# Patient Record
Sex: Female | Born: 1965 | Hispanic: Yes | Marital: Married | Smoking: Never smoker
Health system: Southern US, Community
[De-identification: ages and names within clinical notes are randomized; demographics above are authoritative.]

## PROBLEM LIST (undated history)

## (undated) DIAGNOSIS — K589 Irritable bowel syndrome without diarrhea: Secondary | ICD-10-CM

## (undated) DIAGNOSIS — G8929 Other chronic pain: Secondary | ICD-10-CM

## (undated) DIAGNOSIS — R109 Unspecified abdominal pain: Secondary | ICD-10-CM

## (undated) DIAGNOSIS — M5416 Radiculopathy, lumbar region: Secondary | ICD-10-CM

## (undated) DIAGNOSIS — C3490 Malignant neoplasm of unspecified part of unspecified bronchus or lung: Secondary | ICD-10-CM

## (undated) DIAGNOSIS — IMO0002 Reserved for concepts with insufficient information to code with codable children: Secondary | ICD-10-CM

## (undated) DIAGNOSIS — R0600 Dyspnea, unspecified: Secondary | ICD-10-CM

## (undated) DIAGNOSIS — K279 Peptic ulcer, site unspecified, unspecified as acute or chronic, without hemorrhage or perforation: Secondary | ICD-10-CM

## (undated) DIAGNOSIS — E781 Pure hyperglyceridemia: Secondary | ICD-10-CM

## (undated) DIAGNOSIS — K219 Gastro-esophageal reflux disease without esophagitis: Secondary | ICD-10-CM

## (undated) DIAGNOSIS — F419 Anxiety disorder, unspecified: Secondary | ICD-10-CM

## (undated) DIAGNOSIS — E876 Hypokalemia: Secondary | ICD-10-CM

## (undated) DIAGNOSIS — I10 Essential (primary) hypertension: Secondary | ICD-10-CM

## (undated) DIAGNOSIS — R079 Chest pain, unspecified: Secondary | ICD-10-CM

## (undated) DIAGNOSIS — F32A Depression, unspecified: Secondary | ICD-10-CM

## (undated) DIAGNOSIS — G43909 Migraine, unspecified, not intractable, without status migrainosus: Secondary | ICD-10-CM

## (undated) DIAGNOSIS — M549 Dorsalgia, unspecified: Secondary | ICD-10-CM

## (undated) DIAGNOSIS — F329 Major depressive disorder, single episode, unspecified: Secondary | ICD-10-CM

## (undated) DIAGNOSIS — I639 Cerebral infarction, unspecified: Secondary | ICD-10-CM

## (undated) HISTORY — DX: Pure hyperglyceridemia: E78.1

## (undated) HISTORY — DX: Radiculopathy, lumbar region: M54.16

## (undated) HISTORY — DX: Reserved for concepts with insufficient information to code with codable children: IMO0002

## (undated) HISTORY — DX: Irritable bowel syndrome, unspecified: K58.9

## (undated) HISTORY — DX: Chest pain, unspecified: R07.9

## (undated) HISTORY — DX: Unspecified abdominal pain: R10.9

## (undated) HISTORY — DX: Other chronic pain: G89.29

## (undated) HISTORY — DX: Gastro-esophageal reflux disease without esophagitis: K21.9

## (undated) HISTORY — DX: Anxiety disorder, unspecified: F41.9

## (undated) HISTORY — DX: Essential (primary) hypertension: I10

## (undated) HISTORY — DX: Depression, unspecified: F32.A

## (undated) HISTORY — PX: ABDOMINAL HYSTERECTOMY: SHX81

## (undated) HISTORY — DX: Peptic ulcer, site unspecified, unspecified as acute or chronic, without hemorrhage or perforation: K27.9

## (undated) HISTORY — DX: Hypokalemia: E87.6

## (undated) HISTORY — DX: Major depressive disorder, single episode, unspecified: F32.9

---

## 1997-12-23 ENCOUNTER — Emergency Department (HOSPITAL_COMMUNITY): Admission: EM | Admit: 1997-12-23 | Discharge: 1997-12-23 | Payer: Self-pay | Admitting: Emergency Medicine

## 1998-12-07 ENCOUNTER — Emergency Department (HOSPITAL_COMMUNITY): Admission: EM | Admit: 1998-12-07 | Discharge: 1998-12-07 | Payer: Self-pay | Admitting: Emergency Medicine

## 1998-12-19 ENCOUNTER — Emergency Department (HOSPITAL_COMMUNITY): Admission: EM | Admit: 1998-12-19 | Discharge: 1998-12-19 | Payer: Self-pay | Admitting: Emergency Medicine

## 1999-01-12 ENCOUNTER — Emergency Department (HOSPITAL_COMMUNITY): Admission: EM | Admit: 1999-01-12 | Discharge: 1999-01-12 | Payer: Self-pay | Admitting: Emergency Medicine

## 1999-01-12 ENCOUNTER — Encounter: Payer: Self-pay | Admitting: Emergency Medicine

## 1999-02-16 ENCOUNTER — Emergency Department (HOSPITAL_COMMUNITY): Admission: EM | Admit: 1999-02-16 | Discharge: 1999-02-16 | Payer: Self-pay | Admitting: Emergency Medicine

## 1999-02-17 ENCOUNTER — Encounter: Admission: RE | Admit: 1999-02-17 | Discharge: 1999-02-17 | Payer: Self-pay | Admitting: *Deleted

## 1999-03-05 ENCOUNTER — Encounter: Admission: RE | Admit: 1999-03-05 | Discharge: 1999-03-05 | Payer: Self-pay | Admitting: Hematology and Oncology

## 1999-03-20 ENCOUNTER — Ambulatory Visit (HOSPITAL_COMMUNITY): Admission: RE | Admit: 1999-03-20 | Discharge: 1999-03-20 | Payer: Self-pay | Admitting: *Deleted

## 1999-03-21 ENCOUNTER — Emergency Department (HOSPITAL_COMMUNITY): Admission: EM | Admit: 1999-03-21 | Discharge: 1999-03-21 | Payer: Self-pay | Admitting: Emergency Medicine

## 1999-03-27 ENCOUNTER — Encounter: Admission: RE | Admit: 1999-03-27 | Discharge: 1999-03-27 | Payer: Self-pay | Admitting: Internal Medicine

## 1999-04-18 ENCOUNTER — Emergency Department (HOSPITAL_COMMUNITY): Admission: EM | Admit: 1999-04-18 | Discharge: 1999-04-18 | Payer: Self-pay | Admitting: Emergency Medicine

## 1999-06-02 ENCOUNTER — Encounter: Admission: RE | Admit: 1999-06-02 | Discharge: 1999-06-02 | Payer: Self-pay | Admitting: Internal Medicine

## 1999-07-09 ENCOUNTER — Encounter: Admission: RE | Admit: 1999-07-09 | Discharge: 1999-07-09 | Payer: Self-pay | Admitting: Internal Medicine

## 1999-08-06 ENCOUNTER — Emergency Department (HOSPITAL_COMMUNITY): Admission: EM | Admit: 1999-08-06 | Discharge: 1999-08-06 | Payer: Self-pay | Admitting: *Deleted

## 1999-08-06 ENCOUNTER — Encounter: Payer: Self-pay | Admitting: *Deleted

## 1999-08-07 ENCOUNTER — Encounter: Payer: Self-pay | Admitting: *Deleted

## 1999-08-30 ENCOUNTER — Encounter: Payer: Self-pay | Admitting: Emergency Medicine

## 1999-08-30 ENCOUNTER — Emergency Department (HOSPITAL_COMMUNITY): Admission: EM | Admit: 1999-08-30 | Discharge: 1999-08-30 | Payer: Self-pay | Admitting: Emergency Medicine

## 1999-09-07 ENCOUNTER — Encounter: Admission: RE | Admit: 1999-09-07 | Discharge: 1999-09-07 | Payer: Self-pay | Admitting: Internal Medicine

## 1999-09-22 ENCOUNTER — Encounter: Admission: RE | Admit: 1999-09-22 | Discharge: 1999-09-22 | Payer: Self-pay | Admitting: *Deleted

## 1999-10-05 ENCOUNTER — Emergency Department (HOSPITAL_COMMUNITY): Admission: EM | Admit: 1999-10-05 | Discharge: 1999-10-05 | Payer: Self-pay | Admitting: Emergency Medicine

## 1999-10-08 ENCOUNTER — Encounter: Admission: RE | Admit: 1999-10-08 | Discharge: 1999-10-08 | Payer: Self-pay | Admitting: Hematology and Oncology

## 1999-11-05 ENCOUNTER — Encounter: Admission: RE | Admit: 1999-11-05 | Discharge: 1999-11-05 | Payer: Self-pay | Admitting: Internal Medicine

## 1999-11-24 ENCOUNTER — Encounter: Admission: RE | Admit: 1999-11-24 | Discharge: 1999-11-24 | Payer: Self-pay | Admitting: Obstetrics

## 1999-12-21 ENCOUNTER — Encounter: Admission: RE | Admit: 1999-12-21 | Discharge: 1999-12-21 | Payer: Self-pay | Admitting: Internal Medicine

## 1999-12-31 ENCOUNTER — Encounter: Admission: RE | Admit: 1999-12-31 | Discharge: 1999-12-31 | Payer: Self-pay | Admitting: Internal Medicine

## 2000-01-08 ENCOUNTER — Ambulatory Visit (HOSPITAL_COMMUNITY): Admission: RE | Admit: 2000-01-08 | Discharge: 2000-01-08 | Payer: Self-pay | Admitting: Internal Medicine

## 2000-01-08 ENCOUNTER — Encounter: Payer: Self-pay | Admitting: Internal Medicine

## 2000-01-25 ENCOUNTER — Encounter: Admission: RE | Admit: 2000-01-25 | Discharge: 2000-01-25 | Payer: Self-pay | Admitting: Internal Medicine

## 2000-03-07 ENCOUNTER — Encounter: Admission: RE | Admit: 2000-03-07 | Discharge: 2000-03-07 | Payer: Self-pay | Admitting: Internal Medicine

## 2000-05-09 ENCOUNTER — Encounter: Admission: RE | Admit: 2000-05-09 | Discharge: 2000-05-09 | Payer: Self-pay | Admitting: Internal Medicine

## 2000-07-19 ENCOUNTER — Encounter: Admission: RE | Admit: 2000-07-19 | Discharge: 2000-07-19 | Payer: Self-pay | Admitting: Hematology and Oncology

## 2000-08-17 ENCOUNTER — Encounter: Admission: RE | Admit: 2000-08-17 | Discharge: 2000-08-17 | Payer: Self-pay | Admitting: Internal Medicine

## 2000-09-04 ENCOUNTER — Encounter: Payer: Self-pay | Admitting: Emergency Medicine

## 2000-09-04 ENCOUNTER — Emergency Department (HOSPITAL_COMMUNITY): Admission: EM | Admit: 2000-09-04 | Discharge: 2000-09-04 | Payer: Self-pay | Admitting: Emergency Medicine

## 2000-09-27 ENCOUNTER — Encounter: Admission: RE | Admit: 2000-09-27 | Discharge: 2000-09-27 | Payer: Self-pay | Admitting: Obstetrics & Gynecology

## 2000-10-12 ENCOUNTER — Encounter: Admission: RE | Admit: 2000-10-12 | Discharge: 2000-10-12 | Payer: Self-pay | Admitting: Internal Medicine

## 2000-11-11 ENCOUNTER — Encounter: Admission: RE | Admit: 2000-11-11 | Discharge: 2000-11-11 | Payer: Self-pay | Admitting: Internal Medicine

## 2000-11-16 ENCOUNTER — Encounter (INDEPENDENT_AMBULATORY_CARE_PROVIDER_SITE_OTHER): Payer: Self-pay | Admitting: Internal Medicine

## 2000-11-16 ENCOUNTER — Encounter: Admission: RE | Admit: 2000-11-16 | Discharge: 2000-11-16 | Payer: Self-pay | Admitting: *Deleted

## 2000-11-16 ENCOUNTER — Other Ambulatory Visit: Admission: RE | Admit: 2000-11-16 | Discharge: 2000-11-16 | Payer: Self-pay | Admitting: *Deleted

## 2000-11-16 ENCOUNTER — Encounter (INDEPENDENT_AMBULATORY_CARE_PROVIDER_SITE_OTHER): Payer: Self-pay | Admitting: Specialist

## 2000-12-15 ENCOUNTER — Encounter: Admission: RE | Admit: 2000-12-15 | Discharge: 2000-12-15 | Payer: Self-pay | Admitting: *Deleted

## 2001-01-09 ENCOUNTER — Encounter: Admission: RE | Admit: 2001-01-09 | Discharge: 2001-01-09 | Payer: Self-pay | Admitting: Internal Medicine

## 2001-02-28 ENCOUNTER — Encounter: Admission: RE | Admit: 2001-02-28 | Discharge: 2001-02-28 | Payer: Self-pay | Admitting: Internal Medicine

## 2001-03-29 ENCOUNTER — Emergency Department (HOSPITAL_COMMUNITY): Admission: EM | Admit: 2001-03-29 | Discharge: 2001-03-30 | Payer: Self-pay | Admitting: Emergency Medicine

## 2001-04-03 ENCOUNTER — Encounter: Admission: RE | Admit: 2001-04-03 | Discharge: 2001-04-03 | Payer: Self-pay

## 2001-04-26 ENCOUNTER — Encounter: Admission: RE | Admit: 2001-04-26 | Discharge: 2001-04-26 | Payer: Self-pay | Admitting: Internal Medicine

## 2001-08-25 ENCOUNTER — Encounter: Admission: RE | Admit: 2001-08-25 | Discharge: 2001-08-25 | Payer: Self-pay

## 2001-08-27 ENCOUNTER — Emergency Department (HOSPITAL_COMMUNITY): Admission: EM | Admit: 2001-08-27 | Discharge: 2001-08-28 | Payer: Self-pay | Admitting: Emergency Medicine

## 2001-09-29 ENCOUNTER — Encounter: Admission: RE | Admit: 2001-09-29 | Discharge: 2001-09-29 | Payer: Self-pay | Admitting: Internal Medicine

## 2002-07-02 ENCOUNTER — Encounter: Admission: RE | Admit: 2002-07-02 | Discharge: 2002-07-02 | Payer: Self-pay | Admitting: Internal Medicine

## 2002-08-08 ENCOUNTER — Emergency Department (HOSPITAL_COMMUNITY): Admission: EM | Admit: 2002-08-08 | Discharge: 2002-08-09 | Payer: Self-pay | Admitting: Emergency Medicine

## 2002-09-09 ENCOUNTER — Emergency Department (HOSPITAL_COMMUNITY): Admission: EM | Admit: 2002-09-09 | Discharge: 2002-09-09 | Payer: Self-pay | Admitting: Emergency Medicine

## 2002-10-03 ENCOUNTER — Encounter: Admission: RE | Admit: 2002-10-03 | Discharge: 2002-10-03 | Payer: Self-pay | Admitting: Internal Medicine

## 2002-11-16 ENCOUNTER — Encounter: Admission: RE | Admit: 2002-11-16 | Discharge: 2002-11-16 | Payer: Self-pay | Admitting: Internal Medicine

## 2003-01-03 ENCOUNTER — Encounter: Admission: RE | Admit: 2003-01-03 | Discharge: 2003-01-03 | Payer: Self-pay | Admitting: Obstetrics and Gynecology

## 2003-01-11 ENCOUNTER — Ambulatory Visit (HOSPITAL_COMMUNITY): Admission: RE | Admit: 2003-01-11 | Discharge: 2003-01-11 | Payer: Self-pay | Admitting: *Deleted

## 2003-01-18 ENCOUNTER — Emergency Department (HOSPITAL_COMMUNITY): Admission: EM | Admit: 2003-01-18 | Discharge: 2003-01-18 | Payer: Self-pay | Admitting: Emergency Medicine

## 2003-02-23 ENCOUNTER — Emergency Department (HOSPITAL_COMMUNITY): Admission: EM | Admit: 2003-02-23 | Discharge: 2003-02-23 | Payer: Self-pay | Admitting: Emergency Medicine

## 2003-02-23 ENCOUNTER — Encounter: Payer: Self-pay | Admitting: Emergency Medicine

## 2003-03-01 ENCOUNTER — Emergency Department (HOSPITAL_COMMUNITY): Admission: EM | Admit: 2003-03-01 | Discharge: 2003-03-02 | Payer: Self-pay | Admitting: Emergency Medicine

## 2003-03-05 ENCOUNTER — Encounter: Admission: RE | Admit: 2003-03-05 | Discharge: 2003-03-05 | Payer: Self-pay | Admitting: Obstetrics and Gynecology

## 2003-05-01 ENCOUNTER — Encounter: Admission: RE | Admit: 2003-05-01 | Discharge: 2003-05-01 | Payer: Self-pay | Admitting: Internal Medicine

## 2003-05-15 ENCOUNTER — Encounter: Admission: RE | Admit: 2003-05-15 | Discharge: 2003-05-15 | Payer: Self-pay | Admitting: Internal Medicine

## 2003-05-20 ENCOUNTER — Emergency Department (HOSPITAL_COMMUNITY): Admission: EM | Admit: 2003-05-20 | Discharge: 2003-05-20 | Payer: Self-pay | Admitting: Emergency Medicine

## 2003-06-22 ENCOUNTER — Emergency Department (HOSPITAL_COMMUNITY): Admission: EM | Admit: 2003-06-22 | Discharge: 2003-06-23 | Payer: Self-pay | Admitting: Emergency Medicine

## 2003-06-28 ENCOUNTER — Encounter: Admission: RE | Admit: 2003-06-28 | Discharge: 2003-06-28 | Payer: Self-pay | Admitting: Internal Medicine

## 2003-07-08 ENCOUNTER — Ambulatory Visit (HOSPITAL_COMMUNITY): Admission: RE | Admit: 2003-07-08 | Discharge: 2003-07-08 | Payer: Self-pay | Admitting: Internal Medicine

## 2003-08-18 ENCOUNTER — Emergency Department (HOSPITAL_COMMUNITY): Admission: EM | Admit: 2003-08-18 | Discharge: 2003-08-18 | Payer: Self-pay | Admitting: Emergency Medicine

## 2003-09-12 ENCOUNTER — Encounter: Admission: RE | Admit: 2003-09-12 | Discharge: 2003-09-12 | Payer: Self-pay | Admitting: Internal Medicine

## 2003-09-12 ENCOUNTER — Encounter (INDEPENDENT_AMBULATORY_CARE_PROVIDER_SITE_OTHER): Payer: Self-pay | Admitting: Specialist

## 2003-09-12 ENCOUNTER — Other Ambulatory Visit: Admission: RE | Admit: 2003-09-12 | Discharge: 2003-09-12 | Payer: Self-pay | Admitting: Internal Medicine

## 2003-09-22 ENCOUNTER — Emergency Department (HOSPITAL_COMMUNITY): Admission: EM | Admit: 2003-09-22 | Discharge: 2003-09-23 | Payer: Self-pay | Admitting: Emergency Medicine

## 2003-10-17 ENCOUNTER — Emergency Department (HOSPITAL_COMMUNITY): Admission: EM | Admit: 2003-10-17 | Discharge: 2003-10-17 | Payer: Self-pay | Admitting: Emergency Medicine

## 2003-10-24 ENCOUNTER — Inpatient Hospital Stay (HOSPITAL_COMMUNITY): Admission: RE | Admit: 2003-10-24 | Discharge: 2003-11-04 | Payer: Self-pay | Admitting: Psychiatry

## 2003-11-08 ENCOUNTER — Encounter: Admission: RE | Admit: 2003-11-08 | Discharge: 2003-11-08 | Payer: Self-pay | Admitting: Internal Medicine

## 2003-11-29 ENCOUNTER — Encounter: Admission: RE | Admit: 2003-11-29 | Discharge: 2003-11-29 | Payer: Self-pay | Admitting: Internal Medicine

## 2003-12-02 ENCOUNTER — Emergency Department (HOSPITAL_COMMUNITY): Admission: EM | Admit: 2003-12-02 | Discharge: 2003-12-03 | Payer: Self-pay | Admitting: Physical Therapy

## 2004-01-22 ENCOUNTER — Encounter: Admission: RE | Admit: 2004-01-22 | Discharge: 2004-01-22 | Payer: Self-pay | Admitting: Internal Medicine

## 2004-03-16 ENCOUNTER — Emergency Department (HOSPITAL_COMMUNITY): Admission: EM | Admit: 2004-03-16 | Discharge: 2004-03-17 | Payer: Self-pay | Admitting: *Deleted

## 2004-04-08 ENCOUNTER — Emergency Department (HOSPITAL_COMMUNITY): Admission: EM | Admit: 2004-04-08 | Discharge: 2004-04-09 | Payer: Self-pay | Admitting: Emergency Medicine

## 2004-04-27 ENCOUNTER — Ambulatory Visit: Payer: Self-pay | Admitting: Internal Medicine

## 2004-05-29 ENCOUNTER — Ambulatory Visit: Payer: Self-pay | Admitting: Internal Medicine

## 2004-06-30 ENCOUNTER — Emergency Department (HOSPITAL_COMMUNITY): Admission: EM | Admit: 2004-06-30 | Discharge: 2004-06-30 | Payer: Self-pay

## 2004-07-08 ENCOUNTER — Ambulatory Visit: Payer: Self-pay | Admitting: Internal Medicine

## 2004-07-11 ENCOUNTER — Ambulatory Visit: Payer: Self-pay | Admitting: Internal Medicine

## 2004-07-11 ENCOUNTER — Inpatient Hospital Stay (HOSPITAL_COMMUNITY): Admission: EM | Admit: 2004-07-11 | Discharge: 2004-07-13 | Payer: Self-pay | Admitting: Emergency Medicine

## 2004-07-28 ENCOUNTER — Ambulatory Visit: Payer: Self-pay | Admitting: Obstetrics and Gynecology

## 2004-07-29 ENCOUNTER — Ambulatory Visit: Payer: Self-pay | Admitting: Internal Medicine

## 2004-08-03 ENCOUNTER — Emergency Department (HOSPITAL_COMMUNITY): Admission: EM | Admit: 2004-08-03 | Discharge: 2004-08-03 | Payer: Self-pay | Admitting: Emergency Medicine

## 2004-08-10 ENCOUNTER — Ambulatory Visit (HOSPITAL_COMMUNITY): Admission: RE | Admit: 2004-08-10 | Discharge: 2004-08-10 | Payer: Self-pay | Admitting: General Surgery

## 2004-08-11 ENCOUNTER — Ambulatory Visit (HOSPITAL_COMMUNITY): Admission: RE | Admit: 2004-08-11 | Discharge: 2004-08-11 | Payer: Self-pay | Admitting: Internal Medicine

## 2004-08-12 ENCOUNTER — Encounter (INDEPENDENT_AMBULATORY_CARE_PROVIDER_SITE_OTHER): Payer: Self-pay | Admitting: Specialist

## 2004-08-12 ENCOUNTER — Ambulatory Visit: Payer: Self-pay | Admitting: Obstetrics and Gynecology

## 2004-08-12 ENCOUNTER — Ambulatory Visit (HOSPITAL_COMMUNITY): Admission: RE | Admit: 2004-08-12 | Discharge: 2004-08-12 | Payer: Self-pay | Admitting: Obstetrics and Gynecology

## 2004-08-17 ENCOUNTER — Emergency Department (HOSPITAL_COMMUNITY): Admission: EM | Admit: 2004-08-17 | Discharge: 2004-08-17 | Payer: Self-pay | Admitting: Emergency Medicine

## 2004-08-27 ENCOUNTER — Ambulatory Visit: Payer: Self-pay | Admitting: Obstetrics and Gynecology

## 2004-09-15 ENCOUNTER — Ambulatory Visit: Payer: Self-pay | Admitting: Internal Medicine

## 2004-09-16 ENCOUNTER — Emergency Department (HOSPITAL_COMMUNITY): Admission: EM | Admit: 2004-09-16 | Discharge: 2004-09-16 | Payer: Self-pay | Admitting: Emergency Medicine

## 2004-10-05 ENCOUNTER — Emergency Department (HOSPITAL_COMMUNITY): Admission: EM | Admit: 2004-10-05 | Discharge: 2004-10-05 | Payer: Self-pay | Admitting: Emergency Medicine

## 2004-10-13 ENCOUNTER — Emergency Department (HOSPITAL_COMMUNITY): Admission: EM | Admit: 2004-10-13 | Discharge: 2004-10-13 | Payer: Self-pay | Admitting: Emergency Medicine

## 2004-10-19 ENCOUNTER — Inpatient Hospital Stay (HOSPITAL_COMMUNITY): Admission: AD | Admit: 2004-10-19 | Discharge: 2004-10-19 | Payer: Self-pay | Admitting: Obstetrics & Gynecology

## 2004-10-20 ENCOUNTER — Ambulatory Visit: Payer: Self-pay | Admitting: Internal Medicine

## 2004-10-20 ENCOUNTER — Ambulatory Visit: Payer: Self-pay | Admitting: Obstetrics and Gynecology

## 2004-10-23 ENCOUNTER — Inpatient Hospital Stay (HOSPITAL_COMMUNITY): Admission: EM | Admit: 2004-10-23 | Discharge: 2004-10-25 | Payer: Self-pay | Admitting: Emergency Medicine

## 2004-10-25 ENCOUNTER — Inpatient Hospital Stay (HOSPITAL_COMMUNITY): Admission: EM | Admit: 2004-10-25 | Discharge: 2004-10-28 | Payer: Self-pay | Admitting: Psychiatry

## 2004-10-25 ENCOUNTER — Ambulatory Visit: Payer: Self-pay | Admitting: Psychiatry

## 2004-11-13 ENCOUNTER — Emergency Department (HOSPITAL_COMMUNITY): Admission: EM | Admit: 2004-11-13 | Discharge: 2004-11-13 | Payer: Self-pay | Admitting: Emergency Medicine

## 2004-11-17 ENCOUNTER — Emergency Department (HOSPITAL_COMMUNITY): Admission: EM | Admit: 2004-11-17 | Discharge: 2004-11-17 | Payer: Self-pay | Admitting: Emergency Medicine

## 2004-11-20 ENCOUNTER — Ambulatory Visit: Payer: Self-pay | Admitting: Internal Medicine

## 2004-11-24 ENCOUNTER — Encounter (INDEPENDENT_AMBULATORY_CARE_PROVIDER_SITE_OTHER): Payer: Self-pay | Admitting: Internal Medicine

## 2004-11-30 ENCOUNTER — Ambulatory Visit: Payer: Self-pay | Admitting: Internal Medicine

## 2004-12-04 ENCOUNTER — Ambulatory Visit: Payer: Self-pay | Admitting: Internal Medicine

## 2004-12-08 ENCOUNTER — Emergency Department (HOSPITAL_COMMUNITY): Admission: EM | Admit: 2004-12-08 | Discharge: 2004-12-09 | Payer: Self-pay | Admitting: *Deleted

## 2004-12-16 ENCOUNTER — Inpatient Hospital Stay (HOSPITAL_COMMUNITY): Admission: AD | Admit: 2004-12-16 | Discharge: 2004-12-16 | Payer: Self-pay | Admitting: Family Medicine

## 2004-12-28 ENCOUNTER — Emergency Department (HOSPITAL_COMMUNITY): Admission: EM | Admit: 2004-12-28 | Discharge: 2004-12-29 | Payer: Self-pay | Admitting: Emergency Medicine

## 2005-01-05 ENCOUNTER — Ambulatory Visit: Payer: Self-pay | Admitting: Obstetrics and Gynecology

## 2005-01-14 ENCOUNTER — Emergency Department (HOSPITAL_COMMUNITY): Admission: EM | Admit: 2005-01-14 | Discharge: 2005-01-15 | Payer: Self-pay | Admitting: Emergency Medicine

## 2005-01-15 ENCOUNTER — Ambulatory Visit: Payer: Self-pay | Admitting: Internal Medicine

## 2005-01-18 ENCOUNTER — Ambulatory Visit: Payer: Self-pay | Admitting: Internal Medicine

## 2005-01-20 ENCOUNTER — Emergency Department (HOSPITAL_COMMUNITY): Admission: EM | Admit: 2005-01-20 | Discharge: 2005-01-20 | Payer: Self-pay | Admitting: Emergency Medicine

## 2005-01-26 ENCOUNTER — Ambulatory Visit: Payer: Self-pay | Admitting: Internal Medicine

## 2005-02-03 ENCOUNTER — Emergency Department (HOSPITAL_COMMUNITY): Admission: EM | Admit: 2005-02-03 | Discharge: 2005-02-04 | Payer: Self-pay | Admitting: Emergency Medicine

## 2005-02-05 ENCOUNTER — Emergency Department (HOSPITAL_COMMUNITY): Admission: EM | Admit: 2005-02-05 | Discharge: 2005-02-06 | Payer: Self-pay | Admitting: Nurse Practitioner

## 2005-02-12 ENCOUNTER — Emergency Department (HOSPITAL_COMMUNITY): Admission: EM | Admit: 2005-02-12 | Discharge: 2005-02-12 | Payer: Self-pay | Admitting: Emergency Medicine

## 2005-02-17 ENCOUNTER — Ambulatory Visit (HOSPITAL_COMMUNITY): Admission: RE | Admit: 2005-02-17 | Discharge: 2005-02-17 | Payer: Self-pay | Admitting: Gastroenterology

## 2005-02-23 ENCOUNTER — Emergency Department (HOSPITAL_COMMUNITY): Admission: EM | Admit: 2005-02-23 | Discharge: 2005-02-23 | Payer: Self-pay | Admitting: *Deleted

## 2005-02-25 ENCOUNTER — Ambulatory Visit: Payer: Self-pay | Admitting: Internal Medicine

## 2005-02-26 ENCOUNTER — Ambulatory Visit (HOSPITAL_COMMUNITY): Admission: RE | Admit: 2005-02-26 | Discharge: 2005-02-26 | Payer: Self-pay | Admitting: Pulmonary Disease

## 2005-03-01 ENCOUNTER — Emergency Department (HOSPITAL_COMMUNITY): Admission: EM | Admit: 2005-03-01 | Discharge: 2005-03-02 | Payer: Self-pay | Admitting: Emergency Medicine

## 2005-03-02 ENCOUNTER — Ambulatory Visit: Payer: Self-pay | Admitting: Internal Medicine

## 2005-03-11 ENCOUNTER — Ambulatory Visit: Payer: Self-pay | Admitting: Internal Medicine

## 2005-03-13 ENCOUNTER — Emergency Department (HOSPITAL_COMMUNITY): Admission: EM | Admit: 2005-03-13 | Discharge: 2005-03-13 | Payer: Self-pay | Admitting: *Deleted

## 2005-03-15 ENCOUNTER — Ambulatory Visit: Payer: Self-pay | Admitting: Internal Medicine

## 2005-03-22 ENCOUNTER — Ambulatory Visit: Payer: Self-pay | Admitting: Obstetrics and Gynecology

## 2005-03-22 ENCOUNTER — Inpatient Hospital Stay (HOSPITAL_COMMUNITY): Admission: RE | Admit: 2005-03-22 | Discharge: 2005-03-25 | Payer: Self-pay | Admitting: Obstetrics and Gynecology

## 2005-03-22 ENCOUNTER — Encounter (INDEPENDENT_AMBULATORY_CARE_PROVIDER_SITE_OTHER): Payer: Self-pay | Admitting: Specialist

## 2005-04-08 ENCOUNTER — Ambulatory Visit: Payer: Self-pay | Admitting: *Deleted

## 2005-04-08 ENCOUNTER — Ambulatory Visit: Payer: Self-pay | Admitting: Internal Medicine

## 2005-04-21 ENCOUNTER — Inpatient Hospital Stay (HOSPITAL_COMMUNITY): Admission: AD | Admit: 2005-04-21 | Discharge: 2005-04-21 | Payer: Self-pay | Admitting: Family Medicine

## 2005-04-21 ENCOUNTER — Emergency Department (HOSPITAL_COMMUNITY): Admission: EM | Admit: 2005-04-21 | Discharge: 2005-04-21 | Payer: Self-pay | Admitting: Family Medicine

## 2005-04-22 ENCOUNTER — Emergency Department (HOSPITAL_COMMUNITY): Admission: EM | Admit: 2005-04-22 | Discharge: 2005-04-22 | Payer: Self-pay | Admitting: *Deleted

## 2005-04-27 ENCOUNTER — Ambulatory Visit: Payer: Self-pay | Admitting: Internal Medicine

## 2005-04-28 ENCOUNTER — Ambulatory Visit (HOSPITAL_COMMUNITY): Admission: RE | Admit: 2005-04-28 | Discharge: 2005-04-28 | Payer: Self-pay | Admitting: Internal Medicine

## 2005-05-05 ENCOUNTER — Ambulatory Visit (HOSPITAL_COMMUNITY): Admission: RE | Admit: 2005-05-05 | Discharge: 2005-05-05 | Payer: Self-pay | Admitting: Gastroenterology

## 2005-05-10 ENCOUNTER — Ambulatory Visit (HOSPITAL_COMMUNITY): Admission: RE | Admit: 2005-05-10 | Discharge: 2005-05-10 | Payer: Self-pay | Admitting: Gastroenterology

## 2005-05-11 ENCOUNTER — Ambulatory Visit: Payer: Self-pay | Admitting: Internal Medicine

## 2005-05-20 ENCOUNTER — Emergency Department (HOSPITAL_COMMUNITY): Admission: EM | Admit: 2005-05-20 | Discharge: 2005-05-20 | Payer: Self-pay | Admitting: Emergency Medicine

## 2005-05-20 ENCOUNTER — Ambulatory Visit: Payer: Self-pay | Admitting: Obstetrics and Gynecology

## 2005-06-11 ENCOUNTER — Emergency Department (HOSPITAL_COMMUNITY): Admission: EM | Admit: 2005-06-11 | Discharge: 2005-06-11 | Payer: Self-pay | Admitting: Emergency Medicine

## 2005-06-28 ENCOUNTER — Ambulatory Visit: Payer: Self-pay | Admitting: Hospitalist

## 2005-07-09 ENCOUNTER — Emergency Department (HOSPITAL_COMMUNITY): Admission: EM | Admit: 2005-07-09 | Discharge: 2005-07-10 | Payer: Self-pay | Admitting: Emergency Medicine

## 2005-07-29 ENCOUNTER — Ambulatory Visit: Payer: Self-pay | Admitting: Internal Medicine

## 2005-08-10 ENCOUNTER — Emergency Department (HOSPITAL_COMMUNITY): Admission: EM | Admit: 2005-08-10 | Discharge: 2005-08-11 | Payer: Self-pay | Admitting: Emergency Medicine

## 2005-08-23 ENCOUNTER — Encounter (INDEPENDENT_AMBULATORY_CARE_PROVIDER_SITE_OTHER): Payer: Self-pay | Admitting: Internal Medicine

## 2005-08-23 ENCOUNTER — Ambulatory Visit: Payer: Self-pay | Admitting: Internal Medicine

## 2005-09-01 ENCOUNTER — Emergency Department (HOSPITAL_COMMUNITY): Admission: EM | Admit: 2005-09-01 | Discharge: 2005-09-01 | Payer: Self-pay | Admitting: Emergency Medicine

## 2005-09-07 ENCOUNTER — Emergency Department (HOSPITAL_COMMUNITY): Admission: EM | Admit: 2005-09-07 | Discharge: 2005-09-08 | Payer: Self-pay | Admitting: Emergency Medicine

## 2005-09-20 ENCOUNTER — Emergency Department (HOSPITAL_COMMUNITY): Admission: EM | Admit: 2005-09-20 | Discharge: 2005-09-20 | Payer: Self-pay | Admitting: Emergency Medicine

## 2005-10-04 ENCOUNTER — Inpatient Hospital Stay (HOSPITAL_COMMUNITY): Admission: AD | Admit: 2005-10-04 | Discharge: 2005-10-05 | Payer: Self-pay | Admitting: Psychiatry

## 2005-10-05 ENCOUNTER — Ambulatory Visit: Payer: Self-pay | Admitting: Psychiatry

## 2005-10-13 ENCOUNTER — Ambulatory Visit: Payer: Self-pay | Admitting: Internal Medicine

## 2006-03-29 DIAGNOSIS — G4700 Insomnia, unspecified: Secondary | ICD-10-CM

## 2006-03-29 DIAGNOSIS — Z87898 Personal history of other specified conditions: Secondary | ICD-10-CM

## 2006-03-29 DIAGNOSIS — F339 Major depressive disorder, recurrent, unspecified: Secondary | ICD-10-CM

## 2006-03-29 DIAGNOSIS — F419 Anxiety disorder, unspecified: Secondary | ICD-10-CM | POA: Insufficient documentation

## 2006-04-06 ENCOUNTER — Emergency Department (HOSPITAL_COMMUNITY): Admission: EM | Admit: 2006-04-06 | Discharge: 2006-04-06 | Payer: Self-pay | Admitting: Emergency Medicine

## 2006-06-25 ENCOUNTER — Emergency Department (HOSPITAL_COMMUNITY): Admission: EM | Admit: 2006-06-25 | Discharge: 2006-06-25 | Payer: Self-pay | Admitting: Emergency Medicine

## 2006-06-29 ENCOUNTER — Ambulatory Visit: Payer: Self-pay | Admitting: Internal Medicine

## 2006-06-29 DIAGNOSIS — T7411XA Adult physical abuse, confirmed, initial encounter: Secondary | ICD-10-CM | POA: Insufficient documentation

## 2006-07-07 ENCOUNTER — Emergency Department (HOSPITAL_COMMUNITY): Admission: EM | Admit: 2006-07-07 | Discharge: 2006-07-07 | Payer: Self-pay | Admitting: Family Medicine

## 2006-08-15 ENCOUNTER — Telehealth (INDEPENDENT_AMBULATORY_CARE_PROVIDER_SITE_OTHER): Payer: Self-pay | Admitting: Hospitalist

## 2006-08-15 ENCOUNTER — Telehealth: Payer: Self-pay | Admitting: *Deleted

## 2006-08-16 ENCOUNTER — Emergency Department (HOSPITAL_COMMUNITY): Admission: EM | Admit: 2006-08-16 | Discharge: 2006-08-16 | Payer: Self-pay | Admitting: Emergency Medicine

## 2006-08-22 ENCOUNTER — Encounter: Payer: Self-pay | Admitting: Licensed Clinical Social Worker

## 2006-08-22 ENCOUNTER — Ambulatory Visit: Payer: Self-pay | Admitting: *Deleted

## 2006-08-22 ENCOUNTER — Encounter (INDEPENDENT_AMBULATORY_CARE_PROVIDER_SITE_OTHER): Payer: Self-pay | Admitting: Unknown Physician Specialty

## 2006-09-08 ENCOUNTER — Emergency Department (HOSPITAL_COMMUNITY): Admission: EM | Admit: 2006-09-08 | Discharge: 2006-09-08 | Payer: Self-pay | Admitting: *Deleted

## 2006-09-12 ENCOUNTER — Emergency Department (HOSPITAL_COMMUNITY): Admission: EM | Admit: 2006-09-12 | Discharge: 2006-09-12 | Payer: Self-pay | Admitting: Emergency Medicine

## 2006-09-27 ENCOUNTER — Emergency Department (HOSPITAL_COMMUNITY): Admission: EM | Admit: 2006-09-27 | Discharge: 2006-09-27 | Payer: Self-pay | Admitting: Emergency Medicine

## 2006-09-30 ENCOUNTER — Ambulatory Visit: Payer: Self-pay | Admitting: Internal Medicine

## 2006-10-24 ENCOUNTER — Emergency Department (HOSPITAL_COMMUNITY): Admission: EM | Admit: 2006-10-24 | Discharge: 2006-10-25 | Payer: Self-pay | Admitting: Emergency Medicine

## 2006-10-27 ENCOUNTER — Telehealth (INDEPENDENT_AMBULATORY_CARE_PROVIDER_SITE_OTHER): Payer: Self-pay | Admitting: *Deleted

## 2006-11-24 ENCOUNTER — Emergency Department (HOSPITAL_COMMUNITY): Admission: EM | Admit: 2006-11-24 | Discharge: 2006-11-24 | Payer: Self-pay | Admitting: *Deleted

## 2006-11-29 ENCOUNTER — Telehealth: Payer: Self-pay | Admitting: *Deleted

## 2006-12-12 ENCOUNTER — Emergency Department (HOSPITAL_COMMUNITY): Admission: EM | Admit: 2006-12-12 | Discharge: 2006-12-12 | Payer: Self-pay | Admitting: Emergency Medicine

## 2006-12-20 ENCOUNTER — Emergency Department (HOSPITAL_COMMUNITY): Admission: EM | Admit: 2006-12-20 | Discharge: 2006-12-21 | Payer: Self-pay | Admitting: Emergency Medicine

## 2007-01-04 ENCOUNTER — Telehealth (INDEPENDENT_AMBULATORY_CARE_PROVIDER_SITE_OTHER): Payer: Self-pay | Admitting: *Deleted

## 2007-03-29 ENCOUNTER — Telehealth (INDEPENDENT_AMBULATORY_CARE_PROVIDER_SITE_OTHER): Payer: Self-pay | Admitting: Internal Medicine

## 2007-03-29 ENCOUNTER — Ambulatory Visit: Payer: Self-pay | Admitting: Internal Medicine

## 2007-11-16 ENCOUNTER — Telehealth (INDEPENDENT_AMBULATORY_CARE_PROVIDER_SITE_OTHER): Payer: Self-pay | Admitting: Pharmacy Technician

## 2008-10-22 ENCOUNTER — Emergency Department (HOSPITAL_COMMUNITY): Admission: EM | Admit: 2008-10-22 | Discharge: 2008-10-22 | Payer: Self-pay | Admitting: Family Medicine

## 2008-11-12 ENCOUNTER — Encounter: Payer: Self-pay | Admitting: Internal Medicine

## 2008-11-12 ENCOUNTER — Encounter (INDEPENDENT_AMBULATORY_CARE_PROVIDER_SITE_OTHER): Payer: Self-pay | Admitting: Internal Medicine

## 2008-11-12 ENCOUNTER — Ambulatory Visit: Payer: Self-pay | Admitting: *Deleted

## 2008-11-12 DIAGNOSIS — K279 Peptic ulcer, site unspecified, unspecified as acute or chronic, without hemorrhage or perforation: Secondary | ICD-10-CM | POA: Insufficient documentation

## 2008-11-12 LAB — CONVERTED CEMR LAB
CO2: 23 meq/L (ref 19–32)
Calcium: 9.3 mg/dL (ref 8.4–10.5)
GFR calc Af Amer: >60 mL/min (ref >60)
GFR calc non Af Amer: >60 mL/min (ref >60)
Glucose, Bld: 89 mg/dL (ref 70–99)
Hemoglobin: 12.7 g/dL (ref 12.0–15.0)
Platelets: 411 10*3/uL — ABNORMAL HIGH (ref 150–400)
RDW: 13.7 % (ref 11.5–15.5)
Sodium: 136 meq/L (ref 135–145)
Total Bilirubin: 0.4 mg/dL (ref 0.3–1.2)
Total Protein: 7.2 g/dL (ref 6.0–8.3)

## 2008-11-14 ENCOUNTER — Ambulatory Visit: Payer: Self-pay | Admitting: Internal Medicine

## 2008-11-14 ENCOUNTER — Encounter (INDEPENDENT_AMBULATORY_CARE_PROVIDER_SITE_OTHER): Payer: Self-pay | Admitting: Internal Medicine

## 2008-11-27 ENCOUNTER — Encounter: Payer: Self-pay | Admitting: Internal Medicine

## 2008-11-27 ENCOUNTER — Emergency Department (HOSPITAL_COMMUNITY): Admission: EM | Admit: 2008-11-27 | Discharge: 2008-11-27 | Payer: Self-pay | Admitting: Emergency Medicine

## 2008-11-29 ENCOUNTER — Ambulatory Visit (HOSPITAL_COMMUNITY): Admission: RE | Admit: 2008-11-29 | Discharge: 2008-11-29 | Payer: Self-pay | Admitting: Internal Medicine

## 2008-11-29 ENCOUNTER — Ambulatory Visit: Payer: Self-pay | Admitting: Internal Medicine

## 2008-12-09 ENCOUNTER — Ambulatory Visit: Payer: Self-pay | Admitting: Internal Medicine

## 2008-12-09 ENCOUNTER — Encounter: Payer: Self-pay | Admitting: Internal Medicine

## 2008-12-09 DIAGNOSIS — S2239XA Fracture of one rib, unspecified side, initial encounter for closed fracture: Secondary | ICD-10-CM

## 2008-12-10 ENCOUNTER — Encounter: Payer: Self-pay | Admitting: Internal Medicine

## 2008-12-12 ENCOUNTER — Encounter: Admission: RE | Admit: 2008-12-12 | Discharge: 2008-12-12 | Payer: Self-pay | Admitting: Internal Medicine

## 2008-12-12 LAB — HM MAMMOGRAPHY

## 2008-12-25 ENCOUNTER — Encounter: Payer: Self-pay | Admitting: Internal Medicine

## 2008-12-30 ENCOUNTER — Ambulatory Visit: Payer: Self-pay | Admitting: Internal Medicine

## 2008-12-30 ENCOUNTER — Encounter: Payer: Self-pay | Admitting: Internal Medicine

## 2008-12-30 LAB — CONVERTED CEMR LAB
BUN: 13 mg/dL (ref 6–23)
Bilirubin Urine: NEGATIVE
CO2: 24 meq/L (ref 19–32)
Calcium: 9.2 mg/dL (ref 8.4–10.5)
Chloride: 105 meq/L (ref 96–112)
Creatinine, Ser: 0.74 mg/dL (ref 0.40–1.20)
Eosinophils Relative: 1 % (ref 0–5)
Glucose, Bld: 100 mg/dL — ABNORMAL HIGH (ref 70–99)
Glucose, Urine, Semiquant: NEGATIVE
HCT: 36.6 % (ref 36.0–46.0)
Hemoglobin: 12.6 g/dL (ref 12.0–15.0)
Ketones, urine, test strip: NEGATIVE
Lymphocytes Relative: 34 % (ref 12–46)
Lymphs Abs: 3.1 10*3/uL (ref 0.7–4.0)
Monocytes Absolute: 0.5 10*3/uL (ref 0.1–1.0)
Monocytes Relative: 5 % (ref 3–12)
Platelets: 372 10*3/uL (ref 150–400)
RBC: 3.88 M/uL (ref 3.87–5.11)
Sodium: 136 meq/L (ref 135–145)
Specific Gravity, Urine: >=1.03
Urobilinogen, UA: 0.2
WBC: 9.1 10*3/uL (ref 4.0–10.5)
pH: 5.5

## 2009-01-06 ENCOUNTER — Encounter: Payer: Self-pay | Admitting: Internal Medicine

## 2009-01-15 ENCOUNTER — Emergency Department (HOSPITAL_COMMUNITY): Admission: EM | Admit: 2009-01-15 | Discharge: 2009-01-15 | Payer: Self-pay | Admitting: Emergency Medicine

## 2009-02-20 ENCOUNTER — Ambulatory Visit: Payer: Self-pay | Admitting: Internal Medicine

## 2009-02-20 LAB — CONVERTED CEMR LAB
Eosinophils Absolute: 0.2 10*3/uL (ref 0.0–0.7)
Leukocytes, UA: NEGATIVE
Lymphs Abs: 2.8 10*3/uL (ref 0.7–4.0)
MCV: 93.4 fL (ref >=78.0)
Monocytes Relative: 6 % (ref 3–12)
Neutrophils Relative %: 59 % (ref 43–77)
Nitrite: NEGATIVE
RBC: 3.93 M/uL (ref 3.87–5.11)
Specific Gravity, Urine: 1.005 (ref 1.005–1.0)
WBC: 8.4 10*3/uL (ref 4.0–10.5)
pH: 6.5 (ref 5.0–8.0)

## 2009-02-21 ENCOUNTER — Ambulatory Visit (HOSPITAL_COMMUNITY): Admission: RE | Admit: 2009-02-21 | Discharge: 2009-02-21 | Payer: Self-pay | Admitting: Infectious Diseases

## 2009-02-21 ENCOUNTER — Ambulatory Visit: Payer: Self-pay | Admitting: Infectious Disease

## 2009-02-21 ENCOUNTER — Encounter (INDEPENDENT_AMBULATORY_CARE_PROVIDER_SITE_OTHER): Payer: Self-pay | Admitting: Internal Medicine

## 2009-03-07 ENCOUNTER — Encounter (INDEPENDENT_AMBULATORY_CARE_PROVIDER_SITE_OTHER): Payer: Self-pay | Admitting: Internal Medicine

## 2009-03-07 ENCOUNTER — Ambulatory Visit: Payer: Self-pay | Admitting: Internal Medicine

## 2009-03-07 DIAGNOSIS — B353 Tinea pedis: Secondary | ICD-10-CM | POA: Insufficient documentation

## 2009-03-07 LAB — CONVERTED CEMR LAB
ALT: 15 units/L (ref 0–35)
AST: 17 units/L (ref 0–37)
Albumin: 4.8 g/dL (ref 3.5–5.2)
Calcium: 9.7 mg/dL (ref 8.4–10.5)
Chloride: 100 meq/L (ref 96–112)
Potassium: 4.3 meq/L (ref 3.5–5.3)

## 2009-03-21 ENCOUNTER — Ambulatory Visit: Payer: Self-pay | Admitting: Internal Medicine

## 2009-03-26 ENCOUNTER — Other Ambulatory Visit: Admission: RE | Admit: 2009-03-26 | Discharge: 2009-03-26 | Payer: Self-pay | Admitting: Obstetrics and Gynecology

## 2009-03-26 ENCOUNTER — Encounter: Payer: Self-pay | Admitting: Obstetrics and Gynecology

## 2009-03-26 ENCOUNTER — Ambulatory Visit: Payer: Self-pay | Admitting: Obstetrics and Gynecology

## 2009-04-08 ENCOUNTER — Ambulatory Visit: Payer: Self-pay | Admitting: Internal Medicine

## 2009-04-08 DIAGNOSIS — M549 Dorsalgia, unspecified: Secondary | ICD-10-CM

## 2009-04-08 DIAGNOSIS — M542 Cervicalgia: Secondary | ICD-10-CM | POA: Insufficient documentation

## 2009-04-08 DIAGNOSIS — K589 Irritable bowel syndrome without diarrhea: Secondary | ICD-10-CM | POA: Insufficient documentation

## 2009-04-08 DIAGNOSIS — G8929 Other chronic pain: Secondary | ICD-10-CM

## 2009-04-09 ENCOUNTER — Encounter: Payer: Self-pay | Admitting: Internal Medicine

## 2009-08-07 ENCOUNTER — Emergency Department (HOSPITAL_COMMUNITY): Admission: EM | Admit: 2009-08-07 | Discharge: 2009-08-08 | Payer: Self-pay | Admitting: Emergency Medicine

## 2009-10-14 ENCOUNTER — Ambulatory Visit (HOSPITAL_COMMUNITY): Admission: RE | Admit: 2009-10-14 | Discharge: 2009-10-14 | Payer: Self-pay | Admitting: Internal Medicine

## 2009-10-14 ENCOUNTER — Ambulatory Visit: Payer: Self-pay | Admitting: Internal Medicine

## 2009-10-14 ENCOUNTER — Encounter: Payer: Self-pay | Admitting: Internal Medicine

## 2009-10-14 DIAGNOSIS — R0789 Other chest pain: Secondary | ICD-10-CM

## 2009-10-16 ENCOUNTER — Encounter: Payer: Self-pay | Admitting: Internal Medicine

## 2009-10-17 ENCOUNTER — Ambulatory Visit: Payer: Self-pay | Admitting: Internal Medicine

## 2009-10-18 ENCOUNTER — Encounter: Payer: Self-pay | Admitting: Internal Medicine

## 2009-10-18 LAB — CONVERTED CEMR LAB
Total CHOL/HDL Ratio: 5.7
VLDL: 54 mg/dL — ABNORMAL HIGH (ref 0–40)

## 2009-10-24 ENCOUNTER — Encounter: Payer: Self-pay | Admitting: Internal Medicine

## 2009-10-27 ENCOUNTER — Telehealth: Payer: Self-pay | Admitting: *Deleted

## 2009-10-29 ENCOUNTER — Ambulatory Visit: Payer: Self-pay | Admitting: Cardiovascular Disease

## 2009-10-31 ENCOUNTER — Ambulatory Visit: Payer: Self-pay | Admitting: Internal Medicine

## 2009-11-20 ENCOUNTER — Ambulatory Visit: Payer: Self-pay

## 2009-11-20 ENCOUNTER — Ambulatory Visit: Payer: Self-pay | Admitting: Cardiovascular Disease

## 2009-11-20 ENCOUNTER — Ambulatory Visit (HOSPITAL_COMMUNITY): Admission: RE | Admit: 2009-11-20 | Discharge: 2009-11-20 | Payer: Self-pay | Admitting: Cardiovascular Disease

## 2009-12-01 ENCOUNTER — Ambulatory Visit: Payer: Self-pay | Admitting: Internal Medicine

## 2009-12-29 ENCOUNTER — Ambulatory Visit: Payer: Self-pay | Admitting: Internal Medicine

## 2009-12-30 DIAGNOSIS — E785 Hyperlipidemia, unspecified: Secondary | ICD-10-CM | POA: Insufficient documentation

## 2009-12-30 LAB — CONVERTED CEMR LAB
Cholesterol: 226 mg/dL — ABNORMAL HIGH (ref 0–200)
LDL Cholesterol: 132 mg/dL — ABNORMAL HIGH (ref 0–99)
Triglycerides: 271 mg/dL — ABNORMAL HIGH (ref <150)

## 2010-01-20 ENCOUNTER — Ambulatory Visit: Payer: Self-pay | Admitting: Internal Medicine

## 2010-01-20 LAB — CONVERTED CEMR LAB
HCT: 39.1 % (ref 36.0–46.0)
HDL goal, serum: 40 mg/dL
Hemoglobin: 13 g/dL (ref 12.0–15.0)
Iron: 103 ug/dL (ref 42–145)
Platelets: 368 10*3/uL (ref 150–400)
TIBC: 395 ug/dL (ref 250–470)
UIBC: 292 ug/dL
WBC: 8.6 10*3/uL (ref 4.0–10.5)

## 2010-01-21 ENCOUNTER — Encounter: Payer: Self-pay | Admitting: Internal Medicine

## 2010-02-15 ENCOUNTER — Emergency Department (HOSPITAL_COMMUNITY): Admission: EM | Admit: 2010-02-15 | Discharge: 2010-02-15 | Payer: Self-pay | Admitting: Emergency Medicine

## 2010-02-20 ENCOUNTER — Ambulatory Visit: Payer: Self-pay | Admitting: Internal Medicine

## 2010-03-26 ENCOUNTER — Ambulatory Visit: Payer: Self-pay | Admitting: Internal Medicine

## 2010-03-26 DIAGNOSIS — M719 Bursopathy, unspecified: Secondary | ICD-10-CM

## 2010-03-26 DIAGNOSIS — M67919 Unspecified disorder of synovium and tendon, unspecified shoulder: Secondary | ICD-10-CM | POA: Insufficient documentation

## 2010-03-26 LAB — CONVERTED CEMR LAB
Alkaline Phosphatase: 78 units/L (ref 39–117)
Creatinine, Ser: 0.7 mg/dL (ref 0.40–1.20)
Glucose, Bld: 105 mg/dL — ABNORMAL HIGH (ref 70–99)
Sodium: 137 meq/L (ref 135–145)
Total Bilirubin: 0.4 mg/dL (ref 0.3–1.2)
Total Protein: 6.7 g/dL (ref 6.0–8.3)

## 2010-04-09 ENCOUNTER — Ambulatory Visit: Payer: Self-pay | Admitting: Internal Medicine

## 2010-04-13 ENCOUNTER — Telehealth: Payer: Self-pay | Admitting: Internal Medicine

## 2010-04-14 ENCOUNTER — Ambulatory Visit: Payer: Self-pay | Admitting: Sports Medicine

## 2010-04-14 ENCOUNTER — Encounter: Payer: Self-pay | Admitting: Family Medicine

## 2010-04-15 ENCOUNTER — Encounter: Payer: Self-pay | Admitting: Family Medicine

## 2010-04-28 ENCOUNTER — Encounter
Admission: RE | Admit: 2010-04-28 | Discharge: 2010-06-10 | Payer: Self-pay | Source: Home / Self Care | Attending: Family Medicine | Admitting: Family Medicine

## 2010-05-05 ENCOUNTER — Ambulatory Visit: Payer: Self-pay | Admitting: Sports Medicine

## 2010-05-21 ENCOUNTER — Ambulatory Visit: Payer: Self-pay | Admitting: Internal Medicine

## 2010-06-10 ENCOUNTER — Encounter
Admission: RE | Admit: 2010-06-10 | Discharge: 2010-06-18 | Payer: Self-pay | Source: Home / Self Care | Attending: Family Medicine | Admitting: Family Medicine

## 2010-06-22 ENCOUNTER — Encounter
Admission: RE | Admit: 2010-06-22 | Discharge: 2010-07-21 | Payer: Self-pay | Source: Home / Self Care | Attending: Family Medicine | Admitting: Family Medicine

## 2010-07-05 ENCOUNTER — Emergency Department (HOSPITAL_COMMUNITY)
Admission: EM | Admit: 2010-07-05 | Discharge: 2010-07-05 | Payer: Self-pay | Source: Home / Self Care | Admitting: Emergency Medicine

## 2010-07-12 ENCOUNTER — Encounter: Payer: Self-pay | Admitting: Obstetrics and Gynecology

## 2010-07-14 ENCOUNTER — Encounter: Admit: 2010-07-14 | Payer: Self-pay | Admitting: Family Medicine
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
## 2010-07-21 NOTE — Assessment & Plan Note (Signed)
Summary: FU APPT/MJD   Vital Signs:  Patient profile:   45 year old female Pulse rate:   77 / minute BP sitting:   134 / 81  (right arm)  Vitals Entered By: Rochele Pages RN (May 05, 2010 1:35 PM) CC: f/u lt shoulder, neck pain   Primary Provider:  Hartley Barefoot MD  CC:  f/u lt shoulder and neck pain.  History of Present Illness: 45yo spanish-speaking female to office for f/u neck pain & shoulder pain. Interpretor used for entire encounter. Underwent subacromial steroid injection of L shoulder last visit, had good relief of symptoms x 1.5 weeks, but now starting to having increasing pain in the shoulder & upper back. Pain in upper back between shoulder blades & muscles feel tight. Still pain with overhead activities.  Occasional night time pain. Occasional numbness/tingling of whole left hand, but very intermittant. She has gone to 2-3 sessions of physical therapy & this seems to be helping. She is doing home exercises at least twice daily. Continues to take tramadol & diclofenac with minimal improvement. 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  She is on zoloft & amitriptyline prescribed by her psychiatrist whom she sees about q 71-months, next appt scheduled for 07/2010.  Has f/u with PCP in a few weeks.  Preventive Screening-Counseling & Management  Alcohol-Tobacco     Smoking Status: never  Allergies: 1)  ! Percocet 2)  ! * Dilaudid PMH-FH-SH reviewed for relevance  Review of Systems      See HPI  Physical Exam  General:  Well-developed,well-nourished,Spanish-speaking female in no acute distress; alert,appropriate and cooperative throughout examination Msk:  C-SPINE: no gross deformity.  Normal ROM with some pain with rotation today.  No midline tenderness, mild TTP in b/l paraspinal muscles with mild associated spasm.  Neg Spurling's b/l  T-SPINE: no gross deformity, no midline tenderness.  TTP over trapezius bilaterally, no spasm today.  Less tender  compared to previous evaluation.  SHOULDERS: - R shoulder:   Inspection reveals no abnormalities, atrophy or asymmetry. No TTP over [facility] joint or bicipital groove. ROM is full in all planes without pain. Rotator cuff strength normal throughout. No signs of impingement with negative Neer and Hawkin's tests, empty can. Speeds and Yergason's tests normal. No labral pathology noted with negative Obrien's. Normal scapular function observed. No painful arc and no drop arm sign. No apprehension sign  - L shoulder: Inspection reveals no abnormalities, atrophy or asymmetry. Mild TTP over bicipital groove, no TTP over AC-joint. Full ROM without pain today. Rotator cuff strength normal throughout. (-)Hawkins, (-)Neer, mildly (+)Empty can. Speeds test normal. No labral pathology noted with negative Obrien's. Normal scapular function observed. No drop arm sign. No apprehension sign  Normal grip strength bilateral hands.  Pulses:  +2/4 radial b/l Neurologic:  sensation intact to light touch.   DTR +2/4 bicep, tricep, brachioradialis b/l Neg Spurlings b/l   Impression & Recommendations:  Problem # 1:  SHOULDER PAIN (ICD-719.41) - Patient with persistent symptoms today, although exam greatly improved compared to visit.  Most of her pain at this time seems to be related to increased tightness/spasm of surrounding musculature such as trapezius & rhomboids - She is on a good medication regimen at this time - continue Diclofenac & tramadol as needed. - Amitriptyline should help with muscle tightness - this is mainly for depression & managed by her psychiatrist, could consider increasing the dose - Feel physical therapy is the best treatment at this time.  Emphasized need to  do home exercises on daily basis. - f/u 6-weeks  >30 mins spent face-to-face with patient with over half of visit dedicated to counseling & reviewing further plan of care.  Her updated medication list for this problem  includes:    Diclofenac Sodium 75 Mg Tbec (Diclofenac sodium) .Marland Kitchen... Take 1 tablet by mouth three times a day as needed for pain  Problem # 2:  MUSCLE SPASM (ICD-728.85) - Slightly improved, seems to be contributing to most of her symptoms at this time - Physical therapy is best option at this time.  HEP as stated above. - Pt already on amitriptyline as stated above - could consider dose increase if ok with psychiatrist who is prescribing medication for depression - Explored other treatment options to help with spasm & pain, but do not feel gabapentin is a good option given her other current medications. - Feel patient's underlying depression & life situations largely contributing to her symptoms at this time, especially since objective exam improved from last visit with injection, but symptoms still persisting.  Complete Medication List: 1)  Proventil Hfa 108 (90 Base) Mcg/act Aers (Albuterol sulfate) .... 2 puff four times a day as needed. 2)  Zoloft 100 Mg Tabs (Sertraline hcl) .... Take 1 tablet by mouth two times a day 3)  Amitriptyline Hcl 75 Mg Tabs (Amitriptyline hcl) .... Take 1 tablet by mouth once a day 4)  Klonopin 1 Mg Tabs (Clonazepam) .... Take 1 tablet by mouth once a day 5)  Ambien 10 Mg Tabs (Zolpidem tartrate) .... Take 1 tablet by mouth bedtime. 6)  Promethazine Hcl 12.5 Mg Tabs (Promethazine hcl) .... Take 1 pill by mouth three times a day as needed for nausea/vomiting. 7)  Ranitidine Hcl 150 Mg Caps (Ranitidine hcl) .... Take 1 tablet by mouth two times a day 8)  Bentyl 20 Mg Tabs (Dicyclomine hcl) .... Take 1 tablet by mouth four times a day 9)  Diclofenac Sodium 75 Mg Tbec (Diclofenac sodium) .... Take 1 tablet by mouth three times a day as needed for pain   Orders Added: 1)  Est. Patient Level IV [60454]
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
## 2010-07-21 NOTE — Letter (Signed)
Summary: Karen [name]   Karen [name]   Imported By: Margie Billet 10/23/2009 12:45:13  _____________________________________________________________________  External Attachment:    Type:   Image     Comment:   External Document  Appended Document: Karen DCaralee Ates  Patient will need to sign information release form first.
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
## 2010-07-21 NOTE — Assessment & Plan Note (Signed)
Summary: CHECKUP/SB.   Vital Signs:  Patient profile:   45 year old female Height:      62 inches (157.48 cm) Weight:      137.7 pounds (62.59 kg) BMI:     25.28 Temp:     98.8 degrees F (37.11 degrees C) oral Pulse rate:   83 / minute BP sitting:   132 / 81  (right arm)  Vitals Entered By: Blenda Mounts (October 14, 2009 1:54 PM)/ Angelina Ok, RN October 14, 2009 1:54 PM CC: rib pain, eye problem, chest pain x2weeks, numbness in right arm Is Patient Diabetic? No Pain Assessment Patient in pain? yes     Location: chest Intensity: 5 Type: pressure Nutritional Status BMI of 25 - 29 = overweight  Have you ever been in a relationship where you felt threatened, hurt or afraid?No   Does patient need assistance? Functional Status Self care Ambulation Normal   Primary Care Provider:  Hartley Barefoot MD  CC:  rib pain, eye problem, chest pain x2weeks, and numbness in right arm.  History of Present Illness: 45  year old with Past Medical History: Migraine headaches  She presents complaining of pain right side chest , where she had rib fracture.   She is also complaining of chest pain for the  last 2 weeks, occours twice a week, at rest, sharp in quality, 5/10, last for 1 hour. The pain is accompanied by dyspnea, and worry feeling.  She denies recreational drug, skoming , no family history of heart disease. She took tylenol, motrin, ibuprofen but is not helping with pain.     Depression History:      The patient is having a depressed mood most of the day but denies diminished interest in her usual daily activities.        Comments:  medicine is helping.   Preventive Screening-Counseling & Management  Alcohol-Tobacco     Alcohol drinks/day: <1     Smoking Status: never  Caffeine-Diet-Exercise     Does Patient Exercise: no  Current Medications (verified): 1)  Topamax 50 Mg Tabs (Topiramate) .... Once Daily 2)  Proventil Hfa 108 (90 Base) Mcg/act Aers (Albuterol  Sulfate) .... 2 Puff Four Times A Day As Needed. 3)  Zoloft 100 Mg Tabs (Sertraline Hcl) .... Take 1 Tablet By Mouth Two Times A Day 4)  Amitriptyline Hcl 75 Mg Tabs (Amitriptyline Hcl) .... Take 1 Tablet By Mouth Once A Day 5)  Klonopin 1 Mg Tabs (Clonazepam) .... Take 1 Tablet By Mouth Once A Day 6)  Ambien 10 Mg Tabs (Zolpidem Tartrate) .... Take 1 Tablet By Mouth Bedtime. 7)  Tramadol Hcl 50 Mg Tabs (Tramadol Hcl) .... Take 1 Tablet By Mouth Every 8 Hour For Pain As Needed.  Allergies: 1)  ! Percocet 2)  ! * Dilaudid  Review of Systems  The patient denies fever, dyspnea on exertion, peripheral edema, prolonged cough, hemoptysis, abdominal pain, melena, hematochezia, and severe indigestion/heartburn.    Physical Exam  General:  alert, well-developed, well-nourished, and well-hydrated.   Head:  normocephalic, atraumatic, and no abnormalities observed.   Lungs:  normal respiratory effort, no intercostal retractions, no accessory muscle use, and normal breath sounds.   Heart:  normal rate and regular rhythm.   Abdomen:  soft, non-tender, normal bowel sounds, and no distention.   Extremities:  No edema.    Impression & Recommendations:  Problem # 1:  CHEST PAIN, ATYPICAL (ICD-786.59) Patient present complaining of chest pain 2 weeks of duration,  EKG nl, no st changes no Q wave. She doesnt have risk factors, no family history heart diseases, no recreational drug no smoking history. This could be muscle skeletal, vs GI. i will prescribe tramadol, and restart her protonix. Her troponin was 0.01 above normal, I think this is not significan value but I will  refer to cardiology for stress test or further management. I will check CBC and fasting lipid panel.  Orders: T-CK MB Fract ([id number]) T-Troponin I [phone]) Cardiology Referral (Cardiology) T-CBC No Diff ([id number])Future Orders: T-Lipid Profile ([id number]) ... 10/17/2009  Problem # 2:  CLOSED FRACTURE OF ONE RIB  (ICD-807.01) She has pain also on prior Rib fracture area. i will prescribe tramadol. Her last Xray showed healing rib fracture.   Problem # 3:  MIGRAINES, HX OF (ICD-V13.8)  Problem # 4:  Preventive Health Care (ICD-V70.0)  Complete Medication List: 1)  Topamax 50 Mg Tabs (Topiramate) .... Once daily 2)  Proventil Hfa 108 (90 Base) Mcg/act Aers (Albuterol sulfate) .... 2 puff four times a day as needed. 3)  Zoloft 100 Mg Tabs (Sertraline hcl) .... Take 1 tablet by mouth two times a day 4)  Amitriptyline Hcl 75 Mg Tabs (Amitriptyline hcl) .... Take 1 tablet by mouth once a day 5)  Klonopin 1 Mg Tabs (Clonazepam) .... Take 1 tablet by mouth once a day 6)  Ambien 10 Mg Tabs (Zolpidem tartrate) .... Take 1 tablet by mouth bedtime. 7)  Tramadol Hcl 50 Mg Tabs (Tramadol hcl) .... Take 1 tablet by mouth every 8 hour for pain as needed. 8)  Omeprazole 20 Mg Cpdr (Omeprazole) .... Take 1 tablet by mouth   daily.  Other Orders: Ophthalmology Referral (Ophthalmology)  Patient Instructions: 1)  Please schedule a follow-up appointment in 2 weeks. Prescriptions: OMEPRAZOLE 20 MG CPDR (OMEPRAZOLE) Take 1 tablet by mouth   daily.  #30 x 2   Entered and Authorized by:   Hartley Barefoot MD   Signed by:   Hartley Barefoot MD on 10/14/2009   Method used:   Print then Give to Patient   RxID:   [id number] TRAMADOL HCL 50 MG TABS (TRAMADOL HCL) take 1 tablet by mouth every 8 hour for pain as needed.  #60 x 0   Entered and Authorized by:   Hartley Barefoot MD   Signed by:   Hartley Barefoot MD on 10/14/2009   Method used:   Print then Give to Patient   RxID:   [id number] TOPAMAX 50 MG TABS (TOPIRAMATE) once daily  #31 x 3   Entered and Authorized by:   Hartley Barefoot MD   Signed by:   Hartley Barefoot MD on 10/14/2009   Method used:   Print then Give to Patient   RxID:   [id number] TRAMADOL HCL 50 MG TABS (TRAMADOL HCL) take 1 tablet by mouth every 8 hour for pain as needed.  #60 x 0    Entered and Authorized by:   Hartley Barefoot MD   Signed by:   Hartley Barefoot MD on 10/14/2009   Method used:   Electronically to        [facility] [address] [phone]* (retail)       [address]       [place], [place]  [address]       Ph: [phone] or [phone]       Fax: [phone]   RxID:   [id number] TOPAMAX 50 MG TABS (TOPIRAMATE) once daily  #  31 x 3   Entered and Authorized by:   Hartley Barefoot MD   Signed by:   Hartley Barefoot MD on 10/14/2009   Method used:   Electronically to        [facility]. [address] [phone]* (retail)       [address]       [place], [place]  [address]       Ph: [phone] or [phone]       Fax: [phone]   RxID:   [id number]   Prevention & Chronic Care Immunizations   Influenza vaccine: Fluvax Non-MCR  (03/21/2009)   Influenza vaccine due: 02/19/2009    Tetanus booster: 12/09/2008: Td   Tetanus booster due: 11/19/2013    Pneumococcal vaccine: Not documented  Other Screening   Pap smear: Not documented   Pap smear action/deferral: Not indicated S/P hysterectomy  (12/09/2008)    Mammogram: BI-RADS CATEGORY 3: Probably benign finding(s) - short interval^MM DIGITAL DIAGNOSTIC BILAT  (12/12/2008)   Smoking status: never  (10/14/2009)  Lipids   Total Cholesterol: Not documented   Lipid panel action/deferral: Not indicated   LDL: Not documented   LDL Direct: Not documented   HDL: Not documented   Triglycerides: Not documented  Process Orders Check Orders Results:     [facility]: ABN not required for this insurance Tests Sent for requisitioning (October 15, 2009 9:38 AM):     10/14/2009: [facility] -- T-CK MB Fract [[id number]] (signed)     10/14/2009: [facility] -- T-Troponin I [phone] (signed)     10/17/2009: [facility] -- T-Lipid Profile [phone] (signed)     10/14/2009: [facility]  -- T-CBC No Diff [[id number]] (signed)

## 2010-07-21 NOTE — Assessment & Plan Note (Signed)
Summary: acute-1 month check up pe dr regalado/cfb   Vital Signs:  Patient profile:   45 year old female Height:      62 inches (157.48 cm) Weight:      139.3 pounds (63.32 kg) BMI:     25.57 Temp:     97.6 degrees F (36.44 degrees C) oral Pulse rate:   82 / minute BP sitting:   123 / 76  (right arm)  Vitals Entered By: Stanton Kidney Ditzler RN (December 01, 2009 3:14 PM) Is Patient Diabetic? No Pain Assessment Patient in pain? yes     Location: chest and upper abd Intensity: 5-7 Type: ? Onset of pain  past 2 months Nutritional Status BMI of 25 - 29 = overweight Nutritional Status Detail appetite good  Have you ever been in a relationship where you felt threatened, hurt or afraid?denies   Does patient need assistance? Functional Status Self care Ambulation Normal Comments FU - no change.   Primary Care Provider:  Hartley Barefoot MD   History of Present Illness: Karen [name] comes for a f/u visit. She has negative stress test recently. There is some language issue.   1. CP: She still has some pain in the chest, especially if someone pushes on the chest. No worsening of the pain with movement of the arms. She  has no acid reflux, but she has epigastric pain which is burning in sensation. She also has some nausea.     Depression History:      The patient denies a depressed mood most of the day and a diminished interest in her usual daily activities.         Preventive Screening-Counseling & Management  Alcohol-Tobacco     Alcohol drinks/day: <1     Smoking Status: never  Caffeine-Diet-Exercise     Does Patient Exercise: no  Current Medications (verified): 1)  Proventil Hfa 108 (90 Base) Mcg/act Aers (Albuterol Sulfate) .... 2 Puff Four Times A Day As Needed. 2)  Zoloft 100 Mg Tabs (Sertraline Hcl) .... Take 1 Tablet By Mouth Two Times A Day 3)  Amitriptyline Hcl 75 Mg Tabs (Amitriptyline Hcl) .... Take 1 Tablet By Mouth Once A Day 4)  Klonopin 1 Mg Tabs (Clonazepam) ....  Take 1 Tablet By Mouth Once A Day 5)  Ambien 10 Mg Tabs (Zolpidem Tartrate) .... Take 1 Tablet By Mouth Bedtime. 6)  Tramadol Hcl 50 Mg Tabs (Tramadol Hcl) .... Take 1 Tablet By Mouth Every 8 Hour For Pain As Needed. 7)  Omeprazole 20 Mg Cpdr (Omeprazole) .... Take 2 Tablest By Mouth  Two Times A Day 8)  Promethazine Hcl 12.5 Mg Tabs (Promethazine Hcl) .... Take 1 Pill By Mouth Three Times A Day As Needed For Nausea/vomiting.  Allergies: 1)  ! Percocet 2)  ! * Dilaudid  Review of Systems      See HPI  Physical Exam  Mouth:  pharynx pink and moist.   Chest Wall:  There was tenderness on the lower front chest wall with palpation.  Lungs:  normal respiratory effort, no crackles, and no wheezes.   Heart:  normal rate, regular rhythm, no murmur, and no gallop.   Abdomen:  soft.  THere is mild tenderness on the epigastrium, without rebound. No guarding/rigidity.  Extremities:  trace left pedal edema and trace right pedal edema.     Impression & Recommendations:  Problem # 1:  CHEST PAIN, ATYPICAL (ICD-786.59) Stress echo negative. WIll d/c her statin and asa. Encouraged to eat less  fatty and fried food and to exercise. Will f/u in a month and check FLP then. I think her CP is a combination of both MSK and more GI. SHe also has epigastric pain and nausea. She was on low dose PPI. I will increase it to 40 mg two times a day and f/u in a month. If there is no significant response then, she needs GI referral for EGD.   Complete Medication List: 1)  Proventil Hfa 108 (90 Base) Mcg/act Aers (Albuterol sulfate) .... 2 puff four times a day as needed. 2)  Zoloft 100 Mg Tabs (Sertraline hcl) .... Take 1 tablet by mouth two times a day 3)  Amitriptyline Hcl 75 Mg Tabs (Amitriptyline hcl) .... Take 1 tablet by mouth once a day 4)  Klonopin 1 Mg Tabs (Clonazepam) .... Take 1 tablet by mouth once a day 5)  Ambien 10 Mg Tabs (Zolpidem tartrate) .... Take 1 tablet by mouth bedtime. 6)  Tramadol Hcl 50  Mg Tabs (Tramadol hcl) .... Take 1 tablet by mouth every 8 hour for pain as needed. 7)  Omeprazole 20 Mg Cpdr (Omeprazole) .... Take 2 tablest by mouth  two times a day 8)  Promethazine Hcl 12.5 Mg Tabs (Promethazine hcl) .... Take 1 pill by mouth three times a day as needed for nausea/vomiting.  Patient Instructions: 1)  Please schedule a follow-up appointment in 1 month. 2)  Limit your Sodium (Salt) to less than 2 grams a day(slightly less than 1/2 a teaspoon) to prevent fluid retention, swelling, or worsening of symptoms. 3)  It is important that you exercise regularly at least 20 minutes 5 times a week. If you develop chest pain, have severe difficulty breathing, or feel very tired , stop exercising immediately and seek medical attention. 4)  You need to lose weight. Consider a lower calorie diet and regular exercise.  5)  Check your Blood Pressure regularly. If it is above: you should make an appointment. Prescriptions: OMEPRAZOLE 20 MG CPDR (OMEPRAZOLE) Take 2 tablest by mouth  two times a day  #90 x 1   Entered and Authorized by:   Jason Coop MD   Signed by:   Jason Coop MD on 12/01/2009   Method used:   Print then Give to Patient   RxID:   [id number] PROMETHAZINE HCL 12.5 MG TABS (PROMETHAZINE HCL) take 1 pill by mouth three times a day as needed for nausea/vomiting.  #60 x 0   Entered and Authorized by:   Jason Coop MD   Signed by:   Jason Coop MD on 12/01/2009   Method used:   Print then Give to Patient   RxID:   [id number]    Prevention & Chronic Care Immunizations   Influenza vaccine: Fluvax Non-MCR  (03/21/2009)   Influenza vaccine due: 02/19/2009    Tetanus booster: 12/09/2008: Td   Tetanus booster due: 11/19/2013    Pneumococcal vaccine: Not documented  Other Screening   Pap smear: Not documented   Pap smear action/deferral: Not indicated S/P hysterectomy  (12/09/2008)    Mammogram: BI-RADS CATEGORY 3: Probably benign  finding(s) - short interval^MM DIGITAL DIAGNOSTIC BILAT  (12/12/2008)   Smoking status: never  (12/01/2009)  Lipids   Total Cholesterol: 250  (10/18/2009)   Lipid panel action/deferral: Not indicated   LDL: 152  (10/18/2009)   LDL Direct: Not documented   HDL: 44  (10/18/2009)   Triglycerides: 268  (10/18/2009)  Hypertension   Last Blood Pressure: 123 / 76  (12/01/2009)  Serum creatinine: 0.73  (03/07/2009)   Serum potassium 4.3  (03/07/2009)  Self-Management Support :   Personal Goals (by the next clinic visit) :      Personal blood pressure goal: 140/90  (10/31/2009)   Patient will work on the following items until the next clinic visit to reach self-care goals:     Medications and monitoring: take my medicines every day, bring all of my medications to every visit  (12/01/2009)     Eating: eat more vegetables, use fresh or frozen vegetables, eat fruit for snacks and desserts  (12/01/2009)     Activity: take a 30 minute walk every day, park at the far end of the parking lot  (12/01/2009)    Hypertension self-management support: Written self-care plan, Education handout, Resources for patients handout  (12/01/2009)   Hypertension self-care plan printed.   Hypertension education handout printed      Resource handout printed.

## 2010-07-21 NOTE — Letter (Signed)
Summary: [facility] PT referral form  [facility] PT referral form   Imported By: Marily Memos 04/15/2010 10:12:43  _____________________________________________________________________  External Attachment:    Type:   Image     Comment:   External Document

## 2010-07-21 NOTE — Miscellaneous (Signed)
  Trygliceride high I will treat with statin for now because patient was complaining of chest pain. If work up is negative will consider change to tricor or try diet.   Medications: Added new medication of SIMVASTATIN 20 MG TABS (SIMVASTATIN) Take 1 tablet by mouth daily.

## 2010-07-21 NOTE — Progress Notes (Signed)
Summary: Medication  Phone Note Outgoing Call   Call placed by: Angelina Ok RN,  Oct 27, 2009 9:41 AM Call placed to: Patient Summary of Call: Call to pt.  Message left on pt's answering machine to call the Clinics. Needs new medication. Angelina Ok RN  Oct 27, 2009 9:42 AM Spoke to Dr. Sunnie Nielsen who said that she has spoken with the patient. Angelina Ok RN  Oct 30, 2009 3:50 PM  Initial call taken by: Angelina Ok RN,  Oct 27, 2009 9:42 AM

## 2010-07-21 NOTE — Letter (Signed)
Summary: *Consult Note  [facility]  [address]   [place], [place] [address]   Phone: [phone]  Fax: [phone]    Re:    Karen [name] DOB:    06/22/1965   Dear Dr. Laren Everts:    Thank you for requesting that we see the above patient for consultation.  A copy of the detailed office note will be sent under separate cover, for your review.  Evaluation today is consistent with:  1)  SHOULDER PAIN (ICD-719.41) 2)  MUSCLE SPASM (ICD-728.85) 3)  CERVICALGIA (ICD-723.1)   Our recommendation is for:  1) Patient underwent subacromial steroid injection of left shoulder in office today. 2) Referred for physical therapy to help with shoulder pain and neck pain.  She will be instructed on home exercise program as part of the therapy. 3) Continue diclofenac and tramadol as prescribed. 4) Follow-up with [place] in 3-4 weeks for re-evaluation.   New Orders include:  1)  Consultation Level II [99242] 2)  Kenalog 10 mg inj [J3301] 3)  Joint Aspirate / Injection, Large [20610]   New Medications started today include: None   After today's visit, the patients current medications include: 1)  PROVENTIL HFA 108 (90 BASE) MCG/ACT AERS (ALBUTEROL SULFATE) 2 puff four times a day as needed. 2)  ZOLOFT 100 MG TABS (SERTRALINE HCL) Take 1 tablet by mouth two times a day 3)  AMITRIPTYLINE HCL 75 MG TABS (AMITRIPTYLINE HCL) Take 1 tablet by mouth once a day 4)  KLONOPIN 1 MG TABS (CLONAZEPAM) Take 1 tablet by mouth once a day 5)  AMBIEN 10 MG TABS (ZOLPIDEM TARTRATE) Take 1 tablet by mouth bedtime. 6)  PROMETHAZINE HCL 12.5 MG TABS (PROMETHAZINE HCL) take 1 pill by mouth three times a day as needed for nausea/vomiting. 7)  RANITIDINE HCL 150 MG CAPS (RANITIDINE HCL) Take 1 tablet by mouth two times a day 8)  BENTYL 20 MG TABS (DICYCLOMINE HCL) Take 1 tablet by mouth four times a day 9)  DICLOFENAC SODIUM 75 MG TBEC (DICLOFENAC SODIUM) Take 1 tablet by mouth  three times a day as needed for pain   Thank you for this consultation.  If you have any further questions regarding the care of this patient, please do not hesitate to contact me @ [phone].  Thank you for this opportunity to look after your patient.  Sincerely,   Darene Lamer DO [facility]

## 2010-07-21 NOTE — Progress Notes (Signed)
Summary: med clarification/gp  Phone Note Refill Request Message from:  Fax from Pharmacy on April 13, 2010 2:32 PM  Refills Requested: Medication #1:  DICLOFENAC POTASSIUM 50 MG TABS Take 1 tablet by mouth three times a day. [facility] pharmacy only has Dicofenac 75mg .   Method Requested: Telephone to Pharmacy Initial call taken by: Chinita Pester RN,  April 13, 2010 2:33 PM    New/Updated Medications: DICLOFENAC SODIUM 75 MG TBEC (DICLOFENAC SODIUM) Take 1 tablet by mouth three times a day as needed for pain Prescriptions: DICLOFENAC SODIUM 75 MG TBEC (DICLOFENAC SODIUM) Take 1 tablet by mouth three times a day as needed for pain  #45 x 1   Entered and Authorized by:   Laren Everts MD   Signed by:   Laren Everts MD on 04/13/2010   Method used:   Faxed to ...       [facility] (retail)       [address] [place], [place]  [address]       Ph: [phone]       Fax: [phone]   RxID:   [id number]
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
## 2010-07-21 NOTE — Miscellaneous (Signed)
Summary: (Walk-in)  lipids//kg  Clinical Lists Changes   Pt here for lab only appt.  No orders in EMR, but per pt's last visit with Dr Carollee Massed, she was to return in for fasting lipids.  Pt has now been reassigned to Dr Narda Bonds.  Will obtain labs today and have results forwarded to Dr Narda Bonds.  Pt also has an appt on August 2nd with Dr Narda Bonds.Ezra Sites Duncan Dull)  December 29, 2009 10:36 AM  Orders: Added new Test order of T-Lipid Profile [id number]) - Signed     Process Orders Check Orders Results:     [facility]: ABN not required for this insurance Tests Sent for requisitioning (December 30, 2009 10:09 AM):     12/29/2009: [facility] -- T-Lipid Profile [phone] (signed)
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
## 2010-07-30 ENCOUNTER — Encounter: Payer: Self-pay | Admitting: Internal Medicine

## 2010-07-30 ENCOUNTER — Ambulatory Visit (INDEPENDENT_AMBULATORY_CARE_PROVIDER_SITE_OTHER): Payer: Self-pay | Admitting: Internal Medicine

## 2010-07-30 VITALS — BP 122/75 | HR 74 | Temp 97.2°F | Ht 63.4 in | Wt 135.1 lb

## 2010-07-30 DIAGNOSIS — F329 Major depressive disorder, single episode, unspecified: Secondary | ICD-10-CM

## 2010-07-30 DIAGNOSIS — K279 Peptic ulcer, site unspecified, unspecified as acute or chronic, without hemorrhage or perforation: Secondary | ICD-10-CM

## 2010-07-30 DIAGNOSIS — I10 Essential (primary) hypertension: Secondary | ICD-10-CM

## 2010-07-30 DIAGNOSIS — F3289 Other specified depressive episodes: Secondary | ICD-10-CM

## 2010-07-30 DIAGNOSIS — M549 Dorsalgia, unspecified: Secondary | ICD-10-CM

## 2010-07-30 MED ORDER — HYDROCODONE-ACETAMINOPHEN 5-500 MG PO TABS: 1.0000 | ORAL_TABLET | Freq: Every day | ORAL | Status: DC

## 2010-07-30 MED ORDER — CYCLOBENZAPRINE HCL 10 MG PO TABS: 10.0000 mg | ORAL_TABLET | Freq: Three times a day (TID) | ORAL | Status: DC | PRN

## 2010-07-30 MED ORDER — DICYCLOMINE HCL 20 MG PO TABS: 20.0000 mg | ORAL_TABLET | Freq: Four times a day (QID) | ORAL | Status: DC

## 2010-07-30 MED ORDER — RANITIDINE HCL 150 MG PO TABS: 150.0000 mg | ORAL_TABLET | Freq: Two times a day (BID) | ORAL | Status: DC

## 2010-07-30 NOTE — Assessment & Plan Note (Signed)
Stable. Denies suicidal/homicidal ideation. Continue current regimen.

## 2010-07-30 NOTE — Assessment & Plan Note (Signed)
At goal.  

## 2010-07-30 NOTE — Assessment & Plan Note (Addendum)
Acute on chronic flare of back pain. No concerns for cauda equina syndrome. Discontinue tramadol as not effectively controlling pain. Will start vicodin 1 tablet po daily prn pain. Patient signed pain contract. Muscle spasms may also be contributing so also started on flexeril. Will refer to PM&R for further evaluation and management. Patient has been to physical therapy for back pain within the last couple of months so recommended patient to continue doing exercises that was taught by physical therapist. Recommended against prolonged bed rest due to pain.

## 2010-07-30 NOTE — Assessment & Plan Note (Signed)
Patient not taking PPI or H2 blockers. Due to financial limitation. Will start patient on Ranitidine 150mg  po bid and follow up.

## 2010-07-30 NOTE — Patient Instructions (Signed)
Please return to clinic in 1 month for follow up. Take all medication as directed.

## 2010-07-30 NOTE — Progress Notes (Signed)
  Subjective:    Patient ID: Karen [name], female    DOB: 12/22/1965, 45 y.o.   MRN: [id number]  HPI 45  Yr old woman with pmhx as described below comes to the clinic for follow up of ED visit secondary to back pain.  Patient has history of back pain for a couple of years now. Reports that acute flare was in brought on by some physical activity that she had done on the day prior. Patient had been cleaning her house the day prior to acute flare of back pain. Pain was localized to the lower aspect of the back. Denies saddle anesthesia , bowel or bladder incontinence,  new weakness or in tingling of her legs.  Patient denies any fevers or chills. Denies any new trauma. Patient reports that in ED  no imaging was required.  Patient was sent home with pain medication and told to followup with primary care physician. Patient had been taking tramadol which was not alleviating pain. She will like to switch medications to Vicodin as it helped manage pain.    Patient reports some abdominal cramping  and some epigastric tenderness.  These problems have been chronic. She has been noncompliant with medication specifically bentyl.  Patient does report to take diclofenac daily for back pain.  Denies suicidal/homicidal ideation.  Review of Systems  All other systems reviewed and are negative.     Objective:   Physical Exam  Constitutional: She is oriented to person, place, and time. She appears well-developed.  Eyes: EOM are normal.  Neck: Normal range of motion.  Cardiovascular: Normal rate and regular rhythm.   Pulmonary/Chest: Effort normal and breath sounds normal.  Abdominal: Soft. There is tenderness.  Musculoskeletal: Normal range of motion. She exhibits no edema.       Lumbar paraspinal tenderness, no erythema or instability, muscle spasms noted  Neurological: She is alert and oriented to person, place, and time. No cranial nerve deficit. Coordination normal.  Psychiatric: She has a normal  mood and affect.        Assessment & Plan:

## 2010-08-02 ENCOUNTER — Emergency Department (HOSPITAL_COMMUNITY)
Admission: EM | Admit: 2010-08-02 | Discharge: 2010-08-02 | Disposition: A | Discharge status: Home / Self Care | Payer: Self-pay | Attending: Emergency Medicine | Admitting: Emergency Medicine

## 2010-08-02 ENCOUNTER — Emergency Department (HOSPITAL_COMMUNITY): Payer: Self-pay

## 2010-08-02 DIAGNOSIS — J45909 Unspecified asthma, uncomplicated: Secondary | ICD-10-CM | POA: Insufficient documentation

## 2010-08-02 DIAGNOSIS — Y929 Unspecified place or not applicable: Secondary | ICD-10-CM | POA: Insufficient documentation

## 2010-08-02 DIAGNOSIS — S8000XA Contusion of unspecified knee, initial encounter: Secondary | ICD-10-CM | POA: Insufficient documentation

## 2010-08-02 DIAGNOSIS — M25569 Pain in unspecified knee: Secondary | ICD-10-CM | POA: Insufficient documentation

## 2010-08-02 DIAGNOSIS — F3289 Other specified depressive episodes: Secondary | ICD-10-CM | POA: Insufficient documentation

## 2010-08-02 DIAGNOSIS — F329 Major depressive disorder, single episode, unspecified: Secondary | ICD-10-CM | POA: Insufficient documentation

## 2010-08-02 DIAGNOSIS — Z79899 Other long term (current) drug therapy: Secondary | ICD-10-CM | POA: Insufficient documentation

## 2010-08-02 DIAGNOSIS — S81009A Unspecified open wound, unspecified knee, initial encounter: Secondary | ICD-10-CM | POA: Insufficient documentation

## 2010-08-02 DIAGNOSIS — W010XXA Fall on same level from slipping, tripping and stumbling without subsequent striking against object, initial encounter: Secondary | ICD-10-CM | POA: Insufficient documentation

## 2010-08-19 ENCOUNTER — Ambulatory Visit: Payer: Self-pay | Admitting: Rehabilitation

## 2010-09-04 LAB — URINALYSIS, ROUTINE W REFLEX MICROSCOPIC
Glucose, UA: NEGATIVE mg/dL
Ketones, ur: NEGATIVE mg/dL
Protein, ur: NEGATIVE mg/dL
pH: 6 (ref 5.0–8.0)

## 2010-09-04 LAB — COMPREHENSIVE METABOLIC PANEL
ALT: 22 U/L (ref 0–35)
AST: 27 U/L (ref 0–37)
Albumin: 4.1 g/dL (ref 3.5–5.2)
Alkaline Phosphatase: 90 U/L (ref 39–117)
CO2: 26 mEq/L (ref 19–32)
Chloride: 105 mEq/L (ref 96–112)
Creatinine, Ser: 0.78 mg/dL (ref 0.4–1.2)
GFR calc Af Amer: >60 mL/min (ref >60)
GFR calc non Af Amer: >60 mL/min (ref >60)
Potassium: 3.7 mEq/L (ref 3.5–5.1)
Total Bilirubin: 0.1 mg/dL — ABNORMAL LOW (ref 0.3–1.2)

## 2010-09-04 LAB — DIFFERENTIAL
Basophils Absolute: 0 10*3/uL (ref 0.0–0.1)
Basophils Relative: 0 % (ref 0–1)
Eosinophils Absolute: 0.2 10*3/uL (ref 0.0–0.7)
Eosinophils Relative: 2 % (ref 0–5)
Lymphocytes Relative: 47 % — ABNORMAL HIGH (ref 12–46)
Monocytes Absolute: 0.5 10*3/uL (ref 0.1–1.0)

## 2010-09-04 LAB — LIPASE, BLOOD: Lipase: 41 U/L (ref 11–59)

## 2010-09-04 LAB — CBC
Hemoglobin: 12.9 g/dL (ref 12.0–15.0)
MCH: 31.7 pg (ref 26.0–34.0)
RBC: 4.07 MIL/uL (ref 3.87–5.11)
WBC: 9.5 10*3/uL (ref 4.0–10.5)

## 2010-09-04 LAB — URINE MICROSCOPIC-ADD ON

## 2010-09-11 ENCOUNTER — Ambulatory Visit (INDEPENDENT_AMBULATORY_CARE_PROVIDER_SITE_OTHER): Payer: Self-pay | Admitting: Internal Medicine

## 2010-09-11 ENCOUNTER — Encounter: Payer: Self-pay | Admitting: Internal Medicine

## 2010-09-11 VITALS — BP 126/77 | HR 81 | Temp 97.7°F | Wt 138.9 lb

## 2010-09-11 DIAGNOSIS — F329 Major depressive disorder, single episode, unspecified: Secondary | ICD-10-CM

## 2010-09-11 DIAGNOSIS — M549 Dorsalgia, unspecified: Secondary | ICD-10-CM

## 2010-09-11 DIAGNOSIS — K589 Irritable bowel syndrome without diarrhea: Secondary | ICD-10-CM

## 2010-09-11 DIAGNOSIS — E785 Hyperlipidemia, unspecified: Secondary | ICD-10-CM

## 2010-09-11 LAB — URINALYSIS, ROUTINE W REFLEX MICROSCOPIC
Bilirubin Urine: NEGATIVE
Glucose, UA: NEGATIVE mg/dL
Hgb urine dipstick: NEGATIVE
Ketones, ur: NEGATIVE mg/dL
Protein, ur: NEGATIVE mg/dL
Urobilinogen, UA: 1 mg/dL (ref 0.0–1.0)

## 2010-09-11 LAB — DIFFERENTIAL
Eosinophils Absolute: 0.2 10*3/uL (ref 0.0–0.7)
Eosinophils Relative: 2 % (ref 0–5)
Lymphocytes Relative: 33 % (ref 12–46)
Lymphs Abs: 3.5 10*3/uL (ref 0.7–4.0)
Monocytes Absolute: 0.6 10*3/uL (ref 0.1–1.0)

## 2010-09-11 LAB — CBC
HCT: 38.1 % (ref 36.0–46.0)
Hemoglobin: 12.6 g/dL (ref 12.0–15.0)
MCH: 31.4 pg (ref 26.0–34.0)
MCHC: 33.1 g/dL (ref 30.0–36.0)
MCHC: 34.6 g/dL (ref 30.0–36.0)
MCV: 94.3 fL (ref 78.0–100.0)
MCV: 95 fL (ref 78.0–100.0)
Platelets: 288 10*3/uL (ref 150–400)
RBC: 3.92 MIL/uL (ref 3.87–5.11)
RDW: 13.4 % (ref 11.5–15.5)
WBC: 10.8 10*3/uL — ABNORMAL HIGH (ref 4.0–10.5)

## 2010-09-11 LAB — COMPREHENSIVE METABOLIC PANEL
ALT: 14 U/L (ref 0–35)
AST: 17 U/L (ref 0–37)
Albumin: 4.2 g/dL (ref 3.5–5.2)
CO2: 26 mEq/L (ref 19–32)
Calcium: 9.8 mg/dL (ref 8.4–10.5)
Chloride: 101 mEq/L (ref 96–112)
Creatinine, Ser: 0.64 mg/dL (ref 0.4–1.2)
GFR calc Af Amer: >60 mL/min (ref >60)
Sodium: 145 mEq/L (ref 135–145)

## 2010-09-11 LAB — WET PREP, GENITAL
Trich, Wet Prep: NONE SEEN
Yeast Wet Prep HPF POC: NONE SEEN

## 2010-09-11 LAB — LIPID PANEL

## 2010-09-11 LAB — POCT PREGNANCY, URINE: Preg Test, Ur: NEGATIVE

## 2010-09-11 MED ORDER — ONDANSETRON HCL 4 MG PO TABS: 4.0000 mg | ORAL_TABLET | Freq: Every day | ORAL | Status: DC | PRN

## 2010-09-11 NOTE — Assessment & Plan Note (Signed)
Stable  Continue current regimen  

## 2010-09-11 NOTE — Assessment & Plan Note (Signed)
Will refer to Pain clinic for further management.

## 2010-09-11 NOTE — Patient Instructions (Signed)
Make a follow up appointment in 3 months. Take all medication as directed. You will be called with any abnormalities in the tests scheduled or performed today.  If you don't hear from [place] within a week from when the test was performed, you can assume that your test was normal.

## 2010-09-11 NOTE — Assessment & Plan Note (Signed)
Continue symptomatic treatment. Instructed to take zofran as directed for nausea.

## 2010-09-11 NOTE — Assessment & Plan Note (Signed)
Recheck FLP today. 
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
## 2010-09-23 ENCOUNTER — Emergency Department (HOSPITAL_COMMUNITY): Payer: Self-pay

## 2010-09-23 ENCOUNTER — Emergency Department (HOSPITAL_COMMUNITY)
Admission: EM | Admit: 2010-09-23 | Discharge: 2010-09-23 | Disposition: A | Discharge status: Home / Self Care | Payer: Self-pay | Attending: Emergency Medicine | Admitting: Emergency Medicine

## 2010-09-23 DIAGNOSIS — K589 Irritable bowel syndrome without diarrhea: Secondary | ICD-10-CM | POA: Insufficient documentation

## 2010-09-23 DIAGNOSIS — R1084 Generalized abdominal pain: Secondary | ICD-10-CM | POA: Insufficient documentation

## 2010-09-23 DIAGNOSIS — K219 Gastro-esophageal reflux disease without esophagitis: Secondary | ICD-10-CM | POA: Insufficient documentation

## 2010-09-23 DIAGNOSIS — R112 Nausea with vomiting, unspecified: Secondary | ICD-10-CM | POA: Insufficient documentation

## 2010-09-23 DIAGNOSIS — R197 Diarrhea, unspecified: Secondary | ICD-10-CM | POA: Insufficient documentation

## 2010-09-23 DIAGNOSIS — R1013 Epigastric pain: Secondary | ICD-10-CM | POA: Insufficient documentation

## 2010-09-23 DIAGNOSIS — J45909 Unspecified asthma, uncomplicated: Secondary | ICD-10-CM | POA: Insufficient documentation

## 2010-09-23 LAB — BASIC METABOLIC PANEL
Calcium: 9.3 mg/dL (ref 8.4–10.5)
Chloride: 103 mEq/L (ref 96–112)
Creatinine, Ser: 0.73 mg/dL (ref 0.4–1.2)
GFR calc Af Amer: >60 mL/min (ref >60)
Sodium: 136 mEq/L (ref 135–145)

## 2010-09-23 LAB — DIFFERENTIAL
Basophils Absolute: 0 10*3/uL (ref 0.0–0.1)
Eosinophils Relative: 1 % (ref 0–5)
Lymphocytes Relative: 32 % (ref 12–46)
Neutro Abs: 7.6 10*3/uL (ref 1.7–7.7)
Neutrophils Relative %: 61 % (ref 43–77)

## 2010-09-23 LAB — URINALYSIS, ROUTINE W REFLEX MICROSCOPIC
Glucose, UA: NEGATIVE mg/dL
Protein, ur: NEGATIVE mg/dL
Specific Gravity, Urine: 1.014 (ref 1.005–1.030)

## 2010-09-23 LAB — CBC
HCT: 34.8 % — ABNORMAL LOW (ref 36.0–46.0)
Hemoglobin: 11.9 g/dL — ABNORMAL LOW (ref 12.0–15.0)
RBC: 3.73 MIL/uL — ABNORMAL LOW (ref 3.87–5.11)
RDW: 13 % (ref 11.5–15.5)
WBC: 12.5 10*3/uL — ABNORMAL HIGH (ref 4.0–10.5)

## 2010-09-23 LAB — LIPASE, BLOOD: Lipase: 41 U/L (ref 11–59)

## 2010-09-23 LAB — HEPATIC FUNCTION PANEL
Albumin: 3.9 g/dL (ref 3.5–5.2)
Total Protein: 6.9 g/dL (ref 6.0–8.3)

## 2010-09-28 LAB — URINALYSIS, ROUTINE W REFLEX MICROSCOPIC
Glucose, UA: NEGATIVE mg/dL
Hgb urine dipstick: NEGATIVE
Ketones, ur: NEGATIVE mg/dL
Nitrite: NEGATIVE
pH: 6 (ref 5.0–8.0)

## 2010-09-28 LAB — COMPREHENSIVE METABOLIC PANEL
ALT: 16 U/L (ref 0–35)
Alkaline Phosphatase: 81 U/L (ref 39–117)
CO2: 21 mEq/L (ref 19–32)
Glucose, Bld: 97 mg/dL (ref 70–99)
Potassium: 3.5 mEq/L (ref 3.5–5.1)
Sodium: 134 mEq/L — ABNORMAL LOW (ref 135–145)
Total Protein: 6.8 g/dL (ref 6.0–8.3)

## 2010-09-28 LAB — DIFFERENTIAL
Basophils Relative: 0 % (ref 0–1)
Eosinophils Absolute: 0.2 10*3/uL (ref 0.0–0.7)
Eosinophils Relative: 2 % (ref 0–5)
Monocytes Relative: 6 % (ref 3–12)
Neutrophils Relative %: 60 % (ref 43–77)

## 2010-09-28 LAB — CBC
Hemoglobin: 12.6 g/dL (ref 12.0–15.0)
RBC: 3.82 MIL/uL — ABNORMAL LOW (ref 3.87–5.11)

## 2010-09-28 LAB — URINE CULTURE
Colony Count: NO GROWTH
Culture: NO GROWTH

## 2010-09-28 LAB — POCT PREGNANCY, URINE: Preg Test, Ur: NEGATIVE

## 2010-09-29 LAB — POCT URINALYSIS DIP (DEVICE)
Glucose, UA: NEGATIVE mg/dL
Nitrite: NEGATIVE
Protein, ur: NEGATIVE mg/dL
Urobilinogen, UA: 1 mg/dL (ref 0.0–1.0)

## 2010-09-29 LAB — POCT RAPID STREP A (OFFICE): Streptococcus, Group A Screen (Direct): NEGATIVE

## 2010-11-06 NOTE — Discharge Summary (Signed)
Karen [name], UPLINGER NO.:  [id number]   MEDICAL RECORD NO.:  [id number]                   PATIENT TYPE:  IPS   LOCATION:  0503                                 [facility]ILITY:  [facility]   PHYSICIAN:  Geoffery Lyons, M.D.                   DATE OF BIRTH:  11/22/1969   DATE OF ADMISSION:  10/24/2003  DATE OF DISCHARGE:  11/04/2003                                 DISCHARGE SUMMARY   CHIEF COMPLAINT AND PRESENT ILLNESS:  This was the first admission to [facility] for this 45 year old [facility] for this 45 year old Hispanic female  voluntarily admitted.  She had a history of intentional overdose on  Klonopin, taking 15 tablets.  Her stressors were with her son having  conflict.  She overdosed because she wanted to die.  She had been arguing  with her son.  Apparently the son wanted more freedom.  He had been dating a  45 year old girl and they were very concerned about it.  She endorsed that  her son was sexually active and she was concerned about this.  The husband  apparently was not supportive of setting the limits on her son.  Sleep had  been satisfactory.  Appetite had been decreased.  She lost about 15 pounds.   PAST PSYCHIATRIC HISTORY:  This was the first time at [facility].  She had a history of depression and anxiety, multiple depressions  in the past.   SUBSTANCE ABUSE HISTORY:  She denied the use or abuse of any substances.   PAST MEDICAL HISTORY:  1. Migraine.  2. Past history of high blood pressure.   MEDICATIONS:  1. Klonopin.  2. Amitriptyline.  3. Propranolol.  4. Ambien.  5. Imitrex for migraine.   PHYSICAL EXAMINATION:  Physical examination was performed, failed to show  any acute findings.   LABORATORY DATA:  Hemoglobin 11.4, hematocrit 33.5.  TSH 1.245.   MENTAL STATUS EXAM:  Mental status exam upon admission revealed an alert,  cooperative female, fair eye contact, casually dressed, cooperative with the  interview.  Speech  was clear, goal oriented.  The patient was basically  speaking Spanish, felt anxious.  Thought processes were coherent and  relevant, dealt with the events, the conflict with the son, __________ to  cope.  There was no evidence of delusional ideas.  There was no evidence of  hallucinations.  Cognitive: Cognition was well preserved.   ADMISSION DIAGNOSES:   AXIS I:  Major depression, single episode.   AXIS II:  No diagnosis.   AXIS III:  Migraines.   AXIS IV:  Moderate.   AXIS V:  Global assessment of functioning upon admission 35, highest global  assessment of functioning in the last year 65.   HOSPITAL COURSE:  She was admitted and started in intensive individual and  group psychotherapy.  She was maintained on Klonopin 0.5 mg every six hours  as needed for anxiety.  She was given Ambien 10 mg for sleep, amitriptyline  50 mg at night, and propranolol 40 mg at night.  She endorsed increased  signs and symptoms of depression.  She had been on multiple medications.  She felt that amitriptyline had been the most effective, 25 mg at night,  could not tolerate more than that.  She stated that her main concern was  conflict with her 45 year old son son.  He had been with her for the last two  years.  He was in [place] before.  She felt that he was oppositional, wanted  to keep the relationship with the 45 year old girl.  She stated that the  overdose was impulsive and that she would not hurt herself.  She was  concerned because the husband was not supportive of her approach to setting  limits on the son.  She continued to endorse feeling depress, the multiple  stressors, worrying, ruminating, tearful.  We went ahead and started Zoloft  25 mg per day.  She continued to evidence the depression, very upset with  her situation, a sense of hopelessness and helplessness.  She actually told  her family that she would leave to go to [place] and let them by  themselves.  She was upset and hurt  because they did not even say much in  terms of asking her to stay.  She was not able to sleep, ruminating,  worrying, so we went ahead and added some Seroquel 25 mg.  Seroquel was  increased to 50 mg at night and Zoloft was increased to 25 mg and then 100  mg.  She continued to endorse a severe depression, very remorseful, guilty  about the fact that she had to leave the son behind.  She had increased  insight in terms of her need to make up for the lost time now when her son  was older and was not effectively involved.  There was a family session on  May 14 with her husband.  It went well.  She felt pretty secure in the  hospital.  She had a hard time thinking that she had to leave and face the  outside.  She expressed a lot of ambivalence about leaving her husband or  not.  She was also concerned about the fact that she was having more  migraines than before.  On May 15, she was not endorsing any suicidal  ideation or any homicidal ideation.  There was, if anything, some wanting to  get better.  On May 16, she was in full contact with reality.  Her mood had  improved, affect was brighter.  She endorsed no suicidal ideas or homicidal  ideas, was willing and motivated to pursue further outpatient treatment.  She felt that she could manage herself at home.   DISCHARGE DIAGNOSES:   AXIS I:  1. Major depression.  2. Anxiety disorder, not otherwise specified.   AXIS II:  No diagnosis.   AXIS III:  Migraines.   AXIS IV:  Moderate.   AXIS V:  Global assessment of functioning upon discharge 55-60.   DISCHARGE MEDICATIONS:  1. Zoloft 100 mg per day.  2. Ambien 10 mg at bedtime for sleep.  3. Inderal 40 mg at night.  4. Elavil 25 mg by mouth at night.  5. Klonopin 0.5 mg two in the morning, one at 4 p.m., and one at 10 p.m.  6. Seroquel 25 mg one twice a day.  7. Hydrocodone 5/500  mg one every six hours as needed for pain.  FOLLOW UP:  She was to follow up at [facility].                                               Geoffery Lyons, M.D.    IL/MEDQ  D:  11/22/2003  T:  11/23/2003  Job:  [id number]

## 2010-11-06 NOTE — Op Note (Signed)
NAMESAIGE, Karen [name]               ACCOUNT NO.:  [id number]   MEDICAL RECORD NO.:  [id number]          PATIENT TYPE:  AMB   LOCATION:  SDC                           [facility]ACILITY:  [facility]   PHYSICIAN:  Phil D. Okey Dupre, M.D.     DATE OF BIRTH:  11/22/1969   DATE OF PROCEDURE:  08/12/2004  DATE OF DISCHARGE:                                 OPERATIVE REPORT   PROCEDURES:  1.  Examination under anesthesia.  2.  Hysteroscopic examination.  3.  Dilatation and curettage.  4.  Diagnostic laparoscopy with lysis of pelvic adhesions.   PREOPERATIVE DIAGNOSES:  1.  Chronic pelvic pain.  2.  Metrorrhagia.   POSTOPERATIVE DIAGNOSES:  1.  Endometrial polyps.  2.  Pelvic adhesions probably secondary to chronic pelvic inflammatory      disease.   SURGEON:  Javier Glazier. Rose, M.D.   ESTIMATED BLOOD LOSS:  10 mL.   POSTOPERATIVE CONDITION:  Satisfactory.   SPECIMENS SENT FOR PATHOLOGIC DIAGNOSIS:  Endometrial curettings, normal.  These were removed with the curettage.  There were significant pelvic  adhesions in the cul-de-sac to both adnexa which tended to trap both ovaries  in a mass of adhesions and __________ normal.  The fallopian tubes, however,  were somewhat twisted and held in place by the adhesions.   PROCEDURE IN DETAIL:  Under satisfactory general anesthesia, the patient in  the dorsal lithotomy position, the perineum, abdomen, and vagina were  prepped and draped in the usual sterile manner.  Bimanual pelvic examination  under anesthesia revealed an uterus anterior, normal size, shape, and  consistency.  Adnexa palpated is normal.  Genitalia was normal.  BUS was  within normal limits.  Vagina was clean and well irrigated.  The cervix was  clean and parous and up to 7 cm.  The cervical os dilated to a #7 Hager  dilator.  A hysteroscope was inserted into the 30 degree hysteroscope in to  the uterine cavity using normal saline as a dilating medium and the uterine  cavity was easily visualized in  its entirety and with the exception of two  small endometrial polyps which were removed with curettage.  The uterine  cavity was normal.  The scope was removed.  The uterine cavity was explored  with polyp forceps followed by curettage.  A small transverse incision was  made just below the umbilicus.  Approximately 3 L of carbon dioxide was  slowly insufflated in the peritoneal cavity with equal tympany occurring  over the entire abdominal wall.  The Veress needle was removed, the  laparoscope and trocar inserted into the peritoneal cavity.  The trocar was  removed from the sleeve.  The laparoscope inserted and the findings were as  aforementioned.  Using a long probe followed by scissors, many of the  adhesions were cut up and removed.  My expectation is recurrence will  probably take place.  The upper abdomen was examined and found to be within  normal limits.  The sleeve removed and incision closed with 3-0 Vicryl  suture on a traumatic needle.  This was used for  the fascia, subcutaneous.  Sterile dressing was applied.  The tenaculum and speculum were removed from  the vagina.  The patient was transferred to recovery room in satisfactory  condition having tolerated the procedure well.   My plan is to see the patient in 2 weeks, discuss the possibility of  hysterectomy if the pain continues as I expect it will.      PDR/MEDQ  D:  08/12/2004  T:  08/12/2004  Job:  [id number]

## 2010-11-06 NOTE — H&P (Signed)
Karen [name], Karen [name]               ACCOUNT NO.:  [id number]   MEDICAL RECORD NO.:  [id number]          PATIENT TYPE:  IPS   LOCATION:  0507                          [facility]ILITY[facility]   PHYSICIAN:  Geoffery Lyons, M.D.      DATE OF BIRTH:  11/22/1969   DATE OF ADMISSION:  10/25/2004  DATE OF DISCHARGE:                         PSYCHIATRIC ADMISSION ASSESSMENT   TIME OF EXAM:  1:30 p.m.   IDENTIFYING INFORMATION:  This is a 45 year old Hispanic female who is  married.  This is a voluntary admission.  This assessment is done with the  assistance of a Spanish-speaking interpreter.   HISTORY OF PRESENT ILLNESS:  This patient with a history of depression and  two prior suicide attempts presented in the emergency room after taking  approximately 8 pills of amitriptyline 25 mg, meperidine and Phenergan.  It  is unclear exactly how many she took of each one.  The patient, however,  denies this today and says that she overdosed only on the meperidine but not  on the tricyclics.  She took the overdose after arguing with her husband,  son and brother-in-law.  The patient says that they both had been drinking  for a couple of days prior to this.  She felt abandoned, frustrated with  their drinking, and she suspects that they had also been drugging.  She felt  like she had no support.  She was trying to discipline her son who is 2  years old who she had been trying to keep away from drugs and alcohol, and  received no assistance from her husband.  Also her brother-in-law who now  lives in the home drinks daily.  She feels that this is a bad influence on  the son.  She was hospitalized briefly, not being clear if she had taken  amitriptyline or not and was subsequently medically cleared with no EKG  changes.  She reports her primary stressor is a lack of respect from her  son, and he feels that he can drink around the house, and she lacks support  from her husband in reinforcing behavior guidelines.   PAST PSYCHIATRIC HISTORY:  This is the patient's second or third [facility] admission.  She does have a history of prior suicide attempts  in May 2005 and most recently in January 2006.  She has been previously  diagnosed with depression and anxiety, and in addition to current  medications had also been prescribed Seroquel 25 mg 2-3 times daily, which  she is not currently taking.   SOCIAL HISTORY:  Patient is from [place] and has been in this country for  several years.  She has a 60 year old daughter who continues to live in  [place], and a 15 year old son who came here a couple of years ago.  She  lives at home with her 74 year old son, her husband and her brother-in-law  who is now in the home.  There are no legal problems.  Loves her family.  She is frustrated with the substance abuse on the part of family members.   FAMILY HISTORY:  Unclear.  ALCOHOL AND DRUG HISTORY:  Patient herself denies any substance abuse.   MEDICAL HISTORY:  Patient has a primary gynecologist that follows her for  chronic problems with menometrorrhagia.  Her other current diagnoses include  history of migraine headache, onychomycosis, COPD, and she is status post  polysubstance overdose.   PAST MEDICAL HISTORY:  Remarkable for no history of seizures, blackouts or  memory loss.  She has a history of uterine polyps.   CURRENT MEDICATIONS:  1.  Imitrex 100 mg p.r.n. for headache.  2.  Verapamil 80 mg t.i.d. to prevent headaches.  3.  Zoloft 150 mg daily.  4.  Seroquel 25 mg p.o. b.i.d. which apparently she had in the past but is      not currently taking.  5.  Famotidine 1 daily.  6.  Elavil 25 mg once daily at bedtime.  7.  She had recently been given 50 mg meperidine and promethazine 25 mg      daily after having some lower abdominal pain for which her OB/GYN was      treating her.  8.  She was placed on a PPI over at [facility] where she was      medically cleared.  The  PPI she was started on Protonix 40 mg daily.  9.  Patient has also been taking Klonopin 1 mg p.o. b.i.d.   DRUG ALLERGIES:  PERCOCET and DILAUDID.  Reaction is itching and hives.   POSITIVE PHYSICAL FINDINGS:  Patient's full physical examination was done on  the medicine service and is noted in the record.  Today she appears to be  healthy, well groomed Hispanic female who is in no distress.  Pleasant and  cooperative.  Height 5 feet 4 inches tall, weight 135 pounds, temperature  98.4, pulse 90, respirations 20, blood pressure 149/90 at the time of  admission.  No acute distress, pleasant and cooperative.  WBC 9.5,  hemoglobin 12.9, hematocrit 37.7, platelets 429,000.  Electrolytes, sodium  138, potassium 3.6, chloride 108, CO2 21, BUN 12, creatinine 0.9, glucose  108.  Urine drug screen was positive for benzodiazepines and tricyclics.  Urine pregnancy test was negative.  Liver enzymes were all within normal  limits.  SGOT 16, SGPT 20.  Acetaminophen and salicylate levels were  negative.  Urinalysis was positive for some blood.   MENTAL STATUS EXAM:  This is a fully alert female who is pleasant and  cooperative with an appropriate affect, calm, focused, pleasant and  appropriate.  Speech is normal in pace, tone and amount.  Mood is euthymic  at this point does reveal some frustration and irritability especially when  discussing the situation with her son and her husband and their  relationships and drinking.  Thought process is logical and coherent.  It is  clear that she had some vague suicidal thoughts which at this point she  states that she regrets, no homicidal thoughts, no evidence of paranoia,  flight of ideas or other signs of psychosis.  Cognitively she is intact and  oriented x 3.  Insight is satisfactory.  Impulse control and judgment within  normal limits.  Intelligence is within normal limits.   ADMISSION DIAGNOSES:   AXIS I: 1.  Rule out major depression, recurrent,  severe.  2.  Rule out anxiety disorder, not otherwise specified.   AXIS II:  Deferred.   AXIS III:  1.  Migraine headache.  2.  Status post polypharmacy overdose.  3.  Menometrorrhagia not otherwise specified.  AXIS IV:  Severe family discord.   AXIS V:  Current 35, past year 50.   PLAN:  Voluntarily admit the patient with every 15 minutes checks in place.  At this point she has not had any further tachycardia.  We will get a  baseline EKG and request a family session with her husband and son.  She has  not had any arrhythmia to date.  We are going to increase her amitriptyline  to 50 mg p.o. q.h.s.  We are going to hold her verapamil for right now and  plan on restarting that tomorrow.  She has had no signs of headache.  She  agrees that a family session with her son is appropriate, and will go from  there.  At this point we are going to continue her Zoloft at 150 mg daily.  Estimated length of stay is 5-6 days.      MAS/MEDQ  D:  10/27/2004  T:  10/27/2004  Job:  [id number]

## 2010-11-06 NOTE — Group Therapy Note (Signed)
Karen [name], Karen [name]:  [id number]   MEDICAL RECORD NO.:  [id number]                   PATIENT TYPE:  OUT   LOCATION:  [facility]                           FACILITY:  [facility]   PHYSICIAN:  Argentina Donovan, M.D.                   DATE OF BIRTH:  11/22/1969   DATE OF SERVICE:  01/03/2003                                    CLINIC NOTE   The patient is a 46 year old gravida 2, para 2-0-0-2, whose youngest child  is 40 years old.  The patient has been complaining of pelvic pain for three  to four years, which is continual and in both lower quadrants but mild  except with intercourse, where it is markedly increased.  She also complains  of this pain, which is mild and persistent all the time, is markedly  increased during the two days of her period.  The patient has been seen in  the medical clinic for depression and migraines and is on Klonopin,  propranolol, amitriptyline and Imitrex.  She also complains of some breast  tenderness in the 10-14 days before her period, which is there all the time  but not as bad as it is during those days.  She has had a history of  aspiration of an ovarian cyst and a history of fibrocystic breast disease.   PHYSICAL EXAMINATION:  External genitalia is normal.  BUS within normal  limits.  The vagina is clean and well-rugated.  The uterus is a normal size,  shape, consistency, can be moved quite easily without pain, and the adnexa  are normal.  Pressure and quick movement of the uterus duplicate the pain  she has with intercourse but is not quite as bad.  The estimate is that she  has a first degree prolapse, and on coughing will see a first degree  cystocele although the patient is not symptomatic for this.  The patient  also complains of a lack of sexual desire and ability to reach orgasm.   My impression is that this patient is having symptoms due to her medication  as well as depressive symptoms along with the mild  prolapse.  She is  probably hitting the cervix and sending shock waves to the ovaries during  intercourse and especially pronounced in the days before and during her  period.  To be complete will get a sonogram, although I expect it will be  normal, and we are going to put the patient on Yasmin to see if this helps  the symptoms at all.  As a last resort we could do a laparoscopy, which I  think would probably be normal.   IMPRESSION:  1. Chronic pelvic pain, mild, increased with intercourse, around the time of     the period especially.  2. Dyspareunia.  3. Dysmenorrhea.  4. Mild premenstrual symptom with depression and migraines.   PLAN:  1. Ultrasound.  2. Yasmin.  3. Eventual laparoscopy.                                               Argentina Donovan, M.D.    PR/MEDQ  D:  01/03/2003  T:  01/04/2003  Job:  [id number]

## 2010-11-06 NOTE — Op Note (Signed)
Karen [name], Karen [name]               ACCOUNT NO.:  [id number]   MEDICAL RECORD NO.:  [id number]          PATIENT TYPE:  AMB   LOCATION:  ENDO                         FACILITY:  [facility]   PHYSICIAN:  Graylin Shiver, M.D.   DATE OF BIRTH:  11/22/1969   DATE OF PROCEDURE:  02/17/2005  DATE OF DISCHARGE:                                 OPERATIVE REPORT   PROCEDURE:  Esophagogastroduodenoscopy.   INDICATIONS FOR PROCEDURE:  Abdominal pain, rule out upper GI abnormalities.   Informed consent was obtained after explanation of the risks of bleeding,  infection and perforation.   PREMEDICATION:  Fentanyl 100 mcg IV, Versed 10 mg IV.   PROCEDURE:  With the patient in the left lateral decubitus position, the  Olympus gastroscope was inserted into the oropharynx and passed into the  esophagus. It was advanced down the esophagus, then into the stomach and  into the duodenum. The second portion and bulb of the duodenum looked  normal. The stomach had normal-appearing mucosa.  The fundus and cardia  looked normal.  The esophagus looked normal in its entirety. The  esophagogastric junction was at approximately 39 cm. She tolerated the  procedure well without complications.   IMPRESSION:  Normal esophagogastroduodenoscopy.   There is nothing on this exam to explain the patient's abdominal pain.   The patient will follow-up with Dr. Lowella Bandy in the [facility] as well as keep her follow-up gynecological appointment.           ______________________________  Graylin Shiver, M.D.     SFG/MEDQ  D:  02/17/2005  T:  02/17/2005  Job:  [id number]   cc:   C. Ulyess Mort, M.D.  [address]  [place]  [place] [address]  Fax: [phone]
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
## 2010-11-06 NOTE — Group Therapy Note (Signed)
Karen [name], CUNANAN NO.:  [id number]   MEDICAL RECORD NO.:  [id number]          PATIENT TYPE:  WOC   LOCATION:  [facility]                   [facility]ACILITY[facility]   PHYSICIAN:  Argentina Donovan, MD        DATE OF BIRTH:  11/22/1969   DATE OF SERVICE:  07/28/2004                                    CLINIC NOTE   REASON FOR VISIT:  This patient is a 45 year old gravida 2 para 2-0-0-2, her  youngest child is 110 years old.  She has had 3-4 years of chronic pelvic  pain throughout the entire month but much worse during her period and during  coitus.  In addition to this, in the last few months she has developed  intermenstrual spotting that occurs throughout most of the cycle, even  though she is not having a period.  Other than this she is in very good  health although she has a history of migraine and depression and is on  amitriptyline, __________, Imitrex, promethazine, and Ambien.  The patient  denotes the pain as being chronic, crampy in nature, and situated  suprapubic, also radiating to both left and right lower quadrant.   EXAMINATION:  Her abdomen is soft, flat, nontender, no masses nor  organomegaly.  BUS within normal limits.  The vagina is clean and well  rugated although tender in the anterior vaginal wall and somewhat tender in  motion of the uterus, but mainly when we push upon the anterior wall.  The  cul-de-sac is free.  The uterus is of normal size, shape, consistency.  Adnexa is normal and the ultrasound done slightly over a year ago was  normal.   We discussed with the patient laparoscopy before.  She is now prepared to  get this done.  We are scheduling her a D&C hysteroscopy and diagnostic  laparoscopy.  In addition to this, the patient's youngest daughter as we  have stated is 24 years old.  She has never used any contraception since  that time, has had the same partner, and never gotten pregnant.  She weighs  134 pounds.   IMPRESSION:  1.  Chronic  pelvic pain.  2.  Metrorrhagia.   The patient is being scheduled for surgery.      PR/MEDQ  D:  07/28/2004  T:  07/28/2004  Job:  [id number]

## 2010-11-06 NOTE — Discharge Summary (Signed)
NAMEMERTICE, UFFELMAN               ACCOUNT NO.:  [id number]   MEDICAL RECORD NO.:  [id number]          PATIENT TYPE:  INP   LOCATION:  9318                          [facility]   PHYSICIAN:  Phil D. Okey Dupre, M.D.     DATE OF BIRTH:  11/22/1969   DATE OF ADMISSION:  03/22/2005  DATE OF DISCHARGE:                                 DISCHARGE SUMMARY   The patient is a 45 year old Hispanic female who underwent total abdominal  hysterectomy and left salpingo-oophorectomy on the day of admission for  chronic pelvic pain and pelvic adhesions.  She is also troubled by severe  migraine headaches, irritable bowel syndrome and depression.  She is on  numerous medications for this, and I am discharging  her on Ultram, Motrin,  activated charcoal and Zantac.  She is to return to the clinic in two weeks.  I had the interpreter here, discussed in detail the course and postoperative  plan, her activity,diet and follow-up.  We have also had a consultation with  his son and her husband, who she lives with, and of the large part they play  in triggering her bouts of migraine and irritable bowel, and probably are  not 100% the cause of the occurrences but they play a large part in the  patient's stress situation, which they can do something about.   DISCHARGE EXAMINATION:  The lungs are clear.  The abdomen is soft, flat,  nontender.  The wound is clean.  Staples have been removed.  The extremities  looked normal.  There is no sign of any active bleeding.   DISCHARGE DIAGNOSIS:  Chronic pelvic pain with pelvic adhesions.   SIGNIFICANT LABS:  Hematocrit and hemoglobin were at discharge __________  and 29.7 that were at 12.5 and 36.3 at admission.  White count was been  normal throughout her stay           ______________________________  Javier Glazier. Okey Dupre, M.D.     PDR/MEDQ  D:  03/25/2005  T:  03/25/2005  Job:  [id number]

## 2010-11-06 NOTE — H&P (Signed)
NAMERETHER, RISON NO.:  [id number]   MEDICAL RECORD NO.:  [id number]                   PATIENT TYPE:  IPS   LOCATION:  0503                                 [facility]ILITY[facility]   PHYSICIAN:  Geoffery Lyons, M.D.                   DATE OF BIRTH:  11/22/1969   DATE OF ADMISSION:  10/24/2003  DATE OF DISCHARGE:                         PSYCHIATRIC ADMISSION ASSESSMENT   IDENTIFYING INFORMATION:  A 45 year old Hispanic female, voluntarily  admitted on Oct 24, 2003.   HISTORY OF PRESENT ILLNESS:  The patient presents with a history of an  intentional overdose on Klonopin, taking approximately 15 tablets.  The  patient reports her stressors are with her son, having conflict.  The  patient states with the overdose she wanted to die.  The patient's  responses are gained with interpreter's services as the patient speaks  mostly [place].  The patient states she has been arguing with her son.  Her  son wants more freedom.  He has been dating a 45 year old girl which she is  very concerned about.  She states her son is sexually active and concerned.  She states her husband is not supportive of setting the limits with her son.  Her sleep has been satisfactory with sleeping aid.  Her appetite has been  decreased, lost about 15 pounds.  Denies any psychotic symptoms.  Currently  denying any suicidal ideation.   PAST PSYCHIATRIC HISTORY:  First admission to [facility].  Has  a history of depression and anxiety.  Has been on multiple antidepressants  in the past.  The patient states she had a suicide attempt 7 or 8 years ago.  She states she is unsure but she was hospitalized.   SOCIAL HISTORY:  She is a 45 year old married Hispanic female, married for 2  years, first marriage.  Has 2 children ages 97 and 59.  Lives with her son  and her husband.  Her daughter is in [place].  The patient is originally  from [place].  She is not working.   FAMILY HISTORY:   Unclear.   ALCOHOL DRUG HISTORY:  Nonsmoker, no drugs or alcohol use.   PAST MEDICAL HISTORY:  Primary care physician is Dr. Everlene Other.  Medical  problems are migraines, reported some problems with elevated blood pressure.   MEDICATIONS:  Has been on Klonopin, amitriptyline, propranolol for  migraines, Ambien and Imitrex for migraine headache control.   DRUG ALLERGIES:  No known allergies.   PHYSICAL EXAMINATION:  The patient was assessed at [facility] Emergency  Department.  The patient appears well-nourished, in no acute distress.  97.5, 63 heart rate, 20 respirations, blood pressure is 117/79, 125 pounds.  Urine drug screen was negative.  Urine pregnancy test is negative.  Hemoglobin 11.4, hematocrit 33.5, TSH is 1.245.  Acetaminophen level less  than 10, salicylate level less than 4.   MENTAL STATUS EXAM:  Alert, cooperative female, fair eye contact, casually  dressed, cooperative for interview.  Speech is clear. The patient is  speaking mostly Spanish.  The patient feels anxious, the patient does not  appear as such.  Thought processes are coherent.  There appears to be no  evidence of psychosis.  Cognitive function intact.  Memory is good, judgment  and insight is fair.   ADMISSION DIAGNOSES:   AXIS I:  Major depression, single episode.   AXIS II:  Deferred.   AXIS III:  Migraines.   AXIS IV:  Problems with primary support group.   AXIS V:  Current 35, past year 21.   PLAN:  Admission for intentional overdose.  Contract for safety, stabilize  mood and thinking.  We will resume her medications.  Will continue to have  interpreter services for groups and to obtain information.  We will need a  family session prior to discharge.   TENTATIVE LENGTH OF CARE:  4-5 days.     Landry Corporal, N.P.                       Geoffery Lyons, M.D.    JO/MEDQ  D:  10/26/2003  T:  10/26/2003  Job:  [id number]
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
## 2010-11-06 NOTE — Group Therapy Note (Signed)
NAMEGRACE, VALLEY NO.:  [id number]   MEDICAL RECORD NO.:  [id number]          PATIENT TYPE:  WOC   LOCATION:  [facility]                   [facility]ACILITY[facility]  [facility]   PHYSICIAN:  Argentina Donovan, MD        DATE OF BIRTH:  11/22/1969   DATE OF SERVICE:  01/05/2005                                    CLINIC NOTE   REASON FOR VISIT:  The patient is a 45 year old gravida 2 para 2-0-0-2 who  has had chronic pelvic pain and dysfunctional uterine bleeding for some time  with chronic pelvic adhesions. She underwent laparoscopy and D&C. The  symptoms persisted. The patient said she cannot live with the pain. She has  had several suicide attempts in the past and is on a multitude of drugs. I  told the patient we are going to do a total abdominal hysterectomy, possible  bilateral S&O, and we discussed this with the translator, Veryl Speak, present.  Both her deliveries have been vaginal. We have given her attempts to control  the pain with Depo-Provera but to no avail.   DIAGNOSIS:  Chronic pelvic pain and pelvic adhesions.       PR/MEDQ  D:  01/05/2005  T:  01/06/2005  Job:  [id number]
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
## 2010-11-06 NOTE — Discharge Summary (Signed)
Karen [name]:  [id number]   MEDICAL RECORD NO.:  [id number]          PATIENT TYPE:  IPS   LOCATION:  0507                          FACILITY:  [facility]   PHYSICIAN:  Geoffery Lyons, M.D.      DATE OF BIRTH:  11/22/1969   DATE OF ADMISSION:  10/25/2004  DATE OF DISCHARGE:  10/28/2004                                 DISCHARGE SUMMARY   CHIEF COMPLAINT AND PRESENTING ILLNESS:  This was the second or third  admission to [facility]  for this 45 year old female,  married, voluntarily admitted.  History of depression, two prior suicide  attempts.  Presented in the emergency room after taking 8 pills of  Amitriptyline, Methedrine and Phenergan.  She took the overdose after  arguing with her husband and brother-in-law.  They both had been drinking  for a couple of days prior to this.  She felt abandoned and frustrated with  their drinking and she suspected that they had also been drugging.  She  felt like she had no support.  She was trying to discipline her son who is  1 years old and whom she has been trying to keep away from drugs and  alcohol and receives no help from her husband.  Also her brother-in-law who  was living in house drinks daily.  Endorsed that she felt he was a bad  influence on the son.  Endorsed that the family stressor was lack of respect  from her son who feels that he can drink around the house, lacking the  support from her husband.   PAST PSYCHIATRIC HISTORY:  Second or third Behavioral Health admission.  History of prior suicide attempt May 2005.  Most recently in January 2006.  Diagnosed with depression and anxiety.   ALCOHOL AND DRUG HISTORY:  Denies any active substance use.   PAST MEDICAL HISTORY:  Migraine headaches, COPD.   MEDICATIONS:  Imitrex 100 as needed for headaches, Verapamil 80 mg 3 times a  day, Zoloft 150 mg per day, Seroquel 25 twice a day, not currently taking.  Famotidine 1 daily, Elavil 25 once  daily at night.  Recently given 50 mg of  meperidine and promethazine 25, Klonopin 1 mg twice a day.   PHYSICAL EXAMINATION:  Performed, failed to show any acute findings.   LABORATORY WORKUP:  CBC within normal limits.  Blood chemistries:  Glucose  108, liver enzymes within normal limits.  TSH 4.033.  Urine pregnancy test  negative.   MENTAL STATUS EXAM:  Revealed a fully alert female, pleasant, cooperative,  appropriate affect, calm, focused, pleasant, appropriate.  Speech was normal  in pace, tone and amount.  Mood was euthymic, but she did present with some  frustration and irritability when discussing her son and her husband.  Thought processes were logical, coherent and relevant.  Vague suicide  ideation, no active plan at the time of the evaluation.  Some remorse for  what she did.  No delusions.  No active suicidal or homicidal ideation.  No  hallucinations.  Cognition well preserved.   ADMISSION DIAGNOSES:  AXIS I:  1.  Major depression, recurrent.  2.  Anxiety disorder not otherwise specified.   AXIS II:  No diagnosis.   AXIS III:  Migraine headaches, status post overdose, menorrhagia, chronic  obstructive pulmonary disease.   AXIS IV:  Moderate.   AXIS V:  Global assessment of function upon admission 35, highest global  assessment of function in past year 70-75.   COURSE IN HOSPITAL:  She was admitted and started on individual and group  psychotherapy.  She was placed on her medications, Zoloft 100 mg in the  morning, 50 at bedtime, Elavil 50 at bedtime, Klonopin 1 mg in the morning  and 1 mg at night, Topamax 25 in the morning and 25 at bedtime, and she was  given Ambien 10 at bedtime for sleep.  She was also given albuterol 2 puffs  every 8 hours as needed, and she was restarted on Verapamil 80 mg twice a  day and at night.  She had endorsed increased stress coming from her son's  use of alcohol and cocaine and the husband relapsing on alcohol and not  helping  her deal with the son's behaviors.  Felt she had no support, took  the overdose, felt she could not handle it.  Stated that she was going to be  willing to give this a try again and if not she had a plan to go to [place].  There was a family session with her husband and son.  She was open about her  concerns about her son's behavior.  She was able to share with her husband  and they were open about the issues.  They were willing to work on their  situation.  On May 10 she was in full contact with reality.  She endorsed no  suicidal ideas, no homicidal ideas, no hallucinations, no delusions.  Encouraged by her husband and son's response, wanting to work things out.  She was willing and motivated to pursue further treatment and work on the  relationship.  If not, she did endorse that she would leave to [place] where  she has family.  Felt that having this back-up plan will help her to keep  from doing anything to hurt herself.   DISCHARGE DIAGNOSES:   AXIS I:  1.  Major depression, recurrent.  2.  Anxiety disorder not otherwise specified.   AXIS II:  No diagnosis.   AXIS III:  Migraine headaches, menorrhagia, chronic obstructive pulmonary  disease.   AXIS IV:  Moderate.   AXIS V:  Global assessment of function upon discharge 55-60.   DISCHARGE MEDICATIONS:  1.  Zoloft 150 mg per day.  2.  Topamax 25 twice a day.  3.  Klonopin 1 mg twice a day.  4.  Elavil 50 mg at night.  5.  Verapamil 80 mg twice a day and at bedtime.   DISPOSITION:  Follow up [facility].       IL/MEDQ  D:  11/18/2004  T:  11/18/2004  Job:  [id number]

## 2010-11-06 NOTE — Group Therapy Note (Signed)
NAMEMATILYNN, DACEY NO.:  [id number]   MEDICAL RECORD NO.:  [id number]          PATIENT TYPE:  WOC   LOCATION:  [facility]                   FACILITY:  [facility]   PHYSICIAN:  Argentina Donovan, MD        DATE OF BIRTH:  11/22/1969   DATE OF SERVICE:                                    CLINIC NOTE   HISTORY OF PRESENT ILLNESS:  The patient is a 45 year old Hispanic female  who underwent total abdominal hysterectomy for chronic pelvic pain in early  October.  She had significant pelvic adhesions which were probably reformed  postsurgery.  She also suffers from irritable bowel syndrome, migraine  headaches and probable somatization causing significant pelvic pain.  She  had a recent CAT scan which was completely normal with the exception of the  normal thickening between the bladder and pelvis that you see after  hysterectomy as healing occurs.  She stated she was on Zelnorm through a  previous physician here, but states to me that she was not taking it so I am  going to give her a prescription for it.  She has been worked up by GI, and  they found no organic disease.  From our point of view, she will never need  any more Pap smears.  She is discharged postoperatively, and at her request,  I am writing a note to her internist telling him that she does not need  further Pap smears.           ______________________________  Argentina Donovan, MD     PR/MEDQ  D:  05/20/2005  T:  05/20/2005  Job:  [id number]

## 2010-11-06 NOTE — Group Therapy Note (Signed)
Karen [name], Karen [name]:  [id number]   MEDICAL RECORD NO.:  [id number]          PATIENT TYPE:  WOC   LOCATION:  [facility]                   FACILITY:  [facility]   PHYSICIAN:  Argentina Donovan, MD        DATE OF BIRTH:  11/22/1969   DATE OF SERVICE:  08/27/2004                                    CLINIC NOTE   REASON FOR VISIT:  The patient is a 45 year old Hispanic gravida 2 para 2-0-  0-2 who underwent exam under anesthesia, hysteroscopic examination,  dilatation and curettage, diagnostic laparoscopy with lysis of pelvic  adhesions for menorrhagia and chronic pelvic pain on August 12, 2004. The  abdominal incision is healing very well. Her only complaint is that she is  continuing to spot or bleed very lightly. The uterus was examined and cervix  was clean and the bleeding, although very slight, was coming from the  endometrial cavity. The diagnosis was endometrial polyps, pelvic adhesions  secondary to chronic pelvic inflammatory disease. We have told the patient  that if the symptoms continue that hysterectomy would be about the only  cure, although the patient seems rather assured that she does not have  anything serious and I think can easily live with the discomfort  occasionally if it is persisting. I expect that the spotting should stop by  the next cycle. If not, we will try her on some hormone therapy to control  that. The patient is on a multitude of medications including Imitrex,  Ambien, Zoloft, amitriptyline, clonazepam, verapamil, and promethazine.   DIAGNOSES:  1.  Satisfactory examination postoperatively.  2.  Endometrial polyps.  3.  Pelvic adhesions.      PR/MEDQ  D:  08/27/2004  T:  08/27/2004  Job:  [id number]

## 2010-11-06 NOTE — Discharge Summary (Signed)
Karen [name], KINNAIRD NO.:  [id number]   MEDICAL RECORD NO.:  [id number]          PATIENT TYPE:  IPS   LOCATION:  0507                          FACILITY:  [facility]   PHYSICIAN:  Geoffery Lyons, M.D.      DATE OF BIRTH:  12/22/1965   DATE OF ADMISSION:  10/04/2005  DATE OF DISCHARGE:  10/05/2005                                 DISCHARGE SUMMARY   CHIEF COMPLAINT AND PRESENT ILLNESS:  This was the third or fourth admission  to [facility] for this 45 year old Hispanic female  voluntarily admitted.  History of apparent overdose.  Family conflict over  frustration with husband and some alcohol use.  Denies any substance abuse  herself.  Compliant with medications.  Sleeping well.   PAST PSYCHIATRIC HISTORY:  Third or fourth time.  Last admission May of  2006.  History of overdose.   ALCOHOL/DRUG HISTORY:  Denies active alcohol or drug use.   MEDICAL HISTORY:  Migraines and irritable bowel syndrome.   MEDICATIONS:  Zoloft 100 mg, 1/2 in the morning, Elavil 50 mg per day,  Topamax 50 mg at night, albuterol 2 puffs as needed, Imitrex 50 mg for  headache, Ambien 10 mg at night for sleep, Darvocet 1 every six hours as  needed, Colace 200 mg in the morning, Klonopin 1 mg twice a day.   PHYSICAL EXAMINATION:  Performed and failed to show any acute findings.   LABORATORY DATA:  CBC with white blood cells 9.4, hemoglobin 12.2.  Blood  chemistry with sodium 140, potassium 3.6, glucose 119.  Liver enzymes with  SGOT 19, SGPT 14, total bilirubin 0.4, TSH 1.238.  Urine pregnancy test  negative.   MENTAL STATUS EXAM:  Alert, cooperative female with good eye contact.  Able  to communicate.  Speech clear, normal rate, tempo and production.  Mood sad.  Affect constricted.  Thought processes logical, coherent and relevant.  Began dealing with the events that led to the hospitalization, the conflict  at home but denied any active suicidal or homicidal ideation.   Cognition was  well-preserved.   ADMISSION DIAGNOSES:  AXIS I:  Major depression, recurrent.  AXIS II:  No diagnosis.  AXIS III:  Asthma, migraines.  AXIS IV:  Moderate.  AXIS V:  GAF upon admission 40; highest GAF in the last year 65-70.   HOSPITAL COURSE:  She was admitted.  She was started in individual and group  psychotherapy.  She was placed on her medications.  She says she was not  suicidal, still dealing with the ongoing stress from conflict with the  husband.  Claimed that he is using drugs and alcohol.  Also coping with the  fact that the son is also drinking.  She was able to get a friend to move in  with them and, when the friend leaves, she is going to go with her.  She  says that she had decided that she is going to separate as this is not  really working and attempts in the past to make things work have failed.  Endorsed that she is  ready to go.  She endorsed she will not try to hurt  herself.  Has enough support through her family of origin as well as her  friends.   DISCHARGE DIAGNOSES:  AXIS I:  Major depression, recurrent.  AXIS II:  No diagnosis.  AXIS III:  Migraine headaches, asthma.  AXIS IV:  Moderate.  AXIS V:  GAF upon discharge 55-60.   DISCHARGE MEDICATIONS:  1.  Colace 200 mg at 9 p.m.  2.  Zelnorm 6 mg daily in the morning.  3.  Topamax 25 mg, 2 at night.  4.  Klonopin 1 mg twice a day.  5.  Ambien 10 mg at night for sleep.  6.  Elavil 50 mg, 1 at night.  7.  Verapamil 80 mg in the morning and at bedtime.  8.  Zoloft 100 mg daily in the morning and 50 mg at night.  9.  Albuterol 2 puffs every four hours as needed.  10. Darvocet-N 100/650 mg, 1 every six hours as needed.  11. Imitrex 25 mg as needed for headache.   FOLLOW UP:  [facility].      Geoffery Lyons, M.D.  Electronically Signed     IL/MEDQ  D:  10/20/2005  T:  10/21/2005  Job:  [id number]

## 2010-11-06 NOTE — Group Therapy Note (Signed)
   Karen, [name]:  [id number]   MEDICAL RECORD NO.:  [id number]                   PATIENT TYPE:  OUT   LOCATION:  [facility]                           FACILITY:  [facility]   PHYSICIAN:  Karen Donovan, MD                     DATE OF BIRTH:  11/22/1969   DATE OF SERVICE:  03/05/2003                                    CLINIC NOTE   HISTORY OF PRESENT ILLNESS:  The patient is a 45 year old Spanish speaking  Hispanic gravida 2, para 2-0-0-2 with chronic pelvic pain.  Please see  previous clinic note.  Is on many medications for depression and migraines  and had an essentially normal gynecological examination with perhaps a first  degree cystocele.  Her main complaint has been chronic pelvic pain,  especially with dyspareunia in days before her period.  Ultrasound failed to  reveal any cause of the pain and we discussed with the patient the  possibility of laparoscopic examination, although I feel that this is strong  somatization in this patient.  She is going to think it over, discuss with  her family, and contact me either directly or through the interpreter at  which time I would sign her up for a diagnostic laparoscopy.   IMPRESSION:  Chronic pelvic pain of unknown etiology.                                               Karen Donovan, MD    PR/MEDQ  D:  03/05/2003  T:  03/06/2003  Job:  [id number]

## 2010-11-06 NOTE — Op Note (Signed)
Karen [name], Karen [name]               ACCOUNT NO.:  [id number]   MEDICAL RECORD NO.:  [id number]          PATIENT TYPE:  INP   LOCATION:  9399                          [facility]   PHYSICIAN:  Phil D. Okey Dupre, M.D.     DATE OF BIRTH:  11/22/1969   DATE OF PROCEDURE:  03/22/2005  DATE OF DISCHARGE:                                 OPERATIVE REPORT   PROCEDURE:  Total abdominal hysterectomy and left salpingo-oophorectomy.   PREOPERATIVE DIAGNOSIS:  Chronic pelvic pain and pelvic adhesions   POSTOPERATIVE DIAGNOSIS:  Chronic pelvic pain and pelvic adhesions.   SURGEON:  Dr. Okey Dupre.   FIRST ASSISTANT:  Dr. Lauralee Evener.   ANESTHESIA:  General.   SPECIMENS:  To lab, uterus with left tube and ovary.   ESTIMATED BLOOD LOSS:  300 cc.   POSTOPERATIVE CONDITION:  Satisfactory.   OPERATIVE FINDINGS:  The uterus of normal size, shape, consistency with  thickening and scarring in the bladder flap area of the lower uterine  segment. Also cul-de-sac adhesions, omental to uterine and omentum to cul-de-  sac adhesions and adhesions involving the left ovary and tube. The right  ovary was clean and seemed to be free of adhesions at this time, so was  decided to leave that ovary. The pelvic adhesions were dissected by sharp  and blunt dissection. Several omental areas were clamped, divided and  ligated with one chromic free ties. The straight Heaney clamps were placed  lateral to the uterus and across the fallopian tubes, mesosalpinx and down  to include the round ligament; this was for traction. The round ligaments  were then ligated with 0 chromic catgut suture ligature, divided the  anterior leaf of the broad ligament, opened anterior parallel to the uterus  and extending around to the anterior superior portion of the cervix. Bladder  flap was made by sharp dissection and the bladder pushed away the lower  uterine segment. Opening made in the avascular portion of the broad ligament  through which  Heaney clamp was placed, tissue laterally divided and the  lateral pedicle ligated with one chromic catgut suture ligatures doubly. The  uterine vessels were skeletonized, clamped, divided, doubly ligated with one  chromic catgut suture ligatures. The cardinal ligaments, once the bladder  flap was pushed away from the anterior surface of cervix, were clamped,  divided and ligated one chromic catgut suture ligature, and clamps were  placed across the lower portion of the cervix to include the apex of the  vagina and the uterus dissected away from the apex of the vagina. Angle  sutures of one chromic catgut were used in each of the lateral vaginal cuff  angles and figure-of-eights to close the vaginal cuff with the same suture  one chromic catgut. Some vessels under the bladder flap were coagulated with  hot cautery, and the area appeared dry. The left ovary was lifted with a  Babcock clamp, and with straight Haney placed across the infundibular pelvic  ligament on that side, the ovary and tube removed, and the lateral pedicle  ligated doubly with one chromic catgut suture ligatures.  The area observed  for bleeding. At this time, none was noted. The pelvis was irrigated. Upper  abdominal viscera was examined, found to be within normal limits. Tape,  instrument, sponge, needle reported correct at this time, and fascia was  closed with continuous running 0 Vicryl and an atraumatic  needle. Subcutaneous bleeders were controlled with hot cautery. The skin  edge approximation with skin staples. Dry sterile dressing was applied. The  patient transferred recovery room in satisfactory condition having tolerated  the procedure well.           ______________________________  Javier Glazier. Okey Dupre, M.D.     PDR/MEDQ  D:  03/22/2005  T:  03/22/2005  Job:  [id number]

## 2010-11-06 NOTE — Group Therapy Note (Signed)
Karen [name], Karen [name]:  [id number]   MEDICAL RECORD NO.:  [id number]          PATIENT TYPE:  WOC   LOCATION:  [facility]                   [facility]ACILITY:  [facility]   PHYSICIAN:  Argentina Donovan, MD        DATE OF BIRTH:  11/22/1969   DATE OF SERVICE:  10/20/2004                                    CLINIC NOTE   The patient is a 45 year old gravida 2, para 2-0-0-2 with a history of 2  vaginal deliveries who has had chronic pain and dysfunctional uterine  bleeding for some time.  We examined her __________ dilatation and curettage  and diagnostic laparoscopy with lysis of pelvic adhesions in February of  this year, and during the procedure, we found that both ovaries were wrapped  in adhesions, both tubes were twisted and somewhat hydrosalpinx, and the  symptoms have continued.  We talked to the patient about the possibility of  hysterectomy with or without oophorectomy, and as a last resort, try and  stop her periods with Depo-Provera which she has chosen as a method of  choice.  We are going to give her Depo-Provera 150 today, and I told her if  that if that does not work or if she is not happy with that to come back and  see [place], and we would go ahead and schedule her for a total abdominal  hysterectomy and possible bilateral salpingo oophorectomy.   IMPRESSION:  Chronic dysfunctional uterine bleeding, possible adenomyosis  with chronic pelvic inflammatory disease.      PR/MEDQ  D:  10/20/2004  T:  10/20/2004  Job:  [id number]

## 2010-11-06 NOTE — H&P (Signed)
[name], [name]               ACCOUNT NO.:  [id number]   MEDICAL RECORD NO.:  [id number]          PATIENT TYPE:  INP   LOCATION:  NA                            [facility]   PHYSICIAN:  Phil D. Okey Dupre, M.D.     DATE OF BIRTH:  11/22/1969   DATE OF ADMISSION:  03/22/2005  DATE OF DISCHARGE:                                HISTORY & PHYSICAL   CHIEF COMPLAINT:  Pain in the lower abdomen all the time.   HISTORY OF PRESENT ILLNESS:  This patient is a 45 year old Hispanic, gravida  2, para 2-0-0-2, who has had chronic pelvic pain and dysfunction uterine  bleeding for some time.  She had a laparoscopic examination with a D&C which  revealed marked chronic pelvic adhesions which enveloped the ovaries as well  as being in the cul-de-sac behind the uterus.  She states she can not live  with the pain and is being scheduled for total abdominal hysterectomy and  bilateral salpingo-oophorectomy.  We have discussed whether to take or leave  the ovaries with this patient and the findings on laparoscopic examination  which probably she will be a lot more comfortable and not need another  surgery if we take out the ovaries, and she said that she does not want to  have the ovaries left.  We told her we will supplement her hormones so she  will not go through a surgical menopause.   PAST MEDICAL HISTORY:  1.  She had two vaginal deliveries.  2.  She had a laparoscopy and a D&C for pelvic pain.  3.  She has had upper GI series and been on multiple drugs for gastritis.   The recent drugs, she is not aware of the names.  She is going to bring them  when she comes to surgery, but she says they have helped considerably.  She  is on several psychotropic drugs because of depression and at least two  suicide attempts for which she was hospitalized and the main reason for  these seems to be familial situation.   ALLERGIES:  1.  PERCOCET.  2.  DILAUDID.  Both of these gave her itching and a rash.   MEDICATIONS:  She is on many medications among them Imitrex, Zoloft,  clonazepam, __________, promethazine, ___________, albuterol inhaler,  amitriptyline, __________, and iron.  She is also taking several pain  medications that she does not know the name of but will bring her  medications in when she comes.   FAMILY HISTORY:  Her mother died of liver disease.  She does not know what  kind.  Her father, she said, died of old age in his early 69s.  She has one  older brother who is living and well.   SOCIAL HISTORY:  She does not smoke, drink, or take illicit drugs.   REVIEW OF SYSTEMS:  Significant for her gastrointestinal symptoms mentioned  above, her psychiatric symptoms mentioned above.  As far as lungs go, rare  bouts of bronchial asthma.   PHYSICAL EXAMINATION:  VITAL SIGNS:  She has a blood pressure of 113/75,  weight of 130, she is 5 foot 2 inches, she has a pulse of 84, temperature  98.6, respirations 18.  GENERAL:  She is a well-developed, well-nourished, Hispanic female in no  acute distress, although tearful when talking to her.  HEENT:  Within normal limits.  LUNGS:  Clear to auscultation percussion.  NECK:  Supple with symmetrical normal thyroid without masses or nodules.  HEART:  No murmur.  Normal sinus rhythm.  PMI the fifth ICS and MCL.  BACK:  Erect.  ABDOMEN:  Soft, flat, nontender with no guarding or rebound.  No masses.  No  organomegaly.  EXTREMITIES:  No edema.  No varices.  Deep tendon reflexes within normal  limits.  GENITALIA:  External genitalia is normal.  BUS within normal limits.  Vagina  is clean and well rugated.  The uterus is of normal size, shape, and  consistency.  The adnexa palpates normally.  RECTAL:  There are no masses.  No sign of external or internal hemorrhoids.   IMPRESSION:  Chronic pelvic pain secondary to pelvic adhesions.   PLAN:  Total abdominal hysterectomy and bilateral salpingo-oophorectomy.  We  have discussed with the  patient the possible complications especially those  related to, but not limited to, anesthetic complications, those directly  related to surgery, i.e., urinary tract, bowel injury, hemorrhage,  infection, blood clots, and pulmonary emboli.  We have also discussed  postoperative followup and activities when she went home related to diet,  activity, etcetera.           ______________________________  Javier Glazier. Okey Dupre, M.D.     PDR/MEDQ  D:  03/17/2005  T:  03/17/2005  Job:  [id number]

## 2010-12-03 ENCOUNTER — Encounter: Payer: Self-pay | Admitting: Internal Medicine

## 2010-12-03 ENCOUNTER — Ambulatory Visit (INDEPENDENT_AMBULATORY_CARE_PROVIDER_SITE_OTHER): Payer: Self-pay | Admitting: Internal Medicine

## 2010-12-03 VITALS — BP 131/82 | HR 75 | Temp 97.3°F | Resp 18 | Wt 140.4 lb

## 2010-12-03 DIAGNOSIS — M549 Dorsalgia, unspecified: Secondary | ICD-10-CM

## 2010-12-03 DIAGNOSIS — K589 Irritable bowel syndrome without diarrhea: Secondary | ICD-10-CM

## 2010-12-03 DIAGNOSIS — E781 Pure hyperglyceridemia: Secondary | ICD-10-CM | POA: Insufficient documentation

## 2010-12-03 DIAGNOSIS — F329 Major depressive disorder, single episode, unspecified: Secondary | ICD-10-CM

## 2010-12-03 DIAGNOSIS — M67919 Unspecified disorder of synovium and tendon, unspecified shoulder: Secondary | ICD-10-CM

## 2010-12-03 DIAGNOSIS — E785 Hyperlipidemia, unspecified: Secondary | ICD-10-CM | POA: Insufficient documentation

## 2010-12-03 MED ORDER — FENOFIBRATE 145 MG PO TABS: 145.0000 mg | ORAL_TABLET | Freq: Every day | ORAL | Status: DC

## 2010-12-03 MED ORDER — PROMETHAZINE HCL 12.5 MG PO TABS: 12.5000 mg | ORAL_TABLET | Freq: Four times a day (QID) | ORAL | Status: DC | PRN

## 2010-12-03 MED ORDER — DICYCLOMINE HCL 20 MG PO TABS: 20.0000 mg | ORAL_TABLET | Freq: Four times a day (QID) | ORAL | Status: DC

## 2010-12-03 MED ORDER — HYDROCODONE-ACETAMINOPHEN 5-500 MG PO TABS: 1.0000 | ORAL_TABLET | Freq: Every day | ORAL | Status: DC

## 2010-12-03 NOTE — Assessment & Plan Note (Signed)
>>  ASSESSMENT AND PLAN FOR HYPERTRIGLYCERIDEMIA WRITTEN ON 12/03/2010  3:12 PM BY VEGA-CASASNOVAS, RAMSES L, MD  Started patient on Tricor. Recheck FLP 3-6 months.

## 2010-12-03 NOTE — Assessment & Plan Note (Signed)
Stable. Per Behavioral health.

## 2010-12-03 NOTE — Assessment & Plan Note (Signed)
Stable. Will refill pain meds and follow up.

## 2010-12-03 NOTE — Assessment & Plan Note (Signed)
Started patient on Tricor. Recheck FLP 3-6 months.

## 2010-12-03 NOTE — Assessment & Plan Note (Signed)
Noncompliant with medication. Patient reinstructed to take medication as directed.

## 2010-12-03 NOTE — Assessment & Plan Note (Signed)
Referred to sports medicine for subacromial steroid injection.

## 2010-12-03 NOTE — Patient Instructions (Signed)
Please make a follow up appointment in 1 month. Take all medication as directed.
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
## 2010-12-08 ENCOUNTER — Ambulatory Visit: Payer: Self-pay | Admitting: Family Medicine

## 2010-12-08 NOTE — Progress Notes (Signed)
Pt. Here at the clinic today about her medications. Vicodin rx called to [facility]; Phenergan and Tricor called to [facility] per pt's request.

## 2010-12-09 ENCOUNTER — Telehealth: Payer: Self-pay | Admitting: *Deleted

## 2010-12-09 DIAGNOSIS — E781 Pure hyperglyceridemia: Secondary | ICD-10-CM

## 2010-12-09 MED ORDER — FENOFIBRATE 145 MG PO TABS: 145.0000 mg | ORAL_TABLET | Freq: Every day | ORAL | Status: DC

## 2010-12-09 NOTE — Telephone Encounter (Signed)
[facility] does not stock Tricor- pt does not want to get thru MAP. Can you change to Simvastatin or Pravastatin.

## 2010-12-15 NOTE — Telephone Encounter (Signed)
Tricor is not the same category as the statins.  This patient has a triglyceride >700.  Does the pharmacy carry gemfibrozil?

## 2010-12-15 NOTE — Telephone Encounter (Signed)
Pt is not a MAP pt now so she did not get Tricor; needs to be change back to Simvastatin 20mg  or Pravastatin.  Thanks

## 2010-12-16 NOTE — Telephone Encounter (Signed)
[facility] does not have Lovasa nor Niaspan; they said pt needs to re-enroll in the MAP program.  At [facility], Lovasa 1gm BID cost $117.62; Niaspa100mg  BID cost $312.68.  I called [name] and I talked to her husband, who speaks [place], about the medication/cost/re-enrolling. He translated this back to her; she [facility]-enroll in order to get Tricor.

## 2010-12-16 NOTE — Telephone Encounter (Signed)
[facility] does not have Gemfibrozil; stated maybe by next month. I called [facility] on [address] - 600mg  daily cost $14.84 an600mg  BID cost $ $28.87.

## 2010-12-16 NOTE — Telephone Encounter (Signed)
Great, thank you for all your hard work!

## 2010-12-18 ENCOUNTER — Encounter: Payer: Self-pay | Admitting: Family Medicine

## 2010-12-18 ENCOUNTER — Ambulatory Visit (INDEPENDENT_AMBULATORY_CARE_PROVIDER_SITE_OTHER): Payer: Self-pay | Admitting: Family Medicine

## 2010-12-18 VITALS — BP 120/79 | HR 71 | Ht 63.0 in | Wt 130.0 lb

## 2010-12-18 DIAGNOSIS — M719 Bursopathy, unspecified: Secondary | ICD-10-CM

## 2010-12-18 DIAGNOSIS — M67919 Unspecified disorder of synovium and tendon, unspecified shoulder: Secondary | ICD-10-CM

## 2010-12-18 NOTE — Progress Notes (Signed)
  Subjective:    Patient ID: Karen [name], female    DOB: September 30, 1965, 45 y.o.   MRN: [id number]  HPI  Left shoulder pain for her than one year. No specific injury. 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 Has had no prior injury to her left shoulder and no surgeries. Has no weakness in the arm or hand, the tingling she is right-hand dominant. Mopping is the thing that makes it hurt most.  Review of Systems Denies fever.    Objective:   Physical Exam     GENERAL: Well-developed female no acute distress. Normal body habitus. SHOULDER:Shoulder without obvious defect. Full strength in:              Internal rotation--some pain with lift off test but full strength              External rotation               Supraspinatus testing but pain with AROM Distally neurovasculalry intact Impingesment signs are posiitve for [place]  [place]: Lots of calcifications in the body of both the supraspinatus and subscapularis. The infraspinatu and teres minor are intact and without edema at their insertion. There is some edema around the attachment of the supraspinatus to the humerus. There is no impingement seen on dynamic ultrasound.  INJECTION: Patient was given informed consent, signed copy in the chart. Appropriate time out was taken. Area prepped and draped in usual sterile fashion. 1 cc of kenalog plus  4 cc of lidocaine was injected into the left shoulder  using a(n) posterior approach. The patient tolerated the procedure well. There were no complications. Post procedure instructions were given.  Assessment & Plan:  Left subacromial bursitis with chronic rotator cuff issues. She has been doing some type of shoulder exercises for 3 months. We will start her on our handout of rotator cuff strengthening exercises. Corticosteroid injection today. Her back in 4 weeks

## 2010-12-29 ENCOUNTER — Emergency Department (HOSPITAL_COMMUNITY)
Admission: EM | Admit: 2010-12-29 | Discharge: 2010-12-30 | Disposition: A | Discharge status: Home / Self Care | Payer: Self-pay | Attending: Emergency Medicine | Admitting: Emergency Medicine

## 2010-12-29 DIAGNOSIS — T24219A Burn of second degree of unspecified thigh, initial encounter: Secondary | ICD-10-CM | POA: Insufficient documentation

## 2010-12-29 DIAGNOSIS — X12XXXA Contact with other hot fluids, initial encounter: Secondary | ICD-10-CM | POA: Insufficient documentation

## 2010-12-29 DIAGNOSIS — Z79899 Other long term (current) drug therapy: Secondary | ICD-10-CM | POA: Insufficient documentation

## 2010-12-29 DIAGNOSIS — F329 Major depressive disorder, single episode, unspecified: Secondary | ICD-10-CM | POA: Insufficient documentation

## 2010-12-29 DIAGNOSIS — S0003XA Contusion of scalp, initial encounter: Secondary | ICD-10-CM | POA: Insufficient documentation

## 2010-12-29 DIAGNOSIS — F3289 Other specified depressive episodes: Secondary | ICD-10-CM | POA: Insufficient documentation

## 2010-12-29 DIAGNOSIS — T2122XA Burn of second degree of abdominal wall, initial encounter: Secondary | ICD-10-CM | POA: Insufficient documentation

## 2010-12-29 DIAGNOSIS — W010XXA Fall on same level from slipping, tripping and stumbling without subsequent striking against object, initial encounter: Secondary | ICD-10-CM | POA: Insufficient documentation

## 2010-12-29 DIAGNOSIS — K219 Gastro-esophageal reflux disease without esophagitis: Secondary | ICD-10-CM | POA: Insufficient documentation

## 2010-12-29 DIAGNOSIS — Y92009 Unspecified place in unspecified non-institutional (private) residence as the place of occurrence of the external cause: Secondary | ICD-10-CM | POA: Insufficient documentation

## 2010-12-29 DIAGNOSIS — S0990XA Unspecified injury of head, initial encounter: Secondary | ICD-10-CM | POA: Insufficient documentation

## 2010-12-29 DIAGNOSIS — Y998 Other external cause status: Secondary | ICD-10-CM | POA: Insufficient documentation

## 2010-12-29 DIAGNOSIS — K589 Irritable bowel syndrome without diarrhea: Secondary | ICD-10-CM | POA: Insufficient documentation

## 2010-12-29 DIAGNOSIS — J45909 Unspecified asthma, uncomplicated: Secondary | ICD-10-CM | POA: Insufficient documentation

## 2010-12-29 DIAGNOSIS — G43909 Migraine, unspecified, not intractable, without status migrainosus: Secondary | ICD-10-CM | POA: Insufficient documentation

## 2010-12-30 ENCOUNTER — Emergency Department (HOSPITAL_COMMUNITY): Payer: Self-pay

## 2011-01-05 ENCOUNTER — Encounter: Payer: Self-pay | Admitting: Internal Medicine

## 2011-01-05 ENCOUNTER — Ambulatory Visit (INDEPENDENT_AMBULATORY_CARE_PROVIDER_SITE_OTHER): Payer: Self-pay | Admitting: Internal Medicine

## 2011-01-05 VITALS — BP 124/82 | HR 79 | Temp 98.4°F | Wt 144.3 lb

## 2011-01-05 DIAGNOSIS — R110 Nausea: Secondary | ICD-10-CM

## 2011-01-05 DIAGNOSIS — E781 Pure hyperglyceridemia: Secondary | ICD-10-CM

## 2011-01-05 DIAGNOSIS — F329 Major depressive disorder, single episode, unspecified: Secondary | ICD-10-CM

## 2011-01-05 DIAGNOSIS — K279 Peptic ulcer, site unspecified, unspecified as acute or chronic, without hemorrhage or perforation: Secondary | ICD-10-CM

## 2011-01-05 DIAGNOSIS — K589 Irritable bowel syndrome without diarrhea: Secondary | ICD-10-CM

## 2011-01-05 MED ORDER — DICYCLOMINE HCL 20 MG PO TABS: 20.0000 mg | ORAL_TABLET | Freq: Four times a day (QID) | ORAL | Status: DC

## 2011-01-05 MED ORDER — RANITIDINE HCL 150 MG PO TABS: 150.0000 mg | ORAL_TABLET | Freq: Two times a day (BID) | ORAL | Status: DC

## 2011-01-05 MED ORDER — FENOFIBRATE 145 MG PO TABS: 145.0000 mg | ORAL_TABLET | Freq: Every day | ORAL | Status: DC

## 2011-01-05 MED ORDER — QUETIAPINE FUMARATE 50 MG PO TABS: 50.0000 mg | ORAL_TABLET | Freq: Every day | ORAL | Status: DC

## 2011-01-06 ENCOUNTER — Encounter: Payer: Self-pay | Admitting: Internal Medicine

## 2011-01-06 MED ORDER — PROMETHAZINE HCL 12.5 MG PO TABS: 12.5000 mg | ORAL_TABLET | Freq: Four times a day (QID) | ORAL | Status: DC | PRN

## 2011-01-06 NOTE — Assessment & Plan Note (Signed)
>>  ASSESSMENT AND PLAN FOR HYPERTRIGLYCERIDEMIA WRITTEN ON 01/06/2011  2:10 PM BY KARIMOVA, Lynda Rainwater, MD  Called [facility] department. Patient needs to apply for a MAP program. The process of application and a phone number with a Spanish-speaking line was provided to the patient. Diet, low in saturated fats and high in fiber was reviewed with the patient. Risks of hyperTG explained ->patient verbalized understanding.

## 2011-01-06 NOTE — Assessment & Plan Note (Signed)
Patient has a long-standing history of depression. Denies SI/HI or mania at present. Compliant with her medication regimen. However, I am concerned with polypharmacy matter. After a discussion with Dr. Phillips Odor (Attending), we discontinued Elavil, cyclobenzapril, ambien. We will start Seroquel 50 mg PO qhs for 1 week and then increase to 100 mg PO qhs. Side-effects reviewed with the patient. Patient was instructed to call ASAP or go to ED if feeling worse. Patient verbalized understanding.

## 2011-01-06 NOTE — Progress Notes (Signed)
  Subjective:    Patient ID: Karen [name], female    DOB: [birth year], 45 y.o.   MRN: [id number]  HPI 1. F/u on hypertriglyceridemia. Patient was unable to fill in her Rx ofr a Tricor "because orange card does not cover it." Patient's diet high in cheeses, sourcream and other dairy products; no exercise. 2. Insomnia --requests a RF on Ambien, Elavil, zoloft. Patient does see a psychiatrist for a depression with last visit being 4 weeks ago. Denies any SI/HI or mania.   Review of Systems  Constitutional: Negative.   Eyes: Negative for visual disturbance.  Cardiovascular: Negative.   Gastrointestinal: Negative.   Musculoskeletal: Negative.   Neurological: Negative.   Psychiatric/Behavioral: Positive for dysphoric mood and decreased concentration. Negative for hallucinations, behavioral problems, confusion and agitation.       Objective:   Physical Exam     General: Vital signs reviewed and noted. Well-developed, well-nourished, in no acute distress; alert, appropriate and cooperative throughout examination.  Head: Normocephalic, atraumatic.  Neck: No deformities, masses, or tenderness noted.  Lungs:  Normal respiratory effort. Clear to auscultation BL without crackles or wheezes.  Heart: RRR. S1 and S2 normal without gallop, murmur, or rubs.  Abdomen:  BS normoactive. Soft, Nondistended, non-tender.  No masses or organomegaly.  Extremities: No pretibial edema.       Assessment & Plan:

## 2011-01-06 NOTE — Assessment & Plan Note (Signed)
Called [facility] department. Patient needs to apply for a MAP program. The process of application and a phone number with a Spanish-speaking line was provided to the patient. Diet, low in saturated fats and high in fiber was reviewed with the patient. Risks of hyperTG explained ->patient verbalized understanding.

## 2011-01-15 ENCOUNTER — Ambulatory Visit: Payer: Self-pay | Admitting: Family Medicine

## 2011-01-22 ENCOUNTER — Ambulatory Visit: Payer: Self-pay | Admitting: Family Medicine

## 2011-02-08 ENCOUNTER — Encounter: Payer: Self-pay | Admitting: Family Medicine

## 2011-02-08 ENCOUNTER — Ambulatory Visit (INDEPENDENT_AMBULATORY_CARE_PROVIDER_SITE_OTHER): Payer: Self-pay | Admitting: Family Medicine

## 2011-02-08 VITALS — BP 124/82 | HR 84 | Temp 98.4°F | Ht 62.0 in | Wt 130.0 lb

## 2011-02-08 DIAGNOSIS — M67919 Unspecified disorder of synovium and tendon, unspecified shoulder: Secondary | ICD-10-CM

## 2011-02-08 DIAGNOSIS — M719 Bursopathy, unspecified: Secondary | ICD-10-CM

## 2011-02-08 NOTE — Assessment & Plan Note (Signed)
Modest improvement with previous injection and home exercises. Will continue daily rotator cuff strengthening exercises as previously described. Today the patient received a second subacromial steroid injection 1:4cc kenalog/lidocaine mixture. Consent was obtained and time-out performed before procedure, semi-sterile technique used. Patient will follow up in 4-6 weeks.

## 2011-02-08 NOTE — Progress Notes (Signed)
  Subjective:    Patient ID: Karen [name], female    DOB: 10/29/1965, 45 y.o.   MRN: [id number]  HPI  Entire visit with a interpreter. Followup left shoulder pain. She is 50% better. She's been doing the exercises regularly. No new symptoms. Pain is still worse with external rotation and never had reaching. Pain centered in the shoulder, does not radiate down arm.  Review of Systems Denies new injury. Denies fever. Denies numbness or tingling in her hands.    Objective:   Physical Exam  Shoulder without obvious defect.  Negative sulcus sign. ROM: Full range of motion. Active range of motion has intact strength. She has some mild pain at the extremes of external rotation and supraspinatus testing. Her strength is much improved. She also has some pain with lift off test. Distally she is neurovascularly intact. INJECTION: Patient was given informed consent, signed copy in the chart. Appropriate time out was taken. Area prepped and draped in usual sterile fashion. One cc of kenalog plus  full cc of lidocaine was injected into the left subacromial bursa using a(n) posterior approach. The patient tolerated the procedure well. There were no complications. Post procedure instructions were given.           Assessment & Plan:  Rotator cuff syndrome much improved. We discussed options including one additional injection which she opted for today recommend she really work hard with the exercises daily for another 4-6 weeks and if she's continuing to improve she can taper off to 3 times a week. She can followup when necessary.

## 2011-02-08 NOTE — Progress Notes (Signed)
  Subjective:    Patient ID: Karen [name], female    DOB: Feb 25, 1966, 45 y.o.   MRN: [id number]  HPI  1. Left shoulder rotator cuff tendonopathy/bursitis. Pain has improved since initial visit 6 weeks ago when she received subacromial injection and home exercises. Thinks pain is roughly 50% improved. Has been doing daily exercises but still notices the pain when grabbing or reaching out. Not currently working. Denies dropping objects, numbness, tingling, new trauma.   Review of Systems See HPI otherwise negative.     Objective:   Physical Exam  Vitals reviewed. Constitutional: She is oriented to person, place, and time. She appears well-developed and well-nourished. No distress.  Neck: Normal range of motion.  Pulmonary/Chest: Effort normal.  Musculoskeletal: She exhibits tenderness. She exhibits no edema.       Painful arc of left shoulder abduction ~100-150 degrees. Pain with empty-can test, Neer's maneuver, Lift-off. 4+/5 strength with left external rotation and internal rotation.   Mildly TTP over bicipital groove.   Neurological: She is alert and oriented to person, place, and time. No cranial nerve deficit. She exhibits normal muscle tone. Coordination normal.          Assessment & Plan:

## 2011-02-09 ENCOUNTER — Ambulatory Visit (HOSPITAL_COMMUNITY)
Admission: RE | Admit: 2011-02-09 | Discharge: 2011-02-09 | Disposition: A | Discharge status: Home / Self Care | Payer: Self-pay | Source: Ambulatory Visit | Attending: Internal Medicine | Admitting: Internal Medicine

## 2011-02-09 ENCOUNTER — Encounter: Payer: Self-pay | Admitting: Internal Medicine

## 2011-02-09 ENCOUNTER — Ambulatory Visit: Payer: Self-pay | Admitting: Internal Medicine

## 2011-02-09 ENCOUNTER — Ambulatory Visit (INDEPENDENT_AMBULATORY_CARE_PROVIDER_SITE_OTHER): Payer: Self-pay | Admitting: Internal Medicine

## 2011-02-09 ENCOUNTER — Other Ambulatory Visit: Payer: Self-pay

## 2011-02-09 VITALS — BP 131/84 | HR 62 | Temp 96.6°F | Ht 62.0 in | Wt 143.4 lb

## 2011-02-09 DIAGNOSIS — K5900 Constipation, unspecified: Secondary | ICD-10-CM

## 2011-02-09 DIAGNOSIS — F3289 Other specified depressive episodes: Secondary | ICD-10-CM

## 2011-02-09 DIAGNOSIS — R112 Nausea with vomiting, unspecified: Secondary | ICD-10-CM

## 2011-02-09 DIAGNOSIS — R0789 Other chest pain: Secondary | ICD-10-CM

## 2011-02-09 DIAGNOSIS — R110 Nausea: Secondary | ICD-10-CM

## 2011-02-09 DIAGNOSIS — E781 Pure hyperglyceridemia: Secondary | ICD-10-CM

## 2011-02-09 DIAGNOSIS — F329 Major depressive disorder, single episode, unspecified: Secondary | ICD-10-CM

## 2011-02-09 DIAGNOSIS — R079 Chest pain, unspecified: Secondary | ICD-10-CM | POA: Insufficient documentation

## 2011-02-09 DIAGNOSIS — K589 Irritable bowel syndrome without diarrhea: Secondary | ICD-10-CM

## 2011-02-09 MED ORDER — HYOSCYAMINE SULFATE 0.125 MG PO TABS: 0.1250 mg | ORAL_TABLET | Freq: Four times a day (QID) | ORAL | Status: AC | PRN

## 2011-02-09 MED ORDER — TRAMADOL HCL 50 MG PO TABS: 50.0000 mg | ORAL_TABLET | Freq: Three times a day (TID) | ORAL | Status: DC | PRN

## 2011-02-09 MED ORDER — DOCUSATE SODIUM 100 MG PO CAPS: 100.0000 mg | ORAL_CAPSULE | Freq: Two times a day (BID) | ORAL | Status: DC

## 2011-02-09 MED ORDER — PROMETHAZINE HCL 12.5 MG PO TABS: 12.5000 mg | ORAL_TABLET | Freq: Four times a day (QID) | ORAL | Status: DC | PRN

## 2011-02-09 NOTE — Assessment & Plan Note (Addendum)
She can get Tricor through MAP but it takes 6 weeks for medications to be approved. I called the pharmacy and they told me that the patient can probably get few samples to get stared. Her prescription was called  on 01/05/2011- so it's not 6 weeks yet. She was requesting for her FLP but has not taken her medication yet and was not even fasting . Will check her FLP with the next visit.

## 2011-02-09 NOTE — Assessment & Plan Note (Signed)
She was complaining of left-sided chest pain.  Differentials include musculoskeletal versus cardiac versus GERD. Her EKG was reviewed -which shows normal sinus rhythm with nonspecific T wave changes. I think she has fibromyalgia and her pain could be related to that. Will give her tramadol for symptomatic treatment.

## 2011-02-09 NOTE — Assessment & Plan Note (Signed)
She complains of left lower quadrant abdominal pain today which is likely related to her irritable bowel syndrome. I asked her if she was taking dicyclomine but didn't get a clear answer from her.  -Will give her a trial of hyoscyamine for crampy abdominal pain. -Will also give her a prescription for Colace for her constipation.

## 2011-02-09 NOTE — Assessment & Plan Note (Signed)
>>  ASSESSMENT AND PLAN FOR HYPERTRIGLYCERIDEMIA WRITTEN ON 02/09/2011  5:28 PM BY SAWHNEY, MEGHA, MD  She can get Tricor through MAP but it takes 6 weeks for medications to be approved. I called the pharmacy and they told me that the patient can probably get few samples to get stared. Her prescription was called  on 01/05/2011- so it's not 6 weeks yet. She was requesting for her FLP but has not taken her medication yet and was not even fasting . Will check her FLP with the next visit.

## 2011-02-09 NOTE — Progress Notes (Signed)
  Subjective:    Patient ID: Karen [name], female    DOB: 11/17/1965, 45 y.o.   MRN: [id number]  HPI:45 y/o woman with PMH significant for a irritable bowel syndrome, depression comes to the clinic for followup visit.   Patient is spanish speaking and we had a Nurse, learning disability for the conversation.  She states having abdominal pain for almost a year now. Her abdominal pain is located in the left lower quadrant today but the pain has been diffuse at other times. She describes the pain as dull achy kind of constant discomfort. She has been complaining of increased constipation for last month -has been using  Dulcolax suppository after which she ends up getting diarrhea. Even before she was taking Dulcolax she used to have constipation and diarrhea alternating. She also reports nausea and has vomited yesterday. Could not get the description for her vomitus as she immediately flushed it. She also complains of pain on the left side of the chest pain, rates  8/10 , described her pain as sharp constant pain which has been present for last 2 weeks. This has been associated with some mild shortness of breath. Nothing makes it better or  worse.  She also complains of pain in the right eye, feels like a stone sticking into her eye and is requesting for examination. Denies any vision problems.   .    Review of Systems  Constitutional: Negative for fever, chills, appetite change and fatigue.  HENT: Negative for congestion, rhinorrhea, sneezing and postnasal drip.   Eyes: Negative for photophobia and visual disturbance.  Respiratory: Negative for apnea, cough, choking, chest tightness, shortness of breath, wheezing and stridor.   Cardiovascular: Positive for chest pain. Negative for palpitations and leg swelling.  Gastrointestinal: Positive for nausea, abdominal pain and constipation.  Genitourinary: Negative for frequency and hematuria.  Musculoskeletal: Negative for arthralgias.  Neurological:  Negative for dizziness, light-headedness and headaches.  Hematological: Negative for adenopathy.       Objective:   Physical Exam  Constitutional: She is oriented to person, place, and time. She appears well-developed and well-nourished. No distress.  HENT:  Head: Normocephalic and atraumatic.  Eyes: Conjunctivae are normal. Pupils are equal, round, and reactive to light. Right eye exhibits no discharge. Left eye exhibits no discharge. No scleral icterus.  Neck: Normal range of motion. Neck supple. No JVD present. No tracheal deviation present. No thyromegaly present.  Cardiovascular: Normal rate, regular rhythm, normal heart sounds and intact distal pulses.  Exam reveals no gallop and no friction rub.   No murmur heard. Pulmonary/Chest: Effort normal and breath sounds normal. No stridor. No respiratory distress. She has no wheezes. She has no rales. She exhibits no tenderness.  Abdominal: Soft. Bowel sounds are normal. She exhibits no distension and no mass. There is no rebound and no guarding.       TTP in LLQ.  Musculoskeletal: Normal range of motion. She exhibits no edema and no tenderness.  Lymphadenopathy:    She has no cervical adenopathy.  Neurological: She is alert and oriented to person, place, and time. She has normal reflexes. She displays normal reflexes. No cranial nerve deficit. She exhibits normal muscle tone. Coordination normal.  Skin: Skin is warm. She is not diaphoretic.          Assessment & Plan:

## 2011-02-09 NOTE — Assessment & Plan Note (Signed)
She complained of some nausea for about a month. This could be related to her constipation. Will treat her symptomatically with Phenergan.

## 2011-02-09 NOTE — Patient Instructions (Signed)
Please take your medicines as prescribed. Please schedule a follow up appointment with your PCP in 1-2 months.

## 2011-02-10 ENCOUNTER — Other Ambulatory Visit: Payer: Self-pay | Admitting: *Deleted

## 2011-02-10 DIAGNOSIS — F329 Major depressive disorder, single episode, unspecified: Secondary | ICD-10-CM

## 2011-02-10 DIAGNOSIS — E781 Pure hyperglyceridemia: Secondary | ICD-10-CM

## 2011-02-10 DIAGNOSIS — J45909 Unspecified asthma, uncomplicated: Secondary | ICD-10-CM

## 2011-02-10 MED ORDER — QUETIAPINE FUMARATE 25 MG PO TABS: 25.0000 mg | ORAL_TABLET | Freq: Every day | ORAL | Status: DC

## 2011-02-10 MED ORDER — FENOFIBRATE 145 MG PO TABS
145.0000 mg | ORAL_TABLET | Freq: Every day | ORAL | Status: DC
Start: 2011-02-10 — End: 2011-09-09

## 2011-02-10 MED ORDER — QUETIAPINE FUMARATE 50 MG PO TABS: 50.0000 mg | ORAL_TABLET | Freq: Every day | ORAL | Status: DC

## 2011-02-10 MED ORDER — ALBUTEROL SULFATE HFA 108 (90 BASE) MCG/ACT IN AERS: 2.0000 | INHALATION_SPRAY | Freq: Four times a day (QID) | RESPIRATORY_TRACT | Status: DC | PRN

## 2011-02-10 NOTE — Telephone Encounter (Signed)
Previous prescription was destroyed by pharmacy since pt was not enrolled per Aggie Cosier.  Pt has since completed information and needs new written scripts for her medication refills.

## 2011-02-10 NOTE — Assessment & Plan Note (Addendum)
Denies any depressed mood, suicidal ideations or homicidal ideations. She reports some sleeping difficulty.She has not started taking Seroquel yet( double checked with [facility] pharmacy). Will continue her on Zoloft and reduce the dose of seroquel to 25 mg daily. We may consider increasing the dose of her seroquel with future visits.

## 2011-02-10 NOTE — Progress Notes (Signed)
Addended by: Elyse Jarvis on: 02/10/2011 02:39 PM   Modules accepted: Orders

## 2011-02-11 NOTE — Telephone Encounter (Signed)
Faxed to the [facility]. 

## 2011-03-25 ENCOUNTER — Encounter: Payer: Self-pay | Admitting: Internal Medicine

## 2011-03-25 ENCOUNTER — Ambulatory Visit (INDEPENDENT_AMBULATORY_CARE_PROVIDER_SITE_OTHER): Payer: Self-pay | Admitting: Internal Medicine

## 2011-03-25 DIAGNOSIS — R110 Nausea: Secondary | ICD-10-CM

## 2011-03-25 DIAGNOSIS — E785 Hyperlipidemia, unspecified: Secondary | ICD-10-CM

## 2011-03-25 DIAGNOSIS — M79609 Pain in unspecified limb: Secondary | ICD-10-CM

## 2011-03-25 DIAGNOSIS — Z0000 Encounter for general adult medical examination without abnormal findings: Secondary | ICD-10-CM

## 2011-03-25 DIAGNOSIS — M79602 Pain in left arm: Secondary | ICD-10-CM

## 2011-03-25 LAB — LIPID PANEL: Triglycerides: 583 mg/dL — ABNORMAL HIGH (ref <150)

## 2011-03-25 LAB — HEPATIC FUNCTION PANEL
Alkaline Phosphatase: 59 U/L (ref 39–117)
Bilirubin, Direct: 0.1 mg/dL (ref 0.0–0.3)
Indirect Bilirubin: 0.2 mg/dL (ref 0.0–0.9)
Total Bilirubin: 0.3 mg/dL (ref 0.3–1.2)

## 2011-03-25 MED ORDER — PROMETHAZINE HCL 12.5 MG PO TABS: 12.5000 mg | ORAL_TABLET | Freq: Four times a day (QID) | ORAL | Status: DC | PRN

## 2011-03-25 MED ORDER — TRAMADOL HCL 50 MG PO TABS: 50.0000 mg | ORAL_TABLET | Freq: Four times a day (QID) | ORAL | Status: DC | PRN

## 2011-03-25 NOTE — Progress Notes (Signed)
  Subjective:    Patient ID: Karen [name], female    DOB: 1966-06-02, 45 y.o.   MRN: [id number]  HPI 1. Left shoulder and arm pain with tingling and numbness, pain is 9/10 in intensity, waxes and wanes; denies any trauma.   Review of Systems Panpositive    Objective:   Physical Exam  Constitutional: She is oriented to person, place, and time. She appears well-developed and well-nourished. No distress.  HENT:  Head: Atraumatic.  Right Ear: External ear normal.  Left Ear: External ear normal.  Eyes: Conjunctivae and EOM are normal. Pupils are equal, round, and reactive to light. Right eye exhibits no discharge. Left eye exhibits no discharge. No scleral icterus.  Neck: Normal range of motion. Neck supple. No JVD present. No thyromegaly present.  Cardiovascular: Normal rate, regular rhythm, normal heart sounds and intact distal pulses.   Pulmonary/Chest: Effort normal and breath sounds normal. She exhibits tenderness.  Abdominal: Soft. Bowel sounds are normal. She exhibits no distension and no mass. There is tenderness. There is no rebound and no guarding.  Musculoskeletal: Normal range of motion. She exhibits no edema and no tenderness.  Lymphadenopathy:    She has no cervical adenopathy.  Neurological: She is alert and oriented to person, place, and time. She has normal reflexes. She displays normal reflexes. No cranial nerve deficit. She exhibits normal muscle tone. Coordination normal.  Skin: Skin is warm and dry. No rash noted. She is not diaphoretic. No erythema.  Psychiatric: Judgment normal.          Assessment & Plan:  1. Hypertriglyceredemia. Patient takes Tricor --no side-effects reported. -will check LFT's -continue with Tricor  2. Left shoulder pain, chronic; no hsitory of trauma. Takes Tramdol that helps "only sometimes." If it does not, patient takes Vicodin. Patient is very ambiguous on who prescribes her Vicodin and tramadol. -Requested Dr. Lamar Blinks  assistance to check controlled substance refill history database. -will Rx Tramadol today -Phenergan for chronic nausea  3. Multiple psychosomatic symptoms. Patient has a history of depression and currently off Seroqeul due to inability to pay for it. Denies any SI/HI or mania.  -continue with Zoloft -avoid Rx controlled substances as the patient exhitis drug-seeking behavior.

## 2011-03-25 NOTE — Patient Instructions (Signed)
Please, do not drive if you feel sleepy while taking tramadol. Please, follow up with a Nerve conduction study for your arm/shoulder pain. You received a flu shot today. Please, follow up in 8-12 weeks and call with any questions.

## 2011-04-06 LAB — I-STAT 8, (EC8 V) (CONVERTED LAB)
BUN: 11
Chloride: 104
Glucose, Bld: 87
Hemoglobin: 14.3
Potassium: 4.3
pH, Ven: 7.418 — ABNORMAL HIGH

## 2011-04-06 LAB — POCT CARDIAC MARKERS
Myoglobin, poc: 33.7
Operator id: 282201

## 2011-04-06 LAB — POCT I-STAT CREATININE: Operator id: 282201

## 2011-04-23 ENCOUNTER — Encounter: Payer: Self-pay | Admitting: Internal Medicine

## 2011-04-23 ENCOUNTER — Ambulatory Visit (INDEPENDENT_AMBULATORY_CARE_PROVIDER_SITE_OTHER): Payer: Self-pay | Admitting: Internal Medicine

## 2011-04-23 ENCOUNTER — Encounter: Payer: Self-pay | Admitting: *Deleted

## 2011-04-23 DIAGNOSIS — R109 Unspecified abdominal pain: Secondary | ICD-10-CM | POA: Insufficient documentation

## 2011-04-23 DIAGNOSIS — M79609 Pain in unspecified limb: Secondary | ICD-10-CM

## 2011-04-23 DIAGNOSIS — R110 Nausea: Secondary | ICD-10-CM

## 2011-04-23 DIAGNOSIS — M79602 Pain in left arm: Secondary | ICD-10-CM

## 2011-04-23 DIAGNOSIS — F329 Major depressive disorder, single episode, unspecified: Secondary | ICD-10-CM

## 2011-04-23 LAB — POCT URINALYSIS DIPSTICK
Bilirubin, UA: NEGATIVE
Glucose, UA: NEGATIVE
Ketones, UA: NEGATIVE
Leukocytes, UA: NEGATIVE
Nitrite, UA: NEGATIVE
pH, UA: 5.5

## 2011-04-23 LAB — URINALYSIS, ROUTINE W REFLEX MICROSCOPIC
Bilirubin Urine: NEGATIVE
Glucose, UA: NEGATIVE mg/dL
Hgb urine dipstick: NEGATIVE
Ketones, ur: NEGATIVE mg/dL
Leukocytes, UA: NEGATIVE
Protein, ur: NEGATIVE mg/dL
pH: 5.5 (ref 5.0–8.0)

## 2011-04-23 MED ORDER — ZOLPIDEM TARTRATE 10 MG PO TABS: 10.0000 mg | ORAL_TABLET | ORAL | Status: DC

## 2011-04-23 MED ORDER — PROMETHAZINE HCL 12.5 MG PO TABS: 12.5000 mg | ORAL_TABLET | Freq: Four times a day (QID) | ORAL | Status: DC | PRN

## 2011-04-23 MED ORDER — TRAMADOL HCL 50 MG PO TABS: 50.0000 mg | ORAL_TABLET | Freq: Four times a day (QID) | ORAL | Status: DC | PRN

## 2011-04-23 MED ORDER — QUETIAPINE FUMARATE 50 MG PO TABS: 50.0000 mg | ORAL_TABLET | Freq: Every day | ORAL | Status: DC

## 2011-04-23 MED ORDER — SERTRALINE HCL 100 MG PO TABS: 100.0000 mg | ORAL_TABLET | Freq: Two times a day (BID) | ORAL | Status: DC

## 2011-04-23 NOTE — Progress Notes (Signed)
Agree with appt. 

## 2011-04-23 NOTE — Assessment & Plan Note (Addendum)
Exact etiology for abdominal pain is unknown at this time. Patient has had this complaint of abdominal pain associated with nausea in the past and has had it worked up with a CT scan that was essentially negative. A urinalysis was checked with his dipstick and was negative for nitrates or leukocytes however there was some trace blood noted. Looking back to 2010 she also has a urinalysis which shows some trace blood. Patient has had a hysterectomy and no longer menstruating. Because of this one day history of vaginal bleeding there is some concern for this problem being related to her ovarian cysts. Pelvic exam done and was within normal limits. Will refer to OB/GYN for further evaluation of this problem. We will also order a transvaginal ultrasound for further workup. We will send the urine off for microscopy. If the patient has red blood cells in her urine there is some concern for bladder cancer however the patient is not having respecter such as smoking to suggest bladder cancer. She may need further urology workup such as cystoscopy if she does have purple spots in her hearing. There is concern for somatization as the patient has significant depression history. However this would be a diagnosis of exclusion.

## 2011-04-23 NOTE — Assessment & Plan Note (Signed)
Will refill Zoloft and Seroquel as patient recently went to behavioral health and was told that she can no longer be followed by them. Patient was tearful today however denies suicidal ideations.

## 2011-04-23 NOTE — Progress Notes (Signed)
  Subjective:    Patient ID: Karen [name], female    DOB: Feb 05, 1966, 45 y.o.   MRN: [id number]  HPI This patient is a 45 year old woman with pmh significant for Depression, HTN, HLD, Migraines, who presents for the following:  1.) Abdominal pain - radiates to back, which started 4-5 days ago. Pain is described as cramping, not related to food intake. Associated with decreased appetite, and significant nausea but no vomiting. Pain does not travel to any other location. Also associated with constipation, last BM was yesterday morning, it was hard. Denies dysuria, urgency, frequency, or vaginal discharge. Patient has hx of peptic ulcer, however she states her current pain is not like that.  LMP was 8-9 years ago  Patient also noted vaginal bleeding the resembled her period, but it was only for one day. Patient wore pads for that one day.    Review of Systems  Constitutional: Negative for fever and chills.  Gastrointestinal: Positive for nausea, abdominal pain, constipation and abdominal distention. Negative for vomiting, diarrhea, blood in stool and anal bleeding.  Genitourinary: Positive for vaginal bleeding. Negative for vaginal discharge, vaginal pain and pelvic pain.       Objective:   Physical Exam  Constitutional: She appears well-developed.  HENT:  Head: Normocephalic.  Neck: Normal range of motion. Neck supple.  Cardiovascular: Normal rate and regular rhythm.   Pulmonary/Chest: Effort normal and breath sounds normal.  Abdominal: Soft. Bowel sounds are normal. She exhibits no distension and no mass. There is no rebound and no guarding.       Tender to palpation bilateral lower quadrants Negative Rovsing's sign Negative psoas sign Negative Murphy's sign   Genitourinary: Vagina normal and uterus normal. No vaginal discharge found.       Suprapubic tenderness  Musculoskeletal: Normal range of motion.  Psychiatric:       Tearful during history          Assessment  & Plan:

## 2011-04-23 NOTE — Patient Instructions (Signed)
Please follow up with OB-GYN as scheduled.  Please follow up with transvaginal ultra-sound as scheduled. Please take all medications as prescribed.  Please follow up in 1 month or sooner if needed.

## 2011-04-23 NOTE — Progress Notes (Unsigned)
Pt presents at front asking to be seen, states for 2 days she has lower L side and lower mid back pain, pain scale 8/10, + for nausea, no vomiting but states she has not tried to eat, has had 1 glass milk today and that stayed down. appt given w/ dr sidhu at 1545 per sharonb.

## 2011-04-23 NOTE — Progress Notes (Signed)
interprete Karen [name]

## 2011-04-26 ENCOUNTER — Telehealth: Payer: Self-pay | Admitting: *Deleted

## 2011-04-26 NOTE — Telephone Encounter (Deleted)
Prior authorization is required for Oxycontin 10mg  per pt's insurance co.   Form completed by Dr Baltazar Apo and faxed to [phone]  For a response.

## 2011-04-27 ENCOUNTER — Encounter: Payer: Self-pay | Admitting: *Deleted

## 2011-04-27 NOTE — Progress Notes (Signed)
Addended by: Youlanda Roys A on: 04/27/2011 03:29 PM   Modules accepted: Orders

## 2011-04-27 NOTE — Progress Notes (Signed)
  Subjective:    Patient ID: Karen [name], female    DOB: 1965-07-30, 45 y.o.   MRN: [id number]  HPI    Review of Systems     Objective:   Physical Exam        Assessment & Plan:

## 2011-04-27 NOTE — Progress Notes (Signed)
This is a response to patient presenting at front desk demanding a tranquillizer.  This patient, who I do not know, is apparantly on at least 2 mental illness meds and states that she has "left mental health care".  This clinic is not qualified to add to already complex psychiatric drug regimens.  I asked Myriam Jacobson to learn who (if anyone) had been prescribing clonazepam and to call that provider to learn the story.  We may have to send this patient to the ER to see the ACT team.

## 2011-04-27 NOTE — Progress Notes (Signed)
Pt could not tell me the name of the md at [facility] or show me the bottle from the pharmacy, she stated she threw it away but still had 5 pills, later in the conversation she stated she has 9 pills. She could not tell me where it was filled. i called [facility] and she was indeed seen there 10/11, not by a md but a clinician she was not given medicine per the person in med records i spoke with, i have sent a release of information for all records for pt from [facility]. i also sent a release to [facility] and was told it would take possibly until Monday to get the records due to being in storage. Pt through the translator was ask if she felt like she would harm herself she stated no but she was irritated at the nurse because she would not give her the clonazepam, i explained again that the attending physician denied the request and she was given an appt so records may be obtained and determination of medicine doses, and other information concerning medicine be documented. She was again informed that if she felt the desire to harm self, others or her panic attacks was not being controlled she was welcome to be seen in the ED. appt was scheduled for next week

## 2011-04-27 NOTE — Progress Notes (Signed)
Pt presents at front desk, she is given the translator, graciela via phone, she tells her that she has been discharged from mental health because her md went into private practice and she does not have a Health visitor, she states she will need a script for clonazepam and wants to wait on it, a gentleman is with her and speaks a little english stating she has to have script now because if she does not take it she will have panic attacks and may commit suicide. i had informed her the request would be sent to the md and it would be called in within 48 hrs, but they want the script now

## 2011-04-28 NOTE — Telephone Encounter (Signed)
ERROR wrong pt

## 2011-04-29 ENCOUNTER — Other Ambulatory Visit (HOSPITAL_COMMUNITY): Payer: Self-pay

## 2011-04-29 ENCOUNTER — Ambulatory Visit (HOSPITAL_COMMUNITY)
Admission: RE | Admit: 2011-04-29 | Discharge: 2011-04-29 | Disposition: A | Discharge status: Home / Self Care | Payer: Self-pay | Source: Ambulatory Visit | Attending: Internal Medicine | Admitting: Internal Medicine

## 2011-04-29 DIAGNOSIS — N898 Other specified noninflammatory disorders of vagina: Secondary | ICD-10-CM | POA: Insufficient documentation

## 2011-04-29 DIAGNOSIS — N9489 Other specified conditions associated with female genital organs and menstrual cycle: Secondary | ICD-10-CM | POA: Insufficient documentation

## 2011-04-29 DIAGNOSIS — R109 Unspecified abdominal pain: Secondary | ICD-10-CM | POA: Insufficient documentation

## 2011-05-04 ENCOUNTER — Ambulatory Visit (INDEPENDENT_AMBULATORY_CARE_PROVIDER_SITE_OTHER): Payer: Self-pay | Admitting: Internal Medicine

## 2011-05-04 ENCOUNTER — Encounter: Payer: Self-pay | Admitting: Internal Medicine

## 2011-05-04 DIAGNOSIS — F411 Generalized anxiety disorder: Secondary | ICD-10-CM

## 2011-05-04 DIAGNOSIS — N7011 Chronic salpingitis: Secondary | ICD-10-CM

## 2011-05-04 DIAGNOSIS — N7013 Chronic salpingitis and oophoritis: Secondary | ICD-10-CM

## 2011-05-04 DIAGNOSIS — E781 Pure hyperglyceridemia: Secondary | ICD-10-CM

## 2011-05-04 MED ORDER — CLONAZEPAM 1 MG PO TABS: 1.0000 mg | ORAL_TABLET | Freq: Two times a day (BID) | ORAL | Status: DC | PRN

## 2011-05-04 NOTE — Progress Notes (Signed)
  Subjective:    Patient ID: Karen [name], female    DOB: 1965/09/19, 45 y.o.   MRN: [id number]  HPI  [name] is a 45 year old woman with past medical history significant for depression, anxiety, elevated triglycerides, IBS, chronic pelvic pain associated with nausea who presents today for followup.  1.) U/S F/U:  Patient was evaluated for chronic pelvic pain and nausea at last visit. Due to some trace blood seen in her urine dipstick she was referred for further workup that included a pelvic and vaginal ultrasound. The ultrasound was concerning for a right adnexal mass that may be suggestive of hydrosalpinx. Patient states that her symptoms have not changed much in terms of mild pelvic pain and feeling nauseous.  2.) Anxiety:  Patient was previously followed by outpatient psychiatry who do to unknown reasons have discharged the patient from their practice. This may be secondary to financial concerns. Patient needs a refill on her clonazepam.  3.) Elevated lipids - patient would like the results of her lipid panel today. She states that she is compliant with her TriCor.  No other complaints or concerns today. Review of Systems  All other systems reviewed and are negative.       Objective:   Physical Exam  Constitutional: She appears well-developed.  Cardiovascular: Normal rate and regular rhythm.   Abdominal: Soft. Bowel sounds are normal.          Assessment & Plan:

## 2011-05-04 NOTE — Assessment & Plan Note (Signed)
Patient has a chronic history of elevated triglycerides. In the past she has been started on TriCor however due to financial reasons has not been able to get this medication. As a result patient is not on therapy consistently. However as of 3 months ago patient states that she has been taking this medication regularly daily. Her lipid panel does show that her triglycerides have decreased from 700-500 however it they are still elevated. Will defer to patient's PCP regarding further management. I am not sure if this lipid panel was collected while patient was fasting as this might affect her triglycerides. Furthermore if she is truly taking this medication and this was a fasting lipid panel perhaps patient needs to be also started on a statin for additive measures.

## 2011-05-04 NOTE — Assessment & Plan Note (Signed)
Patient has a history of hysterectomy. She presented at last visit with chronic pelvic pain as well as nausea. She has had an extensive workup in the past including a CT which essentially did not show any abnormal findings. However pelvic pain as well as some blood seen on her urine dipstick warranted further evaluation which started with a transvaginal ultrasound. 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 Prior hysterectomy may have caused adhesions which may have damage the fallopian tube causing it to be blocked and enlarged. 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 Patient will be following up with [facility] later this month for further evaluation of this problem. We will defer management to [facility] for now and follow their recommendations.

## 2011-05-04 NOTE — Patient Instructions (Signed)
Please follow up as needed 

## 2011-05-04 NOTE — Progress Notes (Signed)
Due to language barrier, an interpreter was present during the history-taking and subsequent discussion (and for part of the physical exam) with this patient Interpreter Wyvonnia Dusky for Karen [name] 15.00 hrs.  05/04/2011

## 2011-05-04 NOTE — Assessment & Plan Note (Signed)
As mentioned in history of present illness patient has been discharged from outpatient psychiatry where she was followed in the past. We will refill her medications of Zoloft, Seroquel and clonazepam. Currently stable without any acute symptoms of anxiety or depression. Patient's records from outpatient psychiatry have been faxed over and reviewed. The writing was a little illegible and thus difficult to discern what exact management she was receiving.

## 2011-05-04 NOTE — Assessment & Plan Note (Signed)
>>  ASSESSMENT AND PLAN FOR HYPERTRIGLYCERIDEMIA WRITTEN ON 05/04/2011  3:46 PM BY Melida Quitter, MD  Patient has a chronic history of elevated triglycerides. In the past she has been started on TriCor however due to financial reasons has not been able to get this medication. As a result patient is not on therapy consistently. However as of 3 months ago patient states that she has been taking this medication regularly daily. Her lipid panel does show that her triglycerides have decreased from 700-500 however it they are still elevated. Will defer to patient's PCP regarding further management. I am not sure if this lipid panel was collected while patient was fasting as this might affect her triglycerides. Furthermore if she is truly taking this medication and this was a fasting lipid panel perhaps patient needs to be also started on a statin for additive measures.

## 2011-05-21 ENCOUNTER — Encounter: Payer: Self-pay | Admitting: Internal Medicine

## 2011-05-21 ENCOUNTER — Ambulatory Visit (INDEPENDENT_AMBULATORY_CARE_PROVIDER_SITE_OTHER): Payer: Self-pay | Admitting: Internal Medicine

## 2011-05-21 VITALS — BP 122/79 | HR 82 | Temp 97.4°F | Ht 62.0 in | Wt 145.2 lb

## 2011-05-21 DIAGNOSIS — F329 Major depressive disorder, single episode, unspecified: Secondary | ICD-10-CM

## 2011-05-21 DIAGNOSIS — L259 Unspecified contact dermatitis, unspecified cause: Secondary | ICD-10-CM

## 2011-05-21 DIAGNOSIS — G4700 Insomnia, unspecified: Secondary | ICD-10-CM

## 2011-05-21 DIAGNOSIS — L309 Dermatitis, unspecified: Secondary | ICD-10-CM

## 2011-05-21 MED ORDER — HYDROCORTISONE 0.5 % EX CREA: TOPICAL_CREAM | Freq: Two times a day (BID) | CUTANEOUS | Status: DC

## 2011-05-21 MED ORDER — LORATADINE 10 MG PO TABS: 10.0000 mg | ORAL_TABLET | Freq: Every day | ORAL | Status: DC

## 2011-05-21 MED ORDER — ZOLPIDEM TARTRATE 10 MG PO TABS: 10.0000 mg | ORAL_TABLET | ORAL | Status: DC

## 2011-05-21 NOTE — Progress Notes (Signed)
  Subjective:    Patient ID: Karen [name], female    DOB: 12/29/1965, 45 y.o.   MRN: [id number]  HPI    Review of Systems     Objective:   Physical Exam        Assessment & Plan:   

## 2011-05-21 NOTE — Patient Instructions (Signed)
Please, follow up with a new psychiatrist. Please, pick up your prescription for zolpidem. Please, follow up with a gynecologist in December as was instructed. Call with any questions.
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
## 2011-05-24 ENCOUNTER — Telehealth: Payer: Self-pay | Admitting: Licensed Clinical Social Worker

## 2011-05-24 NOTE — Telephone Encounter (Signed)
Extensive conversation w/ patient via interpreter.  Patient is uninsured and informs me that [facility] will not serve her due to lack of social security number.  She was also connected w/ [facility] at one time but did not have success in working w/ them and they charged her $20 for an assessment.  The patient tells me she is alone and gets some support for housing but otherwise needs a job, income and food assistance.  Lack of social security number and status are significant barriers to care and employment.    I have directed her to [facility] here in town which serves Spanish-speaking immigrants and they should be able to provide additional supports and direction.  Food resources and other resources will be sent to patient via mail.   The patient is already connected to [facility] and was not certain about [facility].   I will ask Dr. Denton Meek to continue to prescribe anti-depressant medication since patient is not going to be able to access psychiatry due to lack of insurance and lack of social security number.   I have a call into [facility] to see if they now have psychiatry and program to work w/ Spanish-speaking population.

## 2011-05-26 NOTE — Telephone Encounter (Signed)
[facility] has both counseling and part time psychiatric PA who will work with patient.  They have Spanish speaking intake from 8 to 12 and 1 to 3  Monday thru Friday on walk-in basis and will work with patient on sliding scale.

## 2011-05-31 ENCOUNTER — Telehealth: Payer: Self-pay | Admitting: Licensed Clinical Social Worker

## 2011-05-31 NOTE — Telephone Encounter (Signed)
[facility] called and said they are no longer operating in [place]  (in [place]) and services are limited to notary, translation, food pantry and baby shop.   Said extremely limited job opportunities for undocumented.  Possibly [employer].   Packet of information will be given to nurse at patient's next appmt.

## 2011-06-07 ENCOUNTER — Ambulatory Visit (INDEPENDENT_AMBULATORY_CARE_PROVIDER_SITE_OTHER): Payer: Self-pay | Admitting: Obstetrics & Gynecology

## 2011-06-07 ENCOUNTER — Encounter: Payer: Self-pay | Admitting: Obstetrics and Gynecology

## 2011-06-07 VITALS — BP 124/82 | HR 87 | Temp 98.6°F | Ht 60.0 in | Wt 143.9 lb

## 2011-06-07 DIAGNOSIS — R102 Pelvic and perineal pain: Secondary | ICD-10-CM

## 2011-06-07 DIAGNOSIS — N949 Unspecified condition associated with female genital organs and menstrual cycle: Secondary | ICD-10-CM

## 2011-06-07 NOTE — Patient Instructions (Signed)
Dolor plvico (Pelvic Pain) Es el dolor que se ubica debajo del ombligo, entre las caderas. El dolor agudo puede durar algunas horas o das. El dolor plvico crnico puede durar algunas semanas o meses. Las causas de los diferentes tipos de dolor pueden ser varias. El dolor puede ser sordo o agudo, leve o intenso y puede interferir con las actividades diarias. Antelo y mustrelo a su mdico.   Exactamente donde se ubica.   Si viene y se va, o permanece todo el tiempo.   Cuando aparece (al mantener relaciones sexuales, al orinar, al mover el intestino, etc.)   Si se relaciona con el perodo menstrual o situaciones de estrs.  El mdico le har una historia clnica completa, un examen fsico y un [place]. CAUSAS  Perodos menstruales dolorosos (dismenorrea).   Ovulacin normal, que ocurre en la mitad del perodo menstrual todos los meses.   Los rganos plvicos que se congestionan de sangre justo antes del perodo menstrual (sndrome congestivo plvico).   Tejido cicatrizal consecuencia de una infeccin o cirugas anteriores (adherencias plvicas).   Cncer en los rganos plvicos femeninos. Cuando el dolor es por cncer, ha aparecido hace mucho tiempo.   El tejido que tapiza internamente al tero (endometrio) crece de manera anormal en lugares como la pelvis y en los rganos plvicos (endometriosis),   Una forma de endometriosis con el tejido que recubre el interior del tero situado dentro de su tejido muscular (adenomiosis).   Tumores fibroides (no cancerosos) del tero.   Problemas en la vejiga, como infecciones, espasmos del tejido muscular de la vejiga.   Problemas intestinales (colon irritable, colitis, lcera o infeccin gastrointestinal).   Plipos en el cuello del tero.   Embarazo en las trompas (embarazo ectpico).   La apertura del cuello del tero es demasiado pequea para que fluya la sangre menstrual (estenosis cervical).   Maltratos fsicos o sexuales  (pasados o presentes).   Problemas msculoesquelticos debidos a una mala postura, problemas con las vrtebras de la zona baja de la espalda o cada de los msculos de la pelvis (prolapso).   Problemas psicolgicos como depresin o estrs.   DIU (dispositivo intrauterino).  DIAGNSTICO Las pruebas para realizar el diagnstico dependen del tipo, ubicacin, gravedad y causa del dolor. Las pruebas necesarias son:  Anlisis de sangre.   Anlisis de orina.   Ecografa.   Radiografas.   Tomografa computada.   MRI (imagen por resonancia magntica).   Laparoscopa.   Ciruga mayor.  TRATAMIENTO El tratamiento depender de la causa del dolor, el que incluye:  Analgsicos prescriptos o de venta libre.   Antibiticos:   Anticonceptivos.   Tratamiento hormonal.   Inyecciones para bloquear la conduccin nerviosa.   Fisioterapia.   Antidepresivos.   Psicoterapia con un psiquiatra o un psiclogo.   Ciruga mayor o menor.  INSTRUCCIONES PARA EL CUIDADO DOMICILIARIO  Slo tome medicamentos de venta libre o prescriptos para calmar el dolor, las molestias o bajar la fiebre segn las indicaciones de su mdico.   Siga los consejos de su mdico para tratar el dolor.   Reposo.   Evite las relaciones sexuales si le causan dolor.   Aplique compresas calientes o fras (que siempre son mejores) en la zona dolorida.   Haga ejercicios de relajacin, como yoga o meditacin.   Trate con acupuntura.   Evite las situaciones estresantes.   Trate con psicoterapia grupal.   Si el dolor se debe a molestias intestinales o estomacales, beba lquidos claros, siga una dieta blanda hasta   que los sntomas desaparezcan.  SOLICITE ANTENCIN MDICA SI:  Necesita analgsicos ms fuertes.   Tiene dolor durante las relaciones sexuales.   Tiene dolor al orinar.   La temperatura se eleva por encima de 102 F (38.9 C) cuando siente dolor.   An siente dolor luego de 4 horas e haber tomado  los analgsicos prescriptos.   Necesita antidepresivos.   El DIU le causa dolor y quiere retirarlo.  SOLICITE ATENCIN MDICA DE INMEDIATO SI:  Presenta dolor muy intenso.   Presenta se desmaya, tiene escalofros, debilidad extrema o deshidratacin.   Observa una hemorragia vaginal abundante o elimina tejidos.   Presenta la temperatura se eleva por encima de 102 F (38.9 C) cuando siente dolor.   Observa sangre en la orina.   Ha sufrido maltratos fsicos o sexuales.   Sufre vmitos y diarrea y no puede controlarlos.   Siente depresin y teme daarse a usted mismo o a otras personas.  Document Released: 06/07/2005 Document Revised: 02/17/2011 [employer] Patient Information 2012 [employer]. 
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
## 2011-06-08 ENCOUNTER — Encounter: Payer: Self-pay | Admitting: Internal Medicine

## 2011-06-08 ENCOUNTER — Telehealth: Payer: Self-pay | Admitting: *Deleted

## 2011-06-08 ENCOUNTER — Other Ambulatory Visit: Payer: Self-pay | Admitting: Obstetrics & Gynecology

## 2011-06-08 DIAGNOSIS — R102 Pelvic and perineal pain: Secondary | ICD-10-CM

## 2011-06-08 NOTE — Telephone Encounter (Signed)
Calling patient using [employer] [phone]. Informed pt of her ultrasound appt on 07/29/10 at 0900. Pt agrees with plan.

## 2011-06-17 ENCOUNTER — Ambulatory Visit: Payer: Self-pay | Admitting: Physical Medicine & Rehabilitation

## 2011-06-22 DIAGNOSIS — Z227 Latent tuberculosis: Secondary | ICD-10-CM

## 2011-06-22 HISTORY — DX: Latent tuberculosis: Z22.7

## 2011-06-25 ENCOUNTER — Ambulatory Visit (INDEPENDENT_AMBULATORY_CARE_PROVIDER_SITE_OTHER): Payer: Self-pay | Admitting: Internal Medicine

## 2011-06-25 DIAGNOSIS — S83206A Unspecified tear of unspecified meniscus, current injury, right knee, initial encounter: Secondary | ICD-10-CM | POA: Insufficient documentation

## 2011-06-25 DIAGNOSIS — N7013 Chronic salpingitis and oophoritis: Secondary | ICD-10-CM

## 2011-06-25 DIAGNOSIS — M25569 Pain in unspecified knee: Secondary | ICD-10-CM

## 2011-06-25 DIAGNOSIS — J45909 Unspecified asthma, uncomplicated: Secondary | ICD-10-CM

## 2011-06-25 DIAGNOSIS — R6889 Other general symptoms and signs: Secondary | ICD-10-CM | POA: Insufficient documentation

## 2011-06-25 DIAGNOSIS — M25561 Pain in right knee: Secondary | ICD-10-CM

## 2011-06-25 DIAGNOSIS — H579 Unspecified disorder of eye and adnexa: Secondary | ICD-10-CM

## 2011-06-25 DIAGNOSIS — N7011 Chronic salpingitis: Secondary | ICD-10-CM

## 2011-06-25 MED ORDER — ALBUTEROL SULFATE HFA 108 (90 BASE) MCG/ACT IN AERS: 2.0000 | INHALATION_SPRAY | Freq: Four times a day (QID) | RESPIRATORY_TRACT | Status: DC | PRN

## 2011-06-25 MED ORDER — NAPROXEN 250 MG PO TABS: 250.0000 mg | ORAL_TABLET | Freq: Two times a day (BID) | ORAL | Status: DC
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
## 2011-06-25 NOTE — Patient Instructions (Signed)
1. Stop taking Hydrocortisone and the antibiotic cream  2. Massage your eyes at least twice a day 3. Wash out your eyes at least twice a day 4. Use Warm compresses twice a day.

## 2011-06-25 NOTE — Assessment & Plan Note (Signed)
Wondering at this point about ligament involvement. I will prescribe naproxen 250 mg twice a day for 14 days. I will refer patient to sports medicine for further evaluation and management. I will furthermore we'll obtain an x-ray of the knee to evaluate for any bony abnormalities.

## 2011-06-25 NOTE — Assessment & Plan Note (Signed)
Itchy eyes and dryness around the eyes. Failed therapy with hydrocortisone and antibiotic therapy for one month. I would discontinue hydrocortisone and recommended to stop the antibiotic therapy at this point. Recommended conservative therapy including massage, warm compresses. Patient noticed that she has significant worsening of symptoms she may have to be referred to ophthalmology for further evaluation and management. She was a noted occasional some blurred vision. This would consider artificial tears for dry eyes. We'll reevaluate her during the next office visit.

## 2011-06-29 ENCOUNTER — Ambulatory Visit (HOSPITAL_COMMUNITY)
Admission: RE | Admit: 2011-06-29 | Discharge: 2011-06-29 | Disposition: A | Discharge status: Home / Self Care | Payer: Self-pay | Source: Ambulatory Visit | Attending: Internal Medicine | Admitting: Internal Medicine

## 2011-06-29 DIAGNOSIS — Z9181 History of falling: Secondary | ICD-10-CM | POA: Insufficient documentation

## 2011-06-29 DIAGNOSIS — M25561 Pain in right knee: Secondary | ICD-10-CM

## 2011-06-29 DIAGNOSIS — M25569 Pain in unspecified knee: Secondary | ICD-10-CM | POA: Insufficient documentation

## 2011-06-30 ENCOUNTER — Telehealth: Payer: Self-pay | Admitting: *Deleted

## 2011-06-30 MED ORDER — SERTRALINE HCL 100 MG PO TABS: 50.0000 mg | ORAL_TABLET | Freq: Every day | ORAL | Status: DC

## 2011-06-30 NOTE — Telephone Encounter (Signed)
Please clarify this RX: QUEtiapine (SEROQUEL) 50 MG tablet 60 tablet 6 04/23/2011 05/23/2011 Take 1 tablet (50 mg total) by mouth at bedtime. - Oral Comment: Take one tablet before bedtime for 1 week; then two tablets by mouth before bedtime.   Pt has been taking seroquel 25 mg daily.  Looks like you are increasing to 100 mg by taking 2 50 mg tabs at bedtime.  Pharmacy will hold until they here from [place].  [phone] This is not your pt but you wrote Rx on 11/2

## 2011-07-01 ENCOUNTER — Ambulatory Visit: Payer: Self-pay | Admitting: Physical Medicine & Rehabilitation

## 2011-07-01 ENCOUNTER — Encounter: Payer: Self-pay | Attending: Physical Medicine & Rehabilitation

## 2011-07-01 DIAGNOSIS — M79609 Pain in unspecified limb: Secondary | ICD-10-CM | POA: Insufficient documentation

## 2011-07-01 DIAGNOSIS — R209 Unspecified disturbances of skin sensation: Secondary | ICD-10-CM

## 2011-07-01 NOTE — Telephone Encounter (Signed)
Rx has been called into [facility]

## 2011-07-02 ENCOUNTER — Ambulatory Visit (INDEPENDENT_AMBULATORY_CARE_PROVIDER_SITE_OTHER): Payer: Self-pay | Admitting: Family Medicine

## 2011-07-02 VITALS — BP 110/60

## 2011-07-02 DIAGNOSIS — IMO0002 Reserved for concepts with insufficient information to code with codable children: Secondary | ICD-10-CM

## 2011-07-02 DIAGNOSIS — S83206A Unspecified tear of unspecified meniscus, current injury, right knee, initial encounter: Secondary | ICD-10-CM

## 2011-07-02 NOTE — Telephone Encounter (Signed)
I talked with Dr Meredith Pel for clarification of Zoloft and Seroquel.  He would like pt to be seen and bring all meds in with her for "Med Rec"  Pt scheduled for 1/16 and we will have interpreter present.

## 2011-07-02 NOTE — Progress Notes (Signed)
  Subjective:    Patient ID: Karen [name], female    DOB: 04-29-1966, 46 y.o.   MRN: [id number]  HPI  Right knee pain that's worse over the last 4-6 weeks. Has difficulty kneeling. 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 Her leg also feels like it will lock at times and she has to move it in different directions to see if she can get it working again. She is noted knee swelling but no redness. She's had no numbness in her distal leg or foot.  Review of Systems    see history of present illness. Objective:   Physical Exam  Vital signs reviewed. GENERAL: Well developed, well nourished, no acute distress RIGHT KNEE: Small effusion. Tender to palpation at the medial and lateral joint line. Full flexion and extension area positive McMurray. Normal Lachman. ULTRASOUND:  Fluid in the suprapatellar pouch. The quadriceps tendon is intact. Patellar tendon is intact but there is a small area at the proximal portion looks like old scar. The lateral meniscus is not well seen at all. The medial meniscus appears to have a mid substance tear. Aspiration/ INJECTION: Patient was given informed consent, signed copy in the chart.iInterpretor used for consent. Dr Ashley Jacobs and I performed the following procedure. Using ultrasound . Appropriate time out was taken. Area prepped and draped in usual sterile fashion; 5 cc of lidocaine 1% was used for local anaesthesia We aspirated 10 cc of straw colored normal appearing joint fluid... 2 cc of methylprednisolone 40 mg/ml plus  3 cc of 1% lidocaine without epinephrine was injected into the right knee  using a(n) lateral approach approach. The patient tolerated the procedure well. There were no complications. Post procedure instructions were given.     Assessment & Plan:  Knee pain likely secondary to at least one if not more meniscal tears. She does not have insurance and arthroscope he is not possible. Today we did  knee aspiration and injection with corticosteroid. I will see her back in 3-4 weeks to see how she's doing.

## 2011-07-07 ENCOUNTER — Telehealth: Payer: Self-pay | Admitting: *Deleted

## 2011-07-07 ENCOUNTER — Ambulatory Visit: Payer: Self-pay | Admitting: Internal Medicine

## 2011-07-07 NOTE — Telephone Encounter (Signed)
Pt here missed appointment time to discuss medications.  Pt had a question about the 2 different doses of her Seroquel.    Pt had been prescribed 25 mg and 50 mg.  Dr. Loistine Chance was consulted pt to take the 25 mg of Seroquel only.  Call to [facility] to hold prescription for the 50 mg Seroquel.  Pt to bring all bottles of medication to her rescheduled appointment on 07/26/2011.

## 2011-07-26 ENCOUNTER — Ambulatory Visit (INDEPENDENT_AMBULATORY_CARE_PROVIDER_SITE_OTHER): Payer: Self-pay | Admitting: Internal Medicine

## 2011-07-26 ENCOUNTER — Encounter: Payer: Self-pay | Admitting: Internal Medicine

## 2011-07-26 DIAGNOSIS — G4700 Insomnia, unspecified: Secondary | ICD-10-CM

## 2011-07-26 DIAGNOSIS — F411 Generalized anxiety disorder: Secondary | ICD-10-CM

## 2011-07-26 DIAGNOSIS — F3289 Other specified depressive episodes: Secondary | ICD-10-CM

## 2011-07-26 DIAGNOSIS — F419 Anxiety disorder, unspecified: Secondary | ICD-10-CM

## 2011-07-26 DIAGNOSIS — G8929 Other chronic pain: Secondary | ICD-10-CM

## 2011-07-26 DIAGNOSIS — F329 Major depressive disorder, single episode, unspecified: Secondary | ICD-10-CM

## 2011-07-26 DIAGNOSIS — R109 Unspecified abdominal pain: Secondary | ICD-10-CM

## 2011-07-26 MED ORDER — ZOLPIDEM TARTRATE 10 MG PO TABS: 10.0000 mg | ORAL_TABLET | ORAL | Status: DC

## 2011-07-26 MED ORDER — QUETIAPINE FUMARATE 50 MG PO TABS
50.0000 mg | ORAL_TABLET | Freq: Every day | ORAL | Status: DC
Start: 2011-07-26 — End: 2011-08-20

## 2011-07-26 MED ORDER — CLONAZEPAM 1 MG PO TABS: 1.0000 mg | ORAL_TABLET | Freq: Two times a day (BID) | ORAL | Status: DC | PRN

## 2011-07-26 NOTE — Progress Notes (Signed)
Due to language barrier, an interpreter was present during the history-taking and subsequent discussion (and for part of the physical exam) with this patient. Interpreter Wyvonnia Dusky at 11.00am Dr Clyde Lundborg 07/26/2011
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
## 2011-07-26 NOTE — Patient Instructions (Addendum)
1. Please follow-up in the clinic in one month,  2. Please stop taking naproxen. 3. Please take all medications as prescribed.  4. If you have worsening of your symptoms or new symptoms arise, please call the clinic ([phone]), or go to the ER immediately if symptoms are severe.

## 2011-07-27 ENCOUNTER — Encounter: Payer: Self-pay | Admitting: Internal Medicine

## 2011-07-27 DIAGNOSIS — G894 Chronic pain syndrome: Secondary | ICD-10-CM | POA: Insufficient documentation
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
## 2011-07-29 ENCOUNTER — Telehealth: Payer: Self-pay | Admitting: *Deleted

## 2011-07-29 NOTE — Telephone Encounter (Signed)
[facility] calls and needs clarification TODAY 07/29/2011, please review the seroquel dose and times daily, the zoloft dose and times daily.  The health dept has : seroquel 50mg , 1 at bedtime, comments states 1 at bedtime for 1 week then 2 at bedtime, script last filled 06/28/11 seroquel 25mg  1 at bedtime #30- this has been the script for 4 months, so are you increasing to 100mg  daily?  zoloft 100mg  1 tablet twice daily 04/23/11 filled 12/28  Please help  Thanks, Jiya Kissinger

## 2011-07-30 ENCOUNTER — Ambulatory Visit (HOSPITAL_COMMUNITY): Admission: RE | Admit: 2011-07-30 | Payer: Self-pay | Source: Ambulatory Visit

## 2011-08-02 ENCOUNTER — Ambulatory Visit (INDEPENDENT_AMBULATORY_CARE_PROVIDER_SITE_OTHER): Payer: Self-pay | Admitting: Family Medicine

## 2011-08-02 DIAGNOSIS — M7650 Patellar tendinitis, unspecified knee: Secondary | ICD-10-CM | POA: Insufficient documentation

## 2011-08-02 DIAGNOSIS — IMO0002 Reserved for concepts with insufficient information to code with codable children: Secondary | ICD-10-CM

## 2011-08-02 DIAGNOSIS — S83209A Unspecified tear of unspecified meniscus, current injury, unspecified knee, initial encounter: Secondary | ICD-10-CM | POA: Insufficient documentation

## 2011-08-02 NOTE — Progress Notes (Signed)
  Subjective:    Patient ID: Karen [name], female    DOB: 1966/03/30, 46 y.o.   MRN: [id number]  HPI  Interview conducted with use of an interpreter  Knee pain 40-60% better. She is having a little pain now over the patellar tendon that she had not noticed at last office visit. He is mostly painful to touch. The swelling in her knee has significantly improved. She is having much less pain with weightbearing and is able to walk better. She has not noticed any erythema or swelling of the knee.  Review of Systems Denies fever, sweats, chills.    Objective:   Physical Exam  Vital signs reviewed. GENERAL: Well developed, well nourished, no acute distress KNEE: Right knee full extension and flexion. No effusion. No erythema. Ligamentously intact. I did not perform at [facility] today. Mild tenderness to palpation of the patellar tendon.      Assessment & Plan:  #1 meniscal tear that seems significantly improved after corticosteroid injection last office visit #2. Mild patellar tendinitis. We'll put her on some home exercise program and topical Aspercreme. If she's not improved at next office visit would add the Chopat band, right now think she is too tender for that. Follow up one month.

## 2011-08-04 NOTE — Telephone Encounter (Signed)
Clarification per Dr Denton Meek Seroquel 50 mg take one at bedtime # 30 Zoloft 100 mg one daily # 30

## 2011-08-10 ENCOUNTER — Telehealth: Payer: Self-pay | Admitting: *Deleted

## 2011-08-10 NOTE — Telephone Encounter (Signed)
Pt called and wants to increase her Zoloft to 100 mg in the am and 100 mg in the PM.  She does not feel good with only taking once a day.  Pt's # [phone]

## 2011-08-16 ENCOUNTER — Other Ambulatory Visit: Payer: Self-pay | Admitting: Internal Medicine

## 2011-08-16 ENCOUNTER — Encounter: Payer: Self-pay | Admitting: Internal Medicine

## 2011-08-16 DIAGNOSIS — F329 Major depressive disorder, single episode, unspecified: Secondary | ICD-10-CM

## 2011-08-16 MED ORDER — SERTRALINE HCL 100 MG PO TABS: 100.0000 mg | ORAL_TABLET | Freq: Two times a day (BID) | ORAL | Status: DC

## 2011-08-16 MED ORDER — SERTRALINE HCL 100 MG PO TABS: 50.0000 mg | ORAL_TABLET | Freq: Every day | ORAL | Status: DC

## 2011-08-16 NOTE — Telephone Encounter (Signed)
This is a tough situation. I am not comfortable increasing her Zoloft over the phone. Unfortunately, she was just here but the focus of the visit was her abbd pain rather than med rec as Dr Meredith Pel requested last tele note. Pt has chronic pain syndrome and needs to be seen monthly with one provider - we have not been providing this to her so her care has been chaotic. Dr Denton Meek has no openings until April. I will refill for current dose - 50 BID and leave it up to PCP as to whether she can be escalated over the phone.   Elavil was D/C 7/12 bc ineffective.

## 2011-08-16 NOTE — Telephone Encounter (Signed)
Pt called again requesting Zoloft 100mg  BID ; also stated Amitriptyline was stopped.  And stated she really needs her medications. Thanks

## 2011-08-16 NOTE — Telephone Encounter (Signed)
Received verbal order from Dr Denton Meek for Zoloft 100mg  BID  Qty#60 w/ 6 refills; rx called to [facility]. Pt was called and made awared.

## 2011-08-20 ENCOUNTER — Ambulatory Visit (INDEPENDENT_AMBULATORY_CARE_PROVIDER_SITE_OTHER): Payer: Self-pay | Admitting: Internal Medicine

## 2011-08-20 ENCOUNTER — Encounter: Payer: Self-pay | Admitting: *Deleted

## 2011-08-20 VITALS — BP 121/79 | HR 60 | Temp 97.5°F

## 2011-08-20 DIAGNOSIS — F329 Major depressive disorder, single episode, unspecified: Secondary | ICD-10-CM

## 2011-08-20 MED ORDER — AMITRIPTYLINE HCL 50 MG PO TABS: 50.0000 mg | ORAL_TABLET | Freq: Every day | ORAL | Status: DC

## 2011-08-20 NOTE — Progress Notes (Unsigned)
Pt presnts w/ female person, she says hello but he speaks otherwise for pt. He states she is out of medicine and does not feel like doing anything. He shows a bottle from last fill at [facility] 2/13 for zoloft and it is empty, he states she has taken more than what was prescribed on the bottle due to her being unable to function he also states she needs the amitryptilline(sp) which has been prescribed by the mental health for the county but will not be seen there anymore due to her lack of a social security card. She is tearful and only speaks spanish. i spoke w/ dr Midwife, pt will be seen today w/  Graciella to translate for dr Anselm Jungling and pt. appt for 1445
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
## 2011-08-20 NOTE — Progress Notes (Unsigned)
Interpreter Wyvonnia Dusky for Dr Alfredo Bach at 14.45
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
## 2011-08-20 NOTE — Patient Instructions (Signed)
Start taking Elavil 50mg  at bedtime Start taking Zoloft 100mg  2 times daily Follow up with Dr. Anselm Jungling in 1 week

## 2011-08-27 ENCOUNTER — Encounter: Payer: Self-pay | Admitting: Internal Medicine

## 2011-08-27 ENCOUNTER — Ambulatory Visit (INDEPENDENT_AMBULATORY_CARE_PROVIDER_SITE_OTHER): Payer: Self-pay | Admitting: Internal Medicine

## 2011-08-27 VITALS — BP 120/75 | HR 73 | Temp 97.7°F | Wt 138.0 lb

## 2011-08-27 DIAGNOSIS — F3289 Other specified depressive episodes: Secondary | ICD-10-CM

## 2011-08-27 DIAGNOSIS — F329 Major depressive disorder, single episode, unspecified: Secondary | ICD-10-CM

## 2011-08-27 DIAGNOSIS — F32A Depression, unspecified: Secondary | ICD-10-CM

## 2011-08-27 MED ORDER — AMITRIPTYLINE HCL 50 MG PO TABS: 50.0000 mg | ORAL_TABLET | Freq: Every day | ORAL | Status: DC

## 2011-08-27 MED ORDER — SERTRALINE HCL 100 MG PO TABS: 100.0000 mg | ORAL_TABLET | Freq: Two times a day (BID) | ORAL | Status: DC

## 2011-08-27 NOTE — Patient Instructions (Addendum)
Continue current medications Followup with your PCP in 1 month

## 2011-08-27 NOTE — Assessment & Plan Note (Addendum)
Significant improvement over the last one week. Patient denies any suicidal ideation today. Her affect is very pleasant and not tearful unlike last office visit. Patient was only given 7 days worth of Elavil and Zoloft by the pharmacy due to some dosage confusion. - I spoke to pharmacy and will give a new prescription for Elavil 50 mg by mouth each bedtime and Zoloft 100 mg by mouth twice a day -Patient will followup with her PCP in April,2013
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
## 2011-08-27 NOTE — Progress Notes (Signed)
Interpreter Wyvonnia Dusky for Dr Anselm Jungling at 14.45

## 2011-09-02 ENCOUNTER — Ambulatory Visit (INDEPENDENT_AMBULATORY_CARE_PROVIDER_SITE_OTHER): Payer: Self-pay | Admitting: Family Medicine

## 2011-09-02 VITALS — BP 121/82

## 2011-09-02 DIAGNOSIS — M7651 Patellar tendinitis, right knee: Secondary | ICD-10-CM

## 2011-09-02 DIAGNOSIS — M7650 Patellar tendinitis, unspecified knee: Secondary | ICD-10-CM

## 2011-09-02 DIAGNOSIS — S93402A Sprain of unspecified ligament of left ankle, initial encounter: Secondary | ICD-10-CM

## 2011-09-02 DIAGNOSIS — S93409A Sprain of unspecified ligament of unspecified ankle, initial encounter: Secondary | ICD-10-CM

## 2011-09-02 MED ORDER — MELOXICAM 15 MG PO TABS: ORAL_TABLET | ORAL | Status: DC
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
## 2011-09-02 NOTE — Patient Instructions (Addendum)
Tomar el mobic( antinflamatorio) 1 tableta al dia despeus del desayuno Utilizar la tobillera con las [place]. Utilizar el strap de la rodilla con [place]. Poner hielo en la rodilla por 20 minutos dos veces al dia tambien ene el tobillo Hacer los ejercicios para el tobillo  Hacer los ejercicios para la rodilla.  mobic 1 tab qd  Med spec ankle brace Chopad strap Ice bid in ankle and knee HEP for ankle and knee F/U 4 weeks.

## 2011-09-09 ENCOUNTER — Other Ambulatory Visit: Payer: Self-pay

## 2011-09-09 ENCOUNTER — Emergency Department (HOSPITAL_COMMUNITY): Payer: Self-pay

## 2011-09-09 ENCOUNTER — Encounter (HOSPITAL_COMMUNITY): Payer: Self-pay | Admitting: *Deleted

## 2011-09-09 ENCOUNTER — Emergency Department (HOSPITAL_COMMUNITY)
Admission: EM | Admit: 2011-09-09 | Discharge: 2011-09-09 | Disposition: A | Discharge status: Home / Self Care | Payer: Self-pay | Attending: Emergency Medicine | Admitting: Emergency Medicine

## 2011-09-09 DIAGNOSIS — K589 Irritable bowel syndrome without diarrhea: Secondary | ICD-10-CM | POA: Insufficient documentation

## 2011-09-09 DIAGNOSIS — R079 Chest pain, unspecified: Secondary | ICD-10-CM | POA: Insufficient documentation

## 2011-09-09 DIAGNOSIS — H53149 Visual discomfort, unspecified: Secondary | ICD-10-CM | POA: Insufficient documentation

## 2011-09-09 DIAGNOSIS — G43909 Migraine, unspecified, not intractable, without status migrainosus: Secondary | ICD-10-CM | POA: Insufficient documentation

## 2011-09-09 DIAGNOSIS — R110 Nausea: Secondary | ICD-10-CM | POA: Insufficient documentation

## 2011-09-09 DIAGNOSIS — I10 Essential (primary) hypertension: Secondary | ICD-10-CM | POA: Insufficient documentation

## 2011-09-09 MED ORDER — DIPHENHYDRAMINE HCL 50 MG/ML IJ SOLN
25.0000 mg | Freq: Once | INTRAMUSCULAR | Status: AC
  Administered 2011-09-09: 25 mg via INTRAVENOUS
  Filled 2011-09-09: qty 1

## 2011-09-09 MED ORDER — METOCLOPRAMIDE HCL 5 MG/ML IJ SOLN
10.0000 mg | Freq: Once | INTRAMUSCULAR | Status: AC
  Administered 2011-09-09: 10 mg via INTRAVENOUS
  Filled 2011-09-09: qty 2

## 2011-09-09 MED ORDER — KETOROLAC TROMETHAMINE 30 MG/ML IJ SOLN
30.0000 mg | Freq: Once | INTRAMUSCULAR | Status: AC
  Administered 2011-09-09: 30 mg via INTRAVENOUS
  Filled 2011-09-09: qty 1

## 2011-09-09 MED ORDER — DEXAMETHASONE SODIUM PHOSPHATE 4 MG/ML IJ SOLN
10.0000 mg | Freq: Once | INTRAMUSCULAR | Status: AC
  Administered 2011-09-09: 10 mg via INTRAVENOUS
  Filled 2011-09-09: qty 3

## 2011-09-09 NOTE — ED Notes (Signed)
Pt has been having a severe frontal HA for 2 days.  Pt has had sensitivity to light and sound with this as well as nausea.  Pt states that the pain is 10/10

## 2011-09-09 NOTE — Discharge Instructions (Signed)
Cefalea migraosa (Migraine Headache) Una cefalea migraosa es un dolor de cabeza intenso en uno o ambos lados de la cabeza. No siempre se conoce la causa exacta. Una cefalea migraosa puede desencadenarse por varias causas.  Beber alcohol.   El hbito de fumar.   El estrs.   Pueden estar relacionadas con el perodo menstrual.   Quesos estacionados.   Alimentos o bebidas que contienen nitratos, glutamato, aspartamo o tiramina.   La falta de sueo.   Chocolate   Cafena.   Hambre   Medicamentos como nitroglicerina (se utiliza para tratar dolores en el pecho), pldoras anticonceptivas, estrgenos y algunos medicamentos para la presin sangunea.  CUIDADOS EN EL HOGAR  Muchos medicamentos ayudan a Secondary school teacher o impiden que reaparezca. El profesional que lo asiste lo ayudar a Teaching laboratory technician los mtodos de [place].   Si tiene Research officer, trade union, ser til [employer] habitacin oscura y [place].   Lleve un registro de [employer]. Esto le ayudar a Dentist. Por ejemplo, escriba:   Lo que come y bebe.   Cunto duerme.   Cualquier cambio en medicamentos o alimentos.  SOLICITE AYUDA DE INMEDIATO SI:  El medicamento no hace efecto.   El dolor reaparece.   Presenta rigidez en el cuello.   Presenta dificultad para ver.   Los msculos se debilitan o pierde el control de los mismos.   Desarrolla nuevos sntomas.   Pierde el equilibrio.   Presenta dificultad para caminar.   Sufre mareos o se desmaya.  ASEGRESE QUE:   Comprende estas instrucciones.   Controlar su enfermedad.   Solicitar ayuda de inmediato si no mejora o empeora.  Document Released: 09/03/2008 Document Revised: 05/27/2011 [facility] Patient Information 2012 [place], [place].  Return for any new or worsening symptoms or any other concerns.
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
## 2011-09-09 NOTE — ED Provider Notes (Addendum)
46 year old female comes in with a two-day history of a headache which is bifrontal and bitemporal with radiation to the occiput. There is associated nausea. She does not have any fever or chills. She's also been having some vague chest pain. On exam, she has tenderness palpation over her sinuses, over the temporalis muscles, and over the paracervical muscles. Lungs are clear. She will be given a headache cocktail and reassessed.  Dione Booze, MD 09/09/11 1905   Date: 09/09/2011  Rate: 74  Rhythm: normal sinus rhythm  QRS Axis: right  Intervals: normal  ST/T Wave abnormalities: normal  Conduction Disutrbances:none  Narrative Interpretation: Right axis deviation, otherwise normal ECG. No old ECG available for comparison.  Old EKG Reviewed: none available    Dione Booze, MD 09/09/11 2150

## 2011-09-09 NOTE — ED Notes (Signed)
IV with NS bolus started

## 2011-09-15 NOTE — ED Provider Notes (Signed)
I saw and evaluated the patient, reviewed the resident's note and I agree with the findings and plan.   Zeeva Courser, MD 09/15/11 1425 

## 2011-09-21 ENCOUNTER — Encounter: Payer: Self-pay | Admitting: Internal Medicine

## 2011-09-21 ENCOUNTER — Ambulatory Visit (INDEPENDENT_AMBULATORY_CARE_PROVIDER_SITE_OTHER): Payer: Self-pay | Admitting: Internal Medicine

## 2011-09-21 VITALS — BP 118/75 | HR 80 | Temp 97.2°F | Ht 62.0 in | Wt 137.9 lb

## 2011-09-21 DIAGNOSIS — F329 Major depressive disorder, single episode, unspecified: Secondary | ICD-10-CM

## 2011-09-21 DIAGNOSIS — G4700 Insomnia, unspecified: Secondary | ICD-10-CM

## 2011-09-21 DIAGNOSIS — E785 Hyperlipidemia, unspecified: Secondary | ICD-10-CM

## 2011-09-21 DIAGNOSIS — J45909 Unspecified asthma, uncomplicated: Secondary | ICD-10-CM

## 2011-09-21 LAB — LIPID PANEL
Cholesterol: 192 mg/dL (ref 0–200)
VLDL: 31 mg/dL (ref 0–40)

## 2011-09-21 MED ORDER — ALBUTEROL SULFATE HFA 108 (90 BASE) MCG/ACT IN AERS: 2.0000 | INHALATION_SPRAY | Freq: Four times a day (QID) | RESPIRATORY_TRACT | Status: DC | PRN

## 2011-09-21 MED ORDER — ZOLPIDEM TARTRATE 10 MG PO TABS: 10.0000 mg | ORAL_TABLET | Freq: Every evening | ORAL | Status: DC | PRN

## 2011-09-21 MED ORDER — SERTRALINE HCL 100 MG PO TABS: 100.0000 mg | ORAL_TABLET | Freq: Two times a day (BID) | ORAL | Status: DC

## 2011-09-21 MED ORDER — CLONAZEPAM 1 MG PO TABS: 1.0000 mg | ORAL_TABLET | Freq: Two times a day (BID) | ORAL | Status: DC | PRN

## 2011-09-21 MED ORDER — AMITRIPTYLINE HCL 50 MG PO TABS: 50.0000 mg | ORAL_TABLET | Freq: Every day | ORAL | Status: DC
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
## 2011-09-21 NOTE — Patient Instructions (Signed)
Please, f/u in 6 months and call with any questions. Social worker will call you regarding referral to a Psychiatrist. Thank you.

## 2011-09-22 ENCOUNTER — Encounter: Payer: Self-pay | Admitting: Internal Medicine

## 2011-09-22 DIAGNOSIS — J45909 Unspecified asthma, uncomplicated: Secondary | ICD-10-CM | POA: Insufficient documentation

## 2011-09-28 ENCOUNTER — Telehealth: Payer: Self-pay | Admitting: Licensed Clinical Social Worker

## 2011-09-28 NOTE — Telephone Encounter (Signed)
Karen [name] is a 46 y.o. female, Spanish speaking and has the [facility] card.  Karen [name] was referred to CSW for referral to psychiatry services. CSW called pt utilizing interpreter.  Pt is undocumented, no social security number.  Per [facility] [place], agencies that receives state funds are unable to assist with undocumented persons.  Pt states she was linked with [facility] but was discharged for this reason.  CSW referred pt to [facility] [phone], sliding fee scale and is able to assist with Spanish-speaking, undocumented persons.  Psychiatrist is available monthly after pt is established with counselor.  [facility] is also able to assist pt, however there is a $22.50 fee for the 2hr intake assessment, sliding scale thereafter.  CSW provided numbers and address to pt and will place information in the mail.  Pt is aware CSW is available to assist as needed.

## 2011-09-30 ENCOUNTER — Encounter: Payer: Self-pay | Admitting: Family Medicine

## 2011-09-30 ENCOUNTER — Ambulatory Visit (INDEPENDENT_AMBULATORY_CARE_PROVIDER_SITE_OTHER): Payer: Self-pay | Admitting: Family Medicine

## 2011-09-30 VITALS — BP 120/79 | HR 77

## 2011-09-30 DIAGNOSIS — S93402A Sprain of unspecified ligament of left ankle, initial encounter: Secondary | ICD-10-CM

## 2011-09-30 DIAGNOSIS — S93409A Sprain of unspecified ligament of unspecified ankle, initial encounter: Secondary | ICD-10-CM

## 2011-09-30 DIAGNOSIS — M7650 Patellar tendinitis, unspecified knee: Secondary | ICD-10-CM
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
## 2011-10-19 ENCOUNTER — Other Ambulatory Visit: Payer: Self-pay | Admitting: Internal Medicine

## 2011-10-19 DIAGNOSIS — F329 Major depressive disorder, single episode, unspecified: Secondary | ICD-10-CM

## 2011-10-19 MED ORDER — QUETIAPINE FUMARATE 50 MG PO TABS: 50.0000 mg | ORAL_TABLET | Freq: Every day | ORAL | Status: DC

## 2011-11-02 ENCOUNTER — Ambulatory Visit (INDEPENDENT_AMBULATORY_CARE_PROVIDER_SITE_OTHER): Payer: Self-pay | Admitting: Internal Medicine

## 2011-11-02 ENCOUNTER — Encounter: Payer: Self-pay | Admitting: Internal Medicine

## 2011-11-02 VITALS — BP 125/81 | HR 78 | Temp 97.6°F | Ht 62.0 in | Wt 137.8 lb

## 2011-11-02 DIAGNOSIS — M545 Low back pain, unspecified: Secondary | ICD-10-CM

## 2011-11-02 DIAGNOSIS — Z0000 Encounter for general adult medical examination without abnormal findings: Secondary | ICD-10-CM

## 2011-11-02 DIAGNOSIS — Z79899 Other long term (current) drug therapy: Secondary | ICD-10-CM

## 2011-11-02 DIAGNOSIS — Z111 Encounter for screening for respiratory tuberculosis: Secondary | ICD-10-CM

## 2011-11-02 DIAGNOSIS — M549 Dorsalgia, unspecified: Secondary | ICD-10-CM

## 2011-11-02 MED ORDER — TRAMADOL HCL ER 100 MG PO TB24: 100.0000 mg | ORAL_TABLET | Freq: Every day | ORAL | Status: DC | PRN
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
## 2011-11-02 NOTE — Progress Notes (Signed)
Addended by: Bufford Spikes on: 11/02/2011 04:50 PM   Modules accepted: Orders

## 2011-11-02 NOTE — Patient Instructions (Signed)
Por favor, devuelva a la clnica el jueves para un resultado PPD. Por favor, solicite la enfermera Fax resultados a la clnica de otro por requerimiento.

## 2011-11-03 LAB — RUBEOLA ANTIBODY IGG: Rubeola IgG: 1.27 {ISR} — ABNORMAL HIGH

## 2011-11-03 LAB — RPR

## 2011-11-03 LAB — MUMPS ANTIBODY, IGG: Mumps IgG: 2.03 {ISR} — ABNORMAL HIGH

## 2011-11-04 ENCOUNTER — Encounter: Payer: Self-pay | Admitting: Ophthalmology

## 2011-11-04 ENCOUNTER — Ambulatory Visit (INDEPENDENT_AMBULATORY_CARE_PROVIDER_SITE_OTHER): Payer: Self-pay | Admitting: Ophthalmology

## 2011-11-04 VITALS — BP 125/78 | HR 82 | Temp 97.8°F | Wt 136.8 lb

## 2011-11-04 DIAGNOSIS — Z227 Latent tuberculosis: Secondary | ICD-10-CM | POA: Insufficient documentation

## 2011-11-04 DIAGNOSIS — R7611 Nonspecific reaction to tuberculin skin test without active tuberculosis: Secondary | ICD-10-CM

## 2011-11-04 LAB — TB SKIN TEST: Induration: 15

## 2011-11-04 MED ORDER — ISONIAZID 300 MG PO TABS: 300.0000 mg | ORAL_TABLET | Freq: Every day | ORAL | Status: AC
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
## 2011-11-04 NOTE — Patient Instructions (Signed)
-  Toma una pastilla cada dia  -Si tiene algun sintoma de coloracion de la piel o los ojos o nausea o vomito, llame la clinica  [phone] -No tome alcohol

## 2011-11-04 NOTE — Assessment & Plan Note (Addendum)
Risks and benefits of treatment with [facility]H explained to patient. She agreed to proceed with 6 months of treatment. Got baseline LFTs and HIV testing. Advised her on symptoms of hepatitis to look for and call us if she experiences any jaundice, nausea or vomiting. She does not drink alcohol and was told not to drink alcohol while receiving treatment.  Patient wanted copies of vaccinations for immigration lawyer. Gave her copies of these and faxed them to the clinic with the law office as well.

## 2011-11-05 NOTE — Progress Notes (Signed)
Addended by: Bufford Spikes on: 11/05/2011 10:11 AM   Modules accepted: Orders

## 2011-11-08 ENCOUNTER — Other Ambulatory Visit: Payer: Self-pay | Admitting: Internal Medicine

## 2011-11-08 DIAGNOSIS — R7611 Nonspecific reaction to tuberculin skin test without active tuberculosis: Secondary | ICD-10-CM

## 2011-11-09 ENCOUNTER — Encounter: Payer: Self-pay | Admitting: Internal Medicine

## 2011-11-09 NOTE — Progress Notes (Signed)
Letter per Dr Denton Meek, in Spanish, mailed to pt about CXR.

## 2011-11-10 LAB — COMPREHENSIVE METABOLIC PANEL

## 2011-11-22 ENCOUNTER — Ambulatory Visit (HOSPITAL_COMMUNITY)
Admission: RE | Admit: 2011-11-22 | Discharge: 2011-11-22 | Disposition: A | Discharge status: Home / Self Care | Payer: Self-pay | Source: Ambulatory Visit | Attending: Internal Medicine | Admitting: Internal Medicine

## 2011-11-22 DIAGNOSIS — R7611 Nonspecific reaction to tuberculin skin test without active tuberculosis: Secondary | ICD-10-CM

## 2011-12-16 ENCOUNTER — Other Ambulatory Visit: Payer: Self-pay | Admitting: *Deleted

## 2011-12-16 MED ORDER — FENOFIBRATE 145 MG PO TABS: 145.0000 mg | ORAL_TABLET | Freq: Every day | ORAL | Status: DC

## 2011-12-16 NOTE — Telephone Encounter (Signed)
Rx called in to pharmacy. 

## 2012-01-11 ENCOUNTER — Emergency Department (HOSPITAL_COMMUNITY)
Admission: EM | Admit: 2012-01-11 | Discharge: 2012-01-12 | Disposition: A | Discharge status: Home / Self Care | Payer: Self-pay | Attending: Emergency Medicine | Admitting: Emergency Medicine

## 2012-01-11 ENCOUNTER — Encounter (HOSPITAL_COMMUNITY): Payer: Self-pay | Admitting: Emergency Medicine

## 2012-01-11 DIAGNOSIS — R110 Nausea: Secondary | ICD-10-CM | POA: Insufficient documentation

## 2012-01-11 DIAGNOSIS — G8929 Other chronic pain: Secondary | ICD-10-CM | POA: Insufficient documentation

## 2012-01-11 DIAGNOSIS — I10 Essential (primary) hypertension: Secondary | ICD-10-CM | POA: Insufficient documentation

## 2012-01-11 DIAGNOSIS — R109 Unspecified abdominal pain: Secondary | ICD-10-CM

## 2012-01-11 DIAGNOSIS — M549 Dorsalgia, unspecified: Secondary | ICD-10-CM | POA: Insufficient documentation

## 2012-01-11 DIAGNOSIS — Z79899 Other long term (current) drug therapy: Secondary | ICD-10-CM | POA: Insufficient documentation

## 2012-01-11 DIAGNOSIS — R0602 Shortness of breath: Secondary | ICD-10-CM | POA: Insufficient documentation

## 2012-01-11 DIAGNOSIS — R1031 Right lower quadrant pain: Secondary | ICD-10-CM | POA: Insufficient documentation

## 2012-01-11 DIAGNOSIS — F411 Generalized anxiety disorder: Secondary | ICD-10-CM | POA: Insufficient documentation

## 2012-01-11 DIAGNOSIS — F329 Major depressive disorder, single episode, unspecified: Secondary | ICD-10-CM | POA: Insufficient documentation

## 2012-01-11 DIAGNOSIS — F3289 Other specified depressive episodes: Secondary | ICD-10-CM | POA: Insufficient documentation

## 2012-01-11 DIAGNOSIS — R079 Chest pain, unspecified: Secondary | ICD-10-CM | POA: Insufficient documentation

## 2012-01-11 LAB — COMPREHENSIVE METABOLIC PANEL
ALT: 18 U/L (ref 0–35)
Alkaline Phosphatase: 44 U/L (ref 39–117)
CO2: 24 mEq/L (ref 19–32)
Chloride: 102 mEq/L (ref 96–112)
GFR calc Af Amer: 88 mL/min — ABNORMAL LOW (ref >90)
GFR calc non Af Amer: 76 mL/min — ABNORMAL LOW (ref >90)
Glucose, Bld: 93 mg/dL (ref 70–99)
Potassium: 4 mEq/L (ref 3.5–5.1)
Sodium: 138 mEq/L (ref 135–145)

## 2012-01-11 LAB — CBC WITH DIFFERENTIAL/PLATELET
Lymphocytes Relative: 44 % (ref 12–46)
Lymphs Abs: 3.4 10*3/uL (ref 0.7–4.0)
MCV: 92.5 fL (ref 78.0–100.0)
Neutrophils Relative %: 46 % (ref 43–77)
Platelets: 435 10*3/uL — ABNORMAL HIGH (ref 150–400)
RBC: 3.87 MIL/uL (ref 3.87–5.11)
WBC: 7.8 10*3/uL (ref 4.0–10.5)

## 2012-01-11 LAB — URINALYSIS, ROUTINE W REFLEX MICROSCOPIC
Bilirubin Urine: NEGATIVE
Ketones, ur: NEGATIVE mg/dL
Nitrite: NEGATIVE
Urobilinogen, UA: 0.2 mg/dL (ref 0.0–1.0)

## 2012-01-11 LAB — POCT I-STAT TROPONIN I: Troponin i, poc: 0 ng/mL (ref 0.00–0.08)

## 2012-01-11 MED ORDER — PROMETHAZINE HCL 25 MG/ML IJ SOLN
25.0000 mg | Freq: Four times a day (QID) | INTRAMUSCULAR | Status: DC | PRN
  Administered 2012-01-12: 25 mg via INTRAMUSCULAR
  Filled 2012-01-11: qty 1

## 2012-01-11 MED ORDER — SODIUM CHLORIDE 0.9 % IV SOLN
INTRAVENOUS | Status: DC
  Administered 2012-01-12: 01:00:00 via INTRAVENOUS

## 2012-01-11 MED ORDER — FENTANYL CITRATE 0.05 MG/ML IJ SOLN
25.0000 ug | INTRAMUSCULAR | Status: DC | PRN
  Administered 2012-01-12 (×2): 25 ug via INTRAVENOUS
  Filled 2012-01-11 (×2): qty 2

## 2012-01-11 NOTE — ED Notes (Signed)
Patient complaining of right lower quadrant abdominal pain, low back pain, leg pain, and nausea for the past two days.  Patient denies vomiting and diarrhea.

## 2012-01-12 ENCOUNTER — Emergency Department (HOSPITAL_COMMUNITY): Payer: Self-pay

## 2012-01-12 ENCOUNTER — Encounter (HOSPITAL_COMMUNITY): Payer: Self-pay | Admitting: Radiology

## 2012-01-12 ENCOUNTER — Other Ambulatory Visit: Payer: Self-pay

## 2012-01-12 LAB — PREGNANCY, URINE: Preg Test, Ur: NEGATIVE

## 2012-01-12 MED ORDER — HYDROCODONE-ACETAMINOPHEN 5-500 MG PO TABS: 2.0000 | ORAL_TABLET | Freq: Four times a day (QID) | ORAL | Status: AC | PRN

## 2012-01-12 MED ORDER — IOHEXOL 300 MG/ML  SOLN
20.0000 mL | INTRAMUSCULAR | Status: AC
  Administered 2012-01-12: 20 mL via ORAL

## 2012-01-12 MED ORDER — PROMETHAZINE HCL 25 MG PO TABS: 25.0000 mg | ORAL_TABLET | Freq: Four times a day (QID) | ORAL | Status: DC | PRN

## 2012-01-12 MED ORDER — IOHEXOL 350 MG/ML SOLN
100.0000 mL | Freq: Once | INTRAVENOUS | Status: AC | PRN
  Administered 2012-01-12: 100 mL via INTRAVENOUS
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
## 2012-01-13 ENCOUNTER — Encounter: Payer: Self-pay | Admitting: Internal Medicine

## 2012-01-13 ENCOUNTER — Ambulatory Visit (INDEPENDENT_AMBULATORY_CARE_PROVIDER_SITE_OTHER): Payer: Self-pay | Admitting: Internal Medicine

## 2012-01-13 VITALS — BP 113/76 | HR 81 | Temp 98.0°F | Wt 138.5 lb

## 2012-01-13 DIAGNOSIS — M549 Dorsalgia, unspecified: Secondary | ICD-10-CM

## 2012-01-13 DIAGNOSIS — F411 Generalized anxiety disorder: Secondary | ICD-10-CM

## 2012-01-13 DIAGNOSIS — E781 Pure hyperglyceridemia: Secondary | ICD-10-CM

## 2012-01-13 DIAGNOSIS — G4700 Insomnia, unspecified: Secondary | ICD-10-CM

## 2012-01-13 DIAGNOSIS — G894 Chronic pain syndrome: Secondary | ICD-10-CM

## 2012-01-13 DIAGNOSIS — F329 Major depressive disorder, single episode, unspecified: Secondary | ICD-10-CM

## 2012-01-13 MED ORDER — FENOFIBRATE 145 MG PO TABS: 145.0000 mg | ORAL_TABLET | Freq: Every day | ORAL | Status: DC

## 2012-01-13 MED ORDER — ZOLPIDEM TARTRATE 10 MG PO TABS: 10.0000 mg | ORAL_TABLET | Freq: Every evening | ORAL | Status: DC | PRN

## 2012-01-13 MED ORDER — SERTRALINE HCL 100 MG PO TABS: 100.0000 mg | ORAL_TABLET | Freq: Two times a day (BID) | ORAL | Status: DC

## 2012-01-13 MED ORDER — CLONAZEPAM 1 MG PO TABS: 1.0000 mg | ORAL_TABLET | Freq: Two times a day (BID) | ORAL | Status: DC | PRN

## 2012-01-13 MED ORDER — AMITRIPTYLINE HCL 50 MG PO TABS: 50.0000 mg | ORAL_TABLET | Freq: Every day | ORAL | Status: DC
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
## 2012-01-13 NOTE — Patient Instructions (Addendum)
Ejercicios para la espalda (Back Exercises) Estos ejercicios ayudan a tratar y prevenir lesiones en la espalda. El objetivo es aumentar la fuerza de los msculos abdominales y dorsales y la flexibilidad de la espalda. Debe comenzar con estos ejercicios cuando ya no tenga dolor. Los ejercicios para la espalda incluyen:  Inclinacin de la pelvis - Recustese sobre la espalda con las rodillas flexionadas. Incline la pelvis hasta que la parte inferior de la espalda se apoye en el piso. Mantenga esta posicin durante 5 a 10 segundos y repita entre 5 y 10 veces.   Rodilla al pecho - Empuje primero una rodilla contra el pecho y [place] 20 a 30 segundos; repita con la otra rodilla y luego con ambas a la vez. Esto puede realizarlo con la otra pierna extendida o flexionada, del modo en que se sienta ms cmodo.   Abdominales o despegar el cccix del suelo empleando la musculatura abdominal - Flexione las rodillas 90 grados. Comience inclinando la pelvis y realice un ejercicio abdominal lento y parcial, elevando el tronco slo entre 30 y 45 grados del suelo. Emplee al [employer] 2 y 3 segundos para cada abdominal. No realice los abdominales con las rodillas extendidas. Si le resulta difcil realizar abdominales parciales, simplemente haga lo que se explic anteriormente, pero slo contraiga los msculos abdominales y [employer] tal como se le ha indicado.   Inclinacin de la cadera - Recustese sobre la espalda con las rodillas flexionadas a 90 grados. Empjese con los pies y los hombros mientras eleva la cadera un par de centmetros del suelo, [place] durante 10 segundos y repita entre 5 y 10 veces.   Arcos dorsales - Acustese sobre el [place] e impulse el tronco hacia atrs sobre los codos flexionados. Presione lentamente con las manos, formando un arco con la zona inferior de la espalda. Repita entre 3 y 5 veces. Al realizar las repeticiones, luego de un tiempo disminuirn la rigidez y las  [place].   Elevacin de los hombros - Acustese hacia abajo con los brazos a los lados del cuerpo. Presione las caderas y Dance movement psychotherapist torso contra el suelo mientras eleva lentamente la cabeza y los hombros del suelo.  No exagere con los ejercicios, especialmente en el comienzo. Los ejercicios pueden causar alguna molestia leve en la espalda durante algunos minutos; sin embargo, si el dolor es muy intenso, o dura ms de 15 minutos, no siga con la actividad fsica hasta que consulte al profesional que lo asiste. Los problemas en la espalda mejoran de [place] lenta con esta terapia.  Consulte al profesional para que lo ayude a planificar un programa de ejercicios adecuado para su espalda. Document Released: 06/07/2005 Document Revised: 05/27/2011 [facility] Patient Information 2012 [place], [place].

## 2012-01-14 NOTE — Assessment & Plan Note (Addendum)
Pt is very pleasant today, appearing in good spirits. She requested refills of her Zoloft, Klonopin, and Amitriptyline which were provided.

## 2012-01-14 NOTE — Assessment & Plan Note (Signed)
Pt presents after being seen in the ED on 7/23 for mid and lower back pain and abdominal pain. The abdominal pain was likely due to constipation which has improved with laxative use. Her back pain is slightly improved since her visit to the ED. She was given Vicodin in the ED but has not taken it yet. She was told that she could take the Vicodin for her pain and continue her Tramadol as well. Due to the acute exacerbation of pain without any recent trauma or injury, suspect that her pain will continue to improve over then next month. She is also to start Physical Therapy here at the hospital. She is to follow-up in one month, and if her pain does not improve, we will consider obtaining an MRI. Karen [name] stated that she understood via an interpreter who was in the exam room.

## 2012-01-14 NOTE — Assessment & Plan Note (Signed)
Pt is very pleasant today, appearing in good spirits. She requested refills of her Zoloft, Klonopin, and Amitriptyline which were provided. She denies any other issues during this hosptialization, other than back pain.

## 2012-01-14 NOTE — Assessment & Plan Note (Signed)
>>  ASSESSMENT AND PLAN FOR HYPERTRIGLYCERIDEMIA WRITTEN ON 01/14/2012  3:32 PM BY Genelle Gather, MD  She endorses taking her mediations daily, as prescribed. She denies any issues with her Tricor. A lipid panel was not checked during this hospitalization. I did renew her Tricor during this office visit. Will defer to the pt's PCP for further management.

## 2012-01-14 NOTE — Assessment & Plan Note (Addendum)
She endorses taking her mediations daily, as prescribed. She denies any issues with her Tricor. A lipid panel was not checked during this hospitalization. I did renew her Tricor during this office visit. Will defer to the pt's PCP for further management.

## 2012-01-14 NOTE — Assessment & Plan Note (Signed)
She requested Ambien refills today which help with the insomnia. She was given a prescription during this visit to be filled in the hospital pharmacy.

## 2012-01-17 NOTE — Progress Notes (Signed)
agree

## 2012-01-27 ENCOUNTER — Ambulatory Visit: Payer: Self-pay | Attending: Internal Medicine | Admitting: Rehabilitation

## 2012-01-27 DIAGNOSIS — M2550 Pain in unspecified joint: Secondary | ICD-10-CM | POA: Insufficient documentation

## 2012-01-27 DIAGNOSIS — R293 Abnormal posture: Secondary | ICD-10-CM | POA: Insufficient documentation

## 2012-01-27 DIAGNOSIS — M2560 Stiffness of unspecified joint, not elsewhere classified: Secondary | ICD-10-CM | POA: Insufficient documentation

## 2012-01-27 DIAGNOSIS — IMO0001 Reserved for inherently not codable concepts without codable children: Secondary | ICD-10-CM | POA: Insufficient documentation

## 2012-02-08 ENCOUNTER — Ambulatory Visit: Payer: Self-pay | Admitting: Physical Therapy

## 2012-02-10 ENCOUNTER — Ambulatory Visit: Payer: Self-pay | Admitting: Rehabilitation

## 2012-02-15 ENCOUNTER — Encounter: Payer: Self-pay | Admitting: Rehabilitation

## 2012-02-17 ENCOUNTER — Ambulatory Visit: Payer: Self-pay | Admitting: Rehabilitation

## 2012-02-23 ENCOUNTER — Other Ambulatory Visit: Payer: Self-pay | Admitting: Internal Medicine

## 2012-02-23 NOTE — Telephone Encounter (Signed)
This Rx was just written at an appointment 01/13/12 with 5 refills.

## 2012-02-29 ENCOUNTER — Ambulatory Visit: Payer: No Typology Code available for payment source | Attending: Internal Medicine | Admitting: Physical Therapy

## 2012-02-29 DIAGNOSIS — M2550 Pain in unspecified joint: Secondary | ICD-10-CM | POA: Insufficient documentation

## 2012-02-29 DIAGNOSIS — R293 Abnormal posture: Secondary | ICD-10-CM | POA: Insufficient documentation

## 2012-02-29 DIAGNOSIS — IMO0001 Reserved for inherently not codable concepts without codable children: Secondary | ICD-10-CM | POA: Insufficient documentation

## 2012-02-29 DIAGNOSIS — M2560 Stiffness of unspecified joint, not elsewhere classified: Secondary | ICD-10-CM | POA: Insufficient documentation

## 2012-03-02 ENCOUNTER — Encounter: Payer: Self-pay | Admitting: Physical Therapy

## 2012-03-14 ENCOUNTER — Telehealth: Payer: Self-pay | Admitting: *Deleted

## 2012-03-14 DIAGNOSIS — F3289 Other specified depressive episodes: Secondary | ICD-10-CM

## 2012-03-14 DIAGNOSIS — F329 Major depressive disorder, single episode, unspecified: Secondary | ICD-10-CM

## 2012-03-14 DIAGNOSIS — F411 Generalized anxiety disorder: Secondary | ICD-10-CM

## 2012-03-14 MED ORDER — QUETIAPINE FUMARATE 50 MG PO TABS: 50.0000 mg | ORAL_TABLET | Freq: Every day | ORAL | Status: DC

## 2012-03-14 NOTE — Telephone Encounter (Signed)
Must be seen in clinic in next 2-3 weeks to address all psychiatric meds. Once month supply of seroquel given, no refills.

## 2012-03-14 NOTE — Telephone Encounter (Signed)
Pt request refill on seroquel 50 mg.  I don't see in her med list. I called [facility] and they said last Rx written  on 10/19/11 # 30 with 6 refills by Dr Denton Meek. Last filled on  8/26    Pt is out today and wants refill.

## 2012-03-16 ENCOUNTER — Ambulatory Visit: Payer: Self-pay | Admitting: Internal Medicine

## 2012-03-16 ENCOUNTER — Ambulatory Visit (INDEPENDENT_AMBULATORY_CARE_PROVIDER_SITE_OTHER): Payer: Self-pay | Admitting: Internal Medicine

## 2012-03-16 ENCOUNTER — Encounter: Payer: Self-pay | Admitting: Internal Medicine

## 2012-03-16 ENCOUNTER — Telehealth: Payer: Self-pay | Admitting: *Deleted

## 2012-03-16 VITALS — BP 125/80 | HR 76 | Temp 97.1°F | Ht 62.0 in | Wt 130.9 lb

## 2012-03-16 DIAGNOSIS — Z299 Encounter for prophylactic measures, unspecified: Secondary | ICD-10-CM

## 2012-03-16 DIAGNOSIS — E781 Pure hyperglyceridemia: Secondary | ICD-10-CM

## 2012-03-16 DIAGNOSIS — Z23 Encounter for immunization: Secondary | ICD-10-CM

## 2012-03-16 DIAGNOSIS — M7918 Myalgia, other site: Secondary | ICD-10-CM | POA: Insufficient documentation

## 2012-03-16 DIAGNOSIS — F411 Generalized anxiety disorder: Secondary | ICD-10-CM

## 2012-03-16 DIAGNOSIS — M545 Low back pain: Secondary | ICD-10-CM

## 2012-03-16 DIAGNOSIS — F329 Major depressive disorder, single episode, unspecified: Secondary | ICD-10-CM

## 2012-03-16 DIAGNOSIS — G4700 Insomnia, unspecified: Secondary | ICD-10-CM

## 2012-03-16 MED ORDER — CLONAZEPAM 1 MG PO TABS: 1.0000 mg | ORAL_TABLET | Freq: Two times a day (BID) | ORAL | Status: DC | PRN

## 2012-03-16 MED ORDER — FENOFIBRATE 145 MG PO TABS: 145.0000 mg | ORAL_TABLET | Freq: Every day | ORAL | Status: DC

## 2012-03-16 MED ORDER — TRAMADOL HCL 50 MG PO TABS: 50.0000 mg | ORAL_TABLET | Freq: Four times a day (QID) | ORAL | Status: DC | PRN

## 2012-03-16 MED ORDER — QUETIAPINE FUMARATE 50 MG PO TABS: 50.0000 mg | ORAL_TABLET | Freq: Every day | ORAL | Status: DC

## 2012-03-16 MED ORDER — CYCLOBENZAPRINE HCL 10 MG PO TABS: 10.0000 mg | ORAL_TABLET | Freq: Three times a day (TID) | ORAL | Status: DC | PRN

## 2012-03-16 NOTE — Telephone Encounter (Signed)
Spoke w Dr. Tonny Branch, he is seeing pt for acute visit this afternoon in clinic and will discuss appropriate Seroquel use with her. If she does not attend appt today, I will call her at home.  Thank you Bronson Curb
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
## 2012-03-16 NOTE — Patient Instructions (Signed)
1.  Use the Cyclobenzaprine 10 mg tablets up to 3 times daily for your back pain.  2.  Continue the physical therapy exercises at home as well for the flexibility and strength in your back.  3.  You can use a heating pad or ice on your back and neck as well.  20 minutes at a time to help relieve the pain as well.  4.  Continue working on getting back over to [place] for your psychiatrist.  5.  Follow up with your primary doctor in about 1 month.

## 2012-03-16 NOTE — Telephone Encounter (Signed)
[facility] states pt should have had seroquel to last thru 9/26, those refills were done 10/20/2011 11/16/2011 7/1 7/29 8/26 and picked up 8/27 They are very concerned over all the meds pt is taking and the possibility that due to language barrier that she may not be taking them correctly

## 2012-03-16 NOTE — Telephone Encounter (Signed)
Rx called into [facility] 

## 2012-03-20 ENCOUNTER — Emergency Department (HOSPITAL_COMMUNITY)
Admission: EM | Admit: 2012-03-20 | Discharge: 2012-03-21 | Disposition: A | Discharge status: Home / Self Care | Payer: Self-pay | Attending: Emergency Medicine | Admitting: Emergency Medicine

## 2012-03-20 DIAGNOSIS — F411 Generalized anxiety disorder: Secondary | ICD-10-CM | POA: Insufficient documentation

## 2012-03-20 DIAGNOSIS — F329 Major depressive disorder, single episode, unspecified: Secondary | ICD-10-CM | POA: Insufficient documentation

## 2012-03-20 DIAGNOSIS — I10 Essential (primary) hypertension: Secondary | ICD-10-CM | POA: Insufficient documentation

## 2012-03-20 DIAGNOSIS — F3289 Other specified depressive episodes: Secondary | ICD-10-CM | POA: Insufficient documentation

## 2012-03-20 DIAGNOSIS — G43909 Migraine, unspecified, not intractable, without status migrainosus: Secondary | ICD-10-CM | POA: Insufficient documentation

## 2012-03-20 DIAGNOSIS — G8929 Other chronic pain: Secondary | ICD-10-CM | POA: Insufficient documentation

## 2012-03-20 DIAGNOSIS — Z888 Allergy status to other drugs, medicaments and biological substances status: Secondary | ICD-10-CM | POA: Insufficient documentation

## 2012-03-20 LAB — CBC WITH DIFFERENTIAL/PLATELET
Basophils Relative: 0 % (ref 0–1)
Eosinophils Absolute: 0 10*3/uL (ref 0.0–0.7)
Lymphs Abs: 1.2 10*3/uL (ref 0.7–4.0)
MCH: 30.6 pg (ref 26.0–34.0)
MCHC: 33.1 g/dL (ref 30.0–36.0)
Neutro Abs: 8.8 10*3/uL — ABNORMAL HIGH (ref 1.7–7.7)
Neutrophils Relative %: 86 % — ABNORMAL HIGH (ref 43–77)
Platelets: 431 10*3/uL — ABNORMAL HIGH (ref 150–400)
RBC: 3.73 MIL/uL — ABNORMAL LOW (ref 3.87–5.11)

## 2012-03-20 MED ORDER — IBUPROFEN 800 MG PO TABS
800.0000 mg | ORAL_TABLET | Freq: Once | ORAL | Status: AC
Start: 2012-03-20 — End: 2012-03-20
  Administered 2012-03-20: 800 mg via ORAL
  Filled 2012-03-20: qty 1

## 2012-03-20 MED ORDER — ONDANSETRON HCL 4 MG/2ML IJ SOLN
4.0000 mg | Freq: Once | INTRAMUSCULAR | Status: AC
  Administered 2012-03-20: 4 mg via INTRAVENOUS
  Filled 2012-03-20: qty 2

## 2012-03-20 MED ORDER — SODIUM CHLORIDE 0.9 % IV BOLUS (SEPSIS)
1000.0000 mL | Freq: Once | INTRAVENOUS | Status: AC
  Administered 2012-03-20: 1000 mL via INTRAVENOUS

## 2012-03-20 MED ORDER — METOCLOPRAMIDE HCL 5 MG/ML IJ SOLN
10.0000 mg | Freq: Once | INTRAMUSCULAR | Status: AC
  Administered 2012-03-20: 10 mg via INTRAVENOUS
  Filled 2012-03-20: qty 2

## 2012-03-20 MED ORDER — PROMETHAZINE HCL 25 MG/ML IJ SOLN
25.0000 mg | Freq: Once | INTRAMUSCULAR | Status: AC
  Administered 2012-03-21: 25 mg via INTRAVENOUS
  Filled 2012-03-20: qty 1

## 2012-03-20 MED ORDER — DIPHENHYDRAMINE HCL 50 MG/ML IJ SOLN
25.0000 mg | Freq: Once | INTRAMUSCULAR | Status: AC
  Administered 2012-03-20: 25 mg via INTRAVENOUS
  Filled 2012-03-20: qty 1

## 2012-03-20 MED ORDER — KETOROLAC TROMETHAMINE 30 MG/ML IJ SOLN
30.0000 mg | Freq: Once | INTRAMUSCULAR | Status: AC
  Administered 2012-03-20: 30 mg via INTRAVENOUS
  Filled 2012-03-20: qty 1

## 2012-03-20 NOTE — ED Notes (Signed)
Pt reports frontal migraine headache radiating to the back of head associated with nausea, vomiting, sensitivity to light and sound.
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
## 2012-03-21 LAB — BASIC METABOLIC PANEL
GFR calc Af Amer: >90 mL/min (ref >90)
GFR calc non Af Amer: 87 mL/min — ABNORMAL LOW (ref >90)
Potassium: 4 mEq/L (ref 3.5–5.1)
Sodium: 140 mEq/L (ref 135–145)

## 2012-03-21 MED ORDER — IBUPROFEN 800 MG PO TABS: 800.0000 mg | ORAL_TABLET | Freq: Three times a day (TID) | ORAL | Status: DC

## 2012-03-21 MED ORDER — PROMETHAZINE HCL 25 MG PO TABS: 25.0000 mg | ORAL_TABLET | Freq: Four times a day (QID) | ORAL | Status: DC | PRN

## 2012-03-21 NOTE — ED Provider Notes (Signed)
Medical screening examination/treatment/procedure(s) were performed by non-physician practitioner and as supervising physician I was immediately available for consultation/collaboration.   Carleene Cooper III, MD 03/21/12 1226

## 2012-03-22 ENCOUNTER — Other Ambulatory Visit: Payer: Self-pay | Admitting: *Deleted

## 2012-03-22 DIAGNOSIS — F329 Major depressive disorder, single episode, unspecified: Secondary | ICD-10-CM

## 2012-03-22 NOTE — Telephone Encounter (Signed)
Pt was prescribed 1 year worth of sertraline by Dr. Sherrine Maples in July. She should have refills to last her through July 2014 at [facility]. She should be taking medication twice daily. Please notify patient of refills available at [facility]. She can return to the clinic if she has questions about medication or dosing frequency. Thank you.  Bronson Curb

## 2012-03-23 ENCOUNTER — Ambulatory Visit: Payer: Self-pay | Attending: Internal Medicine

## 2012-03-23 DIAGNOSIS — M2550 Pain in unspecified joint: Secondary | ICD-10-CM | POA: Insufficient documentation

## 2012-03-23 DIAGNOSIS — R293 Abnormal posture: Secondary | ICD-10-CM | POA: Insufficient documentation

## 2012-03-23 DIAGNOSIS — IMO0001 Reserved for inherently not codable concepts without codable children: Secondary | ICD-10-CM | POA: Insufficient documentation

## 2012-03-23 DIAGNOSIS — M2560 Stiffness of unspecified joint, not elsewhere classified: Secondary | ICD-10-CM | POA: Insufficient documentation

## 2012-04-04 ENCOUNTER — Ambulatory Visit: Payer: Self-pay | Admitting: Physical Therapy

## 2012-04-06 ENCOUNTER — Ambulatory Visit: Payer: Self-pay | Admitting: Physical Therapy

## 2012-04-11 ENCOUNTER — Ambulatory Visit: Payer: Self-pay | Admitting: Physical Therapy

## 2012-04-13 ENCOUNTER — Ambulatory Visit (INDEPENDENT_AMBULATORY_CARE_PROVIDER_SITE_OTHER): Payer: Self-pay | Admitting: Internal Medicine

## 2012-04-13 ENCOUNTER — Encounter: Payer: Self-pay | Admitting: Internal Medicine

## 2012-04-13 ENCOUNTER — Encounter: Payer: Self-pay | Admitting: Physical Therapy

## 2012-04-13 VITALS — BP 115/68 | HR 73 | Temp 97.5°F | Ht 63.0 in | Wt 129.8 lb

## 2012-04-13 DIAGNOSIS — G43909 Migraine, unspecified, not intractable, without status migrainosus: Secondary | ICD-10-CM

## 2012-04-13 DIAGNOSIS — M545 Low back pain, unspecified: Secondary | ICD-10-CM

## 2012-04-13 DIAGNOSIS — M7918 Myalgia, other site: Secondary | ICD-10-CM

## 2012-04-13 DIAGNOSIS — F411 Generalized anxiety disorder: Secondary | ICD-10-CM

## 2012-04-13 DIAGNOSIS — F3289 Other specified depressive episodes: Secondary | ICD-10-CM

## 2012-04-13 DIAGNOSIS — F329 Major depressive disorder, single episode, unspecified: Secondary | ICD-10-CM

## 2012-04-13 MED ORDER — PROMETHAZINE HCL 12.5 MG PO TABS: 12.5000 mg | ORAL_TABLET | Freq: Four times a day (QID) | ORAL | Status: DC | PRN

## 2012-04-13 NOTE — Patient Instructions (Addendum)
Please follow-up with [facility] tomorrow.  They will be able to manage many of your mood medications including the Klonopin and Seroquel. Keep your appointments with Physical Therapy for your back pain. I have refilled the Phenergan. Follow-up with Dr. Heloise Beecham in 4-6 months or sooner if needed.

## 2012-04-13 NOTE — Progress Notes (Signed)
  Subjective:    Patient ID: Karen [name], female    DOB: 10-Apr-1966, 46 y.o.   MRN: [id number]  HPI  Pt presents for post ED visit for migraine.  Pt states that headaches have been under control since last evaluation whereby she was given migraine cocktail and zofran and subsequently discharged with ibuprofen and phenergan.  States that she has been able to control occasional headaches with ice packs. No need for refills with exception of phenergan as needed when she gets a migraine. She expressed concern via Spanish interpreter that she couldn't be seen at [facility] do to issues with her social security number.  Review of Systems  Constitutional: Positive for unexpected weight change.       States that she is losing weight but only lost 1 pound over past month, previous weight lost was earlier this year (~10-15 lbs)  Cardiovascular: Negative for chest pain.  Musculoskeletal: Positive for back pain.  Neurological: Positive for speech difficulty and headaches. Negative for weakness and light-headedness.  Psychiatric/Behavioral: Negative for confusion, dysphoric mood and agitation.       Objective:   Physical Exam  Constitutional: She is oriented to person, place, and time. She appears well-developed and well-nourished. No distress.       Spanish interpreter present  HENT:  Head: Normocephalic and atraumatic.  Eyes: Conjunctivae normal and EOM are normal. Pupils are equal, round, and reactive to light.  Pulmonary/Chest: Effort normal and breath sounds normal.  Neurological: She is alert and oriented to person, place, and time.  Psychiatric: She has a normal mood and affect. Her behavior is normal. Judgment and thought content normal.          Assessment & Plan:  1. Migraines: controlled with ibuprofen, states that cyclobenzaprine initiated most recent -refill of Phenergan 12.5 mg given today for associated nausea  2. Depression/Anxiety: stable, advised to f/u with [facility]  tomorrow for medication management  3. Lumbar back pain: stable, cont with PT

## 2012-04-13 NOTE — Progress Notes (Signed)
Interpreter Yvonne Stopher Namihira for Dr Akosua 

## 2012-04-20 ENCOUNTER — Ambulatory Visit: Payer: Self-pay

## 2012-05-04 ENCOUNTER — Encounter: Payer: Self-pay | Admitting: Internal Medicine

## 2012-05-04 ENCOUNTER — Ambulatory Visit (INDEPENDENT_AMBULATORY_CARE_PROVIDER_SITE_OTHER): Payer: Self-pay | Admitting: Internal Medicine

## 2012-05-04 VITALS — BP 104/66 | HR 77 | Temp 98.4°F | Ht 63.0 in | Wt 127.0 lb

## 2012-05-04 DIAGNOSIS — N898 Other specified noninflammatory disorders of vagina: Secondary | ICD-10-CM

## 2012-05-04 DIAGNOSIS — R109 Unspecified abdominal pain: Secondary | ICD-10-CM | POA: Insufficient documentation

## 2012-05-04 NOTE — Assessment & Plan Note (Addendum)
She reports having chronic right lower quadrant pain, new onset LLQ pain for last 1 month and spotting that started yesterday. She was noticed to have multicystic right adnexal mass( hydrosalpinx)  on her transvaginal ultrasound back in November 2012 when she followed up with OB/GYN ( Dr. Debroah Loop) in December 2012 and was advised to followup again in 3 months but  seem like patient did not follow up them. On exam she was not noticed to have any vaginal masses or polyps I think her presentation could be related to ovarian cyst versus pelvic inflammatory disease. Other possibilities for left low quadrant pain includes constipation versus IBS. I do not think its diverticulitis in the absence of any fevers, blood in stools. -Obtain wet prep, GC/chlamydia. We'll also do Pap smear. -Schedule her transvaginal ultrasound and refer her to OB/GYN.
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
## 2012-05-04 NOTE — Patient Instructions (Addendum)
Please schedule a follow up appointment as needed . Please bring your medication bottles with your next appointment. Please take your medicines as prescribed. We will reschedule her to get a repeat pelvic ultrasound. Please schedule a follow up appointment with [facility] hospital.( last seen by Dr. Olivia Mackie in 12/12)

## 2012-05-07 ENCOUNTER — Encounter (HOSPITAL_COMMUNITY): Payer: Self-pay | Admitting: *Deleted

## 2012-05-07 ENCOUNTER — Other Ambulatory Visit: Payer: Self-pay

## 2012-05-07 ENCOUNTER — Emergency Department (HOSPITAL_COMMUNITY)
Admission: EM | Admit: 2012-05-07 | Discharge: 2012-05-08 | Disposition: A | Discharge status: Other Acute Inpatient Hospital | Payer: Self-pay | Attending: Emergency Medicine | Admitting: Emergency Medicine

## 2012-05-07 DIAGNOSIS — R109 Unspecified abdominal pain: Secondary | ICD-10-CM | POA: Insufficient documentation

## 2012-05-07 DIAGNOSIS — T438X2A Poisoning by other psychotropic drugs, intentional self-harm, initial encounter: Secondary | ICD-10-CM | POA: Insufficient documentation

## 2012-05-07 DIAGNOSIS — IMO0001 Reserved for inherently not codable concepts without codable children: Secondary | ICD-10-CM | POA: Insufficient documentation

## 2012-05-07 DIAGNOSIS — I10 Essential (primary) hypertension: Secondary | ICD-10-CM | POA: Insufficient documentation

## 2012-05-07 DIAGNOSIS — F411 Generalized anxiety disorder: Secondary | ICD-10-CM | POA: Insufficient documentation

## 2012-05-07 DIAGNOSIS — T43502A Poisoning by unspecified antipsychotics and neuroleptics, intentional self-harm, initial encounter: Secondary | ICD-10-CM | POA: Insufficient documentation

## 2012-05-07 DIAGNOSIS — Z8719 Personal history of other diseases of the digestive system: Secondary | ICD-10-CM | POA: Insufficient documentation

## 2012-05-07 DIAGNOSIS — F3289 Other specified depressive episodes: Secondary | ICD-10-CM | POA: Insufficient documentation

## 2012-05-07 DIAGNOSIS — Z8679 Personal history of other diseases of the circulatory system: Secondary | ICD-10-CM | POA: Insufficient documentation

## 2012-05-07 DIAGNOSIS — G4700 Insomnia, unspecified: Secondary | ICD-10-CM | POA: Insufficient documentation

## 2012-05-07 DIAGNOSIS — F329 Major depressive disorder, single episode, unspecified: Secondary | ICD-10-CM

## 2012-05-07 DIAGNOSIS — T1491XA Suicide attempt, initial encounter: Secondary | ICD-10-CM

## 2012-05-07 DIAGNOSIS — M7918 Myalgia, other site: Secondary | ICD-10-CM

## 2012-05-07 DIAGNOSIS — R079 Chest pain, unspecified: Secondary | ICD-10-CM | POA: Insufficient documentation

## 2012-05-07 DIAGNOSIS — Z79899 Other long term (current) drug therapy: Secondary | ICD-10-CM | POA: Insufficient documentation

## 2012-05-07 DIAGNOSIS — G8929 Other chronic pain: Secondary | ICD-10-CM | POA: Insufficient documentation

## 2012-05-07 LAB — URINALYSIS, ROUTINE W REFLEX MICROSCOPIC
Bilirubin Urine: NEGATIVE
Leukocytes, UA: NEGATIVE
Nitrite: NEGATIVE
Specific Gravity, Urine: 1.008 (ref 1.005–1.030)
Urobilinogen, UA: 1 mg/dL (ref 0.0–1.0)
pH: 5.5 (ref 5.0–8.0)

## 2012-05-07 LAB — RAPID URINE DRUG SCREEN, HOSP PERFORMED
Benzodiazepines: NOT DETECTED
Cocaine: NOT DETECTED
Opiates: NOT DETECTED

## 2012-05-07 LAB — CBC WITH DIFFERENTIAL/PLATELET
Basophils Absolute: 0 10*3/uL (ref 0.0–0.1)
Basophils Relative: 0 % (ref 0–1)
Eosinophils Absolute: 0.1 10*3/uL (ref 0.0–0.7)
MCH: 31.2 pg (ref 26.0–34.0)
MCHC: 34.2 g/dL (ref 30.0–36.0)
Neutro Abs: 7 10*3/uL (ref 1.7–7.7)
Neutrophils Relative %: 74 % (ref 43–77)
RDW: 13.4 % (ref 11.5–15.5)

## 2012-05-07 LAB — COMPREHENSIVE METABOLIC PANEL
AST: 21 U/L (ref 0–37)
Albumin: 4.7 g/dL (ref 3.5–5.2)
BUN: 13 mg/dL (ref 6–23)
Chloride: 101 mEq/L (ref 96–112)
Creatinine, Ser: 0.98 mg/dL (ref 0.50–1.10)
Potassium: 3.7 mEq/L (ref 3.5–5.1)
Total Bilirubin: 0.3 mg/dL (ref 0.3–1.2)
Total Protein: 8.1 g/dL (ref 6.0–8.3)

## 2012-05-07 LAB — ACETAMINOPHEN LEVEL: Acetaminophen (Tylenol), Serum: <15 ug/mL (ref 10–30)

## 2012-05-07 LAB — SALICYLATE LEVEL: Salicylate Lvl: <2 mg/dL — ABNORMAL LOW (ref 2.8–20.0)

## 2012-05-07 LAB — ETHANOL: Alcohol, Ethyl (B): <11 mg/dL (ref 0–11)

## 2012-05-07 MED ORDER — CYCLOBENZAPRINE HCL 10 MG PO TABS
10.0000 mg | ORAL_TABLET | Freq: Three times a day (TID) | ORAL | Status: DC | PRN
  Administered 2012-05-08 (×2): 10 mg via ORAL
  Filled 2012-05-07 (×2): qty 1

## 2012-05-07 MED ORDER — AMITRIPTYLINE HCL 25 MG PO TABS
50.0000 mg | ORAL_TABLET | Freq: Every day | ORAL | Status: DC
  Administered 2012-05-07: 50 mg via ORAL
  Filled 2012-05-07: qty 2

## 2012-05-07 MED ORDER — ONDANSETRON HCL 4 MG PO TABS
4.0000 mg | ORAL_TABLET | Freq: Three times a day (TID) | ORAL | Status: DC | PRN
  Administered 2012-05-07: 4 mg via ORAL
  Filled 2012-05-07: qty 1

## 2012-05-07 MED ORDER — TRAMADOL HCL 50 MG PO TABS
50.0000 mg | ORAL_TABLET | Freq: Four times a day (QID) | ORAL | Status: DC | PRN
  Administered 2012-05-07 – 2012-05-08 (×2): 50 mg via ORAL
  Filled 2012-05-07 (×2): qty 1

## 2012-05-07 MED ORDER — IBUPROFEN 200 MG PO TABS
600.0000 mg | ORAL_TABLET | Freq: Once | ORAL | Status: AC
  Administered 2012-05-07: 600 mg via ORAL
  Filled 2012-05-07: qty 3

## 2012-05-07 MED ORDER — SODIUM CHLORIDE 0.9 % IV SOLN
20.0000 mL | INTRAVENOUS | Status: DC
  Administered 2012-05-07: 20 mL via INTRAVENOUS

## 2012-05-07 MED ORDER — QUETIAPINE FUMARATE 50 MG PO TABS
50.0000 mg | ORAL_TABLET | Freq: Every day | ORAL | Status: DC
  Administered 2012-05-07: 50 mg via ORAL
  Filled 2012-05-07: qty 1

## 2012-05-07 MED ORDER — IBUPROFEN 600 MG PO TABS: 600.0000 mg | ORAL_TABLET | Freq: Three times a day (TID) | ORAL | Status: DC | PRN

## 2012-05-07 MED ORDER — IBUPROFEN 800 MG PO TABS
800.0000 mg | ORAL_TABLET | Freq: Three times a day (TID) | ORAL | Status: DC
  Administered 2012-05-07 – 2012-05-08 (×3): 800 mg via ORAL
  Filled 2012-05-07 (×3): qty 1

## 2012-05-07 MED ORDER — PROMETHAZINE HCL 25 MG/ML IJ SOLN
25.0000 mg | Freq: Once | INTRAMUSCULAR | Status: AC
  Administered 2012-05-07: 25 mg via INTRAVENOUS
  Filled 2012-05-07: qty 1

## 2012-05-07 MED ORDER — ZOLPIDEM TARTRATE 10 MG PO TABS
10.0000 mg | ORAL_TABLET | Freq: Every evening | ORAL | Status: DC | PRN
  Administered 2012-05-07: 10 mg via ORAL
  Filled 2012-05-07: qty 2

## 2012-05-07 MED ORDER — PROMETHAZINE HCL 25 MG PO TABS
25.0000 mg | ORAL_TABLET | Freq: Four times a day (QID) | ORAL | Status: DC | PRN
  Administered 2012-05-08 (×2): 25 mg via ORAL
  Filled 2012-05-07 (×3): qty 1
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
## 2012-05-07 NOTE — ED Notes (Signed)
Pt repeatedly requesting that we call her boyfriend.  Stated tearfully that "my boyfriend does not love me"

## 2012-05-07 NOTE — ED Notes (Addendum)
Pt requesting to call her boyfriend, pt gave boyfriend's number [phone]  [name])  to Dr Effie Shy. Call placed, boyfriend only notified that pt is in our department, no details given. Boyfriend stated he will come later today.

## 2012-05-07 NOTE — ED Notes (Signed)
Called received from poison control that they are closing out on patient at this time since pt is stable and labs wnl.MD notified.

## 2012-05-07 NOTE — ED Notes (Addendum)
Pt c/o chest pain, pt is sweating, and vomiting, EKG obtained, showed change of  ST depression since earlier tracing today.  Showed to MD, reported to charge nurse, troponin ordered and obtained.  Also sent tele psych consult, awaiting tele psych assessment. Karen [name]

## 2012-05-07 NOTE — ED Notes (Signed)
Poison Control contacted. Recommendations are to monitor for 4-6 hours or until asymptomatic, draw tox labs, get a Tylenol level at 1600, and fluid rehydration.

## 2012-05-07 NOTE — ED Notes (Signed)
Pt reports taking 60-100mg  zoloft at approximately 1250pm today. Pt admits to suicide attempt. Reports hx of depression.

## 2012-05-07 NOTE — ED Provider Notes (Signed)
Psychiatry consult complete. Admission recommended. BP 112/69  Pulse 92  Temp 99.1 F (37.3 C) (Oral)  Resp 16  SpO2 99%  LMP 02/08/2006   Glynn Octave, MD 05/07/12 2344

## 2012-05-08 ENCOUNTER — Emergency Department (HOSPITAL_COMMUNITY): Payer: Self-pay

## 2012-05-08 ENCOUNTER — Inpatient Hospital Stay (HOSPITAL_COMMUNITY)
Admission: AD | Admit: 2012-05-08 | Discharge: 2012-05-15 | DRG: 885 | Disposition: A | Discharge status: Home / Self Care | Payer: Federal, State, Local not specified - Other | Source: Ambulatory Visit | Attending: Psychiatry | Admitting: Psychiatry

## 2012-05-08 DIAGNOSIS — F411 Generalized anxiety disorder: Secondary | ICD-10-CM

## 2012-05-08 DIAGNOSIS — F339 Major depressive disorder, recurrent, unspecified: Secondary | ICD-10-CM | POA: Diagnosis present

## 2012-05-08 DIAGNOSIS — Z79899 Other long term (current) drug therapy: Secondary | ICD-10-CM

## 2012-05-08 DIAGNOSIS — F332 Major depressive disorder, recurrent severe without psychotic features: Principal | ICD-10-CM | POA: Diagnosis present

## 2012-05-08 DIAGNOSIS — IMO0002 Reserved for concepts with insufficient information to code with codable children: Secondary | ICD-10-CM

## 2012-05-08 DIAGNOSIS — I10 Essential (primary) hypertension: Secondary | ICD-10-CM | POA: Diagnosis present

## 2012-05-08 DIAGNOSIS — G4700 Insomnia, unspecified: Secondary | ICD-10-CM

## 2012-05-08 DIAGNOSIS — R45851 Suicidal ideations: Secondary | ICD-10-CM

## 2012-05-08 LAB — URINALYSIS, ROUTINE W REFLEX MICROSCOPIC
Glucose, UA: NEGATIVE mg/dL
Leukocytes, UA: NEGATIVE
Nitrite: NEGATIVE
Protein, ur: NEGATIVE mg/dL
Urobilinogen, UA: 1 mg/dL (ref 0.0–1.0)

## 2012-05-08 MED ORDER — TRAZODONE HCL 50 MG PO TABS
50.0000 mg | ORAL_TABLET | Freq: Every evening | ORAL | Status: DC | PRN
  Administered 2012-05-09 – 2012-05-14 (×9): 50 mg via ORAL
  Filled 2012-05-08 (×7): qty 1
  Filled 2012-05-08: qty 28
  Filled 2012-05-08 (×7): qty 1
  Filled 2012-05-08: qty 28

## 2012-05-08 MED ORDER — LORAZEPAM 2 MG/ML IJ SOLN
2.0000 mg | Freq: Once | INTRAMUSCULAR | Status: AC
  Administered 2012-05-08: 2 mg via INTRAVENOUS
  Filled 2012-05-08: qty 1

## 2012-05-08 MED ORDER — MAGNESIUM HYDROXIDE 400 MG/5ML PO SUSP: 30.0000 mL | Freq: Every day | ORAL | Status: DC | PRN

## 2012-05-08 MED ORDER — ALUM & MAG HYDROXIDE-SIMETH 200-200-20 MG/5ML PO SUSP
30.0000 mL | ORAL | Status: DC | PRN
  Administered 2012-05-09 – 2012-05-10 (×3): 30 mL via ORAL

## 2012-05-08 MED ORDER — TRAZODONE HCL 50 MG PO TABS
50.0000 mg | ORAL_TABLET | Freq: Once | ORAL | Status: AC
  Administered 2012-05-09: 50 mg via ORAL
  Filled 2012-05-08 (×2): qty 1

## 2012-05-08 MED ORDER — HYDROCODONE-ACETAMINOPHEN 5-325 MG PO TABS
1.0000 | ORAL_TABLET | Freq: Once | ORAL | Status: AC
  Administered 2012-05-08: 1 via ORAL
  Filled 2012-05-08: qty 1

## 2012-05-08 MED ORDER — LORAZEPAM 1 MG PO TABS
2.0000 mg | ORAL_TABLET | Freq: Once | ORAL | Status: AC
  Administered 2012-05-08: 2 mg via ORAL
  Filled 2012-05-08: qty 2

## 2012-05-08 MED ORDER — NICOTINE 21 MG/24HR TD PT24
21.0000 mg | MEDICATED_PATCH | Freq: Every day | TRANSDERMAL | Status: DC
  Filled 2012-05-08 (×2): qty 1

## 2012-05-08 NOTE — ED Notes (Signed)
Patient has boyfriend visiting. Security behind nurses station is aware of situation. Boyfriend and patient are holding hands. Patient in NAD. Rn [place] notified.

## 2012-05-08 NOTE — ED Provider Notes (Addendum)
Patient boost today complaining of left lower quadrant abdominal pain that radiates into her back. She is here waiting for psychiatric admission to 2 suicidal attempts by overdosing on Zoloft. Telepsych also recommended admission.  Stomach and through prior notes patient's abdominal pain is new. She denies any urinary symptoms with it. She was complaining of feeling tired and inability to sleep. She is also very anxious and uncomfortable on exam. Repeat urine in CT to rule out kidney stone ordered. Will ensure that patient gets her home medications. Patient's urine is unremarkable today. CT is negative for any acute pathology for her pain. She was improved after pain medication and something for anxiety and sleep.  Gwyneth Sprout, MD 05/08/12 1610  Gwyneth Sprout, MD 05/08/12 1012  Gwyneth Sprout, MD 05/08/12 1112

## 2012-05-08 NOTE — ED Notes (Signed)
Placed "hat" in bathroom and advised patient needed urine sample.

## 2012-05-08 NOTE — ED Notes (Signed)
Patient transported to CT 
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
## 2012-05-08 NOTE — ED Notes (Signed)
Patient to be discharge with [facility] via ambulatory with steady gait. Respirations equal and unlabored. Skin warm and dry. No acute distress noted.

## 2012-05-08 NOTE — ED Notes (Signed)
Contacted [facility] unable to give report at this time; stated that they will call back for report.

## 2012-05-08 NOTE — ED Notes (Addendum)
Pt stated that unable to sleep after  Receiving Atvian.2mg  po this am,Ativan 2mg  iv order per Dr Anitra Lauth
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
## 2012-05-09 ENCOUNTER — Encounter (HOSPITAL_COMMUNITY): Payer: Self-pay | Admitting: *Deleted

## 2012-05-09 DIAGNOSIS — F339 Major depressive disorder, recurrent, unspecified: Secondary | ICD-10-CM

## 2012-05-09 DIAGNOSIS — F411 Generalized anxiety disorder: Secondary | ICD-10-CM

## 2012-05-09 LAB — TSH: TSH: 3.603 u[IU]/mL (ref 0.350–4.500)

## 2012-05-09 MED ORDER — SUMATRIPTAN SUCCINATE 25 MG PO TABS
25.0000 mg | ORAL_TABLET | ORAL | Status: DC | PRN
  Administered 2012-05-09 – 2012-05-12 (×2): 25 mg via ORAL
  Filled 2012-05-09 (×2): qty 1

## 2012-05-09 MED ORDER — QUETIAPINE FUMARATE 50 MG PO TABS
50.0000 mg | ORAL_TABLET | Freq: Every day | ORAL | Status: DC
  Administered 2012-05-09 – 2012-05-14 (×6): 50 mg via ORAL
  Filled 2012-05-09 (×3): qty 1
  Filled 2012-05-09: qty 14
  Filled 2012-05-09 (×4): qty 1

## 2012-05-09 MED ORDER — CLONAZEPAM 0.5 MG PO TABS
0.5000 mg | ORAL_TABLET | Freq: Once | ORAL | Status: AC
  Administered 2012-05-09: 0.5 mg via ORAL
  Filled 2012-05-09: qty 1

## 2012-05-09 MED ORDER — CLONAZEPAM 0.5 MG PO TABS: 0.5000 mg | ORAL_TABLET | Freq: Two times a day (BID) | ORAL | Status: DC

## 2012-05-09 MED ORDER — IBUPROFEN 600 MG PO TABS
600.0000 mg | ORAL_TABLET | Freq: Once | ORAL | Status: AC
  Administered 2012-05-09: 600 mg via ORAL

## 2012-05-09 MED ORDER — CLONAZEPAM 0.5 MG PO TABS
0.5000 mg | ORAL_TABLET | Freq: Two times a day (BID) | ORAL | Status: DC
  Administered 2012-05-09 – 2012-05-15 (×12): 0.5 mg via ORAL
  Filled 2012-05-09 (×12): qty 1

## 2012-05-09 MED ORDER — RANITIDINE HCL 150 MG PO TABS
150.0000 mg | ORAL_TABLET | ORAL | Status: DC
  Administered 2012-05-10 – 2012-05-15 (×6): 150 mg via ORAL
  Filled 2012-05-09: qty 1
  Filled 2012-05-09: qty 14
  Filled 2012-05-09 (×6): qty 1

## 2012-05-09 MED ORDER — IBUPROFEN 600 MG PO TABS
ORAL_TABLET | ORAL | Status: AC
  Filled 2012-05-09: qty 1

## 2012-05-09 MED ORDER — RANITIDINE HCL 150 MG/10ML PO SYRP: 150.0000 mg | ORAL_SOLUTION | Freq: Every morning | ORAL | Status: DC

## 2012-05-09 MED ORDER — FENOFIBRATE 160 MG PO TABS
160.0000 mg | ORAL_TABLET | Freq: Every day | ORAL | Status: DC
  Administered 2012-05-09 – 2012-05-14 (×6): 160 mg via ORAL
  Filled 2012-05-09 (×6): qty 1
  Filled 2012-05-09: qty 14
  Filled 2012-05-09: qty 1

## 2012-05-09 NOTE — Clinical Social Work Note (Signed)
[facility] Group Notes:  (Counselor/Nursing/MHT/Case Management/Adjunct)         Feelings About Diagnosis         05/09/2012   1:15   Type of Therapy: Group  Participation Level:  Minimal  Participation Quality:  Attentive  Affect:  Appropriate  Cognitive:  Alert and Appropriate  Insight: Minimal  Engagement in Group:  Good  Engagement in Therapy:  Minimal  Modes of Intervention:  Education, Dentist, Support and Exploration  Summary of Progress/Problems:  Museum/gallery curator listened attentively through Nurse, learning disability.  She shared she had found it difficult to think about living if her son, who had a heart attack died.  She advised that is what led to her ODing.  Patient left group early stating she had a headachel   Wynn Banker 05/09/2012 4:16 PM

## 2012-05-09 NOTE — Progress Notes (Signed)
Psychoeducational Group Note  Date:  05/09/2012 Time: 1100  Group Topic/Focus:  Recovery Goals:   The focus of this group is to identify appropriate goals for recovery and establish a plan to achieve them.  Participation Level: Did Not Attend  Participation Quality:  Not Applicable  Affect:  Not Applicable  Cognitive:  Not Applicable  Insight:  Not Applicable  Engagement in Group: Not Applicable  Additional Comments: Patient did not attend group.  Karleen Hampshire Brittini 05/09/2012, 2:49 PM
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
## 2012-05-09 NOTE — Progress Notes (Signed)
D: Pt remained in room sleeping or resting with eyes closed throughout night following admission and administration of PRN sleep medication.  Contracted for safety on unit.  A: Pt given PRN Trazodone 50mg  PO x1 @0005 .  Medication administered according to med order and initial POC.  Q15 minute safe checks maintained as per unit policy.  R: Safety maintained. Dion Saucier RN

## 2012-05-09 NOTE — Progress Notes (Signed)
Nutrition Brief Note  Patient identified on the Malnutrition Screening Tool (MST) Report  Body mass index is 23.05 kg/(m^2). Pt meets criteria for wnl based on current BMI.   Current diet order is Regular, patient is consuming approximately 50% of meals at this time per her report. Labs and medications reviewed.   Pt denies nutrition-related concerns.  Intake likely adequate based on discussion with pt. RD notes 23 lbs wt loss over the past year (15% usual wt in one yr).  Pt with frequent wt change due to depression. No nutrition interventions warranted at this time. If nutrition issues arise, please consult RD.   Loyce Dys, MS RD LDN Clinical Inpatient Dietitian Pager: [phone] Weekend/After hours pager: [phone]

## 2012-05-09 NOTE — Progress Notes (Signed)
[facility] Group Notes:  (Counselor/Nursing/MHT/Case Management/Adjunct)  05/09/2012 8:15PM  Type of Therapy:  Group Therapy  Participation Level:  Active  Participation Quality:  Appropriate  Affect:  Appropriate  Cognitive:  Alert and Oriented  Insight:  Limited  Engagement in Group:  Limited  Engagement in Therapy:  Limited  Modes of Intervention:  Clarification, Problem-solving and Support  Summary of Progress/Problems: Pt reported that she had a bad day. Pt did verbalize that one positive was that she was able to finally get rid of her headache. Pt stated that she wants to learn how to take care of herself more. Pt stated that she enjoys watching television and working but that working makes her happier.  Shellye Zandi, Randal Buba 05/09/2012, 10:01 PM
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
## 2012-05-09 NOTE — Social Work (Signed)
Aftercare Planning Group: 05/09/2012 9:45 A Patient did not attend discharge planning group      Patton Salles, LCSW
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
## 2012-05-09 NOTE — Progress Notes (Addendum)
D:  Patient's self inventory sheet, patient needs sleep medication, has poor appetite, low energy level, poor attention span.  Rated depression and anxiety #10.  Denied hopelessness.  Denied withdrawals.   Denied SI.  Stated she has anxiety which results in headache, chest pain, leg/body aches.  Zero pain goal.  Worst pain #8.  Does not know how to better care for herself after discharge.  Medication needs.  Does have discharge plans.  Needs money to buy medications after discharge.  A:  Support and encouragement given throughout day.  Support and safety checks completed as ordered. R:  Following treatment plan.  Denied SI and HI.  Denied visual hallucinations.  Stated she does hear voices/mumblings yesterday and today.  Contracts for safety.  Patient remains safe and receptive on unit.  Per MD request, nurse called [facility] and received the following information from pharmacy.  The following medications were filled by [facility] pharmacy on 03/29/2012:    Zoloft 200 mg qd,  Seroquel 50 mg qd;  Klonipin 1 mg bid;  Ambien 10 mg q hs;  Amitriptyline 75 mg q hs.   Spanish interpreter has been requested.     1430  Patient medicated for headache and anxiety, interpreter present.  Encouraged patient to rest, relax, try to let go of some of the stress she is feeling.  Patient is continually asking about her medications.  Patient stated she would rest and relax in her room at this time. 1440  Patient resting in bed, sipping on ginger ale. 1615  Patient given maalox.  Patient indicated to nurse that the anxiety was better, that her headache was allitle better, resting comfortably in bed.   A few minutes later MHT said patient in bed and said she was having chest pain.  VS BP 111/74 pulse 76, R18, T98.2 sitting.  Standing BP 101/62 pulse 83.  Per N.P. Instructions, patient given maalox and patient stated she had indigestion.  Presently patient resting comfortably in bed with eyes opened.  Encouraged patient to relax, let  go of her stress, rest, deep breathe exercises, etc.

## 2012-05-09 NOTE — Progress Notes (Signed)
D: Patient in her room with interpreter on approach.  Patient states she had been anxious today.  Patient is fixated on taking medications. Patient rates anxiety high. Patient denies depression.Patient states she wants to learn why she is here.  Patient denies SI/HI but states she hears mumbling at times.   A: Staff to monitor Q 15 mins for safety.  Encouragement and support offered.  Scheduled medications administered per orders. R: Patient remains safe on the unit.  Patient attended group with an interpreter tonight  Patient cooperative but anxious. Patient taking administered medications.

## 2012-05-10 MED ORDER — VENLAFAXINE HCL 37.5 MG PO TABS
37.5000 mg | ORAL_TABLET | Freq: Every day | ORAL | Status: DC
  Administered 2012-05-10 – 2012-05-15 (×6): 37.5 mg via ORAL
  Filled 2012-05-10 (×5): qty 1
  Filled 2012-05-10: qty 14
  Filled 2012-05-10 (×4): qty 1
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
## 2012-05-10 NOTE — Progress Notes (Signed)
D: Patient in hallway on approach.  Patient states she has been anxious tonight.  Patient states her medications are working.  On approach patient asks for her pills.  Patient appears to be fixated or preoccupied with taking her medication.  Patient states she was happy because her boyfriend came to visit her.  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  Patient denies SI/HI and denies AVH.  Patient Rates anxiety 8/10. A: Staff to monitor Q 15 mins for safety.  Encouragement and support offered.  Scheduled medications administered per orders. R: Patient remains safe on the unit.  Patient attended group tonight with her interpreter.  Patient calm and taking administered medications.  Patient did not want to wait for her sleep medications to start working before she was asking for more sleep medication.  Patient waited approximately 20 minutes and then stated she could not sleep writer instructed her o go to her room try relaxation, shower and turning off the lights and patient went to sleep.

## 2012-05-10 NOTE — Social Work (Signed)
Interdisciplinary Treatment Plan Update (Adult)  Date:  05/10/2012  Time Reviewed:  11:27 AM    Progress in Treatment: Attending groups:   Yes   Participating in groups:  Yes Taking medication as prescribed:  Yes Tolerating medication:  Yes Family/Significant othe contact made: Contact made with family Patient understands diagnosis:  Yes Discussing patient identified problems/goals with staff: Yes Medical problems stabilized or resolved: Yes Denies suicidal/homicidal ideation: No -  Patient able to contract for safety Issues/concerns per patient self-inventory:  Other:  New problem(s) identified: Patient requires a Spanish-speaking interpreter  Reason for Continuation of Hospitalization: Anxiety Depression Medication stabilization Suicidal ideation  Interventions implemented related to continuation of hospitalization:  Medication mgement; safety checks q 15 mins; coping skills development  Additional comments: Patient requires a Spanish-speaking interpreter that will be scheduled  Estimated length of stay: 4 days  Discharge Plan:  Outpatient follow up to be scheduled  New goal(s):  Review of initial/current patient goals per problem list:    1.  Goal(s): Eliminate SI/other thoughts of self harm   Met:  No  Target date: d/c  As evidenced by: Patient will no longer endorse SI/HI or other thoughts of self harm.    2.  Goal (s):Reduce depression/anxiety  Met: No  Target date: d/c  As evidenced by: Patient will rate symptoms at four or below    3.  Goal(s):.stabilize on meds   Met:  No  Target date: d/c  As evidenced by: Patient will report being stabilized on medications - less symptomatic    4.  Goal(s): Refer for outpatient follow up   Met:  No  Target date: d/c  As evidenced by: Follow up appointment will be scheduled    Attendees: Patient:   [name]  11:27 AM  Physican:  Patrick North, MD [name]  11:27 AM  Nursing:   05/10/2012 11:27 AM    Nursing:    [name]  11:27 AM  Clinical Social Worker:  Patton Salles, LCSW [name]  11:27 AM  Other: Norval Gable, FNP 05/10/2012 11:27 AM   Other:         05/10/2012 11:27 AM Other:
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
## 2012-05-10 NOTE — Progress Notes (Signed)
  D) Patient spanish speaking, interpreter present for assessment. Patient quiet but  cooperative upon my assessment. Patient states slept "well," and  appetite is "poor." Patient rates depression as  3 /10, patient rates hopeless feelings as  4/10. Patient denies HI, denies audio hallucinations. Patient endorses passive SI, contracts verbally with RN for safety. Patient reports visual hallucinations, "I see shadows sometimes, not today."   A) Patient offered support and encouragement, patient encouraged to discuss feelings/concerns with staff. Patient verbalized understanding. Patient monitored Q15 minutes for safety. Patient met with MD to discuss today's goals and plan of care.  R) Patient isolates to room at times, attending some groups in day room and all meals in dining room.  Patient has a plan to "go to therapy and be more patient," once she is discharged from [facility]. Patient taking medications as ordered. Will continue to monitor.

## 2012-05-10 NOTE — Progress Notes (Signed)
Patient seen and assessed with help of interpreter. Agree with assessment, recommendations discussed prior to being initiated.

## 2012-05-10 NOTE — Progress Notes (Signed)
[facility] Group Notes:  (Counselor/Nursing/MHT/Case Management/Adjunct)  05/10/2012 5:00 PM  Type of Therapy:  Psychoeducational Skills  Participation Level:  Minimal  Participation Quality:  Attentive  Affect:  Appropriate  Cognitive:  Alert, Appropriate and Oriented  Insight:  Good  Engagement in Group:  Good  Engagement in Therapy:  n/a  Modes of Intervention:  Activity, Education, Problem-solving, Socialization and Support  Summary of Progress/Problems: Mozetta came late but did attend a psycho-education group that focused on using quality time with support systems/individuals to engage in health coping skills. Mihaela was attentive and spoke when prompted while group discussed who their supports are, how they can spend positive quality time with them as a coping skill and a way to strengthen their relationship. Kinya was given a homework assignment to find two ways to improve her support system and twenty activities she can do to spend quality time with her supports.     Wandra Scot 05/10/2012, 5:00 PM

## 2012-05-10 NOTE — Progress Notes (Signed)
Psychoeducational Group Note  Date:  05/10/2012 Time: 2000  Group Topic/Focus:  Wrap-Up Group:   The focus of this group is to help patients review their daily goal of treatment and discuss progress on daily workbooks.  Participation Level:  Active  Participation Quality:  Sharing  Affect:  Appropriate  Cognitive:  Alert  Insight:  Good  Engagement in Group:  Good  Additional Comments:  With the use of an interpreter patient shared that she was trying to work on her patience today.  Bora Bost, Newton Pigg 05/10/2012, 9:53 PM

## 2012-05-10 NOTE — Progress Notes (Signed)
Psychoeducational Group Note  Date:  05/10/2012 Time:  1100  Group Topic/Focus:  Personal Choices and Values:   The focus of this group is to help patients assess and explore the importance of values in their lives, how their values affect their decisions, how they express their values and what opposes their expression.  Participation Level:  Active  Participation Quality:  Appropriate, Attentive and Sharing  Affect:  Appropriate  Cognitive:  Appropriate  Insight:  Good  Engagement in Group:  Good  Additional Comments:  Patient attended personal choices and values group. Patient explained what were positive and negative choices and values that have been made throughout lifespan. Patient then completed worksheets on identifying values and choosing a value oriented life.   Karleen Hampshire Brittini 05/10/2012, 4:44 PM

## 2012-05-11 NOTE — Clinical Social Work Note (Signed)
[facility] Group Notes:  (Counselor/Nursing/MHT/Case Management/Adjunct)         Living a Balanced Life      05/11/2012   1:15   Type of Therapy: Group  Participation Level:  Minimal  Participation Quality:  Attentive  Affect:  Appropriate  Cognitive:  Alert and Appropriate  Insight:  Minimal  Engagement in Group:  Minimal  Engagement in Therapy:  Minimal  Modes of Intervention:  Education, Problem-solving, Support and Exploration  Summary of Progress/Problems: Patient listened attentively to speaker from [employer].  She shared she is often playful and pretends to be happy to cover up her true feelings.   Wynn Banker 05/11/2012 3:37 PM

## 2012-05-11 NOTE — Progress Notes (Signed)
Psychoeducational Group Note  Date:  05/11/2012 Time:  2000   Group Topic/Focus:  Karaoke  Participation Level:  Minimal  Participation Quality:  Appropriate and Attentive  Affect:  Appropriate  Cognitive:  Alert and Appropriate  Insight:  Limited  Engagement in Group:  Limited  Additional Comments:    Humberto Seals Monique 05/11/2012, 10:43 PM

## 2012-05-11 NOTE — Progress Notes (Signed)
Psychoeducational Group Note  Date:  05/11/2012 Time:  1000  Group Topic/Focus:  therapeutic activity  Participation Level:  Active  Participation Quality:  Appropriate and Attentive  Affect:  Appropriate  Cognitive:  Alert and Appropriate  Insight:  Good  Engagement in Group:  Good  Additional Comments:  Pt participated in group.  Marquis Lunch, Ashlley Booher 05/11/2012, 11:25 AM

## 2012-05-11 NOTE — Progress Notes (Signed)
[facility] Adult Inpatient Family/Significant Other Suicide Prevention Education  Suicide Prevention Education:  Patient Refusal for Family/Significant Other Suicide Prevention Education: The patient Karen [name] has refused to provide written consent for family/significant other to be provided Family/Significant Other Suicide Prevention Education during admission and/or prior to discharge.  Physician notified.  Patton Salles 05/11/2012, 10:25 AM

## 2012-05-11 NOTE — Progress Notes (Signed)
  D) Patient speaking through interpreter, patient does speak/understand some [place] conversation. Patient quiet but cooperative upon my assessment. Patient states slept "poor," and  appetite is " improving." Patient rates depression as  4 /10, patient rates hopeless feelings as  1/10. Patient denies SI/HI, denies A/V hallucinations.   A) Patient offered support and encouragement, patient encouraged to discuss feelings/concerns with staff. Patient verbalized understanding. Patient monitored Q15 minutes for safety. Patient met with MD to discuss today's goals and plan of care.  R) Patient active on unit, attending groups in day room and meals in dining room.  Patient has a plan to "take medicine as prescribed, try not to let the problems from other people interfere with my health." Patient taking medications as ordered. Will continue to monitor.

## 2012-05-11 NOTE — Social Work (Signed)
Pt attended discharge planning group and actively participated in group.  CSW provided pt with today's workbook. Patient's interpreter was present with patient reporting no anxiety and having no suicidal or homicidal ideations. Patient does report a 2 on a depression scale. Patient says her main goal is to learn to take better care of herself and patient agrees to attend outpatient counseling and sign consents to do so in presence of interpreter. Patient will followup with [facility].

## 2012-05-12 DIAGNOSIS — F332 Major depressive disorder, recurrent severe without psychotic features: Principal | ICD-10-CM

## 2012-05-12 DIAGNOSIS — F411 Generalized anxiety disorder: Secondary | ICD-10-CM

## 2012-05-12 NOTE — Progress Notes (Signed)
  D) Patient communicates with help from interpreter, interpreter also accompanies patient to groups. Patient pleasant and cooperative upon my assessment. Patient states slept "well after receiving medication," and  appetite is "good ." Patient rates depression as  1 /10, patient rates hopeless feelings as  1/10. Patient denies SI/HI, denies A/V hallucinations.   A) Patient offered support and encouragement, patient encouraged to discuss feelings/concerns with staff. Patient verbalized understanding. Patient monitored Q15 minutes for safety. Patient met with MD to discuss today's goals and plan of care.  R) Patient active on unit, attending groups in day room and meals in dining room.  Patient has a plan to "move from present living place, and take meds as prescribed," once she is discharged from [facility]. Patient taking medications as ordered. Will continue to monitor.

## 2012-05-12 NOTE — Progress Notes (Signed)
Patient ID: Karen [name], female   DOB: [birth year], 46 y.o.   MRN: [id number]  D: Pt uses interpretor, Pt understands and speaks  some english.  Patient appears  Depressed, but says she is ok. Calm and cooperative with assessment. No acute distressed noted. Denies SI/HI/AVH .Pt offered no additional questions or concerns.Pt complained about insomnia  A: safety has been maintained with Q15 minutes observation. Support and encouragement provided  Scheduled medication given.  R: Patient remains safe.  she is complaint with medication and group programming. Safety has been maintained Q15 and continue current POC.

## 2012-05-12 NOTE — Clinical Social Work Note (Signed)
[facility] Group Notes:  (Counselor/Nursing/MHT/Case Management/Adjunct)    05/12/2012   1:15   Type of Therapy: Group  Participation Level:  Active  Participation Quality:  Attentive  Affect:  Appropriate  Cognitive:  Alert and Appropriate  Insight:  Minimal  Engagement in Group:   Minimal  Engagement in Therapy:  Limited  Modes of Intervention:  Education, Problem-solving, Support and Exploration  Summary of Progress/Problems:  Karen [name] was unable to state how she could relapse but shared she wants to recover her son who is in [place].  She shares she really misses him.   Wynn Banker 05/12/2012 3:26 PM
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
## 2012-05-12 NOTE — Social Work (Signed)
Interdisciplinary Treatment Plan Update (Adult)  Date:  05/12/2012  Time Reviewed:  11:24 AM    Progress in Treatment: Attending groups:   Yes   Participating in groups:  Yes Taking medication as prescribed:  Yes Tolerating medication:  Yes Family/Significant othe contact made: Refused Patient understands diagnosis:  Yes Discussing patient identified problems/goals with staff: Yes Medical problems stabilized or resolved: Yes Denies suicidal/homicidal ideation: Yes -  Issues/concerns per patient self-inventory:  Other:  New problem(s) identified: None Reason for Continuation of Hospitalization: Anxiety Depression Medication stabilization   Interventions implemented related to continuation of hospitalization:  Medication mgement; safety checks q 15 mins; coping skills development  Additional comments: Patient denies suicidal ideations but affect appears somewhat flat. Patient says she will be returning to ex-boyfriend's home and discharge plans to move out in about 10 days to live with her brother-in-law.  Estimated length of stay: Discharging Monday, 05/15/2012  Discharge Plan:  Outpatient follow up care scheduled  New goal(s):  Review of initial/current patient goals per problem list:    1.  Goal(s): Eliminate SI/other thoughts of self harm   Met:  Yes  Target date: d/c  As evidenced by: Patient will no longer endorse SI/HI or other thoughts of self harm.    2.  Goal (s):Reduce depression/anxiety  Met: No  Target date: d/c  As evidenced by: Patient will rate symptoms at four or below    3.  Goal(s):.stabilize on meds   Met:  No  Target date: d/c  As evidenced by: Patient will report being stabilized on medications - less symptomatic    4.  Goal(s): Refer for outpatient follow up   Met:  Yes  Target date: d/c  As evidenced by: Follow up appointment will be scheduled    Attendees: Patient:   [name]  11:24 AM  Physican:  Patrick North, MD  [name]  11:24 AM  Nursing:  Lynett Grimes 05/12/2012 11:24 AM  Nursing:    [name]  11:24 AM  Clinical Social Worker:  Patton Salles, LCSW [name]  11:24 AM  Other:Agnes Nwoko, PMH-NP 05/12/2012 11:24 AM   Other:         05/12/2012 11:24 AM Other:Romina Divirgilio Yetta Barre ,LCSW

## 2012-05-12 NOTE — Social Work (Signed)
Aftercare Planning Group: 05/12/2012 9:45 AM  Pt attended discharge planning group and actively participated in group.  CSW provided pt with today's workbook.  Pt presents with  Patient was present was interpreter and reports her depression and anxiety to be at level I and says she's having no suicidal thoughts. Patient's followup has been scheduled with patient agreeing to comply with outpatient treatment. Patient reports she is having no problems with her ex-boyfriend but does plan to move out in 10 days to live with her brother-in-law Patton Salles, LCSW 05/12/2012 9:45 AM

## 2012-05-12 NOTE — Progress Notes (Signed)
.  [facility] Group Notes:  (Counselor/Nursing/MHT/Case Management/Adjunct)  05/12/2012 1:31 PM  Type of Therapy:  Therapeutic Activity- Apples to Aples  Participation Level:  Active  Participation Quality:  Appropriate, Attentive and Sharing  Affect:  Appropriate  Cognitive:  Appropriate  Insight:  Good  Engagement in Group:  Good  Engagement in Therapy:  Good  Modes of Intervention:  Activity  Summary of Progress/Problems: Patient participated in therapeutic activity on apples to apples and had an interpreter. Patients were able to express how certain words are related within the game and patient was able to learn coping skills through the game by being able to learn from others.    Karleen Hampshire Brittini 05/12/2012, 1:31 PM

## 2012-05-13 DIAGNOSIS — F3289 Other specified depressive episodes: Secondary | ICD-10-CM

## 2012-05-13 DIAGNOSIS — F329 Major depressive disorder, single episode, unspecified: Secondary | ICD-10-CM

## 2012-05-13 NOTE — Clinical Social Work Note (Signed)
[facility] Group Notes:  (Clinical Social Work)  05/13/2012  3:00-4:00PM  Summary of Progress/Problems:   The main focus of today's process group was for the patient to identify ways in which they have in the past sabotaged their own recovery and reasons they may have done this/what they received from doing it. We then worked to identify a specific plan of identifying self-sabotage statements/practicing following them with a replacement statement, in order to avoid doing this when discharged from the hospital for this admission. The patient expressed that she gives herself the self-sabotaging message "You're not worth anything" and that she needs to work on not focusing on messages others people, for instance her son, give her.  Type of Therapy:  Group Therapy - Process  Participation Level:  Active  Participation Quality:  Attentive  Affect:  Blunted  Cognitive:  Alert and Oriented  Insight:  Limited  Engagement in Group:  Good  Engagement in Therapy:  Good  Modes of Intervention:  Clarification, Education, Limit-setting, Problem-solving, Socialization, Support and Processing   Ambrose Mantle, LCSW 05/13/2012, 5:22 PM
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
## 2012-05-13 NOTE — Progress Notes (Signed)
Goals Group Note  Date:  05/13/2012 Time: 0900  Group Topic/Focus:  Identifying Goals : The focus of this group is to help the patient identify  Goals they want to work on, to introduce their Saturday patient workbook and to help motivate them to begin to make healthier changes.  Participation Level:  active Participation Quality: good Affect: flat Cognitive:    Insight:  good  Engagement in Group: engaged  Additional Comments:  PDuke RN BC  1330

## 2012-05-13 NOTE — Progress Notes (Signed)
Patient ID: Karen [name], female   DOB: 21-Mar-1966, 46 y.o.   MRN: [id number]  D: Pt uses interpretor, Pt understands and speaks some english. Patient appears brighter than she did yesterday upon approach.  Calm and cooperative with assessment. No acute distressed noted. Denies SI/HI/AVH .Pt offered no additional questions or concerns. Pt say she slept ok last night.  A: safety has been maintained with Q15 minutes observation. Support and encouragement provided Scheduled medication given.   R: Patient remains safe. she is complaint with medication and group programming. Safety has been maintained Q15 and continue current POC.

## 2012-05-13 NOTE — Progress Notes (Signed)
Patient ID: Karen [name], female   DOB: 17-Jun-1966, 46 y.o.   MRN: [id number] D: Pt. In dayroom interacting with interpreter. Pt. Response to Clinical research associate through interpreter, reports no problems, asks about night meds. Pt. Pleasant, makes eye contact. Pt. Reports groups have been helpful "heard a lot of different therapies". Pt. Notes the one about self sabotage was informative.  A: Writer introduced self to client, informed her that meds will be administered between 2130-2200. Pt.verbalized agreement with administration times. Pt. Will be monitored q9min for safety. Staff encouraged group. R: pt. Is safe on the unit, Pt. Attended group.

## 2012-05-13 NOTE — Progress Notes (Signed)
D patient states she slept well last nite w/meds, her appetite is good and her energy level is normal, her ability to pay attention is good and she is depressed 1/10 and hopeless 1/10 today as well, she denies Si or Hi and no Av/H, she has no complaints and her goal is to take her meds without fail. She is attending group and has her interpreter w/her most of the time, takes her meds as ordered by MD and eats her meals in the DR A q62min safety checks continue and support offered thru interpreter, encouraged to talk to peers and staff R patient remains safe on the unit
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
## 2012-05-14 NOTE — Progress Notes (Signed)
Psychoeducational Group Note  Date:  05/14/2012 Time: 1015 Group Topic/Focus:  Making Healthy Choices:   The focus of this group is to help patients identify negative/unhealthy choices they were using prior to admission and identify positive/healthier coping strategies to replace them upon discharge.  Participation Level:  Active  Participation Quality:  Attentive  Affect:  Depressed  Cognitive:  Alert  Insight:  Limited  Engagement in Group:  Limited  Additional Comments:    Rich Brave 5:59 PM. 05/14/2012

## 2012-05-14 NOTE — Progress Notes (Signed)
Goals Group Note  Date:  05/14/2012 Time: 0900  Group Topic/Focus:  Identifying  Goals : The focus  Of this group is to help the patients identify goals they want to achieve, to introduce them to their Sunday Patient Workbooks and to help motivate them to begin to take the steps they need to..influenza order to become healthier individuals.  Participation Level:  Passive  Participation Quality: good  Affect: flat  Cognitive: good   Insight:  good  Engagement in Group: engaged  Additional Comments:   PDuke RN [facility] 05/14/2012 1545

## 2012-05-14 NOTE — H&P (Signed)
Agree with assessment and plan Karen [name] Karen [name], M.D. 

## 2012-05-14 NOTE — Progress Notes (Addendum)
D slept well last nite, appetite is good, energy level is normal, ability to pay attention is good, depressed 1/10 and hopeless 1/10, denies Si or Hi, taking meds as ordered by MD, going to Dr for meals, quiet in group but goes to them, no complaints this morning A q77min safety checks continue and support offered, quiet and reserved, encouraged to verbalize more when she can about herself and issues R patient remains safe on the unit

## 2012-05-14 NOTE — Clinical Social Work Note (Signed)
[facility] Group Notes:  (Clinical Social Work)  05/14/2012   3:00-4:00PM  Summary of Progress/Problems:   The main focus of today's process group was for the patient to define "support" and describe what healthy supports are, then to identify the patient's current support system and decide on other supports that can be put in place to prevent future hospitalizations.  An emphasis was placed on using therapist, doctor and problem-specific support groups to expand supports. The patient expressed through her interpreter that her boyfriend has surprised her by visiting and showing support.  She does not think this will last longer than 4-5 days once she gets home, and she will once again be her own support.  Type of Therapy:  Group Therapy  Participation Level:  Minimal  Participation Quality:  Attentive  Affect:  Blunted  Cognitive:  Alert and Oriented  Insight:  Limited  Engagement in Group:  Limited  Engagement in Therapy:  Limited  Modes of Intervention:  Clarification, Education, Limit-setting, Problem-solving, Socialization, Support and Processing   Ambrose Mantle, LCSW 05/14/2012,

## 2012-05-15 MED ORDER — VENLAFAXINE HCL 37.5 MG PO TABS: 37.5000 mg | ORAL_TABLET | Freq: Every day | ORAL | Status: DC

## 2012-05-15 MED ORDER — QUETIAPINE FUMARATE 50 MG PO TABS: 50.0000 mg | ORAL_TABLET | Freq: Every day | ORAL | Status: DC

## 2012-05-15 MED ORDER — RANITIDINE HCL 150 MG PO TABS: 150.0000 mg | ORAL_TABLET | ORAL | Status: DC

## 2012-05-15 MED ORDER — TRAZODONE HCL 50 MG PO TABS: 50.0000 mg | ORAL_TABLET | Freq: Every evening | ORAL | Status: DC | PRN

## 2012-05-15 MED ORDER — FENOFIBRATE 145 MG PO TABS: 145.0000 mg | ORAL_TABLET | Freq: Every day | ORAL | Status: DC
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
## 2012-05-15 NOTE — Progress Notes (Signed)
[facility] Case Management Discharge Plan:  Will you be returning to the same living situation after discharge: Yes,    At discharge, do you have transportation home?:No.hospital to call A cab For patient Do you have the ability to pay for your medications:Yes,     Interagency Information:     Release of information consent forms completed and in the chart;  Patient's signature needed at discharge.  Patient to Follow up at:  Follow-up Information    Follow up with [facility]. On 05/16/2012. (Patient is scheduled with Brand Males on Tuesday, November 26 at 1 PM for med check. Patient will be set up for a therapy appointment at that time)    Contact information:   [address] [place] [address] [phone]         Patient denies SI/HI:   No.    Safety Planning and Suicide Prevention discussed:  Yes,     Barrier to discharge identified:Yes,     Summary and Recommendations:patient to return home to boyfriends and her follow up with outpatient providers.   Patton Salles 05/15/2012, 9:52 AM

## 2012-05-15 NOTE — Social Work (Signed)
Interdisciplinary Treatment Plan Update (Adult)  Date:  05/15/2012  Time Reviewed:  9:56 AM    Progress in Treatment: Attending groups:   Yes   Participating in groups:  Yes Taking medication as prescribed:  Yes Tolerating medication:  Yes Family/Significant othe contact made: patient refused Patient understands diagnosis:  Yes Discussing patient identified problems/goals with staff: Yes Medical problems stabilized or resolved: Yes Denies suicidal/homicidal ideation yes -   Issues/concerns per patient self-inventory:  Other:  New problem(s) identified:none  Reason for Continuation of Hospitalization:   Interventions implemented related to continuation of hospitalization:  Medication mgement; safety checks q 15 mins; coping skills development  Additional comments:patient will followup with [facility] and will return home to boyfriend's home for 10 days until she can move in with her brother-in-law.  Estimated length of stay:discharging today  Discharge Plan:  Outpatient follow upscheduled  New goal(s):  Review of initial/current patient goals per problem list:    1.  Goal(s): Eliminate SI/other thoughts of self harm   Met:yes  Target date: d/c  As evidenced by: Patient will no longer endorse SI/HI or other thoughts of self harm.    2.  Goal (s):Reduce depression/anxiety  Met: Yes  Target date: d/c  As evidenced by: Patient will rate symptoms at four or below    3.  Goal(s):.stabilize on meds   Met:  yes  Target date: d/c  As evidenced by: Patient will report being stabilized on medications - less symptomatic    4.  Goal(s): Refer for outpatient follow up   Met:  BS  Target date: d/c  As evidenced by: Follow up appointment will be scheduled    Attendees: Patient:  Karen [name] [name]  9:56 AM  Physican:  Patrick North, MD [name]  9:56 AM  Nursing:  Neill Loft, RN 05/15/2012 9:56 AM  Nursing:    [name]  9:56 AM  Clinical Social Worker:   Patton Salles, LCSW [name]  9:56 AM  Other: Carolann Littler -NP 05/15/2012 9:56 AM   Other: Patton Salles, LCSW        05/15/2012 9:56 AM Other:

## 2012-05-15 NOTE — Progress Notes (Signed)
D/C instructions/meds/follow-up appointments reviewed, pt verbalized understanding, pt's belongings returned to pt, samples given. 
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
## 2012-05-15 NOTE — Progress Notes (Signed)
D.  Pt bright and pleasant this morning, slept well last night.  No complaints voiced.  Took over Pt's care at 2330.  A.  Support and encouragement offered R.  Will continue to monitor.

## 2012-05-16 NOTE — Progress Notes (Signed)
Patient Discharge Instructions:  After Visit Summary (AVS):   Faxed to:  05/16/12 Psychiatric Admission Assessment Note:   Faxed to:  05/16/12 Suicide Risk Assessment - Discharge Assessment:   Faxed to:  05/16/12 Faxed/Sent to the Next Level Care provider:  05/16/12 Faxed to [facility] @ [phone]  Jerelene Redden, 05/16/2012, 3:40 PM

## 2012-05-23 NOTE — Discharge Summary (Signed)
Reviewed

## 2012-06-05 ENCOUNTER — Other Ambulatory Visit: Payer: Self-pay | Admitting: *Deleted

## 2012-06-05 NOTE — Telephone Encounter (Signed)
Got 5 refills in Sep with each prescription giving her #90. Therefore, has refills at pharmacy. Will not need new Rx until April 2014.

## 2012-06-05 NOTE — Telephone Encounter (Signed)
Did more research. [facility] 2013 for OD and at D/C tramadol had been D/C'd and a lot of new pysch meds had been started, all of which likely interact with tramadol. Therefore, do not encourage her to fill it. She needs appt here, with PCP if at all possible, to ensure that she is getting proper pysch F/U and support.

## 2012-06-05 NOTE — Telephone Encounter (Signed)
An appt has been sched w/Dr Heloise Beecham 07/14/12.

## 2012-06-05 NOTE — Telephone Encounter (Signed)
Call from pt - requesting refill on Tramadol 50mg , q8hr PRN. States having abd/back pains also h/a's. Thanks

## 2012-06-07 ENCOUNTER — Telehealth: Payer: Self-pay | Admitting: *Deleted

## 2012-06-07 NOTE — Telephone Encounter (Signed)
Plain Seroquel is no longer available for free, but Seroquel XR is available. Needs new Rx if change is ok. From [facility] pharmacy.

## 2012-06-08 ENCOUNTER — Emergency Department (HOSPITAL_COMMUNITY)
Admission: EM | Admit: 2012-06-08 | Discharge: 2012-06-08 | Disposition: A | Discharge status: Home / Self Care | Payer: No Typology Code available for payment source | Attending: Emergency Medicine | Admitting: Emergency Medicine

## 2012-06-08 ENCOUNTER — Encounter (HOSPITAL_COMMUNITY): Payer: Self-pay | Admitting: Emergency Medicine

## 2012-06-08 ENCOUNTER — Other Ambulatory Visit: Payer: Self-pay | Admitting: Internal Medicine

## 2012-06-08 DIAGNOSIS — F3289 Other specified depressive episodes: Secondary | ICD-10-CM | POA: Insufficient documentation

## 2012-06-08 DIAGNOSIS — Z8679 Personal history of other diseases of the circulatory system: Secondary | ICD-10-CM | POA: Insufficient documentation

## 2012-06-08 DIAGNOSIS — K589 Irritable bowel syndrome without diarrhea: Secondary | ICD-10-CM | POA: Insufficient documentation

## 2012-06-08 DIAGNOSIS — I10 Essential (primary) hypertension: Secondary | ICD-10-CM | POA: Insufficient documentation

## 2012-06-08 DIAGNOSIS — R300 Dysuria: Secondary | ICD-10-CM | POA: Insufficient documentation

## 2012-06-08 DIAGNOSIS — G43909 Migraine, unspecified, not intractable, without status migrainosus: Secondary | ICD-10-CM | POA: Insufficient documentation

## 2012-06-08 DIAGNOSIS — F329 Major depressive disorder, single episode, unspecified: Secondary | ICD-10-CM | POA: Insufficient documentation

## 2012-06-08 DIAGNOSIS — Z3202 Encounter for pregnancy test, result negative: Secondary | ICD-10-CM | POA: Insufficient documentation

## 2012-06-08 DIAGNOSIS — Z9189 Other specified personal risk factors, not elsewhere classified: Secondary | ICD-10-CM | POA: Insufficient documentation

## 2012-06-08 DIAGNOSIS — G8929 Other chronic pain: Secondary | ICD-10-CM | POA: Insufficient documentation

## 2012-06-08 DIAGNOSIS — Z79899 Other long term (current) drug therapy: Secondary | ICD-10-CM | POA: Insufficient documentation

## 2012-06-08 DIAGNOSIS — M549 Dorsalgia, unspecified: Secondary | ICD-10-CM | POA: Insufficient documentation

## 2012-06-08 DIAGNOSIS — R51 Headache: Secondary | ICD-10-CM

## 2012-06-08 DIAGNOSIS — F411 Generalized anxiety disorder: Secondary | ICD-10-CM | POA: Insufficient documentation

## 2012-06-08 LAB — URINALYSIS, ROUTINE W REFLEX MICROSCOPIC
Glucose, UA: NEGATIVE mg/dL
Hgb urine dipstick: NEGATIVE
Leukocytes, UA: NEGATIVE
Specific Gravity, Urine: 1.027 (ref 1.005–1.030)
pH: 5 (ref 5.0–8.0)

## 2012-06-08 LAB — POCT PREGNANCY, URINE: Preg Test, Ur: NEGATIVE

## 2012-06-08 MED ORDER — HYDROCODONE-ACETAMINOPHEN 5-325 MG PO TABS
2.0000 | ORAL_TABLET | Freq: Once | ORAL | Status: AC
  Administered 2012-06-08: 2 via ORAL
  Filled 2012-06-08: qty 2

## 2012-06-08 MED ORDER — HYDROCODONE-ACETAMINOPHEN 5-325 MG PO TABS
1.0000 | ORAL_TABLET | Freq: Once | ORAL | Status: AC
  Administered 2012-06-08: 1 via ORAL
  Filled 2012-06-08: qty 1

## 2012-06-08 MED ORDER — KETOROLAC TROMETHAMINE 30 MG/ML IJ SOLN
15.0000 mg | Freq: Once | INTRAMUSCULAR | Status: AC
  Administered 2012-06-08: 15 mg via INTRAMUSCULAR
  Filled 2012-06-08: qty 1

## 2012-06-08 MED ORDER — QUETIAPINE FUMARATE ER 50 MG PO TB24: 50.0000 mg | ORAL_TABLET | Freq: Every day | ORAL | Status: DC

## 2012-06-08 MED ORDER — HYDROCODONE-ACETAMINOPHEN 5-325 MG PO TABS
1.0000 | ORAL_TABLET | ORAL | Status: DC | PRN
Start: 2012-06-08 — End: 2012-07-12

## 2012-06-08 MED ORDER — ONDANSETRON 4 MG PO TBDP
8.0000 mg | ORAL_TABLET | Freq: Once | ORAL | Status: AC
  Administered 2012-06-08: 8 mg via ORAL
  Filled 2012-06-08: qty 2

## 2012-06-08 NOTE — ED Notes (Signed)
Patient threw up norco.  Visible pills in vomitus.  MD aware.

## 2012-06-08 NOTE — ED Notes (Signed)
Patient claims started having back pain and migraine yesterday.   Patient claims the pain lessened a little today, but still 10/10 pain today.
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
## 2012-06-08 NOTE — Progress Notes (Signed)
Rx called in to [facility]. Stanton Kidney Cartier Washko RN 06/08/12 1:30PM

## 2012-06-08 NOTE — Telephone Encounter (Signed)
Hi Karen [name],  I've talked to pharmacist at [facility]. Patient picked up one month worth of Seroquel this morning. I have ordered the new formulation of Seroquel to start next month. Will you fax prescription to [facility]?  Thank you,  Bronson Curb

## 2012-06-08 NOTE — Progress Notes (Signed)
Rx called in to pharmacy. 

## 2012-07-05 ENCOUNTER — Other Ambulatory Visit: Payer: Self-pay | Admitting: *Deleted

## 2012-07-05 NOTE — Telephone Encounter (Signed)
I already called [facility] in December and spoke with them about changing her to XR. They should have active Rx for XR to start in January. Let me know if not the case and we can call in again. Thanks.

## 2012-07-05 NOTE — Telephone Encounter (Signed)
Also [facility] can only obtain seroquel xr, can we change to seroquel xr 50mg  1 every hs, this will be at no charge if it can be

## 2012-07-06 MED ORDER — ALBUTEROL SULFATE HFA 108 (90 BASE) MCG/ACT IN AERS: 2.0000 | INHALATION_SPRAY | Freq: Four times a day (QID) | RESPIRATORY_TRACT | Status: DC | PRN

## 2012-07-07 NOTE — Telephone Encounter (Signed)
Called pharm, it is straight

## 2012-07-11 ENCOUNTER — Encounter: Payer: Self-pay | Admitting: Internal Medicine

## 2012-07-12 ENCOUNTER — Emergency Department (HOSPITAL_COMMUNITY)
Admission: EM | Admit: 2012-07-12 | Discharge: 2012-07-12 | Disposition: A | Discharge status: Home / Self Care | Payer: No Typology Code available for payment source | Attending: Emergency Medicine | Admitting: Emergency Medicine

## 2012-07-12 ENCOUNTER — Encounter (HOSPITAL_COMMUNITY): Payer: Self-pay | Admitting: *Deleted

## 2012-07-12 ENCOUNTER — Emergency Department (HOSPITAL_COMMUNITY): Payer: No Typology Code available for payment source

## 2012-07-12 DIAGNOSIS — R1110 Vomiting, unspecified: Secondary | ICD-10-CM | POA: Insufficient documentation

## 2012-07-12 DIAGNOSIS — I10 Essential (primary) hypertension: Secondary | ICD-10-CM | POA: Insufficient documentation

## 2012-07-12 DIAGNOSIS — F329 Major depressive disorder, single episode, unspecified: Secondary | ICD-10-CM | POA: Insufficient documentation

## 2012-07-12 DIAGNOSIS — Z79899 Other long term (current) drug therapy: Secondary | ICD-10-CM | POA: Insufficient documentation

## 2012-07-12 DIAGNOSIS — G8929 Other chronic pain: Secondary | ICD-10-CM | POA: Insufficient documentation

## 2012-07-12 DIAGNOSIS — R109 Unspecified abdominal pain: Secondary | ICD-10-CM | POA: Insufficient documentation

## 2012-07-12 DIAGNOSIS — Z8709 Personal history of other diseases of the respiratory system: Secondary | ICD-10-CM | POA: Insufficient documentation

## 2012-07-12 DIAGNOSIS — K589 Irritable bowel syndrome without diarrhea: Secondary | ICD-10-CM | POA: Insufficient documentation

## 2012-07-12 DIAGNOSIS — F3289 Other specified depressive episodes: Secondary | ICD-10-CM | POA: Insufficient documentation

## 2012-07-12 DIAGNOSIS — G43909 Migraine, unspecified, not intractable, without status migrainosus: Secondary | ICD-10-CM | POA: Insufficient documentation

## 2012-07-12 DIAGNOSIS — Z8739 Personal history of other diseases of the musculoskeletal system and connective tissue: Secondary | ICD-10-CM | POA: Insufficient documentation

## 2012-07-12 DIAGNOSIS — F411 Generalized anxiety disorder: Secondary | ICD-10-CM | POA: Insufficient documentation

## 2012-07-12 HISTORY — DX: Other chronic pain: G89.29

## 2012-07-12 HISTORY — DX: Dorsalgia, unspecified: M54.9

## 2012-07-12 LAB — COMPREHENSIVE METABOLIC PANEL
AST: 18 U/L (ref 0–37)
Albumin: 4.3 g/dL (ref 3.5–5.2)
Alkaline Phosphatase: 40 U/L (ref 39–117)
BUN: 14 mg/dL (ref 6–23)
CO2: 24 mEq/L (ref 19–32)
Chloride: 103 mEq/L (ref 96–112)
Creatinine, Ser: 0.83 mg/dL (ref 0.50–1.10)
GFR calc non Af Amer: 83 mL/min — ABNORMAL LOW (ref >90)
Potassium: 3.5 mEq/L (ref 3.5–5.1)
Total Bilirubin: 0.3 mg/dL (ref 0.3–1.2)

## 2012-07-12 LAB — CBC WITH DIFFERENTIAL/PLATELET
Basophils Absolute: 0 10*3/uL (ref 0.0–0.1)
Basophils Relative: 0 % (ref 0–1)
HCT: 37.2 % (ref 36.0–46.0)
Hemoglobin: 12.6 g/dL (ref 12.0–15.0)
Lymphocytes Relative: 23 % (ref 12–46)
MCHC: 33.9 g/dL (ref 30.0–36.0)
Monocytes Absolute: 0.5 10*3/uL (ref 0.1–1.0)
Monocytes Relative: 7 % (ref 3–12)
Neutro Abs: 4.9 10*3/uL (ref 1.7–7.7)
Neutrophils Relative %: 68 % (ref 43–77)
WBC: 7.2 10*3/uL (ref 4.0–10.5)

## 2012-07-12 LAB — URINALYSIS, MICROSCOPIC ONLY
Bilirubin Urine: NEGATIVE
Glucose, UA: NEGATIVE mg/dL
Ketones, ur: NEGATIVE mg/dL
Protein, ur: NEGATIVE mg/dL
pH: 6 (ref 5.0–8.0)

## 2012-07-12 MED ORDER — KETOROLAC TROMETHAMINE 30 MG/ML IJ SOLN
30.0000 mg | Freq: Once | INTRAMUSCULAR | Status: AC
  Administered 2012-07-12: 30 mg via INTRAVENOUS
  Filled 2012-07-12: qty 1

## 2012-07-12 MED ORDER — ONDANSETRON HCL 4 MG PO TABS: 4.0000 mg | ORAL_TABLET | Freq: Three times a day (TID) | ORAL | Status: DC | PRN

## 2012-07-12 MED ORDER — SODIUM CHLORIDE 0.9 % IV BOLUS (SEPSIS)
1000.0000 mL | Freq: Once | INTRAVENOUS | Status: AC
  Administered 2012-07-12: 1000 mL via INTRAVENOUS

## 2012-07-12 MED ORDER — DIPHENHYDRAMINE HCL 50 MG/ML IJ SOLN
50.0000 mg | Freq: Once | INTRAMUSCULAR | Status: AC
  Administered 2012-07-12: 50 mg via INTRAVENOUS
  Filled 2012-07-12: qty 1

## 2012-07-12 MED ORDER — METOCLOPRAMIDE HCL 5 MG/ML IJ SOLN
10.0000 mg | Freq: Once | INTRAMUSCULAR | Status: AC
  Administered 2012-07-12: 10 mg via INTRAVENOUS
  Filled 2012-07-12: qty 2
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
## 2012-07-12 NOTE — ED Notes (Signed)
Pt reports having migraine, hx of same. Having headache, abd pain and vomiting since Monday, denies vomiting. Reports hitting her head on wall yesterday, denies loc, abrasion noted to back of head.

## 2012-07-12 NOTE — ED Notes (Signed)
Updated on wait time. Verbalizes undersstanding

## 2012-07-12 NOTE — ED Notes (Signed)
Notified pt that we need urine sample, gave pt urine cup.

## 2012-07-14 ENCOUNTER — Telehealth: Payer: Self-pay | Admitting: *Deleted

## 2012-07-14 ENCOUNTER — Encounter: Payer: Self-pay | Admitting: *Deleted

## 2012-07-14 LAB — URINE CULTURE

## 2012-07-14 NOTE — Telephone Encounter (Signed)
She has red flags for narcotic mis-use in her chart documented; she will need to be seen by a provider before a new narcotic Rx can be issued.

## 2012-07-14 NOTE — Progress Notes (Unsigned)
Pt presents

## 2012-07-14 NOTE — Telephone Encounter (Signed)
Myriam Jacobson came and spoke to me re: this patient.  She was requested to go to the ED, as there were no appointments to be seen by an MD in the clinic today.  She declined to be taken to the ED when she spoke with [facility].  She has an appointment for next week in the clinic, she was encouraged to keep this appointment.

## 2012-07-14 NOTE — Telephone Encounter (Signed)
Pt presents c/o h/a, missed appt earlier in week, wen to ED, request to be seen and have script for pain med to day, no appts available, appt scheduled for mon 1/27 at 1530 dr sadek. While talking w/ pt and explaining that the probability of rec'ing pain med today is low she drops from chair onto her knees in floor, i opened the wh/ch and informed her that she would need to be seen in ED, she refuses, spoke w/ dr Burton Apley and pt is instructed to keep appt Monday and return to ED if her h/a does not improve, she states she must go to work this pm because her boyfriend expects her too, she has me call him to pick her up.

## 2012-07-17 ENCOUNTER — Ambulatory Visit (INDEPENDENT_AMBULATORY_CARE_PROVIDER_SITE_OTHER): Payer: No Typology Code available for payment source | Admitting: Internal Medicine

## 2012-07-17 VITALS — BP 110/68 | HR 70 | Temp 97.0°F | Wt 126.4 lb

## 2012-07-17 DIAGNOSIS — G43909 Migraine, unspecified, not intractable, without status migrainosus: Secondary | ICD-10-CM

## 2012-07-17 DIAGNOSIS — F450 Somatization disorder: Secondary | ICD-10-CM

## 2012-07-17 DIAGNOSIS — F329 Major depressive disorder, single episode, unspecified: Secondary | ICD-10-CM

## 2012-07-17 DIAGNOSIS — F459 Somatoform disorder, unspecified: Secondary | ICD-10-CM

## 2012-07-17 MED ORDER — KETOROLAC TROMETHAMINE 30 MG/ML IM SOLN: 30.0000 mg | Freq: Once | INTRAMUSCULAR | Status: DC

## 2012-07-17 MED ORDER — TRAMADOL HCL 50 MG PO TABS: 50.0000 mg | ORAL_TABLET | Freq: Four times a day (QID) | ORAL | Status: DC | PRN

## 2012-07-17 MED ORDER — QUETIAPINE FUMARATE ER 50 MG PO TB24: 50.0000 mg | ORAL_TABLET | Freq: Every day | ORAL | Status: DC

## 2012-07-17 MED ORDER — CLONAZEPAM 1 MG PO TABS: 1.0000 mg | ORAL_TABLET | Freq: Every day | ORAL | Status: DC

## 2012-07-17 MED ORDER — ZOLPIDEM TARTRATE 5 MG PO TABS: 5.0000 mg | ORAL_TABLET | Freq: Every evening | ORAL | Status: DC | PRN

## 2012-07-17 MED ORDER — KETOROLAC TROMETHAMINE 30 MG/ML IJ SOLN
30.0000 mg | Freq: Once | INTRAMUSCULAR | Status: AC
  Administered 2012-07-17: 30 mg via INTRAMUSCULAR
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
## 2012-07-17 NOTE — Patient Instructions (Signed)
It was nice to meet you today.   We gave you a shot for your migraine headache.  We have written prescriptions for your medicines from [facility] for one month and then will follow up in 1 month to discuss a contract to give you these medicine after that.   Just ask the front desk if you would like to become my patient: Dr. Burtis Junes.   If you have any other questions or problems just call the clinic: [phone]

## 2012-07-21 ENCOUNTER — Telehealth: Payer: Self-pay | Admitting: Licensed Clinical Social Worker

## 2012-07-21 NOTE — Telephone Encounter (Signed)
Pt requesting to meet with CSW.  CSW placed call to Ms. Bradly Bienenstock utilizing interpreter.  Pt scheduled with CSW for Tues, 2/4 at noon.

## 2012-07-26 ENCOUNTER — Encounter: Payer: Self-pay | Admitting: Licensed Clinical Social Worker
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
## 2012-08-18 ENCOUNTER — Ambulatory Visit (INDEPENDENT_AMBULATORY_CARE_PROVIDER_SITE_OTHER): Payer: No Typology Code available for payment source | Admitting: Internal Medicine

## 2012-08-18 ENCOUNTER — Encounter: Payer: Self-pay | Admitting: Internal Medicine

## 2012-08-18 VITALS — BP 113/73 | HR 64 | Temp 96.8°F | Ht 63.4 in | Wt 123.7 lb

## 2012-08-18 DIAGNOSIS — M5432 Sciatica, left side: Secondary | ICD-10-CM

## 2012-08-18 DIAGNOSIS — T7491XD Unspecified adult maltreatment, confirmed, subsequent encounter: Secondary | ICD-10-CM

## 2012-08-18 DIAGNOSIS — M5430 Sciatica, unspecified side: Secondary | ICD-10-CM

## 2012-08-18 DIAGNOSIS — F450 Somatization disorder: Secondary | ICD-10-CM

## 2012-08-18 DIAGNOSIS — Z5189 Encounter for other specified aftercare: Secondary | ICD-10-CM

## 2012-08-18 DIAGNOSIS — G894 Chronic pain syndrome: Secondary | ICD-10-CM

## 2012-08-18 DIAGNOSIS — F329 Major depressive disorder, single episode, unspecified: Secondary | ICD-10-CM

## 2012-08-18 MED ORDER — KETOROLAC TROMETHAMINE 30 MG/ML IJ SOLN
30.0000 mg | Freq: Once | INTRAMUSCULAR | Status: AC
  Administered 2012-08-18: 30 mg via INTRAMUSCULAR

## 2012-08-18 MED ORDER — ZOLPIDEM TARTRATE 5 MG PO TABS: 5.0000 mg | ORAL_TABLET | Freq: Every evening | ORAL | Status: DC | PRN

## 2012-08-18 MED ORDER — TRAMADOL HCL 50 MG PO TABS: 50.0000 mg | ORAL_TABLET | Freq: Four times a day (QID) | ORAL | Status: DC | PRN

## 2012-08-18 MED ORDER — QUETIAPINE FUMARATE ER 50 MG PO TB24: 50.0000 mg | ORAL_TABLET | Freq: Every day | ORAL | Status: DC

## 2012-08-18 MED ORDER — KETOROLAC TROMETHAMINE 30 MG/ML IM SOLN: 30.0000 mg | Freq: Once | INTRAMUSCULAR | Status: DC

## 2012-08-18 MED ORDER — CLONAZEPAM 1 MG PO TABS: 1.0000 mg | ORAL_TABLET | Freq: Every day | ORAL | Status: DC

## 2012-08-18 NOTE — Progress Notes (Signed)
Interpreter Wyvonnia Dusky for DR Burtis Junes
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
## 2012-08-22 ENCOUNTER — Encounter: Payer: Self-pay | Admitting: Licensed Clinical Social Worker

## 2012-08-22 NOTE — Progress Notes (Signed)
Patient ID: Karen [name], female   DOB: August 27, 1965, 47 y.o.   MRN: [id number] Pt completed GCCN/TAMS application.  Pt now has access to medical transportation to/from [facility] appt's.

## 2012-09-05 ENCOUNTER — Encounter: Payer: Self-pay | Admitting: *Deleted

## 2012-09-06 ENCOUNTER — Encounter: Payer: Self-pay | Admitting: Licensed Clinical Social Worker
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
## 2012-09-15 ENCOUNTER — Encounter: Payer: Self-pay | Admitting: Internal Medicine

## 2012-09-19 ENCOUNTER — Ambulatory Visit: Payer: No Typology Code available for payment source | Attending: Internal Medicine | Admitting: Physical Therapy

## 2012-09-19 DIAGNOSIS — R293 Abnormal posture: Secondary | ICD-10-CM | POA: Insufficient documentation

## 2012-09-19 DIAGNOSIS — IMO0001 Reserved for inherently not codable concepts without codable children: Secondary | ICD-10-CM | POA: Insufficient documentation

## 2012-09-19 DIAGNOSIS — M546 Pain in thoracic spine: Secondary | ICD-10-CM | POA: Insufficient documentation

## 2012-09-19 DIAGNOSIS — M5430 Sciatica, unspecified side: Secondary | ICD-10-CM | POA: Insufficient documentation

## 2012-09-25 ENCOUNTER — Ambulatory Visit: Payer: No Typology Code available for payment source | Admitting: Physical Therapy

## 2012-09-28 ENCOUNTER — Ambulatory Visit: Payer: No Typology Code available for payment source | Admitting: Physical Therapy

## 2012-10-02 ENCOUNTER — Ambulatory Visit: Payer: No Typology Code available for payment source | Admitting: Physical Therapy

## 2012-10-05 ENCOUNTER — Ambulatory Visit: Payer: No Typology Code available for payment source | Admitting: Physical Therapy

## 2012-10-16 ENCOUNTER — Ambulatory Visit: Payer: No Typology Code available for payment source | Admitting: Physical Therapy

## 2012-10-16 ENCOUNTER — Encounter (HOSPITAL_COMMUNITY): Payer: Self-pay | Admitting: *Deleted

## 2012-10-16 DIAGNOSIS — F411 Generalized anxiety disorder: Secondary | ICD-10-CM | POA: Insufficient documentation

## 2012-10-16 DIAGNOSIS — F3289 Other specified depressive episodes: Secondary | ICD-10-CM | POA: Insufficient documentation

## 2012-10-16 DIAGNOSIS — R112 Nausea with vomiting, unspecified: Secondary | ICD-10-CM | POA: Insufficient documentation

## 2012-10-16 DIAGNOSIS — R42 Dizziness and giddiness: Secondary | ICD-10-CM | POA: Insufficient documentation

## 2012-10-16 DIAGNOSIS — G8929 Other chronic pain: Secondary | ICD-10-CM | POA: Insufficient documentation

## 2012-10-16 DIAGNOSIS — G43909 Migraine, unspecified, not intractable, without status migrainosus: Secondary | ICD-10-CM | POA: Insufficient documentation

## 2012-10-16 DIAGNOSIS — R109 Unspecified abdominal pain: Secondary | ICD-10-CM | POA: Insufficient documentation

## 2012-10-16 DIAGNOSIS — R079 Chest pain, unspecified: Secondary | ICD-10-CM | POA: Insufficient documentation

## 2012-10-16 DIAGNOSIS — M549 Dorsalgia, unspecified: Secondary | ICD-10-CM | POA: Insufficient documentation

## 2012-10-16 DIAGNOSIS — F329 Major depressive disorder, single episode, unspecified: Secondary | ICD-10-CM | POA: Insufficient documentation

## 2012-10-16 DIAGNOSIS — Z79899 Other long term (current) drug therapy: Secondary | ICD-10-CM | POA: Insufficient documentation

## 2012-10-16 DIAGNOSIS — Z8719 Personal history of other diseases of the digestive system: Secondary | ICD-10-CM | POA: Insufficient documentation

## 2012-10-16 NOTE — ED Notes (Signed)
No facial droop, no arm drift, grip strength equal.  C/o some difficulty speaking and dizziness since Saturday.

## 2012-10-16 NOTE — ED Notes (Signed)
Pt c/o HA, nausea, dizziness since Saturday.  Also c/o abdominal pain and n/v.

## 2012-10-16 NOTE — ED Notes (Signed)
Patient transported to CT 

## 2012-10-17 ENCOUNTER — Emergency Department (HOSPITAL_COMMUNITY)
Admission: EM | Admit: 2012-10-17 | Discharge: 2012-10-17 | Disposition: A | Discharge status: Home / Self Care | Payer: No Typology Code available for payment source | Attending: Emergency Medicine | Admitting: Emergency Medicine

## 2012-10-17 ENCOUNTER — Emergency Department (HOSPITAL_COMMUNITY): Payer: No Typology Code available for payment source

## 2012-10-17 DIAGNOSIS — G43909 Migraine, unspecified, not intractable, without status migrainosus: Secondary | ICD-10-CM

## 2012-10-17 LAB — URINALYSIS, ROUTINE W REFLEX MICROSCOPIC
Bilirubin Urine: NEGATIVE
Hgb urine dipstick: NEGATIVE
Protein, ur: NEGATIVE mg/dL
Specific Gravity, Urine: 1.023 (ref 1.005–1.030)
Urobilinogen, UA: 1 mg/dL (ref 0.0–1.0)

## 2012-10-17 LAB — COMPREHENSIVE METABOLIC PANEL
ALT: 15 U/L (ref 0–35)
Alkaline Phosphatase: 50 U/L (ref 39–117)
CO2: 30 mEq/L (ref 19–32)
Chloride: 100 mEq/L (ref 96–112)
GFR calc Af Amer: 89 mL/min — ABNORMAL LOW (ref >90)
GFR calc non Af Amer: 77 mL/min — ABNORMAL LOW (ref >90)
Glucose, Bld: 101 mg/dL — ABNORMAL HIGH (ref 70–99)
Potassium: 4.7 mEq/L (ref 3.5–5.1)
Sodium: 138 mEq/L (ref 135–145)

## 2012-10-17 LAB — DIFFERENTIAL
Lymphocytes Relative: 25 % (ref 12–46)
Lymphs Abs: 2.5 10*3/uL (ref 0.7–4.0)
Neutrophils Relative %: 68 % (ref 43–77)

## 2012-10-17 LAB — PROTIME-INR
INR: 0.93 (ref 0.00–1.49)
Prothrombin Time: 12.4 seconds (ref 11.6–15.2)

## 2012-10-17 LAB — POCT I-STAT TROPONIN I

## 2012-10-17 LAB — CBC
Platelets: 395 10*3/uL (ref 150–400)
RBC: 4.11 MIL/uL (ref 3.87–5.11)
WBC: 9.8 10*3/uL (ref 4.0–10.5)

## 2012-10-17 MED ORDER — SODIUM CHLORIDE 0.9 % IV BOLUS (SEPSIS)
1000.0000 mL | Freq: Once | INTRAVENOUS | Status: AC
  Administered 2012-10-17: 1000 mL via INTRAVENOUS

## 2012-10-17 MED ORDER — KETOROLAC TROMETHAMINE 30 MG/ML IJ SOLN
30.0000 mg | Freq: Once | INTRAMUSCULAR | Status: AC
  Administered 2012-10-17: 30 mg via INTRAVENOUS
  Filled 2012-10-17: qty 1

## 2012-10-17 MED ORDER — METOCLOPRAMIDE HCL 5 MG/ML IJ SOLN
10.0000 mg | Freq: Once | INTRAMUSCULAR | Status: AC
  Administered 2012-10-17: 10 mg via INTRAVENOUS
  Filled 2012-10-17: qty 2

## 2012-10-17 NOTE — ED Notes (Signed)
Pt also c/o CP
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
## 2012-10-17 NOTE — ED Notes (Signed)
NURSE FIRST ROUNDS : PT. SITTING AT TRIAGE1 ROOM IN STABLE CONDITION , RESPIRATIONS UNLABORED , NURSE EXPLAINED DELAY AND PROCESS.

## 2012-10-18 ENCOUNTER — Ambulatory Visit: Payer: No Typology Code available for payment source | Admitting: Physical Therapy

## 2012-10-19 NOTE — Assessment & Plan Note (Signed)
Refills today for her medications as a bridge until she can be seen at [facility] again.

## 2012-10-19 NOTE — Assessment & Plan Note (Signed)
She has been struggling with insomnia.  She has several medications that she uses and I encouraged her to take them as directed and to discuss her sleep with her psychiatrist at [facility].  She is in need of refills today and I will give her them as a bridge until she can be seen at [facility].

## 2012-10-19 NOTE — Assessment & Plan Note (Addendum)
Lab Results  Component Value Date   CHOL 192 09/21/2011   CHOL 295* 03/25/2011   CHOL 260* 09/11/2010   Lab Results  Component Value Date   HDL 49 09/21/2011   HDL 45 03/25/2011   HDL 38* 09/11/2010   Lab Results  Component Value Date   LDLCALC 112* 09/21/2011   LDLCALC Comment:   Not calculated due to Triglyceride >400.  03/25/2011   LDLCALC Comment:   Not calculated due to Triglyceride >400.  09/11/2010   Lab Results  Component Value Date   TRIG 157* 09/21/2011   TRIG 583* 03/25/2011   TRIG 727* 09/11/2010   Lab Results  Component Value Date   CHOLHDL 3.9 09/21/2011   CHOLHDL 6.6 03/25/2011   CHOLHDL 6.8 09/11/2010   No results found for this basename: LDLDIRECT   Her last lipids were within her goal.  We will continue her Tricor for now.

## 2012-10-19 NOTE — Assessment & Plan Note (Signed)
The patient's back pain is consistent with lumbar muscular spasm.  We will treat with Flexeril, ice, heat, and get her referred back to PT for massage and electrostimulation.  She was encouraged to continue the stretching and strengthening exercises that she was given before.

## 2012-10-19 NOTE — Assessment & Plan Note (Signed)
She is on a long term benzodiazapine therapy and we will refill that today until she can get back to [facility].

## 2012-10-19 NOTE — Assessment & Plan Note (Signed)
>>  ASSESSMENT AND PLAN FOR HYPERTRIGLYCERIDEMIA WRITTEN ON 10/19/2012  2:06 PM BY Leodis Sias, MD  Lab Results  Component Value Date   CHOL 192 09/21/2011   CHOL 295* 03/25/2011   CHOL 260* 09/11/2010   Lab Results  Component Value Date   HDL 49 09/21/2011   HDL 45 03/25/2011   HDL 38* 09/11/2010   Lab Results  Component Value Date   LDLCALC 112* 09/21/2011   LDLCALC Comment:   Not calculated due to Triglyceride >400.  03/25/2011   LDLCALC Comment:   Not calculated due to Triglyceride >400.  09/11/2010   Lab Results  Component Value Date   TRIG 157* 09/21/2011   TRIG 583* 03/25/2011   TRIG 727* 09/11/2010   Lab Results  Component Value Date   CHOLHDL 3.9 09/21/2011   CHOLHDL 6.6 03/25/2011   CHOLHDL 6.8 09/11/2010   No results found for this basename: LDLDIRECT   Her last lipids were within her goal.  We will continue her Tricor for now.

## 2012-11-03 ENCOUNTER — Encounter: Payer: Self-pay | Admitting: Internal Medicine

## 2012-11-17 ENCOUNTER — Encounter: Payer: Self-pay | Admitting: Internal Medicine

## 2012-11-17 ENCOUNTER — Ambulatory Visit (INDEPENDENT_AMBULATORY_CARE_PROVIDER_SITE_OTHER): Payer: Self-pay | Admitting: Internal Medicine

## 2012-11-17 ENCOUNTER — Ambulatory Visit: Payer: Self-pay

## 2012-11-17 VITALS — BP 128/77 | HR 78 | Temp 97.9°F | Ht 62.0 in | Wt 117.1 lb

## 2012-11-17 DIAGNOSIS — G43909 Migraine, unspecified, not intractable, without status migrainosus: Secondary | ICD-10-CM

## 2012-11-17 DIAGNOSIS — F332 Major depressive disorder, recurrent severe without psychotic features: Secondary | ICD-10-CM

## 2012-11-17 MED ORDER — TRAMADOL HCL 50 MG PO TABS: 50.0000 mg | ORAL_TABLET | Freq: Four times a day (QID) | ORAL | Status: DC | PRN

## 2012-11-17 MED ORDER — ONDANSETRON HCL 4 MG PO TABS: 4.0000 mg | ORAL_TABLET | Freq: Every day | ORAL | Status: DC | PRN

## 2012-11-17 MED ORDER — KETOROLAC TROMETHAMINE 15 MG/ML IJ SOLN
15.0000 mg | Freq: Once | INTRAMUSCULAR | Status: AC
  Administered 2012-11-17: 15 mg via INTRAMUSCULAR
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
## 2012-11-21 ENCOUNTER — Ambulatory Visit: Payer: Self-pay

## 2012-11-21 NOTE — Progress Notes (Signed)
Case discussed with Dr. Nora Sadek at the time of the visit, immediately after the resident saw the patient.  I reviewed the resident's history and exam and pertinent patient test results.  I agree with the assessment, diagnosis and plan of care documented in the resident's note.     

## 2013-01-03 ENCOUNTER — Other Ambulatory Visit: Payer: Self-pay | Admitting: *Deleted

## 2013-01-03 MED ORDER — CLONAZEPAM 1 MG PO TABS: 1.0000 mg | ORAL_TABLET | Freq: Every day | ORAL | Status: DC

## 2013-01-03 MED ORDER — ZOLPIDEM TARTRATE 5 MG PO TABS: 5.0000 mg | ORAL_TABLET | Freq: Every evening | ORAL | Status: DC | PRN

## 2013-01-04 NOTE — Telephone Encounter (Signed)
Rx called in 

## 2013-01-10 IMAGING — CR DG CHEST 2V
2 series · 2 of 2 positions shown · non-contrast
Comparison: 09/09/2011

CLINICAL DATA: Positive PPD.

CHEST - 2 VIEW

[w chest pa]
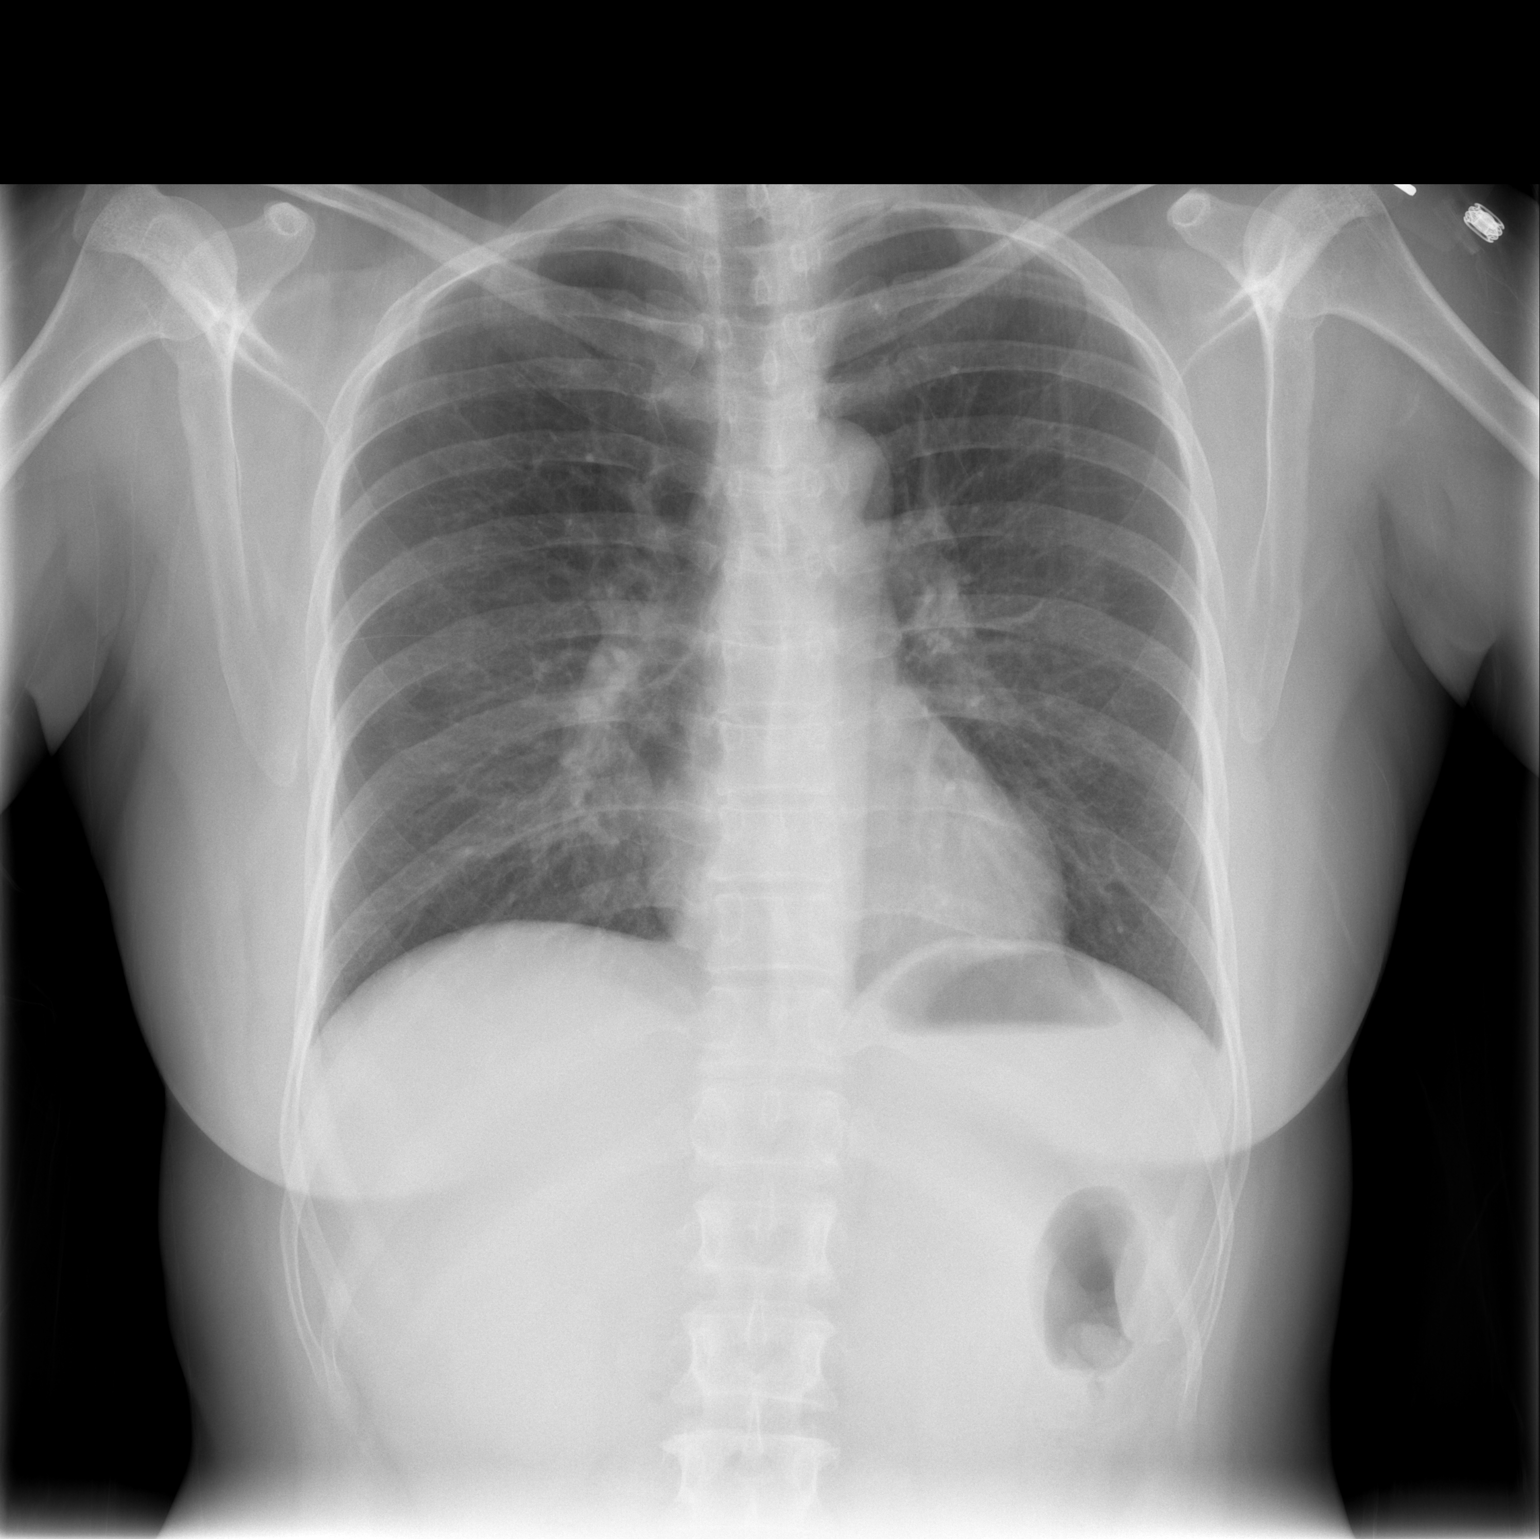

[w chest lat]
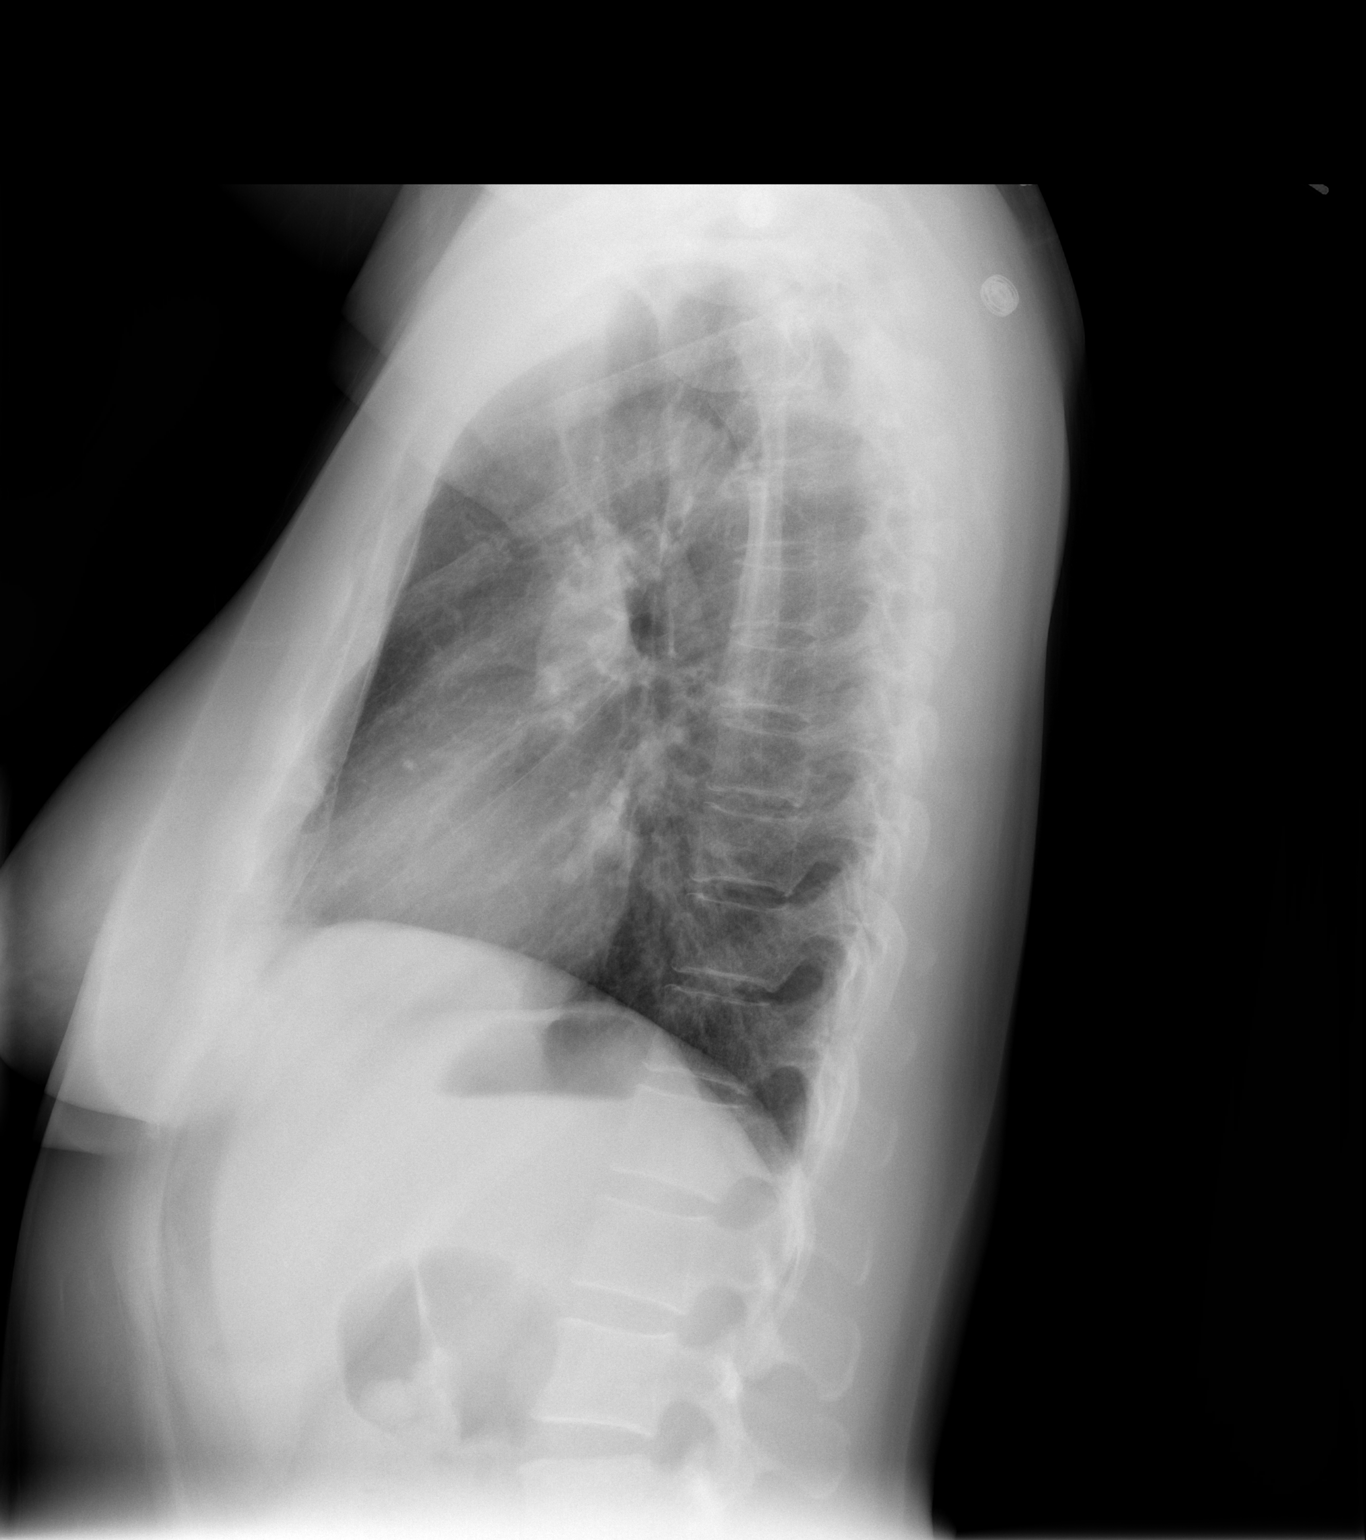

[2 of 2 positions shown; findings below may reference images not displayed]

FINDINGS: Heart and mediastinal contours are within normal limits.
No focal opacities or effusions.  No acute bony abnormality.
IMPRESSION: No active cardiopulmonary disease.

## 2013-01-24 ENCOUNTER — Emergency Department (HOSPITAL_COMMUNITY): Payer: No Typology Code available for payment source

## 2013-01-24 ENCOUNTER — Encounter (HOSPITAL_COMMUNITY): Payer: Self-pay | Admitting: Emergency Medicine

## 2013-01-24 DIAGNOSIS — Z8719 Personal history of other diseases of the digestive system: Secondary | ICD-10-CM | POA: Insufficient documentation

## 2013-01-24 DIAGNOSIS — I10 Essential (primary) hypertension: Secondary | ICD-10-CM | POA: Insufficient documentation

## 2013-01-24 DIAGNOSIS — R110 Nausea: Secondary | ICD-10-CM | POA: Insufficient documentation

## 2013-01-24 DIAGNOSIS — Z79899 Other long term (current) drug therapy: Secondary | ICD-10-CM | POA: Insufficient documentation

## 2013-01-24 DIAGNOSIS — Z8739 Personal history of other diseases of the musculoskeletal system and connective tissue: Secondary | ICD-10-CM | POA: Insufficient documentation

## 2013-01-24 DIAGNOSIS — F329 Major depressive disorder, single episode, unspecified: Secondary | ICD-10-CM | POA: Insufficient documentation

## 2013-01-24 DIAGNOSIS — F411 Generalized anxiety disorder: Secondary | ICD-10-CM | POA: Insufficient documentation

## 2013-01-24 DIAGNOSIS — R51 Headache: Secondary | ICD-10-CM | POA: Insufficient documentation

## 2013-01-24 DIAGNOSIS — H538 Other visual disturbances: Secondary | ICD-10-CM | POA: Insufficient documentation

## 2013-01-24 DIAGNOSIS — IMO0002 Reserved for concepts with insufficient information to code with codable children: Secondary | ICD-10-CM | POA: Insufficient documentation

## 2013-01-24 DIAGNOSIS — F3289 Other specified depressive episodes: Secondary | ICD-10-CM | POA: Insufficient documentation

## 2013-01-24 DIAGNOSIS — R1013 Epigastric pain: Secondary | ICD-10-CM | POA: Insufficient documentation

## 2013-01-24 DIAGNOSIS — R42 Dizziness and giddiness: Secondary | ICD-10-CM | POA: Insufficient documentation

## 2013-01-24 LAB — BASIC METABOLIC PANEL
BUN: 19 mg/dL (ref 6–23)
GFR calc Af Amer: 88 mL/min — ABNORMAL LOW (ref >90)
GFR calc non Af Amer: 76 mL/min — ABNORMAL LOW (ref >90)
Potassium: 3.6 mEq/L (ref 3.5–5.1)
Sodium: 139 mEq/L (ref 135–145)

## 2013-01-24 LAB — POCT I-STAT TROPONIN I

## 2013-01-24 LAB — CBC
HCT: 38.8 % (ref 36.0–46.0)
MCHC: 34.5 g/dL (ref 30.0–36.0)
Platelets: 461 10*3/uL — ABNORMAL HIGH (ref 150–400)
RDW: 13.3 % (ref 11.5–15.5)
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
## 2013-01-25 ENCOUNTER — Emergency Department (HOSPITAL_COMMUNITY)
Admission: EM | Admit: 2013-01-25 | Discharge: 2013-01-25 | Disposition: A | Discharge status: Home / Self Care | Payer: No Typology Code available for payment source | Attending: Emergency Medicine | Admitting: Emergency Medicine

## 2013-01-25 DIAGNOSIS — R1013 Epigastric pain: Secondary | ICD-10-CM

## 2013-01-25 DIAGNOSIS — R079 Chest pain, unspecified: Secondary | ICD-10-CM

## 2013-01-25 DIAGNOSIS — R519 Headache, unspecified: Secondary | ICD-10-CM

## 2013-01-25 MED ORDER — GI COCKTAIL ~~LOC~~
30.0000 mL | Freq: Once | ORAL | Status: AC
  Administered 2013-01-25: 30 mL via ORAL
  Filled 2013-01-25: qty 30

## 2013-01-25 MED ORDER — METOCLOPRAMIDE HCL 5 MG/ML IJ SOLN
10.0000 mg | Freq: Once | INTRAMUSCULAR | Status: AC
  Administered 2013-01-25: 10 mg via INTRAVENOUS
  Filled 2013-01-25: qty 2

## 2013-01-25 MED ORDER — FAMOTIDINE IN NACL 20-0.9 MG/50ML-% IV SOLN
20.0000 mg | Freq: Once | INTRAVENOUS | Status: AC
  Administered 2013-01-25: 20 mg via INTRAVENOUS
  Filled 2013-01-25: qty 50

## 2013-01-25 MED ORDER — SODIUM CHLORIDE 0.9 % IV BOLUS (SEPSIS)
1000.0000 mL | Freq: Once | INTRAVENOUS | Status: AC
  Administered 2013-01-25: 1000 mL via INTRAVENOUS

## 2013-01-25 MED ORDER — SUCRALFATE 1 G PO TABS: 1.0000 g | ORAL_TABLET | Freq: Four times a day (QID) | ORAL | Status: DC

## 2013-01-25 MED ORDER — PANTOPRAZOLE SODIUM 40 MG IV SOLR
40.0000 mg | Freq: Once | INTRAVENOUS | Status: AC
  Administered 2013-01-25: 40 mg via INTRAVENOUS
  Filled 2013-01-25: qty 40

## 2013-01-25 MED ORDER — OMEPRAZOLE 20 MG PO CPDR: 20.0000 mg | DELAYED_RELEASE_CAPSULE | Freq: Every day | ORAL | Status: DC

## 2013-01-25 MED ORDER — ASPIRIN 81 MG PO CHEW
324.0000 mg | CHEWABLE_TABLET | Freq: Once | ORAL | Status: AC
  Administered 2013-01-25: 324 mg via ORAL
  Filled 2013-01-25: qty 4

## 2013-01-25 NOTE — ED Provider Notes (Signed)
Medical screening examination/treatment/procedure(s) were performed by non-physician practitioner and as supervising physician I was immediately available for consultation/collaboration.  Shizuo Biskup M Harvy Riera, MD 01/25/13 2049 
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
## 2013-01-25 NOTE — ED Notes (Signed)
Patient complains of headache 10/10, abdominal pain 10/10 and chest pain 10/10 at this time.

## 2013-01-26 ENCOUNTER — Encounter: Payer: Self-pay | Admitting: Internal Medicine

## 2013-01-29 ENCOUNTER — Other Ambulatory Visit: Payer: Self-pay | Admitting: *Deleted

## 2013-02-01 NOTE — Telephone Encounter (Signed)
It is unclear to me whether patient is intended to still be on sertraline; it appears that it was stopped last fall during a [facility] admission.  Patient has appointment tomorrow with her PCP, and this should be sorted out then.

## 2013-02-02 ENCOUNTER — Ambulatory Visit (INDEPENDENT_AMBULATORY_CARE_PROVIDER_SITE_OTHER): Payer: No Typology Code available for payment source | Admitting: Internal Medicine

## 2013-02-02 ENCOUNTER — Encounter: Payer: Self-pay | Admitting: Internal Medicine

## 2013-02-02 VITALS — BP 96/62 | HR 60 | Temp 97.4°F | Ht 62.0 in | Wt 120.4 lb

## 2013-02-02 DIAGNOSIS — F339 Major depressive disorder, recurrent, unspecified: Secondary | ICD-10-CM

## 2013-02-02 DIAGNOSIS — G894 Chronic pain syndrome: Secondary | ICD-10-CM

## 2013-02-02 MED ORDER — KETOROLAC TROMETHAMINE 30 MG/ML IM SOLN: 30.0000 mg | Freq: Once | INTRAMUSCULAR | Status: DC

## 2013-02-02 MED ORDER — OMEPRAZOLE 20 MG PO CPDR: 20.0000 mg | DELAYED_RELEASE_CAPSULE | Freq: Every day | ORAL | Status: DC
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
## 2013-02-05 ENCOUNTER — Telehealth: Payer: Self-pay | Admitting: *Deleted

## 2013-02-05 MED ORDER — KETOROLAC TROMETHAMINE 30 MG/ML IJ SOLN
30.0000 mg | Freq: Once | INTRAMUSCULAR | Status: AC
  Administered 2013-02-02: 30 mg via INTRAMUSCULAR

## 2013-02-05 NOTE — Telephone Encounter (Signed)
Pharm states they can obtain nexium free of chg to pt, would you consider changing?

## 2013-02-05 NOTE — Addendum Note (Signed)
Addended by: Alinda Sierras on: 02/05/2013 08:58 AM   Modules accepted: Orders

## 2013-02-06 MED ORDER — ESOMEPRAZOLE MAGNESIUM 20 MG PO CPDR: 20.0000 mg | DELAYED_RELEASE_CAPSULE | Freq: Every day | ORAL | Status: DC

## 2013-02-10 ENCOUNTER — Encounter (HOSPITAL_COMMUNITY): Payer: Self-pay | Admitting: Emergency Medicine

## 2013-02-10 ENCOUNTER — Emergency Department (HOSPITAL_COMMUNITY)
Admission: EM | Admit: 2013-02-10 | Discharge: 2013-02-11 | Disposition: A | Discharge status: Other Acute Inpatient Hospital | Payer: No Typology Code available for payment source | Attending: Emergency Medicine | Admitting: Emergency Medicine

## 2013-02-10 DIAGNOSIS — F411 Generalized anxiety disorder: Secondary | ICD-10-CM | POA: Insufficient documentation

## 2013-02-10 DIAGNOSIS — G43909 Migraine, unspecified, not intractable, without status migrainosus: Secondary | ICD-10-CM | POA: Insufficient documentation

## 2013-02-10 DIAGNOSIS — G4700 Insomnia, unspecified: Secondary | ICD-10-CM

## 2013-02-10 DIAGNOSIS — F339 Major depressive disorder, recurrent, unspecified: Secondary | ICD-10-CM

## 2013-02-10 DIAGNOSIS — I10 Essential (primary) hypertension: Secondary | ICD-10-CM | POA: Insufficient documentation

## 2013-02-10 DIAGNOSIS — F3289 Other specified depressive episodes: Secondary | ICD-10-CM | POA: Insufficient documentation

## 2013-02-10 DIAGNOSIS — IMO0002 Reserved for concepts with insufficient information to code with codable children: Secondary | ICD-10-CM | POA: Insufficient documentation

## 2013-02-10 DIAGNOSIS — T43502A Poisoning by unspecified antipsychotics and neuroleptics, intentional self-harm, initial encounter: Secondary | ICD-10-CM | POA: Insufficient documentation

## 2013-02-10 DIAGNOSIS — M549 Dorsalgia, unspecified: Secondary | ICD-10-CM | POA: Insufficient documentation

## 2013-02-10 DIAGNOSIS — T43224A Poisoning by selective serotonin reuptake inhibitors, undetermined, initial encounter: Secondary | ICD-10-CM | POA: Insufficient documentation

## 2013-02-10 DIAGNOSIS — T1491XA Suicide attempt, initial encounter: Secondary | ICD-10-CM

## 2013-02-10 DIAGNOSIS — Z79899 Other long term (current) drug therapy: Secondary | ICD-10-CM | POA: Insufficient documentation

## 2013-02-10 DIAGNOSIS — G8929 Other chronic pain: Secondary | ICD-10-CM | POA: Insufficient documentation

## 2013-02-10 DIAGNOSIS — Z8679 Personal history of other diseases of the circulatory system: Secondary | ICD-10-CM | POA: Insufficient documentation

## 2013-02-10 DIAGNOSIS — F329 Major depressive disorder, single episode, unspecified: Secondary | ICD-10-CM | POA: Insufficient documentation

## 2013-02-10 DIAGNOSIS — Z8719 Personal history of other diseases of the digestive system: Secondary | ICD-10-CM | POA: Insufficient documentation

## 2013-02-10 LAB — RAPID URINE DRUG SCREEN, HOSP PERFORMED
Amphetamines: NOT DETECTED
Barbiturates: NOT DETECTED
Benzodiazepines: POSITIVE — AB
Cocaine: NOT DETECTED
Opiates: NOT DETECTED
Tetrahydrocannabinol: NOT DETECTED

## 2013-02-10 LAB — CBC WITH DIFFERENTIAL/PLATELET
Basophils Absolute: 0 10*3/uL (ref 0.0–0.1)
Basophils Relative: 0 % (ref 0–1)
Eosinophils Absolute: 0.1 10*3/uL (ref 0.0–0.7)
Eosinophils Relative: 1 % (ref 0–5)
HCT: 38.4 % (ref 36.0–46.0)
Hemoglobin: 12.8 g/dL (ref 12.0–15.0)
Lymphocytes Relative: 19 % (ref 12–46)
Lymphs Abs: 2 10*3/uL (ref 0.7–4.0)
MCH: 31.1 pg (ref 26.0–34.0)
MCHC: 33.3 g/dL (ref 30.0–36.0)
MCV: 93.2 fL (ref 78.0–100.0)
Monocytes Absolute: 0.8 10*3/uL (ref 0.1–1.0)
Monocytes Relative: 8 % (ref 3–12)
Neutro Abs: 7.5 10*3/uL (ref 1.7–7.7)
Neutrophils Relative %: 73 % (ref 43–77)
Platelets: 387 10*3/uL (ref 150–400)
RBC: 4.12 MIL/uL (ref 3.87–5.11)
RDW: 12.7 % (ref 11.5–15.5)
WBC: 10.3 10*3/uL (ref 4.0–10.5)

## 2013-02-10 NOTE — ED Notes (Signed)
EMS reports that pt was found at home approx 25 minutes ago. Pt stated that she ingested an entire bottle of 60 count 100mg  Zoloft within 10 mins with water. Denies any ETOH. Pt reports an attempt to take her own life. Pt has a hx of depression and hyperlipidemia. Pt has history of suicidal attempt a year ago in the same fashion. Pt is conscious, A&O. Pt is ambulatory. Pt is rating abdominal pain 10/10. Abdomen is soft, no fluid waves, no palpable masses.

## 2013-02-10 NOTE — ED Notes (Signed)
Patient medications counted and sent to pharmacy with Madalyn Rob

## 2013-02-10 NOTE — ED Notes (Signed)
Patient requested we call her boyfriend, Merleen Nicely, [phone]. No answer. Message left with my name and return contact phone number, no patient identifiers were provided. Patient updated.

## 2013-02-10 NOTE — ED Notes (Signed)
Patient with clear-white colored emesis. Patient jewelry removed and placed in a denture cup to be secured with additional patient belongings after being checked by security.

## 2013-02-11 ENCOUNTER — Inpatient Hospital Stay (HOSPITAL_COMMUNITY)
Admission: AD | Admit: 2013-02-11 | Discharge: 2013-02-14 | DRG: 885 | Disposition: A | Discharge status: Home / Self Care | Payer: No Typology Code available for payment source | Source: Intra-hospital | Attending: Psychiatry | Admitting: Psychiatry

## 2013-02-11 ENCOUNTER — Encounter (HOSPITAL_COMMUNITY): Payer: Self-pay | Admitting: *Deleted

## 2013-02-11 ENCOUNTER — Encounter (HOSPITAL_COMMUNITY): Payer: Self-pay | Admitting: Registered Nurse

## 2013-02-11 DIAGNOSIS — K279 Peptic ulcer, site unspecified, unspecified as acute or chronic, without hemorrhage or perforation: Secondary | ICD-10-CM

## 2013-02-11 DIAGNOSIS — N7011 Chronic salpingitis: Secondary | ICD-10-CM

## 2013-02-11 DIAGNOSIS — Z79899 Other long term (current) drug therapy: Secondary | ICD-10-CM

## 2013-02-11 DIAGNOSIS — F419 Anxiety disorder, unspecified: Secondary | ICD-10-CM | POA: Diagnosis present

## 2013-02-11 DIAGNOSIS — T7411XA Adult physical abuse, confirmed, initial encounter: Secondary | ICD-10-CM

## 2013-02-11 DIAGNOSIS — F339 Major depressive disorder, recurrent, unspecified: Secondary | ICD-10-CM | POA: Diagnosis present

## 2013-02-11 DIAGNOSIS — R109 Unspecified abdominal pain: Secondary | ICD-10-CM | POA: Diagnosis present

## 2013-02-11 DIAGNOSIS — M549 Dorsalgia, unspecified: Secondary | ICD-10-CM | POA: Diagnosis present

## 2013-02-11 DIAGNOSIS — F332 Major depressive disorder, recurrent severe without psychotic features: Secondary | ICD-10-CM

## 2013-02-11 DIAGNOSIS — G8929 Other chronic pain: Secondary | ICD-10-CM | POA: Diagnosis present

## 2013-02-11 DIAGNOSIS — J45909 Unspecified asthma, uncomplicated: Secondary | ICD-10-CM

## 2013-02-11 DIAGNOSIS — G43909 Migraine, unspecified, not intractable, without status migrainosus: Secondary | ICD-10-CM

## 2013-02-11 DIAGNOSIS — Z227 Latent tuberculosis: Secondary | ICD-10-CM

## 2013-02-11 DIAGNOSIS — G894 Chronic pain syndrome: Secondary | ICD-10-CM

## 2013-02-11 DIAGNOSIS — I10 Essential (primary) hypertension: Secondary | ICD-10-CM | POA: Diagnosis present

## 2013-02-11 DIAGNOSIS — F411 Generalized anxiety disorder: Secondary | ICD-10-CM | POA: Diagnosis present

## 2013-02-11 DIAGNOSIS — M7918 Myalgia, other site: Secondary | ICD-10-CM

## 2013-02-11 DIAGNOSIS — E781 Pure hyperglyceridemia: Secondary | ICD-10-CM

## 2013-02-11 DIAGNOSIS — G4700 Insomnia, unspecified: Secondary | ICD-10-CM | POA: Diagnosis present

## 2013-02-11 LAB — COMPREHENSIVE METABOLIC PANEL
ALT: 11 U/L (ref 0–35)
AST: 18 U/L (ref 0–37)
Albumin: 4.3 g/dL (ref 3.5–5.2)
Alkaline Phosphatase: 37 U/L — ABNORMAL LOW (ref 39–117)
BUN: 16 mg/dL (ref 6–23)
CO2: 26 mEq/L (ref 19–32)
Calcium: 9.8 mg/dL (ref 8.4–10.5)
Chloride: 100 mEq/L (ref 96–112)
Creatinine, Ser: 0.84 mg/dL (ref 0.50–1.10)
GFR calc Af Amer: >90 mL/min (ref >90)
GFR calc non Af Amer: 81 mL/min — ABNORMAL LOW (ref >90)
Glucose, Bld: 104 mg/dL — ABNORMAL HIGH (ref 70–99)
Potassium: 3.9 mEq/L (ref 3.5–5.1)
Sodium: 137 mEq/L (ref 135–145)
Total Bilirubin: 0.4 mg/dL (ref 0.3–1.2)
Total Protein: 7.4 g/dL (ref 6.0–8.3)

## 2013-02-11 LAB — ACETAMINOPHEN LEVEL: Acetaminophen (Tylenol), Serum: <15 ug/mL (ref 10–30)

## 2013-02-11 LAB — SALICYLATE LEVEL: Salicylate Lvl: <2 mg/dL — ABNORMAL LOW (ref 2.8–20.0)

## 2013-02-11 LAB — ETHANOL: Alcohol, Ethyl (B): <11 mg/dL (ref 0–11)

## 2013-02-11 LAB — MAGNESIUM: Magnesium: 2.1 mg/dL (ref 1.5–2.5)

## 2013-02-11 MED ORDER — SUCRALFATE 1 G PO TABS
1.0000 g | ORAL_TABLET | Freq: Three times a day (TID) | ORAL | Status: DC
  Filled 2013-02-11 (×3): qty 1

## 2013-02-11 MED ORDER — FENOFIBRATE 145 MG PO TABS
145.0000 mg | ORAL_TABLET | Freq: Every day | ORAL | Status: DC
  Filled 2013-02-11: qty 1

## 2013-02-11 MED ORDER — LORAZEPAM 1 MG PO TABS
1.0000 mg | ORAL_TABLET | Freq: Three times a day (TID) | ORAL | Status: DC | PRN
  Administered 2013-02-11: 1 mg via ORAL
  Filled 2013-02-11: qty 1

## 2013-02-11 MED ORDER — SERTRALINE HCL 50 MG PO TABS: 100.0000 mg | ORAL_TABLET | Freq: Two times a day (BID) | ORAL | Status: DC

## 2013-02-11 MED ORDER — TRAZODONE HCL 50 MG PO TABS
50.0000 mg | ORAL_TABLET | Freq: Every evening | ORAL | Status: DC | PRN
  Administered 2013-02-11: 50 mg via ORAL
  Filled 2013-02-11 (×2): qty 1

## 2013-02-11 MED ORDER — PANTOPRAZOLE SODIUM 40 MG PO TBEC
40.0000 mg | DELAYED_RELEASE_TABLET | Freq: Every day | ORAL | Status: DC
  Administered 2013-02-11: 40 mg via ORAL
  Filled 2013-02-11: qty 1

## 2013-02-11 MED ORDER — QUETIAPINE FUMARATE ER 50 MG PO TB24
50.0000 mg | ORAL_TABLET | Freq: Every day | ORAL | Status: DC
  Filled 2013-02-11: qty 1

## 2013-02-11 MED ORDER — LORAZEPAM 1 MG PO TABS
1.0000 mg | ORAL_TABLET | Freq: Once | ORAL | Status: AC
  Administered 2013-02-11: 1 mg via ORAL
  Filled 2013-02-11: qty 1

## 2013-02-11 MED ORDER — SUCRALFATE 1 G PO TABS
1.0000 g | ORAL_TABLET | Freq: Three times a day (TID) | ORAL | Status: DC
  Administered 2013-02-11 – 2013-02-14 (×11): 1 g via ORAL
  Filled 2013-02-11 (×19): qty 1

## 2013-02-11 MED ORDER — MAGNESIUM HYDROXIDE 400 MG/5ML PO SUSP: 30.0000 mL | Freq: Every day | ORAL | Status: DC | PRN

## 2013-02-11 MED ORDER — SERTRALINE HCL 100 MG PO TABS
100.0000 mg | ORAL_TABLET | Freq: Two times a day (BID) | ORAL | Status: DC
  Administered 2013-02-11 – 2013-02-12 (×2): 100 mg via ORAL
  Filled 2013-02-11 (×5): qty 1

## 2013-02-11 MED ORDER — ONDANSETRON HCL 4 MG/2ML IJ SOLN
4.0000 mg | Freq: Once | INTRAMUSCULAR | Status: AC
  Administered 2013-02-11: 4 mg via INTRAVENOUS
  Filled 2013-02-11: qty 2

## 2013-02-11 MED ORDER — ALUM & MAG HYDROXIDE-SIMETH 200-200-20 MG/5ML PO SUSP: 30.0000 mL | ORAL | Status: DC | PRN

## 2013-02-11 MED ORDER — CLONAZEPAM 1 MG PO TABS
1.0000 mg | ORAL_TABLET | Freq: Every day | ORAL | Status: DC
  Administered 2013-02-12: 1 mg via ORAL
  Filled 2013-02-11 (×3): qty 1

## 2013-02-11 MED ORDER — TRAZODONE HCL 50 MG PO TABS: 50.0000 mg | ORAL_TABLET | Freq: Every evening | ORAL | Status: DC | PRN

## 2013-02-11 MED ORDER — PANTOPRAZOLE SODIUM 40 MG PO TBEC
40.0000 mg | DELAYED_RELEASE_TABLET | Freq: Every day | ORAL | Status: DC
  Administered 2013-02-12 – 2013-02-14 (×3): 40 mg via ORAL
  Filled 2013-02-11 (×5): qty 1

## 2013-02-11 MED ORDER — ALBUTEROL SULFATE HFA 108 (90 BASE) MCG/ACT IN AERS: 2.0000 | INHALATION_SPRAY | Freq: Four times a day (QID) | RESPIRATORY_TRACT | Status: DC | PRN

## 2013-02-11 MED ORDER — CLONAZEPAM 0.5 MG PO TABS
1.0000 mg | ORAL_TABLET | Freq: Every day | ORAL | Status: DC
  Administered 2013-02-11: 1 mg via ORAL
  Filled 2013-02-11: qty 2

## 2013-02-11 MED ORDER — QUETIAPINE FUMARATE ER 50 MG PO TB24
50.0000 mg | ORAL_TABLET | Freq: Every day | ORAL | Status: DC
  Administered 2013-02-11: 50 mg via ORAL
  Filled 2013-02-11 (×3): qty 1

## 2013-02-11 MED ORDER — FENOFIBRATE 160 MG PO TABS
160.0000 mg | ORAL_TABLET | Freq: Every day | ORAL | Status: DC
  Filled 2013-02-11: qty 1

## 2013-02-11 NOTE — ED Notes (Signed)
Patient given sprite to sip on.  

## 2013-02-11 NOTE — ED Notes (Signed)
Patient gave me permission to go through her belongings and get a phone number for "Karen [name]". Number found and given to patient. Patients belongings placed back in [place] [address]

## 2013-02-11 NOTE — ED Notes (Signed)
Bed: WA17 Expected date:  Expected time:  Means of arrival:  Comments: RES A 

## 2013-02-11 NOTE — Tx Team (Signed)
Initial Interdisciplinary Treatment Plan  PATIENT STRENGTHS: (choose at least two) Ability for insight Average or above average intelligence Capable of independent living Supportive family/friends Work skills  PATIENT STRESSORS: Marital or family conflict   PROBLEM LIST: Problem List/Patient Goals Date to be addressed Date deferred Reason deferred Estimated date of resolution  Depression 02/11/13                                                      DISCHARGE CRITERIA:  Ability to meet basic life and health needs Improved stabilization in mood, thinking, and/or behavior Verbal commitment to aftercare and medication compliance  PRELIMINARY DISCHARGE PLAN: Attend aftercare/continuing care group Return to previous living arrangement  PATIENT/FAMIILY INVOLVEMENT: This treatment plan has been presented to and reviewed with the patient, Karen [name], and/or family member, .  The patient and family have been given the opportunity to ask questions and make suggestions.  [name], [place] 02/11/2013, 6:38 PM

## 2013-02-11 NOTE — ED Notes (Signed)
Patient assisted to restroom. Patient requested we call Alecia Lemming at [phone], patient requesting a phone number for "[place]". Spoke to Alecia Lemming, he states he does not have the requested phone number.

## 2013-02-11 NOTE — ED Notes (Signed)
Patient is resting comfortably. 
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
## 2013-02-11 NOTE — ED Notes (Signed)
MD Jeraldine Loots made aware of patient c/o chest pain.

## 2013-02-11 NOTE — ED Notes (Addendum)
Patient belongings: 1 pair of socks, 1 pair of shoes, 1 bra, 1 pair of capri pants, 1 belt, 1 zip up jacket/shirt, 1 pair of underwear, a watch, 3 pairs or earrings, 3 watches, a necklace, a black purse, with a wallet and other miscellaneous contents placed in locker #32. Valuables placed with security include bank card, passport, and cash in deposit box #4.
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
## 2013-02-11 NOTE — ED Notes (Signed)
Patient with emesis at this time.

## 2013-02-11 NOTE — ED Notes (Signed)
Act team at bedside. Interpreter phone used

## 2013-02-11 NOTE — ED Notes (Signed)
Patient called asking for assistance to restroom. Patient had BM while in bed. Patient assisted to restroom to clean herself up.

## 2013-02-11 NOTE — Progress Notes (Signed)
47 year old female pt admitted on voluntary basis after taking an overdose of her medications. On admission, it is noted that pt is spanish speaking, however was able to answer questions and does understand english and is able to communicate in english. Pt denies any SI on admission and able to contract for safety on the unit. Pt does endorse that she took an overdose but did not choose to explain as to why she did it or was unable to effectively communicate as to why she did. 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 Pt does report that she has her own residence and will discharge there when she leaves. Pt does report that she has a doctor that she sees regularly who prescribes her medications. Pt was oriented to the unit and safety maintained.

## 2013-02-11 NOTE — ED Notes (Signed)
Spoke to poison control, updated them on patient condition. Poison control advises they are signing off of the patient at this time.

## 2013-02-11 NOTE — ED Notes (Signed)
Pt ambulated to bathroom with 1 staff assist. Given ice

## 2013-02-11 NOTE — ED Notes (Signed)
Pt very tearful while on the phone

## 2013-02-11 NOTE — ED Notes (Signed)
Pt given phone - reminded pt she can only make 3 calls and this will be her last today.
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
## 2013-02-12 DIAGNOSIS — F411 Generalized anxiety disorder: Secondary | ICD-10-CM

## 2013-02-12 DIAGNOSIS — F332 Major depressive disorder, recurrent severe without psychotic features: Principal | ICD-10-CM

## 2013-02-12 MED ORDER — ACETAMINOPHEN 325 MG PO TABS
650.0000 mg | ORAL_TABLET | Freq: Four times a day (QID) | ORAL | Status: DC | PRN
  Administered 2013-02-12: 650 mg via ORAL

## 2013-02-12 MED ORDER — HYDROXYZINE HCL 50 MG PO TABS
50.0000 mg | ORAL_TABLET | Freq: Four times a day (QID) | ORAL | Status: DC | PRN
  Administered 2013-02-12 – 2013-02-14 (×4): 50 mg via ORAL
  Filled 2013-02-12: qty 30
  Filled 2013-02-12: qty 1

## 2013-02-12 MED ORDER — TRAZODONE HCL 50 MG PO TABS
50.0000 mg | ORAL_TABLET | Freq: Every day | ORAL | Status: DC
  Administered 2013-02-12 – 2013-02-13 (×2): 50 mg via ORAL
  Filled 2013-02-12 (×3): qty 1
  Filled 2013-02-12: qty 14

## 2013-02-12 MED ORDER — SERTRALINE HCL 100 MG PO TABS
100.0000 mg | ORAL_TABLET | Freq: Every day | ORAL | Status: DC
  Administered 2013-02-13: 100 mg via ORAL
  Filled 2013-02-12: qty 1
  Filled 2013-02-12: qty 14
  Filled 2013-02-12: qty 1

## 2013-02-12 NOTE — Clinical Social Work Note (Signed)
Writer sent a request for a Spanish interpreter.

## 2013-02-12 NOTE — Progress Notes (Signed)
Patient ID: Karen [name], female   DOB: 01/15/1966, 47 y.o.   MRN: [id number] Writer spoke to client again with interpreter assist, client gave writer the same information as with interpreter. Pt. Denies SHI. Interpreter said client will agree with information even if she doesn't understand. Pt. Then reports haven't understood everything in groups. Pt. Plans to return home post discharge.

## 2013-02-12 NOTE — Progress Notes (Signed)
Patient ID: Karen [name], female   DOB: 1965/07/15, 47 y.o.   MRN: [id number] D)  Was admitted earlier this evening, but did come down to the dayroom to attend group.  Pleasant, able to communicate fairly well, came over to the med window afterward for hs meds and was ready to go to bed.  No c/o's voiced, no thoughts of self harm, contracts for safety. A)  Will continue to monitor for safety, continue POC R)  Safety maintained.

## 2013-02-12 NOTE — Progress Notes (Signed)
Adult Psychoeducational Group Note  Date:  02/12/2013 Time:  10:37 PM  Group Topic/Focus:  Wrap-Up Group:   The focus of this group is to help patients review their daily goal of treatment and discuss progress on daily workbooks.  Participation Level:  Active  Participation Quality:  Attentive  Affect:  Appropriate  Cognitive:  Appropriate  Insight: Good  Engagement in Group:  Engaged  Modes of Intervention:  Discussion and Support  Additional Comments:  Pt participated with the help of an interpreter.  Reynolds Bowl 02/12/2013, 10:37 PM
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
## 2013-02-12 NOTE — [facility] Group Notes (Signed)
[facility] LCSW Group Therapy  02/12/2013 3:08 PM  Type of Therapy:  Group Therapy  Participation Level:  Minimal -Patient advised she does not understand English and needs an interpreter.  Wynn Banker 02/12/2013, 3:08 PM

## 2013-02-12 NOTE — Progress Notes (Signed)
D:  Patient's self inventory sheet, patient sleeps well, good appetite, normal energy level, good attention span.  Rated depression #2.  Denied hopelessness and anxiety.  Denied withdrawals.  Denied SI.  Has experienced stomach ache in pat 24 hours.  Worst pain #4.  Plans to be discharged home.  Needs financial assistance to purchase meds after discharge. A:  Medications administered per MD orders.  Emotional support and encouragement given patient. R:  Denied SI and HI.  Denied A/V hallucinations.   Denied pain.  Will continue to monitor patient for safety with 15 minute checks.  Safety maintained.
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
## 2013-02-12 NOTE — Progress Notes (Signed)
NUTRITION ASSESSMENT  Pt identified as at risk on the Malnutrition Screen Tool  INTERVENTION: 1. Educated patient on the importance of nutrition and encouraged intake of food and beverages. 2. Discussed weight goals.   NUTRITION DIAGNOSIS: Unintentional weight loss related to sub-optimal intake as evidenced by pt report.   Goal: Pt to meet >/= 90% of their estimated nutrition needs.  Monitor:  PO intake  Assessment:  Patient admitted s/p OD.  Last seen by inpatient RD 11/18.  At that time, patient had a 23 lb weigh loss (15%) in the year prior to this visit.  Patient has continued to loss weight likely secondary to depression.  Met with patient with the interpretor today.  Patient denied weight loss and asked how to gain weight.  Stated that she ate well prior to admit but is a light eater.  Stated that the questions about weight was misinterpreted secondary to not having an interpretor at the time that she was asked by RN about weight loss.  However, patient did not discuss weight loss during the past year with me.    47 y.o. female  Height: Ht Readings from Last 1 Encounters:  02/11/13 5\' 2"  (1.575 m)    Weight: Wt Readings from Last 1 Encounters:  02/11/13 114 lb (51.71 kg)    Weight Hx: Wt Readings from Last 10 Encounters:  02/11/13 114 lb (51.71 kg)  02/02/13 120 lb 6.4 oz (54.613 kg)  11/17/12 117 lb 1.6 oz (53.116 kg)  08/18/12 123 lb 11.2 oz (56.11 kg)  07/17/12 126 lb 6.4 oz (57.335 kg)  06/08/12 120 lb (54.432 kg)  05/08/12 124 lb (56.246 kg)  05/04/12 127 lb (57.607 kg)  04/13/12 129 lb 12.8 oz (58.877 kg)  03/16/12 130 lb 14.4 oz (59.376 kg)    BMI:  Body mass index is 20.85 kg/(m^2). Pt meets criteria for wnl based on current BMI.  Estimated Nutritional Needs: Kcal: 25-30 kcal/kg Protein: > 1 gram protein/kg Fluid: 1 ml/kcal  Diet Order: General Pt is also offered choice of unit snacks mid-morning and mid-afternoon.  Pt is eating as desired.    Lab results and medications reviewed.   Oran Rein, RD, LDN Clinical Inpatient Dietitian Pager:  [phone] Weekend and after hours pager:  [phone]

## 2013-02-12 NOTE — Progress Notes (Signed)
Patient ID: Karen [name], female   DOB: 05-23-1966, 47 y.o.   MRN: [id number] D: pt. Reports "fine" Pt. Reports depression at "2" of 10. A: Writer introduced self to client, encouraged group. Staff will monitor q40min for safety. R: Pt. Is safe on the unit and attends group. Pt. Visits with husband tonight, reports pleasant visit.

## 2013-02-12 NOTE — Progress Notes (Signed)
Recreation Therapy Notes  Date: 08.25.2014 Time: 3:00pm Location: [address] Dayroom  Group Topic: Stress Management  Goal Area(s) Addresses:  Patient will verbalize importance of using healthy stress management.  Patient will identify stress management technique of choice.  Behavioral Response: Did not attend.  Marykay Lex Docia Klar, LRT/CTRS  Jearl Klinefelter 02/12/2013 4:23 PM

## 2013-02-12 NOTE — Progress Notes (Signed)
The focus of this group is to help patients review their daily goal of treatment and discuss progress on daily workbooks.  During wrap up group, Karen [name] presented with some language barriers. She did attempt to participate, stating that she just got here today and that she loves her daughter and son very much. Patient was appropriate and attentive.

## 2013-02-12 NOTE — [facility] Group Notes (Signed)
[facility] LCSW Aftercare Discharge Planning Group Note   02/12/2013 10:27 AM  Participation Quality:  Did not attend group.   Karen [name], Karen [name]
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
## 2013-02-13 DIAGNOSIS — F339 Major depressive disorder, recurrent, unspecified: Secondary | ICD-10-CM

## 2013-02-13 NOTE — Progress Notes (Signed)
Adult Psychoeducational Group Note  Date:  02/13/2013 Time:  11:24 PM  Group Topic/Focus:  Goals Group:   The focus of this group is to help patients establish daily goals to achieve during treatment and discuss how the patient can incorporate goal setting into their daily lives to aide in recovery.  Participation Level:  Active  Participation Quality:  Appropriate  Affect:  Appropriate  Cognitive:  Appropriate  Insight: Appropriate  Engagement in Group:  Engaged  Modes of Intervention:  Discussion  Additional Comments:  Pt stated that she day was good and she feels better.  Terie Purser R 02/13/2013, 11:24 PM

## 2013-02-13 NOTE — Progress Notes (Signed)
D: Patient in her room talking to the interpreter on approach.  Patient states she feels ok.  Patient states she is anxious but does not appear anxious.  Patient is laughing and having conversation with interpreter.  Patient states she loves herself and she needs to take better care of herself.  Patient states she plans on returning to home and back to work when she is discharged.  Patient denies SI/HI and denies AVH.  A: Staff to monitor Q 15 mins for safety.  Encouragement and support offered.  Scheduled medications administered per orders. R: Patient remains safe on the unit.  Patient attended group tonight with the interpreter.  Patient taking administered medications.  Patient visible on the unit but not interacting with peers but talking to her interpreter.

## 2013-02-13 NOTE — [facility] Suicide Risk Assessment (Signed)
[facility] INPATIENT:  Family/Significant Other Suicide Prevention Education  Suicide Prevention Education:  Education Completed; Lazarus Gowda, [phone] through [facility] [phone]; has been identified by the patient as the family member/significant other with whom the patient will be residing, and identified as the person(s) who will aid the patient in the event of a mental health crisis (suicidal ideations/suicide attempt).  With written consent from the patient, the family member/significant other has been provided the following suicide prevention education, prior to the and/or following the discharge of the patient.  The suicide prevention education provided includes the following:  Suicide risk factors  Suicide prevention and interventions  National Suicide Hotline telephone number  [facility] assessment telephone number  [facility] Emergency Assistance 911  [facility] and/or Residential Mobile Crisis Unit telephone number  Request made of family/significant other to:  Remove weapons (e.g., guns, rifles, knives), all items previously/currently identified as safety concern.  Fiance advised patient does not have access to guns.  Remove drugs/medications (over-the-counter, prescriptions, illicit drugs), all items previously/currently identified as a safety concern.  The family member/significant other verbalizes understanding of the suicide prevention education information provided.  The family member/significant other agrees to remove the items of safety concern listed above.  Wynn Banker 02/13/2013, 11:55 AM

## 2013-02-13 NOTE — Progress Notes (Signed)
Recreation Therapy Notes  Date: 08.26.2014 Time: 2:45pm Location: [address] Dayroom  Group Topic: Animal Assisted Activities  Goal Area(s) Addresses:  Patient will interact appropriately with dog team.    Behavioral Response: Did not attend.  Jaysun Wessels L Delaney Perona, LRT/CTRS  Damoni Causby L 02/13/2013 4:52 PM 

## 2013-02-13 NOTE — Progress Notes (Signed)
The focus of this group is to educate the patient on the purpose and policies of crisis stabilization and provide a format to answer questions about their admission.  The group details unit policies and expectations of patients while admitted.  Patient attended 0900 nurse education orientation group this morning.  Patient listened, appropriate affect, alert, appropriate insight and engagement.  Patient will work on goals for discharge today.  Patient's interpreter was present.
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
## 2013-02-13 NOTE — Progress Notes (Signed)
Adult Psychoeducational Group Note  Date:  02/13/2013 Time:  1:25 PM  Group Topic/Focus:  Recovery Goals:   The focus of this group is to identify appropriate goals for recovery and establish a plan to achieve them.  Participation Level:  Minimal  Participation Quality:  Appropriate and Attentive  Affect:  Appropriate  Cognitive:  Alert and Appropriate  Insight: Good  Engagement in Group:  Engaged  Modes of Intervention:  Discussion, Socialization and Support  Additional Comments:  Pt used a Nurse, learning disability during group. Pt shared that friends and attitudes were standing between her and recovery. Pt plans on changing this by not always helping her friends and putting herself first and having a better attitude about herself and taking better care of herself.  [name] 02/13/2013, 1:25 PM

## 2013-02-13 NOTE — [facility] Group Notes (Signed)
[facility] LCSW Group Therapy      Feelings About Diagnosis 1:15 - 2:30 PM         02/13/2013  12:58 PM    Type of Therapy:  Group Therapy  Participation Level:  Active  Participation Quality:  Appropriate  Affect:  Appropriate  Cognitive:  Alert and Appropriate  Insight:  Developing/Improving and Engaged  Engagement in Therapy:  Developing/Improving and Engaged  Modes of Intervention:  Discussion, Education, Exploration, Problem-Solving, Rapport Building, Support  Summary of Progress/Problems:  Patient actively participated in group. Patient shared if the MD gave her the diagnosis, it must be true.  She stated she plans to take her medication and do whatever the MD says.   Wynn Banker 02/13/2013  12:58 PM

## 2013-02-13 NOTE — Progress Notes (Addendum)
D:  Patient's self inventory sheet, patient sleeps well, good appetite, normal energy level, good attention span.  Rated depression and hopelessness #1.  Denied withdrawals.  Denied SI.  Has experienced headache in past 24 hours.  Pain goal #7, worst pain #5.  After discharge, wants to talk to her son more often.  No questions for staff.  No problems taking meds after discharge. A:  Medications administered per MD orders.  Emotional support and encouragement given patient. R:  Denied SI and HI.   Denied A/V hallucinations.  Denied pain.  Will continue to monitor patient for safety with 15 minute checks.  Safety maintained.  Patient has interpreter with her today, has been attending groups.

## 2013-02-14 MED ORDER — FENOFIBRATE 145 MG PO TABS: 145.0000 mg | ORAL_TABLET | Freq: Every day | ORAL | Status: DC

## 2013-02-14 MED ORDER — SERTRALINE HCL 100 MG PO TABS: ORAL_TABLET | ORAL | Status: DC

## 2013-02-14 MED ORDER — SUCRALFATE 1 G PO TABS: 1.0000 g | ORAL_TABLET | Freq: Four times a day (QID) | ORAL | Status: DC

## 2013-02-14 MED ORDER — TRAZODONE HCL 50 MG PO TABS: 50.0000 mg | ORAL_TABLET | Freq: Every day | ORAL | Status: DC

## 2013-02-14 MED ORDER — HYDROXYZINE HCL 50 MG PO TABS: 50.0000 mg | ORAL_TABLET | Freq: Four times a day (QID) | ORAL | Status: DC | PRN

## 2013-02-14 MED ORDER — ESOMEPRAZOLE MAGNESIUM 20 MG PO CPDR: 20.0000 mg | DELAYED_RELEASE_CAPSULE | Freq: Every day | ORAL | Status: DC

## 2013-02-14 MED ORDER — ALBUTEROL SULFATE HFA 108 (90 BASE) MCG/ACT IN AERS: 2.0000 | INHALATION_SPRAY | Freq: Four times a day (QID) | RESPIRATORY_TRACT | Status: DC | PRN

## 2013-02-14 MED ORDER — QUETIAPINE FUMARATE ER 50 MG PO TB24: 50.0000 mg | ORAL_TABLET | Freq: Every day | ORAL | Status: DC

## 2013-02-14 NOTE — Progress Notes (Signed)
Adult Psychoeducational Group Note  Date:  02/14/2013 Time:  11:08 AM  Group Topic/Focus:  Personal Choices and Values:   The focus of this group is to help patients assess and explore the importance of values in their lives, how their values affect their decisions, how they express their values and what opposes their expression.  Participation Level:  Active  Participation Quality:  Appropriate  Affect:  Appropriate  Cognitive:  Alert and Appropriate  Insight: Appropriate  Engagement in Group:  Engaged  Modes of Intervention:  Discussion  Additional Comments:  Today's group was a therapeutic activity. Pt attended group and was very engaged.    [place] Shi K 02/14/2013, 11:08 AM

## 2013-02-14 NOTE — Progress Notes (Signed)
Pt d/c from hospital. All items returned. D/C instructions given with interpretor and teach back. Prescriptions given and samples given. Pt denies si and hi.
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
## 2013-02-14 NOTE — Tx Team (Addendum)
Interdisciplinary Treatment Plan Update   Date Reviewed:  02/14/2013  Time Reviewed:  10:33 AM  Progress in Treatment:   Attending groups: Yes Participating in groups: Yes Taking medication as prescribed: Yes  Tolerating medication: Yes Family/Significant other contact made: Yes, contact made with fiancee. Patient understands diagnosis: Yes  Discussing patient identified problems/goals with staff: Yes Medical problems stabilized or resolved: Yes Denies suicidal/homicidal ideation: Yes Patient has not harmed self or others: Yes  For review of initial/current patient goals, please see plan of care.  Estimated Length of Stay:  Discharge today  Reasons for Continued Hospitalization:   New Problems/Goals identified:   Discharge Plan or Barriers:   Home with outpatient follow up at [facility]  Additional Comments:  N/A  Attendees:  Patient:  Karen [name] 02/14/2013 10:33 AM   Signature: Mervyn Gay, MD 02/14/2013 10:33 AM  Signature:  Harold Barban, RN 02/14/2013 10:33 AM  Signature: Marlane Hatcher, Interpreter 02/14/2013 10:33 AM  Signature: 02/14/2013 10:33 AM  Signature:   02/14/2013 10:33 AM  Signature:  Juline Patch, LCSW 02/14/2013 10:33 AM  Signature:  Reyes Ivan, LCSW 02/14/2013 10:33 AM  Signature:  Maseta Dorley,Care Coordinator 02/14/2013 10:33 AM  Signature: 02/14/2013 10:33 AM  Signature:    Signature:    Signature:      Scribe for Treatment Team:   Juline Patch,  02/14/2013 10:33 AM

## 2013-02-14 NOTE — [facility] Group Notes (Signed)
[facility] LCSW Aftercare Discharge Planning Group Note   02/14/2013 9:56 AM    Participation Quality:  Appropraite  Mood/Affect:  Appropriate  Depression Rating:  1  Anxiety Rating: 1   Thoughts of Suicide:  No  Will you contract for safety?   NA  Current AVH:  No  Plan for Discharge/Comments:  Patient attending discharge planning group and actively participated in group.  Patient reports feeling much better and hopes to discharge home today.  She will follow up with [facility] CSW provided all participants with daily workbook.   Transportation Means: Patient has transportation.   Supports:  Patient has a support system.   Beonka Amesquita, Joes[place] July

## 2013-02-14 NOTE — Progress Notes (Signed)
Adult Psychoeducational Group Note  Date:  02/14/2013 Time:  11:00AM Group Topic/Focus:  personal development  Participation Level:  Active  Participation Quality:  Appropriate and Attentive  Affect:  Appropriate  Cognitive:  Alert and Appropriate  Insight: Appropriate  Engagement in Group:  Engaged  Modes of Intervention:  Discussion Pt. Was attentive and appropriate during today's group discussion. Pt. Was able to write down values. Pt was able to decide if the way you are living now in live with those values. Pt. Was able to break down values in groups values, what do i want to happen and what am i going to do about it.  Additional Comments:    April Manson 02/14/2013, 1:07 PM

## 2013-02-14 NOTE — Progress Notes (Signed)
[facility] Adult Case Management Discharge Plan :  Will you be returning to the same living situation after discharge: Yes,  patient will return to her home. At discharge, do you have transportation home?:Yes, patient will arrange transportation home. Do you have the ability to pay for your medications:No.   Patient needs assistance with indigent medications   Release of information consent forms completed and in the chart;  Patient's signature needed at discharge.  Patient to Follow up at: Follow-up Information   Follow up with [facility] On 02/15/2013. (Please go to [facility]'s walk in clinic on Thursday, February 15, 2013 or any weekday between 8AM - 3PM)    Contact information:   [address] [place], [place]  [address]  [phone]      Patient denies SI/HI:  Patient no longer endorsing SI/HI or other thoughts of self harm.   Safety Planning and Suicide Prevention discussed:  .Reviewed with all patients during discharge planning group   Alexas Basulto, Joesph July 02/14/2013, 10:36 AM
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
## 2013-02-16 NOTE — Progress Notes (Signed)
Patient Discharge Instructions:  After Visit Summary (AVS):   Faxed to:  02/16/13 Discharge Summary Note:   Faxed to:  02/16/13 Psychiatric Admission Assessment Note:   Faxed to:  02/16/13 Suicide Risk Assessment - Discharge Assessment:   Faxed to:  02/16/13 Faxed/Sent to the Next Level Care provider:  02/16/13 Faxed to [facility] @ [phone]  Jerelene Redden, 02/16/2013, 4:32 PM

## 2013-05-10 ENCOUNTER — Encounter (HOSPITAL_COMMUNITY): Payer: Self-pay | Admitting: Emergency Medicine

## 2013-05-10 ENCOUNTER — Emergency Department (HOSPITAL_COMMUNITY)
Admission: EM | Admit: 2013-05-10 | Discharge: 2013-05-10 | Disposition: A | Discharge status: Home / Self Care | Payer: No Typology Code available for payment source | Attending: Emergency Medicine | Admitting: Emergency Medicine

## 2013-05-10 DIAGNOSIS — Z79899 Other long term (current) drug therapy: Secondary | ICD-10-CM | POA: Insufficient documentation

## 2013-05-10 DIAGNOSIS — F411 Generalized anxiety disorder: Secondary | ICD-10-CM | POA: Insufficient documentation

## 2013-05-10 DIAGNOSIS — G43909 Migraine, unspecified, not intractable, without status migrainosus: Secondary | ICD-10-CM | POA: Insufficient documentation

## 2013-05-10 DIAGNOSIS — R42 Dizziness and giddiness: Secondary | ICD-10-CM | POA: Insufficient documentation

## 2013-05-10 DIAGNOSIS — G8929 Other chronic pain: Secondary | ICD-10-CM | POA: Insufficient documentation

## 2013-05-10 DIAGNOSIS — F3289 Other specified depressive episodes: Secondary | ICD-10-CM | POA: Insufficient documentation

## 2013-05-10 DIAGNOSIS — H8110 Benign paroxysmal vertigo, unspecified ear: Secondary | ICD-10-CM | POA: Insufficient documentation

## 2013-05-10 DIAGNOSIS — M542 Cervicalgia: Secondary | ICD-10-CM | POA: Insufficient documentation

## 2013-05-10 DIAGNOSIS — F329 Major depressive disorder, single episode, unspecified: Secondary | ICD-10-CM | POA: Insufficient documentation

## 2013-05-10 DIAGNOSIS — I10 Essential (primary) hypertension: Secondary | ICD-10-CM | POA: Insufficient documentation

## 2013-05-10 DIAGNOSIS — Z8719 Personal history of other diseases of the digestive system: Secondary | ICD-10-CM | POA: Insufficient documentation

## 2013-05-10 LAB — POCT I-STAT, CHEM 8
BUN: 41 mg/dL — ABNORMAL HIGH (ref 6–23)
Calcium, Ion: 1.21 mmol/L (ref 1.12–1.23)
Chloride: 103 mEq/L (ref 96–112)
Glucose, Bld: 94 mg/dL (ref 70–99)
Hemoglobin: 12.9 g/dL (ref 12.0–15.0)

## 2013-05-10 LAB — URINALYSIS, ROUTINE W REFLEX MICROSCOPIC
Glucose, UA: NEGATIVE mg/dL
Hgb urine dipstick: NEGATIVE
Ketones, ur: NEGATIVE mg/dL
Leukocytes, UA: NEGATIVE
Protein, ur: NEGATIVE mg/dL
Specific Gravity, Urine: 1.007 (ref 1.005–1.030)
Urobilinogen, UA: 1 mg/dL (ref 0.0–1.0)

## 2013-05-10 MED ORDER — SODIUM CHLORIDE 0.9 % IV BOLUS (SEPSIS)
1000.0000 mL | Freq: Once | INTRAVENOUS | Status: AC
  Administered 2013-05-10: 1000 mL via INTRAVENOUS

## 2013-05-10 MED ORDER — METHYLPREDNISOLONE SODIUM SUCC 125 MG IJ SOLR
125.0000 mg | Freq: Once | INTRAMUSCULAR | Status: AC
  Administered 2013-05-10: 125 mg via INTRAVENOUS
  Filled 2013-05-10: qty 2

## 2013-05-10 MED ORDER — MECLIZINE HCL 25 MG PO TABS: 25.0000 mg | ORAL_TABLET | Freq: Three times a day (TID) | ORAL | Status: DC | PRN

## 2013-05-10 MED ORDER — MECLIZINE HCL 25 MG PO TABS
25.0000 mg | ORAL_TABLET | Freq: Once | ORAL | Status: AC
  Administered 2013-05-10: 25 mg via ORAL
  Filled 2013-05-10: qty 1

## 2013-05-10 MED ORDER — ONDANSETRON 4 MG PO TBDP
4.0000 mg | ORAL_TABLET | Freq: Once | ORAL | Status: AC
  Administered 2013-05-10: 4 mg via ORAL
  Filled 2013-05-10: qty 1

## 2013-05-10 MED ORDER — KETOROLAC TROMETHAMINE 60 MG/2ML IM SOLN
60.0000 mg | Freq: Once | INTRAMUSCULAR | Status: AC
  Administered 2013-05-10: 60 mg via INTRAMUSCULAR
  Filled 2013-05-10: qty 2

## 2013-05-10 MED ORDER — DIPHENHYDRAMINE HCL 25 MG PO CAPS
25.0000 mg | ORAL_CAPSULE | Freq: Once | ORAL | Status: AC
  Administered 2013-05-10: 25 mg via ORAL
  Filled 2013-05-10: qty 1

## 2013-05-10 MED ORDER — IBUPROFEN 600 MG PO TABS: 600.0000 mg | ORAL_TABLET | Freq: Four times a day (QID) | ORAL | Status: DC | PRN

## 2013-05-10 MED ORDER — ACETAMINOPHEN 325 MG PO TABS
650.0000 mg | ORAL_TABLET | Freq: Once | ORAL | Status: AC
  Administered 2013-05-10: 650 mg via ORAL
  Filled 2013-05-10: qty 2

## 2013-05-10 MED ORDER — METOCLOPRAMIDE HCL 10 MG PO TABS: 10.0000 mg | ORAL_TABLET | Freq: Four times a day (QID) | ORAL | Status: DC | PRN

## 2013-05-10 NOTE — ED Notes (Signed)
Pt ambulated to bathroom independently
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
## 2013-05-10 NOTE — ED Notes (Addendum)
Pt states CP that started 2 days ago. Pt states dizziness as well as a migraine that started yesterday. Pt states that when stands the room spins. Pt states that her eyes hurt and she feels like she is nausea. Pt states that she has been under a lot of stress lately. Pt states that the CP is center chest and does not radiate. Moving does not make it worse. Pt states pain is constant.

## 2013-05-10 NOTE — ED Notes (Signed)
Pt states that she still has some dizziness when she turns her head. Pt states that she still is having CP and a HA, no change in pain after medication given EDP aware.

## 2013-05-10 NOTE — ED Notes (Signed)
Pt in c/o migraine x1 week, states pain is worse tonight, history of same, states she has vomited today

## 2013-05-11 ENCOUNTER — Other Ambulatory Visit: Payer: Self-pay | Admitting: Internal Medicine

## 2013-05-11 ENCOUNTER — Encounter: Payer: Self-pay | Admitting: Internal Medicine

## 2013-05-11 ENCOUNTER — Ambulatory Visit (INDEPENDENT_AMBULATORY_CARE_PROVIDER_SITE_OTHER): Payer: No Typology Code available for payment source | Admitting: *Deleted

## 2013-05-11 ENCOUNTER — Ambulatory Visit (INDEPENDENT_AMBULATORY_CARE_PROVIDER_SITE_OTHER): Payer: No Typology Code available for payment source | Admitting: Internal Medicine

## 2013-05-11 VITALS — BP 102/57 | HR 65 | Temp 97.0°F | Ht 62.0 in | Wt 118.2 lb

## 2013-05-11 DIAGNOSIS — G4700 Insomnia, unspecified: Secondary | ICD-10-CM

## 2013-05-11 DIAGNOSIS — Z23 Encounter for immunization: Secondary | ICD-10-CM

## 2013-05-11 DIAGNOSIS — G43909 Migraine, unspecified, not intractable, without status migrainosus: Secondary | ICD-10-CM

## 2013-05-11 DIAGNOSIS — F411 Generalized anxiety disorder: Secondary | ICD-10-CM

## 2013-05-11 MED ORDER — IBUPROFEN 800 MG PO TABS: 800.0000 mg | ORAL_TABLET | Freq: Three times a day (TID) | ORAL | Status: DC | PRN

## 2013-05-11 NOTE — Progress Notes (Signed)
  Subjective:    Patient ID: Karen [name], female    DOB: 1966-02-11, 47 y.o.   MRN: [id number]  HPI  History significant for major depression with prior suicide attempt and hospitalization August 2014 followed by [facility], insomnia, anxiety and migraines. She was recently evaluated in the emergency room yesterday for migraine and discharged with ibuprofen for headache management. Presents for refill of Klonopin and Ambien. Via Spanish interpreter patient states that she has a followup appointment with [facility] in 3 months. Denies suicidal ideation, homicidal ideation, depressed mood but does continue to endorse anxiety and insomnia. States that she does not feel the current dosage of Ambien 5 mg is as effective as the 10 mg dose.   Review of Systems  Constitutional: Negative.   Eyes: Negative.   Respiratory: Positive for shortness of breath.        Reports shortness of breath approximately one month ago currently without dyspnea  Cardiovascular: Positive for chest pain.       Reports occasional chest pain, last episode over one month ago  Gastrointestinal: Negative.   Endocrine: Negative.   Genitourinary: Negative.   Musculoskeletal: Negative.   Skin: Negative.   Allergic/Immunologic: Negative.   Neurological: Positive for headaches.  Hematological: Negative.   Psychiatric/Behavioral: Positive for sleep disturbance. Negative for suicidal ideas, hallucinations, self-injury and dysphoric mood. The patient is nervous/anxious.        Objective:   Physical Exam  Constitutional: She is oriented to person, place, and time. She appears well-developed and well-nourished. No distress.  Brings her own interpreter but speaks and understands English fairly well  HENT:  Head: Normocephalic and atraumatic.  Eyes: Conjunctivae and EOM are normal. Pupils are equal, round, and reactive to light.  Neck: Neck supple.  Cardiovascular: Normal rate.   Pulmonary/Chest: Effort normal.  Abdominal:  She exhibits no distension.  Musculoskeletal: She exhibits no edema.  Neurological: She is alert and oriented to person, place, and time.  Skin: Skin is warm and dry.  Psychiatric: She has a normal mood and affect. Her speech is normal and behavior is normal. Judgment and thought content normal. Thought content is not paranoid. Cognition and memory are normal. She expresses no homicidal and no suicidal ideation.          Assessment & Plan:  See separate problem list charting:  #1 anxiety: Spoke with the patient's PCP today who is refill her Klonopin  #2 insomnia: After approval from patient's PCP Ambien 5 mg was refilled  #3 depression: Currently stable on Zoloft  #4 migraines/headaches: Currently without headache but states that 500 mg of ibuprofen is not very affect -Will increase ibuprofen to 800 mg 3 times a day for better management of her pain  #5 disposition: Patient is without suicidal or homicidal ideation states that her mood is improved -Will plan followup in 2-3 weeks with her PCP Dr. Burtis Junes

## 2013-05-11 NOTE — Patient Instructions (Addendum)
You medication for anxiety and sleep have been refilled. Take as instructed. If your mood worsens and you have thoughts of hurting yourself or someone else please got to the Emergency Room for help. Fill the prescription for a stronger ibuprofen.  It has been sent to your pharmacy. Follow up with your Primary Doctor in ~3 weeks.

## 2013-05-11 NOTE — Telephone Encounter (Signed)
Rxs given to pt at the OV today per Dr Burtis Junes.

## 2013-05-22 NOTE — Progress Notes (Signed)
Case discussed with Dr. Schooler at the time of the visit.  We reviewed the resident's history and exam and pertinent patient test results.  I agree with the assessment, diagnosis, and plan of care documented in the resident's note.     

## 2013-06-01 ENCOUNTER — Ambulatory Visit (INDEPENDENT_AMBULATORY_CARE_PROVIDER_SITE_OTHER): Payer: No Typology Code available for payment source | Admitting: Internal Medicine

## 2013-06-01 VITALS — BP 96/57 | HR 57 | Temp 97.8°F | Ht 62.0 in | Wt 119.6 lb

## 2013-06-01 DIAGNOSIS — M545 Low back pain, unspecified: Secondary | ICD-10-CM

## 2013-06-01 DIAGNOSIS — S39012A Strain of muscle, fascia and tendon of lower back, initial encounter: Secondary | ICD-10-CM

## 2013-06-01 DIAGNOSIS — R51 Headache: Secondary | ICD-10-CM

## 2013-06-01 DIAGNOSIS — M7918 Myalgia, other site: Secondary | ICD-10-CM

## 2013-06-01 DIAGNOSIS — Z5987 Material hardship due to limited financial resources, not elsewhere classified: Secondary | ICD-10-CM

## 2013-06-01 DIAGNOSIS — G43909 Migraine, unspecified, not intractable, without status migrainosus: Secondary | ICD-10-CM

## 2013-06-01 DIAGNOSIS — Z598 Other problems related to housing and economic circumstances: Secondary | ICD-10-CM | POA: Insufficient documentation

## 2013-06-01 DIAGNOSIS — S335XXA Sprain of ligaments of lumbar spine, initial encounter: Secondary | ICD-10-CM

## 2013-06-01 DIAGNOSIS — Z599 Problem related to housing and economic circumstances, unspecified: Secondary | ICD-10-CM

## 2013-06-01 MED ORDER — KETOROLAC TROMETHAMINE 30 MG/ML IJ SOLN
30.0000 mg | Freq: Once | INTRAMUSCULAR | Status: AC
  Administered 2013-06-01: 15 mg via INTRAMUSCULAR

## 2013-06-01 NOTE — Assessment & Plan Note (Signed)
patient was provided with some food and a bus pass for her to get home -referral to social work

## 2013-06-01 NOTE — Progress Notes (Signed)
Interpreter Wyvonnia Dusky for Christen Bame RN

## 2013-06-01 NOTE — Assessment & Plan Note (Signed)
increased work load at work and no localizing neurological findings, but some paraspinal tenderness -PT referral -IM toradol in clinic

## 2013-06-01 NOTE — Assessment & Plan Note (Signed)
no focal neurological deficits, patient has not been able to take any ibuprofen or other abortive migraine medication thus exacerbating the symptoms at this time. -IM toradol 15mg 
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
## 2013-06-05 ENCOUNTER — Telehealth: Payer: Self-pay | Admitting: Licensed Clinical Social Worker

## 2013-06-05 NOTE — Telephone Encounter (Signed)
[name] was referred to CSW for community resources.  CSW utilized [employer] to attempt to contact patient.  Main number is voicemail of another person, no message left.  Called back and person states they do no know anyone with last name Karen [name], new phone number for this person.  Mr. [name] no longer has a relationship with pt and is not in contact with her.  CSW will mail information to patient on [employer].  CSW will sign off, letter mailed.

## 2013-06-05 NOTE — Progress Notes (Signed)
Case discussed with Dr. Sadek soon after the resident saw the patient.  We reviewed the resident's history and exam and pertinent patient test results.  I agree with the assessment, diagnosis, and plan of care documented in the resident's note. 

## 2013-06-12 ENCOUNTER — Ambulatory Visit: Payer: No Typology Code available for payment source

## 2013-06-19 ENCOUNTER — Encounter: Payer: Self-pay | Admitting: *Deleted

## 2013-06-22 ENCOUNTER — Ambulatory Visit: Payer: No Typology Code available for payment source

## 2013-06-26 ENCOUNTER — Encounter: Payer: Self-pay | Admitting: Licensed Clinical Social Worker

## 2013-06-27 ENCOUNTER — Other Ambulatory Visit: Payer: Self-pay | Admitting: *Deleted

## 2013-06-27 MED ORDER — ALBUTEROL SULFATE HFA 108 (90 BASE) MCG/ACT IN AERS: 2.0000 | INHALATION_SPRAY | Freq: Four times a day (QID) | RESPIRATORY_TRACT | Status: DC | PRN

## 2013-06-27 MED ORDER — SERTRALINE HCL 100 MG PO TABS: 100.0000 mg | ORAL_TABLET | Freq: Every day | ORAL | Status: DC

## 2013-06-27 NOTE — Progress Notes (Signed)
Ms. Edsel Petrin presents this afternoon with significant other to see CSW.  Ms. Edsel Petrin is Spanish speaking only; however, s/o speaks English/Spanish. Pt and s/o in need of emergency assistance for rent.  Pt no longer receives food stamps because of the type of Visa.  No longer has telephone service due to inability to afford.  S/O no longer working as he is a Theme park manager and work is mostly seasonal.  S/O states they are a month behind on rent and no working cell numbers.  Ms. Edsel Petrin is still employed but unable to maintain all they bills.  Pt has utilized [employer] in the Fall 2014 to help with utilities.  [employer] currently not able to provide rental assistance until Feb. 2015.  [employer] unable to provide assistance until the middle of Jan 2015.  CSW provided pt and s/o with information on [employer] and instructions to call agency on 07/05/13.  Telephoned [facility], pt was able to speak with Spanish speaking staff.  Pt provided contact names for follow with [facility].  CSW provided information for [employer], called and Spanish speaking staff member available during today's business hours.  In addition, information to [employer] provided.  Food and hygiene items provided from [facility] pantry, as well as two all day bus passes.

## 2013-06-27 NOTE — Telephone Encounter (Signed)
Also needs refill Tricor 145mg  #90 - last refill 03/05/13 - date written 03/16/12.

## 2013-06-28 NOTE — Telephone Encounter (Signed)
Both Rx called in to pharmacy. 

## 2013-07-16 ENCOUNTER — Ambulatory Visit: Payer: No Typology Code available for payment source | Attending: Internal Medicine | Admitting: Physical Therapy

## 2013-07-16 DIAGNOSIS — R209 Unspecified disturbances of skin sensation: Secondary | ICD-10-CM | POA: Insufficient documentation

## 2013-07-16 DIAGNOSIS — M542 Cervicalgia: Secondary | ICD-10-CM | POA: Insufficient documentation

## 2013-07-16 DIAGNOSIS — R29898 Other symptoms and signs involving the musculoskeletal system: Secondary | ICD-10-CM | POA: Insufficient documentation

## 2013-07-16 DIAGNOSIS — IMO0001 Reserved for inherently not codable concepts without codable children: Secondary | ICD-10-CM | POA: Insufficient documentation

## 2013-07-24 ENCOUNTER — Ambulatory Visit: Payer: No Typology Code available for payment source | Attending: Internal Medicine | Admitting: Physical Therapy

## 2013-07-24 DIAGNOSIS — R29898 Other symptoms and signs involving the musculoskeletal system: Secondary | ICD-10-CM | POA: Insufficient documentation

## 2013-07-24 DIAGNOSIS — M542 Cervicalgia: Secondary | ICD-10-CM | POA: Insufficient documentation

## 2013-07-24 DIAGNOSIS — IMO0001 Reserved for inherently not codable concepts without codable children: Secondary | ICD-10-CM | POA: Insufficient documentation

## 2013-07-24 DIAGNOSIS — R209 Unspecified disturbances of skin sensation: Secondary | ICD-10-CM | POA: Insufficient documentation

## 2013-07-26 ENCOUNTER — Encounter: Payer: Self-pay | Admitting: Physical Therapy

## 2013-07-31 ENCOUNTER — Encounter: Payer: Self-pay | Admitting: Physical Therapy

## 2013-08-02 ENCOUNTER — Ambulatory Visit: Payer: No Typology Code available for payment source | Admitting: Physical Therapy

## 2013-08-14 ENCOUNTER — Ambulatory Visit: Payer: No Typology Code available for payment source | Admitting: Physical Therapy

## 2013-08-23 ENCOUNTER — Ambulatory Visit: Payer: No Typology Code available for payment source | Admitting: Physical Therapy

## 2013-09-04 ENCOUNTER — Telehealth: Payer: Self-pay | Admitting: *Deleted

## 2013-09-04 NOTE — Telephone Encounter (Signed)
Fax from [facility] - wants to know if pt should be on Seroquel XR 50mg  or has it been discontinued?   If she is on med, need new rx .  Thanks

## 2013-09-05 NOTE — Telephone Encounter (Signed)
[facility] informed.

## 2013-09-05 NOTE — Telephone Encounter (Signed)
This provider has not written for this medication. Pt does have a counselor/pyschiatrist that maybe has/is prescribing it.   Thank you.

## 2013-09-19 ENCOUNTER — Ambulatory Visit (HOSPITAL_COMMUNITY)
Admission: RE | Admit: 2013-09-19 | Discharge: 2013-09-19 | Disposition: A | Discharge status: Home / Self Care | Payer: No Typology Code available for payment source | Source: Ambulatory Visit | Attending: Internal Medicine | Admitting: Internal Medicine

## 2013-09-19 ENCOUNTER — Ambulatory Visit (INDEPENDENT_AMBULATORY_CARE_PROVIDER_SITE_OTHER): Payer: No Typology Code available for payment source | Admitting: Internal Medicine

## 2013-09-19 ENCOUNTER — Encounter: Payer: Self-pay | Admitting: Internal Medicine

## 2013-09-19 VITALS — BP 112/69 | HR 57 | Temp 97.5°F | Ht 62.0 in | Wt 117.3 lb

## 2013-09-19 DIAGNOSIS — R0789 Other chest pain: Secondary | ICD-10-CM | POA: Insufficient documentation

## 2013-09-19 DIAGNOSIS — N179 Acute kidney failure, unspecified: Secondary | ICD-10-CM

## 2013-09-19 DIAGNOSIS — R638 Other symptoms and signs concerning food and fluid intake: Secondary | ICD-10-CM

## 2013-09-19 DIAGNOSIS — R079 Chest pain, unspecified: Secondary | ICD-10-CM

## 2013-09-19 DIAGNOSIS — E785 Hyperlipidemia, unspecified: Secondary | ICD-10-CM

## 2013-09-19 DIAGNOSIS — Z0000 Encounter for general adult medical examination without abnormal findings: Secondary | ICD-10-CM | POA: Insufficient documentation

## 2013-09-19 DIAGNOSIS — N7011 Chronic salpingitis: Secondary | ICD-10-CM

## 2013-09-19 DIAGNOSIS — E781 Pure hyperglyceridemia: Secondary | ICD-10-CM

## 2013-09-19 DIAGNOSIS — N898 Other specified noninflammatory disorders of vagina: Secondary | ICD-10-CM | POA: Insufficient documentation

## 2013-09-19 DIAGNOSIS — Z1239 Encounter for other screening for malignant neoplasm of breast: Secondary | ICD-10-CM

## 2013-09-19 DIAGNOSIS — N9489 Other specified conditions associated with female genital organs and menstrual cycle: Secondary | ICD-10-CM

## 2013-09-19 DIAGNOSIS — R21 Rash and other nonspecific skin eruption: Secondary | ICD-10-CM

## 2013-09-19 DIAGNOSIS — R109 Unspecified abdominal pain: Secondary | ICD-10-CM

## 2013-09-19 DIAGNOSIS — F339 Major depressive disorder, recurrent, unspecified: Secondary | ICD-10-CM

## 2013-09-19 DIAGNOSIS — R6882 Decreased libido: Secondary | ICD-10-CM

## 2013-09-19 LAB — BASIC METABOLIC PANEL WITH GFR
BUN: 21 mg/dL (ref 6–23)
CO2: 25 mEq/L (ref 19–32)
Calcium: 9.2 mg/dL (ref 8.4–10.5)
Chloride: 101 mEq/L (ref 96–112)
Creat: 0.92 mg/dL (ref 0.50–1.10)
GFR, Est African American: 86 mL/min
GFR, Est Non African American: 74 mL/min
GLUCOSE: 74 mg/dL (ref 70–99)
Potassium: 4.7 mEq/L (ref 3.5–5.3)
Sodium: 134 mEq/L — ABNORMAL LOW (ref 135–145)

## 2013-09-19 LAB — LIPID PANEL
CHOLESTEROL: 106 mg/dL (ref 0–200)
HDL: 42 mg/dL (ref >39)
LDL CALC: 51 mg/dL (ref 0–99)
TRIGLYCERIDES: 65 mg/dL (ref <150)
Total CHOL/HDL Ratio: 2.5 Ratio
VLDL: 13 mg/dL (ref 0–40)

## 2013-09-19 LAB — CBC WITH DIFFERENTIAL/PLATELET
BASOS ABS: 0 10*3/uL (ref 0.0–0.1)
BASOS PCT: 0 % (ref 0–1)
EOS ABS: 0.1 10*3/uL (ref 0.0–0.7)
Eosinophils Relative: 2 % (ref 0–5)
HCT: 36.7 % (ref 36.0–46.0)
HEMOGLOBIN: 11.8 g/dL — AB (ref 12.0–15.0)
Lymphocytes Relative: 28 % (ref 12–46)
Lymphs Abs: 1.8 10*3/uL (ref 0.7–4.0)
MCH: 28.8 pg (ref 26.0–34.0)
MCHC: 32.2 g/dL (ref 30.0–36.0)
MCV: 89.5 fL (ref 78.0–100.0)
MONO ABS: 0.5 10*3/uL (ref 0.1–1.0)
MONOS PCT: 7 % (ref 3–12)
NEUTROS PCT: 63 % (ref 43–77)
Neutro Abs: 4.2 10*3/uL (ref 1.7–7.7)
Platelets: 405 10*3/uL — ABNORMAL HIGH (ref 150–400)
RBC: 4.1 MIL/uL (ref 3.87–5.11)
RDW: 14.5 % (ref 11.5–15.5)
WBC: 6.6 10*3/uL (ref 4.0–10.5)

## 2013-09-19 NOTE — Assessment & Plan Note (Signed)
She is followed at [facility] with appointment tomorrow. Pt advised to discuss her current relationship stress 2/2 decreased libido and ask about possible couples therapy for this. The patient's partner is willing to go to couples therapy--she states that she feels safe in the relationship and they both are interested in staying together.

## 2013-09-19 NOTE — Assessment & Plan Note (Addendum)
Atypical chest pain that has been present for year. EKG performed today with mild sinus tachycardia, HR of 54, but no ST changes compared to previous tracings. HEART score of 2 (for age and 2 equivalents of CAD) with 0.9-1.2% risk of major acute coronary event in 6 weeks. Her symptoms are unlikely to be cardiac in nature. She has chest wall tenderness and costochondritis is the most likely explanation for her symptoms. Nonetheless, as she advances in age, she may need a repeat stress test if her chest pain worsens.

## 2013-09-19 NOTE — Assessment & Plan Note (Signed)
>>  ASSESSMENT AND PLAN FOR HYPERTRIGLYCERIDEMIA WRITTEN ON 09/19/2013  1:44 PM BY Sara Chu D, MD  Patient assures compliance with her statin, has not had lipid panel in >67yr and requests that this be checked today.  -Ordered lipid panel

## 2013-09-19 NOTE — Assessment & Plan Note (Signed)
Likely due to pt's current stress and anxiety. Pt advised to discuss current stressors with her mental health provider and ask about couple's therapy.

## 2013-09-19 NOTE — Assessment & Plan Note (Addendum)
She has had recurrent visits at the [facility] and at the ED for lower abdominal pain which could certainly be caused by an adnexal mass. She was seen by Dr. 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  -Referred to GYN -Ordered transvaginal/pelvic US

## 2013-09-19 NOTE — Assessment & Plan Note (Addendum)
Referral for mammogram - pt had BI-RADS CATEGORY 3 with mammogram of 2010 with recommendation for repeat imaging in 6 months but she never followed up.

## 2013-09-19 NOTE — Assessment & Plan Note (Signed)
Patient assures compliance with her statin, has not had lipid panel in >45yr and requests that this be checked today.  -Ordered lipid panel

## 2013-09-19 NOTE — Assessment & Plan Note (Signed)
Patient with AKI in November 2014 with Creatinine of 1.2 per ISTAT, her baseline creatinine if 0.8. She never have repeat labs for follow up. Checked BMET today.

## 2013-09-19 NOTE — Patient Instructions (Addendum)
-  Your results will be ready on Friday.  -For the facial rash, avoid using make up or creams. Use a mild gentle soap to wash your face. You may use an over-the-counter hydrocortisone cream if it does not improve.  -You have been referred to a pelvic/transvaginal ultrasound and to a gynecologist. It is very important that you keep these appointment.  -You have been referred to your mammogram.  -Follow up with your primary care doctor, Dr. Algis Liming in one month.   S-us Resultados estarn listos el viernes. -Para La erupcin facial, evitar el uso de maquillaje o cremas. Use un jabn suave para lavar su cara. Usted puede usar una crema de hidrocortisona de venta libre si no mejora. -Usted Ha sido referido a un ultrasonido plvico / transvaginal y con Copy. Es muy importante que usted mantenga stos cita. -Usted Ha sido referido para su mamografa. -Seguir Con su mdico de cabecera, el doctor [employer] [facility] un mes.

## 2013-09-19 NOTE — Progress Notes (Signed)
Subjective:    Patient ID: Karen [name], female    DOB: 1965/09/19, 48 y.o.   MRN: [id number]  Rash Associated symptoms include shortness of breath. Pertinent negatives include no cough, diarrhea, fatigue, fever or vomiting.  Vaginal Discharge The patient's primary symptoms include a vaginal discharge. The patient's pertinent negatives include no pelvic pain. Associated symptoms include abdominal pain, nausea and rash. Pertinent negatives include no chills, constipation, diarrhea, dysuria, fever, headaches, urgency or vomiting.  Chest Pain  Associated symptoms include abdominal pain, nausea and shortness of breath. Pertinent negatives include no cough, diaphoresis, dizziness, fever, headaches, palpitations, vomiting or weakness.   Karen [name] is a Spanish-speaking woman with PMH significant for MDD, anxiety, and recurrent abdominal pain who presents today, accompanied by her boyfriend and by the Spanish interpreter for evaluation of vaginal discharge x 1 week. She has had a white/clear vaginal discharge with occasional itch for the past week. The discharge is sometimes malodorous. She has tried an OTC vaginal cream for the past two days with some improvement of her symptoms. She also has abdominal pain that has been present for months.   She has been sexually active with one female partner for the past year. She notes that she has decreased libido recently and is very concerned about at this and it has affected her relationship to the point that her partner suspects that she has another sexual partner. She has not history of STI and her partner has no symptoms of STI.   She also complains of chest pain that has been present for months. The pain is described as an ache, 7/10 at times that is present every day but comes and goes, present in her left chest, sometimes radiating to her left arm but not associated with N/V, SOB, or diaphoresis.   She also has a facial rash in her  bilateral nasolabial folds present for almost two weeks. Denies recent changes in her lotions/creams/or make up.   Review of Systems  Constitutional: Negative for fever, chills, diaphoresis, activity change, appetite change and fatigue.  Respiratory: Positive for shortness of breath. Negative for cough and wheezing.        Has occasional SOB due to reactive airway disease that responds to albuterol inhaler.   Cardiovascular: Positive for chest pain. Negative for palpitations and leg swelling.  Gastrointestinal: Positive for nausea and abdominal pain. Negative for vomiting, diarrhea and constipation.  Genitourinary: Positive for vaginal discharge. Negative for dysuria, urgency, vaginal bleeding, genital sores, vaginal pain and pelvic pain.  Skin: Positive for rash.       Facial redness  Neurological: Negative for dizziness, weakness, light-headedness and headaches.  Psychiatric/Behavioral: Negative for behavioral problems and agitation.       Objective:   Physical Exam  Nursing note and vitals reviewed. Constitutional: She is oriented to person, place, and time. She appears well-nourished. No distress.  Well groomed  HENT:  Bilateral redness of the nasolabial fold with no edema, papules/vesicles, or excoriation, non tender to palpation.   Cardiovascular: Regular rhythm.   No murmur heard. Mild bradycardia  Pulmonary/Chest: Effort normal and breath sounds normal. No respiratory distress. She has no wheezes. She has no rales. She exhibits tenderness.  TTP over the left 4th rib, pain is not worsened by arm flexion/rotation/extension.   Abdominal: Soft. Bowel sounds are normal. She exhibits no distension and no mass. There is no tenderness. There is no rebound and no guarding.  Initial diffuse TTP of abdomen that is not present with distracting maneuvers.  Genitourinary: Vagina normal. No vaginal discharge found.  Pelvic exam with normal vulva, no vaginal tear or lesion, scant white/clear  non odorous discharge at the vaginal cuff, no cervix visualized. No vaginal tenderness, no bleeding. Mild TTP of left and right adnexa but no palpable mass.   Musculoskeletal: Normal range of motion. She exhibits tenderness. She exhibits no edema.  TTP of chest wall   Neurological: She is alert and oriented to person, place, and time. No cranial nerve deficit. Coordination normal.  Skin: Skin is warm and dry. No rash noted. She is not diaphoretic. There is erythema. No pallor.  Psychiatric: She has a normal mood and affect. Her behavior is normal.          Assessment & Plan:

## 2013-09-19 NOTE — Assessment & Plan Note (Addendum)
No obvious discharge on physical exam. It is possible that she had an yeast infection that has been successfully treated with this OTC vaginal cream (pt does not recall name of this cream) Pelvic exam performed.  -Ordered wet prep, GC/Chlamydia, UA, HIV antibody -Pt will continue using her OTC vaginal cream at this time.  -Counseled pt and her partner that if she is positive for STI, both of them should be treated--her partner agreed.  Screening for HIV --nonreactive in 10/2011.

## 2013-09-20 ENCOUNTER — Telehealth: Payer: Self-pay | Admitting: Internal Medicine

## 2013-09-20 DIAGNOSIS — N760 Acute vaginitis: Principal | ICD-10-CM

## 2013-09-20 DIAGNOSIS — B9689 Other specified bacterial agents as the cause of diseases classified elsewhere: Secondary | ICD-10-CM

## 2013-09-20 LAB — CERVICOVAGINAL ANCILLARY ONLY
CHLAMYDIA, DNA PROBE: NEGATIVE
NEISSERIA GONORRHEA: NEGATIVE
WET PREP (BD AFFIRM): NEGATIVE
WET PREP (BD AFFIRM): POSITIVE — AB
Wet Prep (BD Affirm): NEGATIVE

## 2013-09-20 LAB — URINALYSIS, ROUTINE W REFLEX MICROSCOPIC
BILIRUBIN URINE: NEGATIVE
Glucose, UA: NEGATIVE mg/dL
Hgb urine dipstick: NEGATIVE
KETONES UR: NEGATIVE mg/dL
Leukocytes, UA: NEGATIVE
NITRITE: NEGATIVE
PROTEIN: NEGATIVE mg/dL
Specific Gravity, Urine: 1.02 (ref 1.005–1.030)
Urobilinogen, UA: 0.2 mg/dL (ref 0.0–1.0)
pH: 7 (ref 5.0–8.0)

## 2013-09-20 LAB — HIV ANTIBODY (ROUTINE TESTING W REFLEX): HIV: NONREACTIVE

## 2013-09-20 MED ORDER — METRONIDAZOLE 500 MG PO TABS: 500.0000 mg | ORAL_TABLET | Freq: Two times a day (BID) | ORAL | Status: DC

## 2013-09-20 NOTE — Telephone Encounter (Signed)
left message with pt's significant other explaining that she needs to take Flagyl 500mg  twice per day for 7 days and for her to avoid drinking alcohol while taking this medicine. He asked me if he needed to be treated as well and I explained to him that this was not an STI and he did no need treatment. He requested that the Rx be sent to the [facility] by [facility] and that he will pick it up and take it to the pt after he leaves work.   He was asked to call the clinic back with any questions.

## 2013-09-24 NOTE — Progress Notes (Signed)
INTERNAL MEDICINE TEACHING ATTENDING ADDENDUM - Leshon Armistead, DO: I reviewed and discussed at the time of visit with the resident Dr. Kennerly, the patient's medical history, physical examination, diagnosis and results of tests and treatment and I agree with the patient's care as documented. 

## 2013-09-27 ENCOUNTER — Ambulatory Visit (HOSPITAL_COMMUNITY)
Admission: RE | Admit: 2013-09-27 | Discharge: 2013-09-27 | Disposition: A | Discharge status: Home / Self Care | Payer: No Typology Code available for payment source | Source: Ambulatory Visit | Attending: Internal Medicine | Admitting: Internal Medicine

## 2013-09-27 ENCOUNTER — Other Ambulatory Visit: Payer: Self-pay | Admitting: Internal Medicine

## 2013-09-27 DIAGNOSIS — Z1231 Encounter for screening mammogram for malignant neoplasm of breast: Secondary | ICD-10-CM

## 2013-09-27 DIAGNOSIS — Z90710 Acquired absence of both cervix and uterus: Secondary | ICD-10-CM | POA: Insufficient documentation

## 2013-09-27 DIAGNOSIS — Z1239 Encounter for other screening for malignant neoplasm of breast: Secondary | ICD-10-CM

## 2013-09-27 DIAGNOSIS — N9489 Other specified conditions associated with female genital organs and menstrual cycle: Secondary | ICD-10-CM

## 2013-10-29 ENCOUNTER — Encounter (HOSPITAL_COMMUNITY): Payer: Self-pay | Admitting: Emergency Medicine

## 2013-10-29 DIAGNOSIS — R112 Nausea with vomiting, unspecified: Secondary | ICD-10-CM | POA: Insufficient documentation

## 2013-10-29 DIAGNOSIS — Z90710 Acquired absence of both cervix and uterus: Secondary | ICD-10-CM | POA: Insufficient documentation

## 2013-10-29 DIAGNOSIS — Z8719 Personal history of other diseases of the digestive system: Secondary | ICD-10-CM | POA: Insufficient documentation

## 2013-10-29 DIAGNOSIS — F329 Major depressive disorder, single episode, unspecified: Secondary | ICD-10-CM | POA: Insufficient documentation

## 2013-10-29 DIAGNOSIS — Z3202 Encounter for pregnancy test, result negative: Secondary | ICD-10-CM | POA: Insufficient documentation

## 2013-10-29 DIAGNOSIS — F411 Generalized anxiety disorder: Secondary | ICD-10-CM | POA: Insufficient documentation

## 2013-10-29 DIAGNOSIS — T7411XA Adult physical abuse, confirmed, initial encounter: Secondary | ICD-10-CM | POA: Insufficient documentation

## 2013-10-29 DIAGNOSIS — Z79899 Other long term (current) drug therapy: Secondary | ICD-10-CM | POA: Insufficient documentation

## 2013-10-29 DIAGNOSIS — I10 Essential (primary) hypertension: Secondary | ICD-10-CM | POA: Insufficient documentation

## 2013-10-29 DIAGNOSIS — G8929 Other chronic pain: Secondary | ICD-10-CM | POA: Insufficient documentation

## 2013-10-29 DIAGNOSIS — R51 Headache: Secondary | ICD-10-CM | POA: Insufficient documentation

## 2013-10-29 DIAGNOSIS — R0602 Shortness of breath: Secondary | ICD-10-CM | POA: Insufficient documentation

## 2013-10-29 DIAGNOSIS — M549 Dorsalgia, unspecified: Secondary | ICD-10-CM | POA: Insufficient documentation

## 2013-10-29 DIAGNOSIS — R1013 Epigastric pain: Secondary | ICD-10-CM | POA: Insufficient documentation

## 2013-10-29 DIAGNOSIS — G43909 Migraine, unspecified, not intractable, without status migrainosus: Secondary | ICD-10-CM | POA: Insufficient documentation

## 2013-10-29 DIAGNOSIS — F3289 Other specified depressive episodes: Secondary | ICD-10-CM | POA: Insufficient documentation

## 2013-10-29 DIAGNOSIS — R0789 Other chest pain: Secondary | ICD-10-CM | POA: Insufficient documentation

## 2013-10-29 NOTE — ED Notes (Signed)
Presents with headache for 4 days associated with abdominla pain, nausea and light senistivity. No relief from hydrocodone, ibuprofen or acetaminophen. Reports dx of migraines in past but has not had one for a long time.  Alert, oriented and MAE x4. Neurologically intact.

## 2013-10-30 ENCOUNTER — Emergency Department (HOSPITAL_COMMUNITY)
Admission: EM | Admit: 2013-10-30 | Discharge: 2013-10-30 | Disposition: A | Discharge status: Home / Self Care | Payer: No Typology Code available for payment source | Attending: Emergency Medicine | Admitting: Emergency Medicine

## 2013-10-30 ENCOUNTER — Emergency Department (HOSPITAL_COMMUNITY): Payer: No Typology Code available for payment source

## 2013-10-30 DIAGNOSIS — IMO0002 Reserved for concepts with insufficient information to code with codable children: Secondary | ICD-10-CM

## 2013-10-30 DIAGNOSIS — R1013 Epigastric pain: Secondary | ICD-10-CM

## 2013-10-30 DIAGNOSIS — R519 Headache, unspecified: Secondary | ICD-10-CM

## 2013-10-30 DIAGNOSIS — R112 Nausea with vomiting, unspecified: Secondary | ICD-10-CM

## 2013-10-30 DIAGNOSIS — R51 Headache: Secondary | ICD-10-CM

## 2013-10-30 HISTORY — DX: Migraine, unspecified, not intractable, without status migrainosus: G43.909

## 2013-10-30 LAB — URINALYSIS, ROUTINE W REFLEX MICROSCOPIC
BILIRUBIN URINE: NEGATIVE
Glucose, UA: NEGATIVE mg/dL
HGB URINE DIPSTICK: NEGATIVE
Ketones, ur: NEGATIVE mg/dL
Leukocytes, UA: NEGATIVE
Nitrite: NEGATIVE
Protein, ur: NEGATIVE mg/dL
Specific Gravity, Urine: 1.024 (ref 1.005–1.030)
UROBILINOGEN UA: 2 mg/dL — AB (ref 0.0–1.0)
pH: 6 (ref 5.0–8.0)

## 2013-10-30 LAB — HEPATIC FUNCTION PANEL
ALK PHOS: 41 U/L (ref 39–117)
ALT: 11 U/L (ref 0–35)
AST: 19 U/L (ref 0–37)
Albumin: 3.8 g/dL (ref 3.5–5.2)
Bilirubin, Direct: <0.2 mg/dL (ref 0.0–0.3)
TOTAL PROTEIN: 6.4 g/dL (ref 6.0–8.3)
Total Bilirubin: <0.2 mg/dL — ABNORMAL LOW (ref 0.3–1.2)

## 2013-10-30 LAB — I-STAT CHEM 8, ED
BUN: 23 mg/dL (ref 6–23)
CALCIUM ION: 1.13 mmol/L (ref 1.12–1.23)
CHLORIDE: 110 meq/L (ref 96–112)
Creatinine, Ser: 1 mg/dL (ref 0.50–1.10)
GLUCOSE: 86 mg/dL (ref 70–99)
HCT: 32 % — ABNORMAL LOW (ref 36.0–46.0)
Hemoglobin: 10.9 g/dL — ABNORMAL LOW (ref 12.0–15.0)
Potassium: 3.9 mEq/L (ref 3.7–5.3)
Sodium: 137 mEq/L (ref 137–147)
TCO2: 26 mmol/L (ref 0–100)

## 2013-10-30 LAB — CBC WITH DIFFERENTIAL/PLATELET
Basophils Absolute: 0 10*3/uL (ref 0.0–0.1)
Basophils Relative: 0 % (ref 0–1)
Eosinophils Absolute: 0.2 10*3/uL (ref 0.0–0.7)
Eosinophils Relative: 2 % (ref 0–5)
HCT: 31.6 % — ABNORMAL LOW (ref 36.0–46.0)
Hemoglobin: 10.3 g/dL — ABNORMAL LOW (ref 12.0–15.0)
LYMPHS PCT: 44 % (ref 12–46)
Lymphs Abs: 3.6 10*3/uL (ref 0.7–4.0)
MCH: 29.3 pg (ref 26.0–34.0)
MCHC: 32.6 g/dL (ref 30.0–36.0)
MCV: 89.8 fL (ref 78.0–100.0)
MONOS PCT: 7 % (ref 3–12)
Monocytes Absolute: 0.6 10*3/uL (ref 0.1–1.0)
NEUTROS ABS: 3.9 10*3/uL (ref 1.7–7.7)
Neutrophils Relative %: 47 % (ref 43–77)
Platelets: 304 10*3/uL (ref 150–400)
RBC: 3.52 MIL/uL — ABNORMAL LOW (ref 3.87–5.11)
RDW: 13.8 % (ref 11.5–15.5)
WBC: 8.2 10*3/uL (ref 4.0–10.5)

## 2013-10-30 LAB — LIPASE, BLOOD: Lipase: 76 U/L — ABNORMAL HIGH (ref 11–59)

## 2013-10-30 LAB — PREGNANCY, URINE: Preg Test, Ur: NEGATIVE

## 2013-10-30 LAB — I-STAT TROPONIN, ED: TROPONIN I, POC: 0 ng/mL (ref 0.00–0.08)

## 2013-10-30 MED ORDER — ONDANSETRON 4 MG PO TBDP
8.0000 mg | ORAL_TABLET | Freq: Once | ORAL | Status: AC
  Administered 2013-10-30: 8 mg via ORAL
  Filled 2013-10-30: qty 2

## 2013-10-30 MED ORDER — DIPHENHYDRAMINE HCL 50 MG/ML IJ SOLN
25.0000 mg | Freq: Once | INTRAMUSCULAR | Status: AC
  Administered 2013-10-30: 25 mg via INTRAVENOUS
  Filled 2013-10-30: qty 1

## 2013-10-30 MED ORDER — PROCHLORPERAZINE EDISYLATE 5 MG/ML IJ SOLN
10.0000 mg | Freq: Once | INTRAMUSCULAR | Status: AC
  Administered 2013-10-30: 10 mg via INTRAVENOUS
  Filled 2013-10-30: qty 2

## 2013-10-30 MED ORDER — IBUPROFEN 200 MG PO TABS
400.0000 mg | ORAL_TABLET | Freq: Once | ORAL | Status: AC
  Administered 2013-10-30: 400 mg via ORAL
  Filled 2013-10-30: qty 2

## 2013-10-30 MED ORDER — LORAZEPAM 2 MG/ML IJ SOLN
1.0000 mg | Freq: Once | INTRAMUSCULAR | Status: AC
Start: 2013-10-30 — End: 2013-10-30
  Administered 2013-10-30: 1 mg via INTRAVENOUS
  Filled 2013-10-30: qty 1

## 2013-10-30 MED ORDER — SODIUM CHLORIDE 0.9 % IV BOLUS (SEPSIS)
1000.0000 mL | Freq: Once | INTRAVENOUS | Status: AC
  Administered 2013-10-30: 1000 mL via INTRAVENOUS

## 2013-10-30 NOTE — ED Notes (Signed)
Patient states that her chest pain started in the waiting room, now states that she does have some shortness of breath with it.

## 2013-10-30 NOTE — ED Notes (Signed)
Back from xray.  Ambulated to the bathroom without difficulty

## 2013-10-30 NOTE — ED Notes (Signed)
Patient given food tray by food services.
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
## 2013-10-30 NOTE — ED Notes (Signed)
Social work at bedside with patient.

## 2013-10-30 NOTE — ED Notes (Signed)
Pt remains drowsy from prior medication this morning. Assessment counselor from shelter, having difficult time getting clear assessment of patient desires. Plan for patient to remains here until she awakes more then can be sent in taxi to family services for assessment. Social work, PA aware of plan as well as patient. Ordered pt meal tray and will move to POD C

## 2013-10-30 NOTE — ED Notes (Addendum)
Social Systems analyst at bedside. Then will cath patient.

## 2013-10-30 NOTE — ED Notes (Signed)
Patient walked around Pod C.

## 2013-10-30 NOTE — Discharge Planning (Signed)
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 Pt expressed no concerns with PCP, and states she was seen at the clinic about a month ago. My contact information was provided for any future questions or concerns.

## 2013-10-30 NOTE — ED Notes (Signed)
Ambulated patient to bathroom, reports she cannot go. Will try again in 15 mins.

## 2013-10-30 NOTE — ED Notes (Signed)
Pt reports "does not feel any better, feel the same", reports pain in abd, back and HA. Also nausea.

## 2013-10-30 NOTE — ED Notes (Signed)
Pt made aware of need for urine sample.  

## 2013-10-30 NOTE — Discharge Instructions (Signed)
Lea la informacin a continuacin. Usted puede volver a la sala de Environmental education officer en cualquier momento por el empeoramiento de la condicin o nuevos sntomas que le preocupan. Si usted desarrolla fiebre alta, empeoramiento del dolor abdominal, vmitos incontrolados, o es incapaz de Tree surgeon lquidos por va oral, vuelva a la sala de emergencia para un chequeo de nuevo. Usted est teniendo un dolor de [place]. No causa especfica fue encontrado hoy para su dolor de [place]. Puede haber sido una migraa u otra causa de dolor de [place]. El estrs, la ansiedad, la fatiga y la depresin son factores desencadenantes comunes de dolores de [place]. El dolor de cabeza hoy no parece estar en peligro la vida o requerir hospitalizacin, pero a menudo la causa exacta de los dolores de cabeza no se determina en el servicio de [place]. Por lo tanto, el seguimiento con su mdico es muy importante para saber lo que puede haber causado el dolor de [place], y si usted necesita cualquier prueba de diagnstico o Tax inspector. A veces, los dolores de cabeza pueden aparecer benignos (no perjudiciales), pero entonces los sntomas ms graves pueden desarrollar lo que debera dar lugar a una nueva evaluacin inmediata por parte de su mdico o al servicio de urgencias. BUSQUE ATENCIN MDICA SI: Usted desarrolla posibles problemas con los medicamentos prescritos. Los medicamentos no Scientist, forensic de cabeza, si se repite, o si tiene varios episodios de vmitos o no pueden tomar lquidos. Tiene un cambio desde el dolor de cabeza de costumbre. Volver inmediatamente si desarrolla un dolor de cabeza o confusin severa repentina, convertido poco sensible o desmayo, desarrollar una fiebre de ms de 100.4  F o problema de respiracin, tener un [employer] Corporation, la visin, la deglucin o la comprensin, o desarrollar una nueva debilidad, entumecimiento, hormigueo , falta de coordinacin, o tiene una convulsin.   Read the information  below.  You may return to the Emergency Department at any time for worsening condition or any new symptoms that concern you.  If you develop high fevers, worsening abdominal pain, uncontrolled vomiting, or are unable to tolerate fluids by mouth, return to the ER for a recheck.   You are having a headache. No specific cause was found today for your headache. It may have been a migraine or other cause of headache. Stress, anxiety, fatigue, and depression are common triggers for headaches. Your headache today does not appear to be life-threatening or require hospitalization, but often the exact cause of headaches is not determined in the emergency department. Therefore, follow-up with your doctor is very important to find out what may have caused your headache, and whether or not you need any further diagnostic testing or treatment. Sometimes headaches can appear benign (not harmful), but then more serious symptoms can develop which should prompt an immediate re-evaluation by your doctor or the emergency department. SEEK MEDICAL ATTENTION IF: You develop possible problems with medications prescribed.  The medications don't resolve your headache, if it recurs , or if you have multiple episodes of vomiting or can't take fluids. You have a change from the usual headache. RETURN IMMEDIATELY IF you develop a sudden, severe headache or confusion, become poorly responsive or faint, develop a fever above 100.52F or problem breathing, have a change in speech, vision, swallowing, or understanding, or develop new weakness, numbness, tingling, incoordination, or have a seizure.    Maltrato y de abandono (Abuse and Neglect) A continuacin se indican algunos signos y sntomas que pueden observarse en alguien que sufre [place].  Si piensa que algn ser querido es [place], busque ayuda de un profesional cercano. Usted puede ser el nico individuo que esa persona tiene que puede ayudarlo a escapar del maltrato del que l  mismo no puede protegerse. ABUSO FSICO es cuando se [place], se da una bofetada, se sacude, [place] o se daa fsicamente a Arts administrator. [employer] Corporation formas de abuso, el abuso fsico por lo general es contnuo por un Environmental education officer.  ABUSO SEXUAL es cuando una persona se ve en una situacin sexual sin su consentimiento. A veces esto significa contacto sexual directo, como Clinical biochemist, otro contacto genital o manoseo. Pero tambin puede significar que se obliga a la persona a Ambulance person, mirar los genitales de otra persona, ver pornografa o ser parte de la produccin de pornografa. Los nios y los ancianos muchas veces no se ven forzados a una situacin sexual, pero se los persuade, soborna, [place] o coacciona.  ABUSO EMOCIONAL/MENTAL es cuando una persona se ve amenazada con regularidad, se le grita, [place], [place], culpa o se [place] emocionalmente de [place]. Por ejemplo, burlarse de una persona, ponerle motes y Editor, commissioning siempre la culpa son formas de abuso emocional/mental. ABANDONO es cuando no se cumplen las necesidades bsicas de un nio. Esas necesidades incluyen alimentacin, proteccin, abrigo, ropa, limpieza, apoyo emocional, amor y [place], Medical illustrator, seguridad y cuidados mdicos y dentales. A continuacin se indican algunos signos y sntomas que pueden observarse en alguien que sufre maltrato o abandono. Si alguien que usted conoce tiene algunos de estos sntomas de abuso o abandono, pida ayuda a un profesional. Usted puede ser el nico individuo que esa persona tiene para ayudarla a escapar del maltrato del cual ella misma no puede protegerse. SIGNOS Y SNTOMAS DEL MALTRATO Y DE ABANDONO Nios  Cualquier lesin (hematomas, inflamacin, quemaduras, huesos rotos) que no tengan explicacin.  Quejas de dolor abdominal.  Agitacin, ansiedad, miedo, dudas al hablar.  Temor al [employer].  Ir de cuerpo u orinar con frecuencia o sin control.  Conductas  extremas (extraas, abandono, miedo, [employer]).  Miedo de volver a su casa; miedo a los [place].  Refiere lesiones por parte de los [place].  Ropa interior rota, manchada o con PPL Corporation.  Dolor o comezn en la zona genital.  Enfermedad venrea, especialmente en preadolescentes.  Embarazo.  Comportamiento o conocimiento sexual inusual.  El profesional que la asiste ha informado abuso sexual.  Falta de amigos, no quiere ir a Cytogeneticist.  Hambre permanente, higiene deficiente, vestimenta inapropiada.  Falta de supervisin. No est a cargo de Geologist, engineering.  Fatiga o apata constante.  Falta de atencin a las necesidades fsicas o mdicas.  Abandono.  Caries o dolor en los dientes.  Implorar y robar comida.  Prdida del cabello.  Consumo excesivo de alcohol o drogas.  Intento de suicidio. [place]  Cualquier lesin (hematomas, inflamacin, quemaduras, huesos rotos) que no tengan explicacin.  Informe de abuso sexual.  Actitudes denigrantes en pblico y en privado.  Actitud de [employer] Service[employer] Company opuesto.  Informe de lesiones por golpes.  Miedo de volver a su casa.  Cambios drsticos en la conducta en presencia del golpeador. Ancianos  Cualquier lesin (hematomas, inflamacin, quemaduras, huesos rotos) que no tengan explicacin.  Dificultad para caminar o sentarse.  Frecuentes ingresos al hospital o a los servicios de Multimedia programmer.  Historias poco crebles, ambivalencia, etc.  Retraimiento, fantasas o conducta infantil.  Sentimientos de [place], ansiedad.  Lesiones que no se han atendido Product manager.  Deshidratacin o desnutricin sin causa relacionada con una enfermedad.  Prdida de peso.  Ir de cuerpo u orinar con frecuencia o sin control.  Higiene corporal deficiente, ropa sucia.  Pruebas de administracin de medicamentos en forma inadecuada.  Agitacin, ansiedad, miedo, dudas al hablar. LOS PASOS QUE DEBE SEGUIR  Saque al nio del  hogar si siente que va a suceder un episodio violento. Conozca los signos que Administrator, arts. Varan con las situaciones, pero pueden incluir: consumo de alcohol, amenazas con armas, amenazas al Eli Lilly and Company, a usted mismo y a otros miembros de la familia o Copy, contacto sexual forzado, disculpas menos frecuentes.  Si usted o su hijo son atacados o golpeados, informe esto a la polica para que el maltrato se documente. Busque alguien en quien confiar y cuntele lo que le ocurre a usted o a su hijo. Es Network engineer al nio de una situacin de [place] lo antes posible. l no puede protegerse a s mismo y est en peligro.  Es importante tener un plan de seguridad en caso de que usted o su hijo sean amenazados:  Guarde en la casa de un amigo o un vecino alguna ropa extra para usted y su hijo, medicamentos, dinero, nmeros de telfono y [employer] International y un juego extra de llaves de su casa y del Academic librarian.  Dgale a algn amigo o familiar que lo apoya que usted podr llegar en cualquier momento del da o de la noche en caso de Freight forwarder.  Si no tiene un amigo o un familiar cercano, haga una lista de otros lugares seguros adonde pueda ir (refugios, centros de Clorox Company de crisis, etc.). Tenga a mano los Hilton Hotels de organizaciones de [place] en caso de [place]; puede ser de gran ayuda. Puede ser de [place] Band.  Muchas vctimas no se alejan de las situaciones de peligro debido a que no tienen dinero o trabajo. Planificar por adelantado puede ayudarlo para el futuro. Trate de Programme researcher, broadcasting/film/video. Mantenga su empleo o trate de conseguir uno. Si no puede conseguir empleo, trate de recibir el entrenamiento necesario para obtener uno. Los servicios sociales estn equipados para ayudarlo a usted y a su hijo. No permanezca ni deje a su hijo en una situacin de maltrato. Puede resultar fatal. NMEROS DE TELFONO PARA TENER A MANO:   Servicios sociales.  Refugios  locales.  Servicio de emergencias (911 en [place]). SOLICITE ATENCIN MDICA SI:  Usted o su hijo tienen nuevos trastornos debido a las lesiones.  Siente que Edison International peligro hacia usted o su hijo con respecto al Prien.  [place] DE INMEDIATO SI OBSERVA:  Siente que aumenta el peligro hacia usted o su hijo con respecto al Many. Llame al 911 o al 0 (operador). Cefalea migraosa (Migraine Headache) Una cefalea migraosa es un dolor intenso y punzante en uno o ambos lados de la cabeza. La migraa puede durar desde 30 minutos hasta varias horas. CAUSAS  No siempre se conoce la causa exacta de la cefalea migraosa. Sin embargo, Production assistant, radio NCR Corporation nervios del cerebro se irritan y liberan ciertas sustancias qumicas que causan inflamacin. Esto ocasiona dolor. Existen tambin ciertos factores que pueden desencadenar las migraas, como los siguientes: Alcohol. Fumar. Estrs. La menstruacin Quesos aejados. Los alimentos o las bebidas que contienen nitratos, glutamato, aspartamo o tiramina. Falta de sueo. Chocolate. Cafena. Hambre. Actividad fsica extenuante. Fatiga. Medicamentos que se usan para tratar Research officer, political party (nitroglicerina), pldoras anticonceptivas, estrgeno y algunos  medicamentos para la hipertensin arterial. SIGNOS Y SNTOMAS Dolor en uno o ambos lados de la cabeza. Dolor pulsante o punzante. Dolor intenso que impide Yahoo actividades diarias. Dolor que se agrava por cualquier actividad fsica. Nuseas, vmitos o ambos. Mareos. Dolor con la exposicin a las luces brillantes, a los ruidos fuertes o la [place]. Sensibilidad general a las luces brillantes, a los ruidos fuertes o a los Merck & Co. Antes de sufrir una migraa, puede recibir seales de advertencia (aura). Un aura puede incluir: Ver las luces intermitentes. Ver puntos brillantes, halos o lneas en zigzag. Tener una visin en tnel o visin  borrosa. Sensacin de entumecimiento u hormigueo. Dificultad para hablar Debilidad muscular. DIAGNSTICO  La cefalea migraosa se diagnostica en funcin de lo siguiente: Sntomas. Examen fsico. Ardelia Mems TC (tomografa computada) o resonancia magntica de la cabeza. Estas pruebas de diagnstico por imagen no pueden diagnosticar las migraas, pero pueden ayudar a Paramedic otras causas de las cefaleas. TRATAMIENTO Le prescribirn medicamentos para Best boy y las nuseas. Tambin podrn administrarse medicamentos para ayudar a Investment banker, corporate.  INSTRUCCIONES PARA EL CUIDADO EN EL HOGAR Slo tome medicamentos de venta libre o recetados para Glass blower/designer o Health and safety inspector, segn las indicaciones de su mdico. No se recomienda usar los opiceos a Barrister's clerk. Cuando tenga la migraa, acustese en un cuarto oscuro y tranquilo Lleve un registro diario para Neurosurgeon lo que puede provocar las Psychologist, occupational. Por ejemplo, escriba: Lo que usted come y bebe. Cunto tiempo duerme. Algn cambio en su dieta o en los medicamentos. Limite el consumo de bebidas alcohlicas. Si fuma, deje de hacerlo. Duerma entre 7 y 58horas, o segn las recomendaciones del mdico. Limite el estrs. [place] luces tenues si le [employer] Financial luces brillantes y la [place]. SOLICITE ATENCIN MDICA DE INMEDIATO SI:  La migraa se hace cada vez ms intensa. Tiene fiebre. Presenta rigidez en el cuello. Tiene prdida de visin. Presenta debilidad muscular o prdida del control muscular. Comienza a perder el equilibrio o tiene problemas para caminar. Sufre mareos o se desmaya. Tiene sntomas graves que son diferentes a los primeros sntomas. ASEGRESE DE QUE:  Comprende estas instrucciones. Controlar su afeccin. Recibir ayuda de inmediato si no mejora o si empeora. Document Released: 06/07/2005 Document Revised: 03/28/2013 [facility] Patient Information 2014 [place], [place].  Dolor  abdominal en adultos (Abdominal Pain, Adult) El dolor puede tener muchas causas. Normalmente la causa del dolor abdominal no es una enfermedad y Teacher, English as a foreign language sin Clinical research associate. Frecuentemente puede controlarse y tratarse en casa. Su mdico le Chartered certified accountant examen fsico y posiblemente solicite anlisis de sangre y radiografas para ayudar a Teacher, adult education la gravedad de su dolor. Sin embargo, en [employer]iant Energy, debe transcurrir ms tiempo antes de que se pueda Pension scheme manager una causa evidente del dolor. Antes de llegar a ese punto, es posible que su mdico no sepa si necesita ms pruebas o un tratamiento ms profundo. INSTRUCCIONES PARA EL CUIDADO EN EL HOGAR  Est atento al dolor para ver si hay cambios. Las siguientes indicaciones ayudarn a Chief Strategy Officer que pueda sentir: [place] solo medicamentos de venta libre o recetados, segn las indicaciones del mdico. No tome laxantes a menos que se lo haya indicado su mdico. Pruebe con Ardelia Mems dieta lquida absoluta (caldo, t o agua) segn se lo indique su mdico. Introduzca gradualmente una dieta normal, segn su tolerancia. SOLICITE ATENCIN MDICA SI: Tiene dolor abdominal sin explicacin. Tiene dolor abdominal relacionado con nuseas o diarrea. Tiene dolor cuando orina o defeca.  Experimenta dolor abdominal que lo despierta de noche. Tiene dolor abdominal que empeora o mejora cuando come alimentos. Tiene dolor abdominal que empeora cuando come alimentos grasosos. SOLICITE ATENCIN MDICA DE INMEDIATO SI:  El dolor no desaparece en un plazo mximo de 2horas. Tiene fiebre. No deja de (vomitar). El Social research officer, government se siente solo en partes del abdomen, como el lado derecho o la parte inferior izquierda del abdomen. Evaca materia fecal sanguinolenta o negra, de aspecto alquitranado. ASEGRESE DE QUE: Comprende estas instrucciones. Controlar su afeccin. Recibir ayuda de inmediato si no mejora o si empeora. Document Released: 06/07/2005 Document Revised:  03/28/2013 [facility] Patient Information 2014  [place], [place].   Recibe nuevas lesiones. Document Released: 09/22/2006 Document Revised: 08/30/2011 [facility] Patient Information 2014 [place], [place].

## 2013-11-01 ENCOUNTER — Telehealth: Payer: Self-pay | Admitting: *Deleted

## 2013-11-01 ENCOUNTER — Encounter: Payer: Self-pay | Admitting: Obstetrics & Gynecology

## 2013-11-01 ENCOUNTER — Encounter: Payer: Self-pay | Admitting: *Deleted

## 2013-11-01 NOTE — ED Provider Notes (Signed)
Medical screening examination/treatment/procedure(s) were performed by non-physician practitioner and as supervising physician I was immediately available for consultation/collaboration.   EKG Interpretation None        Mervin Kung, MD 11/01/13 [phone]

## 2013-11-01 NOTE — Telephone Encounter (Signed)
Message copied by Mitchell Heir on Thu Nov 01, 2013  2:11 PM ------      Message from: Woodroe Mode      Created: Thu Nov 01, 2013 11:05 AM       Had normal [place] no mass 4/9. Notify patient and can opt to not keep appt as no pelvic mass was seen ------
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
## 2013-11-05 NOTE — Telephone Encounter (Signed)
Telephone message opened in error. Yvonna Alanis, RN, noted 11/05/13 at 9PM

## 2013-11-16 ENCOUNTER — Encounter: Payer: Self-pay | Admitting: General Practice

## 2013-11-29 ENCOUNTER — Other Ambulatory Visit: Payer: Self-pay | Admitting: Internal Medicine

## 2013-12-03 NOTE — Telephone Encounter (Signed)
Klonopin and Ambien rxs faxed to [facility].

## 2013-12-17 ENCOUNTER — Encounter: Payer: Self-pay | Admitting: General Practice

## 2013-12-31 ENCOUNTER — Ambulatory Visit: Payer: Self-pay

## 2014-01-08 ENCOUNTER — Other Ambulatory Visit: Payer: Self-pay | Admitting: Internal Medicine

## 2014-01-11 ENCOUNTER — Telehealth: Payer: Self-pay | Admitting: *Deleted

## 2014-01-11 NOTE — Telephone Encounter (Signed)
Dr Algis Liming in clinic today - please refer to her for disposition

## 2014-01-11 NOTE — Telephone Encounter (Signed)
Per discussion w/ dr Algis Liming, will call in 1 month each of zolpidem and clonazepam

## 2014-01-11 NOTE — Telephone Encounter (Signed)
Pharm states zolpidem and clonazepam have been filled by dr Algis Liming since early 2014. Please advise

## 2014-01-12 MED ORDER — ZOLPIDEM TARTRATE 5 MG PO TABS: 5.0000 mg | ORAL_TABLET | Freq: Every day | ORAL | Status: DC

## 2014-01-12 MED ORDER — CLONAZEPAM 1 MG PO TABS: 1.0000 mg | ORAL_TABLET | Freq: Every day | ORAL | Status: DC
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
## 2014-01-24 ENCOUNTER — Telehealth: Payer: Self-pay | Admitting: *Deleted

## 2014-01-24 NOTE — Telephone Encounter (Signed)
Pt request Proventil inhaler.  This was taken off the med list. Do you want to restart?

## 2014-01-25 NOTE — Telephone Encounter (Signed)
Pt has not needed this medication since 2012 and has had no complaints of SOB or asthma like symptoms to this provider. Therefore don't need to restart. If pt is having new symptoms should be evaluated.   Thanks.

## 2014-01-28 NOTE — Telephone Encounter (Signed)
Pharmacy informed.

## 2014-02-08 ENCOUNTER — Other Ambulatory Visit: Payer: Self-pay | Admitting: Internal Medicine

## 2014-02-14 NOTE — Telephone Encounter (Signed)
Klonopin and Zolpidem rxs called to [facility].

## 2014-03-04 ENCOUNTER — Emergency Department (HOSPITAL_COMMUNITY)
Admission: EM | Admit: 2014-03-04 | Discharge: 2014-03-04 | Disposition: A | Discharge status: Home / Self Care | Payer: Self-pay | Attending: Emergency Medicine | Admitting: Emergency Medicine

## 2014-03-04 ENCOUNTER — Encounter (HOSPITAL_COMMUNITY): Payer: Self-pay | Admitting: Emergency Medicine

## 2014-03-04 DIAGNOSIS — Z8719 Personal history of other diseases of the digestive system: Secondary | ICD-10-CM | POA: Insufficient documentation

## 2014-03-04 DIAGNOSIS — R1013 Epigastric pain: Secondary | ICD-10-CM | POA: Insufficient documentation

## 2014-03-04 DIAGNOSIS — IMO0002 Reserved for concepts with insufficient information to code with codable children: Secondary | ICD-10-CM | POA: Insufficient documentation

## 2014-03-04 DIAGNOSIS — F3289 Other specified depressive episodes: Secondary | ICD-10-CM | POA: Insufficient documentation

## 2014-03-04 DIAGNOSIS — R112 Nausea with vomiting, unspecified: Secondary | ICD-10-CM | POA: Insufficient documentation

## 2014-03-04 DIAGNOSIS — M5416 Radiculopathy, lumbar region: Secondary | ICD-10-CM

## 2014-03-04 DIAGNOSIS — I10 Essential (primary) hypertension: Secondary | ICD-10-CM | POA: Insufficient documentation

## 2014-03-04 DIAGNOSIS — F329 Major depressive disorder, single episode, unspecified: Secondary | ICD-10-CM | POA: Insufficient documentation

## 2014-03-04 DIAGNOSIS — Z90710 Acquired absence of both cervix and uterus: Secondary | ICD-10-CM | POA: Insufficient documentation

## 2014-03-04 DIAGNOSIS — M549 Dorsalgia, unspecified: Secondary | ICD-10-CM | POA: Insufficient documentation

## 2014-03-04 DIAGNOSIS — G8929 Other chronic pain: Secondary | ICD-10-CM | POA: Insufficient documentation

## 2014-03-04 DIAGNOSIS — F411 Generalized anxiety disorder: Secondary | ICD-10-CM | POA: Insufficient documentation

## 2014-03-04 DIAGNOSIS — G43909 Migraine, unspecified, not intractable, without status migrainosus: Secondary | ICD-10-CM | POA: Insufficient documentation

## 2014-03-04 DIAGNOSIS — R197 Diarrhea, unspecified: Secondary | ICD-10-CM

## 2014-03-04 LAB — COMPREHENSIVE METABOLIC PANEL
ALK PHOS: 61 U/L (ref 39–117)
ALT: 23 U/L (ref 0–35)
ANION GAP: 13 (ref 5–15)
AST: 22 U/L (ref 0–37)
Albumin: 3.9 g/dL (ref 3.5–5.2)
BILIRUBIN TOTAL: 0.3 mg/dL (ref 0.3–1.2)
BUN: 17 mg/dL (ref 6–23)
CO2: 22 mEq/L (ref 19–32)
Calcium: 8.9 mg/dL (ref 8.4–10.5)
Chloride: 103 mEq/L (ref 96–112)
Creatinine, Ser: 0.82 mg/dL (ref 0.50–1.10)
GFR, EST NON AFRICAN AMERICAN: 83 mL/min — AB (ref >90)
Glucose, Bld: 83 mg/dL (ref 70–99)
POTASSIUM: 4.2 meq/L (ref 3.7–5.3)
Sodium: 138 mEq/L (ref 137–147)
Total Protein: 6.9 g/dL (ref 6.0–8.3)

## 2014-03-04 LAB — CBC WITH DIFFERENTIAL/PLATELET
Basophils Absolute: 0 10*3/uL (ref 0.0–0.1)
Basophils Relative: 0 % (ref 0–1)
Eosinophils Absolute: 0.1 10*3/uL (ref 0.0–0.7)
Eosinophils Relative: 2 % (ref 0–5)
HEMATOCRIT: 36 % (ref 36.0–46.0)
Hemoglobin: 11.8 g/dL — ABNORMAL LOW (ref 12.0–15.0)
LYMPHS PCT: 26 % (ref 12–46)
Lymphs Abs: 2 10*3/uL (ref 0.7–4.0)
MCH: 29 pg (ref 26.0–34.0)
MCHC: 32.8 g/dL (ref 30.0–36.0)
MCV: 88.5 fL (ref 78.0–100.0)
MONO ABS: 0.4 10*3/uL (ref 0.1–1.0)
MONOS PCT: 6 % (ref 3–12)
NEUTROS ABS: 5.2 10*3/uL (ref 1.7–7.7)
NEUTROS PCT: 66 % (ref 43–77)
Platelets: 270 10*3/uL (ref 150–400)
RBC: 4.07 MIL/uL (ref 3.87–5.11)
RDW: 12.9 % (ref 11.5–15.5)
WBC: 7.7 10*3/uL (ref 4.0–10.5)

## 2014-03-04 LAB — LIPASE, BLOOD: Lipase: 69 U/L — ABNORMAL HIGH (ref 11–59)

## 2014-03-04 LAB — URINALYSIS, ROUTINE W REFLEX MICROSCOPIC
Bilirubin Urine: NEGATIVE
GLUCOSE, UA: NEGATIVE mg/dL
Hgb urine dipstick: NEGATIVE
KETONES UR: NEGATIVE mg/dL
LEUKOCYTES UA: NEGATIVE
Nitrite: NEGATIVE
Protein, ur: NEGATIVE mg/dL
Specific Gravity, Urine: 1.016 (ref 1.005–1.030)
Urobilinogen, UA: 0.2 mg/dL (ref 0.0–1.0)
pH: 5 (ref 5.0–8.0)

## 2014-03-04 MED ORDER — MORPHINE SULFATE 4 MG/ML IJ SOLN
4.0000 mg | Freq: Once | INTRAMUSCULAR | Status: AC
  Administered 2014-03-04: 4 mg via INTRAMUSCULAR
  Filled 2014-03-04: qty 1

## 2014-03-04 MED ORDER — HYDROCODONE-ACETAMINOPHEN 5-325 MG PO TABS
2.0000 | ORAL_TABLET | Freq: Once | ORAL | Status: AC
  Administered 2014-03-04: 2 via ORAL
  Filled 2014-03-04: qty 2

## 2014-03-04 MED ORDER — PREDNISONE 20 MG PO TABS: 60.0000 mg | ORAL_TABLET | Freq: Every day | ORAL | Status: DC

## 2014-03-04 MED ORDER — HYDROCODONE-ACETAMINOPHEN 5-325 MG PO TABS: ORAL_TABLET | ORAL | Status: DC

## 2014-03-04 MED ORDER — METHOCARBAMOL 500 MG PO TABS: 1000.0000 mg | ORAL_TABLET | Freq: Four times a day (QID) | ORAL | Status: DC | PRN

## 2014-03-04 MED ORDER — PREDNISONE 20 MG PO TABS
50.0000 mg | ORAL_TABLET | Freq: Every day | ORAL | Status: DC
  Administered 2014-03-04: 50 mg via ORAL
  Filled 2014-03-04: qty 3

## 2014-03-04 MED ORDER — ONDANSETRON 4 MG PO TBDP
4.0000 mg | ORAL_TABLET | Freq: Once | ORAL | Status: AC
  Administered 2014-03-04: 4 mg via ORAL
  Filled 2014-03-04: qty 1

## 2014-03-04 MED ORDER — PROMETHAZINE HCL 25 MG PO TABS: 25.0000 mg | ORAL_TABLET | Freq: Four times a day (QID) | ORAL | Status: DC | PRN

## 2014-03-04 MED ORDER — METHOCARBAMOL 500 MG PO TABS
1000.0000 mg | ORAL_TABLET | Freq: Once | ORAL | Status: AC
  Administered 2014-03-04: 1000 mg via ORAL
  Filled 2014-03-04: qty 2

## 2014-03-04 NOTE — ED Notes (Signed)
Ambulated pt in the hallway. Pt did very well. Pt complained of left leg pain. RN notified

## 2014-03-04 NOTE — ED Provider Notes (Signed)
Medical screening examination/treatment/procedure(s) were performed by non-physician practitioner and as supervising physician I was immediately available for consultation/collaboration.  Richarda Blade, MD 03/04/14 [phone]

## 2014-03-04 NOTE — Discharge Instructions (Signed)
Tambin puede tomar Tylenol (acetaminofeno) 975mg  (esto es 3 sobre las pldoras de venta libre) cuatro veces al Training and development officer. No tome bebidas alcohlicas o combinado con otros medicamentos que contienen acetaminofeno como ingrediente (Lea las etiquetas!).  Para el dolor irruptivo usted puede tomar Robaxin. No beba alcohol, conducir o manejar maquinaria pesada al tomar Robaxin.  Tome vicodin para el dolor irruptivo, no beber alcohol, conducir, el cuidado de los nios o hacer otras tareas crticas mientras tomo vicodin.  Empuje fluidos: tomar pequeos sorbos frecuentes de agua o Gatorade, no beber cualquier soda, jugo o bebidas con cafena.  Reanudar lentamente dieta slida si lo deseas. Evite los alimentos que son picantes, contienen productos lcteos y / o tienen alto contenido de Lobbyist.  Aviod AINE (aspirina, Motrin, ibuprofeno, naproxeno, Aleve etctera) para el control del dolor, ya que Teacher, adult education.  Por favor, siga con su mdico de atencin primaria en los prximos 2 das para un chequeo. Deben obtener los registros para Nutritional therapist.  No dudara en volver a la sala de urgencias para cualquier nuevo empeoramiento ni respecto de los sntomas.  You can also take  tylenol (acetaminophen) 975mg  (this is 3 over the counter pills) four times a day. Do not drink alcohol or combine with other medications that have acetaminophen as an ingredient (Read the labels!).    For breakthrough pain you may take Robaxin. Do not drink alcohol, drive or operate heavy machinery when taking Robaxin.  Take vicodin for breakthrough pain, do not drink alcohol, drive, care for children or do other critical tasks while taking vicodin.  Push fluids: take small frequent sips of water or Gatorade, do not drink any soda, juice or caffeinated beverages.    Slowly resume solid diet as desired. Avoid food that are spicy, contain dairy and/or have high fat content.  Aviod NSAIDs (aspirin, motrin, ibuprofen, naproxen,  Aleve et Ronney Asters) for pain control because they will irritate your stomach.  Please follow with your primary care doctor in the next 2 days for a check-up. They must obtain records for further management.   Do not hesitate to return to the Emergency Department for any new, worsening or concerning symptoms.
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
## 2014-03-04 NOTE — ED Notes (Signed)
For 1 month strong pain in lower back; has continued for 4 days pain has gotten worse, radiates to right leg and also cannot lay down or sit down. Has nausea and abdominal pain, reports vomiting (2 x today)  and diarrhea (yesterday).

## 2014-03-26 ENCOUNTER — Emergency Department (HOSPITAL_COMMUNITY): Payer: Self-pay

## 2014-03-26 ENCOUNTER — Encounter (HOSPITAL_COMMUNITY): Payer: Self-pay | Admitting: Emergency Medicine

## 2014-03-26 ENCOUNTER — Emergency Department (HOSPITAL_COMMUNITY)
Admission: EM | Admit: 2014-03-26 | Discharge: 2014-03-26 | Disposition: A | Discharge status: Home / Self Care | Payer: Self-pay | Attending: Emergency Medicine | Admitting: Emergency Medicine

## 2014-03-26 DIAGNOSIS — F419 Anxiety disorder, unspecified: Secondary | ICD-10-CM | POA: Insufficient documentation

## 2014-03-26 DIAGNOSIS — I10 Essential (primary) hypertension: Secondary | ICD-10-CM | POA: Insufficient documentation

## 2014-03-26 DIAGNOSIS — R0789 Other chest pain: Secondary | ICD-10-CM | POA: Insufficient documentation

## 2014-03-26 DIAGNOSIS — G43909 Migraine, unspecified, not intractable, without status migrainosus: Secondary | ICD-10-CM | POA: Insufficient documentation

## 2014-03-26 DIAGNOSIS — Z79899 Other long term (current) drug therapy: Secondary | ICD-10-CM | POA: Insufficient documentation

## 2014-03-26 DIAGNOSIS — G8929 Other chronic pain: Secondary | ICD-10-CM | POA: Insufficient documentation

## 2014-03-26 DIAGNOSIS — M549 Dorsalgia, unspecified: Secondary | ICD-10-CM | POA: Insufficient documentation

## 2014-03-26 DIAGNOSIS — F329 Major depressive disorder, single episode, unspecified: Secondary | ICD-10-CM | POA: Insufficient documentation

## 2014-03-26 LAB — CBC WITH DIFFERENTIAL/PLATELET
BASOS PCT: 0 % (ref 0–1)
Basophils Absolute: 0 10*3/uL (ref 0.0–0.1)
EOS ABS: 0.1 10*3/uL (ref 0.0–0.7)
Eosinophils Relative: 1 % (ref 0–5)
HCT: 35.1 % — ABNORMAL LOW (ref 36.0–46.0)
Hemoglobin: 11.7 g/dL — ABNORMAL LOW (ref 12.0–15.0)
Lymphocytes Relative: 36 % (ref 12–46)
Lymphs Abs: 4.1 10*3/uL — ABNORMAL HIGH (ref 0.7–4.0)
MCH: 30.2 pg (ref 26.0–34.0)
MCHC: 33.3 g/dL (ref 30.0–36.0)
MCV: 90.7 fL (ref 78.0–100.0)
Monocytes Absolute: 0.7 10*3/uL (ref 0.1–1.0)
Monocytes Relative: 6 % (ref 3–12)
NEUTROS PCT: 57 % (ref 43–77)
Neutro Abs: 6.3 10*3/uL (ref 1.7–7.7)
PLATELETS: 246 10*3/uL (ref 150–400)
RBC: 3.87 MIL/uL (ref 3.87–5.11)
RDW: 13.7 % (ref 11.5–15.5)
WBC: 11.2 10*3/uL — ABNORMAL HIGH (ref 4.0–10.5)

## 2014-03-26 LAB — COMPREHENSIVE METABOLIC PANEL
ALBUMIN: 3.6 g/dL (ref 3.5–5.2)
ALT: 20 U/L (ref 0–35)
ANION GAP: 13 (ref 5–15)
AST: 17 U/L (ref 0–37)
Alkaline Phosphatase: 66 U/L (ref 39–117)
BUN: 16 mg/dL (ref 6–23)
CO2: 23 mEq/L (ref 19–32)
Calcium: 8.5 mg/dL (ref 8.4–10.5)
Chloride: 102 mEq/L (ref 96–112)
Creatinine, Ser: 0.73 mg/dL (ref 0.50–1.10)
GFR calc Af Amer: >90 mL/min (ref >90)
GFR calc non Af Amer: >90 mL/min (ref >90)
Glucose, Bld: 84 mg/dL (ref 70–99)
POTASSIUM: 3.8 meq/L (ref 3.7–5.3)
Sodium: 138 mEq/L (ref 137–147)
TOTAL PROTEIN: 6.4 g/dL (ref 6.0–8.3)
Total Bilirubin: 0.3 mg/dL (ref 0.3–1.2)

## 2014-03-26 LAB — TROPONIN I: Troponin I: <0.3 ng/mL (ref <0.30)

## 2014-03-26 MED ORDER — DIPHENHYDRAMINE HCL 50 MG/ML IJ SOLN
12.5000 mg | Freq: Once | INTRAMUSCULAR | Status: AC
  Administered 2014-03-26: 12.5 mg via INTRAVENOUS
  Filled 2014-03-26: qty 1

## 2014-03-26 MED ORDER — TRAMADOL HCL 50 MG PO TABS: 50.0000 mg | ORAL_TABLET | Freq: Four times a day (QID) | ORAL | Status: DC | PRN

## 2014-03-26 MED ORDER — HYDROXYZINE HCL 25 MG PO TABS: 25.0000 mg | ORAL_TABLET | Freq: Three times a day (TID) | ORAL | Status: DC | PRN

## 2014-03-26 MED ORDER — ALPRAZOLAM 0.25 MG PO TABS
0.2500 mg | ORAL_TABLET | Freq: Once | ORAL | Status: AC
  Administered 2014-03-26: 0.25 mg via ORAL
  Filled 2014-03-26: qty 1

## 2014-03-26 MED ORDER — HYDROMORPHONE HCL 1 MG/ML IJ SOLN
0.5000 mg | Freq: Once | INTRAMUSCULAR | Status: AC
  Administered 2014-03-26: 0.5 mg via INTRAVENOUS
  Filled 2014-03-26: qty 1

## 2014-03-26 NOTE — ED Notes (Signed)
C/o CP, anxiety and sob. Here from home by [facility], husband present. H/o anxiety, panic attack and chronic leg & back pain. Developed pain while walking down the street. Reports dog passed away 20d ago and remembering this made sx worse. Pt became upset ass she talked about this with EMS. Describes L sided CP as pressure w/o radiation, 10/10, no change after 1 sl NTG and ASA 324mg . NSL in place. sBP 132/ down to 118/ after 1 ntg. NSR on monitor.

## 2014-03-26 NOTE — ED Notes (Signed)
No changes, alert, NAD, calm, VSS. Pt asking for something for anxiety also, "tha is what I really want".

## 2014-03-26 NOTE — Discharge Instructions (Signed)
Dolor de pecho (no especfico) (Chest Pain (Nonspecific)) Suele ser difcil diagnosticar la causa del dolor de [place]. Siempre hay una posibilidad de que el dolor podra estar relacionado con algo grave, como un ataque al corazn o un cogulo sanguneo en los pulmones. Debe concurrir a las visitas de control con el mdico. CUIDADOS EN EL HOGAR  Si le dieron antibiticos, tmelos como se lo haya indicado el mdico. Finalice el medicamento, aunque comience a Sports administrator.  [address] no haga actividades que provoquen dolor de [place]. Contine con las actividades fsicas como se lo haya indicado el mdico.  No use productos que contengan tabaco, que incluyen cigarrillos, tabaco para Higher education careers adviser y Psychologist, sport and exercise.  Evite el consumo de alcohol.  Tome los medicamentos solamente como se lo haya indicado el mdico.  Siga las sugerencias del mdico en lo que respecta a ms pruebas, si el dolor de pecho no desaparece.  Concurra a todas las visitas que concert con el mdico. SOLICITE AYUDA SI:  El dolor de pecho no desaparece, incluso despus del tratamiento.  Tiene una erupcin cutnea con ampollas en el pecho.  Tiene fiebre. SOLICITE AYUDA DE INMEDIATO SI:   Aumenta el dolor de pecho o el dolor se irradia hacia el brazo, el cuello, la [place], la espalda o el vientre (abdomen).  Le falta el aire.  Tose ms de lo normal o tose con sangre.  Siente un dolor muy intenso en la espalda o el vientre.  Tiene malestar estomacal (nuseas) o vomita.  Se siente muy dbil.  Pierde el conocimiento (se desmaya).  Tiene escalofros. Esto es Engineer, maintenance (IT). No espere a ver que los problemas desaparezcan. Llame a los servicios de emergencia locales (911 en [place]). No conduzca por sus propios medios [employer] [facility]. ASEGRESE DE QUE:   Comprende estas instrucciones.  Controlar su afeccin.  Recibir ayuda de inmediato si no mejora o si empeora. Document  Released: 09/03/2008 Document Revised: 06/12/2013 [facility] Patient Information 2015 [place]. This information is not intended to replace advice given to you by your health care provider. Make sure you discuss any questions you have with your health care provider.

## 2014-03-26 NOTE — ED Notes (Signed)
"  feels better, calmer", "pain decreased, anxiety improved".

## 2014-03-26 NOTE — ED Notes (Signed)
Here by EMS, pt up to b/r upon arrival, family with pt, EKG delayed.

## 2014-03-26 NOTE — ED Notes (Signed)
Pt to xray, no changes, no dyspnea, alert, NAD, calm, VSS.

## 2014-03-26 NOTE — ED Notes (Signed)
C/o "feeling tired", anxious/ panicky, HA, sob, CP, back and leg pain, sob and nausea. Alert, NAD, calm, interactive, resps e/u, speaking in clear complete sentences, VSS.

## 2014-03-28 ENCOUNTER — Other Ambulatory Visit: Payer: Self-pay | Admitting: *Deleted

## 2014-03-28 MED ORDER — CLONAZEPAM 1 MG PO TABS: 1.0000 mg | ORAL_TABLET | Freq: Every day | ORAL | Status: DC

## 2014-03-28 NOTE — Telephone Encounter (Signed)
Rx called in to pharmacy. 
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
## 2014-04-04 ENCOUNTER — Emergency Department (HOSPITAL_COMMUNITY)
Admission: EM | Admit: 2014-04-04 | Discharge: 2014-04-04 | Disposition: A | Discharge status: Home / Self Care | Payer: Self-pay | Attending: Emergency Medicine | Admitting: Emergency Medicine

## 2014-04-04 ENCOUNTER — Encounter (HOSPITAL_COMMUNITY): Payer: Self-pay | Admitting: Emergency Medicine

## 2014-04-04 DIAGNOSIS — F329 Major depressive disorder, single episode, unspecified: Secondary | ICD-10-CM | POA: Insufficient documentation

## 2014-04-04 DIAGNOSIS — G43909 Migraine, unspecified, not intractable, without status migrainosus: Secondary | ICD-10-CM | POA: Insufficient documentation

## 2014-04-04 DIAGNOSIS — I10 Essential (primary) hypertension: Secondary | ICD-10-CM | POA: Insufficient documentation

## 2014-04-04 DIAGNOSIS — M5431 Sciatica, right side: Secondary | ICD-10-CM | POA: Insufficient documentation

## 2014-04-04 DIAGNOSIS — Z79899 Other long term (current) drug therapy: Secondary | ICD-10-CM | POA: Insufficient documentation

## 2014-04-04 DIAGNOSIS — F419 Anxiety disorder, unspecified: Secondary | ICD-10-CM | POA: Insufficient documentation

## 2014-04-04 DIAGNOSIS — G8929 Other chronic pain: Secondary | ICD-10-CM | POA: Insufficient documentation

## 2014-04-04 MED ORDER — TRAMADOL HCL 50 MG PO TABS: 50.0000 mg | ORAL_TABLET | Freq: Four times a day (QID) | ORAL | Status: DC | PRN

## 2014-04-04 MED ORDER — ONDANSETRON 4 MG PO TBDP
4.0000 mg | ORAL_TABLET | Freq: Once | ORAL | Status: AC
  Administered 2014-04-04: 4 mg via ORAL
  Filled 2014-04-04: qty 1

## 2014-04-04 MED ORDER — NAPROXEN 500 MG PO TABS: 500.0000 mg | ORAL_TABLET | Freq: Two times a day (BID) | ORAL | Status: DC

## 2014-04-04 MED ORDER — HYDROMORPHONE HCL 1 MG/ML IJ SOLN
1.0000 mg | Freq: Once | INTRAMUSCULAR | Status: AC
  Administered 2014-04-04: 1 mg via INTRAMUSCULAR
  Filled 2014-04-04: qty 1

## 2014-04-04 MED ORDER — DIPHENHYDRAMINE HCL 25 MG PO CAPS
25.0000 mg | ORAL_CAPSULE | Freq: Once | ORAL | Status: AC
  Administered 2014-04-04: 25 mg via ORAL
  Filled 2014-04-04: qty 1

## 2014-04-04 MED ORDER — IBUPROFEN 800 MG PO TABS
800.0000 mg | ORAL_TABLET | Freq: Once | ORAL | Status: AC
  Administered 2014-04-04: 800 mg via ORAL
  Filled 2014-04-04: qty 1

## 2014-04-04 NOTE — ED Notes (Signed)
Pt requesting re-evaluation of pain level. PA advised. Discharge postponed. Snack given to pt.

## 2014-04-04 NOTE — ED Notes (Signed)
PER EMS- Pt c/o one month hx back pain, increased over last 24 hours. Language challenge- Spanish is primary language. pt is alert, oriented and approprriate
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
## 2014-04-04 NOTE — Discharge Instructions (Signed)
Citica con rehabilitacin (Sciatica with Rehab) El nervio citico va desde la regin inferior de la espalda hacia la pierna y es el responsable de la sensibilidad y el control de los msculos de la parte de atrs (posterior) del muslo, pierna y pie. La citica es una enfermedad caracterizada por una inflamacin en este nervio.  SNTOMAS  Signos de dao al nervio, incluso adormecimiento o debilidad en el lado posterior de las extremidades bajas.  Dolor en la parte posterior del muslo que podra bajar hacia la pierna.  Dolor que [place] al estar sentado durante largos perodos de [place].  Algunas veces, sensibilidad en las nalgas. CAUSAS La causa de la citica es la inflamacin de los nervios citicos. La inflamacin se debe a que algo irrita el nervio. Entre las causas de la irritacin se encuentran:  Estar sentado durante largos perodos.  Traumatismos directos al nervio.  Artritis de Tax adviser.  Hernia o ruptura de disco.  Deslizamiento de [place] (espondilolistesis).  Presin de los tejidos blandos, como msculos o el tejidos tipo ligamento (fascia). LOS RIESGOS AUMENTAN CON:  Deportes en los que se presiona la columna (ftbol americano o levantamiento de pesas).  Poca fuerza y flexibilidad.  No hacer un precalentamiento adecuado.  Historia familiar de dolor de cintura o trastornos en discos.  Lesiones o cirugas previas en la espalda.  Mecnica incorrecta del cuerpo, en especial al levantar, o mala postura. PREVENCIN  Precalentamiento adecuado y elongacin antes de la [place].  Mantener la forma fsica:  Kerry Hough, flexibilidad y resistencia muscular.  Capacidad cardiovascular.  Conozca y use tcnicas adecuadas, especialmente en posturas y levantamiento. Cuando sea posible, tener un entrenador que corrija la Programmer, systems.  Evite las actividades que tensionen constantemente la columna. PRONSTICO Si se trata adecuadamente, generalmente es curable  dentro de las 6 semanas. En ocasiones requiere someterse a [place].  [place] que incluye dolor, adormecimiento, hormigueo o debilidad.  Dolor crnico en la espalda.  Riesgos de la ciruga: infecciones, hemorragias, dao en los nervios, o daos a los tejidos circundantes. TRATAMIENTO El tratamiento inicial incluye interrumpir las actividades que agravan los sntomas. Se incluye el uso de medicamentos y la aplicacin de hielo para reducir Conservation officer, historic buildings y la inflamacin. Los ejercicios de elongacin y fortalecimiento pueden ayudar a reducir Conservation officer, historic buildings con la [place]. Los ejercicios pueden Press photographer o con un terapeuta. Un terapeuta podr recomendarle otros tratamientos, como estimulacin nerviosa electrnica transcutnea (TENS) o ultrasonido. En algunos casos se indica una inyeccin de corticoides para reducir la inflamacin del nervio citico. Si los sntomas persisten por ms de 6 meses de tratamiento no quirrgico (conservador), se Biochemist, clinical. MEDICAMENTOS   Si necesita analgsicos, se recomiendan los antiinflamatorios no esteroides, como aspirina e ibuprofeno y otros calmantes menores, como acetaminofeno.  No tome medicamentos para el dolor dentro de los 7 das previos a la [place].  Los analgsicos prescriptos se indicarn si el mdico lo considera necesario. Utilcelos como se le indique y slo cuando lo necesite.  Los ungentos pueden ser beneficiosos.  En algunos casos se indica una inyeccin de corticosteroides. Estas inyecciones deben reservarse para los casos graves, porque slo se pueden administrar una determinada cantidad de veces. CALOR Y FRO   El tratamiento con fro [employer] y reduce la inflamacin. El fro debe aplicarse durante 10 a 15 minutos cada 2  3 horas para reducir la inflamacin y Conservation officer, historic buildings e inmediatamente despus de cualquier actividad que agrava los sntomas.  Utilice bolsas de hielo o masajee la zona  con un trozo de hielo (masaje de hielo).  El calor puede usarse antes de Neurosurgeon y [place] fortalecimiento indicadas por el profesional, le fisioterapeuta o Industrial/product designer. Utilice una bolsa trmica o sumerja la lesin en agua caliente. SOLICITE ATENCIN MDICA SI:  El tratamiento parece no ofrecer beneficios, o el trastorno [place].  Los medicamentos producen efectos secundarios. [place] personas con dolor de citica encuentran que sus sntomas empeoran con el delantero excesiva flexin (flexin) o arco en la espalda baja (extensin). Los ejercicios que le ayudarn a Investment banker, operational sus sntomas se Furniture conservator/restorer. El mdico, fisioterapeuta o Paediatric nurse a Teacher, adult education qu ejercicios sern de ayuda para resolver su dolor de espalda. No realice ningn ejercicio sin consultarlo antes con el profesional. Discontinue los ejercicios que empeoran sus sntomas, hasta que hable con el mdico. Si siente dolor, entumecimiento u hormigueo que [employer] glteos, piernas o pies, el objetivo de esta terapia es que estos sntomas se acerquen a la espalda y Occupational hygienist. En ocasiones, los sntomas de la pierna mejorarn, Psychologist, sport and exercise en la espalda puede empeorar; esto es un indicio tpico del progreso en la rehabilitacin. Asegrese de que estar atento a cualquier cambio en sus sntomas y las actividades que ha [employer] 24 horas antes del cambio. Compartir esta informacin con su mdico le permitir un mejor tratamiento para tratar su enfermedad. Estos ejercicios le ayudarn en la recuperacin de la lesin. Los sntomas podrn aliviarse con o sin asistencia adicional de su mdico, fisioterapeuta o Administrator, sports. Al completar estos ejercicios, recuerde:   Restaurar la flexibilidad del tejido ayuda a que las articulaciones recuperen el movimiento normal. Esto permite que el  movimiento y la actividad sea ms saludables y menos dolorosos.  Para que sea efectiva, cada elongacin debe realizarse durante al menos 30 segundos.  La elongacin nunca debe ser dolorosa. Deber sentir slo un alargamiento o distensin suave del tejido que estira. EJERCICIOS DE AMPLITUD DE MOVIMIENTOS Y ELONGACIN: ELONGACION Flexin - una rodilla al pecho  Recustese en una cama dura o sobre el piso, con ambas piernas extendidas al frente.  Manteniendo una pierna en contacto con el piso, lleve la rodilla opuesta al pecho. Mantenga la pierna en esa posicin, sostenindola por la zona posterior del muslo o por la rodilla.  Presione hasta sentir un suave estiramiento en la cintura. Mantenga esta posicin durante __________ segundos.  Libere la pierna lentamente y repita el ejercicio con el lado opuesto. Reptalo __________ veces. Realice este estiramiento __________ Vicenta Aly por da.  ELONGACIN Flexin, dos rodillas al pecho   Recustese en una cama dura o sobre el piso, con ambas piernas extendidas al frente.  Manteniendo una pierna en contacto con el piso, lleve la rodilla opuesta al pecho.  Tense los msculos del estmago para apoyar la espalda y levante la otra rodilla [place]. Mantenga las piernas en su lugar y tmese por detrs [place].  Con ambas rodillas en el pecho, tire hasta que sienta un estiramiento en la parte trasera de la espalda. Mantenga esta posicin durante __________ segundos.  Tense los msculos del estmago y baje las piernas de a una por vez. Reptalo __________ veces. Realice este estiramiento __________ Vicenta Aly por da.  ELONGACIN Rotacin de la zona baja del tronco.  Recustese sobre una cama firme o sobre el suelo.  [place], doble las rodillas de modo que ambas apunten hacia el techo y los pies queden bien apoyados en el piso.  Extienda los brazos a Teaching laboratory technician. Esto estabilizar la zona superior del cuerpo,  manteniendo los hombros en contacto con el piso.  Con cuidado y lentamente deje caer ambas rodillas juntas hacia un lado, hasta que sienta un suave estiramiento en la espalda baja. Mantenga esta posicin durante __________ segundos.  Tense los msculos del estmago para apoyar la espalda y lleve las rodillas a la posicin inicial. Repita el ejercicio hacia el otro lado. Reptalo __________ veces. Realice este ejercicio __________ veces por da. EJERCICIOS DE AMPLITUD DE MOVIMIENTOS Y FLEXIBILIDAD: ELONGACIN Extensin posicin prona sobre los codos  Acustese sobre el estmago sobre el piso, una cama ser muy blanda. Coloque las palmas a una distancia igual al ancho de los hombres y a la altura de la cabeza.  Coloque los codos bajo los hombros. Si siente dolor, colquese almohadas debajo del pecho.  Deje que su cuerpo se relaje, de modo que las caderas queden ms abajo y tengan ms contacto con el piso.  Mantenga esta posicin durante __________ segundos.  Vuelva lentamente a la posicin plana sobre el piso. Reptalo __________ veces. Realice este estiramiento __________ Vicenta Aly por da.  [place] de brazos en posicin prona  Acustese sobre el [employer] piso, una cama ser [place]. Coloque las palmas a una distancia igual al ancho de los hombres y a la altura de la cabeza.  Mantenga la espalda tan relajada como pueda, enderece lentamente los codos mientras mantiene las caderas contra el suelo. Puede modificar la posicin de las manos para estar ms cmodo. A medida que gana movimiento, sus manos quedarn ms por debajo de los hombros.  Mantenga cada posicin durante __________ segundos.  Vuelva lentamente a la posicin plana sobre el piso. Reptalo __________ veces. Realice este estiramiento __________ Vicenta Aly por da.  EJERCICIOS DE FORTALECIMIENTO - Citica Estos ejercicios le ayudarn en la recuperacin de la lesin. Estos ejercicios deben  hacerse cerca de su "punto dulce". Este es el arco neutro, de la parte baja de la espalda, en algn lugar entre la posicin completamente redondeada y arqueada plenamente, que es la posicin menos dolorosa. Cuando se realiza en [employer] de seguridad del movimiento, estos ejercicios se pueden Risk manager para las personas que tienen una lesin basada en flexin o extensin. Con estos ejercicios, los sntomas podrn desaparecer con o sin mayor intervencin del profesional, el fisioterapeuta o Industrial/product designer. Al completar estos ejercicios, recuerde:   Los msculos pueden ganar tanto la resistencia como la fuerza que necesita para sus actividades diarias a travs de ejercicios controlados.  Realice los ejercicios como se lo indic el mdico, el fisioterapeuta o Industrial/product designer. [place] slo con los ejercicios de resistencia y haga las repeticiones que su mdico le indique.  Podr experimentar dolor o cansancio muscular, pero el dolor o molestia que trata de eliminar a travs de los ejercicios nunca debe empeorar. Si el dolor empeora, detngase y asegrese de que est siguiendo las directivas correctamente. Si an siente dolor luego de Optometrist lo ajustes necesarios, deber discontinuar el ejercicio hasta que pueda conversar con el profesional sobre el problema. FORTALECIMIENTO - Abdominales profundos - Inclinacin plvica  Recustese sobre una cama firme o sobre el suelo. [place], doble las rodillas de modo que ambas apunten hacia el techo y los pies queden bien apoyados en  el piso.  Tensione la zona baja de los msculos abdominales para presionar la Materials engineer. Este movimiento har rotar su pelvis de modo que el cccix quede hacia arriba y no apuntando a los pies o hacia el piso.  Con una tensin suave y respiracin pareja, mantenga esta posicin durante __________ segundos. Reptalo __________ veces. Realice este estiramiento __________ Vicenta Aly por da.  FORTALECIMIENTO -  Abdominales encogimiento abdominal  Recustese sobre una cama firme o sobre el suelo. [place], doble las rodillas de modo que ambas apunten hacia el techo y los pies queden bien apoyados en el piso. [place].  Apunte suavemente con la barbilla hacia abajo, sin doblar el cuello.  Tensione los abdominales y eleve lentamente el tronco la altura suficiente para despegar los omplatos. Si se eleva ms, pondr tensin excesiva en la cintura y esto no fortalecer ms los abdominales.  Controle la vuelta a la posicin inicial. Reptalo __________ veces. Realice este estiramiento __________ Vicenta Aly por da.  FORTALECIMIENTO - En cuadrpedo, elevacin de miembro superior e inferior opuestos  [employer] y las rodillas en una superficie firme. Las manos deben quedar a la altura de los hombros y las rodillas [place]. Puede colocar algo debajo las rodillas para estar ms cmodo.  Encuentre la posicin neutral de la columna vertebral y Heritage manager los msculos abdominales de modo que pueda mantener esta posicin. Los hombros y las caderas deben formar un rectngulo paralelo con el suelo y recto.  Manteniendo el tronco firme, eleve la mano derecha a la altura del hombro y luego eleve la pierna izquierda a la altura de la cadera. Asegrese de no contener la respiracin. Mantenga cada posicin durante __________ segundos.  Con los msculos abdominales en tensin y la espalda firme, vuelva lentamente a la posicin inicial. Repita con el otro brazo y la otra pierna. Reptalo __________ veces. Realice este estiramiento __________ Vicenta Aly por da.  FUERZA Abdominales y cudriceps - Lexicographer las piernas rectas  Recustese en una cama dura o sobre el piso, con ambas piernas extendidas al frente.  Deje una pierna en contacto con el suelo y doble la otra rodilla de manera que el pie quede contra el suelo.  Encuentre la posicin neutral de la  columna vertebral y Heritage manager los msculos abdominales de modo que pueda mantener esta posicin.  Levante lentamente la pierna del suelo una 6 pulgadas y cuente [place], asegrese de no contener la respiracin.  Mantega la columna en posicin neutral, y baje lentamente la pierna hasta el suelo. Repita el ejercicio con cada pierna __________ veces. Realice este estiramiento __________ Vicenta Aly por da. CONSIDERACIONES ACERCA DE LA POSTURA Y LA MECNICA DEL CUERPO Citica Si mantiene una postura correcta cuando se encuentre de pie, sentado o realizando sus actividades, reducir el [employer] tejidos del cuerpo, y Advertising account executive a los tejidos lesionados la posibilidad de curarse y Engineering geologist las experiencias dolorosas. A continuacin se indican pautas generales para mejorar la postura- Su mdico o fisioterapeuta le dar instrucciones especficas segn sus necesidades. Al leer estas pautas recuerde:  Los ejercicios indicados por su mdico lo ayudarn a Scientist, product/process development flexibilidad y la fuerza para Theatre manager las posturas correctas.  Una postura correcta le proporciona a sus articulaciones el medio ptimo para funcionar bien. Las articulaciones se desgastan menos cuando estn sostenidas adecuadamente por una columna vertebral en buena postura. Esto significa que su cuerpo estar ms sano y Network engineer.  La correcta postura debe practicarse en todas las actividades, especialmente al estar sentado o de pie durante [place]. Tambin es importante al realizar actividades repetitivas de bajo estrs (tipeo) o una actividad nica y pesada. POSICIONES DE Cathe Mons Tenga en cuenta cules son las posturas que ms dolor le causan al elegir una posicin de descanso. Si siente dolor con las actividades en que deba realizar una flexin (sentarse, inclinarse, detenerse, ponerse en cuclillas), elija una posicin que le permita descansar en una postura menos flexionada. Evite curvarse en posicin fetal cuando se  encuentre de lado. Si el dolor empeora con las actividades basadas en la extensin (estar de pie durante un tiempo prolongado, trabajar con las manos por arriba de la cabeza) evite descansar en Ardelia Mems posicin extendida durante mucho tiempo, como dormir sobre el [place]. La [employer] de las Artist cmodo el descanso sobre la columna vertebral en una posicin neutral, ni muy redondeada ni [place]. Recustese sobre su lado en una cama que no est hundida con una almohada entre las rodillas o sobre la espalda con una almohada bajo las rodillas, y sentir [place]. Tenga en cuenta que cualquier posicin en [employer], no importa si es una postura [place], puede provocarle rigidez. POSTURAS CORRECTAS PARA SENTARSE Con el fin de minimizar el estrs y Health and safety inspector en su columna, deber sentarse con la postura correcta. Esto le ayudar a que el cuerpo est ms sano. Recuperar una buena postura es un proceso gradual. Muchas personas pueden trabajar ms cmodas mediante el uso de diferentes soportes hasta que tengan la flexibilidad y la fuerza para mantener esta postura por su cuenta. Al sentarse con la [employer], los odos deben estar sobre los hombros y los hombros [place]. Debe utilizar el respaldo de la silla para apoyar la espalda. La espalda estar en una posicin neutral, ligeramente arqueada. Puede colocar una pequea almohada o toalla doblada en la base de la espalda baja para apoyarse.  Si trabaja en un escritorio, cree un ambiente que le proporciones un buen soporte y [place]. Sin soporte extra, los msculos se fatigan y causan tensin excesiva en las articulaciones y otros tejidos. Tenga en cuenta estas recomendaciones: SILLA:   La silla debe poder deslizarse por debajo del escritorio cuando su espalda tome contacto con el respaldo. Esto le permitir trabajar ms cerca.  La altura de la silla debe permitirle que los ojos tengan el nivel de la parte superior  del monitor y las manos estn ms abajo que los codos. POSICIN DEL CUERPO  Los pies deben tener contacto con el piso. Si no es posible, use un posapies.  Mantenga las [employer] hombros. Esto reducir el estrs en el cuello y en la cintura. POSTURAS INCORRECTAS PARA SENTARSE  Si se siente cansado e incapaz de asumir una postura sentada sana, no se encorve ni se hunda. Esto pone una tensin excesiva en los tejidos de su espalda, y causa ms dao y Social research officer, government.Inglis opciones ms saludables se incluyen:  El uso de ms apoyo, como una almohada lumbar.  Cambio de tareas, a algo que demande una posicin vertical o caminar.  Tomar una breve caminata.  Recostarse y Physicist, medical posicin neutral. DE PIE DURANTE UN TIEMPO PROLONGADO E INCLINADO LIGERAMENTE HACIA ADELANTE Cuando deba realizar una tarea que requiera inclinacin hacia adelante estando de pie en el mismo sitio durante mucho tiempo, coloque un pie en un objeto de 2 a 4 pulgadas de alto, para [employer].  Cuando ambos pies estn en el piso, la zona inferior de la espalda tiene a perder su ligera curvatura hacia adentro. Si esta curva se aplana (o se pronuncia demasiado) la espalda y las articulaciones experimentarn demasiado estrs, se fatigarn ms rpidamente y Therapist, sports.  POSTURAS CORRECTAS PARA ESTAR DE PIE Una postura adecuada de pie realizarse en todas las actividades diarias, incluso si slo toman un momento, como al [employer]. Como en la postura de sentado, los odos deben estar sobre los hombros y los hombros [place]. Deber mantener una ligera tensin en sus msculos abdominales para asegurar la columna vertebral. El cccix debe apuntar hacia el suelo, no detrs de su cuerpo, por que resulta en una curvatura de la espalda sobre-extendida.  [place] posturas incorrectas para estar de pie incluyen tener la cabeza hacia delante, las rodillas  bloqueadas o una excesiva curvatura de la espalda. CAMINAR [place] en [place] erguida. [place], hombros y caderas deben estar alineados. ACTIVIDAD PROLONGADA EN UNA POSICIN FLEXIONADA Al completar una tarea que requiere que se doble la cintura hacia adelante o inclinarse sobre una superficie baja, trate de encontrar una manera de estabilizar 3 de cada 4 de sus miembros. Puede colocar una mano o el codo en el [place], o descansar una rodilla en la superficie en la que est apoyado. Esto le proporcionar ms estabilidad para que sus msculos no se cansen tan rpidamente. El [employer] rodillas [place], o ligeramente dobladas, tambin reducir el estrs en la espalda baja. TCNICAS CORRECTAS PARA LEVANTAR OBJETOS SI:   Asumir una postura amplia. Esto le proporcionar ms estabilidad y la oportunidad de acercarse lo ms posible al objeto que se est levantando.  Tense los abdominales para asegurar la columna vertebral; luego flexione rodillas y caderas. Manteniendo la espalda en una posicin neutral, haga el esfuerzo con los msculos de la pierna. Levntese con las piernas, manteniendo la espalda derecha.  Pruebe el peso de los objetos desconocidos antes de tratar de Special educational needs teacher.  Trate de [employer] codos hacia abajo y a los lados, con el fin de obtener la fuerza de los hombros al llevar un objeto.  Siempre pida ayuda a otra persona cuando deba levantar objetos pesados o incmodos TCNICAS INCORRECTAS PARA LEVANTAR OBJETOS NO:   Bloquee rodillas al levantar, aunque sea un objeto pequeo.  Se doble ni gire. Gire sobre los pies ni los mueva cuando necesite cambiar de direccin.  Tome conciencia que no puede levantar con seguridad ni un clip de papel, sin Chiropodist. Document Released: 05/26/2009 Document Revised: 08/30/2011 [facility] Patient Information 2015 [place]. This information is not intended to replace advice given to you by your health care provider. Make  sure you discuss any questions you have with your health care provider.

## 2014-04-04 NOTE — ED Notes (Signed)
Pt reports pain in low back and r/leg x 1 month. Pain increased over last 24 hours. Denies trauma

## 2014-04-04 NOTE — ED Notes (Signed)
Bed: UOH7 Expected date:  Expected time:  Means of arrival:  Comments: EMS- back pain

## 2014-04-05 NOTE — ED Provider Notes (Signed)
Medical screening examination/treatment/procedure(s) were performed by non-physician practitioner and as supervising physician I was immediately available for consultation/collaboration.   EKG Interpretation None        Francine Graven, DO 04/05/14 [phone]

## 2014-04-15 ENCOUNTER — Other Ambulatory Visit: Payer: Self-pay | Admitting: Internal Medicine

## 2014-04-19 ENCOUNTER — Other Ambulatory Visit: Payer: Self-pay | Admitting: Internal Medicine

## 2014-04-19 NOTE — Telephone Encounter (Signed)
Rx called in 

## 2014-04-22 ENCOUNTER — Encounter (HOSPITAL_COMMUNITY): Payer: Self-pay | Admitting: Emergency Medicine

## 2014-05-06 ENCOUNTER — Emergency Department (HOSPITAL_COMMUNITY)
Admission: EM | Admit: 2014-05-06 | Discharge: 2014-05-06 | Disposition: A | Discharge status: Home / Self Care | Payer: Self-pay | Attending: Emergency Medicine | Admitting: Emergency Medicine

## 2014-05-06 ENCOUNTER — Encounter (HOSPITAL_COMMUNITY): Payer: Self-pay | Admitting: *Deleted

## 2014-05-06 ENCOUNTER — Emergency Department (HOSPITAL_COMMUNITY): Payer: Self-pay

## 2014-05-06 DIAGNOSIS — G43909 Migraine, unspecified, not intractable, without status migrainosus: Secondary | ICD-10-CM | POA: Insufficient documentation

## 2014-05-06 DIAGNOSIS — Z791 Long term (current) use of non-steroidal anti-inflammatories (NSAID): Secondary | ICD-10-CM | POA: Insufficient documentation

## 2014-05-06 DIAGNOSIS — Z79899 Other long term (current) drug therapy: Secondary | ICD-10-CM | POA: Insufficient documentation

## 2014-05-06 DIAGNOSIS — F419 Anxiety disorder, unspecified: Secondary | ICD-10-CM | POA: Insufficient documentation

## 2014-05-06 DIAGNOSIS — G8929 Other chronic pain: Secondary | ICD-10-CM | POA: Insufficient documentation

## 2014-05-06 DIAGNOSIS — F329 Major depressive disorder, single episode, unspecified: Secondary | ICD-10-CM | POA: Insufficient documentation

## 2014-05-06 DIAGNOSIS — M5430 Sciatica, unspecified side: Secondary | ICD-10-CM | POA: Insufficient documentation

## 2014-05-06 DIAGNOSIS — Z8719 Personal history of other diseases of the digestive system: Secondary | ICD-10-CM | POA: Insufficient documentation

## 2014-05-06 DIAGNOSIS — I10 Essential (primary) hypertension: Secondary | ICD-10-CM | POA: Insufficient documentation

## 2014-05-06 LAB — URINALYSIS, ROUTINE W REFLEX MICROSCOPIC
Bilirubin Urine: NEGATIVE
Glucose, UA: NEGATIVE mg/dL
Hgb urine dipstick: NEGATIVE
Ketones, ur: NEGATIVE mg/dL
Leukocytes, UA: NEGATIVE
Nitrite: NEGATIVE
Protein, ur: NEGATIVE mg/dL
Specific Gravity, Urine: 1.008 (ref 1.005–1.030)
UROBILINOGEN UA: 1 mg/dL (ref 0.0–1.0)
pH: 7 (ref 5.0–8.0)

## 2014-05-06 MED ORDER — NAPROXEN 500 MG PO TABS: 500.0000 mg | ORAL_TABLET | Freq: Two times a day (BID) | ORAL | Status: DC

## 2014-05-06 MED ORDER — HYDROCODONE-ACETAMINOPHEN 5-325 MG PO TABS: 1.0000 | ORAL_TABLET | ORAL | Status: DC | PRN

## 2014-05-06 MED ORDER — METHOCARBAMOL 500 MG PO TABS: 500.0000 mg | ORAL_TABLET | Freq: Two times a day (BID) | ORAL | Status: DC | PRN

## 2014-05-06 MED ORDER — KETOROLAC TROMETHAMINE 60 MG/2ML IM SOLN
60.0000 mg | Freq: Once | INTRAMUSCULAR | Status: AC
  Administered 2014-05-06: 60 mg via INTRAMUSCULAR
  Filled 2014-05-06: qty 2

## 2014-05-06 MED ORDER — HYDROCODONE-ACETAMINOPHEN 5-325 MG PO TABS
1.0000 | ORAL_TABLET | Freq: Once | ORAL | Status: AC
  Administered 2014-05-06: 1 via ORAL
  Filled 2014-05-06: qty 1
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
## 2014-05-06 NOTE — ED Notes (Signed)
Pt speaking english clearly

## 2014-05-06 NOTE — ED Provider Notes (Signed)
9:29 PM Pt seen with Dansie PA-C. Patient with 4 months of right-sided low back pain with radiation into R leg. No weakness. She is ambulatory. Patient denies warning symptoms of back pain including: fecal incontinence, urinary retention or overflow incontinence, night sweats, waking from sleep with back pain, unexplained fevers or weight loss, h/o cancer, IVDU, recent trauma. The only concerning feature was back pain duration of 4 months. X-ray shows some degeneration. Pt informed. She is appropriate for follow-up with PCP. She is renewing her orange card in 2 days and can follow-up after that time.   Patient was counseled on back pain precautions and told to do activity as tolerated but do not lift, push, or pull heavy objects more than 10 pounds for the next week.  Patient counseled to use ice or heat on back for no longer than 15 minutes every hour.   Patient urged to follow-up with PCP if pain does not improve with treatment and rest or if pain becomes recurrent. Urged to return with worsening severe pain, loss of bowel or bladder control, trouble walking. The patient verbalizes understanding and agrees with the plan.    Carlisle Cater, PA-C 05/06/14 2132  Pamella Pert, MD 05/06/14 [phone]

## 2014-05-06 NOTE — ED Notes (Signed)
Will, PA-C, is at the bedside.

## 2014-05-06 NOTE — ED Notes (Signed)
Discussed with Will, PA-C, that patient needed standby assistance to ambulate to the restroom, unsteady gait frrom pain, 10/10 in back and leg.  Discussed with the patient that pain medication administration must be monitored in regards to frequency as well. She acknowledges. No new orders from Will, PA-C

## 2014-05-06 NOTE — ED Notes (Signed)
Pt states she was seen here 2 months ago and seen here for R lower back pain that radiates to R leg/foot.  States some urinary incontinence and nausea.  States pain is constant.  States she came today b/c is waiting for orange card before she can see pcp and can't wait b/c pain is too great.

## 2014-05-06 NOTE — ED Notes (Signed)
Patient states she is not urinary incontinent.

## 2014-05-06 NOTE — ED Notes (Signed)
PA at bedside, translator on phone per pts request

## 2014-05-06 NOTE — Discharge Instructions (Signed)
Ejercicios para la espalda (Back Exercises) Estos ejercicios ayudan a tratar y prevenir lesiones en la espalda. El objetivo es aumentar la fuerza de los msculos abdominales y dorsales y la flexibilidad de la espalda. Debe comenzar con estos ejercicios cuando ya no tenga dolor. Los ejercicios para la espalda incluyen:  Inclinacin de la pelvis - Recustese sobre la espalda con las rodillas flexionadas. Incline la pelvis hasta que la parte inferior de la espalda se apoye en el piso. Mantenga esta posicin durante 5 a 10 segundos y repita entre 5 y 15 veces.  Rodilla al pecho - Empuje primero una rodilla contra el pecho y [place] 20 a 30 segundos; repita con la otra rodilla y luego con ambas a la vez. Esto puede realizarlo con la otra pierna extendida o flexionada, del modo en que se sienta ms cmodo.  Abdominales o despegar el cccix del suelo empleando la musculatura abdominal - [place] 90 grados. Comience inclinando la pelvis y realice un ejercicio abdominal lento y parcial, elevando el tronco slo entre 71 y 66 grados del suelo. Emplee al Reynolds American 2 y 3 segundos para cada abdominal. No realice los abdominales con las rodillas extendidas. Si le resulta difcil realizar abdominales parciales, simplemente haga lo que se explic anteriormente, pero slo contraiga los msculos abdominales y Civil engineer, contracting tal como se le ha indicado.  Inclinacin de la cadera - Recustese sobre la espalda con las rodillas flexionadas a 90 grados. Empjese con los pies y los hombros mientras eleva la cadera un par de centmetros del suelo, [place] durante 10 segundos y repita entre 5 y 10 veces.  Arcos dorsales - Acustese sobre el Lorenzo e impulse el tronco hacia atrs sobre los codos flexionados. Presione lentamente con las manos, formando un arco con la zona inferior de la espalda. Repita entre 3 y 5 veces. Al realizar las repeticiones, luego de un tiempo disminuirn la rigidez y las  [place].  Elevacin de los hombros - Acustese hacia abajo con los brazos a los lados del cuerpo. [place] y Photographer torso contra el suelo mientras eleva lentamente la cabeza y los hombros del suelo. No exagere con los ejercicios, especialmente en el comienzo. Los ejercicios pueden causar alguna molestia leve en la espalda durante algunos minutos; sin embargo, si el dolor es muy intenso, o dura ms de 15 minutos, no siga con la actividad fsica hasta que consulte al profesional que lo asiste. Los problemas en la espalda mejoran de Crown lenta con esta terapia.  Consulte al profesional para que lo ayude a planificar un programa de ejercicios adecuado para su espalda. Document Released: 06/07/2005 Document Revised: 08/30/2011 [facility] Patient Information 2015 [place]. This information is not intended to replace advice given to you by your health care provider. Make sure you discuss any questions you have with your health care provider. Citica  (Sciatica)  La citica es Conservation officer, historic buildings, debilidad, entumecimiento u hormigueo a lo largo del nervio citico. El nervio comienza en la zona inferior de la espalda y desciende por la parte posterior de cada pierna. El nervio controla los msculos de la parte inferior de la pierna y de la zona posterior de la rodilla, y transmite la sensibilidad a la parte posterior del muslo, la pierna y la planta del pie. La citica es un sntoma de otras afecciones mdicas. Por ejemplo, un dao a los nervios o algunas enfermedades como un disco herniado o un espoln seo en la columna vertebral, podran daarle o presionar en  el nervio citico. Esto causa dolor, debilidad y otras sensaciones normalmente asociadas con la citica. Generalmente la citica afecta slo un lado del cuerpo. CAUSAS   Disco herniado o desplazado.  Enfermedad degenerativa del disco.  Un sndrome doloroso que compromete un msculo angosto de los glteos (sndrome piriforme).  Lesin o  fractura plvica.  Embarazo.  Tumor (casos raros). SNTOMAS  Los sntomas pueden variar de leves a muy graves. Por lo general, los sntomas descienden desde la zona lumbar a las nalgas y la parte posterior de la pierna. Ellos son:   Hormigueo leve o dolor sordo en la parte inferior de la espalda, la pierna o la cadera.  Adormecimiento en la parte posterior de la pantorrilla o la planta del pie.  Sensacin de Southwest [employer] zona lumbar, la pierna o la cadera.  Dolor agudo en la zona inferior de la espalda, la pierna o la cadera.  Debilidad en las piernas.  Dolor de espalda intenso que H. [employer]. [employer]inz movimientos. Los sntomas pueden empeorar al toser, Brewing technologist, rer o estar sentado o parado durante [employer]. Adems, el sobrepeso puede empeorar los sntomas.  DIAGNSTICO  Su mdico le har un examen fsico para buscar los sntomas comunes de la citica. Le pedir que haga algunos movimientos o actividades que activaran el dolor del nervio citico. Para encontrar las causas de la citica podr indicarle otros estudios. Estos pueden ser:   Anlisis de [place].  Radiografas.  Pruebas de diagnstico por imgenes, como resonancia magntica o tomografa computada. TRATAMIENTO  El tratamiento se dirige a las causas de la citica. A veces, el tratamiento no es necesario, y Conservation officer, historic buildings y Health and safety inspector desaparecen por s mismos. Si necesita tratamiento, su mdico puede sugerir:   Medicamentos de venta libre para Best boy.  Medicamentos recetados, como antiinflamatorios, relajantes musculares o narcticos.  Aplicacin de calor o hielo en la zona del dolor.  Inyecciones de corticoides para disminuir el dolor, la irritacin y la inflamacin alrededor del nervio.  Reduccin de la Golden West Financial perodos de [place].  Ejercicios y estiramiento del abdomen para fortalecer y Teacher, English as a foreign language la flexibilidad de la columna vertebral. Su mdico puede sugerirle perder peso si el peso extra empeora el  dolor de espalda.  Fisioterapia.  La ciruga para eliminar lo que presiona o pincha el nervio, como un espoln seo o parte de una hernia de disco. INSTRUCCIONES PARA EL CUIDADO EN EL HOGAR   Slo tome medicamentos de venta libre o recetados para Glass blower/designer o Health and safety inspector, segn las indicaciones de su mdico.  Aplique hielo sobre el rea dolorida durante 20 minutos 3-4 veces por da durante los primeras 48-72 horas. Luego intente aplicar calor de la misma manera.  Haga ejercicios, elongue o realice sus actividades habituales, si no le causan ms dolor.  Cumpla con todas las sesiones de fisioterapia, segn le indique su mdico.  Cumpla con todas las visitas de control, segn le indique su mdico.  No use tacones altos o zapatos que no tengan buen apoyo.  Verifique que el colchn no sea muy blando. Un colchn firme Best boy y las [place]. SOLICITE ATENCIN MDICA DE INMEDIATO SI:   Pierde el control de la vejiga o del intestino (incontinencia).  Aumenta la debilidad en la zona inferior de la espalda, la pelvis, las nalgas o las piernas.  Siente irritacin o inflamacin en la espalda.  Tiene sensacin de ardor al Continental Airlines.  El dolor empeora cuando se acuesta o lo despierta por la noche.  El dolor es peor del que experiment en el pasado.  Dura ms de 4 semanas.  Pierde peso sin motivo de [place]urones sbita. ASEGRESE DE QUE:   Comprende estas instrucciones.  Controlar su enfermedad.  Solicitar ayuda de inmediato si no mejora o si empeora. Document Released: 06/07/2005 Document Revised: 12/07/2011 [facility] Patient Information 2015 [place], [place]. This information is not intended to replace advice given to you by your health care provider. Make sure you discuss any questions you have with your health care provider.  Emergency Department Resource Guide 1) Find a Doctor and Pay Out of Pocket Although you won't have to find out who is covered by your insurance  plan, it is a good idea to ask around and get recommendations. You will then need to call the office and see if the doctor you have chosen will accept you as a new patient and what types of options they offer for patients who are self-pay. Some doctors offer discounts or will set up payment plans for their patients who do not have insurance, but you will need to ask so you aren't surprised when you get to your appointment.  2) Contact Your Local Health Department Not all health departments have doctors that can see patients for sick visits, but many do, so it is worth a call to see if yours does. If you don't know where your local health department is, you can check in your phone book. The CDC also has a tool to help you locate your state's health department, and many state websites also have listings of all of their local health departments.  3) Find a [facility] If your illness is not likely to be very severe or complicated, you may want to try a walk in clinic. These are popping up all over the country in pharmacies, drugstores, and shopping centers. They're usually staffed by nurse practitioners or physician assistants that have been trained to treat common illnesses and complaints. They're usually fairly quick and inexpensive. However, if you have serious medical issues or chronic medical problems, these are probably not your best option.  No Primary Care Doctor: - Call Health Connect at  [phone] - they can help you locate a primary care doctor that  accepts your insurance, provides certain services, etc. - Physician Referral Service- [phone]  Chronic Pain Problems: Organization         Address  Phone   Notes  [facility]  [phone] Patients need to be referred by their primary care doctor.   Medication Assistance: Organization         Address  Phone   Notes  [facility] Medication [facility] [address] [place], [place] [address]  [phone] --Must be a resident of [facility] -- Must have NO insurance coverage whatsoever (no Medicaid/ Medicare, etc.) -- The pt. MUST have a primary care doctor that directs their care regularly and follows them in the community   MedAssist  [phone]   [employer]  [phone]    Agencies that provide inexpensive medical care: Organization         Address  Phone   Notes  [place]  [phone]   [facility]    [phone]   [facility] [place], [place] [address] [phone]   [place] [address], [place] [phone]   Planned Parenthood    (  [phone]   [facility]    [phone]   Community Health and [facility]  [address], [place] Phone:  [phone], Fax:  [phone] Hours of Operation:  9 am - 6 pm, M-F.  Also accepts Medicaid/Medicare and self-pay.  [facility] for [place], [address], [place] Phone: [phone], Fax: [phone]. Hours of Operation:  8:30 am - 5:30 pm, M-F.  Also accepts Medicaid and self-pay.  [facility] [address] [place] Phone: [phone]   [place], [place], [place] [phone], Ext. [phone] Mondays & Thursdays: 7-9 AM.  First 15 patients are seen on a first come, first serve basis.    [place] Providers:  Organization         Address  Phone   Notes  [facility] [address], [place] [phone] Also accepts self-pay patients.  [facility] [address], [place]  [phone]   [place], [address], [place] [phone]   [facility] [address] [place] [phone]   Lucianne Lei [address], [place]   [phone] Only accepts [place] Access [place] patients after they have their name applied to their card.   Self-Pay (no insurance) in [facility]:  Organization         Address  Phone   Notes  Sickle Cell Patients, [facility] [place] [phone]   [facility] [place] [phone]   [facility] [place]  [place], [place], [place] [phone]   [facility]/Dr. Osei-Bonsu  [address], [place] or [address], [place] [phone] Phone number for both [place] and [place] locations is the same.  Urgent Medical and [facility] [address], [place] [phone]   [facility] [address], [place] or [address] [phone]   [facility] [address], [place] [phone], phone; [phone], fax Sees patients 1st and 3rd Saturday of every month.  Must not qualify for public or private insurance (i.e. Medicaid, Medicare, [place] Health Choice, Veterans' Benefits)  Household income should be no more than 200% of the poverty level The clinic cannot treat you if you are pregnant or think you are pregnant  Sexually transmitted diseases are not treated at the clinic.    Dental Care: Organization         Address  Phone  Notes  [facility] Department of [facility] [place] [phone] Accepts children up to age 32 who are enrolled in [place] or [place]; pregnant women with a Medicaid card; and children who have applied for Medicaid or [place] Health Choice, but were declined, whose parents can pay a reduced fee at time of service.  [facility] Department of [facility]  [address], [place] [phone] Accepts children up to age 32 who are enrolled in [place] or [place]; pregnant women with a Medicaid card; and children who have applied for Medicaid or [place], but were declined, whose parents can pay a  reduced fee at time of service.  [facility] Adult [facility] Access PROGRAM  [place] [phone] Patients are seen by appointment only. Walk-ins are not accepted. [place] will see patients 37 years of age and older. Monday - Tuesday (8am-5pm) Most Wednesdays (8:30-5pm) $30 per visit, cash only  [facility] Adult Dental Access PROGRAM  [address], [facility] [phone] Patients are seen by appointment only. Walk-ins are not accepted. [place] will see patients 41 years of age and older. One Wednesday Evening (Monthly: Volunteer Based).  $30 per visit, cash only  [place]  [phone] for adults; Children under age 68, call Graduate Pediatric Dentistry at [phone]. Children aged 44-14, please call [phone] to request a pediatric application.  Dental services are provided in all areas of dental care including fillings, crowns and bridges, complete and partial dentures, implants, gum treatment, root canals, and extractions. Preventive care is also provided. Treatment is provided to both adults and children. Patients are selected via a lottery and there is often a waiting list.   [facility] [address] [place]  [phone] [email or web address]   [facility] [address] [place], [place] [phone], Ext. [phone] Second and Fourth Thursday of each month, opens at 6:30 AM; Clinic ends at 9 AM.  Patients are seen on a first-come first-served basis, and a limited number are seen during each clinic.   [facility]  [address] [place] Danker [place], [place] [phone]   Eligibility Requirements You must have lived in [place], [place], or [place] counties for at least the last three months.   You cannot be eligible for state or  federal sponsored Apache Corporation, including [employer], [place], or [employer].   You generally cannot be eligible for healthcare insurance through your employer.    How to apply: Eligibility screenings are held every Tuesday and Wednesday afternoon from 1:00 pm until 4:00 pm. You do not need an appointment for the interview!  [facility] [address] [place], [place]   [place]  [place] Department  [place]  [phone]    Behavioral Health Resources in the Community: Intensive Outpatient Programs Organization         Address  Phone  Notes  Callery Runnels. [address] [place], [place] [phone]   [facility] Outpatient [address], [place], [place]   ADS: Alcohol & Drug Svcs [address], [place], [place]   [place] [address],  [place], [place] or [phone]   Substance Abuse Resources Organization         Address  Phone  Notes  Alcohol and Drug Services  [phone]   [place]  [phone]   The [place]   Chinita Pester  [phone]   Residential & Outpatient Substance Abuse Program  [phone]   Psychological Services Organization         Address  Phone  Notes  [facility]  [place]  [phone]   [place] [address] [place] or [phone]    Mobile Crisis Teams Organization         Address  Phone  Notes  [facility]rapeutic Alternatives, Mobile Crisis Care Unit  [phone]  [facility]otherapeutic [facility]  [address] [place], [place]   [facility] [address] [place] [phone]    Self-Help/Support Groups Organization         Address  Phone              Notes  Mental Health Assoc. of [place] - variety of support groups  [place] Call for more information  Narcotics Anonymous (NA), [facility] [address], [employer] [place]  2 meetings at this location   Special educational needs teacher         Address  Phone  Notes  ASAP Residential Treatment [place],    [place]  [phone]   [facility]  [address], [place] [id number], [place], [place]   [place], [place] [phone] Admissions: 8am-3pm M-F  Incentives Substance [place] [address]    [place], [place] [phone]   [facility] [address] [place], [place], [place]   [facility] [address],  [place], [place]   [facility]ight Programs - Intensive Outpatient [address]een [address], [place], [place]   [facility] ([place].) [place].,  [place], [place] [phone] or [phone]   [facility]idential Treatment Services (RTS) [address] [place], [place] Accepts Medicaid  [facility] [place] [address]  [place] [phone] Substance Abuse/Addiction Treatment   [facility] Organization         Address  Phone  Notes  [facility]  [phone]   Domenic Schwab, PhD [address] [place]lis [place], [place]   [phone] or [phone]   [place], [place] [phone]   [facility] [address] [place], [place] [phone] Insurance/Medicaid/sponsorship through [facility] and Families [address], [address] [place]                                    [place], [place] [phone] [place] [address], [place] [phone]    Dr. Adele Schilder  [phone]   [facility] [place]  Dept. 1) [address], [place] 2) [place] 3)  [place] [address], [place] [phone]  [phone]   [place] [phone] or [phone] (After Hours)

## 2014-05-08 ENCOUNTER — Ambulatory Visit: Payer: Self-pay

## 2014-05-10 ENCOUNTER — Ambulatory Visit: Payer: Self-pay

## 2014-05-14 ENCOUNTER — Ambulatory Visit: Payer: Self-pay

## 2014-05-23 ENCOUNTER — Other Ambulatory Visit: Payer: Self-pay | Admitting: Internal Medicine

## 2014-05-24 ENCOUNTER — Other Ambulatory Visit: Payer: Self-pay | Admitting: Internal Medicine

## 2014-05-24 NOTE — Telephone Encounter (Signed)
Rx called in 

## 2014-05-29 NOTE — Telephone Encounter (Signed)
Called to pharm 

## 2014-06-03 ENCOUNTER — Encounter (HOSPITAL_COMMUNITY): Payer: Self-pay | Admitting: *Deleted

## 2014-06-03 ENCOUNTER — Emergency Department (INDEPENDENT_AMBULATORY_CARE_PROVIDER_SITE_OTHER)
Admission: EM | Admit: 2014-06-03 | Discharge: 2014-06-03 | Disposition: A | Discharge status: Home / Self Care | Payer: Self-pay | Source: Home / Self Care | Attending: Family Medicine | Admitting: Family Medicine

## 2014-06-03 DIAGNOSIS — M5441 Lumbago with sciatica, right side: Secondary | ICD-10-CM

## 2014-06-03 MED ORDER — HYDROCODONE-ACETAMINOPHEN 5-325 MG PO TABS: 1.0000 | ORAL_TABLET | Freq: Four times a day (QID) | ORAL | Status: DC | PRN

## 2014-06-03 MED ORDER — METHYLPREDNISOLONE ACETATE 80 MG/ML IJ SUSP
INTRAMUSCULAR | Status: AC
  Filled 2014-06-03: qty 1

## 2014-06-03 MED ORDER — KETOROLAC TROMETHAMINE 60 MG/2ML IM SOLN
60.0000 mg | Freq: Once | INTRAMUSCULAR | Status: AC
  Administered 2014-06-03: 60 mg via INTRAMUSCULAR

## 2014-06-03 MED ORDER — KETOROLAC TROMETHAMINE 60 MG/2ML IM SOLN
INTRAMUSCULAR | Status: AC
  Filled 2014-06-03: qty 2

## 2014-06-03 MED ORDER — IBUPROFEN 800 MG PO TABS: 800.0000 mg | ORAL_TABLET | Freq: Three times a day (TID) | ORAL | Status: DC

## 2014-06-03 MED ORDER — METHYLPREDNISOLONE ACETATE 40 MG/ML IJ SUSP
80.0000 mg | Freq: Once | INTRAMUSCULAR | Status: AC
  Administered 2014-06-03: 80 mg via INTRAMUSCULAR

## 2014-06-03 NOTE — ED Notes (Addendum)
Back pain onset 4 mos. ago.  No known injury.  C/o pain down R leg.  She got Naproxen, Robaxin and Hydrocodone on 11/16 but none of it helped.
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
## 2014-06-03 NOTE — Discharge Instructions (Signed)
Citica con rehabilitacin (Sciatica with Rehab) El nervio citico va desde la regin inferior de la espalda hacia la pierna y es el responsable de la sensibilidad y el control de los msculos de la parte de atrs (posterior) del muslo, pierna y pie. La citica es una enfermedad caracterizada por una inflamacin en este nervio.  SNTOMAS  Signos de dao al nervio, incluso adormecimiento o debilidad en el lado posterior de las extremidades bajas.  Dolor en la parte posterior del muslo que podra bajar hacia la pierna.  Dolor que [place] al estar sentado durante largos perodos de [place].  Algunas veces, sensibilidad en las nalgas. CAUSAS La causa de la citica es la inflamacin de los nervios citicos. La inflamacin se debe a que algo irrita el nervio. Entre las causas de la irritacin se encuentran:  Estar sentado durante largos perodos.  Traumatismos directos al nervio.  Artritis de Tax adviser.  Hernia o ruptura de disco.  Deslizamiento de [place] (espondilolistesis).  Presin de los tejidos blandos, como msculos o el tejidos tipo ligamento (fascia). LOS RIESGOS AUMENTAN CON:  Deportes en los que se presiona la columna (ftbol americano o levantamiento de pesas).  Poca fuerza y flexibilidad.  No hacer un precalentamiento adecuado.  Historia familiar de dolor de cintura o trastornos en discos.  Lesiones o cirugas previas en la espalda.  Mecnica incorrecta del cuerpo, en especial al levantar, o mala postura. PREVENCIN  Precalentamiento adecuado y elongacin antes de la [place].  Mantener la forma fsica:  Kerry Hough, flexibilidad y resistencia muscular.  Capacidad cardiovascular.  Conozca y use tcnicas adecuadas, especialmente en posturas y levantamiento. Cuando sea posible, tener un entrenador que corrija la Programmer, systems.  Evite las actividades que tensionen constantemente la columna. PRONSTICO Si se trata adecuadamente, generalmente es curable  dentro de las 6 semanas. En ocasiones requiere someterse a [place].  [place] que incluye dolor, adormecimiento, hormigueo o debilidad.  Dolor crnico en la espalda.  Riesgos de la ciruga: infecciones, hemorragias, dao en los nervios, o daos a los tejidos circundantes. TRATAMIENTO El tratamiento inicial incluye interrumpir las actividades que agravan los sntomas. Se incluye el uso de medicamentos y la aplicacin de hielo para reducir Conservation officer, historic buildings y la inflamacin. Los ejercicios de elongacin y fortalecimiento pueden ayudar a reducir Conservation officer, historic buildings con la [place]. Los ejercicios pueden Press photographer o con un terapeuta. Un terapeuta podr recomendarle otros tratamientos, como estimulacin nerviosa electrnica transcutnea (TENS) o ultrasonido. En algunos casos se indica una inyeccin de corticoides para reducir la inflamacin del nervio citico. Si los sntomas persisten por ms de 6 meses de tratamiento no quirrgico (conservador), se Biochemist, clinical. MEDICAMENTOS   Si necesita analgsicos, se recomiendan los antiinflamatorios no esteroides, como aspirina e ibuprofeno y otros calmantes menores, como acetaminofeno.  No tome medicamentos para el dolor dentro de los 7 das previos a la [place].  Los analgsicos prescriptos se indicarn si el mdico lo considera necesario. Utilcelos como se le indique y slo cuando lo necesite.  Los ungentos pueden ser beneficiosos.  En algunos casos se indica una inyeccin de corticosteroides. Estas inyecciones deben reservarse para los casos graves, porque slo se pueden administrar una determinada cantidad de veces. CALOR Y FRO   El tratamiento con fro [employer] y reduce la inflamacin. El fro debe aplicarse durante 10 a 15 minutos cada 2  3 horas para reducir la inflamacin y Conservation officer, historic buildings e inmediatamente despus de cualquier actividad que agrava los sntomas.  Utilice bolsas de hielo o masajee la zona  con un trozo de hielo (masaje de hielo).  El calor puede usarse antes de Neurosurgeon y [place] fortalecimiento indicadas por el profesional, le fisioterapeuta o Industrial/product designer. Utilice una bolsa trmica o sumerja la lesin en agua caliente. SOLICITE ATENCIN MDICA SI:  El tratamiento parece no ofrecer beneficios, o el trastorno [employer].  Los medicamentos producen efectos secundarios. [place] personas con dolor de citica encuentran que sus sntomas empeoran con el delantero excesiva flexin (flexin) o arco en la espalda baja (extensin). Los ejercicios que le ayudarn a Investment banker, operational sus sntomas se Furniture conservator/restorer. El mdico, fisioterapeuta o Paediatric nurse a Teacher, adult education qu ejercicios sern de ayuda para resolver su dolor de espalda. No realice ningn ejercicio sin consultarlo antes con el profesional. Discontinue los ejercicios que empeoran sus sntomas, hasta que hable con el mdico. Si siente dolor, entumecimiento u hormigueo que [employer] glteos, piernas o pies, el objetivo de esta terapia es que estos sntomas se acerquen a la espalda y Occupational hygienist. En ocasiones, los sntomas de la pierna mejorarn, Psychologist, sport and exercise en la espalda puede empeorar; esto es un indicio tpico del progreso en la rehabilitacin. Asegrese de que estar atento a cualquier cambio en sus sntomas y las actividades que ha [employer] 24 horas antes del cambio. Compartir esta informacin con su mdico le permitir un mejor tratamiento para tratar su enfermedad. Estos ejercicios le ayudarn en la recuperacin de la lesin. Los sntomas podrn aliviarse con o sin asistencia adicional de su mdico, fisioterapeuta o Administrator, sports. Al completar estos ejercicios, recuerde:   Restaurar la flexibilidad del tejido ayuda a que las articulaciones recuperen el movimiento normal. Esto permite que el  movimiento y la actividad sea ms saludables y menos dolorosos.  Para que sea efectiva, cada elongacin debe realizarse durante al menos 30 segundos.  La elongacin nunca debe ser dolorosa. Deber sentir slo un alargamiento o distensin suave del tejido que estira. EJERCICIOS DE AMPLITUD DE MOVIMIENTOS Y ELONGACIN: ELONGACION Flexin - una rodilla al pecho  Recustese en una cama dura o sobre el piso, con ambas piernas extendidas al frente.  Manteniendo una pierna en contacto con el piso, lleve la rodilla opuesta al pecho. Mantenga la pierna en esa posicin, sostenindola por la zona posterior del muslo o por la rodilla.  Presione hasta sentir un suave estiramiento en la cintura. Mantenga esta posicin durante __________ segundos.  Libere la pierna lentamente y repita el ejercicio con el lado opuesto. Reptalo __________ veces. Realice este estiramiento __________ Vicenta Aly por da.  ELONGACIN Flexin, dos rodillas al pecho   Recustese en una cama dura o sobre el piso, con ambas piernas extendidas al frente.  Manteniendo una pierna en contacto con el piso, lleve la rodilla opuesta al pecho.  Tense los msculos del estmago para apoyar la espalda y levante la otra rodilla [place]. Mantenga las piernas en su lugar y tmese por detrs [place].  Con ambas rodillas en el pecho, tire hasta que sienta un estiramiento en la parte trasera de la espalda. Mantenga esta posicin durante __________ segundos.  Tense los msculos del estmago y baje las piernas de a una por vez. Reptalo __________ veces. Realice este estiramiento __________ Vicenta Aly por da.  ELONGACIN Rotacin de la zona baja del tronco.  Recustese sobre una cama firme o sobre el suelo.  [place], doble las rodillas de modo que ambas apunten hacia el techo y los pies queden bien apoyados en el piso.  Extienda los brazos a Teaching laboratory technician. Esto estabilizar la zona superior del cuerpo,  manteniendo los hombros en contacto con el piso.  Con cuidado y lentamente deje caer ambas rodillas juntas hacia un lado, hasta que sienta un suave estiramiento en la espalda baja. Mantenga esta posicin durante __________ segundos.  Tense los msculos del estmago para apoyar la espalda y lleve las rodillas a la posicin inicial. Repita el ejercicio hacia el otro lado. Reptalo __________ veces. Realice este ejercicio __________ veces por da. EJERCICIOS DE AMPLITUD DE MOVIMIENTOS Y FLEXIBILIDAD: ELONGACIN Extensin posicin prona sobre los codos  Acustese sobre el estmago sobre el piso, una cama ser muy blanda. Coloque las palmas a una distancia igual al ancho de los hombres y a la altura de la cabeza.  Coloque los codos bajo los hombros. Si siente dolor, colquese almohadas debajo del pecho.  Deje que su cuerpo se relaje, de modo que las caderas queden ms abajo y tengan ms contacto con el piso.  Mantenga esta posicin durante __________ segundos.  Vuelva lentamente a la posicin plana sobre el piso. Reptalo __________ veces. Realice este estiramiento __________ Vicenta Aly por da.  [place] de brazos en posicin prona  Acustese sobre el [employer] piso, una cama ser [place]. Coloque las palmas a una distancia igual al ancho de los hombres y a la altura de la cabeza.  Mantenga la espalda tan relajada como pueda, enderece lentamente los codos mientras mantiene las caderas contra el suelo. Puede modificar la posicin de las manos para estar ms cmodo. A medida que gana movimiento, sus manos quedarn ms por debajo de los hombros.  Mantenga cada posicin durante __________ segundos.  Vuelva lentamente a la posicin plana sobre el piso. Reptalo __________ veces. Realice este estiramiento __________ Vicenta Aly por da.  EJERCICIOS DE FORTALECIMIENTO - Citica Estos ejercicios le ayudarn en la recuperacin de la lesin. Estos ejercicios deben  hacerse cerca de su "punto dulce". Este es el arco neutro, de la parte baja de la espalda, en algn lugar entre la posicin completamente redondeada y arqueada plenamente, que es la posicin menos dolorosa. Cuando se realiza en [employer] de seguridad del movimiento, estos ejercicios se pueden Risk manager para las personas que tienen una lesin basada en flexin o extensin. Con estos ejercicios, los sntomas podrn desaparecer con o sin mayor intervencin del profesional, el fisioterapeuta o Industrial/product designer. Al completar estos ejercicios, recuerde:   Los msculos pueden ganar tanto la resistencia como la fuerza que necesita para sus actividades diarias a travs de ejercicios controlados.  Realice los ejercicios como se lo indic el mdico, el fisioterapeuta o Industrial/product designer. [place] slo con los ejercicios de resistencia y haga las repeticiones que su mdico le indique.  Podr experimentar dolor o cansancio muscular, pero el dolor o molestia que trata de eliminar a travs de los ejercicios nunca debe empeorar. Si el dolor empeora, detngase y asegrese de que est siguiendo las directivas correctamente. Si an siente dolor luego de Optometrist lo ajustes necesarios, deber discontinuar el ejercicio hasta que pueda conversar con el profesional sobre el problema. FORTALECIMIENTO - Abdominales profundos - Inclinacin plvica  Recustese sobre una cama firme o sobre el suelo. [place], doble las rodillas de modo que ambas apunten hacia el techo y los pies queden bien apoyados en  el piso.  Tensione la zona baja de los msculos abdominales para presionar la Materials engineer. Este movimiento har rotar su pelvis de modo que el cccix quede hacia arriba y no apuntando a los pies o hacia el piso.  Con una tensin suave y respiracin pareja, mantenga esta posicin durante __________ segundos. Reptalo __________ veces. Realice este estiramiento __________ Vicenta Aly por da.  FORTALECIMIENTO -  Abdominales encogimiento abdominal  Recustese sobre una cama firme o sobre el suelo. [address], doble las rodillas de modo que ambas apunten hacia el techo y los pies queden bien apoyados en el piso. [place].  Apunte suavemente con la barbilla hacia abajo, sin doblar el cuello.  Tensione los abdominales y eleve lentamente el tronco la altura suficiente para despegar los omplatos. Si se eleva ms, pondr tensin excesiva en la cintura y esto no fortalecer ms los abdominales.  Controle la vuelta a la posicin inicial. Reptalo __________ veces. Realice este estiramiento __________ Vicenta Aly por da.  FORTALECIMIENTO - En cuadrpedo, elevacin de miembro superior e inferior opuestos  [employer] y las rodillas en una superficie firme. Las manos deben quedar a la altura de los hombros y las rodillas [place]. Puede colocar algo debajo las rodillas para estar ms cmodo.  Encuentre la posicin neutral de la columna vertebral y Heritage manager los msculos abdominales de modo que pueda mantener esta posicin. Los hombros y las caderas deben formar un rectngulo paralelo con el suelo y recto.  Manteniendo el tronco firme, eleve la mano derecha a la altura del hombro y luego eleve la pierna izquierda a la altura de la cadera. Asegrese de no contener la respiracin. Mantenga cada posicin durante __________ segundos.  Con los msculos abdominales en tensin y la espalda firme, vuelva lentamente a la posicin inicial. Repita con el otro brazo y la otra pierna. Reptalo __________ veces. Realice este estiramiento __________ Vicenta Aly por da.  FUERZA Abdominales y cudriceps - Lexicographer las piernas rectas  Recustese en una cama dura o sobre el piso, con ambas piernas extendidas al frente.  Deje una pierna en contacto con el suelo y doble la otra rodilla de manera que el pie quede contra el suelo.  Encuentre la posicin neutral de la  columna vertebral y Heritage manager los msculos abdominales de modo que pueda mantener esta posicin.  Levante lentamente la pierna del suelo una 6 pulgadas y cuente [address], asegrese de no contener la respiracin.  Mantega la columna en posicin neutral, y baje lentamente la pierna hasta el suelo. Repita el ejercicio con cada pierna __________ veces. Realice este estiramiento __________ Vicenta Aly por da. CONSIDERACIONES ACERCA DE LA POSTURA Y LA MECNICA DEL CUERPO Citica Si mantiene una postura correcta cuando se encuentre de pie, sentado o realizando sus actividades, reducir el [employer] tejidos del cuerpo, y Advertising account executive a los tejidos lesionados la posibilidad de curarse y Engineering geologist las experiencias dolorosas. A continuacin se indican pautas generales para mejorar la postura- Su mdico o fisioterapeuta le dar instrucciones especficas segn sus necesidades. Al leer estas pautas recuerde:  Los ejercicios indicados por su mdico lo ayudarn a Scientist, product/process development flexibilidad y la fuerza para Theatre manager las posturas correctas.  Una postura correcta le proporciona a sus articulaciones el medio ptimo para funcionar bien. Las articulaciones se desgastan menos cuando estn sostenidas adecuadamente por una columna vertebral en buena postura. Esto significa que su cuerpo estar ms sano y Network engineer.  La correcta postura debe practicarse en todas las actividades, especialmente al estar sentado o de pie durante [place]. Tambin es importante al realizar actividades repetitivas de bajo estrs (tipeo) o una actividad nica y pesada. POSICIONES DE Cathe Mons Tenga en cuenta cules son las posturas que ms dolor le causan al elegir una posicin de descanso. Si siente dolor con las actividades en que deba realizar una flexin (sentarse, inclinarse, detenerse, ponerse en cuclillas), elija una posicin que le permita descansar en una postura menos flexionada. Evite curvarse en posicin fetal cuando se  encuentre de lado. Si el dolor empeora con las actividades basadas en la extensin (estar de pie durante un tiempo prolongado, trabajar con las manos por arriba de la cabeza) evite descansar en Ardelia Mems posicin extendida durante mucho tiempo, como dormir sobre el [place]. La [employer] de las Artist cmodo el descanso sobre la columna vertebral en una posicin neutral, ni muy redondeada ni [place]. Recustese sobre su lado en una cama que no est hundida con una almohada entre las rodillas o sobre la espalda con una almohada bajo las rodillas, y sentir [place]. Tenga en cuenta que cualquier posicin en [employer], no importa si es una postura [place], puede provocarle rigidez. POSTURAS CORRECTAS PARA SENTARSE Con el fin de minimizar el estrs y Health and safety inspector en su columna, deber sentarse con la postura correcta. Esto le ayudar a que el cuerpo est ms sano. Recuperar una buena postura es un proceso gradual. Muchas personas pueden trabajar ms cmodas mediante el uso de diferentes soportes hasta que tengan la flexibilidad y la fuerza para mantener esta postura por su cuenta. Al sentarse con la [employer], los odos deben estar sobre los hombros y los hombros [place]. Debe utilizar el respaldo de la silla para apoyar la espalda. La espalda estar en una posicin neutral, ligeramente arqueada. Puede colocar una pequea almohada o toalla doblada en la base de la espalda baja para apoyarse.  Si trabaja en un escritorio, cree un ambiente que le proporciones un buen soporte y [place]. Sin soporte extra, los msculos se fatigan y causan tensin excesiva en las articulaciones y otros tejidos. Tenga en cuenta estas recomendaciones: SILLA:   La silla debe poder deslizarse por debajo del escritorio cuando su espalda tome contacto con el respaldo. Esto le permitir trabajar ms cerca.  La altura de la silla debe permitirle que los ojos tengan el nivel de la parte superior  del monitor y las manos estn ms abajo que los codos. POSICIN DEL CUERPO  Los pies deben tener contacto con el piso. Si no es posible, use un posapies.  Mantenga las [employer] hombros. Esto reducir el estrs en el cuello y en la cintura. POSTURAS INCORRECTAS PARA SENTARSE  Si se siente cansado e incapaz de asumir una postura sentada sana, no se encorve ni se hunda. Esto pone una tensin excesiva en los tejidos de su espalda, y causa ms dao y Social research officer, government.[place] opciones ms saludables se incluyen:  El uso de ms apoyo, como una almohada lumbar.  Cambio de tareas, a algo que demande una posicin vertical o caminar.  Tomar una breve caminata.  Recostarse y Physicist, medical posicin neutral. DE PIE DURANTE UN TIEMPO PROLONGADO E INCLINADO LIGERAMENTE HACIA ADELANTE Cuando deba realizar una tarea que requiera inclinacin hacia adelante estando de pie en el mismo sitio durante mucho tiempo, coloque un pie en un objeto de 2 a 4 pulgadas de alto, para [employer].  Cuando ambos pies estn en el piso, la zona inferior de la espalda tiene a perder su ligera curvatura hacia adentro. Si esta curva se aplana (o se pronuncia demasiado) la espalda y las articulaciones experimentarn demasiado estrs, se fatigarn ms rpidamente y Therapist, sports.  POSTURAS CORRECTAS PARA ESTAR DE PIE Una postura adecuada de pie realizarse en todas las actividades diarias, incluso si slo toman un momento, como al [employer]. Como en la postura de sentado, los odos deben estar sobre los hombros y los hombros [place]. Deber mantener una ligera tensin en sus msculos abdominales para asegurar la columna vertebral. El cccix debe apuntar hacia el suelo, no detrs de su cuerpo, por que resulta en una curvatura de la espalda sobre-extendida.  [place] posturas incorrectas para estar de pie incluyen tener la cabeza hacia delante, las rodillas  bloqueadas o una excesiva curvatura de la espalda. CAMINAR [place] en [place] erguida. Las [place], hombros y caderas deben estar alineados. ACTIVIDAD PROLONGADA EN UNA POSICIN FLEXIONADA Al completar una tarea que requiere que se doble la cintura hacia adelante o inclinarse sobre una superficie baja, trate de encontrar una manera de estabilizar 3 de cada 4 de sus miembros. Puede colocar una mano o el codo en el [place], o descansar una rodilla en la superficie en la que est apoyado. Esto le proporcionar ms estabilidad para que sus msculos no se cansen tan rpidamente. El [employer] rodillas [place], o ligeramente dobladas, tambin reducir el estrs en la espalda baja. TCNICAS CORRECTAS PARA LEVANTAR OBJETOS SI:   Asumir una postura amplia. Esto le proporcionar ms estabilidad y la oportunidad de acercarse lo ms posible al objeto que se est levantando.  Tense los abdominales para asegurar la columna vertebral; luego flexione rodillas y caderas. Manteniendo la espalda en una posicin neutral, haga el esfuerzo con los msculos de la pierna. Levntese con las piernas, manteniendo la espalda derecha.  Pruebe el peso de los objetos desconocidos antes de tratar de Special educational needs teacher.  Trate de [employer] codos hacia abajo y a los lados, con el fin de obtener la fuerza de los hombros al llevar un objeto.  Siempre pida ayuda a otra persona cuando deba levantar objetos pesados o incmodos TCNICAS INCORRECTAS PARA LEVANTAR OBJETOS NO:   Bloquee rodillas al levantar, aunque sea un objeto pequeo.  Se doble ni gire. Gire sobre los pies ni los mueva cuando necesite cambiar de direccin.  Tome conciencia que no puede levantar con seguridad ni un clip de papel, sin Chiropodist. Document Released: 05/26/2009 Document Revised: 08/30/2011 [facility] Patient Information 2015 [place]. This information is not intended to replace advice given to you by your health care provider. Make  sure you discuss any questions you have with your health care provider.

## 2014-06-08 ENCOUNTER — Encounter (HOSPITAL_COMMUNITY): Payer: Self-pay | Admitting: Emergency Medicine

## 2014-06-08 ENCOUNTER — Emergency Department (HOSPITAL_COMMUNITY): Payer: Self-pay

## 2014-06-08 ENCOUNTER — Emergency Department (HOSPITAL_COMMUNITY)
Admission: EM | Admit: 2014-06-08 | Discharge: 2014-06-08 | Disposition: A | Discharge status: Home / Self Care | Payer: Self-pay | Attending: Emergency Medicine | Admitting: Emergency Medicine

## 2014-06-08 DIAGNOSIS — I10 Essential (primary) hypertension: Secondary | ICD-10-CM | POA: Insufficient documentation

## 2014-06-08 DIAGNOSIS — F329 Major depressive disorder, single episode, unspecified: Secondary | ICD-10-CM | POA: Insufficient documentation

## 2014-06-08 DIAGNOSIS — Z8719 Personal history of other diseases of the digestive system: Secondary | ICD-10-CM | POA: Insufficient documentation

## 2014-06-08 DIAGNOSIS — G43909 Migraine, unspecified, not intractable, without status migrainosus: Secondary | ICD-10-CM | POA: Insufficient documentation

## 2014-06-08 DIAGNOSIS — F419 Anxiety disorder, unspecified: Secondary | ICD-10-CM | POA: Insufficient documentation

## 2014-06-08 DIAGNOSIS — Z79899 Other long term (current) drug therapy: Secondary | ICD-10-CM | POA: Insufficient documentation

## 2014-06-08 DIAGNOSIS — R10A1 Flank pain, right side: Secondary | ICD-10-CM

## 2014-06-08 DIAGNOSIS — G8929 Other chronic pain: Secondary | ICD-10-CM | POA: Insufficient documentation

## 2014-06-08 DIAGNOSIS — Z791 Long term (current) use of non-steroidal anti-inflammatories (NSAID): Secondary | ICD-10-CM | POA: Insufficient documentation

## 2014-06-08 DIAGNOSIS — R109 Unspecified abdominal pain: Secondary | ICD-10-CM | POA: Insufficient documentation

## 2014-06-08 DIAGNOSIS — M5431 Sciatica, right side: Secondary | ICD-10-CM

## 2014-06-08 LAB — URINALYSIS, ROUTINE W REFLEX MICROSCOPIC
Bilirubin Urine: NEGATIVE
Glucose, UA: NEGATIVE mg/dL
Hgb urine dipstick: NEGATIVE
Ketones, ur: NEGATIVE mg/dL
LEUKOCYTES UA: NEGATIVE
Nitrite: NEGATIVE
PH: 5.5 (ref 5.0–8.0)
Protein, ur: NEGATIVE mg/dL
SPECIFIC GRAVITY, URINE: 1.014 (ref 1.005–1.030)
Urobilinogen, UA: 0.2 mg/dL (ref 0.0–1.0)

## 2014-06-08 MED ORDER — HYDROCODONE-ACETAMINOPHEN 5-325 MG PO TABS
2.0000 | ORAL_TABLET | Freq: Once | ORAL | Status: AC
  Administered 2014-06-08: 2 via ORAL
  Filled 2014-06-08: qty 2

## 2014-06-08 MED ORDER — DIAZEPAM 5 MG/ML IJ SOLN: 5.0000 mg | Freq: Once | INTRAMUSCULAR | Status: DC

## 2014-06-08 MED ORDER — KETOROLAC TROMETHAMINE 60 MG/2ML IM SOLN
60.0000 mg | Freq: Once | INTRAMUSCULAR | Status: AC
  Administered 2014-06-08: 60 mg via INTRAMUSCULAR
  Filled 2014-06-08: qty 2

## 2014-06-08 MED ORDER — DIAZEPAM 5 MG/ML IJ SOLN
5.0000 mg | Freq: Once | INTRAMUSCULAR | Status: AC
  Administered 2014-06-08: 5 mg via INTRAMUSCULAR
  Filled 2014-06-08: qty 2

## 2014-06-08 MED ORDER — PREDNISONE 20 MG PO TABS: 40.0000 mg | ORAL_TABLET | Freq: Every day | ORAL | Status: DC

## 2014-06-08 MED ORDER — HYDROMORPHONE HCL 1 MG/ML IJ SOLN
1.0000 mg | Freq: Once | INTRAMUSCULAR | Status: AC
  Administered 2014-06-08: 1 mg via INTRAMUSCULAR
  Filled 2014-06-08: qty 1

## 2014-06-08 MED ORDER — DIPHENHYDRAMINE HCL 25 MG PO CAPS
50.0000 mg | ORAL_CAPSULE | Freq: Once | ORAL | Status: AC
Start: 2014-06-08 — End: 2014-06-08
  Administered 2014-06-08: 50 mg via ORAL
  Filled 2014-06-08: qty 2

## 2014-06-08 NOTE — ED Notes (Signed)
Pt ambulated to BR and back to room w/o assistance

## 2014-06-08 NOTE — ED Notes (Signed)
Pt ambulated to BR and back to room w/ assistance of tech

## 2014-06-08 NOTE — ED Notes (Signed)
Pt c/o lower back pain that radiates to abdomen x3 weeks. Pain 10/10.  Pt needs interpreter, spanish speaking. Reports nausea denies vomiting.

## 2014-06-08 NOTE — Discharge Instructions (Signed)
Dolor Radicular (Radicular Pain) El dolor radicular est causado en la irritacin de las races [place]. Generalmente la causa es una degeneracin el un disco de la columna vertebral. Esto produce dolor que se siente en el brazo o la pierna. Entre otras causas del dolor radicular se incluyen:  Designer, jewellery  Neuropatas (un estado anormal y a menudo degenerativo del sistema nervioso o nervios). Para el diagnstico le indicarn una tomografa computarizada o imgenes por resonancia magntica para determinar la causa primaria. Los nervios que comienzan en el cuello (races nerviosas) pueden Psychologist, clinical radicular en la parte exterior del hombro y en el brazo. ste puede extenderse hacia el pulgar y los dedos. Los sntomas varan segn la raz nerviosa afectada. En la mayor parte de los casos mejora en gran medida con un tratamiento conservador. Para los problemas en el cuello le indicarn fisioterapia, un collar o una traccin cervical. El tratamiento puede durar varias semanas y se considerar la posibilidad de una ciruga si los sntomas no mejoran.  El tratamiento conservador tambin se recomienda para los casos de citica que causan dolor que se irradia desde la zona baja de la espalda o nalgas hacia la pierna o el pie. Generalmente hay una historia previa de problemas en la espalda. La mayora de los pacientes mejora completamente luego de 2 a 4 semanas de reposo en cama y otros tratamientos de apoyo. El reposo en cama reduce la presin en el disco considerablemente. El estar sentado no es una buena posicin, ya que aumenta la presin sobre el disco. Evite encorvarse, levantar mucho peso, permanecer sentado por [employer], y las actividades que puedan empeorar el problema. El los casos ms graves se Film/video editor traccin. La ciruga est reservada para los pacientes que no mejoran dentro de los primeros meses de [place]. Utilice los medicamentos de venta libre  o de prescripcin para Conservation officer, historic buildings, Health and safety inspector o la [name], segn se lo indique el profesional que lo asiste. Los narcticos y los relajantes musculares ayudan a Academic librarian dolor ms intenso y los espasmos, proporcionando una sedacin [place]. Venida Jarvis de fro o masajes tambin le brindarn [place]. No se recomienda la manipulacin de la columna vertebral. Esto puede aumentar el grado de protrusin del disco. Las inyecciones epidurales con esteroides son a menudo un tratamiento efectivo para el dolor radicular. Esas inyecciones colocan medicamentos al nervio espinal en el espacio entre la cubierta protectora de la mdula espinal y los huesos de la espalda (vertebrae). El profesional que lo asiste podr darle ms informacin acerca de las inyecciones de esteroides. Estas inyecciones son ms efectivas cuando se administran dentro de las [employer] de la aparicin del Social research officer, government.  Comunquese con su mdico para realizar un control segn las indicaciones. Como parte importante del tratamiento deber seguir un programa de rehabilitacin, con ejercicios de elongacin y fortalecimiento.  SOLICITE ATENCIN MDICA DE INMEDIATO SI:  Presenta siente debilidad o adormecimiento inusual en sus brazos o piernas.  Presenta prdida del control del intestino o de la vejiga.  Presenta dolor abdominal. Document Released: 06/07/2005 Document Revised: 08/30/2011 ExitCare Patient Information 2015 [place], [place]. This information is not intended to replace advice given to you by your health care provider. Make sure you discuss any questions you have with your health care provider.

## 2014-06-08 NOTE — ED Notes (Signed)
Pt in from home. C/o sciatic back pain, seen recently for same. Has meds at home, took this am, do not help. Pain radiates down R leg.
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
## 2014-06-08 NOTE — ED Notes (Signed)
Awake. Verbally responsive. A/O x4. Resp even and unlabored. No audible adventitious breath sounds noted. ABC's intact. SR on monitor at 63bpm. Pt reported having nausea but no vomiting/diarrhea.

## 2014-06-08 NOTE — ED Notes (Addendum)
Pt from home-per interpreter, pt sts that she has sciatica and has been referred to specialist which she has not seen. Pt adds that she is having RLQ 6/10, sciatica pain 10/10. Pt here for pain control. Pt is A&O and in NAD

## 2014-06-08 NOTE — ED Notes (Signed)
Per CT patient is 3rd on the list to be scanned

## 2014-06-08 NOTE — ED Notes (Signed)
Pt had no reaction to IM Dilaudid.

## 2014-06-08 NOTE — ED Notes (Signed)
Awake. Verbally responsive. A/O x4. Resp even and unlabored. No audible adventitious breath sounds noted. ABC's intact. NAD noted. 

## 2014-06-10 NOTE — Progress Notes (Signed)
Sent referral to [facility] staff after speaking with [facility] ED SW about pt wanting an orange card

## 2014-08-05 ENCOUNTER — Other Ambulatory Visit: Payer: Self-pay | Admitting: Internal Medicine

## 2014-08-06 NOTE — Telephone Encounter (Signed)
Called to pharm 

## 2014-08-12 ENCOUNTER — Telehealth: Payer: Self-pay | Admitting: Internal Medicine

## 2014-08-12 NOTE — Telephone Encounter (Signed)
Call to patient to confirm appointment for 08/13/14 at 9:45. [email or web address]

## 2014-08-13 ENCOUNTER — Ambulatory Visit (INDEPENDENT_AMBULATORY_CARE_PROVIDER_SITE_OTHER): Payer: Self-pay | Admitting: Internal Medicine

## 2014-08-13 DIAGNOSIS — Z418 Encounter for other procedures for purposes other than remedying health state: Secondary | ICD-10-CM

## 2014-08-13 DIAGNOSIS — N898 Other specified noninflammatory disorders of vagina: Secondary | ICD-10-CM

## 2014-08-13 DIAGNOSIS — Z299 Encounter for prophylactic measures, unspecified: Secondary | ICD-10-CM | POA: Insufficient documentation

## 2014-08-13 DIAGNOSIS — M5431 Sciatica, right side: Secondary | ICD-10-CM

## 2014-08-13 DIAGNOSIS — G8929 Other chronic pain: Secondary | ICD-10-CM | POA: Insufficient documentation

## 2014-08-13 DIAGNOSIS — R1013 Epigastric pain: Secondary | ICD-10-CM

## 2014-08-13 DIAGNOSIS — M5430 Sciatica, unspecified side: Secondary | ICD-10-CM | POA: Insufficient documentation

## 2014-08-13 DIAGNOSIS — M5416 Radiculopathy, lumbar region: Secondary | ICD-10-CM | POA: Insufficient documentation

## 2014-08-13 LAB — COMPREHENSIVE METABOLIC PANEL
ALT: 16 U/L (ref 0–35)
AST: 15 U/L (ref 0–37)
Albumin: 4.3 g/dL (ref 3.5–5.2)
Alkaline Phosphatase: 66 U/L (ref 39–117)
BUN: 14 mg/dL (ref 6–23)
CALCIUM: 9.3 mg/dL (ref 8.4–10.5)
CO2: 24 mEq/L (ref 19–32)
Chloride: 106 mEq/L (ref 96–112)
Creat: 0.6 mg/dL (ref 0.50–1.10)
GLUCOSE: 97 mg/dL (ref 70–99)
Potassium: 4.5 mEq/L (ref 3.5–5.3)
Sodium: 136 mEq/L (ref 135–145)
TOTAL PROTEIN: 6.8 g/dL (ref 6.0–8.3)
Total Bilirubin: 0.3 mg/dL (ref 0.2–1.2)

## 2014-08-13 LAB — CBC WITH DIFFERENTIAL/PLATELET
BASOS ABS: 0 10*3/uL (ref 0.0–0.1)
Basophils Relative: 0 % (ref 0–1)
EOS ABS: 0.1 10*3/uL (ref 0.0–0.7)
EOS PCT: 1 % (ref 0–5)
HEMATOCRIT: 35.7 % — AB (ref 36.0–46.0)
HEMOGLOBIN: 11.8 g/dL — AB (ref 12.0–15.0)
Lymphocytes Relative: 25 % (ref 12–46)
Lymphs Abs: 2.1 10*3/uL (ref 0.7–4.0)
MCH: 30.8 pg (ref 26.0–34.0)
MCHC: 33.1 g/dL (ref 30.0–36.0)
MCV: 93.2 fL (ref 78.0–100.0)
MPV: 10.5 fL (ref 8.6–12.4)
Monocytes Absolute: 0.5 10*3/uL (ref 0.1–1.0)
Monocytes Relative: 6 % (ref 3–12)
Neutro Abs: 5.6 10*3/uL (ref 1.7–7.7)
Neutrophils Relative %: 68 % (ref 43–77)
PLATELETS: 276 10*3/uL (ref 150–400)
RBC: 3.83 MIL/uL — AB (ref 3.87–5.11)
RDW: 14 % (ref 11.5–15.5)
WBC: 8.3 10*3/uL (ref 4.0–10.5)

## 2014-08-13 LAB — LIPID PANEL
CHOLESTEROL: 157 mg/dL (ref 0–200)
HDL: 52 mg/dL (ref >=46)
LDL CALC: 75 mg/dL (ref 0–99)
TRIGLYCERIDES: 150 mg/dL — AB (ref <150)
Total CHOL/HDL Ratio: 3 Ratio
VLDL: 30 mg/dL (ref 0–40)

## 2014-08-13 MED ORDER — CLONAZEPAM 1 MG PO TABS: 1.0000 mg | ORAL_TABLET | Freq: Every day | ORAL | Status: DC

## 2014-08-13 MED ORDER — METHOCARBAMOL 500 MG PO TABS: 500.0000 mg | ORAL_TABLET | Freq: Two times a day (BID) | ORAL | Status: DC | PRN

## 2014-08-13 MED ORDER — NAPROXEN 500 MG PO TABS: 500.0000 mg | ORAL_TABLET | Freq: Two times a day (BID) | ORAL | Status: DC

## 2014-08-13 MED ORDER — FLUCONAZOLE 150 MG PO TABS: 150.0000 mg | ORAL_TABLET | ORAL | Status: DC

## 2014-08-13 MED ORDER — PANTOPRAZOLE SODIUM 40 MG PO TBEC: 40.0000 mg | DELAYED_RELEASE_TABLET | Freq: Every day | ORAL | Status: DC

## 2014-08-13 MED ORDER — KETOROLAC TROMETHAMINE 30 MG/ML IJ SOLN
30.0000 mg | Freq: Once | INTRAMUSCULAR | Status: AC
  Administered 2014-08-13: 30 mg via INTRAMUSCULAR
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
## 2014-08-13 NOTE — Assessment & Plan Note (Signed)
Patient declined exam and STI investigation. Patient has had a history of yeast infections in the past. -Empirically treat with fluconazole -Follow-up if fails to improve

## 2014-08-13 NOTE — Patient Instructions (Signed)
General Instructions:   Please bring your medicines with you each time you come to clinic.  Medicines may include prescription medications, over-the-counter medications, herbal remedies, eye drops, vitamins, or other pills.   Progress Toward Treatment Goals:  No flowsheet data found.  Self Care Goals & Plans:  No flowsheet data found.  No flowsheet data found.   Care Management & Community Referrals:  Referral 06/26/2013  Referrals made to community resources transportation; other (see comments)

## 2014-08-13 NOTE — Assessment & Plan Note (Addendum)
This has been an ongoing problem for the patient. She has tried physical therapy, supportive measure and NSAIDs and now steroids with little to no success. -Referral to sports medicine -Continue with naproxen for pain relief and robaxin muscle relaxant until evaluation with sports medicine  -Toradol IM 30mg  given in clinic

## 2014-08-13 NOTE — Assessment & Plan Note (Signed)
This has likely been exacerbated in the setting of steroids and NSAID use for her chronic sciatica. The patient has also been out of her medication. She does not have any red flag symptoms today.  Wt Readings from Last 3 Encounters:  05/06/14 122 lb (55.339 kg)  04/04/14 115 lb (52.164 kg)  03/04/14 125 lb (56.7 kg)   -Start Protonix 40 mg twice a day and can titrate down to daily -CMet and CBC -Patient was provided with food and pain tree resources by CSW during this visit

## 2014-08-14 NOTE — Progress Notes (Signed)
Internal Medicine Clinic Attending  Case discussed with Dr. Sadek soon after the resident saw the patient.  We reviewed the resident's history and exam and pertinent patient test results.  I agree with the assessment, diagnosis, and plan of care documented in the resident's note. 

## 2014-08-19 ENCOUNTER — Ambulatory Visit (INDEPENDENT_AMBULATORY_CARE_PROVIDER_SITE_OTHER): Payer: No Typology Code available for payment source | Admitting: Family Medicine

## 2014-08-19 ENCOUNTER — Encounter: Payer: Self-pay | Admitting: Family Medicine

## 2014-08-19 ENCOUNTER — Ambulatory Visit: Payer: Self-pay

## 2014-08-19 VITALS — BP 119/73 | Ht 62.0 in | Wt 116.0 lb

## 2014-08-19 DIAGNOSIS — M545 Low back pain: Secondary | ICD-10-CM

## 2014-08-19 DIAGNOSIS — M5431 Sciatica, right side: Secondary | ICD-10-CM

## 2014-08-19 MED ORDER — GABAPENTIN 100 MG PO CAPS: ORAL_CAPSULE | ORAL | Status: DC

## 2014-08-19 NOTE — Patient Instructions (Signed)
Este probablamente es sciatica. Empieza con gabapentin 100mg  cada noche y en 2-3 dias toma 200 mg cada noche. En 2-3 dias empieza con 300mg  diario y continua con este dosis. Este medicina es para dolor de nervios. Estamos haciendo cita para MRI del region lumbar en su espalda.  Ir a sala de emergencia si tiene orinacion o haciendo bano sin control, tiene entumecimiento o hormigueos de su genitales, o si tiene mucho debilidad de sus piernas.  [place].

## 2014-08-20 NOTE — Progress Notes (Signed)
[facility] Attending Note: I have seen and examined this patient. I have discussed this patient with the resident and reviewed the assessment and plan as documented above. I agree with the resident's findings and plan. We offered the patient a course of oral steroids. She told that she had early done that back in November. She and her husband more somewhat frustrated that in their estimation, people keep giving her medications that do not seem to help. With the help of the interpreter, I tried to reassure them that we were taking her very seriously and explained to them the need for the MRI before we proceed any further. Ultimately she did agree to try gabapentin although obtaining it from a financial standpoint may be a problem. Certainly would be cheaper than Lyrica. If gabapentin is not option, I would consider amitriptyline. I think proceeding with MRIs the best course at this time.

## 2014-08-20 NOTE — Assessment & Plan Note (Addendum)
Radiculopathy with some questionable (inconsistent exam) weakness of right lower extremity on exam, no perineal numbness or bowel/bladder symptoms. Concern for possible  Disk pathology at L4-5 or L5-S1. - MRI without contrast lumbar spine. Scheduled with patient. - If positive, could consider referring for epidural steroid injection - Discussed that if positive, surgery would be last resort - Gabapentin 100mg  QHS; increase over 10-14 days to 300mg  QHS.  Pt to try at [facility] and call if too expensive to try MAP. Mentioned to her that she could ask her PCP about qualifying for this through her [facility] discount. - F/u 1 week after MRI to review imaging findings.
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
## 2014-08-22 ENCOUNTER — Ambulatory Visit: Payer: Self-pay

## 2014-08-26 ENCOUNTER — Ambulatory Visit: Payer: Self-pay

## 2014-08-27 ENCOUNTER — Ambulatory Visit
Admission: RE | Admit: 2014-08-27 | Discharge: 2014-08-27 | Disposition: A | Discharge status: Home / Self Care | Payer: No Typology Code available for payment source | Source: Ambulatory Visit | Attending: Family Medicine | Admitting: Family Medicine

## 2014-08-27 DIAGNOSIS — M545 Low back pain: Secondary | ICD-10-CM

## 2014-08-28 ENCOUNTER — Telehealth: Payer: Self-pay | Admitting: *Deleted

## 2014-08-29 ENCOUNTER — Encounter: Payer: Self-pay | Admitting: Family Medicine

## 2014-08-29 ENCOUNTER — Other Ambulatory Visit: Payer: Self-pay | Admitting: *Deleted

## 2014-08-29 DIAGNOSIS — M545 Low back pain, unspecified: Secondary | ICD-10-CM

## 2014-08-29 NOTE — Telephone Encounter (Signed)
[name] Here is her MRI results. She has some discs bulging--this MAY be causing her pain, maybe not. Only thing I can offer is either: 1. Referral to ortho or neurosurg (not sure of her insurance) OR 2. Epidural steroid injections.  I do not need to see her back for follow up [facility]! 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 Query L4 or L5 radiculitis.

## 2014-08-30 ENCOUNTER — Other Ambulatory Visit: Payer: Self-pay | Admitting: Family Medicine

## 2014-08-30 DIAGNOSIS — M545 Low back pain, unspecified: Secondary | ICD-10-CM

## 2014-08-30 DIAGNOSIS — G8929 Other chronic pain: Secondary | ICD-10-CM

## 2014-09-02 ENCOUNTER — Ambulatory Visit
Admission: RE | Admit: 2014-09-02 | Discharge: 2014-09-02 | Disposition: A | Discharge status: Home / Self Care | Payer: No Typology Code available for payment source | Source: Ambulatory Visit | Attending: Family Medicine | Admitting: Family Medicine

## 2014-09-02 ENCOUNTER — Ambulatory Visit: Payer: No Typology Code available for payment source | Admitting: Family Medicine

## 2014-09-02 DIAGNOSIS — G8929 Other chronic pain: Secondary | ICD-10-CM

## 2014-09-02 DIAGNOSIS — M545 Low back pain: Principal | ICD-10-CM

## 2014-09-02 MED ORDER — METHYLPREDNISOLONE ACETATE 40 MG/ML INJ SUSP (RADIOLOG
120.0000 mg | Freq: Once | INTRAMUSCULAR | Status: AC
  Administered 2014-09-02: 120 mg via EPIDURAL

## 2014-09-02 MED ORDER — IOHEXOL 180 MG/ML  SOLN
1.0000 mL | Freq: Once | INTRAMUSCULAR | Status: AC | PRN
  Administered 2014-09-02: 1 mL via EPIDURAL

## 2014-09-02 NOTE — Discharge Instructions (Signed)

## 2014-09-03 ENCOUNTER — Ambulatory Visit: Payer: Self-pay

## 2014-09-04 ENCOUNTER — Telehealth: Payer: Self-pay | Admitting: *Deleted

## 2014-09-04 NOTE — Telephone Encounter (Signed)
Patient called and said she was still having pain down her leg after the injection on Monday.  She will see if it gets better by the end of the week if not she will call to make an appt with [place] for next week.

## 2014-09-06 ENCOUNTER — Encounter: Payer: Self-pay | Admitting: Internal Medicine

## 2014-09-10 ENCOUNTER — Ambulatory Visit: Payer: No Typology Code available for payment source

## 2014-09-24 ENCOUNTER — Ambulatory Visit (INDEPENDENT_AMBULATORY_CARE_PROVIDER_SITE_OTHER): Payer: Self-pay | Admitting: Internal Medicine

## 2014-09-24 ENCOUNTER — Encounter: Payer: Self-pay | Admitting: Internal Medicine

## 2014-09-24 VITALS — BP 109/58 | HR 52 | Temp 97.6°F | Wt 112.2 lb

## 2014-09-24 DIAGNOSIS — M5431 Sciatica, right side: Secondary | ICD-10-CM

## 2014-09-24 NOTE — Patient Instructions (Signed)
1. Our office will call you with your specialist appointment to discussion further management of your back pain.  Please come back to see Dr. Algis Liming in 1 month.    2. Please take all medications as prescribed.    3. If you have worsening of your symptoms or new symptoms arise, please call the clinic ([phone]), or go to the ER immediately if symptoms are severe.
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
## 2014-09-26 NOTE — Assessment & Plan Note (Addendum)
Patient here with several chronic complaints but I explained that we could focus on what was bothering her the most and that she should come back soon to see her PCP for follow-up of other chronic issues.  She agreed and chose to focus on continued back pain.  She describes right LE radicular pain that is unchanged.  She denies saddle anesthesia, loss of bowel or bladder or worsening pain.  + right straight leg raise exam, otherwise, no neuro deficits on exam.  She has been evaluated by her PCP and Sports Medicine.  Recent MRI revealed multi-level thoracic and lumbar disce herniation.  This is likely the cause of her pain and exam findings.  Sports Med recommended steroid injections or referral to Ortho or Neurosurg.  The patient says she has gotten the injections and they worked for about two weeks.  Overall, she says her pain has improved but is still present.  She is not taking the gabapentin, NSAID or Robaxin that were previously prescribed.  - referral to Ortho (will need to check if she has or is applying for [employer] - I will check with Marlana Latus) - return for follow-up with PCP in 1 month (including follow-up of other chronic complaints)

## 2014-09-26 NOTE — Progress Notes (Signed)
Internal Medicine Clinic Attending  Case discussed with Dr. Wilson soon after the resident saw the patient.  We reviewed the resident's history and exam and pertinent patient test results.  I agree with the assessment, diagnosis, and plan of care documented in the resident's note.  

## 2014-09-27 ENCOUNTER — Telehealth: Payer: Self-pay | Admitting: *Deleted

## 2014-09-27 NOTE — Telephone Encounter (Signed)
SPOKE WITH Karen [name] CONCERNING HER ORTHO REFERRAL, THEN SHE HAD ME TO SPEAK WITH HER HUSBAND ([name])BECAUSE SHE DIDN'T UNDERSTAND WHAT I WAS SAYING.  [facility] DO NOT HAVE ANY ORTHO REFERRAL OPENING FOR THIS MONTH. SHE COULD EITHER WAIT FOR NEXT MONTH REFERRAL OR EITHER BE REFERRED TO [facility].[name] IS WILLING TO WAIT UNTIL NEXT MONTH.

## 2014-10-30 ENCOUNTER — Encounter: Payer: Self-pay | Admitting: *Deleted

## 2014-11-01 ENCOUNTER — Encounter: Payer: Self-pay | Admitting: Internal Medicine

## 2014-11-04 ENCOUNTER — Telehealth: Payer: Self-pay | Admitting: Internal Medicine

## 2014-11-04 NOTE — Telephone Encounter (Signed)
Call to patient to confirm appointment for 11/05/14 at 9:45 lmtcb

## 2014-11-05 ENCOUNTER — Encounter: Payer: Self-pay | Admitting: Internal Medicine

## 2014-11-07 ENCOUNTER — Ambulatory Visit: Payer: Self-pay | Admitting: Internal Medicine

## 2014-11-13 ENCOUNTER — Telehealth: Payer: Self-pay | Admitting: Internal Medicine

## 2014-11-13 NOTE — Telephone Encounter (Signed)
Call to patient to confirm appointment for 11/14/14 at 2:45 lmtcb

## 2014-11-14 ENCOUNTER — Ambulatory Visit (INDEPENDENT_AMBULATORY_CARE_PROVIDER_SITE_OTHER): Payer: Self-pay | Admitting: Internal Medicine

## 2014-11-14 ENCOUNTER — Encounter: Payer: Self-pay | Admitting: Internal Medicine

## 2014-11-14 VITALS — BP 117/74 | HR 48 | Temp 97.6°F | Ht 62.0 in | Wt 108.2 lb

## 2014-11-14 DIAGNOSIS — Z599 Problem related to housing and economic circumstances, unspecified: Secondary | ICD-10-CM

## 2014-11-14 DIAGNOSIS — M5431 Sciatica, right side: Secondary | ICD-10-CM

## 2014-11-14 DIAGNOSIS — Z598 Other problems related to housing and economic circumstances: Secondary | ICD-10-CM

## 2014-11-14 MED ORDER — KETOROLAC TROMETHAMINE 30 MG/ML IJ SOLN: 30.0000 mg | Freq: Once | INTRAMUSCULAR | Status: DC

## 2014-11-14 MED ORDER — MELOXICAM 15 MG PO TABS: 15.0000 mg | ORAL_TABLET | Freq: Every day | ORAL | Status: DC

## 2014-11-14 MED ORDER — KETOROLAC TROMETHAMINE 30 MG/ML IJ SOLN
30.0000 mg | Freq: Once | INTRAMUSCULAR | Status: AC
  Administered 2014-11-14: 30 mg via INTRAMUSCULAR

## 2014-11-14 NOTE — Addendum Note (Signed)
Addended by: Sander Nephew F on: 11/14/2014 04:27 PM   Modules accepted: Orders

## 2014-11-14 NOTE — Assessment & Plan Note (Signed)
Continues to have financial difficulties and has been without food for significant amount of time. Supplemental information regarding food shelves and free meals around [place] was provided. -Referral to social work  For [employer]

## 2014-11-14 NOTE — Progress Notes (Signed)
Case discussed with Dr. Algis Liming at time of visit. We reviewed the resident's history and exam and pertinent patient test results. I agree with the assessment, diagnosis, and plan of care documented in the resident's note.  Please note sterile = steroid and Mobitz = mobic in Dr. Gwenlyn Perking note.

## 2014-11-14 NOTE — Assessment & Plan Note (Signed)
Patient continues to have right-sided weakness on exam and she has had refractory symptoms to greater than 6 weeks of intervention. 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 Query L4 or L5 radiculitis. The patient had previously been on a sterile taper by the sports medicine physician but had minimal to no relief. Given her financial difficulties the patient was given IM Toradol 30 mg shot in the clinic and extensive discussion about plan of action for pain control was had with the patient. The risks outweigh the benefits for other systemic sterile and that is why this option was not pursued. -IM Toradol 30 mg in clinic -Neurosurgery referral as this is mostly a surgical issue -Mobitz 15 mg daily prescription and $4 provided to cover this cost -f/u in 4 wks
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
## 2014-11-14 NOTE — Patient Instructions (Signed)
General Instructions:   Please try to bring all your medicines next time. This will help [place] keep you safe from mistakes.   For your back we will call a neurosurgeon to get an appt.   Take the medicine everyday    Progress Toward Treatment Goals:  No flowsheet data found.  Self Care Goals & Plans:  No flowsheet data found.  No flowsheet data found.   Care Management & Community Referrals:  Referral 06/26/2013  Referrals made to community resources transportation; other (see comments)

## 2014-11-15 ENCOUNTER — Telehealth: Payer: Self-pay | Admitting: Licensed Clinical Social Worker

## 2014-11-15 NOTE — Telephone Encounter (Signed)
Female friend called on behalf of Karen [name].  Friend states, pt in need of bus passes to be mailed to her for upcoming medical appointment.  CSW informed friend, can not place bus passes in the mail, but can provide pt with bus pass home.  CSW then inquired if [name] was current with her [facility] card.  Friend states "yes".  CSW informed friend with active [facility] card, free  Medical/pharmacy transportation is provided.  Confirmed address and CSW completed transportation application and faxed to [place].  Friend aware CSW will send information regarding this transportation resource.  Letter mailed with [employer] brochure.

## 2014-11-19 ENCOUNTER — Telehealth: Payer: Self-pay | Admitting: Licensed Clinical Social Worker

## 2014-11-19 NOTE — Telephone Encounter (Signed)
Female friend calling on behalf of [name] inquiring when pt can come to pick up bus passes.  CSW once again informed friend, pt will need to use [facility] transportation.  Pt able to schedule and Spanish option is available.  Friend then remembered prior conversation with this worker.  Friend states he did write down the number to transportation and is aware it is only for medical appointments and pharmacy.

## 2014-12-09 ENCOUNTER — Telehealth: Payer: Self-pay | Admitting: *Deleted

## 2014-12-09 NOTE — Telephone Encounter (Signed)
Pharm sends request for zolpidem '5mg'$  #30, 1 tablet at hs as needed, i do not see it on her medlist, please advise

## 2014-12-10 NOTE — Telephone Encounter (Signed)
I would not prescribe this to her at this time. It was discontinued in February 2016. She will need to make an appointment and discuss with a physician if she wants this medication renewed

## 2014-12-12 ENCOUNTER — Other Ambulatory Visit: Payer: Self-pay | Admitting: Internal Medicine

## 2014-12-17 ENCOUNTER — Telehealth: Payer: Self-pay | Admitting: *Deleted

## 2014-12-17 ENCOUNTER — Encounter: Payer: Self-pay | Admitting: Internal Medicine

## 2014-12-17 ENCOUNTER — Ambulatory Visit (INDEPENDENT_AMBULATORY_CARE_PROVIDER_SITE_OTHER): Payer: Self-pay | Admitting: Internal Medicine

## 2014-12-17 DIAGNOSIS — G894 Chronic pain syndrome: Secondary | ICD-10-CM

## 2014-12-17 DIAGNOSIS — M5431 Sciatica, right side: Secondary | ICD-10-CM

## 2014-12-17 DIAGNOSIS — M5441 Lumbago with sciatica, right side: Secondary | ICD-10-CM

## 2014-12-17 MED ORDER — ZOLPIDEM TARTRATE 5 MG PO TABS: 5.0000 mg | ORAL_TABLET | Freq: Every evening | ORAL | Status: DC | PRN

## 2014-12-17 MED ORDER — CLONAZEPAM 1 MG PO TABS: 1.0000 mg | ORAL_TABLET | Freq: Every day | ORAL | Status: DC

## 2014-12-17 MED ORDER — DICLOFENAC SODIUM 75 MG PO TBEC: 75.0000 mg | DELAYED_RELEASE_TABLET | Freq: Two times a day (BID) | ORAL | Status: DC

## 2014-12-17 NOTE — Assessment & Plan Note (Signed)
Trial of Diclofenac
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
## 2014-12-17 NOTE — Patient Instructions (Addendum)
General Instructions:  I will try to see about getting you another round of back injections.  In the mean time start taking Diclofenac '75mg'$  twice a day.  Please bring your medicines with you each time you come to clinic.  Medicines may include prescription medications, over-the-counter medications, herbal remedies, eye drops, vitamins, or other pills.   Progress Toward Treatment Goals:  No flowsheet data found.  Self Care Goals & Plans:  No flowsheet data found.  No flowsheet data found.   Care Management & Community Referrals:  Referral 06/26/2013  Referrals made to community resources transportation; other (see comments)

## 2014-12-17 NOTE — Telephone Encounter (Signed)
Pt came back to the clinic - stated Diclofenac too expensive at [place]. I called [facility] [address] - stated med is the $4 list; Diclofenac 75 mg twice a day called to this pharmacy as a verbal order.

## 2014-12-18 NOTE — Assessment & Plan Note (Addendum)
-   Patient with chronic low back pain and right side sciatica. Per chart review and per patient she has tried and has not tolerated Mobic, Ibuprofen, Naproxen. - She has been referred to both NeuroSx and Orthopaedic Sx, she reports her work is too busy at this time to take off for surgery and that she may be able to take time off for surgery in the winter.  She would like another epidural steroid injection, I cannot see any records of where this happened but she apparently had some relief with the steroid injection.  It does appear that she was referred to IR here for that reason by Sports medicine but I do not see that it actually happened here at [facility].  Plan - Trial of Diclofenac '75mg'$  BID (reassured this is on 4$ list at [facility]) - Continue Klonopin - Will place referral for epidural steroid injection.

## 2014-12-20 NOTE — Progress Notes (Signed)
Internal Medicine Clinic Attending  Case discussed with Dr. Hoffman soon after the resident saw the patient.  We reviewed the resident's history and exam and pertinent patient test results.  I agree with the assessment, diagnosis, and plan of care documented in the resident's note. 

## 2014-12-27 NOTE — Addendum Note (Signed)
Addended by: Hulan Fray on: 12/27/2014 06:45 PM   Modules accepted: Orders

## 2014-12-27 NOTE — Addendum Note (Signed)
Addended by: Hulan Fray on: 12/27/2014 06:47 PM   Modules accepted: Orders

## 2015-01-03 ENCOUNTER — Other Ambulatory Visit: Payer: Self-pay | Admitting: Internal Medicine

## 2015-01-15 ENCOUNTER — Other Ambulatory Visit: Payer: Self-pay | Admitting: Internal Medicine

## 2015-01-15 ENCOUNTER — Encounter: Payer: Self-pay | Admitting: Internal Medicine

## 2015-01-15 ENCOUNTER — Ambulatory Visit (INDEPENDENT_AMBULATORY_CARE_PROVIDER_SITE_OTHER): Payer: Self-pay | Admitting: Internal Medicine

## 2015-01-15 VITALS — BP 115/65 | HR 50 | Temp 98.2°F | Wt 108.2 lb

## 2015-01-15 DIAGNOSIS — G894 Chronic pain syndrome: Secondary | ICD-10-CM

## 2015-01-15 DIAGNOSIS — M5431 Sciatica, right side: Secondary | ICD-10-CM

## 2015-01-15 DIAGNOSIS — R197 Diarrhea, unspecified: Secondary | ICD-10-CM

## 2015-01-15 DIAGNOSIS — M5125 Other intervertebral disc displacement, thoracolumbar region: Secondary | ICD-10-CM

## 2015-01-15 DIAGNOSIS — M5432 Sciatica, left side: Principal | ICD-10-CM

## 2015-01-15 DIAGNOSIS — N898 Other specified noninflammatory disorders of vagina: Secondary | ICD-10-CM

## 2015-01-15 DIAGNOSIS — M5441 Lumbago with sciatica, right side: Secondary | ICD-10-CM

## 2015-01-15 LAB — CBC
HCT: 35.1 % — ABNORMAL LOW (ref 36.0–46.0)
HEMOGLOBIN: 11.5 g/dL — AB (ref 12.0–15.0)
MCH: 30 pg (ref 26.0–34.0)
MCHC: 32.8 g/dL (ref 30.0–36.0)
MCV: 91.6 fL (ref 78.0–100.0)
MPV: 9.8 fL (ref 8.6–12.4)
Platelets: 285 10*3/uL (ref 150–400)
RBC: 3.83 MIL/uL — AB (ref 3.87–5.11)
RDW: 13.7 % (ref 11.5–15.5)
WBC: 7.9 10*3/uL (ref 4.0–10.5)

## 2015-01-15 MED ORDER — KETOROLAC TROMETHAMINE 30 MG/ML IM SOLN: 30.0000 mg | Freq: Once | INTRAMUSCULAR | Status: DC

## 2015-01-15 MED ORDER — KETOROLAC TROMETHAMINE 30 MG/ML IJ SOLN
30.0000 mg | Freq: Once | INTRAMUSCULAR | Status: AC
Start: 2015-01-15 — End: 2015-01-15
  Administered 2015-01-15: 30 mg via INTRAMUSCULAR

## 2015-01-15 MED ORDER — OXYCODONE-ACETAMINOPHEN 5-325 MG PO TABS: 1.0000 | ORAL_TABLET | Freq: Four times a day (QID) | ORAL | Status: DC | PRN

## 2015-01-15 NOTE — Assessment & Plan Note (Addendum)
Patient states that she is having vaginal discharge for up to 2 months now. She describes it as thick in consistency, brown, and malodorous. She is not sexually active. She has history of hysterectomy and left oophorectomy. She has tested positive for Garnerella in the past. She denies any vaginal bleeding, rash, or sores. Vaginal exam is positive only for thin white discharge seen in the vaginal canal. Cervix is not observed.   -Wet prep, GC  ADDENDUM: Patient is tested POSITIVE for Chlamydia, NEGATIVE for Gonorrhea. Will send prescription of Azithromycin total 1 gram to be taken by mouth one time. She is Spanish speaking, we will work to contact her with Optometrist and counsel her on results as well as safe sex practices and the need for her partner(s) to be treated.  Also positive for Gardnerella. Will start Flagyl 750 mg once a day for 7 days.

## 2015-01-15 NOTE — Assessment & Plan Note (Addendum)
Patient has history of chronic back pain w/ right sided sciatica. 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 Query L4 or L5 radiculitis. She states her pain is bad enough that she wanted to go straight to the ED, but was told to come to see [place] in clinic instead. She has tried Mobic, Ibuprofen, Naproxen, and most recently Diclofenac without relief. When asked if she has tried Gabapentin, she is unsure but states that she does not want to try it after she is informed that she had been prescribed it in the past. She states the only thing she can recall that has provided relief is an epidural steroid shot. She is already scheduled to have this in 5 days. She says that she is also having muscle pain in her arms and back as well as joint pain.  Patient was previously referred to orthopedics and neurosurgery but states that she due to her schedule she will not be able to surgery until the winter. She is asking to have another referral. Latest referral was last month.  -Will hold off on referral again as patient is non-committal on attending appointment. Will consider again in future once her schedule is more allowable. We did discuss with her that surgery would be of the most benefit for her pain, but she states that she will have to wait.  -Toradol 30 mg IM given once in clinic. After this patient states that this is relieving her pain. -Will give Oxycodone-Acetaminophen (Roxicet) 5-325 mg #10 1 tablet every 6 hours only as needed with no refill. Short course only to hold over until she has epidural steroid in 5 days.  -On further review of her chart, she has some red flags for narcotic use in the past. Patient is given only #10 of Roxicet without refill. She is told to only take as needed. She does have some  associated pruritis with use and is told to take OTC    Benadryl for this.

## 2015-01-15 NOTE — Assessment & Plan Note (Signed)
Patient states that she is having diarrhea for the last 8 days. She states that her appetite is decreased and only eats about one and a half meals a day. She states she is also having nausea and one episode of vomiting this morning. Denies hematemesis or hematochezia. She does not have sick contacts. She does mention that about a month ago she had a tick bite on her lower back which caused some rash and pruritis at the time which she thinks may be related. Her history is poor and does not seem like this is the cause but will evaluate liver enzymes and electrolytes.   -Check CMP, CBC, and C. Diff PCR

## 2015-01-15 NOTE — Progress Notes (Signed)
Internal Medicine Clinic Attending  I saw and evaluated the patient.  I personally confirmed the key portions of the history and exam documented by Dr. Zada Finders and I reviewed pertinent patient test results.  The assessment, diagnosis, and plan were formulated together and I agree with the documentation in the resident's note.  Vaginal exam and specimen collection for wet prep performed by Dr Posey Pronto, and supervised by me.

## 2015-01-15 NOTE — Patient Instructions (Signed)
Thank you for your visit.  For your pain, we gave you a one time shot of Toradol in clinic today.  I will prescribe a little bit of Percocet, take this once every 6 hours for your pain ONLY IF NEEDED.  We are doing some blood and cervical tests for your diarrhea and discharge.  We also will need to collect a stool sample to assess your diarrhea.  Please call us if your symptoms worsen or do not improve.
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
## 2015-01-16 LAB — CERVICOVAGINAL ANCILLARY ONLY
CHLAMYDIA, DNA PROBE: POSITIVE — AB
Neisseria Gonorrhea: NEGATIVE

## 2015-01-16 LAB — COMPLETE METABOLIC PANEL WITH GFR
ALK PHOS: 56 U/L (ref 33–115)
ALT: 17 U/L (ref 6–29)
AST: 22 U/L (ref 10–35)
Albumin: 4.4 g/dL (ref 3.6–5.1)
BILIRUBIN TOTAL: 0.3 mg/dL (ref 0.2–1.2)
BUN: 15 mg/dL (ref 7–25)
CALCIUM: 9.2 mg/dL (ref 8.6–10.2)
CHLORIDE: 103 meq/L (ref 98–110)
CO2: 27 mEq/L (ref 20–31)
CREATININE: 0.67 mg/dL (ref 0.50–1.10)
GFR, Est African American: >89 mL/min (ref >=60)
Glucose, Bld: 66 mg/dL (ref 65–99)
Potassium: 3.9 mEq/L (ref 3.5–5.3)
Sodium: 138 mEq/L (ref 135–146)
Total Protein: 6.7 g/dL (ref 6.1–8.1)

## 2015-01-16 LAB — CLOSTRIDIUM DIFFICILE BY PCR: Toxigenic C. Difficile by PCR: NOT DETECTED

## 2015-01-17 ENCOUNTER — Other Ambulatory Visit: Payer: Self-pay | Admitting: Internal Medicine

## 2015-01-17 ENCOUNTER — Telehealth: Payer: Self-pay | Admitting: *Deleted

## 2015-01-17 LAB — CERVICOVAGINAL ANCILLARY ONLY: WET PREP (BD AFFIRM): POSITIVE — AB

## 2015-01-17 MED ORDER — AZITHROMYCIN 500 MG PO TABS: 500.0000 mg | ORAL_TABLET | Freq: Once | ORAL | Status: AC

## 2015-01-17 MED ORDER — METRONIDAZOLE ER 750 MG PO TB24: 750.0000 mg | ORAL_TABLET | Freq: Every day | ORAL | Status: AC

## 2015-01-17 NOTE — Telephone Encounter (Signed)
Pt was called by Spero Geralds (interpreter) and made aware that she tested positive for chlamydia and MD sent  rx for azithromycin 1 gram x one.  [facility]. Pt advised to have her partner tested and to use protection to prevent repeat infections. Pt agreed.     Labs also show that pt has gardnerella

## 2015-01-17 NOTE — Addendum Note (Signed)
Addended by: Lenore Cordia on: 01/17/2015 01:29 PM   Modules accepted: Orders

## 2015-01-17 NOTE — Addendum Note (Signed)
Addended by: Lenore Cordia on: 01/17/2015 09:59 AM   Modules accepted: Orders

## 2015-01-20 ENCOUNTER — Ambulatory Visit
Admission: RE | Admit: 2015-01-20 | Discharge: 2015-01-20 | Disposition: A | Discharge status: Home / Self Care | Payer: No Typology Code available for payment source | Source: Ambulatory Visit | Attending: Internal Medicine | Admitting: Internal Medicine

## 2015-01-20 DIAGNOSIS — M5431 Sciatica, right side: Secondary | ICD-10-CM

## 2015-01-20 DIAGNOSIS — M5432 Sciatica, left side: Principal | ICD-10-CM

## 2015-01-20 MED ORDER — IOHEXOL 180 MG/ML  SOLN
1.0000 mL | Freq: Once | INTRAMUSCULAR | Status: AC | PRN
Start: 2015-01-20 — End: 2015-01-20
  Administered 2015-01-20: 1 mL via EPIDURAL

## 2015-01-20 MED ORDER — METHYLPREDNISOLONE ACETATE 40 MG/ML INJ SUSP (RADIOLOG
120.0000 mg | Freq: Once | INTRAMUSCULAR | Status: AC
Start: 2015-01-20 — End: 2015-01-20
  Administered 2015-01-20: 120 mg via EPIDURAL

## 2015-01-20 NOTE — Discharge Instructions (Signed)

## 2015-01-27 ENCOUNTER — Emergency Department (HOSPITAL_COMMUNITY): Payer: No Typology Code available for payment source

## 2015-01-27 ENCOUNTER — Emergency Department (HOSPITAL_COMMUNITY)
Admission: EM | Admit: 2015-01-27 | Discharge: 2015-01-27 | Disposition: A | Discharge status: Home / Self Care | Payer: No Typology Code available for payment source | Attending: Emergency Medicine | Admitting: Emergency Medicine

## 2015-01-27 ENCOUNTER — Encounter (HOSPITAL_COMMUNITY): Payer: Self-pay | Admitting: Emergency Medicine

## 2015-01-27 DIAGNOSIS — W19XXXA Unspecified fall, initial encounter: Secondary | ICD-10-CM

## 2015-01-27 DIAGNOSIS — G43909 Migraine, unspecified, not intractable, without status migrainosus: Secondary | ICD-10-CM | POA: Insufficient documentation

## 2015-01-27 DIAGNOSIS — Z79899 Other long term (current) drug therapy: Secondary | ICD-10-CM | POA: Insufficient documentation

## 2015-01-27 DIAGNOSIS — F329 Major depressive disorder, single episode, unspecified: Secondary | ICD-10-CM | POA: Insufficient documentation

## 2015-01-27 DIAGNOSIS — Z791 Long term (current) use of non-steroidal anti-inflammatories (NSAID): Secondary | ICD-10-CM | POA: Insufficient documentation

## 2015-01-27 DIAGNOSIS — Y990 Civilian activity done for income or pay: Secondary | ICD-10-CM | POA: Insufficient documentation

## 2015-01-27 DIAGNOSIS — Y9289 Other specified places as the place of occurrence of the external cause: Secondary | ICD-10-CM | POA: Insufficient documentation

## 2015-01-27 DIAGNOSIS — W07XXXA Fall from chair, initial encounter: Secondary | ICD-10-CM | POA: Insufficient documentation

## 2015-01-27 DIAGNOSIS — F419 Anxiety disorder, unspecified: Secondary | ICD-10-CM | POA: Insufficient documentation

## 2015-01-27 DIAGNOSIS — S5001XA Contusion of right elbow, initial encounter: Secondary | ICD-10-CM | POA: Insufficient documentation

## 2015-01-27 DIAGNOSIS — I10 Essential (primary) hypertension: Secondary | ICD-10-CM | POA: Insufficient documentation

## 2015-01-27 DIAGNOSIS — G8929 Other chronic pain: Secondary | ICD-10-CM | POA: Insufficient documentation

## 2015-01-27 DIAGNOSIS — Y9389 Activity, other specified: Secondary | ICD-10-CM | POA: Insufficient documentation

## 2015-01-27 MED ORDER — CYCLOBENZAPRINE HCL 5 MG PO TABS: 5.0000 mg | ORAL_TABLET | Freq: Two times a day (BID) | ORAL | Status: DC | PRN

## 2015-01-27 MED ORDER — IBUPROFEN 400 MG PO TABS
800.0000 mg | ORAL_TABLET | Freq: Once | ORAL | Status: AC
  Administered 2015-01-27: 800 mg via ORAL
  Filled 2015-01-27: qty 2

## 2015-01-27 MED ORDER — CYCLOBENZAPRINE HCL 10 MG PO TABS
5.0000 mg | ORAL_TABLET | Freq: Once | ORAL | Status: AC
  Administered 2015-01-27: 5 mg via ORAL
  Filled 2015-01-27: qty 1

## 2015-01-27 MED ORDER — NAPROXEN 500 MG PO TABS: 500.0000 mg | ORAL_TABLET | Freq: Two times a day (BID) | ORAL | Status: DC

## 2015-01-27 NOTE — ED Notes (Signed)
Pt denies rib/chest pain per radiology and rib xray cancelled

## 2015-01-27 NOTE — Discharge Instructions (Signed)
Contusin en el codo  (Elbow Contusion) Una contusin en el codo es un hematoma profundo en el mismo. Las contusiones son el resultado de una lesin que causa sangrado debajo de la piel. La zona de la contusin puede ponerse [place], [place] o [place]. Las lesiones menores no causan Social research officer, government, Armed forces training and education officer las ms graves pueden presentar dolor e inflamacin durante un par de semanas.  CAUSAS  Una contusin en el codo es consecuencia de una fuerza directa en la zona, por ejemplo una cada sobre el codo.  SNTOMAS   Hinchazn y enrojecimiento en el codo.  Hematoma en la zona.  Sensibilidad o dolor. DIAGNSTICO  Le harn un examen fsico y le preguntarn acerca de su historia. Puede ser necesario que le tomen una radiografa del codo para diagnosticar si hay algn hueso roto (fractura).  TRATAMIENTO  Podrn colocarle un cabestrillo o frula para soportar la lesin. Generalmente el mejor tratamiento es el reposo, la elevacin del codo y la aplicacin de compresas fras en la zona. Para controlar el dolor tambin podrn recomendarle medicamentos de [place].  INSTRUCCIONES PARA EL CUIDADO EN EL HOGAR   Aplique hielo sobre la zona lesionada.  Ponga el hielo en una bolsa plstica.  Colquese una toalla entre la piel y la bolsa de hielo.  Deje el hielo durante 15 a 20 minutos, 3 a 4 veces por da.  Slo tome medicamentos de venta libre o recetados para Glass blower/designer, las molestias o Sports coach la fiebre segn las indicaciones de su mdico.  Quarry manager en reposo el codo lesionado hasta que el dolor y la hinchazn mejoren.  Eleve el codo para reducir la hinchazn.  Aplique un vendaje de compresin segn le haya indicado su mdico. Esto ayuda a reducir la inflamacin y Neurosurgeon. Puede retirar el vendaje para dormir, darse Collene Mares y baarse. Si los dedos estn adormecidos, fros o de [employer], retire el vendaje y vuelva a colocarlo de manera ms floja.  Utilice la crema slo en la forma  indicada por el mdico. Le indicarn que se haga ejercicios de amplitud de movimientos. Hgalos como se le indic.  Consulte a su mdico como se le indique. Es muy importante concurrir a las visitas de seguimiento para Building surveyor en el codo a Barrister's clerk, p.ej. dolor crnico o incapacidad para moverlo normalmente. SOLICITE ATENCIN MDICA DE INMEDIATO SI:   Observa un aumento del enrojecimiento, hinchazn o dolor en el codo.  La hinchazn o el dolor no se [employer].  Tiene las manos o los dedos hinchados.  No puede mover los dedos o la [place].  Pierde la sensibilidad en la mano o en los dedos.  Sus dedos o la mano estn fros o de Optician, dispensing. ASEGRESE DE QUE:   Comprende estas instrucciones.  Controlar su enfermedad.  Solicitar ayuda de inmediato si no mejora o si empeora. Document Released: 09/03/2008 Document Revised: 08/30/2011 [facility] Patient Information 2015 [place]. This information is not intended to replace advice given to you by your health care provider. Make sure you discuss any questions you have with your health care provider.  Derrame Articular Del Codo (Elbow Effusion) Usted ha sufrido una lesin en el codo con derrame. Esto significa que hay sangre u otro fluido en la articulacin. Tanto las fracturas [employer] esguinces de codo pueden provocar un derrame con inflamacin y Social research officer, government. Los rayos-X a menudo muestran esta inflamacin alrededor del codo, pero podran no Optician, dispensing. El tratamiento para esguinces de  codo y fracturas menores es reducir la inflamacin y Conservation officer, historic buildings. Hacer reposo de la articulacin hasta que no haya dolor en los movimientos. La repeticin del estudio de rayos-x luego de 1 a 2 semanas podra mostrar una fractura menor del radio que no poda visualizarse en la radiografa inicial. Muchas veces se utiliza un entablillado o cabestrillo durante los primeros das o semanas luego de la lesin. Aplique hielo en la zona  lesionada durante 20 a 30 minutos cada 2  3 horas durante los siguientes 2  3 das. Mantenga el codo elevado sobre el nivel del corazn todo el tiempo que le sea posible hasta que el dolor y la inflamacin mejoren. Tambin podr [employer] un vendaje elstico para ayudar a Forensic psychologist. Realice un seguimiento con el mdico dentro de [employer].  El principal problema en este caso es la prdida del movimiento del codo. En general, el mdico comenzar indicando ejercicios de movimiento y lo derivar a un fisioterapeuta o kinesilogo. SOLICITE ATENCIN MDICA SI:  La mano o el antebrazo se vuelven entumecidos, fros o plidos. Document Released: 06/07/2005 Document Revised: 08/30/2011 [facility] Patient Information 2015 [place]. This information is not intended to replace advice given to you by your health care provider. Make sure you discuss any questions you have with your health care provider.
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
## 2015-01-27 NOTE — ED Notes (Signed)
Pt c/o right elbow pain with swelling and some pain in right side of chest after falling against chair today

## 2015-02-04 ENCOUNTER — Telehealth: Payer: Self-pay | Admitting: Internal Medicine

## 2015-02-04 NOTE — Telephone Encounter (Signed)
Pt requesting the nurse to call back.

## 2015-02-04 NOTE — Telephone Encounter (Signed)
Return call to pt - not available. Left  Message clinic called.

## 2015-02-05 NOTE — Telephone Encounter (Signed)
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

## 2015-02-10 NOTE — Telephone Encounter (Signed)
Another call to pt.  Left message for her to return my call.

## 2015-02-11 NOTE — Telephone Encounter (Signed)
Another call to pt, will close this request

## 2015-02-21 ENCOUNTER — Ambulatory Visit: Payer: No Typology Code available for payment source

## 2015-03-10 ENCOUNTER — Ambulatory Visit: Payer: No Typology Code available for payment source

## 2015-03-11 ENCOUNTER — Ambulatory Visit: Payer: No Typology Code available for payment source

## 2015-04-29 ENCOUNTER — Emergency Department (HOSPITAL_COMMUNITY)
Admission: EM | Admit: 2015-04-29 | Discharge: 2015-04-29 | Disposition: A | Discharge status: Home / Self Care | Payer: No Typology Code available for payment source | Attending: Emergency Medicine | Admitting: Emergency Medicine

## 2015-04-29 ENCOUNTER — Encounter (HOSPITAL_COMMUNITY): Payer: Self-pay | Admitting: Emergency Medicine

## 2015-04-29 DIAGNOSIS — R5383 Other fatigue: Secondary | ICD-10-CM | POA: Insufficient documentation

## 2015-04-29 DIAGNOSIS — B9689 Other specified bacterial agents as the cause of diseases classified elsewhere: Secondary | ICD-10-CM

## 2015-04-29 DIAGNOSIS — R112 Nausea with vomiting, unspecified: Secondary | ICD-10-CM | POA: Insufficient documentation

## 2015-04-29 DIAGNOSIS — R0981 Nasal congestion: Secondary | ICD-10-CM | POA: Insufficient documentation

## 2015-04-29 DIAGNOSIS — R05 Cough: Secondary | ICD-10-CM | POA: Insufficient documentation

## 2015-04-29 DIAGNOSIS — G8929 Other chronic pain: Secondary | ICD-10-CM | POA: Insufficient documentation

## 2015-04-29 DIAGNOSIS — N760 Acute vaginitis: Secondary | ICD-10-CM | POA: Insufficient documentation

## 2015-04-29 DIAGNOSIS — Z8719 Personal history of other diseases of the digestive system: Secondary | ICD-10-CM | POA: Insufficient documentation

## 2015-04-29 DIAGNOSIS — Z79899 Other long term (current) drug therapy: Secondary | ICD-10-CM | POA: Insufficient documentation

## 2015-04-29 DIAGNOSIS — R1084 Generalized abdominal pain: Secondary | ICD-10-CM

## 2015-04-29 DIAGNOSIS — F419 Anxiety disorder, unspecified: Secondary | ICD-10-CM | POA: Insufficient documentation

## 2015-04-29 DIAGNOSIS — I10 Essential (primary) hypertension: Secondary | ICD-10-CM | POA: Insufficient documentation

## 2015-04-29 DIAGNOSIS — R197 Diarrhea, unspecified: Secondary | ICD-10-CM | POA: Insufficient documentation

## 2015-04-29 DIAGNOSIS — R509 Fever, unspecified: Secondary | ICD-10-CM | POA: Insufficient documentation

## 2015-04-29 DIAGNOSIS — G43909 Migraine, unspecified, not intractable, without status migrainosus: Secondary | ICD-10-CM | POA: Insufficient documentation

## 2015-04-29 LAB — URINALYSIS, ROUTINE W REFLEX MICROSCOPIC
BILIRUBIN URINE: NEGATIVE
Glucose, UA: NEGATIVE mg/dL
HGB URINE DIPSTICK: NEGATIVE
Ketones, ur: NEGATIVE mg/dL
Leukocytes, UA: NEGATIVE
Nitrite: NEGATIVE
PH: 8 (ref 5.0–8.0)
Protein, ur: NEGATIVE mg/dL
SPECIFIC GRAVITY, URINE: 1.022 (ref 1.005–1.030)
Urobilinogen, UA: 1 mg/dL (ref 0.0–1.0)

## 2015-04-29 LAB — CBC WITH DIFFERENTIAL/PLATELET
BASOS PCT: 0 %
Basophils Absolute: 0 10*3/uL (ref 0.0–0.1)
Eosinophils Absolute: 0 10*3/uL (ref 0.0–0.7)
Eosinophils Relative: 0 %
HEMATOCRIT: 41.9 % (ref 36.0–46.0)
HEMOGLOBIN: 13.6 g/dL (ref 12.0–15.0)
LYMPHS PCT: 3 %
Lymphs Abs: 0.4 10*3/uL — ABNORMAL LOW (ref 0.7–4.0)
MCH: 30.4 pg (ref 26.0–34.0)
MCHC: 32.5 g/dL (ref 30.0–36.0)
MCV: 93.5 fL (ref 78.0–100.0)
MONOS PCT: 2 %
Monocytes Absolute: 0.3 10*3/uL (ref 0.1–1.0)
NEUTROS ABS: 13.3 10*3/uL — AB (ref 1.7–7.7)
NEUTROS PCT: 95 %
Platelets: 326 10*3/uL (ref 150–400)
RBC: 4.48 MIL/uL (ref 3.87–5.11)
RDW: 12.9 % (ref 11.5–15.5)
WBC: 14 10*3/uL — ABNORMAL HIGH (ref 4.0–10.5)

## 2015-04-29 LAB — COMPREHENSIVE METABOLIC PANEL
ALBUMIN: 4.9 g/dL (ref 3.5–5.0)
ALK PHOS: 70 U/L (ref 38–126)
ALT: 22 U/L (ref 14–54)
ANION GAP: 9 (ref 5–15)
AST: 25 U/L (ref 15–41)
BILIRUBIN TOTAL: 0.9 mg/dL (ref 0.3–1.2)
BUN: 14 mg/dL (ref 6–20)
CALCIUM: 9.4 mg/dL (ref 8.9–10.3)
CO2: 24 mmol/L (ref 22–32)
Chloride: 104 mmol/L (ref 101–111)
Creatinine, Ser: 0.78 mg/dL (ref 0.44–1.00)
GFR calc Af Amer: >60 mL/min (ref >60)
GFR calc non Af Amer: >60 mL/min (ref >60)
GLUCOSE: 108 mg/dL — AB (ref 65–99)
Potassium: 3.5 mmol/L (ref 3.5–5.1)
Sodium: 137 mmol/L (ref 135–145)
TOTAL PROTEIN: 8.4 g/dL — AB (ref 6.5–8.1)

## 2015-04-29 LAB — WET PREP, GENITAL
Trich, Wet Prep: NONE SEEN
Yeast Wet Prep HPF POC: NONE SEEN

## 2015-04-29 LAB — LIPASE, BLOOD: Lipase: 37 U/L (ref 11–51)

## 2015-04-29 MED ORDER — METRONIDAZOLE 500 MG PO TABS: 500.0000 mg | ORAL_TABLET | Freq: Two times a day (BID) | ORAL | Status: DC

## 2015-04-29 MED ORDER — ONDANSETRON HCL 4 MG/2ML IJ SOLN
4.0000 mg | Freq: Once | INTRAMUSCULAR | Status: AC
  Administered 2015-04-29: 4 mg via INTRAVENOUS
  Filled 2015-04-29: qty 2

## 2015-04-29 MED ORDER — ONDANSETRON 4 MG PO TBDP
4.0000 mg | ORAL_TABLET | Freq: Three times a day (TID) | ORAL | Status: DC | PRN
Start: 2015-04-29 — End: 2015-09-24

## 2015-04-29 MED ORDER — MORPHINE SULFATE (PF) 2 MG/ML IV SOLN
2.0000 mg | Freq: Once | INTRAVENOUS | Status: AC
  Administered 2015-04-29: 2 mg via INTRAVENOUS
  Filled 2015-04-29: qty 1

## 2015-04-29 MED ORDER — SODIUM CHLORIDE 0.9 % IV BOLUS (SEPSIS)
1000.0000 mL | Freq: Once | INTRAVENOUS | Status: AC
  Administered 2015-04-29: 1000 mL via INTRAVENOUS

## 2015-04-29 MED ORDER — GI COCKTAIL ~~LOC~~
30.0000 mL | Freq: Once | ORAL | Status: AC
  Administered 2015-04-29: 30 mL via ORAL
  Filled 2015-04-29: qty 30
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
## 2015-04-29 NOTE — ED Notes (Signed)
Per EMS: Pt from home.  C/o subjective fever, chills, cough, body aches, NVD x 2 days.

## 2015-04-29 NOTE — ED Notes (Signed)
Bed: SQ58 Expected date:  Expected time:  Means of arrival:  Comments: flu

## 2015-04-29 NOTE — ED Notes (Signed)
Pt given ginger ale and saltine crackers- no vomiting noted.

## 2015-04-30 ENCOUNTER — Other Ambulatory Visit: Payer: Self-pay | Admitting: Internal Medicine

## 2015-04-30 LAB — GC/CHLAMYDIA PROBE AMP (~~LOC~~) NOT AT ARMC
CHLAMYDIA, DNA PROBE: POSITIVE — AB
Neisseria Gonorrhea: NEGATIVE

## 2015-04-30 NOTE — Telephone Encounter (Signed)
Last refilled by Dr. Heber Sheridan for back pain. However seems like she should be getting this from [place] who manages her anxiety. I refilled her 5 tabs until she can get in with [facility]. Please let me know if there are any other questions or concerns regarding her klonopin. Thank you!  Julious Oka, MD Internal Medicine Resident, PGY [facility] Pager: [phone]

## 2015-04-30 NOTE — Telephone Encounter (Signed)
Pt cant get in to see [facility] until December, also states that she has always gotten refills from [facility], wants to know if you would be willing to fill until end of December, please advise.[place], Jeremie Abdelaziz Cassady11/9/20164:43 PM    [employer] [phone] Access code: [phone]

## 2015-05-01 ENCOUNTER — Telehealth (HOSPITAL_COMMUNITY): Payer: Self-pay

## 2015-05-01 NOTE — Telephone Encounter (Signed)
Positive for chlamydia. Chart sent to [place] office for review.

## 2015-05-02 ENCOUNTER — Other Ambulatory Visit: Payer: Self-pay | Admitting: Internal Medicine

## 2015-05-03 ENCOUNTER — Telehealth (HOSPITAL_BASED_OUTPATIENT_CLINIC_OR_DEPARTMENT_OTHER): Payer: Self-pay | Admitting: Emergency Medicine

## 2015-05-03 NOTE — Telephone Encounter (Incomplete)
Post ED Visit - Positive Culture Follow-up: Successful Patient Follow-Up  Culture assessed and recommendations reviewed by: '[]'$  Elenor Quinones, Pharm.D. '[]'$  Heide Guile, Pharm.D., BCPS '[]'$  Parks Neptune, Pharm.D. '[]'$  Alycia Rossetti, Pharm.D., BCPS '[]'$  [place], Pharm.D., BCPS, AAHIVP '[]'$  Legrand Como, Pharm.D., BCPS, AAHIVP '[]'$  Milus Glazier, Pharm.D. '[x]'$  Stephens November, Pharm.D.  Positive chllamydia culture  '[x]'$  Patient discharged without antimicrobial prescription and treatment is now indicated '[]'$  Organism is resistant to prescribed ED discharge antimicrobial '[]'$  Patient with positive blood cultures  Changes discussed with ED provider: [place] antibiotic prescription Azithromycin 1 gram po followed by doxycycline '100mg'$  po bid x 14 days Called to [place] patient, 05/03/15 1122   Hazle Nordmann 05/03/2015, 5:11 PM

## 2015-05-06 ENCOUNTER — Encounter: Payer: Self-pay | Admitting: Internal Medicine

## 2015-05-06 ENCOUNTER — Ambulatory Visit (INDEPENDENT_AMBULATORY_CARE_PROVIDER_SITE_OTHER): Payer: No Typology Code available for payment source | Admitting: Internal Medicine

## 2015-05-06 VITALS — BP 110/62 | HR 55 | Temp 98.2°F | Wt 112.2 lb

## 2015-05-06 DIAGNOSIS — F419 Anxiety disorder, unspecified: Secondary | ICD-10-CM

## 2015-05-06 DIAGNOSIS — Z0000 Encounter for general adult medical examination without abnormal findings: Secondary | ICD-10-CM

## 2015-05-06 DIAGNOSIS — G894 Chronic pain syndrome: Secondary | ICD-10-CM

## 2015-05-06 DIAGNOSIS — M5441 Lumbago with sciatica, right side: Secondary | ICD-10-CM

## 2015-05-06 DIAGNOSIS — G4700 Insomnia, unspecified: Secondary | ICD-10-CM

## 2015-05-06 DIAGNOSIS — Z23 Encounter for immunization: Secondary | ICD-10-CM

## 2015-05-06 DIAGNOSIS — N898 Other specified noninflammatory disorders of vagina: Secondary | ICD-10-CM

## 2015-05-06 MED ORDER — AZITHROMYCIN 250 MG PO TABS: 500.0000 mg | ORAL_TABLET | Freq: Once | ORAL | Status: DC

## 2015-05-06 MED ORDER — AZITHROMYCIN 500 MG PO TABS
1000.0000 mg | ORAL_TABLET | Freq: Once | ORAL | Status: AC
  Administered 2015-05-06: 1000 mg via ORAL

## 2015-05-06 MED ORDER — AZITHROMYCIN 250 MG PO TABS: 1000.0000 mg | ORAL_TABLET | Freq: Once | ORAL | Status: DC

## 2015-05-06 MED ORDER — ZOLPIDEM TARTRATE 5 MG PO TABS: 5.0000 mg | ORAL_TABLET | Freq: Every evening | ORAL | Status: DC | PRN
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
## 2015-05-06 NOTE — Telephone Encounter (Signed)
Called to pharm 

## 2015-05-06 NOTE — Assessment & Plan Note (Addendum)
Patient reports that she is on ambien 5 mg qhs for insomnia. She reports today that she requires the ambien 2-3 times a week for sleep. She states that she has difficulty falling asleep (>30 minutes to initiate sleep). Reports waking up 1-2 times a night with no trouble falling back asleep. She works as a Secretary/administrator at [employer] and says that her hours change on a weekly basis and she is unable to maintain a regular sleep cycle.  Discussed sleep hygiene today. Will refill Ambien Rx today with follow up with PCP for further management.

## 2015-05-06 NOTE — Assessment & Plan Note (Signed)
Patient with long term use of benzodiazepines for panic attacks asking for refills today. She reports that she has been on Klonapin for several years for anxiety/panic attacks. She tells me to today that she has 4-5 panic attacks a week if she is does not take the klonapin and has not had any further attacks since she started taking it daily. She is currently on Klonapin 1 mg daily. There are no notes in our system since 2013 so it is unclear who has been prescribing this for her.   Prescription was filled by her PCP yesterday (phoned in). Will make sure the prescription was received by the pharmacy. Follow up with PCP at next available visit to further discussion of her panic attacks.

## 2015-05-06 NOTE — Assessment & Plan Note (Addendum)
Patient here today with redcurrant right lower back pain with right sided sciatica. She had an MRI done in 08/2014 that showed multilevel herniations and stenosis.  She has tried multiple therapies in the past but reports the only thing that has helped has been steroid injections. Has been referred to ortho and neurosurgery in the past but has deferred until now due to her schedule. Reports today she is able to have surgery scheduled.  Will refer her to ortho surgery today Will place referral to epidural steroid injection (last injection in August)

## 2015-05-06 NOTE — Assessment & Plan Note (Signed)
Patient received flu shot today

## 2015-05-06 NOTE — Assessment & Plan Note (Addendum)
Patient never filled script for azithromycin. Was seen in the ED on 11/8 with complaints of vaginal discharge. She was found to have BV and was again positive for Chlamydia and negative for Gonorrhea. She was given a script for Flagyl and Azithromycin. States today that she never had the scripts filled due to lack of money. Denies any allergy to azithromycin.  Gave her 1 g Azithromycin today in clinic. Discussed the need to fill the Flagyl script for the BV infection. Denies any vaginal discharge today. Discussed the need for safe sex practices as well as having her partner treated.

## 2015-05-06 NOTE — Patient Instructions (Signed)
Thank you for coming in today  -I am giving you a referral to a surgeon for your back pain. We will call you when the appointment is set up. -I have sent refills in on your medications -We are treating you for a vaginal infection today. It is important that your partner gets treated as well.  -Please follow up with Dr. Aurelio Brash at her first available appointment slot

## 2015-05-07 ENCOUNTER — Telehealth: Payer: Self-pay | Admitting: *Deleted

## 2015-05-07 ENCOUNTER — Encounter: Payer: Self-pay | Admitting: Licensed Clinical Social Worker

## 2015-05-07 NOTE — Telephone Encounter (Signed)
Message left on recorder for [facility]-pt needs to be scheduled for epidurography.  Will wait for return call.Regenia Skeeter, Elmore Hyslop Cassady11/16/201612:00 PM

## 2015-05-07 NOTE — Progress Notes (Signed)
CSW met with Karen [name] following her scheduled [facility] appointment, utilizing internet interpreting service.  Pt inquiring about community resources regarding transportation.  Karen [name] informed this worker of a friend that has a "green" card that provides transportation to/from work for free.  Pt states friend received this card from [facility].  Pt aware [facility] closed for lunch at this time, CSW will f/u and return call to Karen [name].   Nursing staff informed CSW pt may have an appointment [place] out of county in the near future and transportation resources are limited for patient.  CSW informed Karen [name] about current transportation resources provided by the [employer] card, pt was unaware by had previously completed application.  CSW provided Karen [name] information in Spanish to arrange transportation utilizing her [employer] card.  In addition, CSW provided Karen [name] with information on [facility] with the Center for [employer].  Pt given Spanish brochure and agreeable to referral.  CSW faxed updated [facility] transportation form and [facility] referral form today, both faxes confirmed.  Pt requesting pantry assistance, CSW provided Karen [name] with food from [facility] food pantry.   Follow up call regarding Green transportation card, [facility] does not provide this card.  CSW placed call to Karen [name] to notify, pt states [place] provided card to friend.  CSW provided Karen [name] with the phone number to this agency as pt most likely will need to be linked with their programming.  Pt denies add'l needs at this time.

## 2015-05-14 NOTE — Progress Notes (Signed)
Internal Medicine Clinic Attending  I saw and evaluated the patient.  I personally confirmed the key portions of the history and exam documented by Dr. Boswell and I reviewed pertinent patient test results.  The assessment, diagnosis, and plan were formulated together and I agree with the documentation in the resident's note. 

## 2015-06-06 ENCOUNTER — Other Ambulatory Visit: Payer: Self-pay | Admitting: Internal Medicine

## 2015-06-06 ENCOUNTER — Ambulatory Visit
Admission: RE | Admit: 2015-06-06 | Discharge: 2015-06-06 | Disposition: A | Discharge status: Home / Self Care | Payer: No Typology Code available for payment source | Source: Ambulatory Visit | Attending: Internal Medicine | Admitting: Internal Medicine

## 2015-06-06 DIAGNOSIS — G894 Chronic pain syndrome: Secondary | ICD-10-CM

## 2015-06-06 MED ORDER — METHYLPREDNISOLONE ACETATE 40 MG/ML INJ SUSP (RADIOLOG
120.0000 mg | Freq: Once | INTRAMUSCULAR | Status: AC
  Administered 2015-06-06: 120 mg via EPIDURAL

## 2015-06-06 MED ORDER — IOHEXOL 180 MG/ML  SOLN
1.0000 mL | Freq: Once | INTRAMUSCULAR | Status: AC | PRN
  Administered 2015-06-06: 1 mL via EPIDURAL

## 2015-06-06 NOTE — Discharge Instructions (Signed)

## 2015-07-01 NOTE — Telephone Encounter (Signed)
Procedure done 06/06/2015.Karen [name], Karen Sullenger Cassady1/10/201712:06 PM

## 2015-07-03 MED FILL — clonazePAM 1 MG TABS: 1 | 30 days supply | Qty: 30 | Fill #2

## 2015-07-31 MED FILL — clonazePAM 1 MG TABS: 1 | 30 days supply | Qty: 30 | Fill #3

## 2015-08-04 ENCOUNTER — Other Ambulatory Visit: Payer: Self-pay | Admitting: *Deleted

## 2015-08-04 DIAGNOSIS — G4700 Insomnia, unspecified: Secondary | ICD-10-CM

## 2015-08-04 MED ORDER — ZOLPIDEM TARTRATE 5 MG PO TABS: 5.0000 mg | ORAL_TABLET | Freq: Every evening | ORAL | Status: DC | PRN

## 2015-08-25 ENCOUNTER — Other Ambulatory Visit: Payer: Self-pay | Admitting: Internal Medicine

## 2015-08-25 MED FILL — ZOLPIDEM TARTRATE 5 MG TAB: 5 | 30 days supply | Qty: 30 | Fill #0

## 2015-08-25 NOTE — Telephone Encounter (Signed)
Phoned to pharmacy- have contact front office to schedule pt

## 2015-08-28 MED FILL — clonazePAM 1 MG TABS: 1 | 30 days supply | Qty: 30 | Fill #0

## 2015-09-08 ENCOUNTER — Ambulatory Visit: Payer: No Typology Code available for payment source

## 2015-09-16 ENCOUNTER — Telehealth: Payer: Self-pay | Admitting: Internal Medicine

## 2015-09-16 NOTE — Telephone Encounter (Signed)
APPT. REMINDER CALL, LMTCB °

## 2015-09-17 ENCOUNTER — Encounter: Payer: No Typology Code available for payment source | Admitting: Internal Medicine

## 2015-09-18 ENCOUNTER — Encounter: Payer: Self-pay | Admitting: Internal Medicine

## 2015-09-23 ENCOUNTER — Ambulatory Visit: Payer: No Typology Code available for payment source

## 2015-09-23 ENCOUNTER — Emergency Department (HOSPITAL_COMMUNITY)
Admission: EM | Admit: 2015-09-23 | Discharge: 2015-09-24 | Disposition: A | Discharge status: Home / Self Care | Payer: No Typology Code available for payment source | Attending: Emergency Medicine | Admitting: Emergency Medicine

## 2015-09-23 ENCOUNTER — Encounter (HOSPITAL_COMMUNITY): Payer: Self-pay | Admitting: Emergency Medicine

## 2015-09-23 DIAGNOSIS — N898 Other specified noninflammatory disorders of vagina: Secondary | ICD-10-CM

## 2015-09-23 DIAGNOSIS — I10 Essential (primary) hypertension: Secondary | ICD-10-CM | POA: Insufficient documentation

## 2015-09-23 DIAGNOSIS — R61 Generalized hyperhidrosis: Secondary | ICD-10-CM | POA: Insufficient documentation

## 2015-09-23 DIAGNOSIS — F329 Major depressive disorder, single episode, unspecified: Secondary | ICD-10-CM | POA: Insufficient documentation

## 2015-09-23 DIAGNOSIS — R1110 Vomiting, unspecified: Secondary | ICD-10-CM

## 2015-09-23 DIAGNOSIS — N760 Acute vaginitis: Secondary | ICD-10-CM | POA: Insufficient documentation

## 2015-09-23 DIAGNOSIS — Z79899 Other long term (current) drug therapy: Secondary | ICD-10-CM | POA: Insufficient documentation

## 2015-09-23 DIAGNOSIS — B9689 Other specified bacterial agents as the cause of diseases classified elsewhere: Secondary | ICD-10-CM

## 2015-09-23 DIAGNOSIS — Z8719 Personal history of other diseases of the digestive system: Secondary | ICD-10-CM | POA: Insufficient documentation

## 2015-09-23 DIAGNOSIS — G43909 Migraine, unspecified, not intractable, without status migrainosus: Secondary | ICD-10-CM | POA: Insufficient documentation

## 2015-09-23 DIAGNOSIS — R112 Nausea with vomiting, unspecified: Secondary | ICD-10-CM | POA: Insufficient documentation

## 2015-09-23 DIAGNOSIS — R197 Diarrhea, unspecified: Secondary | ICD-10-CM | POA: Insufficient documentation

## 2015-09-23 DIAGNOSIS — G8929 Other chronic pain: Secondary | ICD-10-CM | POA: Insufficient documentation

## 2015-09-23 DIAGNOSIS — F419 Anxiety disorder, unspecified: Secondary | ICD-10-CM | POA: Insufficient documentation

## 2015-09-23 LAB — COMPREHENSIVE METABOLIC PANEL
ALT: 33 U/L (ref 14–54)
AST: 30 U/L (ref 15–41)
Albumin: 4.5 g/dL (ref 3.5–5.0)
Alkaline Phosphatase: 83 U/L (ref 38–126)
Anion gap: 10 (ref 5–15)
BILIRUBIN TOTAL: 0.3 mg/dL (ref 0.3–1.2)
BUN: 23 mg/dL — AB (ref 6–20)
CHLORIDE: 108 mmol/L (ref 101–111)
CO2: 24 mmol/L (ref 22–32)
CREATININE: 0.85 mg/dL (ref 0.44–1.00)
Calcium: 9.5 mg/dL (ref 8.9–10.3)
GFR calc Af Amer: >60 mL/min (ref >60)
Glucose, Bld: 101 mg/dL — ABNORMAL HIGH (ref 65–99)
Potassium: 3.5 mmol/L (ref 3.5–5.1)
Sodium: 142 mmol/L (ref 135–145)
TOTAL PROTEIN: 7.6 g/dL (ref 6.5–8.1)

## 2015-09-23 LAB — CBC
HCT: 37.3 % (ref 36.0–46.0)
Hemoglobin: 12.7 g/dL (ref 12.0–15.0)
MCH: 29.7 pg (ref 26.0–34.0)
MCHC: 34 g/dL (ref 30.0–36.0)
MCV: 87.4 fL (ref 78.0–100.0)
PLATELETS: 311 10*3/uL (ref 150–400)
RBC: 4.27 MIL/uL (ref 3.87–5.11)
RDW: 13.3 % (ref 11.5–15.5)
WBC: 13.7 10*3/uL — AB (ref 4.0–10.5)

## 2015-09-23 LAB — LIPASE, BLOOD: Lipase: 47 U/L (ref 11–51)

## 2015-09-23 MED ORDER — KETOROLAC TROMETHAMINE 30 MG/ML IJ SOLN
30.0000 mg | Freq: Once | INTRAMUSCULAR | Status: AC
  Administered 2015-09-23: 30 mg via INTRAVENOUS
  Filled 2015-09-23: qty 1

## 2015-09-23 MED ORDER — METOCLOPRAMIDE HCL 5 MG/ML IJ SOLN
10.0000 mg | Freq: Once | INTRAMUSCULAR | Status: AC
  Administered 2015-09-23: 10 mg via INTRAVENOUS
  Filled 2015-09-23: qty 2

## 2015-09-23 MED ORDER — HYDROMORPHONE HCL 1 MG/ML IJ SOLN
1.0000 mg | Freq: Once | INTRAMUSCULAR | Status: AC
  Administered 2015-09-23: 1 mg via INTRAVENOUS
  Filled 2015-09-23: qty 1

## 2015-09-23 MED ORDER — SODIUM CHLORIDE 0.9 % IV BOLUS (SEPSIS)
1000.0000 mL | Freq: Once | INTRAVENOUS | Status: AC
  Administered 2015-09-23: 1000 mL via INTRAVENOUS

## 2015-09-23 MED ORDER — ONDANSETRON HCL 4 MG/2ML IJ SOLN
4.0000 mg | Freq: Once | INTRAMUSCULAR | Status: AC
  Administered 2015-09-23: 4 mg via INTRAVENOUS
  Filled 2015-09-23: qty 2

## 2015-09-23 MED ORDER — SODIUM CHLORIDE 0.9 % IV BOLUS (SEPSIS): 1000.0000 mL | Freq: Once | INTRAVENOUS | Status: DC

## 2015-09-23 NOTE — ED Notes (Signed)
Bed: VU34 Expected date:  Expected time:  Means of arrival:  Comments: EMS 50yo F abd pain / N/V

## 2015-09-23 NOTE — ED Notes (Signed)
Patient presents from home via EMS for N/V/D x1 week, 10/10 epigastric pain, diaphoretic.

## 2015-09-24 LAB — WET PREP, GENITAL
Clue Cells Wet Prep HPF POC: NONE SEEN
SPERM: NONE SEEN
TRICH WET PREP: NONE SEEN
YEAST WET PREP: NONE SEEN

## 2015-09-24 LAB — URINALYSIS, ROUTINE W REFLEX MICROSCOPIC
Bilirubin Urine: NEGATIVE
GLUCOSE, UA: NEGATIVE mg/dL
HGB URINE DIPSTICK: NEGATIVE
Ketones, ur: NEGATIVE mg/dL
Leukocytes, UA: NEGATIVE
Nitrite: NEGATIVE
PROTEIN: NEGATIVE mg/dL
Specific Gravity, Urine: 1.028 (ref 1.005–1.030)
pH: 6 (ref 5.0–8.0)

## 2015-09-24 LAB — GC/CHLAMYDIA PROBE AMP (~~LOC~~) NOT AT ARMC
Chlamydia: NEGATIVE
Neisseria Gonorrhea: NEGATIVE

## 2015-09-24 MED ORDER — ONDANSETRON 4 MG PO TBDP: 4.0000 mg | ORAL_TABLET | Freq: Three times a day (TID) | ORAL | Status: DC | PRN

## 2015-09-24 MED ORDER — METRONIDAZOLE 500 MG PO TABS: 500.0000 mg | ORAL_TABLET | Freq: Two times a day (BID) | ORAL | Status: DC

## 2015-09-24 NOTE — Discharge Instructions (Signed)
Vaginosis bacteriana (Bacterial Vaginosis) La vaginosis bacteriana es una infeccin de la vagina. Se produce cuando una cantidad excesiva de ciertos grmenes (bacterias) crece en la vagina. Esta infeccin aumenta el riesgo de contraer otras infecciones de transmisin sexual. El tratamiento de esta infeccin puede ayudar a reducir el riesgo de otras infecciones, como:   Clamidia.  Roderick Pee.  VIH.  Herpes. [place] los medicamentos tal como se lo indic su mdico.  Finalice la prescripcin completa, aunque comience a sentirse mejor.  Comunique a sus compaeros sexuales que sufre una infeccin. Deben consultar a su mdico para iniciar un tratamiento.  Durante el tratamiento:  Teacher, music o use preservativos de [employer].  No se haga duchas vaginales.  No consuma alcohol a menos que el mdico lo autorice.  No amamante a menos que el mdico la autorice. SOLICITE AYUDA SI:  No mejora luego de 3 das de tratamiento.  Observa una secrecin (prdida) de color gris ms abundante que proviene de la vagina.  Siente ms dolor que antes.  Tiene fiebre. ASEGRESE DE QUE:   Comprende estas instrucciones.  Controlar su afeccin.  Recibir ayuda de inmediato si no mejora o si empeora.   Esta informacin no tiene Marine scientist el consejo del mdico. Asegrese de hacerle al mdico cualquier pregunta que tenga.   Document Released: 09/03/2008 Document Revised: 06/28/2014 Elsevier Interactive Patient Education [employer].

## 2015-09-24 NOTE — ED Notes (Signed)
Pt stated she is unable to give a urine sample at this time
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
## 2015-10-06 ENCOUNTER — Encounter: Payer: Self-pay | Admitting: Internal Medicine

## 2015-10-06 ENCOUNTER — Ambulatory Visit (INDEPENDENT_AMBULATORY_CARE_PROVIDER_SITE_OTHER): Payer: Self-pay | Admitting: Internal Medicine

## 2015-10-06 ENCOUNTER — Other Ambulatory Visit: Payer: Self-pay | Admitting: Internal Medicine

## 2015-10-06 ENCOUNTER — Encounter: Payer: Self-pay | Admitting: Licensed Clinical Social Worker

## 2015-10-06 VITALS — BP 127/74 | HR 58 | Temp 98.3°F | Ht 62.0 in | Wt 113.2 lb

## 2015-10-06 DIAGNOSIS — M5431 Sciatica, right side: Secondary | ICD-10-CM

## 2015-10-06 DIAGNOSIS — F419 Anxiety disorder, unspecified: Secondary | ICD-10-CM

## 2015-10-06 DIAGNOSIS — G4700 Insomnia, unspecified: Secondary | ICD-10-CM

## 2015-10-06 DIAGNOSIS — M5441 Lumbago with sciatica, right side: Secondary | ICD-10-CM

## 2015-10-06 DIAGNOSIS — G8929 Other chronic pain: Secondary | ICD-10-CM

## 2015-10-06 DIAGNOSIS — Z599 Problem related to housing and economic circumstances, unspecified: Secondary | ICD-10-CM

## 2015-10-06 DIAGNOSIS — Z598 Other problems related to housing and economic circumstances: Secondary | ICD-10-CM

## 2015-10-06 DIAGNOSIS — M5432 Sciatica, left side: Principal | ICD-10-CM

## 2015-10-06 DIAGNOSIS — Z90710 Acquired absence of both cervix and uterus: Secondary | ICD-10-CM

## 2015-10-06 MED ORDER — CAPSAICIN 0.1 % EX CREA: 1.0000 g | TOPICAL_CREAM | Freq: Three times a day (TID) | CUTANEOUS | Status: DC | PRN

## 2015-10-06 MED ORDER — CLONAZEPAM 1 MG PO TABS: 1.0000 mg | ORAL_TABLET | Freq: Every day | ORAL | Status: DC

## 2015-10-06 MED ORDER — ZOLPIDEM TARTRATE 5 MG PO TABS: 5.0000 mg | ORAL_TABLET | Freq: Every evening | ORAL | Status: DC | PRN

## 2015-10-06 MED FILL — clonazePAM 1 MG TABS: 1 | 30 days supply | Qty: 30 | Fill #0

## 2015-10-06 NOTE — Assessment & Plan Note (Signed)
Pt had a hysterectomy in 2006 due to a fibroid. Denies hx of cancer. She no longer needs pap smears.

## 2015-10-06 NOTE — Assessment & Plan Note (Signed)
Pt has chronic lower back pain and has been referred to ortho in the past. Per RN pt was called for an appt by [facility] (has orange card) and told them she did not need to come in yet for her pain. She was told to call them back as needed. She has received spinal injections in the past by IR. She now has rt sided back pain. Denies saddle anesthesia and urinary/bowel incontinence.   - referral placed for IR.  - social work spoke with patient today after she expressed difficulty in affording capsaicin cream. When asked how she affords her klonopin or [place] she said she was unsure of the price of them. SW is providing pt with money to buy capsaicin cream

## 2015-10-06 NOTE — Assessment & Plan Note (Signed)
SW met with patient today after she states that she cannot afford capsaicin cream and had no money even for food. SW is providing pt with financial resources.

## 2015-10-06 NOTE — Assessment & Plan Note (Signed)
Stable, refilled ambien.

## 2015-10-06 NOTE — Progress Notes (Signed)
[name] was referred to CSW for medication assistance.  CSW utilized video interpreter and met with patient following scheduled [facility] appointment.  Pt is current with her [facility], however, pt states she only has $5 at this time.  Ms. Edsel Petrin aware her current medications are $4/each in addition to an OTC medication.  [name] is employed but now with one income, due to her husband/significant other's incarceration.  CSW placed call to [place] to confirm current medication copay, pt was correct with the cost.  Outpatient pharmacy does not have Capzasin in-stock.  CSW placed call to pt's pharmacy on chart, [facility] to obtain cost of OTC Capzasin.  [facility] staff states cost is $15.89 with varying sizes.   Pt has not utilized [facility] assistance funds for medication copayments.  [name] provided with $20 cash to purchase: Ambien and Klonopin from [place] and Capzacin OTC cream from [facility] pharmacy.  Pt informed [place] unable to provide assistance on a regular basis.  Pt aware and thankful.
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
## 2015-10-06 NOTE — Assessment & Plan Note (Signed)
Pt was started on klonopin initially by [employer]. She had financial difficulty and IMTS clinic started to prescribe it for her. She states her panic attacks are controlled w/ klonopin '1mg'$  daily, and sometimes she will only take have a tab of klonopin. She has now re established with [facility] and last saw them 3 weeks ago. She was started on zoloft '100mg'$  BID by them.   - refilled klonopin - informed patient that at some point in time we will attempt to start weaning her off of klonopin. Pt is very hesitant stating that it will not work and she has tried it before.

## 2015-10-07 NOTE — Progress Notes (Signed)
Case discussed with Dr. Truong at the time of the visit.  We reviewed the resident's history and exam and pertinent patient test results.  I agree with the assessment, diagnosis, and plan of care documented in the resident's note. 

## 2015-10-20 ENCOUNTER — Other Ambulatory Visit: Payer: Self-pay | Admitting: Internal Medicine

## 2015-10-20 DIAGNOSIS — M5432 Sciatica, left side: Principal | ICD-10-CM

## 2015-10-20 DIAGNOSIS — M545 Low back pain, unspecified: Secondary | ICD-10-CM

## 2015-10-20 DIAGNOSIS — G8929 Other chronic pain: Secondary | ICD-10-CM

## 2015-10-20 DIAGNOSIS — M5431 Sciatica, right side: Secondary | ICD-10-CM

## 2015-10-21 ENCOUNTER — Other Ambulatory Visit: Payer: Self-pay

## 2015-10-21 ENCOUNTER — Other Ambulatory Visit: Payer: Self-pay | Admitting: Internal Medicine

## 2015-10-21 ENCOUNTER — Ambulatory Visit
Admission: RE | Admit: 2015-10-21 | Discharge: 2015-10-21 | Disposition: A | Discharge status: Home / Self Care | Payer: No Typology Code available for payment source | Source: Ambulatory Visit | Attending: Internal Medicine | Admitting: Internal Medicine

## 2015-10-21 DIAGNOSIS — M545 Low back pain, unspecified: Secondary | ICD-10-CM

## 2015-10-21 DIAGNOSIS — G8929 Other chronic pain: Secondary | ICD-10-CM

## 2015-10-21 MED ORDER — IOPAMIDOL (ISOVUE-M 200) INJECTION 41%
1.0000 mL | Freq: Once | INTRAMUSCULAR | Status: AC
  Administered 2015-10-21: 1 mL via EPIDURAL

## 2015-10-21 MED ORDER — METHYLPREDNISOLONE ACETATE 40 MG/ML INJ SUSP (RADIOLOG
120.0000 mg | Freq: Once | INTRAMUSCULAR | Status: AC
  Administered 2015-10-21: 120 mg via EPIDURAL

## 2015-10-21 NOTE — Discharge Instructions (Signed)
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
## 2015-10-27 ENCOUNTER — Other Ambulatory Visit: Payer: Self-pay

## 2015-11-04 ENCOUNTER — Other Ambulatory Visit: Payer: Self-pay | Admitting: Internal Medicine

## 2015-11-05 MED FILL — clonazePAM 1 MG TABS: 1 | 30 days supply | Qty: 30 | Fill #0

## 2015-11-05 NOTE — Telephone Encounter (Signed)
Called to pharm 

## 2015-12-04 ENCOUNTER — Other Ambulatory Visit: Payer: Self-pay | Admitting: Internal Medicine

## 2015-12-04 NOTE — Telephone Encounter (Signed)
Las appt 4/17; no f/u scheduled.

## 2015-12-05 MED FILL — clonazePAM 1 MG TABS: 1 | 30 days supply | Qty: 30 | Fill #0

## 2015-12-05 MED FILL — ZOLPIDEM TARTRATE 5 MG TAB: 5 | 30 days supply | Qty: 30 | Fill #0

## 2015-12-05 NOTE — Telephone Encounter (Signed)
Clonazepam rx called to [place].

## 2016-01-07 ENCOUNTER — Other Ambulatory Visit: Payer: Self-pay

## 2016-01-07 NOTE — Telephone Encounter (Signed)
Requesting klonopin to be filled.

## 2016-01-08 MED ORDER — CLONAZEPAM 1 MG PO TABS: 1.0000 mg | ORAL_TABLET | Freq: Every day | ORAL | Status: DC

## 2016-01-08 NOTE — Telephone Encounter (Signed)
Pt states klonopin is not at the pharmacy. Please call pt back.

## 2016-01-08 NOTE — Telephone Encounter (Signed)
Called to pharm 

## 2016-01-09 MED FILL — clonazePAM 1 MG TABS: 1 | 30 days supply | Qty: 30 | Fill #0

## 2016-02-04 ENCOUNTER — Ambulatory Visit: Payer: No Typology Code available for payment source

## 2016-02-16 ENCOUNTER — Ambulatory Visit (INDEPENDENT_AMBULATORY_CARE_PROVIDER_SITE_OTHER): Payer: No Typology Code available for payment source | Admitting: Internal Medicine

## 2016-02-16 VITALS — BP 112/64 | HR 56 | Temp 97.6°F | Ht 62.0 in | Wt 116.5 lb

## 2016-02-16 DIAGNOSIS — M5441 Lumbago with sciatica, right side: Secondary | ICD-10-CM

## 2016-02-16 DIAGNOSIS — M5442 Lumbago with sciatica, left side: Secondary | ICD-10-CM

## 2016-02-16 DIAGNOSIS — M5432 Sciatica, left side: Principal | ICD-10-CM

## 2016-02-16 DIAGNOSIS — H538 Other visual disturbances: Secondary | ICD-10-CM

## 2016-02-16 DIAGNOSIS — G8929 Other chronic pain: Secondary | ICD-10-CM

## 2016-02-16 DIAGNOSIS — M5431 Sciatica, right side: Secondary | ICD-10-CM

## 2016-02-16 DIAGNOSIS — H539 Unspecified visual disturbance: Secondary | ICD-10-CM

## 2016-02-16 DIAGNOSIS — G4700 Insomnia, unspecified: Secondary | ICD-10-CM

## 2016-02-16 MED ORDER — IBUPROFEN 200 MG PO TABS: 800.0000 mg | ORAL_TABLET | Freq: Four times a day (QID) | ORAL | 2 refills | Status: DC | PRN

## 2016-02-16 MED ORDER — ZOLPIDEM TARTRATE 5 MG PO TABS: 5.0000 mg | ORAL_TABLET | Freq: Every evening | ORAL | 0 refills | Status: DC | PRN

## 2016-02-16 MED ORDER — KETOROLAC TROMETHAMINE 30 MG/ML IJ SOLN
30.0000 mg | Freq: Once | INTRAMUSCULAR | Status: AC
  Administered 2016-02-16: 30 mg via INTRAMUSCULAR

## 2016-02-16 MED FILL — clonazePAM 1 MG TABS: 1 | 30 days supply | Qty: 30 | Fill #1

## 2016-02-16 NOTE — Patient Instructions (Addendum)
It was a pleasure meeting you today!  1. Today we talked about your back pain. I have put in the order for your spinal injection. They will call you with the time and date of the procedure. 2. Today in clinic I gave you a Toradol injection. This should help your pain for several hours. I have also sent in a prescription strength Ibuprofen to try to help you with your pain until you can get the injection.  3. I have also put in a referral to an eye doctor to evaluate your vision. They will call you with an appointment.  4. Please follow up if your symptoms return

## 2016-02-18 NOTE — Assessment & Plan Note (Addendum)
Patient has chronic low back pain. She has seen ortho in the past who recommended surgery however patient reports she cannot be out of work for the time it takes to recover from that surgery. She has been seen by IR several times for spinal injections which seem to provide significant relief of her pain. She denies any saddle anesthesia or urinary/bowel incontinence.  -Referral placed for IR so she may receive the spinal injection -Patient received Toradol 30 mg IM today in clinic for her pain -Prescription sent for Ibuprofen to help ease symptoms until she can receive her injection
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
## 2016-02-18 NOTE — Assessment & Plan Note (Signed)
Patient reports seeing "floaters" for the past several months. Denies any other ocular symptoms. Patient has been referred to ophthalmology for further evaluation.

## 2016-02-20 NOTE — Progress Notes (Signed)
Internal Medicine Clinic Attending  I saw and evaluated the patient.  I personally confirmed the key portions of the history and exam documented by Dr. Molt and I reviewed pertinent patient test results.  The assessment, diagnosis, and plan were formulated together and I agree with the documentation in the resident's note. 

## 2016-03-17 MED FILL — clonazePAM 1 MG TABS: 1 | 30 days supply | Qty: 30 | Fill #2

## 2016-03-30 ENCOUNTER — Ambulatory Visit: Payer: No Typology Code available for payment source

## 2016-04-06 ENCOUNTER — Ambulatory Visit
Admission: RE | Admit: 2016-04-06 | Discharge: 2016-04-06 | Disposition: A | Discharge status: Home / Self Care | Payer: No Typology Code available for payment source | Source: Ambulatory Visit | Attending: Student in an Organized Health Care Education/Training Program | Admitting: Student in an Organized Health Care Education/Training Program

## 2016-04-06 DIAGNOSIS — M5431 Sciatica, right side: Secondary | ICD-10-CM

## 2016-04-06 DIAGNOSIS — M5432 Sciatica, left side: Principal | ICD-10-CM

## 2016-04-06 MED ORDER — METHYLPREDNISOLONE ACETATE 40 MG/ML INJ SUSP (RADIOLOG
120.0000 mg | Freq: Once | INTRAMUSCULAR | Status: AC
  Administered 2016-04-06: 120 mg via EPIDURAL

## 2016-04-06 MED ORDER — IOPAMIDOL (ISOVUE-M 200) INJECTION 41%
1.0000 mL | Freq: Once | INTRAMUSCULAR | Status: AC
  Administered 2016-04-06: 1 mL via EPIDURAL

## 2016-04-06 NOTE — Discharge Instructions (Addendum)
Inyeccin epidural con corticoides (Epidural Steroid Injection) Una inyeccin epidural con corticoides se administra para Best boy en el cuello, la espalda o las piernas, causado por irritacin o inflamacin de una raz nerviosa. Este procedimiento implica la inyeccin de un corticoide y un medicamento que adormece (anestsico) en el espacio epidural. El espacio epidural es el que se encuentra entre la membrana externa de la mdula espinal y los huesos que forman la columna vertebral (vrtebras).  INFORME A SU MDICO:   Cualquier alergia que tenga.  Todos los [employer] [place], incluyendo vitaminas, hierbas, gotas oftlmicas, cremas y medicamentos de venta libre como la aspirina.  Problemas previos que usted o los [employer] de su familia hayan tenido con el uso de anestsicos.  Enfermedades de la sangre o problemas con la coagulacin que haya padecido.  Cirugas previas.  Padecimientos mdicos. RIESGOS Y COMPLICACIONES Generalmente, este es un procedimiento seguro. Sin embargo, Games developer procedimiento, pueden surgir complicaciones. Las complicaciones posibles por una inyeccin epidural de corticoides son:  Dolor de [place].  Hemorragias.  Infeccin.  Reaccin alrgica al medicamento.  Dao a los nervios. La respuesta a este procedimiento depende de la causa subyacente del dolor y de su duracin. Las personas que sufren dolor por largos perodos (crnico) se beneficiarn menos que aquellas personas cuyo dolor es intenso y de aparicin repentina. ANTES DEL PROCEDIMIENTO   Consulte a su mdico si debe cambiar o suspender los medicamentos que toma habitualmente. Es posible que le indique que deje de tomar anticoagulantes durante [employer] antes del procedimiento.  Le darn medicamentos para calmar la ansiedad.  Pdale a alguna persona que la lleve a su casa luego del procedimiento. PROCEDIMIENTO   Durante el procedimiento permanecer despierto. Le darn un  medicamento para relajarse.  Le pedirn que se recueste [employer].  Higienizarn el sitio de la inyeccin.  Se adormecer el sitio de la inyeccin con un medicamento (anestesia local).  Le insertarn una aguja a travs de la piel hasta el espacio epidural.  El mdico utilizar un equipo de rayos X para asegurarse de que el corticoide llega lo ms cerca posible del nervio afectado. Podr sentir Psychologist, counselling mnima en este momento.  Una vez que la aguja est en la posicin correcta, le inyectar el anestsico local y el corticoide en el espacio epidural.  Luego retirar la aguja y colocar un vendaje en el sitio de la inyeccin. DESPUS DEL PROCEDIMIENTO   Ser controlado durante un breve tiempo antes de volver a su casa.  Podr sentir debilidad o adormecimiento en el brazo o la pierna, lo que desaparecer luego de algunas horas.  Podr comer, beber y tomar sus medicamentos habituales.  Podr sentir un poco de dolor en el sitio de la inyeccin.   Esta informacin no tiene Marine scientist el consejo del mdico. Asegrese de hacerle al mdico cualquier pregunta que tenga.   Document Released: 02/07/2013 Elsevier Interactive Patient Education 2016 [place] Procedure Spinal Discharge Instruction Sheet  1. You may resume a regular diet and any medications that you routinely take (including pain medications).  2. No driving day of procedure.  3. Light activity throughout the rest of the day.  Do not do any strenuous work, exercise, bending or lifting.  The day following the procedure, you can resume normal physical activity but you should refrain from exercising or physical therapy for at least three days thereafter.   Common Side Effects:   Headaches- take your usual medications as directed  by your physician.  Increase your fluid intake.  Caffeinated beverages may be helpful.  Lie flat in bed until your headache resolves.   Restlessness or inability to  sleep- you may have trouble sleeping for the next few days.  Ask your referring physician if you need any medication for sleep.   Facial flushing or redness- should subside within a few days.   Increased pain- a temporary increase in pain a day or two following your procedure is not unusual.  Take your pain medication as prescribed by your referring physician.   Leg cramps  Please contact our office at [phone] for the following symptoms:  Fever greater than 100 degrees.  Headaches unresolved with medication after 2-3 days.  Increased swelling, pain, or redness at injection site.  Thank you for visiting our office.

## 2016-04-15 ENCOUNTER — Ambulatory Visit (HOSPITAL_COMMUNITY)
Admission: EM | Admit: 2016-04-15 | Discharge: 2016-04-15 | Disposition: A | Discharge status: Home / Self Care | Payer: No Typology Code available for payment source | Attending: Internal Medicine | Admitting: Internal Medicine

## 2016-04-15 ENCOUNTER — Encounter (HOSPITAL_COMMUNITY): Payer: Self-pay | Admitting: Emergency Medicine

## 2016-04-15 DIAGNOSIS — N739 Female pelvic inflammatory disease, unspecified: Secondary | ICD-10-CM | POA: Insufficient documentation

## 2016-04-15 DIAGNOSIS — Z79899 Other long term (current) drug therapy: Secondary | ICD-10-CM | POA: Insufficient documentation

## 2016-04-15 DIAGNOSIS — N730 Acute parametritis and pelvic cellulitis: Secondary | ICD-10-CM

## 2016-04-15 DIAGNOSIS — Z202 Contact with and (suspected) exposure to infections with a predominantly sexual mode of transmission: Secondary | ICD-10-CM

## 2016-04-15 MED ORDER — AZITHROMYCIN 250 MG PO TABS: 1000.0000 mg | ORAL_TABLET | Freq: Once | ORAL | Status: DC

## 2016-04-15 MED ORDER — CEFTRIAXONE SODIUM 250 MG IJ SOLR
250.0000 mg | Freq: Once | INTRAMUSCULAR | Status: AC
  Administered 2016-04-15: 250 mg via INTRAMUSCULAR

## 2016-04-15 MED ORDER — CEFTRIAXONE SODIUM 250 MG IJ SOLR
INTRAMUSCULAR | Status: AC
  Filled 2016-04-15: qty 250

## 2016-04-15 MED ORDER — DOXYCYCLINE HYCLATE 100 MG PO CAPS: 100.0000 mg | ORAL_CAPSULE | Freq: Two times a day (BID) | ORAL | 0 refills | Status: AC

## 2016-04-15 MED ORDER — METRONIDAZOLE 500 MG PO TABS: 500.0000 mg | ORAL_TABLET | Freq: Two times a day (BID) | ORAL | 0 refills | Status: DC

## 2016-04-15 MED ORDER — STERILE WATER FOR INJECTION IJ SOLN
INTRAMUSCULAR | Status: AC
  Filled 2016-04-15: qty 10

## 2016-04-15 MED FILL — DOXYCYCLINE HYCLATE 100 MG: 100 | 14 days supply | Qty: 28 | Fill #0

## 2016-04-15 NOTE — Discharge Instructions (Addendum)
Rocephin '250mg'$  injection given at urgent care today.  Test results will be available in a few days; the urgent care will contact you if any results are abnormal.  Prescriptions for doxycycline and metronidazole were sent to the [facility].  Recheck for increasing pelvic pain, new fever >100.5, or if not starting to feel better in a few days.
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
## 2016-04-15 NOTE — ED Triage Notes (Signed)
PT reports "jello like" clear vaginal discharge and lower abdominal pain for 3 days. PT's partner reports that he has recently been treated for gonorrhea. PT also reports nausea and headache.

## 2016-04-16 LAB — CERVICOVAGINAL ANCILLARY ONLY
Chlamydia: NEGATIVE
Neisseria Gonorrhea: POSITIVE — AB
WET PREP (BD AFFIRM): POSITIVE — AB

## 2016-04-16 MED FILL — clonazePAM 1 MG TABS: 1 | 30 days supply | Qty: 30 | Fill #3

## 2016-04-21 ENCOUNTER — Telehealth (HOSPITAL_COMMUNITY): Payer: Self-pay | Admitting: Emergency Medicine

## 2016-04-21 NOTE — Telephone Encounter (Signed)
-----   Message from Sherlene Shams, MD sent at 04/17/2016 12:04 PM EDT ----- Please let patient and health department know that test for gonorrhea was positive.   This was treated at the urgent care visit 04/15/16 with IM rocephin '250mg'$  and po zithromax 1g, and rx for doxycycline.   Test for gardnerella (bacterial vaginosis) was also positive; rx for metronidazole was given at the UC.   Recheck or folllowup with PCP Bethany Molt for further evaluation if symptoms persist.  LM

## 2016-04-21 NOTE — Telephone Encounter (Signed)
Called pt and notified of recent lab results Pt ID'd properly... Reports feeling better and sx have subsided Adv pt if sx are not getting better to return or to f/u w/PCP Education on safe sex given Also adv pt to notify partner(s) Faxed documentation to [facility] Pt verb understanding.

## 2016-05-18 MED FILL — clonazePAM 1 MG TABS: 1 | 30 days supply | Qty: 30 | Fill #4

## 2016-05-25 ENCOUNTER — Encounter: Payer: Self-pay | Admitting: Internal Medicine
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
## 2016-06-17 MED FILL — clonazePAM 1 MG TABS: 1 | 30 days supply | Qty: 30 | Fill #5

## 2016-07-13 ENCOUNTER — Ambulatory Visit (INDEPENDENT_AMBULATORY_CARE_PROVIDER_SITE_OTHER): Payer: No Typology Code available for payment source | Admitting: Internal Medicine

## 2016-07-13 VITALS — BP 114/67 | HR 62 | Temp 97.9°F | Ht 62.0 in | Wt 114.8 lb

## 2016-07-13 DIAGNOSIS — Z8711 Personal history of peptic ulcer disease: Secondary | ICD-10-CM

## 2016-07-13 DIAGNOSIS — M5441 Lumbago with sciatica, right side: Secondary | ICD-10-CM

## 2016-07-13 DIAGNOSIS — G4700 Insomnia, unspecified: Secondary | ICD-10-CM

## 2016-07-13 DIAGNOSIS — F419 Anxiety disorder, unspecified: Secondary | ICD-10-CM

## 2016-07-13 DIAGNOSIS — Z79899 Other long term (current) drug therapy: Secondary | ICD-10-CM

## 2016-07-13 DIAGNOSIS — R110 Nausea: Secondary | ICD-10-CM

## 2016-07-13 DIAGNOSIS — M5431 Sciatica, right side: Secondary | ICD-10-CM

## 2016-07-13 DIAGNOSIS — R1013 Epigastric pain: Secondary | ICD-10-CM

## 2016-07-13 DIAGNOSIS — G8929 Other chronic pain: Secondary | ICD-10-CM

## 2016-07-13 DIAGNOSIS — F1011 Alcohol abuse, in remission: Secondary | ICD-10-CM

## 2016-07-13 DIAGNOSIS — R197 Diarrhea, unspecified: Secondary | ICD-10-CM

## 2016-07-13 DIAGNOSIS — G894 Chronic pain syndrome: Secondary | ICD-10-CM

## 2016-07-13 DIAGNOSIS — Z23 Encounter for immunization: Secondary | ICD-10-CM

## 2016-07-13 MED ORDER — CLONAZEPAM 1 MG PO TABS: 1.0000 mg | ORAL_TABLET | Freq: Every day | ORAL | 0 refills | Status: DC

## 2016-07-13 MED ORDER — IBUPROFEN 400 MG PO TABS: 400.0000 mg | ORAL_TABLET | Freq: Four times a day (QID) | ORAL | 0 refills | Status: DC | PRN

## 2016-07-13 MED ORDER — KETOROLAC TROMETHAMINE 30 MG/ML IJ SOLN
30.0000 mg | Freq: Once | INTRAMUSCULAR | Status: AC
  Administered 2016-07-13: 30 mg via INTRAMUSCULAR

## 2016-07-13 MED ORDER — ZOLPIDEM TARTRATE 5 MG PO TABS: 5.0000 mg | ORAL_TABLET | Freq: Every evening | ORAL | 0 refills | Status: DC | PRN

## 2016-07-13 MED FILL — ZOLPIDEM TARTRATE 5 MG TAB: 5 | 30 days supply | Qty: 30 | Fill #0

## 2016-07-13 NOTE — Patient Instructions (Addendum)
It was a pleasure to see you today Karen [name].  We gave a shot of toradol today as well as the flu vaccine.  You can take tylenol or ibuprofen or both for your headaches and back pain in the meantime.  I have sent a new referral to interventional radiology about your paraspinal injections.  Your abdominal pain is most likely due to gallbladder dysfunction or maybe a stress reaction due to your lack of sleep and anxiety that can increase the risk of stomach ulcers. If it persists for 2 weeks I will recommend an ultrasound of your abdomen to take a look at this.  We are checking your blood work today to rule out an obvious infectious cause.

## 2016-07-13 NOTE — Progress Notes (Signed)
Ambien and Klonopin rxs faxed [facility] per pt's request.
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
## 2016-07-14 LAB — CBC
HEMATOCRIT: 36.7 % (ref 34.0–46.6)
Hemoglobin: 11.5 g/dL (ref 11.1–15.9)
MCH: 29.3 pg (ref 26.6–33.0)
MCHC: 31.3 g/dL — AB (ref 31.5–35.7)
MCV: 94 fL (ref 79–97)
Platelets: 317 10*3/uL (ref 150–379)
RBC: 3.92 x10E6/uL (ref 3.77–5.28)
RDW: 14.9 % (ref 12.3–15.4)
WBC: 7.1 10*3/uL (ref 3.4–10.8)

## 2016-07-15 MED ORDER — IBUPROFEN 400 MG PO TABS: 400.0000 mg | ORAL_TABLET | Freq: Four times a day (QID) | ORAL | 0 refills | Status: DC | PRN

## 2016-07-15 NOTE — Assessment & Plan Note (Signed)
She has chronic lower back pain with radiation to the right leg. She is not having any alarming symptoms but describes her pain as 10 out of 10 today. She last saw interventional radiology about 4-5 months ago for spinal injection which she says helps her pain very significantly. right lower back pain is easily reproduced on a straight leg raise during examination which seems consistent with this. She has been this is anything to do for pain in the meantime so I offered intramuscular Toradol injection. Given her concurrent GI symptoms chronic long-term oral NSAIDs could hopefully be avoided with an alternate treatment plan.  -Referral placed to interventional radiology for paraspinal injections -'30mg'$  toradol IM in clinic today

## 2016-07-15 NOTE — Assessment & Plan Note (Signed)
She continues to be treated on Klonopin 1 mg daily plus sertraline 100 mg twice a day apparently prescribed through [facility]. I'm not sure she is working on any weaning plan at this time but my review today with [facility] shows appropriate refill history with her once daily usage.  -Continue sertraline '100mg'$  -Reorder Klonopin '1mg'$  daily for 2 weeks prescription until her follow-up with PCP next month.

## 2016-07-15 NOTE — Assessment & Plan Note (Signed)
Flu shot given today

## 2016-07-15 NOTE — Assessment & Plan Note (Addendum)
I suspect biliary colic may be contributing to [name] having abdominal pain, nausea, and diarrhea. This probably based on the pain being worst in the right upper quadrant or possibly epigastric area. The episodes are worsened after eating and this is been a problem for her about a year ago as well.  She also reports being under very high stress levels due to her son sickness [place] in the past month which correlates with her symptoms getting worse. This may place her at higher risk of a peptic ulcer disease that could also explain her abdominal pain. She does have a previous history of peptic ulcer disease years ago and had been taking famotidine for this. I'm not sure why this would cause diarrhea unless it has significant bleeding which I do not believe is the case today.  I'm unsure how this is otherwise altering her diet and lifestyle habits they could be contributing to GI symptoms. The total duration of this is too long to be explained by a simple gastroenteritis. However she denies any fevers chills or discolored stools that alarm me for a inflammatory process. I will also plan to check a CBC to rule out significant anemia suggesting GI blood loss or a high leukocytosis that may prompt further workup for an infectious diarrhea.  -I recommend symptomatic treatment with loperamide/Imodium -Checking CBC  -If symptoms persist another week or 2 would consider a right upper quadrant ultrasound

## 2016-07-15 NOTE — Progress Notes (Signed)
Case discussed with Dr. Benjamine Mola soon after the resident saw the patient.  We reviewed the resident's history and exam and pertinent patient test results.  I agree with the assessment, diagnosis and plan of care documented in the resident's note.

## 2016-07-15 NOTE — Assessment & Plan Note (Signed)
She is an ongoing problem with insomnia and says it is worse than usual due to stress at this time.  For now I will refill her Ambien prescription to cover 2 weeks until his follow-up appointment with her PCP.

## 2016-07-16 MED FILL — clonazePAM 1 MG TABS: 1 | 15 days supply | Qty: 15 | Fill #0

## 2016-07-22 ENCOUNTER — Other Ambulatory Visit: Payer: Self-pay | Admitting: Internal Medicine

## 2016-07-22 ENCOUNTER — Other Ambulatory Visit: Payer: Self-pay | Admitting: Student in an Organized Health Care Education/Training Program

## 2016-07-22 DIAGNOSIS — M5431 Sciatica, right side: Secondary | ICD-10-CM

## 2016-07-26 ENCOUNTER — Ambulatory Visit (INDEPENDENT_AMBULATORY_CARE_PROVIDER_SITE_OTHER): Payer: No Typology Code available for payment source | Admitting: Internal Medicine

## 2016-07-26 VITALS — BP 127/71 | HR 58 | Temp 98.0°F | Wt 117.5 lb

## 2016-07-26 DIAGNOSIS — Z79899 Other long term (current) drug therapy: Secondary | ICD-10-CM

## 2016-07-26 DIAGNOSIS — M549 Dorsalgia, unspecified: Secondary | ICD-10-CM

## 2016-07-26 DIAGNOSIS — I10 Essential (primary) hypertension: Secondary | ICD-10-CM

## 2016-07-26 DIAGNOSIS — G4700 Insomnia, unspecified: Secondary | ICD-10-CM

## 2016-07-26 DIAGNOSIS — J4520 Mild intermittent asthma, uncomplicated: Secondary | ICD-10-CM

## 2016-07-26 DIAGNOSIS — J45909 Unspecified asthma, uncomplicated: Secondary | ICD-10-CM

## 2016-07-26 DIAGNOSIS — G43119 Migraine with aura, intractable, without status migrainosus: Secondary | ICD-10-CM

## 2016-07-26 DIAGNOSIS — F419 Anxiety disorder, unspecified: Secondary | ICD-10-CM

## 2016-07-26 DIAGNOSIS — G8929 Other chronic pain: Secondary | ICD-10-CM

## 2016-07-26 MED ORDER — ZOLPIDEM TARTRATE 5 MG PO TABS: 5.0000 mg | ORAL_TABLET | Freq: Every evening | ORAL | 0 refills | Status: DC | PRN

## 2016-07-26 MED ORDER — CLONAZEPAM 1 MG PO TABS: 1.0000 mg | ORAL_TABLET | Freq: Two times a day (BID) | ORAL | 0 refills | Status: DC

## 2016-07-26 MED ORDER — SUMATRIPTAN SUCCINATE 50 MG PO TABS: 50.0000 mg | ORAL_TABLET | Freq: Once | ORAL | 1 refills | Status: DC | PRN

## 2016-07-26 MED ORDER — SUMATRIPTAN SUCCINATE 6 MG/0.5ML ~~LOC~~ SOLN
6.0000 mg | Freq: Once | SUBCUTANEOUS | Status: AC
  Administered 2016-07-26: 6 mg via SUBCUTANEOUS

## 2016-07-26 MED ORDER — ALBUTEROL SULFATE HFA 108 (90 BASE) MCG/ACT IN AERS: 2.0000 | INHALATION_SPRAY | Freq: Four times a day (QID) | RESPIRATORY_TRACT | 0 refills | Status: DC | PRN

## 2016-07-26 MED ORDER — PROMETHAZINE HCL 12.5 MG PO TABS: 12.5000 mg | ORAL_TABLET | Freq: Four times a day (QID) | ORAL | 0 refills | Status: DC | PRN
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
## 2016-07-26 NOTE — Patient Instructions (Signed)
It was an absolute pleasure seeing you today! I am so sorry you are going through so much right now. Please contact the clinic if you need me.   1. Today we talked about your migraines. For this I gave you an injection of a medication called Sumatriptan. This medicine is an "Abortive" medicine meaning that it should only be taken when you are developing a migraine. You may take 1 pill at the onset of migraine, and another one 2 hours later only if needed.  2. For tension type headaches, take Ibuprofen, naproxen or Tylenol.  3. Today we also talked about your anxiety and insomnia. No doubt these are being made worse by your current stressors. I have sent in a refill of Ambien and Klonopin as well.  4. Please come back in 1 month so we can see how you are doing!!! 5. Take care!

## 2016-07-27 MED FILL — clonazePAM 1 MG TABS: 1 | 30 days supply | Qty: 60 | Fill #0

## 2016-07-27 NOTE — Assessment & Plan Note (Signed)
Could not find PFTs in chart however chart review does show she has been taking albuterol since at least 2010. Currently out of inhalers and has faint exp wheezes on examination.  -Refilled albuterol PRN -Recommended pulmonary function tests

## 2016-07-27 NOTE — Progress Notes (Signed)
Internal Medicine Clinic Attending  Case discussed with Dr. Molt at the time of the visit.  We reviewed the resident's history and exam and pertinent patient test results.  I agree with the assessment, diagnosis, and plan of care documented in the resident's note. 

## 2016-07-27 NOTE — Assessment & Plan Note (Signed)
Has baseline anxiety disorder currently complicated by acute stress (son terminal, unable to visit). Reports she is dealing with this "okay" and that she feels Zoloft and Klonopin are helping. Had plans to taper Klonopin however given patients situation, will hold off.  -Continue Zoloft -Continue Klonopin, 1 month supply -Return 1 month for re-evaluation

## 2016-07-27 NOTE — Assessment & Plan Note (Addendum)
Denies any neurologic symptoms. Reports toradol ineffective during last visit. Requests an abortive medication and antinausea for when it happens again.  -Sumatriptan given in clinic today with prompt improvement of symptoms within 10 minutes -Rx given for Sumatriptan and instructions given for proper use -Rx given for phenergan for nausea

## 2016-07-27 NOTE — Assessment & Plan Note (Signed)
Chronic issue exacerbated by acute stressors, as outlined above. Was given a 14 day supply of her Ambien about 2 weeks ago and states this is helping her tremendously. Had plans to taper, however given current situation will continue and taper once improved.

## 2016-07-29 ENCOUNTER — Ambulatory Visit
Admission: RE | Admit: 2016-07-29 | Discharge: 2016-07-29 | Disposition: A | Discharge status: Home / Self Care | Payer: No Typology Code available for payment source | Source: Ambulatory Visit | Attending: Internal Medicine | Admitting: Internal Medicine

## 2016-07-29 ENCOUNTER — Other Ambulatory Visit: Payer: Self-pay | Admitting: Internal Medicine

## 2016-07-29 DIAGNOSIS — M5431 Sciatica, right side: Secondary | ICD-10-CM

## 2016-07-29 MED ORDER — METHYLPREDNISOLONE ACETATE 40 MG/ML INJ SUSP (RADIOLOG
120.0000 mg | Freq: Once | INTRAMUSCULAR | Status: AC
  Administered 2016-07-29: 120 mg via EPIDURAL

## 2016-07-29 MED ORDER — IOPAMIDOL (ISOVUE-M 200) INJECTION 41%
1.0000 mL | Freq: Once | INTRAMUSCULAR | Status: AC
  Administered 2016-07-29: 1 mL via EPIDURAL

## 2016-07-29 NOTE — Discharge Instructions (Signed)

## 2016-09-14 ENCOUNTER — Other Ambulatory Visit: Payer: Self-pay | Admitting: Internal Medicine

## 2016-09-14 DIAGNOSIS — F419 Anxiety disorder, unspecified: Secondary | ICD-10-CM

## 2016-09-15 ENCOUNTER — Other Ambulatory Visit: Payer: Self-pay | Admitting: Internal Medicine

## 2016-09-15 DIAGNOSIS — F419 Anxiety disorder, unspecified: Secondary | ICD-10-CM

## 2016-09-15 MED FILL — clonazePAM 1 MG TABS: 1 | 30 days supply | Qty: 60 | Fill #0

## 2016-09-15 NOTE — Telephone Encounter (Signed)
Approved. OK to call in! Thanks!!

## 2016-09-15 NOTE — Telephone Encounter (Signed)
Klonopin called in to pharmacy but pharmacist is asking if this is PRN or scheduled twice daily? Thanks!

## 2016-09-15 NOTE — Telephone Encounter (Signed)
PRN. Thanks!

## 2016-09-15 NOTE — Telephone Encounter (Signed)
Nurse has phoned into pharmacy.Despina Hidden Cassady3/28/201811:56 AM

## 2016-09-19 ENCOUNTER — Other Ambulatory Visit: Payer: Self-pay

## 2016-09-19 ENCOUNTER — Emergency Department (HOSPITAL_COMMUNITY)
Admission: EM | Admit: 2016-09-19 | Discharge: 2016-09-19 | Disposition: A | Discharge status: Home / Self Care | Payer: No Typology Code available for payment source | Attending: Emergency Medicine | Admitting: Emergency Medicine

## 2016-09-19 ENCOUNTER — Emergency Department (HOSPITAL_COMMUNITY): Payer: No Typology Code available for payment source

## 2016-09-19 DIAGNOSIS — Z79899 Other long term (current) drug therapy: Secondary | ICD-10-CM | POA: Insufficient documentation

## 2016-09-19 DIAGNOSIS — M940 Chondrocostal junction syndrome [Tietze]: Secondary | ICD-10-CM | POA: Insufficient documentation

## 2016-09-19 DIAGNOSIS — J45909 Unspecified asthma, uncomplicated: Secondary | ICD-10-CM | POA: Insufficient documentation

## 2016-09-19 DIAGNOSIS — I10 Essential (primary) hypertension: Secondary | ICD-10-CM | POA: Insufficient documentation

## 2016-09-19 LAB — BASIC METABOLIC PANEL
ANION GAP: 9 (ref 5–15)
BUN: 12 mg/dL (ref 6–20)
CALCIUM: 9.5 mg/dL (ref 8.9–10.3)
CO2: 24 mmol/L (ref 22–32)
Chloride: 106 mmol/L (ref 101–111)
Creatinine, Ser: 0.71 mg/dL (ref 0.44–1.00)
GFR calc Af Amer: >60 mL/min (ref >60)
GFR calc non Af Amer: >60 mL/min (ref >60)
GLUCOSE: 108 mg/dL — AB (ref 65–99)
Potassium: 3.8 mmol/L (ref 3.5–5.1)
Sodium: 139 mmol/L (ref 135–145)

## 2016-09-19 LAB — CBC
HCT: 38.6 % (ref 36.0–46.0)
HEMOGLOBIN: 12.7 g/dL (ref 12.0–15.0)
MCH: 30.3 pg (ref 26.0–34.0)
MCHC: 32.9 g/dL (ref 30.0–36.0)
MCV: 92.1 fL (ref 78.0–100.0)
Platelets: 280 10*3/uL (ref 150–400)
RBC: 4.19 MIL/uL (ref 3.87–5.11)
RDW: 13.3 % (ref 11.5–15.5)
WBC: 8.9 10*3/uL (ref 4.0–10.5)

## 2016-09-19 LAB — I-STAT TROPONIN, ED: TROPONIN I, POC: 0 ng/mL (ref 0.00–0.08)

## 2016-09-19 MED ORDER — NAPROXEN 500 MG PO TABS: 500.0000 mg | ORAL_TABLET | Freq: Two times a day (BID) | ORAL | 0 refills | Status: DC

## 2016-09-19 MED ORDER — NAPROXEN 250 MG PO TABS
500.0000 mg | ORAL_TABLET | Freq: Once | ORAL | Status: AC
  Administered 2016-09-19: 500 mg via ORAL
  Filled 2016-09-19: qty 2

## 2016-09-19 MED ORDER — NAPROXEN 250 MG PO TABS: 500.0000 mg | ORAL_TABLET | Freq: Two times a day (BID) | ORAL | Status: DC
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
## 2016-09-19 NOTE — Discharge Instructions (Signed)
Take medications as needed for pain, follow-up with your primary care doctor next week to make sure symptoms are improving

## 2016-09-19 NOTE — ED Notes (Signed)
Pt stable, ambulatory, states understanding of discharge instructions 

## 2016-09-19 NOTE — ED Triage Notes (Signed)
Cp  X 2 weeks with nausea and vomiting states just had her son die  42 days ago

## 2016-09-20 NOTE — Telephone Encounter (Signed)
Called patient & informed her that Clonazepam should be taken twice a day as needed.

## 2016-09-30 ENCOUNTER — Ambulatory Visit: Payer: No Typology Code available for payment source

## 2016-09-30 ENCOUNTER — Encounter: Payer: Self-pay | Admitting: Internal Medicine

## 2016-10-12 ENCOUNTER — Ambulatory Visit: Payer: No Typology Code available for payment source

## 2016-10-19 ENCOUNTER — Ambulatory Visit (INDEPENDENT_AMBULATORY_CARE_PROVIDER_SITE_OTHER): Payer: No Typology Code available for payment source | Admitting: Internal Medicine

## 2016-10-19 ENCOUNTER — Encounter: Payer: Self-pay | Admitting: Internal Medicine

## 2016-10-19 VITALS — BP 114/47 | HR 60 | Temp 98.1°F | Ht 62.0 in | Wt 111.7 lb

## 2016-10-19 DIAGNOSIS — F339 Major depressive disorder, recurrent, unspecified: Secondary | ICD-10-CM

## 2016-10-19 DIAGNOSIS — F419 Anxiety disorder, unspecified: Secondary | ICD-10-CM

## 2016-10-19 DIAGNOSIS — Z634 Disappearance and death of family member: Secondary | ICD-10-CM

## 2016-10-19 DIAGNOSIS — G8929 Other chronic pain: Secondary | ICD-10-CM

## 2016-10-19 DIAGNOSIS — G43119 Migraine with aura, intractable, without status migrainosus: Secondary | ICD-10-CM

## 2016-10-19 DIAGNOSIS — M5441 Lumbago with sciatica, right side: Secondary | ICD-10-CM

## 2016-10-19 DIAGNOSIS — G43909 Migraine, unspecified, not intractable, without status migrainosus: Secondary | ICD-10-CM

## 2016-10-19 DIAGNOSIS — M5431 Sciatica, right side: Secondary | ICD-10-CM

## 2016-10-19 MED ORDER — CLONAZEPAM 1 MG PO TABS: 1.0000 mg | ORAL_TABLET | Freq: Two times a day (BID) | ORAL | 0 refills | Status: DC

## 2016-10-19 MED ORDER — SERTRALINE HCL 50 MG PO TABS: 50.0000 mg | ORAL_TABLET | Freq: Every day | ORAL | 2 refills | Status: DC

## 2016-10-19 MED ORDER — TERBINAFINE HCL 1 % EX CREA: 1.0000 "application " | TOPICAL_CREAM | Freq: Two times a day (BID) | CUTANEOUS | 0 refills | Status: DC

## 2016-10-19 NOTE — Patient Instructions (Addendum)
It was a pleasure seeing you again. I'm sorry you are going through a difficult time.  Today we talked about depression and panic attacks. I'm starting Zoloft 50 mg daily and refilling your Klonopin. Please take Klonopin only as needed. I need you to follow-up with a psychiatrist and counselor at [facility]. Please call and make an appointment.  I've also referred you for an injection in your back, they will call you with the appointment.
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
## 2016-10-19 NOTE — Assessment & Plan Note (Signed)
Patient reports frequent migraines/headaches since the death of her son 2 months ago. Reports sumatriptan helps however it is too expensive so she has been taking Excedrin Migraine nearly daily. The quality of headaches she is currently experiencing some more consistent with tension headache was appropriate given her current stressors. -Encouraged patient to attempt to wean off Excedrin Migraine and use sparingly -Encouraged use of ibuprofen, or naproxen, or Tylenol -Could consider adding meloxicam 7.5 mg as patient does have some response to ibuprofen

## 2016-10-19 NOTE — Assessment & Plan Note (Signed)
Starting Zoloft 50 mg today. Patient to follow-up in one month for follow-up. Instructed might take several weeks to dose a difference on Zoloft however we are working towards tapering off benzos.

## 2016-10-19 NOTE — Assessment & Plan Note (Signed)
Chronic low back pain with radiation to right leg and right hip. Had injection 2 months ago with interventional radiology has had several in the past with substantial improvement of her pain. She requests another referral for another injection.  - for her to IR for another injection -Encouraged heat and sparing NSAID use

## 2016-10-19 NOTE — Assessment & Plan Note (Addendum)
She has baseline anxiety disorder, located by acute stressors including her son's recent death and her inability to visit before him passing. She reports she is very sad and crying all the time is having difficulty going on with her daily activities. There is been some confusion over if she is actually taking Zoloft or not liver today clarified that she's been off of Zoloft for over 6 months. States she followed with [facility] for one month however has not come back. She adamantly requests continuing Klonopin she finds substantial relief of her anxiety with this. She was lectured that this is not a long-term medication that I would be providing for her and that she needs to follow up with psychiatry. -Start Zoloft 50 mg daily. Patient reportedly was on 200 mg daily at one time however do not see evidence of this from chart review -Reviewed the [place] controlled substance database. Last prescription was Klonopin refilled 09/15/16. All prescriptions have been given from [facility] internal [facility]. -Prescription given for Klonopin 1 mg twice a day when necessary severe anxiety #50 refill 0. This is a 30 day supply. Instructed to use this sparingly as possible -Patient instructed to establish with [facility] or other psychiatry. Patient would greatly benefit from having a primary psychiatrist and counselor

## 2016-10-20 NOTE — Progress Notes (Signed)
Internal Medicine Clinic Attending  Case discussed with Dr. Molt at the time of the visit.  We reviewed the resident's history and exam and pertinent patient test results.  I agree with the assessment, diagnosis, and plan of care documented in the resident's note. 

## 2016-11-02 MED FILL — clonazePAM 1 MG TABS: 1 | 25 days supply | Qty: 50 | Fill #0

## 2016-11-10 ENCOUNTER — Ambulatory Visit
Admission: RE | Admit: 2016-11-10 | Discharge: 2016-11-10 | Disposition: A | Discharge status: Home / Self Care | Payer: No Typology Code available for payment source | Source: Ambulatory Visit | Attending: Internal Medicine | Admitting: Internal Medicine

## 2016-11-10 DIAGNOSIS — M5431 Sciatica, right side: Secondary | ICD-10-CM

## 2016-11-10 MED ORDER — METHYLPREDNISOLONE ACETATE 40 MG/ML INJ SUSP (RADIOLOG
120.0000 mg | Freq: Once | INTRAMUSCULAR | Status: AC
  Administered 2016-11-10: 120 mg via EPIDURAL

## 2016-11-10 MED ORDER — IOPAMIDOL (ISOVUE-M 200) INJECTION 41%
1.0000 mL | Freq: Once | INTRAMUSCULAR | Status: AC
  Administered 2016-11-10: 1 mL via EPIDURAL

## 2016-11-10 NOTE — Discharge Instructions (Signed)
Inyeccin epidural de corticoides (Epidural Steroid Injection) Una inyeccin epidural de corticoides es una inyeccin de un corticoide y un medicamento anestsico que se aplica en el espacio entre la mdula espinal y los huesos de la espalda (espacio epidural). La inyeccin ayuda a Best boy causado por la irritacin o la inflamacin de una raz nerviosa. [place] de alivio del dolor que brinda la inyeccin depende de qu causa la inflamacin y la irritacin del nervio, y de la duracin del Social research officer, government. Es ms probable que esta inyeccin le aporte un beneficio si el dolor que padece es fuerte y aparece de repente, ms que si lo ha tenido [employer]. INFORME A SU MDICO:  Cualquier alergia que tenga.  Todos los [employer], incluidos vitaminas, hierbas, gotas oftlmicas, cremas y medicamentos de venta libre.  Cualquier problema que usted o sus familiares hayan tenido con la anestesia.  Enfermedades de la sangre que tenga.  Cirugas previas.  Cualquier enfermedad que tenga.  Si est embarazada o podra estarlo. RIESGOS Y COMPLICACIONES En general, se trata de un procedimiento seguro. Sin embargo, pueden presentarse problemas, por ejemplo:  Dolor de [place].  Una hemorragia.  Una infeccin.  Reaccin alrgica a un medicamento.  Dao a los nervios. ANTES DEL PROCEDIMIENTO Mantenerse hidratado Siga las indicaciones del mdico acerca de la hidratacin, las cuales pueden incluir lo siguiente:  Hasta 2horas antes del procedimiento, puede beber lquidos transparentes, como agua, jugos frutales transparentes, caf negro y t solo. Restricciones en las comidas y bebidas [place] comidas y las bebidas, las cuales pueden incluir lo siguiente:  Ocho horas antes del procedimiento, deje de ingerir comidas o alimentos pesados, por ejemplo, carne, alimentos fritos o alimentos grasos.  Seis horas antes del procedimiento, deje de  ingerir comidas o alimentos livianos, como tostadas o cereales.  Seis horas antes del procedimiento, deje de beber [place] o bebidas que [employer].  Dos horas antes del procedimiento, deje de beber lquidos transparentes. Medicamentos  Pueden darle medicamentos para calmar la ansiedad.  Consulte al mdico si debe hacer o no lo siguiente:  Cambiar o suspender los medicamentos que toma habitualmente. Esto es muy importante si toma medicamentos para la diabetes o anticoagulantes.  Tomar medicamentos como aspirina e ibuprofeno. Estos medicamentos pueden tener un efecto anticoagulante en la [place]. No tome estos medicamentos antes del procedimiento si el mdico le indica que no lo haga. Instrucciones generales  Haga planes para que una persona lo lleve a su casa desde el hospital o la clnica. PROCEDIMIENTO  Pueden darle un medicamento para que se relaje (sedante).  Le pedirn que se recueste boca abajo.  Higienizarn el sitio de la inyeccin.  Para adormecer el lugar de la inyeccin, le aplicarn un medicamento anestsico (anestesia local).  Le introducirn una aguja a travs de la piel hasta el espacio epidural. Puede sentir algunas molestias cuando esto ocurre. Se usar un equipo de rayosX para asegurarse de que la aguja se coloque lo ms cerca posible del nervio afectado.  Le inyectarn un corticoide y anestesia local en el espacio epidural.  Se retirar la aguja.  Le colocarn una venda (vendaje) sobre el lugar de la inyeccin. DESPUS DEL PROCEDIMIENTO  Le controlarn la presin arterial, la frecuencia cardaca, la frecuencia respiratoria y Retail buyer de oxgeno en la sangre hasta que haya desaparecido el efecto de los medicamentos administrados.  Puede sentir debilidad o adormecimiento en el brazo o la pierna durante algunas horas.  Puede  sentir Management consultant de la inyeccin.  No conduzca durante 24horas si le administraron un sedante. Esta informacin no tiene  Marine scientist el consejo del mdico. Asegrese de hacerle al mdico cualquier pregunta que tenga. Document Released: 02/07/2013 Document Revised: 09/23/2015 Document Reviewed: 09/23/2015 Elsevier Interactive Patient Education  2017 [employer].

## 2016-12-14 ENCOUNTER — Other Ambulatory Visit: Payer: Self-pay | Admitting: Internal Medicine

## 2016-12-14 DIAGNOSIS — F419 Anxiety disorder, unspecified: Secondary | ICD-10-CM

## 2016-12-16 ENCOUNTER — Other Ambulatory Visit: Payer: Self-pay | Admitting: Internal Medicine

## 2016-12-21 ENCOUNTER — Telehealth: Payer: Self-pay

## 2016-12-21 MED FILL — clonazePAM 1 MG TABS: 1 | 25 days supply | Qty: 50 | Fill #0

## 2016-12-21 NOTE — Telephone Encounter (Addendum)
Clonazepam called into [facility]

## 2017-02-07 ENCOUNTER — Telehealth: Payer: Self-pay | Admitting: Internal Medicine

## 2017-02-07 DIAGNOSIS — F419 Anxiety disorder, unspecified: Secondary | ICD-10-CM

## 2017-02-10 NOTE — Telephone Encounter (Signed)
clonazePAM (KLONOPIN) 1 MG tablet, refill request @ outpatient pharmacy. Pt is completely out of med.

## 2017-02-14 MED FILL — clonazePAM 1 MG TABS: 1 | 30 days supply | Qty: 50 | Fill #0

## 2017-02-14 NOTE — Telephone Encounter (Signed)
Pt is calling back about clonazepam. Please call pt back.

## 2017-02-14 NOTE — Telephone Encounter (Signed)
Called to pharmacy, they will call pt

## 2017-03-31 ENCOUNTER — Other Ambulatory Visit: Payer: Self-pay | Admitting: Internal Medicine

## 2017-03-31 DIAGNOSIS — F419 Anxiety disorder, unspecified: Secondary | ICD-10-CM

## 2017-04-01 NOTE — Telephone Encounter (Signed)
Hello! Have approved [name] Klonopin. Would you mind ensuring this gets called in? Thanks so much!

## 2017-04-04 MED FILL — clonazePAM 1 MG TABS: 1 | 30 days supply | Qty: 50 | Fill #0

## 2017-04-04 NOTE — Telephone Encounter (Signed)
Called to pharmacy 

## 2017-04-14 ENCOUNTER — Ambulatory Visit: Payer: No Typology Code available for payment source

## 2017-05-09 ENCOUNTER — Other Ambulatory Visit: Payer: Self-pay | Admitting: Internal Medicine

## 2017-05-09 DIAGNOSIS — F419 Anxiety disorder, unspecified: Secondary | ICD-10-CM

## 2017-05-11 MED FILL — clonazePAM 1 MG TABS: 1 | 30 days supply | Qty: 50 | Fill #0

## 2017-05-11 NOTE — Telephone Encounter (Signed)
Clonazepam called in to [facility].

## 2017-05-27 ENCOUNTER — Other Ambulatory Visit: Payer: Self-pay

## 2017-05-27 ENCOUNTER — Emergency Department (HOSPITAL_COMMUNITY): Payer: Self-pay

## 2017-05-27 ENCOUNTER — Encounter (HOSPITAL_COMMUNITY): Payer: Self-pay

## 2017-05-27 ENCOUNTER — Emergency Department (HOSPITAL_COMMUNITY)
Admission: EM | Admit: 2017-05-27 | Discharge: 2017-05-28 | Disposition: A | Discharge status: Home / Self Care | Payer: Self-pay | Attending: Emergency Medicine | Admitting: Emergency Medicine

## 2017-05-27 DIAGNOSIS — R0789 Other chest pain: Secondary | ICD-10-CM | POA: Insufficient documentation

## 2017-05-27 DIAGNOSIS — Z79899 Other long term (current) drug therapy: Secondary | ICD-10-CM | POA: Insufficient documentation

## 2017-05-27 DIAGNOSIS — R51 Headache: Secondary | ICD-10-CM | POA: Insufficient documentation

## 2017-05-27 DIAGNOSIS — I10 Essential (primary) hypertension: Secondary | ICD-10-CM | POA: Insufficient documentation

## 2017-05-27 DIAGNOSIS — J45909 Unspecified asthma, uncomplicated: Secondary | ICD-10-CM | POA: Insufficient documentation

## 2017-05-27 LAB — BASIC METABOLIC PANEL
ANION GAP: 9 (ref 5–15)
BUN: 11 mg/dL (ref 6–20)
CALCIUM: 9.5 mg/dL (ref 8.9–10.3)
CO2: 26 mmol/L (ref 22–32)
Chloride: 104 mmol/L (ref 101–111)
Creatinine, Ser: 0.78 mg/dL (ref 0.44–1.00)
GFR calc Af Amer: >60 mL/min (ref >60)
GLUCOSE: 96 mg/dL (ref 65–99)
POTASSIUM: 3.8 mmol/L (ref 3.5–5.1)
SODIUM: 139 mmol/L (ref 135–145)

## 2017-05-27 LAB — CBC
HCT: 39.6 % (ref 36.0–46.0)
Hemoglobin: 13.2 g/dL (ref 12.0–15.0)
MCH: 30.6 pg (ref 26.0–34.0)
MCHC: 33.3 g/dL (ref 30.0–36.0)
MCV: 91.7 fL (ref 78.0–100.0)
Platelets: 299 10*3/uL (ref 150–400)
RBC: 4.32 MIL/uL (ref 3.87–5.11)
RDW: 13.2 % (ref 11.5–15.5)
WBC: 8.1 10*3/uL (ref 4.0–10.5)

## 2017-05-27 LAB — I-STAT TROPONIN, ED
TROPONIN I, POC: 0 ng/mL (ref 0.00–0.08)
Troponin i, poc: 0 ng/mL (ref 0.00–0.08)

## 2017-05-27 LAB — I-STAT BETA HCG BLOOD, ED (MC, WL, AP ONLY): HCG, QUANTITATIVE: 8.1 m[IU]/mL — AB (ref <5)

## 2017-05-27 MED ORDER — KETOROLAC TROMETHAMINE 30 MG/ML IJ SOLN
30.0000 mg | Freq: Once | INTRAMUSCULAR | Status: AC
  Administered 2017-05-27: 30 mg via INTRAVENOUS
  Filled 2017-05-27: qty 1

## 2017-05-27 MED ORDER — METOCLOPRAMIDE HCL 5 MG/ML IJ SOLN
10.0000 mg | Freq: Once | INTRAMUSCULAR | Status: AC
  Administered 2017-05-27: 10 mg via INTRAVENOUS
  Filled 2017-05-27: qty 2

## 2017-05-27 MED ORDER — TRAMADOL HCL 50 MG PO TABS: 50.0000 mg | ORAL_TABLET | Freq: Four times a day (QID) | ORAL | 0 refills | Status: DC | PRN

## 2017-05-27 MED ORDER — DIPHENHYDRAMINE HCL 50 MG/ML IJ SOLN
25.0000 mg | Freq: Once | INTRAMUSCULAR | Status: AC
  Administered 2017-05-27: 25 mg via INTRAVENOUS
  Filled 2017-05-27: qty 1

## 2017-05-27 MED ORDER — LORAZEPAM 2 MG/ML IJ SOLN: 1.0000 mg | Freq: Once | INTRAMUSCULAR | Status: DC

## 2017-05-27 NOTE — Discharge Instructions (Signed)
Follow-up with your family doctor for recheck in the next 1-2 weeks.
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
## 2017-05-27 NOTE — ED Notes (Signed)
Patient transported to CT 

## 2017-05-27 NOTE — ED Notes (Signed)
Pt remains in waiting room. Updated on wait for treatment room. 

## 2017-05-27 NOTE — ED Triage Notes (Signed)
Per Pt, Pt is coming from home with Left-Sided Chest pain, headache x 2 days with nausea that started today. Reports vomiting this morning. Complains of some dizziness and lightheadedness.

## 2017-05-27 NOTE — ED Provider Notes (Signed)
12:00 AM  Assumed care from Dr. Roderic Palau.  Patient is a 51 year old female who presented to the emergency department with chest pain and headache.  Headache improved after migraine cocktail.  She has had normal EKG and 2 negative troponins.  Head CT obtained that showed artifact versus small high attenuating focus in the right medulla.  Recommended an MRI for further evaluation.  MRI pending for disposition.   3:15 AM  Pt's brain MRI was normal.  Area on CT likely due to artifact.  I feel she is safe for discharge home.   At this time, I do not feel there is any life-threatening condition present. I have reviewed and discussed all results (EKG, imaging, lab, urine as appropriate) and exam findings with patient/family. I have reviewed nursing notes and appropriate previous records.  I feel the patient is safe to be discharged home without further emergent workup and can continue workup as an outpatient as needed. Discussed usual and customary return precautions. Patient/family verbalize understanding and are comfortable with this plan.  Outpatient follow-up has been provided if needed. All questions have been answered.    Ellisa Devivo, Delice Bison, DO 05/28/17 [phone]

## 2017-05-27 NOTE — ED Notes (Signed)
ED Provider at bedside. 

## 2017-05-28 ENCOUNTER — Emergency Department (HOSPITAL_COMMUNITY): Payer: Self-pay

## 2017-05-28 NOTE — ED Notes (Signed)
Patient transported to MRI 

## 2017-05-28 NOTE — ED Notes (Signed)
Pt departed in NAD, refused use of wheelchair.  

## 2017-06-07 ENCOUNTER — Encounter: Payer: No Typology Code available for payment source | Admitting: Internal Medicine

## 2017-07-06 ENCOUNTER — Encounter: Payer: Self-pay | Admitting: Internal Medicine

## 2017-07-06 ENCOUNTER — Other Ambulatory Visit: Payer: Self-pay

## 2017-07-06 ENCOUNTER — Ambulatory Visit: Payer: Self-pay | Admitting: Internal Medicine

## 2017-07-06 VITALS — BP 123/53 | HR 69 | Temp 98.2°F | Ht 61.0 in | Wt 123.3 lb

## 2017-07-06 DIAGNOSIS — F329 Major depressive disorder, single episode, unspecified: Secondary | ICD-10-CM

## 2017-07-06 DIAGNOSIS — M62838 Other muscle spasm: Secondary | ICD-10-CM

## 2017-07-06 DIAGNOSIS — F419 Anxiety disorder, unspecified: Secondary | ICD-10-CM

## 2017-07-06 DIAGNOSIS — Z8619 Personal history of other infectious and parasitic diseases: Secondary | ICD-10-CM

## 2017-07-06 DIAGNOSIS — I10 Essential (primary) hypertension: Secondary | ICD-10-CM

## 2017-07-06 DIAGNOSIS — Z90710 Acquired absence of both cervix and uterus: Secondary | ICD-10-CM

## 2017-07-06 DIAGNOSIS — Z8669 Personal history of other diseases of the nervous system and sense organs: Secondary | ICD-10-CM

## 2017-07-06 DIAGNOSIS — Z23 Encounter for immunization: Secondary | ICD-10-CM

## 2017-07-06 DIAGNOSIS — Z0000 Encounter for general adult medical examination without abnormal findings: Secondary | ICD-10-CM

## 2017-07-06 DIAGNOSIS — N898 Other specified noninflammatory disorders of vagina: Secondary | ICD-10-CM

## 2017-07-06 MED ORDER — SERTRALINE HCL 50 MG PO TABS: 100.0000 mg | ORAL_TABLET | Freq: Every day | ORAL | 2 refills | Status: DC

## 2017-07-06 MED ORDER — CYCLOBENZAPRINE HCL 5 MG PO TABS
5.0000 mg | ORAL_TABLET | Freq: Three times a day (TID) | ORAL | 0 refills | Status: DC | PRN
Start: 2017-07-06 — End: 2018-01-19

## 2017-07-06 MED ORDER — CLONAZEPAM 1 MG PO TABS: ORAL_TABLET | ORAL | 0 refills | Status: DC

## 2017-07-06 MED FILL — CYCLOBENZAPRINE 5 MG TABLET: 5 | 10 days supply | Qty: 30 | Fill #0

## 2017-07-06 MED FILL — clonazePAM 1 MG TABS: 1 | 37 days supply | Qty: 75 | Fill #0

## 2017-07-06 MED FILL — SERTRALINE HCL 50 MG TABLET: 50 | 15 days supply | Qty: 30 | Fill #0
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
## 2017-07-06 NOTE — Patient Instructions (Signed)
Me alegro de haberte visto hoy. Lamento mucho lo de tu prdida. Creo que podra ser una buena idea hablar con alguien. [facility] ofrece servicios gratuitos de salud mental. Estoy aumentando tu cantidad de Klonopin a medida que pasas por este perodo agudo. Tambin estoy aumentando tu Zoloft tambin. He enviado una receta para un relajante muscular. Le har saber si necesita antibiticos

## 2017-07-07 ENCOUNTER — Encounter: Payer: Self-pay | Admitting: Internal Medicine

## 2017-07-07 DIAGNOSIS — Z23 Encounter for immunization: Secondary | ICD-10-CM | POA: Insufficient documentation

## 2017-07-07 LAB — HIV ANTIBODY (ROUTINE TESTING W REFLEX): HIV SCREEN 4TH GENERATION: NONREACTIVE

## 2017-07-07 LAB — CERVICOVAGINAL ANCILLARY ONLY
Bacterial vaginitis: NEGATIVE
CHLAMYDIA, DNA PROBE: NEGATIVE
Candida vaginitis: NEGATIVE
NEISSERIA GONORRHEA: NEGATIVE
Trichomonas: NEGATIVE

## 2017-07-07 NOTE — Assessment & Plan Note (Signed)
Patient typically on Klonopin 1 mg BID PRN and usually recieves 50/month. Will increase her dose temporarily during the next few months during her acute grief. Will also increase her dose of Zoloft to 100mg . She is not suicidal or homicidal but is distraught that her grandchildren are now without both parents. I provided her information for crisis lines and also encouraged her to walk-in to [facility] should she need counseling.  -Klonopin 1mg , #75, no refill -Increase Zoloft to 100mg  -Patient to RTC 1 month for check-in

## 2017-07-07 NOTE — Assessment & Plan Note (Signed)
1 week history of neck and shoulder pain and stiffness. Denied any overuse or trauma. Tylenol helps relieve pain but tension persists. Suspect related to patients significant life stressors at the moment.  -Short course of Flexeril as needed for spasm -To continue Tylenol PRN

## 2017-07-07 NOTE — Assessment & Plan Note (Addendum)
Patient mentions 1 month history of white/clear vaginal discharge with slight foul odor. Notes a similar episode when she had chlamydia in the past. She is sexually active with 1 partner and has been for years. She also requested a PAP as well. I see that she is s/p hysterectomy however cervix present on exam with some slight erythema.  -PAP performed -Culture sent -Chlamydia/gonorrhea/wet prep -Will also check HIV

## 2017-07-07 NOTE — Assessment & Plan Note (Signed)
Received flu shot today. Tolerated well.

## 2017-07-08 LAB — CYTOLOGY - PAP
ADEQUACY: ABSENT
DIAGNOSIS: NEGATIVE
HPV (WINDOPATH): NOT DETECTED

## 2017-07-12 NOTE — Progress Notes (Signed)
Internal Medicine Clinic Attending  Case discussed with Dr. Molt at the time of the visit.  We reviewed the resident's history and exam and pertinent patient test results.  I agree with the assessment, diagnosis, and plan of care documented in the resident's note. 

## 2017-07-12 NOTE — Addendum Note (Signed)
Addended by: Gilles Chiquito B on: 07/12/2017 03:20 PM   Modules accepted: Level of Service

## 2017-08-09 ENCOUNTER — Encounter (INDEPENDENT_AMBULATORY_CARE_PROVIDER_SITE_OTHER): Payer: Self-pay

## 2017-08-09 ENCOUNTER — Ambulatory Visit: Payer: Self-pay | Admitting: Internal Medicine

## 2017-08-09 ENCOUNTER — Other Ambulatory Visit: Payer: Self-pay

## 2017-08-09 ENCOUNTER — Encounter: Payer: Self-pay | Admitting: Internal Medicine

## 2017-08-09 DIAGNOSIS — M7702 Medial epicondylitis, left elbow: Secondary | ICD-10-CM

## 2017-08-09 DIAGNOSIS — M7700 Medial epicondylitis, unspecified elbow: Secondary | ICD-10-CM | POA: Insufficient documentation

## 2017-08-09 DIAGNOSIS — F419 Anxiety disorder, unspecified: Secondary | ICD-10-CM

## 2017-08-09 DIAGNOSIS — F4321 Adjustment disorder with depressed mood: Secondary | ICD-10-CM

## 2017-08-09 DIAGNOSIS — M7701 Medial epicondylitis, right elbow: Secondary | ICD-10-CM

## 2017-08-09 DIAGNOSIS — Z79899 Other long term (current) drug therapy: Secondary | ICD-10-CM

## 2017-08-09 MED ORDER — CLONAZEPAM 1 MG PO TABS: ORAL_TABLET | ORAL | 0 refills | Status: DC

## 2017-08-09 NOTE — Assessment & Plan Note (Signed)
Patient with 1 month history of left medial elbow pain which started at work. 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 Examination is without increased warmth, swelling, erythema or decreased ROM. She has point tenderness at medial elbow which she tells me is "horrible" but was pressing away on it during our evaluation without issue. She asked for pain medicine injection however I do not feel this is appropriate. Will treat conservatively. Patient working on orange card.  -Conservative management with stretches, tylenol, naproxen, heat and topical trolamine as needed

## 2017-08-09 NOTE — Assessment & Plan Note (Signed)
Patient doing better on increased Zoloft and Klonopin prescribed last month for acute grief. We discussed continuing Zoloft at current dose but slowly tapering down Klonopin back to her previous dose, and eventually off.  -Rx sent for Klonopin 1mg  TID PRN #55.
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
## 2017-08-09 NOTE — Patient Instructions (Signed)
FOLLOW-UP INSTRUCTIONS When: 1 month For: Depression/Klonopin taper What to bring: Nothing

## 2017-08-12 NOTE — Progress Notes (Signed)
Internal Medicine Clinic Attending  Case discussed with Dr. Molt at the time of the visit.  We reviewed the resident's history and exam and pertinent patient test results.  I agree with the assessment, diagnosis, and plan of care documented in the resident's note. 

## 2017-09-13 ENCOUNTER — Other Ambulatory Visit: Payer: Self-pay | Admitting: Internal Medicine

## 2017-09-13 ENCOUNTER — Telehealth: Payer: Self-pay | Admitting: Internal Medicine

## 2017-09-13 DIAGNOSIS — F419 Anxiety disorder, unspecified: Secondary | ICD-10-CM

## 2017-09-13 MED ORDER — CLONAZEPAM 1 MG PO TABS: ORAL_TABLET | ORAL | 0 refills | Status: DC

## 2017-09-13 MED FILL — clonazePAM 1 MG TABS: 1 | 30 days supply | Qty: 55 | Fill #0

## 2017-09-13 NOTE — Telephone Encounter (Signed)
Patient is requesting refills clonazepam, [facility] outpatient pharmacy

## 2017-09-13 NOTE — Telephone Encounter (Signed)
Pls call pharmacy, there is an issue with the supply order of pills

## 2017-09-14 NOTE — Telephone Encounter (Signed)
Have addressed

## 2017-09-14 NOTE — Telephone Encounter (Signed)
Pharmacy calls for clarification of script for clonazepam, should it be #45 or #55, gave them dr molt's pager # and they will speak with her

## 2017-10-12 ENCOUNTER — Ambulatory Visit: Payer: Self-pay

## 2017-10-20 ENCOUNTER — Ambulatory Visit: Payer: Self-pay

## 2017-10-24 ENCOUNTER — Other Ambulatory Visit: Payer: Self-pay | Admitting: Internal Medicine

## 2017-10-24 DIAGNOSIS — F419 Anxiety disorder, unspecified: Secondary | ICD-10-CM

## 2017-10-24 NOTE — Telephone Encounter (Signed)
I spoke with Karen [name] about this. Patient has a prescription written on March 26th 2019 for 45 pills that was supposed to have gone to [facility] outpatient pharmacy. The pharmacy did fill a prescription for the patient on March 26th using a February prescription for 55 pills. The pharmacy is supposed to search it's records and get back to [place] to see if we need to send another one or if they have one on file.

## 2017-10-26 MED FILL — clonazePAM 1 MG TABS: 1 | 30 days supply | Qty: 55 | Fill #0

## 2017-11-23 ENCOUNTER — Other Ambulatory Visit: Payer: Self-pay

## 2017-11-23 ENCOUNTER — Ambulatory Visit (INDEPENDENT_AMBULATORY_CARE_PROVIDER_SITE_OTHER): Payer: Self-pay | Admitting: Internal Medicine

## 2017-11-23 ENCOUNTER — Encounter: Payer: Self-pay | Admitting: Internal Medicine

## 2017-11-23 VITALS — BP 113/69 | HR 50 | Temp 97.9°F | Ht 61.0 in | Wt 122.5 lb

## 2017-11-23 DIAGNOSIS — R1033 Periumbilical pain: Secondary | ICD-10-CM

## 2017-11-23 DIAGNOSIS — R1912 Hyperactive bowel sounds: Secondary | ICD-10-CM

## 2017-11-23 DIAGNOSIS — R110 Nausea: Secondary | ICD-10-CM

## 2017-11-23 DIAGNOSIS — K589 Irritable bowel syndrome without diarrhea: Secondary | ICD-10-CM

## 2017-11-23 DIAGNOSIS — G43909 Migraine, unspecified, not intractable, without status migrainosus: Secondary | ICD-10-CM

## 2017-11-23 DIAGNOSIS — R350 Frequency of micturition: Secondary | ICD-10-CM

## 2017-11-23 DIAGNOSIS — R1012 Left upper quadrant pain: Secondary | ICD-10-CM

## 2017-11-23 DIAGNOSIS — Z8711 Personal history of peptic ulcer disease: Secondary | ICD-10-CM

## 2017-11-23 DIAGNOSIS — R1013 Epigastric pain: Secondary | ICD-10-CM

## 2017-11-23 DIAGNOSIS — Z8719 Personal history of other diseases of the digestive system: Secondary | ICD-10-CM

## 2017-11-23 DIAGNOSIS — F329 Major depressive disorder, single episode, unspecified: Secondary | ICD-10-CM

## 2017-11-23 DIAGNOSIS — G8929 Other chronic pain: Secondary | ICD-10-CM

## 2017-11-23 DIAGNOSIS — M5440 Lumbago with sciatica, unspecified side: Secondary | ICD-10-CM

## 2017-11-23 DIAGNOSIS — Z634 Disappearance and death of family member: Secondary | ICD-10-CM

## 2017-11-23 DIAGNOSIS — R1010 Upper abdominal pain, unspecified: Secondary | ICD-10-CM

## 2017-11-23 DIAGNOSIS — R1011 Right upper quadrant pain: Secondary | ICD-10-CM

## 2017-11-23 DIAGNOSIS — M5431 Sciatica, right side: Secondary | ICD-10-CM

## 2017-11-23 DIAGNOSIS — F419 Anxiety disorder, unspecified: Secondary | ICD-10-CM
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
## 2017-11-23 NOTE — Patient Instructions (Addendum)
Fue bueno verte hoy, lamento que no te sientas lo mejor posible. No estoy seguro de qu es exactamente lo que est causando su dolor, pero me pregunto si podra tener una infeccin del tracto urinario. He analizado su orina para Psychologist, clinical infeccin y lo llamar maana con los [place].  He colocado la referencia para la inyeccin. Te llamarn con una cita.  Pngase en contacto con la clnica o dirjase al departamento de emergencias si sus sntomas empeoran.

## 2017-11-24 ENCOUNTER — Encounter: Payer: Self-pay | Admitting: Internal Medicine

## 2017-11-24 DIAGNOSIS — R350 Frequency of micturition: Secondary | ICD-10-CM | POA: Insufficient documentation

## 2017-11-24 LAB — URINALYSIS, ROUTINE W REFLEX MICROSCOPIC
Bilirubin, UA: NEGATIVE
Glucose, UA: NEGATIVE
Ketones, UA: NEGATIVE
Leukocytes, UA: NEGATIVE
Nitrite, UA: NEGATIVE
Protein, UA: NEGATIVE
RBC, UA: NEGATIVE
Specific Gravity, UA: >=1.03 — AB (ref 1.005–1.030)
Urobilinogen, Ur: 0.2 mg/dL (ref 0.2–1.0)
pH, UA: 5 (ref 5.0–7.5)

## 2017-11-24 NOTE — Assessment & Plan Note (Addendum)
Assessment & Plan: Pt with chronic LBP and sciatica which responded well to injection with IR months ago. Requests referral for another injection. Order placed for IR.

## 2017-11-24 NOTE — Assessment & Plan Note (Addendum)
Assessment: Patient here noting a 1-week history of persistent nausea with abdominal pain. Indicates abdominal pain is most pronounced in BL upper quadrants, but does have some in the lower abdomen. No vomiting, diarrhea or changes in PO intake. Nausea and pain not related to PO intake. Admits to history of prior episodes which resolved spontaneously. Exam with some RUQ TTP and suprapubic TTP as well. POCUS without obvious pathology and no obvious gallstones were appreciated. She has tried tums and alka-selzter at home without improvement. Chart review shows patient with history of IBS and many ED presentations with non-specific nausea and abdominal pain without obvious cause.   Plan: Symptoms are non-specific. No obvious gallstones on POCUS and symptoms do not sound consistent with biliary colic given no correlation with PO intake. UA negative for infection. She does have history of IBS and anxiety who is currently undergoing some increased stress at home due to several family deaths. Recommended supportive care for now and encouraged brief trial of PPI to see if helpful given her history of gastritis and PUD. If patients symptoms continue, or sound more convincing for biliary colic could obtain formal RUQ [place].

## 2017-11-24 NOTE — Assessment & Plan Note (Signed)
Assessment & Plan: Pt with suprapubic tenderness. Endorses increased urinary frequency but denied dysuria or hematuria. UA without infection. No abx indicated at this time.

## 2017-11-25 NOTE — Addendum Note (Signed)
Addended by: Lalla Brothers T on: 11/25/2017 09:32 AM   Modules accepted: Level of Service

## 2017-11-25 NOTE — Progress Notes (Signed)
Internal Medicine Clinic Attending  I saw and evaluated the patient.  I personally confirmed the key portions of the history and exam documented by Dr. Danford Bad and I reviewed pertinent patient test results.  The assessment, diagnosis, and plan were formulated together and I agree with the documentation in the resident's note.  As a correction, plan is to rule out choledocholithiasis with RUQ [place] now. Patient has at least intermediate risk for this based on age, gender, and description of the pain.

## 2017-11-26 NOTE — Addendum Note (Signed)
Addended by: Tamsen Roers on: 11/26/2017 05:01 PM   Modules accepted: Orders

## 2017-11-29 ENCOUNTER — Other Ambulatory Visit: Payer: Self-pay | Admitting: Internal Medicine

## 2017-11-29 DIAGNOSIS — M5431 Sciatica, right side: Secondary | ICD-10-CM

## 2017-12-08 MED FILL — clonazePAM 1 MG TABS: 1 | 30 days supply | Qty: 45 | Fill #0

## 2017-12-14 ENCOUNTER — Ambulatory Visit
Admission: RE | Admit: 2017-12-14 | Discharge: 2017-12-14 | Disposition: A | Discharge status: Home / Self Care | Payer: No Typology Code available for payment source | Source: Ambulatory Visit | Attending: Internal Medicine | Admitting: Internal Medicine

## 2017-12-14 DIAGNOSIS — M5431 Sciatica, right side: Secondary | ICD-10-CM

## 2017-12-14 MED ORDER — IOPAMIDOL (ISOVUE-M 200) INJECTION 41%
1.0000 mL | Freq: Once | INTRAMUSCULAR | Status: AC
  Administered 2017-12-14: 1 mL via EPIDURAL

## 2017-12-14 MED ORDER — METHYLPREDNISOLONE ACETATE 40 MG/ML INJ SUSP (RADIOLOG
120.0000 mg | Freq: Once | INTRAMUSCULAR | Status: AC
  Administered 2017-12-14: 120 mg via EPIDURAL

## 2017-12-14 NOTE — Discharge Instructions (Signed)

## 2017-12-26 ENCOUNTER — Ambulatory Visit (HOSPITAL_COMMUNITY): Admission: RE | Admit: 2017-12-26 | Payer: No Typology Code available for payment source | Source: Ambulatory Visit

## 2018-01-12 ENCOUNTER — Other Ambulatory Visit: Payer: Self-pay | Admitting: Internal Medicine

## 2018-01-13 MED FILL — clonazePAM 1 MG TABS: 1 | 30 days supply | Qty: 45 | Fill #0

## 2018-01-19 ENCOUNTER — Encounter (HOSPITAL_COMMUNITY): Payer: Self-pay

## 2018-01-19 ENCOUNTER — Other Ambulatory Visit: Payer: Self-pay

## 2018-01-19 ENCOUNTER — Ambulatory Visit (HOSPITAL_COMMUNITY)
Admission: EM | Admit: 2018-01-19 | Discharge: 2018-01-19 | Disposition: A | Discharge status: Home / Self Care | Payer: No Typology Code available for payment source | Attending: Family Medicine | Admitting: Family Medicine

## 2018-01-19 DIAGNOSIS — L309 Dermatitis, unspecified: Secondary | ICD-10-CM

## 2018-01-19 DIAGNOSIS — G43009 Migraine without aura, not intractable, without status migrainosus: Secondary | ICD-10-CM

## 2018-01-19 MED ORDER — DEXAMETHASONE SODIUM PHOSPHATE 10 MG/ML IJ SOLN
10.0000 mg | Freq: Once | INTRAMUSCULAR | Status: AC
  Administered 2018-01-19: 10 mg via INTRAMUSCULAR

## 2018-01-19 MED ORDER — KETOROLAC TROMETHAMINE 60 MG/2ML IM SOLN
INTRAMUSCULAR | Status: AC
  Filled 2018-01-19: qty 2

## 2018-01-19 MED ORDER — ONDANSETRON 4 MG PO TBDP
4.0000 mg | ORAL_TABLET | Freq: Once | ORAL | Status: AC
  Administered 2018-01-19: 4 mg via ORAL

## 2018-01-19 MED ORDER — ONDANSETRON 4 MG PO TBDP
ORAL_TABLET | ORAL | Status: AC
  Filled 2018-01-19: qty 1

## 2018-01-19 MED ORDER — DEXAMETHASONE SODIUM PHOSPHATE 10 MG/ML IJ SOLN
INTRAMUSCULAR | Status: AC
Start: 2018-01-19 — End: ?
  Filled 2018-01-19: qty 1

## 2018-01-19 MED ORDER — KETOROLAC TROMETHAMINE 60 MG/2ML IM SOLN
60.0000 mg | Freq: Once | INTRAMUSCULAR | Status: AC
  Administered 2018-01-19: 60 mg via INTRAMUSCULAR

## 2018-01-19 NOTE — ED Triage Notes (Signed)
Rash and headache 2 days

## 2018-01-19 NOTE — Discharge Instructions (Signed)
Follow up with your primary care doctor Return for severe pain Take benadryl if the rash itches
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
## 2018-02-28 ENCOUNTER — Emergency Department (HOSPITAL_COMMUNITY)
Admission: EM | Admit: 2018-02-28 | Discharge: 2018-03-01 | Disposition: A | Discharge status: Home / Self Care | Payer: No Typology Code available for payment source | Attending: Emergency Medicine | Admitting: Emergency Medicine

## 2018-02-28 ENCOUNTER — Emergency Department (HOSPITAL_COMMUNITY): Payer: No Typology Code available for payment source

## 2018-02-28 ENCOUNTER — Other Ambulatory Visit: Payer: Self-pay

## 2018-02-28 ENCOUNTER — Encounter (HOSPITAL_COMMUNITY): Payer: Self-pay | Admitting: Emergency Medicine

## 2018-02-28 DIAGNOSIS — I10 Essential (primary) hypertension: Secondary | ICD-10-CM | POA: Insufficient documentation

## 2018-02-28 DIAGNOSIS — Z79899 Other long term (current) drug therapy: Secondary | ICD-10-CM | POA: Insufficient documentation

## 2018-02-28 DIAGNOSIS — R1013 Epigastric pain: Secondary | ICD-10-CM | POA: Insufficient documentation

## 2018-02-28 DIAGNOSIS — J45909 Unspecified asthma, uncomplicated: Secondary | ICD-10-CM | POA: Insufficient documentation

## 2018-02-28 DIAGNOSIS — R0789 Other chest pain: Secondary | ICD-10-CM

## 2018-02-28 LAB — CBC
HEMATOCRIT: 36.2 % (ref 36.0–46.0)
Hemoglobin: 11.9 g/dL — ABNORMAL LOW (ref 12.0–15.0)
MCH: 30.8 pg (ref 26.0–34.0)
MCHC: 32.9 g/dL (ref 30.0–36.0)
MCV: 93.8 fL (ref 78.0–100.0)
PLATELETS: 284 10*3/uL (ref 150–400)
RBC: 3.86 MIL/uL — ABNORMAL LOW (ref 3.87–5.11)
RDW: 13 % (ref 11.5–15.5)
WBC: 10.3 10*3/uL (ref 4.0–10.5)

## 2018-02-28 LAB — COMPREHENSIVE METABOLIC PANEL
ALBUMIN: 4.1 g/dL (ref 3.5–5.0)
ALK PHOS: 73 U/L (ref 38–126)
ALT: 15 U/L (ref 0–44)
AST: 25 U/L (ref 15–41)
Anion gap: 12 (ref 5–15)
BILIRUBIN TOTAL: 0.5 mg/dL (ref 0.3–1.2)
BUN: 11 mg/dL (ref 6–20)
CALCIUM: 8.9 mg/dL (ref 8.9–10.3)
CO2: 25 mmol/L (ref 22–32)
CREATININE: 0.81 mg/dL (ref 0.44–1.00)
Chloride: 100 mmol/L (ref 98–111)
GFR calc Af Amer: >60 mL/min (ref >60)
GFR calc non Af Amer: >60 mL/min (ref >60)
GLUCOSE: 107 mg/dL — AB (ref 70–99)
POTASSIUM: 3.4 mmol/L — AB (ref 3.5–5.1)
Sodium: 137 mmol/L (ref 135–145)
TOTAL PROTEIN: 6.9 g/dL (ref 6.5–8.1)

## 2018-02-28 LAB — I-STAT TROPONIN, ED: Troponin i, poc: 0.01 ng/mL (ref 0.00–0.08)

## 2018-02-28 LAB — LIPASE, BLOOD: Lipase: 51 U/L (ref 11–51)

## 2018-02-28 LAB — URINALYSIS, ROUTINE W REFLEX MICROSCOPIC
BILIRUBIN URINE: NEGATIVE
Glucose, UA: NEGATIVE mg/dL
Hgb urine dipstick: NEGATIVE
KETONES UR: NEGATIVE mg/dL
Leukocytes, UA: NEGATIVE
NITRITE: NEGATIVE
PH: 7 (ref 5.0–8.0)
Protein, ur: NEGATIVE mg/dL
Specific Gravity, Urine: 1.014 (ref 1.005–1.030)

## 2018-02-28 NOTE — ED Triage Notes (Signed)
Pt reports left chest, lower back and lower abd pain, with N/V/D that started yesterday morning. Pain 10/10. Pt is Spanish speaking.

## 2018-03-01 MED ORDER — ONDANSETRON 4 MG PO TBDP
4.0000 mg | ORAL_TABLET | Freq: Once | ORAL | Status: AC
  Administered 2018-03-01: 4 mg via ORAL
  Filled 2018-03-01: qty 1

## 2018-03-01 MED ORDER — OMEPRAZOLE 20 MG PO CPDR: 20.0000 mg | DELAYED_RELEASE_CAPSULE | Freq: Every day | ORAL | 0 refills | Status: DC

## 2018-03-01 MED ORDER — GI COCKTAIL ~~LOC~~
30.0000 mL | Freq: Once | ORAL | Status: AC
  Administered 2018-03-01: 30 mL via ORAL
  Filled 2018-03-01: qty 30

## 2018-03-01 NOTE — ED Notes (Signed)
Patient Alert and oriented to baseline. Stable and ambulatory to baseline. Patient verbalized understanding of the discharge instructions.  Patient belongings were taken by the patient.   

## 2018-03-01 NOTE — Discharge Instructions (Addendum)
You were seen today for abdominal pain.  This may be related to reflux or peptic ulcer.  Take omeprazole daily.  Follow-up with GI.
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
## 2018-03-13 ENCOUNTER — Other Ambulatory Visit: Payer: Self-pay | Admitting: Internal Medicine

## 2018-03-14 MED FILL — clonazePAM 1 MG TABS: 1 | 30 days supply | Qty: 45 | Fill #0

## 2018-04-10 ENCOUNTER — Encounter: Payer: Self-pay | Admitting: *Deleted

## 2018-04-27 ENCOUNTER — Other Ambulatory Visit: Payer: Self-pay | Admitting: *Deleted

## 2018-05-01 MED ORDER — CLONAZEPAM 1 MG PO TABS: 1.0000 mg | ORAL_TABLET | Freq: Two times a day (BID) | ORAL | 0 refills | Status: DC | PRN

## 2018-05-01 MED FILL — clonazePAM 1 MG TABS: 1 | 20 days supply | Qty: 40 | Fill #0

## 2018-05-12 ENCOUNTER — Ambulatory Visit: Payer: Self-pay | Admitting: Internal Medicine

## 2018-05-12 ENCOUNTER — Other Ambulatory Visit: Payer: Self-pay

## 2018-05-12 ENCOUNTER — Telehealth: Payer: Self-pay | Admitting: Internal Medicine

## 2018-05-12 ENCOUNTER — Encounter (INDEPENDENT_AMBULATORY_CARE_PROVIDER_SITE_OTHER): Payer: Self-pay

## 2018-05-12 ENCOUNTER — Encounter: Payer: Self-pay | Admitting: Internal Medicine

## 2018-05-12 VITALS — BP 118/66 | HR 61 | Ht 62.0 in | Wt 126.8 lb

## 2018-05-12 DIAGNOSIS — M5431 Sciatica, right side: Secondary | ICD-10-CM

## 2018-05-12 DIAGNOSIS — F419 Anxiety disorder, unspecified: Secondary | ICD-10-CM

## 2018-05-12 DIAGNOSIS — Z79899 Other long term (current) drug therapy: Secondary | ICD-10-CM

## 2018-05-12 DIAGNOSIS — Z0000 Encounter for general adult medical examination without abnormal findings: Secondary | ICD-10-CM

## 2018-05-12 DIAGNOSIS — M5432 Sciatica, left side: Secondary | ICD-10-CM

## 2018-05-12 MED ORDER — GABAPENTIN 100 MG PO CAPS: ORAL_CAPSULE | ORAL | 0 refills | Status: DC

## 2018-05-12 MED FILL — GABAPENTIN 100 MG CAPS: 100 | 30 days supply | Qty: 60 | Fill #0

## 2018-05-12 NOTE — Telephone Encounter (Signed)
Pt is at pharmacy, pt medicine isn't there

## 2018-05-12 NOTE — Patient Instructions (Addendum)
Karen [name],   Le envie el medicamento para el dolor a la [employer], mining. este medicamento puede causar sueo y [name]. Tome su primera dosis por la noche.   Por favor, saque una cita para applicar para la tarjeta anaranjada.   saque una cita de seguimiento conmigo en 3 a 6 meses o antes si necesita verme antes.   Llame si tiene alguna pregunta o preocupacion.   - Dra. Frederico Hamman

## 2018-05-12 NOTE — Telephone Encounter (Signed)
Called pharm, pt has gotten med

## 2018-05-15 ENCOUNTER — Encounter: Payer: Self-pay | Admitting: Internal Medicine

## 2018-05-15 NOTE — Assessment & Plan Note (Addendum)
Patient due for screening for colonoscopy, but currently uninsured.  Received flu shot at work last month.
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
## 2018-05-15 NOTE — Progress Notes (Signed)
Internal Medicine Clinic Attending  Case discussed with Dr. Santos-Sanchez at the time of the visit.  We reviewed the resident's history and exam and pertinent patient test results.  I agree with the assessment, diagnosis, and plan of care documented in the resident's note.    

## 2018-05-15 NOTE — Assessment & Plan Note (Signed)
Patient presents complaining of L sciatica pain. She used to follow up with IR for steroid injections with great results. Unfortunately, her [employer] Card expired and she has not been able to follow up with them. States Tylenol and NSAIDs have not helped with the pain which is most bothersome to her at work where she has to be very mobile.  - Short trial of gabapentin 100 mg BID. Patient counseled on side effects.  - Advised to make appt for [facility] application

## 2018-05-15 NOTE — Assessment & Plan Note (Signed)
[name] presents for follow up of anxiety. She is on Klonopin 0.5 mg BID PRN which was recently increased to 1 mg BID after she had a difficult time coping with several deaths in her family. She states she is feeling better today. She has titrated herself down to 0.5 mg daily and states she only takes an extra 1 on difficult days, which occur 1-2x per week. She denies feeling anxious, worried, and irritable at this time.  - Refilled Klonopin 1 mg BID PRN on 05/01/2018 for 40 tablets. Since she is mostly using 0.5 mg daily, this prescription should last her until the end of January 2020. I have updated her medication list to reflect what she is currently taking.  - Continue Zoloft 100 mg QD

## 2018-06-16 ENCOUNTER — Other Ambulatory Visit: Payer: Self-pay | Admitting: Internal Medicine

## 2018-06-22 ENCOUNTER — Other Ambulatory Visit: Payer: Self-pay | Admitting: Internal Medicine

## 2018-06-22 MED ORDER — CLONAZEPAM 0.5 MG PO TABS: 0.5000 mg | ORAL_TABLET | Freq: Two times a day (BID) | ORAL | 0 refills | Status: DC | PRN

## 2018-06-23 MED FILL — clonazePAM 0.5 MG TABS: 0.5 | 15 days supply | Qty: 30 | Fill #0

## 2018-07-24 ENCOUNTER — Telehealth: Payer: Self-pay | Admitting: Internal Medicine

## 2018-07-26 MED FILL — clonazePAM 0.5 MG TABS: 0.5 | 15 days supply | Qty: 30 | Fill #0

## 2018-07-26 NOTE — Telephone Encounter (Signed)
clonazePAM (KLONOPIN) 0.5 MG tablet, REFILL REQUEST @ OUTPATIENT PHARMACY.

## 2018-07-28 ENCOUNTER — Encounter: Payer: Self-pay | Admitting: Internal Medicine

## 2018-07-28 ENCOUNTER — Telehealth: Payer: Self-pay | Admitting: *Deleted

## 2018-07-28 ENCOUNTER — Ambulatory Visit: Payer: BLUE CROSS/BLUE SHIELD | Admitting: Internal Medicine

## 2018-07-28 ENCOUNTER — Other Ambulatory Visit: Payer: Self-pay

## 2018-07-28 VITALS — BP 113/70 | HR 99 | Temp 98.8°F | Ht 62.0 in | Wt 128.1 lb

## 2018-07-28 DIAGNOSIS — M5441 Lumbago with sciatica, right side: Secondary | ICD-10-CM

## 2018-07-28 DIAGNOSIS — G8929 Other chronic pain: Secondary | ICD-10-CM | POA: Diagnosis not present

## 2018-07-28 DIAGNOSIS — M5440 Lumbago with sciatica, unspecified side: Secondary | ICD-10-CM

## 2018-07-28 DIAGNOSIS — M2550 Pain in unspecified joint: Secondary | ICD-10-CM | POA: Diagnosis not present

## 2018-07-28 DIAGNOSIS — M5442 Lumbago with sciatica, left side: Secondary | ICD-10-CM
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
## 2018-07-28 NOTE — Assessment & Plan Note (Addendum)
Ms. [name] presents with a 1 month history of diffuse joint pain that is not associated with any other symptoms. She describes her pain as achiness and states all her joints hurt equally.  She denies similar history in the past.  She has family history of osteoarthritis in her father but no inflammatory arthritides that she knows of.  She is able to perform her ADLs and work, but reports the pain is bothersome on some days.  She denies recent illness, recent contacts, fever, chills, malaise, morning stiffness, dry mouth, joint swelling, recent trauma, and similar symptoms in the past.  She endorses chronic abdominal and lower back pain and constipation but these have not worsened recently and appear to be stable.  She denies changes in sensation as well as weakness.  Able to ambulate without difficulty.  On exam I do not appreciate any signs of infection, septic joint, or active synovitis.  I will order a TSH, sed rate, CBC, and vitamin D for further evaluation.  I suspect there may be a psychosomatic component to patient's symptoms but will need to rule out other etiologies such as hypothyroidism (abdominal pain and constipation), PMR, and vitamin deficiencies.  Will hold off on antibody testing but if sedimentation rate elevated will get ANA, RF and anti-CCP.

## 2018-07-28 NOTE — Patient Instructions (Signed)
Karen [name],   Le mandamos a hacer analisis de sangre para ver si encontramos la cause de su dolor de [place]. La llamaremos el lunes para informarle de los [place].   Tambien la referimos al doctor que iba antes para las inyecciones en su espalda. Va a estar recibiendo [employer] de su oficina para coordinar esta cita.   Haga una cita de seguimiento conmigo en 3 meses o antes si lo necesita.   Llamenos si tiene alguna pregunta o preocupacion.   - Dr. Frederico Hamman

## 2018-07-28 NOTE — Telephone Encounter (Signed)
Call made to [facility] to confirm order received for DG epidural/nerve root-message left on recorder.Despina Hidden Cassady2/7/20204:28 PM

## 2018-07-28 NOTE — Assessment & Plan Note (Addendum)
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  She used to get spinal epidural injections 2 years ago but lost her insurance and has not been able to get these since then.  I recommended gabapentin when I last saw her 3 months ago but this did not work for her.  Other over-the-counter remedies such as Tylenol and ibuprofen have failed to relieve her pain.  Since last seen she has obtained insurance Nurse, mental health) and requests referral for epidural injections.  On exam she does not have any neurological deficits on bilateral lower extremities.  However, given poor pain control with over-the-counter remedies and previous success with epidural injections will refer to IR for epidural injections.

## 2018-07-29 LAB — VITAMIN D 25 HYDROXY (VIT D DEFICIENCY, FRACTURES): Vit D, 25-Hydroxy: 13.1 ng/mL — ABNORMAL LOW (ref 30.0–100.0)

## 2018-07-29 LAB — SEDIMENTATION RATE: SED RATE: 10 mm/h (ref 0–40)

## 2018-07-29 LAB — TSH: TSH: 2.28 u[IU]/mL (ref 0.450–4.500)

## 2018-07-31 NOTE — Telephone Encounter (Signed)
Thank you :)

## 2018-07-31 NOTE — Progress Notes (Signed)
Internal Medicine Clinic Attending  Case discussed with Dr. Santos-Sanchez at the time of the visit.  We reviewed the resident's history and exam and pertinent patient test results.  I agree with the assessment, diagnosis, and plan of care documented in the resident's note.    

## 2018-07-31 NOTE — Telephone Encounter (Signed)
Received call from [facility] that pt has been scheduled for epidural injection and is aware of appt.  No further action needed at this time-phone call complete.Despina Hidden Cassady2/10/202010:48 AM

## 2018-08-08 ENCOUNTER — Ambulatory Visit
Admission: RE | Admit: 2018-08-08 | Discharge: 2018-08-08 | Disposition: A | Discharge status: Home / Self Care | Payer: BLUE CROSS/BLUE SHIELD | Source: Ambulatory Visit | Attending: Internal Medicine | Admitting: Internal Medicine

## 2018-08-08 DIAGNOSIS — G8929 Other chronic pain: Secondary | ICD-10-CM

## 2018-08-08 DIAGNOSIS — M5440 Lumbago with sciatica, unspecified side: Principal | ICD-10-CM

## 2018-08-08 MED ORDER — METHYLPREDNISOLONE ACETATE 40 MG/ML INJ SUSP (RADIOLOG
120.0000 mg | Freq: Once | INTRAMUSCULAR | Status: AC
  Administered 2018-08-08: 120 mg via EPIDURAL

## 2018-08-08 MED ORDER — IOPAMIDOL (ISOVUE-M 200) INJECTION 41%
1.0000 mL | Freq: Once | INTRAMUSCULAR | Status: AC
  Administered 2018-08-08: 1 mL via EPIDURAL

## 2018-08-17 ENCOUNTER — Other Ambulatory Visit: Payer: Self-pay | Admitting: Internal Medicine

## 2018-08-17 MED ORDER — CLONAZEPAM 0.5 MG PO TABS: 0.5000 mg | ORAL_TABLET | Freq: Two times a day (BID) | ORAL | 0 refills | Status: DC | PRN

## 2018-08-17 NOTE — Telephone Encounter (Signed)
Called pt - states she's going out of the country next Thursday. Needs refill on Clonazepam and a letter stating she needs her medications. Thanks

## 2018-08-17 NOTE — Telephone Encounter (Signed)
Pt is calling back regarding clonazePAM 0.5mg  @ outpatient pharmacy, pls call pt is is leaving to go back to country to visit; pt contact [phone]

## 2018-08-17 NOTE — Telephone Encounter (Signed)
Sent prescription for clonazepam to pharmacy. She should be able to pick it up on or after 3/3 which is when she is due for a refill. I left the letter at the front desk. She can pick it up whenever is convenient for her.

## 2018-08-18 NOTE — Telephone Encounter (Signed)
Informed pt - she can pick up Clonazepam rx  On or after 3/3 and her letter will be at the front desk per Dr Isac Sarna. Stated "thank-you so very much".

## 2018-08-21 NOTE — Telephone Encounter (Signed)
I refilled this medication last week. Patient is not due for a refill until the end of March.

## 2018-08-22 MED FILL — clonazePAM 0.5 MG TABS: 0.5 | 15 days supply | Qty: 30 | Fill #0

## 2018-08-24 NOTE — Telephone Encounter (Signed)
Sorry about that! You should be able to close the encounter now.

## 2018-08-25 ENCOUNTER — Encounter: Payer: Self-pay | Admitting: Internal Medicine

## 2018-09-12 ENCOUNTER — Other Ambulatory Visit: Payer: Self-pay | Admitting: Internal Medicine

## 2018-09-13 NOTE — Telephone Encounter (Signed)
[facility] no longer accepting refills-prescriptions to be sent to [facility] until further notice.Despina Hidden Cassady3/25/20203:07 PM

## 2018-09-13 NOTE — Telephone Encounter (Signed)
Refill Request  [facility] - [place], [place] - [address] clonazePAM (KLONOPIN) 0.5 MG tablet

## 2018-09-18 ENCOUNTER — Other Ambulatory Visit: Payer: Self-pay

## 2018-09-18 NOTE — Telephone Encounter (Signed)
clonazePAM (KLONOPIN) 0.5 MG tablet, refill request @  [place], [place] - [place] [phone] (Phone) [phone] (Fax)

## 2018-09-18 NOTE — Telephone Encounter (Signed)
Clonazepam #30 sent to [facility] on 09/15/2018. Patient notified. Hubbard Hartshorn, RN, BSN

## 2018-09-20 MED FILL — clonazePAM 0.5 MG TABS: 0.5 | 15 days supply | Qty: 30 | Fill #0

## 2018-10-13 ENCOUNTER — Other Ambulatory Visit: Payer: Self-pay

## 2018-10-13 ENCOUNTER — Ambulatory Visit (INDEPENDENT_AMBULATORY_CARE_PROVIDER_SITE_OTHER): Payer: BLUE CROSS/BLUE SHIELD | Admitting: Internal Medicine

## 2018-10-13 DIAGNOSIS — F419 Anxiety disorder, unspecified: Secondary | ICD-10-CM | POA: Diagnosis not present

## 2018-10-13 MED ORDER — CLONAZEPAM 0.5 MG PO TABS: ORAL_TABLET | ORAL | 0 refills | Status: DC

## 2018-10-15 ENCOUNTER — Encounter: Payer: Self-pay | Admitting: Internal Medicine

## 2018-10-15 NOTE — Assessment & Plan Note (Signed)
Patient reports increased anxiety the last 2 weeks due to being out of work and not been able to send money to her family in [place]. She is currently on Zoloft 100 mg and Klonopin 0.5 mg BID PRN. Klonopin helps. Refilled it at 0.5 mg BID (#30 tablets for 1 month, no RF). I offered referral for therapy with [facility] but patient declined. Will continue to follow closely.
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
## 2018-10-15 NOTE — Progress Notes (Signed)
Medicine attending: Medical history, presenting problems,  and medications, reviewed with resident physician Dr Isac Sarna on the day of the patient telephone consultation and I concur with her evaluation and management plan.

## 2018-10-19 MED FILL — clonazePAM 0.5 MG TABS: 0.5 | 15 days supply | Qty: 30 | Fill #0

## 2018-11-01 ENCOUNTER — Other Ambulatory Visit: Payer: Self-pay

## 2018-11-01 ENCOUNTER — Ambulatory Visit (INDEPENDENT_AMBULATORY_CARE_PROVIDER_SITE_OTHER): Payer: BLUE CROSS/BLUE SHIELD | Admitting: Internal Medicine

## 2018-11-01 ENCOUNTER — Encounter: Payer: Self-pay | Admitting: Internal Medicine

## 2018-11-01 VITALS — BP 124/66 | HR 72 | Temp 99.1°F | Ht 62.0 in

## 2018-11-01 DIAGNOSIS — G43119 Migraine with aura, intractable, without status migrainosus: Secondary | ICD-10-CM

## 2018-11-01 DIAGNOSIS — G43909 Migraine, unspecified, not intractable, without status migrainosus: Secondary | ICD-10-CM | POA: Diagnosis not present

## 2018-11-01 MED ORDER — PROCHLORPERAZINE EDISYLATE 10 MG/2ML IJ SOLN: 10.0000 mg | Freq: Once | INTRAMUSCULAR | Status: DC

## 2018-11-01 MED ORDER — NAPROXEN 500 MG PO TABS: 500.0000 mg | ORAL_TABLET | Freq: Two times a day (BID) | ORAL | 2 refills | Status: DC | PRN

## 2018-11-01 MED ORDER — FLUTICASONE PROPIONATE 50 MCG/ACT NA SUSP: 1.0000 | Freq: Every day | NASAL | 2 refills | Status: DC

## 2018-11-01 MED ORDER — PROMETHAZINE HCL 25 MG PO TABS
25.0000 mg | ORAL_TABLET | Freq: Once | ORAL | Status: AC
  Administered 2018-11-01: 25 mg via ORAL

## 2018-11-01 MED ORDER — KETOROLAC TROMETHAMINE 30 MG/ML IJ SOLN
30.0000 mg | Freq: Once | INTRAMUSCULAR | Status: AC
  Administered 2018-11-01: 30 mg via INTRAMUSCULAR

## 2018-11-01 MED ORDER — FEXOFENADINE HCL 180 MG PO TABS: 180.0000 mg | ORAL_TABLET | Freq: Every day | ORAL | 2 refills | Status: DC

## 2018-11-01 NOTE — Patient Instructions (Addendum)
Thank you for visiting clinic today. We are giving you an injection and nausea medicine today, hopefully that will take care of your headache. I am also giving you a prescription medicine called naproxen you can take it with food when needed for headache. I am also giving you some allergy medication called Allegra and nasal drops called Flonase.  You will take Allegra 1 tablet daily and put 1 drop in each nares daily. Please follow-up with your PCP according to your scheduled appointment.  Gracias por visitar la clnica hoy. Hoy le daremos una inyeccin y un medicamento para las nuseas, con suerte eso se Building surveyor de su dolor de [place]. Tambin le estoy dando un medicamento recetado llamado naproxeno que puede tomar con alimentos cuando sea necesario para el dolor de [place]. Tambin le estoy dando un medicamento para la Scientist, clinical (histocompatibility and immunogenetics) y gotas nasales llamadas Flonase. Tomars Allegra 1 tableta al da y pondrs 1 gota en cada nariz por da. Haga un seguimiento con su PCP de acuerdo con su cita programada.

## 2018-11-01 NOTE — Assessment & Plan Note (Signed)
She was having characteristics of migraine with photophobia and nausea, but was able to tolerate light doing light reflex. May be a stress headache. She was having puffy eyes with rhinorrhea-most likely due to allergies. She wants to give her some injection for pain.   -Give her Toradol 30 mg IM once. -Promethazine 25 mg p.o. once. -Naproxen 500 mg to be taken as needed for headaches. -Allegra 180 mg daily. -Flonase.
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
## 2018-11-03 NOTE — Progress Notes (Signed)
Internal Medicine Clinic Attending  Case discussed with Dr. Amin at the time of the visit.  We reviewed the resident's history and exam and pertinent patient test results.  I agree with the assessment, diagnosis, and plan of care documented in the resident's note.    

## 2018-11-14 ENCOUNTER — Other Ambulatory Visit: Payer: Self-pay | Admitting: Internal Medicine

## 2018-11-17 MED FILL — clonazePAM 0.5 MG TABS: 0.5 | 15 days supply | Qty: 30 | Fill #0

## 2018-11-27 ENCOUNTER — Other Ambulatory Visit: Payer: Self-pay

## 2018-11-27 ENCOUNTER — Encounter: Payer: Self-pay | Admitting: Internal Medicine

## 2018-11-27 ENCOUNTER — Ambulatory Visit: Payer: BLUE CROSS/BLUE SHIELD | Admitting: Internal Medicine

## 2018-11-27 VITALS — BP 117/59 | HR 64 | Temp 98.2°F | Wt 123.3 lb

## 2018-11-27 DIAGNOSIS — S0086XA Insect bite (nonvenomous) of other part of head, initial encounter: Secondary | ICD-10-CM

## 2018-11-27 DIAGNOSIS — S40861A Insect bite (nonvenomous) of right upper arm, initial encounter: Secondary | ICD-10-CM

## 2018-11-27 DIAGNOSIS — R0602 Shortness of breath: Secondary | ICD-10-CM | POA: Insufficient documentation

## 2018-11-27 DIAGNOSIS — H029 Unspecified disorder of eyelid: Secondary | ICD-10-CM | POA: Insufficient documentation

## 2018-11-27 DIAGNOSIS — W57XXXA Bitten or stung by nonvenomous insect and other nonvenomous arthropods, initial encounter: Secondary | ICD-10-CM | POA: Insufficient documentation

## 2018-11-27 DIAGNOSIS — S40862A Insect bite (nonvenomous) of left upper arm, initial encounter: Secondary | ICD-10-CM | POA: Diagnosis not present

## 2018-11-27 DIAGNOSIS — G43019 Migraine without aura, intractable, without status migrainosus: Secondary | ICD-10-CM

## 2018-11-27 DIAGNOSIS — G43909 Migraine, unspecified, not intractable, without status migrainosus: Secondary | ICD-10-CM

## 2018-11-27 DIAGNOSIS — R590 Localized enlarged lymph nodes: Secondary | ICD-10-CM | POA: Insufficient documentation

## 2018-11-27 DIAGNOSIS — R0609 Other forms of dyspnea: Secondary | ICD-10-CM | POA: Insufficient documentation

## 2018-11-27 MED ORDER — HYDROXYZINE HCL 10 MG PO TABS: 10.0000 mg | ORAL_TABLET | Freq: Three times a day (TID) | ORAL | 0 refills | Status: DC | PRN

## 2018-11-27 MED ORDER — TOPIRAMATE 25 MG PO TABS: 25.0000 mg | ORAL_TABLET | Freq: Every day | ORAL | 2 refills | Status: DC

## 2018-11-27 MED FILL — hydrOXYzine HCL 10 MG TABS: 10 | 10 days supply | Qty: 30 | Fill #0

## 2018-11-27 MED FILL — TOPIRAMATE 25 MG TAB: 25 | 30 days supply | Qty: 30 | Fill #0
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
## 2018-11-27 NOTE — Assessment & Plan Note (Signed)
Patient presents with diffuse insect bites over face and bilateral upper extremities.  She states these occurred about 1 month ago during the night.  She is not sure what kind of insect bit her.  Since then she has been experiencing itchiness at the bite sites. She tried washing her bed sheets but has continued to experience itchiness. She has not identified any additional bites since then.  On exam, she does have very small insect bite over her forehead, right eye, and bilateral upper extremities.  None of these appear infected.  No blisters noted.  - Hydroxyzine 10 mg TID PRN

## 2018-11-27 NOTE — Patient Instructions (Addendum)
Karen [name],   Para su dolor de cabeza, le vamos a recetar topamax 25 mg. tomese una tableta [employer] Este medicamente es para prevenir las migranas.    Su problema con la respiracion, puede deberse a varias cosas. Ordene pruebas funcionales de los pulmones para Karen ser. Karen [name] a llamar para coordinar una cita.   Para la comezon, le recete hydroxixine. Se puede tomar 1 tablet 3 veces al dia solamente cuando lo necesite.   Tambien hice un referrido al oftalmologo para evaluacion de la bolita que tiene en el ojo.   Llamenos si tiene algun pregunta.   - DRa. Frederico Hamman

## 2018-11-27 NOTE — Assessment & Plan Note (Signed)
>>  ASSESSMENT AND PLAN FOR DYSPNEA ON EXERTION WRITTEN ON 11/27/2018  5:02 PM BY Burna Cash, MD  Patient presents complaining of frequent episodes of shortness of breath last about 1 minute and occur both on exertion and at rest. She has also noticed wheezing at night. Denies chest pain, cough, and current smoking.  States these feel different from her anxiety attacks.  She has a history of asthma in her chart, but is not on any inhalers.  She has never been hospitalized for asthma.  No PFTs in chart.  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 Pulmonary exam is unremarkable today.   Unclear etiology at this time, but I do suspect anxiety may be playing a role in patient's symptoms as SOB started when she started wearing a mask due to COVID pandemic. She is also wearing her mask below her nose as this helps with breathing. Other possible etiologies include asthma, pneumonitis from chemicall irritants, etc.  - PFTs with methacholine challenge

## 2018-11-27 NOTE — Assessment & Plan Note (Signed)
Patient presents with a lesion in the center of her right eyelid that has been present for about 1 month after an insect bite below her R eyebrow. She states it has been growing size and that she is now having changes in her vision (blurriness). On exam, there is a small papule on the R eyelid.  No signs of infection noted.  Will refer to ophthalmology for further evaluation. - Ophthalmology referral

## 2018-11-27 NOTE — Assessment & Plan Note (Signed)
Patient presents for follow-up of headaches.  She was seen 1 month ago for this and prescribed naproxen and promethazine for management of tension headaches.  She presents today complaining of constant headaches, 6 out of 7 days of the week, the last all day long.  She describes them as pulsatile and are associated with nausea and photophobia.  Naproxen is not helping.  Neurologic examination is normal.    Patient description of headaches consistent with migraine headaches.  Will start migraine prophylaxis given frequency of these. - Topamax 25 mg QHS

## 2018-11-27 NOTE — Assessment & Plan Note (Addendum)
Patient presents complaining of frequent episodes of shortness of breath last about 1 minute and occur both on exertion and at rest. She has also noticed wheezing at night. Denies chest pain, cough, and current smoking.  States these feel different from her anxiety attacks.  She has a history of asthma in her chart, but is not on any inhalers.  She has never been hospitalized for asthma.  No PFTs in chart.  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 Pulmonary exam is unremarkable today.   Unclear etiology at this time, but I do suspect anxiety may be playing a role in patient's symptoms as SOB started when she started wearing a mask due to [place] pandemic. She is also wearing her mask below her nose as this helps with breathing. Other possible etiologies include asthma, pneumonitis from chemicall irritants, etc.  - PFTs with methacholine challenge

## 2018-12-01 NOTE — Progress Notes (Signed)
Internal Medicine Clinic Attending  Case discussed with Dr. Santos-Sanchez at the time of the visit.  We reviewed the resident's history and exam and pertinent patient test results.  I agree with the assessment, diagnosis, and plan of care documented in the resident's note.    

## 2018-12-07 MED FILL — OFLOXACIN 0.3% EYE DROPS: 0.3 | 25 days supply | Qty: 5 | Fill #0

## 2018-12-13 ENCOUNTER — Other Ambulatory Visit: Payer: Self-pay | Admitting: Internal Medicine

## 2018-12-18 MED FILL — clonazePAM 0.5 MG TABS: 0.5 | 15 days supply | Qty: 30 | Fill #0

## 2018-12-27 ENCOUNTER — Other Ambulatory Visit: Payer: Self-pay | Admitting: Internal Medicine

## 2018-12-27 DIAGNOSIS — M5431 Sciatica, right side: Secondary | ICD-10-CM

## 2018-12-29 NOTE — Addendum Note (Signed)
Addended by: Hulan Fray on: 12/29/2018 12:49 PM   Modules accepted: Orders

## 2019-01-10 ENCOUNTER — Ambulatory Visit: Payer: BLUE CROSS/BLUE SHIELD | Admitting: Internal Medicine

## 2019-01-10 ENCOUNTER — Other Ambulatory Visit: Payer: Self-pay

## 2019-01-10 VITALS — BP 113/67 | HR 61 | Temp 98.4°F | Ht 62.0 in | Wt 124.8 lb

## 2019-01-10 DIAGNOSIS — G8929 Other chronic pain: Secondary | ICD-10-CM | POA: Diagnosis not present

## 2019-01-10 DIAGNOSIS — F329 Major depressive disorder, single episode, unspecified: Secondary | ICD-10-CM

## 2019-01-10 DIAGNOSIS — G43909 Migraine, unspecified, not intractable, without status migrainosus: Secondary | ICD-10-CM

## 2019-01-10 DIAGNOSIS — M5431 Sciatica, right side: Secondary | ICD-10-CM

## 2019-01-10 DIAGNOSIS — M5441 Lumbago with sciatica, right side: Secondary | ICD-10-CM

## 2019-01-10 DIAGNOSIS — Z79899 Other long term (current) drug therapy: Secondary | ICD-10-CM

## 2019-01-10 DIAGNOSIS — G43019 Migraine without aura, intractable, without status migrainosus: Secondary | ICD-10-CM

## 2019-01-10 MED ORDER — CYCLOBENZAPRINE HCL 5 MG PO TABS: 5.0000 mg | ORAL_TABLET | Freq: Three times a day (TID) | ORAL | 0 refills | Status: DC | PRN

## 2019-01-10 MED ORDER — KETOROLAC TROMETHAMINE 30 MG/ML IJ SOLN
30.0000 mg | Freq: Once | INTRAMUSCULAR | Status: AC
  Administered 2019-01-10: 30 mg via INTRAMUSCULAR

## 2019-01-10 MED ORDER — DICLOFENAC SODIUM 1 % TD GEL: 2.0000 g | Freq: Four times a day (QID) | TRANSDERMAL | 2 refills | Status: DC

## 2019-01-10 MED FILL — CYCLOBENZAPRINE 5 MG TABLET: 5 | 10 days supply | Qty: 30 | Fill #0

## 2019-01-10 NOTE — Patient Instructions (Addendum)
Karen [name],  Fue un placer verte en la clnica hoy. Hoy hablamos de tu dolor de espalda.  - Hoy le administramos una inyeccin de ketorolaco para [employer] sntomas.  - En este momento, hemos enviado una derivacin a ciruga ortopdica para su inyeccin de espalda. Haga una cita lo antes posible.  - Hemos enviado Diclofenac 1% gel y Flexeril para su dolor de espalda. Por favor, use segn las indicaciones.  Por favor contctenos si tiene alguna inquietud.  Gracias!
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
## 2019-01-10 NOTE — Assessment & Plan Note (Signed)
Patient reports continued headaches, mostly upon waking up in the morning and lasting all day. She reports that it involves her entire head and is associated with nausea. She denies photophobia at this time. Patient reports that headaches are not relieved with naproxen or promethazine. She reports that Topamax has not helped in reducing frequency. She does note increased stress at home and work.  Neurologic exam is wnl.  Headaches are likely mix of migraine and tension headaches.  Patient reported relief of headache after ketorolac injection for sciatica pain.   - Continue current therapy  - Follow up with PCP for further management

## 2019-01-10 NOTE — Assessment & Plan Note (Addendum)
Patient presents with back pain radiating to right leg. Patient reports that she has been having back pain for past 3 years and was previously well controlled with steroid injections administered by IR. She reports last injection was February 2020. She reports that pain is unrelieved by Tylenol, NSAIDs and gabapentin. 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 Patient wants to get referral for steroid injection. On exam, tenderness to palpation of cervical, thoracic and lumbar spinal processes with increased tenderness on right lower back slightly above the iliac crest. Also noted tenderness of paraspinal muscles. Positive straight leg test on right. Strength 5/5 in all extremities.  Referral to IR placed by PCP however, due to insurance issues, IR recommended referral to orthopedic surgery.   -Ketorolac injection in office for pain control  - Diclofenac 1% topical cream and Flexeril Rx sent to pharmacy - Referral to orthopedic surgery placed

## 2019-01-11 ENCOUNTER — Telehealth: Payer: Self-pay | Admitting: *Deleted

## 2019-01-11 NOTE — Telephone Encounter (Signed)
Information was sent through CoverMyMeds for [place] for Diclofenac Gel 1%.  Approved 01/11/2019 thru 01/09/2022.  Sander Nephew, RN 4:17 PM

## 2019-01-12 MED FILL — DICLOFENAC SODIUM 1 % GEL: 1 | 13 days supply | Qty: 100 | Fill #0

## 2019-01-12 NOTE — Progress Notes (Signed)
Internal Medicine Clinic Attending  I saw and evaluated the patient.  I personally confirmed the key portions of the history and exam documented by Dr. Marva Panda and I reviewed pertinent patient test results.  The assessment, diagnosis, and plan were formulated together and I agree with the documentation in the resident's note.  Lenice Pressman, M.D., Ph.D.

## 2019-01-16 ENCOUNTER — Other Ambulatory Visit: Payer: Self-pay | Admitting: Internal Medicine

## 2019-01-16 MED FILL — clonazePAM 0.5 MG TABS: 0.5 | 30 days supply | Qty: 30 | Fill #0

## 2019-02-05 ENCOUNTER — Ambulatory Visit: Payer: BLUE CROSS/BLUE SHIELD | Admitting: Internal Medicine

## 2019-02-05 ENCOUNTER — Other Ambulatory Visit: Payer: Self-pay

## 2019-02-05 ENCOUNTER — Encounter: Payer: Self-pay | Admitting: Internal Medicine

## 2019-02-05 VITALS — BP 124/70 | HR 62 | Temp 99.3°F | Ht 62.0 in | Wt 133.1 lb

## 2019-02-05 DIAGNOSIS — G894 Chronic pain syndrome: Secondary | ICD-10-CM

## 2019-02-05 DIAGNOSIS — M5441 Lumbago with sciatica, right side: Secondary | ICD-10-CM

## 2019-02-05 DIAGNOSIS — F419 Anxiety disorder, unspecified: Secondary | ICD-10-CM

## 2019-02-05 DIAGNOSIS — F339 Major depressive disorder, recurrent, unspecified: Secondary | ICD-10-CM | POA: Diagnosis not present

## 2019-02-05 DIAGNOSIS — Z79899 Other long term (current) drug therapy: Secondary | ICD-10-CM

## 2019-02-05 MED ORDER — KETOROLAC TROMETHAMINE 30 MG/ML IJ SOLN
30.0000 mg | Freq: Once | INTRAMUSCULAR | Status: AC
  Administered 2019-02-05: 30 mg via INTRAMUSCULAR

## 2019-02-05 NOTE — Patient Instructions (Signed)
Ambrielle,   Hicimos el referrido para la clinica donde siempre va a ponerse las inyecciones. La deben estar llamando esta semana para coordinar la cita. Si no la llaman, llame usted.   Para su ansiedad, le voy a enviar mas tabletas en el poximo rellenado. Estas tabletas adicionales son solo para dias dificiles cuando la ansiedad esta peor.   - Dr. Frederico Hamman

## 2019-02-06 ENCOUNTER — Encounter: Payer: Self-pay | Admitting: Internal Medicine

## 2019-02-06 ENCOUNTER — Other Ambulatory Visit: Payer: Self-pay | Admitting: Internal Medicine

## 2019-02-06 DIAGNOSIS — G894 Chronic pain syndrome: Secondary | ICD-10-CM

## 2019-02-06 NOTE — Assessment & Plan Note (Signed)
[name] has a history of anxiety and depression and presents today for follow-up.  She reports days of increased anxiety where she feels palpitations and shortness of breath.  This occurs 2 to 3 days/month. Klonopin helps with the symptoms but is only dosed once a day as needed.  She is otherwise doing well. Compliant with Zoloft. We discussed adding 5 additional tablets to her regular prescription for these days. She is in agreement with plan. Left before filling out GAD-7.

## 2019-02-06 NOTE — Assessment & Plan Note (Addendum)
Ms. [name] presents for follow up of chronic lower back pain associated with RLE sciatica. She has failed conservative management with NSAIDs and Tylenol and has been having spinal injections at IR clinic with great improvement in pain and function, especially at work. Last injection was in 07/2018. She was referred back in 11/2018 but unfortunately there was a misunderstanding with her insurance and she was sent to orthopedic surgery, her insurance does not cover this procedure through IR. She explained to me that she has been paying out of pocket  for these injections and would like to continue to go to the IR clinic and not to ortho. Placed IR referral again. Toradol 30 mg x1 for pain relief.
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
## 2019-02-06 NOTE — Assessment & Plan Note (Signed)
Her depression appears to be well controlled on sertraline. PHQ-9 was 4 which is the lowest it has been in the past year. Will continue Zoloft. Continues to decline counseling due to language barriers.

## 2019-02-08 NOTE — Progress Notes (Signed)
Internal Medicine Clinic Attending  Case discussed with Dr. Santos-Sanchez at the time of the visit.  We reviewed the resident's history and exam and pertinent patient test results.  I agree with the assessment, diagnosis, and plan of care documented in the resident's note.    

## 2019-02-13 ENCOUNTER — Other Ambulatory Visit: Payer: Self-pay | Admitting: Internal Medicine

## 2019-02-13 ENCOUNTER — Other Ambulatory Visit: Payer: Self-pay

## 2019-02-13 ENCOUNTER — Ambulatory Visit
Admission: RE | Admit: 2019-02-13 | Discharge: 2019-02-13 | Disposition: A | Discharge status: Home / Self Care | Payer: BLUE CROSS/BLUE SHIELD | Source: Ambulatory Visit | Attending: Internal Medicine | Admitting: Internal Medicine

## 2019-02-13 DIAGNOSIS — G894 Chronic pain syndrome: Secondary | ICD-10-CM

## 2019-02-13 MED ORDER — METHYLPREDNISOLONE ACETATE 40 MG/ML INJ SUSP (RADIOLOG
120.0000 mg | Freq: Once | INTRAMUSCULAR | Status: AC
  Administered 2019-02-13: 120 mg via EPIDURAL

## 2019-02-13 MED ORDER — IOPAMIDOL (ISOVUE-M 200) INJECTION 41%
1.0000 mL | Freq: Once | INTRAMUSCULAR | Status: AC
  Administered 2019-02-13: 14:00:00 1 mL via EPIDURAL

## 2019-02-14 MED FILL — clonazePAM 0.5 MG TABS: 0.5 | 30 days supply | Qty: 35 | Fill #0

## 2019-02-14 NOTE — Telephone Encounter (Signed)
Son called in stating med was not at pharmacy. Refill had too many characters in the Sig so reverted to Print Option. Clonazepam called to [place] at [facility]. PCP aware and patient notified L. Silvano Rusk, RN, BSN

## 2019-03-19 MED FILL — clonazePAM 0.5 MG TABS: 0.5 | 30 days supply | Qty: 35 | Fill #1

## 2019-04-16 ENCOUNTER — Other Ambulatory Visit: Payer: Self-pay

## 2019-04-16 ENCOUNTER — Ambulatory Visit (INDEPENDENT_AMBULATORY_CARE_PROVIDER_SITE_OTHER): Payer: BLUE CROSS/BLUE SHIELD | Admitting: Internal Medicine

## 2019-04-16 DIAGNOSIS — J029 Acute pharyngitis, unspecified: Secondary | ICD-10-CM | POA: Diagnosis not present

## 2019-04-16 DIAGNOSIS — Z20822 Contact with and (suspected) exposure to covid-19: Secondary | ICD-10-CM

## 2019-04-16 NOTE — Assessment & Plan Note (Signed)
Patient calls to be evaluated headache and sore throat since Saturday. She does not feel that her symptoms are progressing. In addition to her headache and sore throat she has had a slight nonproductive cough. She otherwise feels well and denies myalgias, arthralgias, fevers, nausea/vomiting, diarrhea, or other G.I. symptoms. 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 She is unsure of their health status is. She is unable to visualize her posterior oropharynx in the mirror. She does not feel any lymphadenopathy in her neck or around her ears on cell exam. She is not tried any over-the-counter medications.  A/P: - Viral pharyngitis - COVID-19 testing  - Symptomatic management.  - Recommended to return for further evaluation if she has fevers, SHOB, worsening of symptoms.  - Work note provided

## 2019-04-16 NOTE — Progress Notes (Signed)
Internal Medicine Clinic Attending  Case discussed with Dr. Helberg at the time of the visit.  We reviewed the resident's history and exam and pertinent patient test results.  I agree with the assessment, diagnosis, and plan of care documented in the resident's note.    
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
## 2019-04-17 LAB — NOVEL CORONAVIRUS, NAA: SARS-CoV-2, NAA: NOT DETECTED

## 2019-04-17 MED FILL — clonazePAM 0.5 MG TABS: 0.5 | 30 days supply | Qty: 35 | Fill #2

## 2019-04-18 ENCOUNTER — Telehealth: Payer: Self-pay | Admitting: *Deleted

## 2019-04-18 NOTE — Telephone Encounter (Signed)
Received fax request from [facility] for refill on sertraline 50 mg. Last Rx sent 07/06/2017 for 30 tabs with 2 refills. Please advise. Hubbard Hartshorn, BSN, RN-BC

## 2019-04-19 ENCOUNTER — Other Ambulatory Visit: Payer: Self-pay | Admitting: Internal Medicine

## 2019-04-19 MED ORDER — SERTRALINE HCL 100 MG PO TABS: 100.0000 mg | ORAL_TABLET | Freq: Every day | ORAL | 3 refills | Status: DC

## 2019-04-20 MED FILL — SERTRALINE HCL 100 MG TAB: 100 | 30 days supply | Qty: 30 | Fill #0

## 2019-05-21 ENCOUNTER — Other Ambulatory Visit: Payer: Self-pay | Admitting: Internal Medicine

## 2019-05-21 MED ORDER — CLONAZEPAM 0.5 MG PO TABS: ORAL_TABLET | ORAL | 2 refills | Status: DC

## 2019-05-21 MED FILL — clonazePAM 0.5 MG TABS: 0.5 | 30 days supply | Qty: 35 | Fill #0

## 2019-05-21 NOTE — Telephone Encounter (Signed)
Needs refill on clonazePAM (KLONOPIN) 0.5 MG tablet   pt contact [phone];  [place], [place] - [address] [facility].

## 2019-06-20 MED FILL — clonazePAM 0.5 MG TABS: 0.5 | 30 days supply | Qty: 35 | Fill #1

## 2019-07-23 MED FILL — clonazePAM 0.5 MG TABS: 0.5 | 30 days supply | Qty: 35 | Fill #2

## 2019-08-13 ENCOUNTER — Ambulatory Visit (INDEPENDENT_AMBULATORY_CARE_PROVIDER_SITE_OTHER): Payer: 59 | Admitting: Internal Medicine

## 2019-08-13 ENCOUNTER — Encounter: Payer: Self-pay | Admitting: Internal Medicine

## 2019-08-13 VITALS — BP 121/66 | HR 69 | Temp 98.6°F | Ht 62.0 in | Wt 126.3 lb

## 2019-08-13 DIAGNOSIS — F339 Major depressive disorder, recurrent, unspecified: Secondary | ICD-10-CM

## 2019-08-13 DIAGNOSIS — Z23 Encounter for immunization: Secondary | ICD-10-CM | POA: Diagnosis not present

## 2019-08-13 DIAGNOSIS — M5431 Sciatica, right side: Secondary | ICD-10-CM | POA: Diagnosis not present

## 2019-08-13 DIAGNOSIS — Z79899 Other long term (current) drug therapy: Secondary | ICD-10-CM

## 2019-08-13 DIAGNOSIS — G4700 Insomnia, unspecified: Secondary | ICD-10-CM

## 2019-08-13 DIAGNOSIS — L659 Nonscarring hair loss, unspecified: Secondary | ICD-10-CM | POA: Insufficient documentation

## 2019-08-13 DIAGNOSIS — F419 Anxiety disorder, unspecified: Secondary | ICD-10-CM

## 2019-08-13 DIAGNOSIS — J302 Other seasonal allergic rhinitis: Secondary | ICD-10-CM | POA: Insufficient documentation

## 2019-08-13 DIAGNOSIS — M5441 Lumbago with sciatica, right side: Secondary | ICD-10-CM

## 2019-08-13 DIAGNOSIS — E559 Vitamin D deficiency, unspecified: Secondary | ICD-10-CM | POA: Insufficient documentation
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
## 2019-08-13 NOTE — Patient Instructions (Addendum)
Ananias Pilgrim,   Le hice el referido para las inyecciones de la espalda. La oficina la llamar para coordinar una cita.   Para la Doran Durand, puede usar un Neti pod. Siga las instrucciones en el paquete.   Le hicimos analisis de [place] para ver que puede estar causando la perdidia de cabello. SI sale normales, le hare un referido al dermatologo.   Haga una cita de seguimiento conmigo en 6 meses.   - Dr. Frederico Hamman

## 2019-08-13 NOTE — Assessment & Plan Note (Addendum)
This is a new problem that she started experiencing about 5 months ago. She has noticed it more around the temporal area. It is not associated with othert symptoms such as rash or pruritus. Denies recent changes in hair products. Does use head bands and pony tails.  No recent major stressors, illness, or surgeries. No recent changes in medications or diet. Does not have a history of thyroid disease. On exam, she appears well nourished. I did not appreciate any bald spots. DDx: telogen effluvium, traction alopecia, thyroid disease, IDA, etc.   - CBC, ferritin, and TSH  - Advise against use of head bands and tight pony tails  - If workup is normal, will refer to dermatology

## 2019-08-13 NOTE — Assessment & Plan Note (Signed)
This problem is chronic and stable. She takes Zoloft 100 mg QD and klonopin 0.5 mg BID PRN. GAD7 = 7 today. Declined counseling today. Will continue current management.

## 2019-08-13 NOTE — Assessment & Plan Note (Addendum)
This problem is chronic and stable. She is compliant with Zoloft 100 mg QD. Denies side effects. PHQ-9 is 6 which is much improved for her. Declined counseling today. Will continue current regimen.

## 2019-08-13 NOTE — Assessment & Plan Note (Signed)
Patient has a history of R-sided sciatica that is a chronic issue and currently uncontrolled. She used to receive lumbar steroid injections which were greatly effective for her but has not been able to get them due to insurance issues. She has been managing the pain with Tylenol and Ibuprofen, but has not experienced much relief. She has been able to continue working and performing ADLs during this time. She recently changed insurance and is asking for a new IR referral.  - IR referral placed

## 2019-08-13 NOTE — Assessment & Plan Note (Signed)
History of vitamin D deficiency with level of 13.1 in 01/2019. Currently on vit D 1000 units. Will recheck today.

## 2019-08-14 ENCOUNTER — Emergency Department (HOSPITAL_COMMUNITY)
Admission: EM | Admit: 2019-08-14 | Discharge: 2019-08-14 | Disposition: A | Discharge status: Home / Self Care | Payer: 59 | Attending: Emergency Medicine | Admitting: Emergency Medicine

## 2019-08-14 ENCOUNTER — Other Ambulatory Visit: Payer: Self-pay

## 2019-08-14 ENCOUNTER — Encounter: Payer: Self-pay | Admitting: Internal Medicine

## 2019-08-14 ENCOUNTER — Emergency Department (HOSPITAL_COMMUNITY): Payer: 59

## 2019-08-14 ENCOUNTER — Encounter (HOSPITAL_COMMUNITY): Payer: Self-pay | Admitting: Emergency Medicine

## 2019-08-14 DIAGNOSIS — R197 Diarrhea, unspecified: Secondary | ICD-10-CM | POA: Diagnosis not present

## 2019-08-14 DIAGNOSIS — Z20822 Contact with and (suspected) exposure to covid-19: Secondary | ICD-10-CM | POA: Diagnosis not present

## 2019-08-14 DIAGNOSIS — R0789 Other chest pain: Secondary | ICD-10-CM | POA: Diagnosis not present

## 2019-08-14 DIAGNOSIS — R519 Headache, unspecified: Secondary | ICD-10-CM | POA: Insufficient documentation

## 2019-08-14 DIAGNOSIS — I10 Essential (primary) hypertension: Secondary | ICD-10-CM | POA: Insufficient documentation

## 2019-08-14 DIAGNOSIS — R112 Nausea with vomiting, unspecified: Secondary | ICD-10-CM | POA: Insufficient documentation

## 2019-08-14 LAB — COMPREHENSIVE METABOLIC PANEL
ALT: 14 U/L (ref 0–44)
AST: 21 U/L (ref 15–41)
Albumin: 4.1 g/dL (ref 3.5–5.0)
Alkaline Phosphatase: 61 U/L (ref 38–126)
Anion gap: 10 (ref 5–15)
BUN: 10 mg/dL (ref 6–20)
CO2: 24 mmol/L (ref 22–32)
Calcium: 8.9 mg/dL (ref 8.9–10.3)
Chloride: 102 mmol/L (ref 98–111)
Creatinine, Ser: 0.81 mg/dL (ref 0.44–1.00)
GFR calc Af Amer: >60 mL/min (ref >60)
GFR calc non Af Amer: >60 mL/min (ref >60)
Glucose, Bld: 128 mg/dL — ABNORMAL HIGH (ref 70–99)
Potassium: 3.4 mmol/L — ABNORMAL LOW (ref 3.5–5.1)
Sodium: 136 mmol/L (ref 135–145)
Total Bilirubin: 1.2 mg/dL (ref 0.3–1.2)
Total Protein: 7 g/dL (ref 6.5–8.1)

## 2019-08-14 LAB — TSH: TSH: 1.32 u[IU]/mL (ref 0.450–4.500)

## 2019-08-14 LAB — CBC
HCT: 39.1 % (ref 36.0–46.0)
Hemoglobin: 12.9 g/dL (ref 12.0–15.0)
MCH: 30 pg (ref 26.0–34.0)
MCHC: 33 g/dL (ref 30.0–36.0)
MCV: 90.9 fL (ref 80.0–100.0)
Platelets: 281 10*3/uL (ref 150–400)
RBC: 4.3 MIL/uL (ref 3.87–5.11)
RDW: 12.7 % (ref 11.5–15.5)
WBC: 7 10*3/uL (ref 4.0–10.5)
nRBC: 0 % (ref 0.0–0.2)

## 2019-08-14 LAB — CBC WITH DIFFERENTIAL/PLATELET
Basophils Absolute: 0 10*3/uL (ref 0.0–0.2)
Basos: 1 %
EOS (ABSOLUTE): 0.3 10*3/uL (ref 0.0–0.4)
Eos: 3 %
Hematocrit: 37.9 % (ref 34.0–46.6)
Hemoglobin: 12.6 g/dL (ref 11.1–15.9)
Immature Grans (Abs): 0 10*3/uL (ref 0.0–0.1)
Immature Granulocytes: 0 %
Lymphocytes Absolute: 2.7 10*3/uL (ref 0.7–3.1)
Lymphs: 34 %
MCH: 30.4 pg (ref 26.6–33.0)
MCHC: 33.2 g/dL (ref 31.5–35.7)
MCV: 91 fL (ref 79–97)
Monocytes Absolute: 0.4 10*3/uL (ref 0.1–0.9)
Monocytes: 6 %
Neutrophils Absolute: 4.5 10*3/uL (ref 1.4–7.0)
Neutrophils: 56 %
Platelets: 289 10*3/uL (ref 150–450)
RBC: 4.15 x10E6/uL (ref 3.77–5.28)
RDW: 13.3 % (ref 11.7–15.4)
WBC: 7.9 10*3/uL (ref 3.4–10.8)

## 2019-08-14 LAB — TROPONIN I (HIGH SENSITIVITY): Troponin I (High Sensitivity): 2 ng/L (ref <18)

## 2019-08-14 LAB — URINALYSIS, ROUTINE W REFLEX MICROSCOPIC
Bilirubin Urine: NEGATIVE
Glucose, UA: NEGATIVE mg/dL
Hgb urine dipstick: NEGATIVE
Ketones, ur: NEGATIVE mg/dL
Leukocytes,Ua: NEGATIVE
Nitrite: NEGATIVE
Protein, ur: 30 mg/dL — AB
Specific Gravity, Urine: 1.033 — ABNORMAL HIGH (ref 1.005–1.030)
pH: 5 (ref 5.0–8.0)

## 2019-08-14 LAB — FERRITIN: Ferritin: 38 ng/mL (ref 15–150)

## 2019-08-14 LAB — VITAMIN D 25 HYDROXY (VIT D DEFICIENCY, FRACTURES): Vit D, 25-Hydroxy: 36.4 ng/mL (ref 30.0–100.0)

## 2019-08-14 LAB — POC SARS CORONAVIRUS 2 AG -  ED: SARS Coronavirus 2 Ag: NEGATIVE

## 2019-08-14 LAB — LIPASE, BLOOD: Lipase: 34 U/L (ref 11–51)

## 2019-08-14 MED ORDER — ONDANSETRON HCL 4 MG/2ML IJ SOLN
4.0000 mg | Freq: Once | INTRAMUSCULAR | Status: AC
  Administered 2019-08-14: 4 mg via INTRAVENOUS
  Filled 2019-08-14: qty 2

## 2019-08-14 MED ORDER — PROCHLORPERAZINE MALEATE 10 MG PO TABS: 10.0000 mg | ORAL_TABLET | Freq: Two times a day (BID) | ORAL | 0 refills | Status: DC | PRN

## 2019-08-14 MED ORDER — SODIUM CHLORIDE 0.9 % IV BOLUS
1000.0000 mL | Freq: Once | INTRAVENOUS | Status: AC
  Administered 2019-08-14: 1000 mL via INTRAVENOUS

## 2019-08-14 MED ORDER — KETOROLAC TROMETHAMINE 30 MG/ML IJ SOLN
30.0000 mg | Freq: Once | INTRAMUSCULAR | Status: AC
  Administered 2019-08-14: 30 mg via INTRAVENOUS
  Filled 2019-08-14: qty 1

## 2019-08-14 MED ORDER — SODIUM CHLORIDE 0.9% FLUSH: 3.0000 mL | Freq: Once | INTRAVENOUS | Status: DC
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
## 2019-08-14 NOTE — ED Notes (Signed)
DC instructions reviewed with pt; provided work note and instructions on how to take her prescription. Pt verbalized understanding.

## 2019-08-14 NOTE — Discharge Instructions (Signed)
Hoy lo vieron en el departamento de emergencias con dolor de cabeza y dolor abdominal. Sus anlisis de laboratorio fueron [place], pero me gustara que los siguiera con un neurlogo y un mdico de atencin primaria. Si desarrolla un dolor abdominal que empeora o vomita, debe regresar al departamento de emergencias para una reevaluacin.

## 2019-08-14 NOTE — Progress Notes (Signed)
Internal Medicine Clinic Attending  Case discussed with Dr. Santos-Sanchez at the time of the visit.  We reviewed the resident's history and exam and pertinent patient test results.  I agree with the assessment, diagnosis, and plan of care documented in the resident's note.    

## 2019-08-14 NOTE — Assessment & Plan Note (Signed)
Patient has been using Flonase daily without improvement in symptoms. Recommended Zyrtec or Claritin but she states it is expensive and she does not want to use an oral medication, prefers intra-nasal. She will try a Neti pot.

## 2019-08-14 NOTE — ED Triage Notes (Addendum)
Pt arrives to ED from home with complaints of headache for the last month that sometimes gets better but is always there. Patient states that yesterday she started to vomit and have diarrhea that has continued today. Patient denies fevers or chills and any COVID contacts. Hx of IBS.

## 2019-08-15 ENCOUNTER — Other Ambulatory Visit: Payer: Self-pay | Admitting: Internal Medicine

## 2019-08-15 DIAGNOSIS — M5430 Sciatica, unspecified side: Secondary | ICD-10-CM

## 2019-08-20 ENCOUNTER — Other Ambulatory Visit: Payer: Self-pay | Admitting: Internal Medicine

## 2019-08-23 MED FILL — clonazePAM 0.5 MG TABS: 0.5 | 30 days supply | Qty: 35 | Fill #0

## 2019-09-14 ENCOUNTER — Other Ambulatory Visit: Payer: Self-pay | Admitting: Internal Medicine

## 2019-09-14 ENCOUNTER — Other Ambulatory Visit: Payer: Self-pay

## 2019-09-14 ENCOUNTER — Ambulatory Visit
Admission: RE | Admit: 2019-09-14 | Discharge: 2019-09-14 | Disposition: A | Discharge status: Home / Self Care | Payer: 59 | Source: Ambulatory Visit | Attending: Internal Medicine | Admitting: Internal Medicine

## 2019-09-14 DIAGNOSIS — M5430 Sciatica, unspecified side: Secondary | ICD-10-CM

## 2019-09-14 MED ORDER — IOPAMIDOL (ISOVUE-M 200) INJECTION 41%
1.0000 mL | Freq: Once | INTRAMUSCULAR | Status: AC
  Administered 2019-09-14: 11:00:00 1 mL via EPIDURAL

## 2019-09-14 MED ORDER — METHYLPREDNISOLONE ACETATE 40 MG/ML INJ SUSP (RADIOLOG
120.0000 mg | Freq: Once | INTRAMUSCULAR | Status: AC
  Administered 2019-09-14: 120 mg via EPIDURAL

## 2019-09-24 MED FILL — clonazePAM 0.5 MG TABS: 0.5 | 30 days supply | Qty: 35 | Fill #1

## 2019-10-12 ENCOUNTER — Encounter: Payer: Self-pay | Admitting: *Deleted

## 2019-10-24 MED FILL — clonazePAM 0.5 MG TABS: 0.5 | 30 days supply | Qty: 35 | Fill #2

## 2019-11-22 ENCOUNTER — Telehealth: Payer: Self-pay | Admitting: Internal Medicine

## 2019-11-22 NOTE — Telephone Encounter (Signed)
Overdue appt and UDS> Pls sch appt Karen [name] 1st clinic month

## 2019-11-23 MED FILL — clonazePAM 0.5 MG TABS: 0.5 | 30 days supply | Qty: 35 | Fill #0

## 2019-12-12 NOTE — Telephone Encounter (Signed)
Patient was called on 3 way with in-house spanish interpreter, Spero Geralds.  Patient has been reassigned to Dr. Gilford Rile and he will not be in clinic until Sept.  Patient scheduled on Red Team with Dr. Marianna Payment on 01/07/2020 at 2:45 pm.

## 2019-12-24 MED FILL — clonazePAM 0.5 MG TABS: 0.5 | 30 days supply | Qty: 35 | Fill #1

## 2020-01-01 ENCOUNTER — Telehealth (HOSPITAL_COMMUNITY): Payer: 59 | Admitting: Psychiatry

## 2020-01-01 ENCOUNTER — Encounter (HOSPITAL_COMMUNITY): Payer: Self-pay | Admitting: Psychiatry

## 2020-01-01 ENCOUNTER — Other Ambulatory Visit: Payer: Self-pay

## 2020-01-07 ENCOUNTER — Other Ambulatory Visit: Payer: Self-pay

## 2020-01-07 ENCOUNTER — Encounter: Payer: Self-pay | Admitting: Internal Medicine

## 2020-01-07 ENCOUNTER — Ambulatory Visit (INDEPENDENT_AMBULATORY_CARE_PROVIDER_SITE_OTHER): Payer: 59 | Admitting: Internal Medicine

## 2020-01-07 ENCOUNTER — Ambulatory Visit (HOSPITAL_COMMUNITY)
Admission: RE | Admit: 2020-01-07 | Discharge: 2020-01-07 | Disposition: A | Discharge status: Home / Self Care | Payer: 59 | Source: Ambulatory Visit | Attending: Family Medicine | Admitting: Family Medicine

## 2020-01-07 VITALS — BP 118/56 | HR 60 | Temp 98.6°F | Wt 122.4 lb

## 2020-01-07 DIAGNOSIS — R079 Chest pain, unspecified: Secondary | ICD-10-CM

## 2020-01-07 DIAGNOSIS — R0782 Intercostal pain: Secondary | ICD-10-CM

## 2020-01-07 DIAGNOSIS — G43909 Migraine, unspecified, not intractable, without status migrainosus: Secondary | ICD-10-CM | POA: Diagnosis not present

## 2020-01-07 DIAGNOSIS — R0602 Shortness of breath: Secondary | ICD-10-CM | POA: Diagnosis not present

## 2020-01-07 DIAGNOSIS — G43019 Migraine without aura, intractable, without status migrainosus: Secondary | ICD-10-CM

## 2020-01-07 MED ORDER — ALBUTEROL SULFATE HFA 108 (90 BASE) MCG/ACT IN AERS: 2.0000 | INHALATION_SPRAY | Freq: Four times a day (QID) | RESPIRATORY_TRACT | 0 refills | Status: DC | PRN

## 2020-01-07 MED ORDER — SUMATRIPTAN SUCCINATE 50 MG PO TABS: 50.0000 mg | ORAL_TABLET | ORAL | 0 refills | Status: DC | PRN

## 2020-01-07 MED FILL — ALBUTEROL SULFATE HFA 108 (: 108 (90 BAS | 25 days supply | Qty: 18 | Fill #0

## 2020-01-07 MED FILL — SUMAtriptan SUCCINATE 50 MG: 50 | 30 days supply | Qty: 9 | Fill #0

## 2020-01-07 NOTE — Patient Instructions (Signed)
Thank you, Ms.[name] for allowing [place] to provide your care today. Today we discussed Chest pain, Shortness of breath, Headaches.    I have ordered the following labs for you:  Lab Orders  No laboratory test(s) ordered today     I will call if any are abnormal. All of your labs can be accessed through "My Chart".  I have place a referrals to Pulmonology for Pulmonary Function Testing..  I have ordered the following tests: Pulmonary funciton testing   I have ordered the following medication/changed the following medications:  1. begin Immetrex 2. begin Albuterol 3. begin Warm compresses and over the ocunter pain medicine for chest pain.  Please follow-up in in 1 weeks.  Should you have any questions or concerns please call the internal medicine clinic at [phone].    Marianna Payment, D.O. [facility]   My Chart Access: [email or web address]   If you have not already done so, please get your COVID 19 vaccine  To schedule an appointment for a COVID vaccine choice any of the following: Go to [email or web address]   Go to [email or web address]                  Call [phone]                                     Call [phone] and select Option 2
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
## 2020-01-08 ENCOUNTER — Encounter: Payer: Self-pay | Admitting: Internal Medicine

## 2020-01-08 NOTE — Assessment & Plan Note (Signed)
>>  ASSESSMENT AND PLAN FOR DYSPNEA ON EXERTION WRITTEN ON 01/08/2020  2:21 PM BY COE, BENJAMIN, MD  Patient presents with a subacute to chronic history of shortness of breath.  She denies any other upper respiratory symptoms including cough, rhinorrhea, wheezing.  She does not smoke but does admit to potential environmental exposures during her job.  Patient uses cleaning supplies daily lites that may contribute to her shortness of breath.  She has tried albuterol inhaler in the past that has improved the symptoms.  On evaluation today the patient does not appear dyspneic and her lung exam was unremarkable.    Patient was previously referred for pulmonary function testing to further evaluate for possible asthma as she previously had symptoms of wheezing.  Plan -I counseled her on the importance of getting this test to further be able to treat her shortness of breath moving forward and she agreed. -In the short-term I prescribed the patient albuterol rescue inhaler for symptom management.

## 2020-01-08 NOTE — Assessment & Plan Note (Signed)
I did not have enough time to fully evaluate her current migraine headaches.  She does have a history of migraines and was previously prescribed Imitrex.  I will refill this prescription at this time and reevaluate her in 1 week.   Plan: 1.  Imitrex 50 mg p.o. every 2 hour as needed for migraine headaches

## 2020-01-08 NOTE — Assessment & Plan Note (Signed)
Patient presents with a 2-week history of chest pain with associated shortness of breath.  Patient states that the chest pain is present for the majority of the day with only minimal times where she is not experiencing the pain.  She states is worse with activity particularly when carrying a large backpack that she uses at work.  She states that the pain is also worse when she takes deep breaths and has point tenderness to the left chest wall at the costochondral junction.  She does experience shortness of breath is worse with exertion.  On exam the patient did have tenderness to palpation on the left costochondral region that reproduced her pain.  EKG showed sinus rhythm with no signs consistent with ischemic changes.  Patient likely has costochondritis and would likely benefit from conservative management with heat/cool compresses and over-the-counter pain medications such as Tylenol or ibuprofen.  Plan: -OTC pain medication -Warm/cold compresses

## 2020-01-08 NOTE — Assessment & Plan Note (Signed)
Patient presents with a subacute to chronic history of shortness of breath.  She denies any other upper respiratory symptoms including cough, rhinorrhea, wheezing.  She does not smoke but does admit to potential environmental exposures during her job.  Patient uses cleaning supplies daily lites that may contribute to her shortness of breath.  She has tried albuterol inhaler in the past that has improved the symptoms.  On evaluation today the patient does not appear dyspneic and her lung exam was unremarkable.    Patient was previously referred for pulmonary function testing to further evaluate for possible asthma as she previously had symptoms of wheezing.  Plan -I counseled her on the importance of getting this test to further be able to treat her shortness of breath moving forward and she agreed. -In the short-term I prescribed the patient albuterol rescue inhaler for symptom management.

## 2020-01-09 NOTE — Progress Notes (Signed)
Internal Medicine Clinic Attending  Case discussed with Dr. Coe  At the time of the visit.  We reviewed the resident's history and exam and pertinent patient test results.  I agree with the assessment, diagnosis, and plan of care documented in the resident's note.  

## 2020-01-14 ENCOUNTER — Encounter: Payer: Self-pay | Admitting: Internal Medicine

## 2020-01-14 ENCOUNTER — Ambulatory Visit (INDEPENDENT_AMBULATORY_CARE_PROVIDER_SITE_OTHER): Payer: 59 | Admitting: Internal Medicine

## 2020-01-14 VITALS — BP 124/80 | HR 64 | Temp 98.2°F | Ht 62.0 in | Wt 125.0 lb

## 2020-01-14 DIAGNOSIS — R0602 Shortness of breath: Secondary | ICD-10-CM | POA: Diagnosis not present

## 2020-01-14 DIAGNOSIS — G43009 Migraine without aura, not intractable, without status migrainosus: Secondary | ICD-10-CM | POA: Diagnosis not present

## 2020-01-14 DIAGNOSIS — M5431 Sciatica, right side: Secondary | ICD-10-CM
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
## 2020-01-14 NOTE — Assessment & Plan Note (Signed)
Mentions hx of migraines. Evaluated by Dr.Coe last visit. States headaches have resolved since restarting imitrex.   - C/w imitrex - Advised to avoidance of triggers

## 2020-01-14 NOTE — Assessment & Plan Note (Signed)
Presents w/ episodes of shortness of breath. Mentions was evaluated by Dr.Coe for this issue and was prescribed albuterol inhaler. Mentions that her symptoms is currently well controlled and she denies any dyspnea at this time. No smoking history but does have exposure to cleaning supplies and associated fumes. Mentions that she was advised to get PFTs done but have not received a call regarding scheduling PFTs.  A/P Dyspnea currently undergoing work-up. Chart review shows previously ordered PFTs which expired due to being 12 months since date of order. Does agree that she needs further work-up for her dyspnea on exertion. PFTs re-ordered.  - PFTs - C/w albuterol inhaler PRN

## 2020-01-14 NOTE — Assessment & Plan Note (Signed)
>>  ASSESSMENT AND PLAN FOR DYSPNEA ON EXERTION WRITTEN ON 01/14/2020  3:38 PM BY LEE, Artist Pais, MD  Presents w/ episodes of shortness of breath. Mentions was evaluated by Dr.Coe for this issue and was prescribed albuterol inhaler. Mentions that her symptoms is currently well controlled and she denies any dyspnea at this time. No smoking history but does have exposure to cleaning supplies and associated fumes. Mentions that she was advised to get PFTs done but have not received a call regarding scheduling PFTs.  A/P Dyspnea currently undergoing work-up. Chart review shows previously ordered PFTs which expired due to being 12 months since date of order. Does agree that she needs further work-up for her dyspnea on exertion. PFTs re-ordered.  - PFTs - C/w albuterol inhaler PRN

## 2020-01-14 NOTE — Assessment & Plan Note (Signed)
Karen [name] is a 54 yo F w/ PMH of PUD, MDD, Chronic sciatica presenting to [facility] w/ complaint of R-sided sciatica pain. This is chronic, uncontrolled. Previously had multiple nerve blocks performed by interventional radiology with significant improvement in her symptoms for couple months. Mentions having significant pain. Previously had difficulty obtaining insurance and so was unable to continue the injections but she now has coverage. Mentions continued pain with radiation down the right side with intermittent numbness and tingling. Denies significant focal weakness, saddle anesthesia, bowel/urinary incontinence or intractable pain.  A/P Present w/ chronic sciatica. Physical exam w/o red-flag symptoms. Chart review shows prior hx of Lumbar imaging w/ confirmed L4-L5 pathology. Previously advised for surgery but patient refused due to financial situation and inability to take time off work. Will make referral for IR  - Referral for IR - Discussed physical therapy, mentions that she had physical therapy in the past and performs exercises that she learned from PT at home but has not been able to due to being busy. Not interested in referral to PT at this time.

## 2020-01-14 NOTE — Patient Instructions (Signed)
Thank you for allowing [place] to provide your care today. Today we discussed your sciatica    I have ordered no labs for you. I will call if any are abnormal.    Today we made no changes to your medications.    Please follow-up as needed.    Should you have any questions or concerns please call the internal medicine clinic at [phone].     Sciatica  Sciatica is pain, weakness, tingling, or loss of feeling (numbness) along the sciatic nerve. The sciatic nerve starts in the lower back and goes down the back of each leg. Sciatica usually goes away on its own or with treatment. Sometimes, sciatica may come back (recur). What are the causes? This condition happens when the sciatic nerve is pinched or has pressure put on it. This may be the result of:  A disk in between the bones of the spine bulging out too far (herniated disk).  Changes in the spinal disks that occur with aging.  A condition that affects a muscle in the butt.  Extra bone growth near the sciatic nerve.  A break (fracture) of the area between your hip bones (pelvis).  Pregnancy.  Tumor. This is rare. What increases the risk? You are more likely to develop this condition if you:  Play sports that put pressure or stress on the spine.  Have poor strength and ease of movement (flexibility).  Have had a back injury in the past.  Have had back surgery.  Sit for long periods of time.  Do activities that involve bending or lifting over and over again.  Are very overweight (obese). What are the signs or symptoms? Symptoms can vary from mild to very bad. They may include:  Any of these problems in the lower back, leg, hip, or butt: ? Mild tingling, loss of feeling, or dull aches. ? Burning sensations. ? Sharp pains.  Loss of feeling in the back of the calf or the sole of the foot.  Leg weakness.  Very bad back pain that makes it hard to move. These symptoms may get worse when you cough, sneeze, or laugh.  They may also get worse when you sit or stand for long periods of time. How is this treated? This condition often gets better without any treatment. However, treatment may include:  Changing or cutting back on physical activity when you have pain.  Doing exercises and stretching.  Putting ice or heat on the affected area.  Medicines that help: ? To relieve pain and swelling. ? To relax your muscles.  Shots (injections) of medicines that help to relieve pain, irritation, and swelling.  Surgery. Follow these instructions at home: Medicines  Take over-the-counter and prescription medicines only as told by your doctor.  Ask your doctor if the medicine prescribed to you: ? Requires you to avoid driving or using heavy machinery. ? Can cause trouble pooping (constipation). You may need to take these steps to prevent or treat trouble pooping:  Drink enough fluids to keep your pee (urine) pale yellow.  Take over-the-counter or prescription medicines.  Eat foods that are high in fiber. These include beans, whole grains, and fresh fruits and vegetables.  Limit foods that are high in fat and sugar. These include fried or sweet foods. Managing pain      If told, put ice on the affected area. ? Put ice in a plastic bag. ? Place a towel between your skin and the bag. ? Leave the ice on for  20 minutes, 2-3 times a day.  If told, put heat on the affected area. Use the heat source that your doctor tells you to use, such as a moist heat pack or a heating pad. ? Place a towel between your skin and the heat source. ? Leave the heat on for 20-30 minutes. ? Remove the heat if your skin turns bright red. This is very important if you are unable to feel pain, heat, or cold. You may have a greater risk of getting burned. Activity   Return to your normal activities as told by your doctor. Ask your doctor what activities are safe for you.  Avoid activities that make your symptoms  worse.  Take short rests during the day. ? When you rest for a long time, do some physical activity or stretching between periods of rest. ? Avoid sitting for a long time without moving. Get up and move around at least one time each hour.  Exercise and stretch regularly, as told by your doctor.  Do not lift anything that is heavier than 10 lb (4.5 kg) while you have symptoms of sciatica. ? Avoid lifting heavy things even when you do not have symptoms. ? Avoid lifting heavy things over and over.  When you lift objects, always lift in a way that is safe for your body. To do this, you should: ? Bend your knees. ? Keep the object close to your body. ? Avoid twisting. General instructions  Stay at a healthy weight.  Wear comfortable shoes that support your feet. Avoid wearing high heels.  Avoid sleeping on a mattress that is too soft or too hard. You might have less pain if you sleep on a mattress that is firm enough to support your back.  Keep all follow-up visits as told by your doctor. This is important. Contact a doctor if:  You have pain that: ? Wakes you up when you are sleeping. ? Gets worse when you lie down. ? Is worse than the pain you have had in the past. ? Lasts longer than 4 weeks.  You lose weight without trying. Get help right away if:  You cannot control when you pee (urinate) or poop (have a bowel movement).  You have weakness in any of these areas and it gets worse: ? Lower back. ? The area between your hip bones. ? Butt. ? Legs.  You have redness or swelling of your back.  You have a burning feeling when you pee. Summary  Sciatica is pain, weakness, tingling, or loss of feeling (numbness) along the sciatic nerve.  This condition happens when the sciatic nerve is pinched or has pressure put on it.  Sciatica can cause pain, tingling, or loss of feeling (numbness) in the lower back, legs, hips, and butt.  Treatment often includes rest, exercise,  medicines, and putting ice or heat on the affected area. This information is not intended to replace advice given to you by your health care provider. Make sure you discuss any questions you have with your health care provider. Document Revised: 06/26/2018 Document Reviewed: 06/26/2018 Elsevier Patient Education  [place].

## 2020-01-16 NOTE — Progress Notes (Signed)
Internal Medicine Clinic Attending  Case discussed with Dr. Lee  At the time of the visit.  We reviewed the resident's history and exam and pertinent patient test results.  I agree with the assessment, diagnosis, and plan of care documented in the resident's note.    

## 2020-01-24 MED FILL — clonazePAM 0.5 MG TABS: 0.5 | 30 days supply | Qty: 35 | Fill #2

## 2020-01-25 ENCOUNTER — Other Ambulatory Visit: Payer: Self-pay | Admitting: Internal Medicine

## 2020-01-25 DIAGNOSIS — M5431 Sciatica, right side: Secondary | ICD-10-CM

## 2020-02-22 ENCOUNTER — Ambulatory Visit
Admission: RE | Admit: 2020-02-22 | Discharge: 2020-02-22 | Disposition: A | Discharge status: Home / Self Care | Payer: 59 | Source: Ambulatory Visit | Attending: Internal Medicine | Admitting: Internal Medicine

## 2020-02-22 DIAGNOSIS — M5431 Sciatica, right side: Secondary | ICD-10-CM

## 2020-02-22 MED ORDER — METHYLPREDNISOLONE ACETATE 40 MG/ML INJ SUSP (RADIOLOG
120.0000 mg | Freq: Once | INTRAMUSCULAR | Status: AC
  Administered 2020-02-22: 120 mg via EPIDURAL

## 2020-02-22 MED ORDER — IOPAMIDOL (ISOVUE-M 200) INJECTION 41%
1.0000 mL | Freq: Once | INTRAMUSCULAR | Status: AC
  Administered 2020-02-22: 1 mL via EPIDURAL

## 2020-02-26 ENCOUNTER — Other Ambulatory Visit: Payer: Self-pay | Admitting: Internal Medicine

## 2020-02-26 ENCOUNTER — Telehealth: Payer: Self-pay | Admitting: Internal Medicine

## 2020-02-26 DIAGNOSIS — G43019 Migraine without aura, intractable, without status migrainosus: Secondary | ICD-10-CM

## 2020-02-26 MED ORDER — CLONAZEPAM 0.5 MG PO TABS: ORAL_TABLET | ORAL | 2 refills | Status: DC

## 2020-02-26 MED ORDER — SUMATRIPTAN SUCCINATE 50 MG PO TABS: 50.0000 mg | ORAL_TABLET | ORAL | 0 refills | Status: DC | PRN

## 2020-02-26 MED FILL — clonazePAM 0.5 MG TABS: 0.5 | 30 days supply | Qty: 35 | Fill #0

## 2020-02-26 MED FILL — SUMAtriptan SUCCINATE 50 MG: 50 | 30 days supply | Qty: 9 | Fill #0

## 2020-02-26 NOTE — Telephone Encounter (Signed)
Need refill on clonazePAM (KLONOPIN) 0.5 MG tablet SUMAtriptan (IMITREX) 50 MG tablet  ;pt contact [place], [place] - [address] [facility].

## 2020-02-26 NOTE — Telephone Encounter (Signed)
I called Ms. Edsel Petrin using a [place] interpreter.  She states that she only take her sumatriptan once in a while when she experiences severe migraine.

## 2020-03-24 ENCOUNTER — Ambulatory Visit (INDEPENDENT_AMBULATORY_CARE_PROVIDER_SITE_OTHER): Payer: 59 | Admitting: Student

## 2020-03-24 ENCOUNTER — Encounter: Payer: Self-pay | Admitting: Student

## 2020-03-24 ENCOUNTER — Other Ambulatory Visit (HOSPITAL_COMMUNITY)
Admission: RE | Admit: 2020-03-24 | Discharge: 2020-03-24 | Disposition: A | Discharge status: Home / Self Care | Payer: 59 | Source: Ambulatory Visit | Attending: Internal Medicine | Admitting: Internal Medicine

## 2020-03-24 VITALS — BP 121/65 | HR 60 | Temp 98.4°F | Wt 124.4 lb

## 2020-03-24 DIAGNOSIS — N898 Other specified noninflammatory disorders of vagina: Secondary | ICD-10-CM | POA: Diagnosis present

## 2020-03-24 DIAGNOSIS — L299 Pruritus, unspecified: Secondary | ICD-10-CM | POA: Insufficient documentation

## 2020-03-24 DIAGNOSIS — Z23 Encounter for immunization: Secondary | ICD-10-CM

## 2020-03-24 LAB — POCT URINALYSIS DIPSTICK
Bilirubin, UA: NEGATIVE
Blood, UA: NEGATIVE
Glucose, UA: NEGATIVE
Nitrite, UA: NEGATIVE
Protein, UA: NEGATIVE
Spec Grav, UA: <=1.005 — AB (ref 1.010–1.025)
Urobilinogen, UA: 1 E.U./dL
pH, UA: 6 (ref 5.0–8.0)

## 2020-03-24 MED ORDER — LORATADINE 10 MG PO TABS: 10.0000 mg | ORAL_TABLET | Freq: Every day | ORAL | 2 refills | Status: DC

## 2020-03-24 NOTE — Assessment & Plan Note (Addendum)
Patient reported vaginal discharge that started 2 weeks ago. She endorses mild pelvic pressure but denies any vaginal itching, dysuria, vaginal pain or burning. Patient states she is sexually active and was tested for STI last year. Chart review shows that patient has tested positive for gonorrhea and BV in the past.  No evidence of discharge on vaginal exam. UA negative for UTI.  Plan: --Cervicovaginal ancillary testing sent --Call and inform patient about test results  **ADDENDUM** Cervicovaginal ancillary testing came back positive for bacteria vaginosis. Patient was informed of the results via an interpreter on the phone. Patient was informed to pick up the antibiotic to treat the infection.  --Metronidazole 500 mg po BID for 7 days

## 2020-03-24 NOTE — Assessment & Plan Note (Signed)
Patient presented with itching of both hands that started 1 week ago. States the itching comes and goes but is worse when she is at work.  She describes associated numbness/tingling and hotness to her hands.  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  She denies any rash, bumps or pain in her hands.  She has tried oral Benadryl, hydrocortisone cream and Benadryl spray without relief.  Exam was negative for rash or lesions on her hands.  Plan: This is likely an isolated pruritus that is not associated with any underlying medical problem.  We will start patient on antihistamine to see if this improves her symptoms.  She is advised to take a picture of her hands when the itching starts and follow-up if there are no improvements with the antihistamine --Claritin 10 mg daily as needed for itching

## 2020-03-24 NOTE — Patient Instructions (Addendum)
Thank you, [name] for allowing [place] to provide your care today. Today we discussed  Your itching hands and vaginal discharge.     I have ordered the following labs for you:   Lab Orders     POCT Urinalysis Dipstick (95188)   I will call if any are abnormal. All of your labs can be accessed through "My Chart".   I have ordered the following tests: Vaginal swab  I have ordered the following medication/changed the following medications:  1. Claritin 10 mg daily  Please follow-up in as needed if symptoms do not improve  Should you have any questions or concerns please call the internal medicine clinic at [phone].    Karen Dibbles, MD, MPH [facility]   My Chart Access: [email or web address]   If you have not already done so, please get your COVID 19 vaccine  To schedule an appointment for a COVID vaccine choice any of the following: Go to [email or web address]   Go to [email or web address]                  Call [phone]                                     Call [phone] and select Option 2
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
## 2020-03-25 LAB — CERVICOVAGINAL ANCILLARY ONLY
Bacterial Vaginitis (gardnerella): POSITIVE — AB
Candida Glabrata: NEGATIVE
Candida Vaginitis: NEGATIVE
Chlamydia: NEGATIVE
Comment: NEGATIVE
Comment: NEGATIVE
Comment: NEGATIVE
Comment: NEGATIVE
Comment: NEGATIVE
Comment: NORMAL
Neisseria Gonorrhea: NEGATIVE
Trichomonas: NEGATIVE

## 2020-03-25 MED ORDER — METRONIDAZOLE 500 MG PO TABS: 500.0000 mg | ORAL_TABLET | Freq: Two times a day (BID) | ORAL | 0 refills | Status: AC

## 2020-03-25 NOTE — Progress Notes (Signed)
Internal Medicine Clinic Attending  I saw and evaluated the patient.  I personally confirmed the key portions of the history and exam documented by Dr. Coy Saunas and I reviewed pertinent patient test results.  The assessment, diagnosis, and plan were formulated together and I agree with the documentation in the resident's note. Several etiologies were considered for the episodic itching of her palms.  She did have an exacerbation of symptoms just before her pelvic exam - the palms were erythematous from being scratched vigorously - no lesions were evident, skin temperature remained warm, no swelling was visible.  She notes that while symptoms can occur at any time, they are common at work (no new chemicals, no skin contact with chemicals, no new gloves).  While she works in a stressful environment, she has not noted an association of her itching with stress, nor with change in temperature.  Palmar urticaria is possible, though no hives visible.  No evidence of dyshydrosis.  I assisted with pelvic exam and agree with Dr. Monte Fantasia findings.  No vaginal discharge present.

## 2020-03-25 NOTE — Addendum Note (Signed)
Addended byLinwood Dibbles on: 03/25/2020 05:10 PM   Modules accepted: Orders

## 2020-03-27 MED FILL — clonazePAM 0.5 MG TABS: 0.5 | 30 days supply | Qty: 35 | Fill #1

## 2020-04-28 MED FILL — clonazePAM 0.5 MG TABS: 0.5 | 30 days supply | Qty: 35 | Fill #2

## 2020-05-23 ENCOUNTER — Other Ambulatory Visit: Payer: Self-pay | Admitting: Internal Medicine

## 2020-05-23 DIAGNOSIS — F419 Anxiety disorder, unspecified: Secondary | ICD-10-CM

## 2020-05-26 ENCOUNTER — Other Ambulatory Visit: Payer: Self-pay | Admitting: Internal Medicine

## 2020-05-26 NOTE — Assessment & Plan Note (Signed)
Patient has chronic anxiety treated with zoloft 100 mg qd and klonopin bid prn. Last seen for anxiety in February, GAD7 at that time. Will place refill and have her f/u in clinic for repeat GAD and assessment.   - refill klonipin .5mg  bid prn

## 2020-05-27 NOTE — Telephone Encounter (Signed)
Just spoke with patient and she agreed to an appt with Dr. Gilford Rile for next Tuesday 06/03/2020 at 3:45 pm.

## 2020-05-28 MED FILL — clonazePAM 0.5 MG TABS: 0.5 | 30 days supply | Qty: 35 | Fill #0

## 2020-06-03 ENCOUNTER — Other Ambulatory Visit: Payer: Self-pay | Admitting: Internal Medicine

## 2020-06-03 ENCOUNTER — Other Ambulatory Visit: Payer: Self-pay

## 2020-06-03 ENCOUNTER — Ambulatory Visit (INDEPENDENT_AMBULATORY_CARE_PROVIDER_SITE_OTHER): Payer: 59 | Admitting: Internal Medicine

## 2020-06-03 ENCOUNTER — Encounter: Payer: Self-pay | Admitting: Internal Medicine

## 2020-06-03 DIAGNOSIS — M25511 Pain in right shoulder: Secondary | ICD-10-CM

## 2020-06-03 MED ORDER — IBUPROFEN 600 MG PO TABS: 600.0000 mg | ORAL_TABLET | Freq: Four times a day (QID) | ORAL | 0 refills | Status: DC | PRN

## 2020-06-03 MED ORDER — DICLOFENAC SODIUM 1 % EX GEL: 2.0000 g | Freq: Four times a day (QID) | CUTANEOUS | 2 refills | Status: DC

## 2020-06-03 MED FILL — IBUPROFEN 600 MG TABLET: 600 | 7 days supply | Qty: 28 | Fill #0

## 2020-06-03 MED FILL — DICLOFENAC SODIUM 1 % GEL: 1 | 25 days supply | Qty: 200 | Fill #0
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
## 2020-06-03 NOTE — Patient Instructions (Addendum)
To [name],  It was nice meeting you today! Today we discussed your shoulder pain. I have given you a gel for your shoulder pain. Please use it 4 times a day as needed. Additionally, I have given you 600 mg of ibuprofen. Please use the Ibuprofen for severe pain and as needed. Please continue to use ice on your shoulder. We will see you in 4-6 weeks for reevaluation. Have a happy holiday!  Sincerely,  Maudie Mercury, MD  A la [name], Fue un placer conocerte hoy! Hoy hablamos de su dolor de hombro. Te he dado un gel para el dolor de hombro. selo 4 veces al da segn sea necesario. Adems, le he dado 600 mg de ibuprofeno. Utilice ibuprofeno para el dolor intenso y segn sea necesario. Contine usando hielo en su hombro. Nos veremos en 4-6 semanas para una reevaluacin. Que tengas unas felices vacaciones! Atentamente, Dr. Maudie Mercury  [place] el traductor de [employer] para escribir este mensaje.

## 2020-06-04 ENCOUNTER — Encounter: Payer: Self-pay | Admitting: Internal Medicine

## 2020-06-04 DIAGNOSIS — M25511 Pain in right shoulder: Secondary | ICD-10-CM | POA: Insufficient documentation

## 2020-06-04 NOTE — Assessment & Plan Note (Signed)
Patient presents to the clinic with 4 weeks of shoulder pain. Patient did not experience any falls or trauma to the affected shoulder. 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 Pain is located in the R shoulder with no radiation. She states that the pain is "very hard to describe" without clear characterization. Her pain is aggravated by mopping, and use of the affected shoulder. She states that warm compresses and ice have not helped. She took 800 mg ibuprofen from a dental procedure, which did alleviate some of the pain.   A/P:  Physical exam findings and Hx are consistent with shoulder impingement secondary to repetitive movement. Discussed with patient conservative measures, such as resting the shoulder, ice, and NSAIDs. Patient voiced understanding. Discussed adverse effects of chronic NSAIDs use, and to use NSAIDs for refractory pain if conservative measures fail. Will treat conservatively.  - Voltaren 1% gel 2g 4 times daily as needed - Ibuprofen 600 mg Q6H PRN for severe pain - Continue ice compresses, and rest  - Follow up in 4-6 weeks for reassessment

## 2020-06-05 NOTE — Progress Notes (Signed)
Internal Medicine Clinic Attending ? ?Case discussed with Dr. Winters  At the time of the visit.  We reviewed the resident?s history and exam and pertinent patient test results.  I agree with the assessment, diagnosis, and plan of care documented in the resident?s note.  ?

## 2020-06-27 MED FILL — clonazePAM 0.5 MG TABS: 0.5 | 30 days supply | Qty: 35 | Fill #1

## 2020-07-02 ENCOUNTER — Other Ambulatory Visit: Payer: Self-pay

## 2020-07-02 ENCOUNTER — Ambulatory Visit (INDEPENDENT_AMBULATORY_CARE_PROVIDER_SITE_OTHER): Payer: 59 | Admitting: Internal Medicine

## 2020-07-02 DIAGNOSIS — J069 Acute upper respiratory infection, unspecified: Secondary | ICD-10-CM | POA: Diagnosis not present
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
## 2020-07-02 NOTE — Assessment & Plan Note (Addendum)
Ms Karen [name] endorses few days of itchy nose and throat. She endorses rhinorrhea for this duration and endorses that she has to constantly clear her throat. She endorses a mild nonproductive cough. She has received both COVID vaccines and booster shot. She denies any known sick contacts. She denies any fevers or chills or dyspnea. She endorses similar symptoms previously that were attributed to her allergies but did not take anything for them previously.   Plan: Recommended for COVID testing with quarantine and symptomatic treatment

## 2020-07-04 NOTE — Progress Notes (Signed)
Internal Medicine Clinic Attending ° °Case discussed with Dr. Aslam  At the time of the visit.  We reviewed the resident’s history and exam and pertinent patient test results.  I agree with the assessment, diagnosis, and plan of care documented in the resident’s note.  °

## 2020-07-24 ENCOUNTER — Other Ambulatory Visit: Payer: Self-pay | Admitting: Internal Medicine

## 2020-07-24 DIAGNOSIS — F419 Anxiety disorder, unspecified: Secondary | ICD-10-CM

## 2020-07-25 ENCOUNTER — Other Ambulatory Visit: Payer: Self-pay | Admitting: Student

## 2020-07-25 MED FILL — clonazePAM 0.5 MG TABS: 0.5 | 30 days supply | Qty: 30 | Fill #0

## 2020-07-25 NOTE — Telephone Encounter (Signed)
I will refill klonopin prn once. She needs to come in for an office visit to obtain GAD7 and reassessment for continued klonopin use as per Dr. Aurelio Jew note. Appears that she saw Dr. Gilford Rile, but GAD7 not performed at that time.

## 2020-08-22 MED FILL — clonazePAM 0.5 MG TABS: 0.5 | 30 days supply | Qty: 30 | Fill #1

## 2020-08-27 ENCOUNTER — Encounter: Payer: 59 | Admitting: Internal Medicine

## 2020-09-04 ENCOUNTER — Other Ambulatory Visit: Payer: Self-pay

## 2020-09-04 ENCOUNTER — Encounter (HOSPITAL_COMMUNITY): Payer: Self-pay | Admitting: Emergency Medicine

## 2020-09-04 ENCOUNTER — Emergency Department (HOSPITAL_COMMUNITY)
Admission: EM | Admit: 2020-09-04 | Discharge: 2020-09-05 | Disposition: A | Discharge status: Home / Self Care | Payer: 59 | Attending: Emergency Medicine | Admitting: Emergency Medicine

## 2020-09-04 DIAGNOSIS — R600 Localized edema: Secondary | ICD-10-CM | POA: Diagnosis not present

## 2020-09-04 DIAGNOSIS — R42 Dizziness and giddiness: Secondary | ICD-10-CM | POA: Diagnosis present

## 2020-09-04 DIAGNOSIS — I10 Essential (primary) hypertension: Secondary | ICD-10-CM | POA: Insufficient documentation

## 2020-09-04 DIAGNOSIS — G43809 Other migraine, not intractable, without status migrainosus: Secondary | ICD-10-CM | POA: Diagnosis not present

## 2020-09-04 DIAGNOSIS — R112 Nausea with vomiting, unspecified: Secondary | ICD-10-CM

## 2020-09-04 LAB — COMPREHENSIVE METABOLIC PANEL
ALT: 15 U/L (ref 0–44)
AST: 21 U/L (ref 15–41)
Albumin: 4.4 g/dL (ref 3.5–5.0)
Alkaline Phosphatase: 63 U/L (ref 38–126)
Anion gap: 10 (ref 5–15)
BUN: 10 mg/dL (ref 6–20)
CO2: 26 mmol/L (ref 22–32)
Calcium: 9.6 mg/dL (ref 8.9–10.3)
Chloride: 99 mmol/L (ref 98–111)
Creatinine, Ser: 0.7 mg/dL (ref 0.44–1.00)
GFR, Estimated: >60 mL/min (ref >60)
Glucose, Bld: 134 mg/dL — ABNORMAL HIGH (ref 70–99)
Potassium: 3.7 mmol/L (ref 3.5–5.1)
Sodium: 135 mmol/L (ref 135–145)
Total Bilirubin: 1.1 mg/dL (ref 0.3–1.2)
Total Protein: 7.5 g/dL (ref 6.5–8.1)

## 2020-09-04 LAB — CBC
HCT: 40.3 % (ref 36.0–46.0)
Hemoglobin: 13.5 g/dL (ref 12.0–15.0)
MCH: 30.8 pg (ref 26.0–34.0)
MCHC: 33.5 g/dL (ref 30.0–36.0)
MCV: 91.8 fL (ref 80.0–100.0)
Platelets: 321 10*3/uL (ref 150–400)
RBC: 4.39 MIL/uL (ref 3.87–5.11)
RDW: 12 % (ref 11.5–15.5)
WBC: 12 10*3/uL — ABNORMAL HIGH (ref 4.0–10.5)
nRBC: 0 % (ref 0.0–0.2)

## 2020-09-04 NOTE — ED Triage Notes (Signed)
Patient with history of migraines complains of headache without focal neuro deficit since Sunday. States she took imitrex but vomited it back up. Patient alert, oriented, and in no apparent distress at this time.

## 2020-09-05 ENCOUNTER — Emergency Department (HOSPITAL_COMMUNITY): Payer: 59

## 2020-09-05 ENCOUNTER — Other Ambulatory Visit (HOSPITAL_COMMUNITY): Payer: Self-pay | Admitting: Emergency Medicine

## 2020-09-05 MED ORDER — ONDANSETRON 4 MG PO TBDP: 4.0000 mg | ORAL_TABLET | Freq: Three times a day (TID) | ORAL | 0 refills | Status: DC | PRN

## 2020-09-05 MED ORDER — PROCHLORPERAZINE EDISYLATE 10 MG/2ML IJ SOLN
10.0000 mg | Freq: Once | INTRAMUSCULAR | Status: AC
  Administered 2020-09-05: 10 mg via INTRAVENOUS
  Filled 2020-09-05: qty 2

## 2020-09-05 MED ORDER — DIPHENHYDRAMINE HCL 50 MG/ML IJ SOLN
25.0000 mg | Freq: Once | INTRAMUSCULAR | Status: AC
  Administered 2020-09-05: 25 mg via INTRAVENOUS
  Filled 2020-09-05: qty 1

## 2020-09-05 MED ORDER — SODIUM CHLORIDE 0.9 % IV BOLUS
1000.0000 mL | Freq: Once | INTRAVENOUS | Status: AC
  Administered 2020-09-05: 1000 mL via INTRAVENOUS

## 2020-09-05 NOTE — Discharge Instructions (Addendum)
Gracias por permitirme atenderlo [employer] [facility].  Hoy te trataron con un cctel para la migraa. Puede reanudar su sumatriptn en casa segn lo prescrito.  Deje que 1 tableta de Zofran se disuelva en su lengua cada 8 horas segn sea necesario para las nuseas o los vmitos.  Si su dolor de cabeza regresa, haga un seguimiento con atencin primaria.  Regrese al departamento de emergencias si no puede caminar, si presenta vmitos incontrolables a pesar de tomar Zofran, si presenta entumecimiento o debilidad nuevos, u otros sntomas nuevos y preocupantes.   Thank you for allowing me to care for you today in the Emergency Department.   You were treated today with a migraine cocktail.  You can resume your home sumatriptan as prescribed.  Let 1 tablet of Zofran dissolve in your tongue every 8 hours as needed for nausea or vomiting.  If your headache returns, please follow-up with primary care.  Return to the emergency department if you become unable to walk, if you develop uncontrollable vomiting despite taking Zofran, if you develop new numbness or weakness, or other new, concerning symptoms

## 2020-09-05 NOTE — ED Notes (Signed)
Pt verbalized understanding of d/c instructions, follow up and medications. Pt provided work note and taken to [place] via [place]. NAD.
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
## 2020-09-17 ENCOUNTER — Other Ambulatory Visit: Payer: Self-pay | Admitting: *Deleted

## 2020-09-17 DIAGNOSIS — F419 Anxiety disorder, unspecified: Secondary | ICD-10-CM

## 2020-09-17 NOTE — Telephone Encounter (Signed)
Call from pt (with interpreter on the line) Requesting refill on clonazepam Will also be traveling to [place] May 1st-14th and would like to have refills avail to take with her.  Will send request MD for review.  Please advise.      (any return call to patient must be made with interpreter on line)

## 2020-09-18 ENCOUNTER — Other Ambulatory Visit: Payer: Self-pay | Admitting: Internal Medicine

## 2020-09-18 MED ORDER — CLONAZEPAM 0.5 MG PO TABS: ORAL_TABLET | ORAL | 0 refills | Status: DC

## 2020-09-19 MED FILL — clonazePAM 0.5 MG TABS: 0.5 | 15 days supply | Qty: 30 | Fill #0

## 2020-09-30 ENCOUNTER — Ambulatory Visit (INDEPENDENT_AMBULATORY_CARE_PROVIDER_SITE_OTHER): Payer: 59 | Admitting: Internal Medicine

## 2020-09-30 ENCOUNTER — Other Ambulatory Visit: Payer: Self-pay

## 2020-09-30 VITALS — BP 130/78 | HR 57 | Temp 98.2°F | Ht 62.0 in | Wt 132.0 lb

## 2020-09-30 DIAGNOSIS — M5416 Radiculopathy, lumbar region: Secondary | ICD-10-CM | POA: Diagnosis not present

## 2020-09-30 DIAGNOSIS — F419 Anxiety disorder, unspecified: Secondary | ICD-10-CM | POA: Diagnosis not present

## 2020-09-30 NOTE — Patient Instructions (Signed)
Thank you, [name] for allowing [place] to provide your care today. Today we discussed anxiety and back pain.    I have ordered the following labs for you:  Interventional radiology for spinal injections. Integrated behavioral health for counseling with Dr. Theodis [name].  Tests ordered today:  none  Referrals ordered today:   Referral Orders  No referral(s) requested today     Medication Changes:   There are no discontinued medications.   No orders of the defined types were placed in this encounter.    Follow up: with your PCP in 1-3 months.   Remember:    Should you have any questions or concerns please call the internal medicine clinic at [phone].     Karen [name], D.O. [facility]
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
## 2020-10-02 ENCOUNTER — Encounter: Payer: Self-pay | Admitting: Internal Medicine

## 2020-10-02 ENCOUNTER — Other Ambulatory Visit (HOSPITAL_COMMUNITY): Payer: Self-pay

## 2020-10-02 MED ORDER — CLONAZEPAM 0.5 MG PO TABS
0.5000 mg | ORAL_TABLET | Freq: Two times a day (BID) | ORAL | 0 refills | Status: DC
  Filled 2020-10-02 – 2020-10-15 (×2): qty 30, 15d supply, fill #0

## 2020-10-02 NOTE — Assessment & Plan Note (Signed)
Patient has long standing lumbar back pain. She was previously managed my an IR and was receiving steroid injections twice annually. She states that she has not been back to see the doctor in over a year and has noticed that her pain has become much worse. She admits to radicular symptoms bilaterally that is worse of the right. She denies any red flag symptoms such as saddle anesthesia or bowel/bladder incontinence.   Previous imaging in 2016 showed: Multilevel lumbar and lower thoracic disc herniations, the largest is at L4-L5 and contributes to left greater than right lateral recess stenosis, foraminal stenosis, and mild overall spinal stenosis. Query L4 or L5 radiculitis.  Assessment: Bilateral lumbar back pain with radiculopathy 2/2 degenerative disc disease and disc herniation.   Plan: 1. Refer to IR for repeat steroid injections. 2. Continue to use OTC meds for analgesia - she was counseled on using tylenol 650 mg q4-6 hours and ibuprofen 400 mg q6 hours.

## 2020-10-02 NOTE — Assessment & Plan Note (Addendum)
Patient has a history of anxiety on sertraline and clonazepam. Look back through her notes, it appears the the clonazepam was originally starts as a means of bridging her to a more sustainable medications. She has also been resistant to starting counseling.   Assessment: Patient has anxiety with panic attacks, but unsure if she meets the diagnosis of GAD.   Plan: 1. Counseled regarding the use of benzodiazepines. Discussed the need to titrate off of this medication on to something safer and more sustainable.   2. Referred patient for counseling with Dr. Theodis Shove.  3. Will refill her clonazepam. Patient states that she is going to [place] on the 1st of May. Therefore, I will refill her prescription a day early.

## 2020-10-05 NOTE — Progress Notes (Signed)
Internal Medicine Clinic Attending  Case discussed with Dr. Coe  At the time of the visit.  We reviewed the resident's history and exam and pertinent patient test results.  I agree with the assessment, diagnosis, and plan of care documented in the resident's note.  

## 2020-10-07 ENCOUNTER — Telehealth: Payer: Self-pay | Admitting: Internal Medicine

## 2020-10-07 NOTE — Telephone Encounter (Signed)
Hello,  Orders are in. Thank you!

## 2020-10-07 NOTE — Addendum Note (Signed)
Addended by: Lawerance Cruel on: 10/07/2020 04:38 PM   Modules accepted: Orders

## 2020-10-07 NOTE — Telephone Encounter (Signed)
Patient called with with [place] [employer]/ Garciella on the phone in reference to beign sch at [facility] for a Spinal Injection.  Please advise if you were to place an order for this patient's Request.

## 2020-10-10 ENCOUNTER — Other Ambulatory Visit (HOSPITAL_COMMUNITY): Payer: Self-pay

## 2020-10-13 ENCOUNTER — Other Ambulatory Visit: Payer: Self-pay | Admitting: Internal Medicine

## 2020-10-13 DIAGNOSIS — M545 Low back pain, unspecified: Secondary | ICD-10-CM

## 2020-10-13 DIAGNOSIS — G8929 Other chronic pain: Secondary | ICD-10-CM

## 2020-10-15 ENCOUNTER — Encounter: Payer: Self-pay | Admitting: Internal Medicine

## 2020-10-15 ENCOUNTER — Other Ambulatory Visit (HOSPITAL_COMMUNITY): Payer: Self-pay

## 2020-10-17 ENCOUNTER — Other Ambulatory Visit (HOSPITAL_COMMUNITY): Payer: Self-pay

## 2020-11-05 ENCOUNTER — Other Ambulatory Visit: Payer: Self-pay

## 2020-11-05 ENCOUNTER — Ambulatory Visit: Payer: 59 | Admitting: Behavioral Health

## 2020-11-05 DIAGNOSIS — F419 Anxiety disorder, unspecified: Secondary | ICD-10-CM

## 2020-11-05 DIAGNOSIS — F331 Major depressive disorder, recurrent, moderate: Secondary | ICD-10-CM
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
## 2020-11-06 ENCOUNTER — Other Ambulatory Visit (HOSPITAL_BASED_OUTPATIENT_CLINIC_OR_DEPARTMENT_OTHER): Payer: Self-pay

## 2020-11-11 ENCOUNTER — Ambulatory Visit
Admission: RE | Admit: 2020-11-11 | Discharge: 2020-11-11 | Disposition: A | Discharge status: Home / Self Care | Payer: 59 | Source: Ambulatory Visit | Attending: Internal Medicine | Admitting: Internal Medicine

## 2020-11-11 ENCOUNTER — Other Ambulatory Visit: Payer: 59

## 2020-11-11 ENCOUNTER — Other Ambulatory Visit: Payer: Self-pay | Admitting: Internal Medicine

## 2020-11-11 DIAGNOSIS — G8929 Other chronic pain: Secondary | ICD-10-CM

## 2020-11-11 DIAGNOSIS — M545 Low back pain, unspecified: Secondary | ICD-10-CM

## 2020-11-11 MED ORDER — IOPAMIDOL (ISOVUE-M 200) INJECTION 41%
1.0000 mL | Freq: Once | INTRAMUSCULAR | Status: AC
  Administered 2020-11-11: 1 mL via EPIDURAL

## 2020-11-11 MED ORDER — METHYLPREDNISOLONE ACETATE 40 MG/ML INJ SUSP (RADIOLOG
80.0000 mg | Freq: Once | INTRAMUSCULAR | Status: AC
  Administered 2020-11-11: 80 mg via EPIDURAL

## 2020-11-11 NOTE — Discharge Instructions (Signed)
Post Procedure Spinal Discharge Instruction Sheet  1. You may resume a regular diet and any medications that you routinely take (including pain medications).  2. No driving day of procedure.  3. Light activity throughout the rest of the day.  Do not do any strenuous work, exercise, bending or lifting.  The day following the procedure, you can resume normal physical activity but you should refrain from exercising or physical therapy for at least three days thereafter.   Common Side Effects:   Headaches- take your usual medications as directed by your physician.  Increase your fluid intake.  Caffeinated beverages may be helpful.  Lie flat in bed until your headache resolves.   Restlessness or inability to sleep- you may have trouble sleeping for the next few days.  Ask your referring physician if you need any medication for sleep.   Facial flushing or redness- should subside within a few days.   Increased pain- a temporary increase in pain a day or two following your procedure is not unusual.  Take your pain medication as prescribed by your referring physician.   Leg cramps  Please contact our office at [phone] for the following symptoms:  Fever greater than 100 degrees.  Headaches unresolved with medication after 2-3 days.  Increased swelling, pain, or redness at injection site.   Thank you for visiting [facility] today.  

## 2020-11-12 ENCOUNTER — Other Ambulatory Visit (HOSPITAL_COMMUNITY): Payer: Self-pay

## 2020-11-12 ENCOUNTER — Other Ambulatory Visit: Payer: Self-pay | Admitting: Internal Medicine

## 2020-11-12 DIAGNOSIS — F419 Anxiety disorder, unspecified: Secondary | ICD-10-CM

## 2020-11-13 ENCOUNTER — Other Ambulatory Visit (HOSPITAL_COMMUNITY): Payer: Self-pay

## 2020-11-13 MED ORDER — CLONAZEPAM 0.5 MG PO TABS
0.5000 mg | ORAL_TABLET | Freq: Two times a day (BID) | ORAL | 0 refills | Status: DC
  Filled 2020-11-13: qty 30, 15d supply, fill #0

## 2020-11-14 ENCOUNTER — Other Ambulatory Visit (HOSPITAL_COMMUNITY): Payer: Self-pay

## 2020-11-26 ENCOUNTER — Ambulatory Visit: Payer: 59 | Admitting: Behavioral Health

## 2020-11-26 ENCOUNTER — Other Ambulatory Visit: Payer: Self-pay

## 2020-11-26 DIAGNOSIS — F419 Anxiety disorder, unspecified: Secondary | ICD-10-CM

## 2020-11-26 DIAGNOSIS — F331 Major depressive disorder, recurrent, moderate: Secondary | ICD-10-CM

## 2020-11-26 DIAGNOSIS — Z639 Problem related to primary support group, unspecified: Secondary | ICD-10-CM
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
## 2020-12-12 ENCOUNTER — Other Ambulatory Visit: Payer: Self-pay | Admitting: Internal Medicine

## 2020-12-12 ENCOUNTER — Other Ambulatory Visit (HOSPITAL_COMMUNITY): Payer: Self-pay

## 2020-12-12 DIAGNOSIS — F419 Anxiety disorder, unspecified: Secondary | ICD-10-CM

## 2020-12-15 ENCOUNTER — Other Ambulatory Visit: Payer: Self-pay | Admitting: Internal Medicine

## 2020-12-15 ENCOUNTER — Other Ambulatory Visit (HOSPITAL_COMMUNITY): Payer: Self-pay

## 2020-12-15 DIAGNOSIS — F419 Anxiety disorder, unspecified: Secondary | ICD-10-CM

## 2020-12-15 NOTE — Telephone Encounter (Signed)
clonazePAM (KLONOPIN) 0.5 MG tablet, refill request @  [facility] Phone:  [phone]  Fax:  [phone]

## 2020-12-16 ENCOUNTER — Ambulatory Visit: Payer: 59 | Admitting: Behavioral Health

## 2020-12-16 ENCOUNTER — Other Ambulatory Visit (HOSPITAL_COMMUNITY): Payer: Self-pay

## 2020-12-16 DIAGNOSIS — F331 Major depressive disorder, recurrent, moderate: Secondary | ICD-10-CM

## 2020-12-16 DIAGNOSIS — F419 Anxiety disorder, unspecified: Secondary | ICD-10-CM

## 2020-12-16 MED ORDER — CLONAZEPAM 0.5 MG PO TABS
0.5000 mg | ORAL_TABLET | Freq: Two times a day (BID) | ORAL | 0 refills | Status: DC
  Filled 2020-12-16: qty 30, 15d supply, fill #0

## 2020-12-16 NOTE — Telephone Encounter (Signed)
Pt is calling again about her medicine clonazePAM (KLONOPIN) 0.5 MG tablet,
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
## 2020-12-23 ENCOUNTER — Encounter: Payer: Self-pay | Admitting: *Deleted

## 2021-01-12 ENCOUNTER — Other Ambulatory Visit: Payer: Self-pay | Admitting: Internal Medicine

## 2021-01-12 ENCOUNTER — Other Ambulatory Visit (HOSPITAL_COMMUNITY): Payer: Self-pay

## 2021-01-12 DIAGNOSIS — F419 Anxiety disorder, unspecified: Secondary | ICD-10-CM

## 2021-01-12 NOTE — Telephone Encounter (Signed)
clonazePAM (KLONOPIN) 0.5 MG tablet (Expired, REFILL REQUEST @ [facility].

## 2021-01-13 ENCOUNTER — Other Ambulatory Visit (HOSPITAL_COMMUNITY): Payer: Self-pay

## 2021-01-13 ENCOUNTER — Ambulatory Visit: Payer: 59 | Admitting: Behavioral Health

## 2021-01-13 ENCOUNTER — Other Ambulatory Visit: Payer: Self-pay

## 2021-01-13 DIAGNOSIS — F331 Major depressive disorder, recurrent, moderate: Secondary | ICD-10-CM

## 2021-01-13 DIAGNOSIS — Z634 Disappearance and death of family member: Secondary | ICD-10-CM

## 2021-01-13 DIAGNOSIS — F419 Anxiety disorder, unspecified: Secondary | ICD-10-CM

## 2021-01-13 DIAGNOSIS — F4321 Adjustment disorder with depressed mood: Secondary | ICD-10-CM

## 2021-01-13 MED ORDER — CLONAZEPAM 0.5 MG PO TABS: 0.5000 mg | ORAL_TABLET | Freq: Two times a day (BID) | ORAL | 0 refills | Status: DC

## 2021-01-13 NOTE — Telephone Encounter (Signed)
Pt's here at the clinic; asking about refill on Klonopin. States she will be out of med by tomorrow. Requesting to change to [facility] pharmacy on [address] which is closer to her home.
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
## 2021-01-27 ENCOUNTER — Telehealth: Payer: Self-pay | Admitting: Internal Medicine

## 2021-01-27 NOTE — Telephone Encounter (Signed)
Return pt's call who stated she's having constant chest pain; stated this has been going on for a "long time". Hx chronic chest pain. Pt was asked if she has any arm pain - she stated yes. Informed pt to go to the ED - she stated "Oh no, had for long time". She's currently at work. No available appts today.  Appt given for tomorrow 01/28/21 with Dr Marianna Payment @ 1415 PM, first available. Instructed pt to go to the ED if CP worsens - stated she understands. Please advise  - let me know if this plan is not appropriate.

## 2021-01-27 NOTE — Telephone Encounter (Signed)
Please call the patient back.  Pt states she's having a "little chest" pain.

## 2021-01-27 NOTE — Telephone Encounter (Signed)
That is fine,t hank you.

## 2021-01-28 ENCOUNTER — Ambulatory Visit (INDEPENDENT_AMBULATORY_CARE_PROVIDER_SITE_OTHER): Payer: 59 | Admitting: Student

## 2021-01-28 ENCOUNTER — Encounter: Payer: Self-pay | Admitting: Student

## 2021-01-28 VITALS — BP 140/79 | HR 55 | Temp 98.4°F | Wt 126.1 lb

## 2021-01-28 DIAGNOSIS — R0789 Other chest pain: Secondary | ICD-10-CM | POA: Diagnosis not present
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
## 2021-01-28 NOTE — Assessment & Plan Note (Addendum)
Assessment: Patient with longstanding history of atypical chest pain.  She had a negative stress test in 2011.  Patient states for the past year she is continue to have the chest pain.  She states that it is central in location, denies radiation up her neck or down her arms.  She states the pain is a pressure-like sensation.  She states that it is intermittent for variable amounts of time.  The pain improves for short period of time when the patient takes long deep breaths and is worsened when she is upset, angry, or stressed.  She has associated symptoms of nausea and at times can be short of breath.  The pain can occur when she is exerting herself or sitting down resting comfortably.  Pain is not reproducible with palpation.  Physical exam was unremarkable, normal heart sounds.  No signs of hypervolemia.  Prior cardiac work-up was unremarkable.  Most recent EKG from 2021  unremarkable and without any acute abnormalities.  I have a low suspicion that this is cardiac in nature however with the patient's persistent chest pain and as she continues to age, believe she could benefit from a stress test.  We will order a exercise stress test for the patient to perform.  She has no difficulty with ambulation.  Differential diagnosis includes panic attack, costochondritis is less likely as pain is not reproducible.  Plan: -Cardiac stress test ordered -Patient given instructions to go to closest emergency department if pain persists and she has shortness of breath

## 2021-01-28 NOTE — Patient Instructions (Signed)
Clayburn Pert, Ms.[name] por permitirnos brindarle atencin hoy. hoy discutimos  Dolor de [employer] orden para repetir una prueba de esfuerzo. Deben llamarlo para programar esto, si no recibe una llamada para programar esto en las prximas semanas, llame a nuestra clnica.  Si tiene Licensed conveyancer y no desaparece o si nota otros sntomas, Adult nurse que se irradia [employer]. Dirjase a la sala de emergencias ms cercana y, si no est seguro, llame a nuestra clnica.  He ordenado los siguientes laboratorios para usted:  Lab Orders  No laboratory test(s) ordered today     Pruebas ordenadas hoy:  Referencias ordenadas hoy:  Referral Orders  No referral(s) requested today     He ordenado el siguiente medicamento/cambiado los siguientes medicamentos:  Suspender los siguientes medicamentos: There are no discontinued medications.   Iniciar los siguientes medicamentos: No orders of the defined types were placed in this encounter.     Si tiene alguna pregunta o inquietud, llame a la clnica de medicina interna al [phone].     Sanjuana Letters, D.O. [place]

## 2021-02-04 ENCOUNTER — Ambulatory Visit: Payer: 59 | Admitting: Behavioral Health

## 2021-02-04 DIAGNOSIS — F331 Major depressive disorder, recurrent, moderate: Secondary | ICD-10-CM

## 2021-02-04 DIAGNOSIS — F419 Anxiety disorder, unspecified: Secondary | ICD-10-CM
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
## 2021-02-09 ENCOUNTER — Other Ambulatory Visit: Payer: Self-pay | Admitting: Internal Medicine

## 2021-02-09 DIAGNOSIS — F419 Anxiety disorder, unspecified: Secondary | ICD-10-CM

## 2021-02-09 NOTE — Progress Notes (Signed)
Internal Medicine Clinic Attending  Case discussed with Dr. Katsadouros  At the time of the visit.  We reviewed the resident's history and exam and pertinent patient test results.  I agree with the assessment, diagnosis, and plan of care documented in the resident's note.  

## 2021-02-09 NOTE — Telephone Encounter (Signed)
Refill Request-  clonazePAM (KLONOPIN) 0.5 MG tablet (Expired)   [id number] [place] [place] (Ph: [phone])  Order Details

## 2021-02-12 ENCOUNTER — Telehealth: Payer: Self-pay | Admitting: Student

## 2021-02-12 NOTE — Addendum Note (Signed)
Addended by: Riesa Pope on: 02/12/2021 01:32 PM   Modules accepted: Orders

## 2021-02-12 NOTE — Telephone Encounter (Signed)
Rec'd  a call from the [facility] for this patient asking about an order to be placed to for a Treadmill Test.  Please advise if you can correct and place a new order for Cardiology. They will be able to get this patient scheduled as soon as possible.

## 2021-02-12 NOTE — Telephone Encounter (Signed)
clonazePAM (KLONOPIN) 0.5 MG tablet (Expired, refill request @ [id number] - [place], [place] - [place].

## 2021-02-13 ENCOUNTER — Encounter (HOSPITAL_COMMUNITY): Payer: Self-pay | Admitting: *Deleted

## 2021-02-13 ENCOUNTER — Other Ambulatory Visit: Payer: Self-pay | Admitting: Internal Medicine

## 2021-02-13 ENCOUNTER — Other Ambulatory Visit (HOSPITAL_COMMUNITY): Payer: Self-pay

## 2021-02-13 ENCOUNTER — Other Ambulatory Visit: Payer: Self-pay

## 2021-02-13 ENCOUNTER — Ambulatory Visit (HOSPITAL_COMMUNITY)
Admission: EM | Admit: 2021-02-13 | Discharge: 2021-02-13 | Disposition: A | Discharge status: Home / Self Care | Payer: 59 | Attending: Medical Oncology | Admitting: Medical Oncology

## 2021-02-13 DIAGNOSIS — R0789 Other chest pain: Secondary | ICD-10-CM

## 2021-02-13 DIAGNOSIS — F419 Anxiety disorder, unspecified: Secondary | ICD-10-CM

## 2021-02-13 MED ORDER — CLONAZEPAM 0.5 MG PO TABS: 0.5000 mg | ORAL_TABLET | Freq: Every day | ORAL | 0 refills | Status: DC

## 2021-02-13 MED ORDER — TIZANIDINE HCL 4 MG PO TABS: 4.0000 mg | ORAL_TABLET | Freq: Four times a day (QID) | ORAL | 0 refills | Status: DC | PRN

## 2021-02-13 NOTE — ED Triage Notes (Signed)
Pt reports CP and HA for 4 months . Pt waiting for stress test.

## 2021-02-13 NOTE — ED Triage Notes (Signed)
PT wants refill on Klonopin
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
## 2021-02-16 ENCOUNTER — Other Ambulatory Visit: Payer: Self-pay | Admitting: Internal Medicine

## 2021-02-16 ENCOUNTER — Other Ambulatory Visit (HOSPITAL_COMMUNITY): Payer: Self-pay

## 2021-02-16 DIAGNOSIS — F419 Anxiety disorder, unspecified: Secondary | ICD-10-CM

## 2021-02-16 MED ORDER — CLONAZEPAM 0.5 MG PO TABS: 0.5000 mg | ORAL_TABLET | Freq: Two times a day (BID) | ORAL | 0 refills | Status: DC

## 2021-02-16 NOTE — Telephone Encounter (Signed)
Patient calling again regarding this refill. It was sent to pharmacy at 0709 this AM. Patient notified and is very appreciative.

## 2021-03-04 ENCOUNTER — Ambulatory Visit: Payer: 59 | Admitting: Behavioral Health

## 2021-03-04 DIAGNOSIS — F331 Major depressive disorder, recurrent, moderate: Secondary | ICD-10-CM

## 2021-03-04 DIAGNOSIS — F419 Anxiety disorder, unspecified: Secondary | ICD-10-CM

## 2021-03-04 DIAGNOSIS — Z9189 Other specified personal risk factors, not elsewhere classified: Secondary | ICD-10-CM
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
## 2021-03-17 ENCOUNTER — Other Ambulatory Visit: Payer: Self-pay | Admitting: Internal Medicine

## 2021-03-17 DIAGNOSIS — F419 Anxiety disorder, unspecified: Secondary | ICD-10-CM

## 2021-03-19 NOTE — Telephone Encounter (Signed)
Patient calling back about refill on alprazolam.

## 2021-03-26 ENCOUNTER — Ambulatory Visit: Payer: 59 | Admitting: Behavioral Health

## 2021-03-26 DIAGNOSIS — F419 Anxiety disorder, unspecified: Secondary | ICD-10-CM

## 2021-03-26 DIAGNOSIS — F331 Major depressive disorder, recurrent, moderate: Secondary | ICD-10-CM
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
## 2021-04-15 ENCOUNTER — Ambulatory Visit: Payer: 59 | Admitting: Behavioral Health

## 2021-04-15 ENCOUNTER — Other Ambulatory Visit: Payer: Self-pay

## 2021-04-15 DIAGNOSIS — F419 Anxiety disorder, unspecified: Secondary | ICD-10-CM

## 2021-04-15 DIAGNOSIS — F331 Major depressive disorder, recurrent, moderate: Secondary | ICD-10-CM

## 2021-04-15 NOTE — BH Specialist Note (Signed)
Integrated Behavioral Health via Telemedicine Visit  04/15/2021 Karen [name] [id number]  Number of Integrated Behavioral Health visits: 8 Session Start time: 2:45pm  Session End time: 3:15pm Total time: 30  Referring Provider: Dr. Marlane Mingle, DO Patient/Family location: Pt is home in private [facility] Provider location: [facility] Office All persons participating in visit: Pt & Clinician Types of Service: Individual psychotherapy  I connected with [name] and/or [name]'s  self  via  Telephone or Video Enabled Telemedicine Application  (Video is Caregility application) and verified that I am speaking with the correct person using two identifiers. Discussed confidentiality:  8th visit  I discussed the limitations of telemedicine and the availability of in person appointments.  Discussed there is a possibility of technology failure and discussed alternative modes of communication if that failure occurs.  I discussed that engaging in this telemedicine visit, they consent to the provision of behavioral healthcare and the services will be billed under their insurance.  Patient and/or legal guardian expressed understanding and consented to Telemedicine visit:  8th visit  Presenting Concerns: Patient and/or family reports the following symptoms/concerns: reduced anx/dep that cont's to dec bc her Partner is in psychotherapy & trying to improve his jealousy & mistrust issues Duration of problem: months; Severity of problem: moderate  Patient and/or Family's Strengths/Protective Factors: Social and Emotional competence, Concrete supports in place (healthy food, safe environments, etc.), Sense of purpose, and Physical Health (exercise, healthy diet, medication compliance, etc.)  Goals Addressed: Patient will:  Reduce symptoms of: anxiety, depression, and stress   Increase knowledge and/or ability of: coping skills and stress reduction   Demonstrate ability to:  Increase healthy adjustment to current life circumstances  Progress towards Goals: Ongoing  Interventions: Interventions utilized:  Solution-Focused Strategies and Supportive Counseling Standardized Assessments completed:  screeners prn  Patient and/or Family Response: Pt is receptive to call taken early; Pt just home from work. Pt requests future appts  Assessment: Patient currently experiencing elevated levels of mental fatigue & she is sad @ times.   Patient may benefit from cont'd support for her Px & mental health wellness.  Plan: Follow up with behavioral health clinician on : 2-3 wks on telehealth for 30 min Behavioral recommendations: Advocate for self w/the Stress Test tommorow; ask questions & see if exercise outside work is feasible Referral(s): [place] (In Clinic)  I discussed the assessment and treatment plan with the patient and/or parent/guardian. They were provided an opportunity to ask questions and all were answered. They agreed with the plan and demonstrated an understanding of the instructions.   They were advised to call back or seek an in-person evaluation if the symptoms worsen or if the condition fails to improve as anticipated.  Donnetta Hutching, LMFT

## 2021-04-15 NOTE — Assessment & Plan Note (Addendum)
I will refer the patient for a CTA and echocardiogram.  She has had multiple previous stress tests which have been negative.  If she has no obstructive disease and I believe her chest pain is likely due to noncardiac etiology perhaps related to psychosocial stessors.  I will see her back in 2 months time but if these tests are negative, we can cancel this appointment as she has difficulties getting time off from work.  I could then see her on a as needed basis.
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
## 2021-04-16 ENCOUNTER — Encounter: Payer: Self-pay | Admitting: Internal Medicine

## 2021-04-16 ENCOUNTER — Other Ambulatory Visit: Payer: Self-pay

## 2021-04-16 ENCOUNTER — Ambulatory Visit (INDEPENDENT_AMBULATORY_CARE_PROVIDER_SITE_OTHER): Payer: 59 | Admitting: Internal Medicine

## 2021-04-16 VITALS — BP 138/82 | HR 51 | Ht 63.0 in | Wt 124.8 lb

## 2021-04-16 DIAGNOSIS — Z01812 Encounter for preprocedural laboratory examination: Secondary | ICD-10-CM

## 2021-04-16 DIAGNOSIS — R072 Precordial pain: Secondary | ICD-10-CM

## 2021-04-16 DIAGNOSIS — R0789 Other chest pain: Secondary | ICD-10-CM | POA: Diagnosis not present

## 2021-04-16 DIAGNOSIS — F339 Major depressive disorder, recurrent, unspecified: Secondary | ICD-10-CM

## 2021-04-16 LAB — BASIC METABOLIC PANEL
BUN/Creatinine Ratio: 16 (ref 9–23)
BUN: 12 mg/dL (ref 6–24)
CO2: 23 mmol/L (ref 20–29)
Calcium: 9.3 mg/dL (ref 8.7–10.2)
Chloride: 102 mmol/L (ref 96–106)
Creatinine, Ser: 0.75 mg/dL (ref 0.57–1.00)
Glucose: 88 mg/dL (ref 70–99)
Potassium: 4.4 mmol/L (ref 3.5–5.2)
Sodium: 138 mmol/L (ref 134–144)
eGFR: 94 mL/min/{1.73_m2} (ref >59)

## 2021-04-16 NOTE — Assessment & Plan Note (Signed)
This is being followed by other providers.  She seems relatively calm and at ease today.

## 2021-04-16 NOTE — Patient Instructions (Addendum)
Medication Instructions:  No changes today *If you need a refill on your cardiac medications before your next appointment, please call your pharmacy*   Lab Work: Today: BMET  If you have labs (blood work) drawn today and your tests are completely normal, you will receive your results only by: [place] (if you have MyChart) OR A paper copy in the mail If you have any lab test that is abnormal or we need to change your treatment, we will call you to review the results.   Testing/Procedures: Your physician has requested that you have an echocardiogram. Echocardiography is a painless test that uses sound waves to create images of your heart. It provides your doctor with information about the size and shape of your heart and how well your heart's chambers and valves are working. This procedure takes approximately one hour. There are no restrictions for this procedure.  Cardiac CTA - see instructions below.   Follow-Up: At [facility], you and your health needs are our priority.  As part of our continuing mission to provide you with exceptional heart care, we have created designated Provider Care Teams.  These Care Teams include your primary Cardiologist (physician) and Advanced Practice Providers (APPs -  Physician Assistants and Nurse Practitioners) who all work together to provide you with the care you need, when you need it.    Please plan to return in 2 months.  If testing is normal, we can cancel this and you can be seen as needed in the future  Other Instructions   Your cardiac CT will be scheduled at one of the below locations:   [facility] [address] [place], [place] [address] [phone]  Please arrive at the [facility] main entrance (entrance A) of [facility] 30 minutes prior to test start time. You can use the FREE valet parking offered at the main entrance (encouraged to control the heart rate for the test) Proceed to the [facility]  Radiology Department (first floor) to check-in and test prep.   Please follow these instructions carefully (unless otherwise directed):   On the Night Before the Test: Be sure to Drink plenty of water. Do not consume any caffeinated/decaffeinated beverages or chocolate 12 hours prior to your test. Do not take any antihistamines 12 hours prior to your test.  On the Day of the Test: Drink plenty of water until 1 hour prior to the test. Do not eat any food 4 hours prior to the test. You may take your regular medications prior to the test.  FEMALES- please wear underwire-free bra if available, avoid dresses & tight clothing       After the Test: Drink plenty of water. After receiving IV contrast, you may experience a mild flushed feeling. This is normal. On occasion, you may experience a mild rash up to 24 hours after the test. This is not dangerous. If this occurs, you can take Benadryl 25 mg and increase your fluid intake. If you experience trouble breathing, this can be serious. If it is severe call 911 IMMEDIATELY. If it is mild, please call our office. If you take any of these medications: Glipizide/Metformin, Avandament, Glucavance, please do not take 48 hours after completing test unless otherwise instructed.  Please allow 2-4 weeks for scheduling of routine cardiac CTs. Some insurance companies require a pre-authorization which may delay scheduling of this test.   For non-scheduling related questions, please contact the cardiac imaging nurse navigator should you have any questions/concerns: Clarise Cruz  Juleen China, Cardiac Imaging Nurse Navigator Gordy Clement, Cardiac Imaging Nurse Navigator [place] [facility] Direct Office Dial: [phone]   For scheduling needs, including cancellations and rescheduling, please call [place], [phone].
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
## 2021-04-20 ENCOUNTER — Other Ambulatory Visit: Payer: Self-pay | Admitting: Internal Medicine

## 2021-04-20 DIAGNOSIS — F419 Anxiety disorder, unspecified: Secondary | ICD-10-CM

## 2021-04-20 MED ORDER — CLONAZEPAM 0.5 MG PO TABS: 0.5000 mg | ORAL_TABLET | Freq: Every day | ORAL | 0 refills | Status: DC | PRN

## 2021-04-20 NOTE — Telephone Encounter (Signed)
Refill Request   clonazePAM (KLONOPIN) 0.5 MG tablet   [id number] [place], [place] - [place] (Ph: [phone])  Order Details

## 2021-04-23 ENCOUNTER — Other Ambulatory Visit: Payer: Self-pay

## 2021-04-23 ENCOUNTER — Ambulatory Visit (INDEPENDENT_AMBULATORY_CARE_PROVIDER_SITE_OTHER): Payer: 59 | Admitting: Student

## 2021-04-23 ENCOUNTER — Encounter: Payer: Self-pay | Admitting: Student

## 2021-04-23 VITALS — BP 126/65 | HR 59 | Temp 98.2°F | Resp 32 | Ht 63.0 in | Wt 125.7 lb

## 2021-04-23 DIAGNOSIS — Z23 Encounter for immunization: Secondary | ICD-10-CM | POA: Diagnosis not present

## 2021-04-23 DIAGNOSIS — Z0000 Encounter for general adult medical examination without abnormal findings: Secondary | ICD-10-CM | POA: Diagnosis not present

## 2021-04-23 DIAGNOSIS — G44209 Tension-type headache, unspecified, not intractable: Secondary | ICD-10-CM | POA: Diagnosis not present

## 2021-04-23 MED ORDER — LIDOCAINE 4 % EX PTCH: 1.0000 | MEDICATED_PATCH | Freq: Every day | CUTANEOUS | 0 refills | Status: DC

## 2021-04-23 NOTE — Assessment & Plan Note (Signed)
Flu shot given

## 2021-04-23 NOTE — Patient Instructions (Signed)
Clayburn Pert, Ms.[name] por permitirnos brindarle atencin hoy. hoy discutimos  Dolor de cabeza Creo que usted est teniendo un dolor de cabeza por tensin. Esto puede deberse a msculos tensos en el cuello.Por favor, siga los ejercicios  Tambin puede tomar ibuprofeno 400 mg [place] 3 das. Por favor, no tome ms que esto. Tambin te he escrito para los parches de lidocana. Por favor coloque uno por da. tambin [employer] o bengay de [place].   He ordenado los siguientes laboratorios para usted:  Lab Orders  No laboratory test(s) ordered today     Pruebas ordenadas hoy:  Referencias ordenadas hoy:  Referral Orders  No referral(s) requested today     He ordenado el siguiente medicamento/cambiado los siguientes medicamentos:  Suspender los siguientes medicamentos: There are no discontinued medications.   Iniciar los siguientes medicamentos: Meds ordered this encounter  Medications   Lidocaine (HM LIDOCAINE PATCH) 4 % PTCH    Sig: Apply 1 patch topically daily.    Dispense:  10 patch    Refill:  0     Seguimiento Segn sea necesario   Recordar:  Por favor llame si necesita algo   Si tiene alguna pregunta o inquietud, llame a la clnica de medicina interna al [phone].     Sanjuana Letters, D.O. [place]

## 2021-04-23 NOTE — Assessment & Plan Note (Signed)
Assessment: Patient states she has had increased stress lately and has had a lot of tension in her shoulders and neck and has frontal headache that goes across her forehead.  When asked where it is located she moves her hand around the front of her head like a band.  When palpating her neck or bilateral shoulder she states that it is tender to palpate.  This is consistent with tension headache.  Gave patient stretches and will order lidocaine patch.  Also discussed taking ibuprofen 400 mg twice daily for 3 days.  Plan: -Stretches given, lidocaine patch ordered, 40 mg ibuprofen twice daily for 3 days advised
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
## 2021-04-27 ENCOUNTER — Telehealth (HOSPITAL_COMMUNITY): Payer: Self-pay | Admitting: *Deleted

## 2021-04-27 ENCOUNTER — Other Ambulatory Visit (HOSPITAL_COMMUNITY): Payer: 59

## 2021-04-27 ENCOUNTER — Encounter: Payer: Self-pay | Admitting: Cardiology

## 2021-04-27 NOTE — Progress Notes (Signed)
Internal Medicine Clinic Attending  Case discussed with Dr. Katsadouros  At the time of the visit.  We reviewed the resident's history and exam and pertinent patient test results.  I agree with the assessment, diagnosis, and plan of care documented in the resident's note.  

## 2021-04-27 NOTE — Telephone Encounter (Signed)
Reaching out to patient, through a Spanish interpreter Luanna Salk ID [phone]), to offer assistance regarding upcoming cardiac imaging study; pt verbalizes understanding of appt date/time, parking situation and where to check in, pre-test NPO status, and verified current allergies; name and call back number provided for further questions should they arise  Gordy Clement RN Navigator Cardiac Imaging [place] [facility] [phone] office [phone] cell

## 2021-04-27 NOTE — Progress Notes (Unsigned)
Patient ID: Karen [name], female   DOB: Sep 29, 1965, 55 y.o.   MRN: [id number]  Verified appointment "no show" status with Renetta at 4:14pm.

## 2021-04-28 ENCOUNTER — Other Ambulatory Visit: Payer: Self-pay

## 2021-04-28 ENCOUNTER — Ambulatory Visit (HOSPITAL_COMMUNITY)
Admission: RE | Admit: 2021-04-28 | Discharge: 2021-04-28 | Disposition: A | Discharge status: Home / Self Care | Payer: 59 | Source: Ambulatory Visit | Attending: Internal Medicine | Admitting: Internal Medicine

## 2021-04-28 ENCOUNTER — Encounter (HOSPITAL_COMMUNITY): Payer: Self-pay | Admitting: Internal Medicine

## 2021-04-28 DIAGNOSIS — R072 Precordial pain: Secondary | ICD-10-CM | POA: Insufficient documentation

## 2021-04-28 DIAGNOSIS — R0789 Other chest pain: Secondary | ICD-10-CM

## 2021-04-28 MED ORDER — NITROGLYCERIN 0.4 MG SL SUBL: 0.8000 mg | SUBLINGUAL_TABLET | Freq: Once | SUBLINGUAL | Status: AC

## 2021-04-28 MED ORDER — NITROGLYCERIN 0.4 MG SL SUBL
SUBLINGUAL_TABLET | SUBLINGUAL | Status: AC
  Administered 2021-04-28: 0.8 mg via SUBLINGUAL
  Filled 2021-04-28: qty 2

## 2021-04-28 MED ORDER — IOHEXOL 350 MG/ML SOLN
95.0000 mL | Freq: Once | INTRAVENOUS | Status: AC | PRN
  Administered 2021-04-28: 95 mL via INTRAVENOUS

## 2021-05-06 ENCOUNTER — Ambulatory Visit: Payer: 59 | Admitting: Behavioral Health

## 2021-05-19 ENCOUNTER — Other Ambulatory Visit: Payer: Self-pay | Admitting: Internal Medicine

## 2021-05-19 DIAGNOSIS — F419 Anxiety disorder, unspecified: Secondary | ICD-10-CM

## 2021-05-19 NOTE — Telephone Encounter (Signed)
Refill Request   clonazePAM (KLONOPIN) 0.5 MG tablet  [id number] [place] [place] [facility] [place]. (Ph: [phone])

## 2021-05-20 MED ORDER — CLONAZEPAM 0.5 MG PO TABS: 0.5000 mg | ORAL_TABLET | Freq: Every day | ORAL | 0 refills | Status: DC | PRN

## 2021-05-21 NOTE — Telephone Encounter (Signed)
[facility]

## 2021-05-29 ENCOUNTER — Other Ambulatory Visit (HOSPITAL_COMMUNITY): Payer: Self-pay | Admitting: Internal Medicine

## 2021-05-29 ENCOUNTER — Other Ambulatory Visit: Payer: Self-pay

## 2021-05-29 ENCOUNTER — Ambulatory Visit (HOSPITAL_COMMUNITY): Payer: 59 | Attending: Internal Medicine

## 2021-05-29 DIAGNOSIS — R079 Chest pain, unspecified: Secondary | ICD-10-CM

## 2021-05-29 LAB — ECHOCARDIOGRAM COMPLETE
Area-P 1/2: 3.08 cm2
S' Lateral: 2.5 cm

## 2021-06-16 ENCOUNTER — Other Ambulatory Visit: Payer: Self-pay | Admitting: Student

## 2021-06-16 DIAGNOSIS — F419 Anxiety disorder, unspecified: Secondary | ICD-10-CM

## 2021-06-23 ENCOUNTER — Ambulatory Visit: Payer: 59 | Admitting: Internal Medicine

## 2021-07-06 ENCOUNTER — Emergency Department (HOSPITAL_COMMUNITY): Payer: Self-pay

## 2021-07-06 ENCOUNTER — Emergency Department (HOSPITAL_COMMUNITY)
Admission: EM | Admit: 2021-07-06 | Discharge: 2021-07-06 | Disposition: A | Discharge status: Home / Self Care | Payer: Self-pay | Attending: Emergency Medicine | Admitting: Emergency Medicine

## 2021-07-06 DIAGNOSIS — U071 COVID-19: Secondary | ICD-10-CM | POA: Insufficient documentation

## 2021-07-06 DIAGNOSIS — R1011 Right upper quadrant pain: Secondary | ICD-10-CM | POA: Insufficient documentation

## 2021-07-06 DIAGNOSIS — R1031 Right lower quadrant pain: Secondary | ICD-10-CM | POA: Insufficient documentation

## 2021-07-06 LAB — URINALYSIS, ROUTINE W REFLEX MICROSCOPIC
Bilirubin Urine: NEGATIVE
Glucose, UA: NEGATIVE mg/dL
Hgb urine dipstick: NEGATIVE
Ketones, ur: NEGATIVE mg/dL
Leukocytes,Ua: NEGATIVE
Nitrite: NEGATIVE
Protein, ur: NEGATIVE mg/dL
Specific Gravity, Urine: 1.01 (ref 1.005–1.030)
pH: 6.5 (ref 5.0–8.0)

## 2021-07-06 LAB — COMPREHENSIVE METABOLIC PANEL
ALT: 17 U/L (ref 0–44)
AST: 22 U/L (ref 15–41)
Albumin: 4.4 g/dL (ref 3.5–5.0)
Alkaline Phosphatase: 61 U/L (ref 38–126)
Anion gap: 10 (ref 5–15)
BUN: 10 mg/dL (ref 6–20)
CO2: 24 mmol/L (ref 22–32)
Calcium: 9.7 mg/dL (ref 8.9–10.3)
Chloride: 104 mmol/L (ref 98–111)
Creatinine, Ser: 0.91 mg/dL (ref 0.44–1.00)
GFR, Estimated: >60 mL/min (ref >60)
Glucose, Bld: 104 mg/dL — ABNORMAL HIGH (ref 70–99)
Potassium: 4.2 mmol/L (ref 3.5–5.1)
Sodium: 138 mmol/L (ref 135–145)
Total Bilirubin: 0.6 mg/dL (ref 0.3–1.2)
Total Protein: 7.3 g/dL (ref 6.5–8.1)

## 2021-07-06 LAB — RESP PANEL BY RT-PCR (FLU A&B, COVID) ARPGX2
Influenza A by PCR: NEGATIVE
Influenza B by PCR: NEGATIVE
SARS Coronavirus 2 by RT PCR: POSITIVE — AB

## 2021-07-06 LAB — CBC
HCT: 39.7 % (ref 36.0–46.0)
Hemoglobin: 12.9 g/dL (ref 12.0–15.0)
MCH: 30.4 pg (ref 26.0–34.0)
MCHC: 32.5 g/dL (ref 30.0–36.0)
MCV: 93.4 fL (ref 80.0–100.0)
Platelets: 346 10*3/uL (ref 150–400)
RBC: 4.25 MIL/uL (ref 3.87–5.11)
RDW: 12.4 % (ref 11.5–15.5)
WBC: 6.7 10*3/uL (ref 4.0–10.5)
nRBC: 0 % (ref 0.0–0.2)

## 2021-07-06 LAB — I-STAT BETA HCG BLOOD, ED (MC, WL, AP ONLY): I-stat hCG, quantitative: 5.9 m[IU]/mL — ABNORMAL HIGH (ref <5)

## 2021-07-06 LAB — LIPASE, BLOOD: Lipase: 44 U/L (ref 11–51)

## 2021-07-06 MED ORDER — SODIUM CHLORIDE 0.9 % IV BOLUS
1000.0000 mL | Freq: Once | INTRAVENOUS | Status: AC
  Administered 2021-07-06: 1000 mL via INTRAVENOUS

## 2021-07-06 MED ORDER — FENTANYL CITRATE PF 50 MCG/ML IJ SOSY
50.0000 ug | PREFILLED_SYRINGE | Freq: Once | INTRAMUSCULAR | Status: AC
  Administered 2021-07-06: 50 ug via INTRAVENOUS
  Filled 2021-07-06: qty 1

## 2021-07-06 MED ORDER — IOHEXOL 300 MG/ML  SOLN
100.0000 mL | Freq: Once | INTRAMUSCULAR | Status: AC | PRN
  Administered 2021-07-06: 100 mL via INTRAVENOUS

## 2021-07-06 MED ORDER — ONDANSETRON HCL 4 MG PO TABS: 4.0000 mg | ORAL_TABLET | Freq: Three times a day (TID) | ORAL | 0 refills | Status: DC | PRN

## 2021-07-06 MED ORDER — ONDANSETRON HCL 4 MG/2ML IJ SOLN
4.0000 mg | Freq: Once | INTRAMUSCULAR | Status: AC
  Administered 2021-07-06: 4 mg via INTRAVENOUS
  Filled 2021-07-06: qty 2

## 2021-07-06 MED ORDER — MOLNUPIRAVIR EUA 200MG CAPSULE: 4.0000 | ORAL_CAPSULE | Freq: Two times a day (BID) | ORAL | 0 refills | Status: AC

## 2021-07-06 MED ORDER — ONDANSETRON 4 MG PO TBDP
4.0000 mg | ORAL_TABLET | Freq: Once | ORAL | Status: AC
  Administered 2021-07-06: 4 mg via ORAL
  Filled 2021-07-06: qty 1

## 2021-07-06 MED ORDER — ACETAMINOPHEN 325 MG PO TABS
650.0000 mg | ORAL_TABLET | Freq: Once | ORAL | Status: AC
Start: 2021-07-06 — End: 2021-07-06
  Administered 2021-07-06: 650 mg via ORAL
  Filled 2021-07-06: qty 2

## 2021-07-06 NOTE — ED Provider Notes (Signed)
Care assumed from Dr. Tamera Punt.  At time of transfer care, patient is awaiting for COVID test and CT of the abdomen pelvis to look for cause of her right-sided abdominal pain.  Anticipate disposition based on findings.  5:33 PM CT scan was reassuring with no evidence of acute surgical problem such as appendicitis, diverticulitis, cholecystitis, or obstruction however her COVID test was positive.  Suspect this is the cause of her symptoms.  Patient will be discharged home with work note, prescription for nausea medicine to maintain hydration, and she was asking for COVID medication.  We will give course of COVID medication and she will follow-up with PCP.  She understood return precautions and follow-up instructions and was discharged in good condition.  Clinical Impression: 1. COVID-19   2. RUQ abdominal pain     Disposition: Discharge  Condition: Good  I have discussed the results, Dx and Tx plan with the pt(& family if present). He/she/they expressed understanding and agree(s) with the plan. Discharge instructions discussed at great length. Strict return precautions discussed and pt &/or family have verbalized understanding of the instructions. No further questions at time of discharge.    Discharge Medication List as of 07/06/2021  5:38 PM     START taking these medications   Details  molnupiravir EUA (LAGEVRIO) 200 mg CAPS capsule Take 4 capsules (800 mg total) by mouth 2 (two) times daily for 5 days., Starting Mon 07/06/2021, Until Sat 07/11/2021, Print    ondansetron (ZOFRAN) 4 MG tablet Take 1 tablet (4 mg total) by mouth every 8 (eight) hours as needed for nausea or vomiting., Starting Mon 07/06/2021, Print        Follow Up: [place] [address] [place] [id number] [phone] Schedule an appointment as soon as possible for a visit    [place] [address] [place]       Amariyon Maynes, Gwenyth Allegra, MD 07/06/21 Valerie Roys

## 2021-07-06 NOTE — Discharge Instructions (Addendum)
Your work-up today confirmed COVID-19 as likely cause of your symptoms.  Your CT imaging was reassuring.  Please follow-up with your primary doctor and please rest and stay hydrated.  Please use the nausea medicine as well.  Please consider using the COVID medication to help prevent worsening infection and please rest.  If any symptoms change or worsen, please return to the nearest emergency department.  Please follow-up with your primary doctor.

## 2021-07-06 NOTE — ED Provider Triage Note (Signed)
Emergency Medicine Provider Triage Evaluation Note  Karen [name] , a 56 y.o. female  was evaluated in triage.  Pt complains of abdominal pain.  She has a history of slow bowel transit.  Last night she began having severe pain in her epigastrium radiating to the right side and her right shoulder blade region.  She had multiple episodes of vomiting.  She rates her pain still as fairly severe.  She has a history of abdominal hysterectomy no other previous surgeries to her abdomen..  Review of Systems  Positive: Right upper quadrant abdominal pain Negative: Fever  Physical Exam  BP 108/60 (BP Location: Right Arm)    Pulse 62    Temp 98.3 F (36.8 C) (Oral)    Resp 20    LMP 02/08/2006    SpO2 99%  Gen:   Awake, no distress   Resp:  Normal effort  MSK:   Moves extremities without difficulty  Other:  Positive Murphy sign right upper quadrant  Medical Decision Making  Medically screening exam initiated at 10:52 AM.  Appropriate orders placed.  Landi Biscardi was informed that the remainder of the evaluation will be completed by another provider, this initial triage assessment does not replace that evaluation, and the importance of remaining in the ED until their evaluation is complete.  Labs and imaging ordered   Margarita Mail, PA-C 07/06/21 1053

## 2021-07-06 NOTE — ED Provider Notes (Signed)
[facility] EMERGENCY DEPARTMENT Provider Note   CSN: [id number] Arrival date & time: 07/06/21  5427     History  Chief Complaint  Patient presents with   Abdominal Pain    Karen [name] is a 56 y.o. female.  She isPatient is a 56 year old female who presents with abdominal pain.  This been going on intermittently for a week but is more persistent for the last couple of days.  She has had some associated nausea but no vomiting.  She has had some diarrhea.  No urinary symptoms.  No vaginal bleeding or discharge.  She is status post hysterectomy but no other abdominal surgeries.  No known fevers.  She says she has had a bifrontal type headache as well as some rhinorrhea but no coughing.      Home Medications Prior to Admission medications   Medication Sig Start Date End Date Taking? Authorizing Provider  clonazePAM (KLONOPIN) 0.5 MG tablet TAKE 1 TABLET (0.5 MG TOTAL) BY MOUTH DAILY AS NEEDED FOR UP TO 30 DOSES FOR ANXIETY. 06/17/21   Sid Falcon, MD  Lidocaine (HM LIDOCAINE PATCH) 4 % PTCH Apply 1 patch topically daily. 04/23/21   Katsadouros, Vasilios, MD  sertraline (ZOLOFT) 50 MG tablet Take 50 mg by mouth daily. 07/06/17   [provider]      Allergies    Hydromorphone hcl and Oxycodone-acetaminophen    Review of Systems   Review of Systems  Constitutional:  Positive for fatigue. Negative for chills, diaphoresis and fever.  HENT:  Positive for rhinorrhea. Negative for congestion and sneezing.   Eyes: Negative.   Respiratory:  Negative for cough, chest tightness and shortness of breath.   Cardiovascular:  Negative for chest pain and leg swelling.  Gastrointestinal:  Positive for abdominal pain, diarrhea and nausea. Negative for blood in stool and vomiting.  Genitourinary:  Negative for difficulty urinating, flank pain, frequency and hematuria.  Musculoskeletal:  Negative for arthralgias and back pain.  Skin:  Negative for rash.   Neurological:  Positive for headaches. Negative for dizziness, speech difficulty, weakness and numbness.   Physical Exam Updated Vital Signs BP (!) 119/50 (BP Location: Right Arm)    Pulse (!) 56    Temp 97.7 F (36.5 C) (Oral)    Resp 18    LMP 02/08/2006    SpO2 97%  Physical Exam Constitutional:      Appearance: She is well-developed.  HENT:     Head: Normocephalic and atraumatic.  Eyes:     Pupils: Pupils are equal, round, and reactive to light.  Cardiovascular:     Rate and Rhythm: Normal rate and regular rhythm.     Heart sounds: Normal heart sounds.  Pulmonary:     Effort: Pulmonary effort is normal. No respiratory distress.     Breath sounds: Normal breath sounds. No wheezing or rales.  Chest:     Chest wall: No tenderness.  Abdominal:     General: Bowel sounds are normal.     Palpations: Abdomen is soft.     Tenderness: There is abdominal tenderness in the right upper quadrant, right lower quadrant and suprapubic area. There is no guarding or rebound.  Musculoskeletal:        General: Normal range of motion.     Cervical back: Normal range of motion and neck supple.  Lymphadenopathy:     Cervical: No cervical adenopathy.  Skin:    General: Skin is warm and dry.     Findings:  No rash.  Neurological:     Mental Status: She is alert and oriented to person, place, and time.    ED Results / Procedures / Treatments   Labs (all labs ordered are listed, but only abnormal results are displayed) Labs Reviewed  COMPREHENSIVE METABOLIC PANEL - Abnormal; Notable for the following components:      Result Value   Glucose, Bld 104 (*)    All other components within normal limits  I-STAT BETA HCG BLOOD, ED ([facility], [facility], [facility] ONLY) - Abnormal; Notable for the following components:   I-stat hCG, quantitative 5.9 (*)    All other components within normal limits  RESP PANEL BY RT-PCR (FLU A&B, COVID) ARPGX2  LIPASE, BLOOD  CBC  URINALYSIS, ROUTINE W REFLEX MICROSCOPIC     EKG None  Radiology US ABDOMEN LIMITED RUQ (LIVER/GB)  Result Date: 07/06/2021 CLINICAL DATA:  Right upper quadrant pain for 1 week. EXAM: ULTRASOUND ABDOMEN LIMITED RIGHT UPPER QUADRANT COMPARISON:  11/27/2008. FINDINGS: Gallbladder: Contracted. No gallstones, wall thickening or sonographic Murphy sign. Common bile duct: Diameter: 4 mm, within normal limits. Liver: No focal lesion identified. Within normal limits in parenchymal echogenicity. Portal vein is patent on color Doppler imaging with normal direction of blood flow towards the liver. Other: None. IMPRESSION: No acute findings. Electronically Signed   By: Lorin Picket M.D.   On: 07/06/2021 11:39    Procedures Procedures    Medications Ordered in ED Medications  ondansetron (ZOFRAN-ODT) disintegrating tablet 4 mg (4 mg Oral Given 07/06/21 1058)  acetaminophen (TYLENOL) tablet 650 mg (650 mg Oral Given 07/06/21 1057)  fentaNYL (SUBLIMAZE) injection 50 mcg (50 mcg Intravenous Given 07/06/21 1428)  sodium chloride 0.9 % bolus 1,000 mL (1,000 mLs Intravenous New Bag/Given 07/06/21 1427)  ondansetron (ZOFRAN) injection 4 mg (4 mg Intravenous Given 07/06/21 1428)  iohexol (OMNIPAQUE) 300 MG/ML solution 100 mL (100 mLs Intravenous Contrast Given 07/06/21 1533)    ED Course/ Medical Decision Making/ A&P                           Medical Decision Making  Patient is a 56 year old female who presents with right-sided abdominal pain.  She had some right upper and lower abdominal pain.  She had an ultrasound ordered from triage and was showed no gallstones or suggestions of cholecystitis.  Her labs are reviewed by me and are nonconcerning.  Her i-STAT urine pregnancy is slightly positive but she is status post hysterectomy.  Suspect this is a false positive.  She had a CT scan of her abdomen pelvis which is currently pending.  Care turned over to Dr. Sherry Ruffing pending this evaluation.  Final Clinical Impression(s) / ED Diagnoses Final  diagnoses:  RUQ abdominal pain    Rx / DC Orders ED Discharge Orders     None         Malvin Johns, MD 07/06/21 1540

## 2021-07-06 NOTE — ED Notes (Signed)
Pt verbalized understanding of d/c instructions, meds and followup care. Denies questions. VSS, no distress noted. Steady gait to exit with all belongings.  ?

## 2021-07-06 NOTE — ED Triage Notes (Signed)
Patient here with complaint of abdominal pain, nausea, and diarrhea that started approximately one week ago. Patient alert, oriented, ambulatory, and in no apparent distress  this time.

## 2021-07-20 ENCOUNTER — Other Ambulatory Visit: Payer: Self-pay | Admitting: Internal Medicine

## 2021-07-20 DIAGNOSIS — F419 Anxiety disorder, unspecified: Secondary | ICD-10-CM

## 2021-08-12 ENCOUNTER — Other Ambulatory Visit: Payer: Self-pay

## 2021-08-12 DIAGNOSIS — F419 Anxiety disorder, unspecified: Secondary | ICD-10-CM

## 2021-08-12 MED ORDER — CLONAZEPAM 0.5 MG PO TABS: ORAL_TABLET | ORAL | 0 refills | Status: DC

## 2021-08-12 NOTE — Telephone Encounter (Signed)
clonazePAM (KLONOPIN) 0.5 MG tablet, refill request @ [id number] - [place].

## 2021-08-12 NOTE — Telephone Encounter (Signed)
Next appt scheduled 08/25/21 with Dr Collene Gobble.

## 2021-08-25 ENCOUNTER — Other Ambulatory Visit: Payer: Self-pay | Admitting: Student

## 2021-08-25 ENCOUNTER — Encounter: Payer: Self-pay | Admitting: Student

## 2021-08-25 ENCOUNTER — Ambulatory Visit (INDEPENDENT_AMBULATORY_CARE_PROVIDER_SITE_OTHER): Payer: PRIVATE HEALTH INSURANCE | Admitting: Student

## 2021-08-25 VITALS — BP 118/49 | HR 69 | Wt 123.5 lb

## 2021-08-25 DIAGNOSIS — M5416 Radiculopathy, lumbar region: Secondary | ICD-10-CM | POA: Diagnosis not present

## 2021-08-25 DIAGNOSIS — B351 Tinea unguium: Secondary | ICD-10-CM

## 2021-08-25 DIAGNOSIS — R0609 Other forms of dyspnea: Secondary | ICD-10-CM

## 2021-08-25 DIAGNOSIS — Z1211 Encounter for screening for malignant neoplasm of colon: Secondary | ICD-10-CM | POA: Diagnosis not present

## 2021-08-25 DIAGNOSIS — Z13220 Encounter for screening for lipoid disorders: Secondary | ICD-10-CM

## 2021-08-25 DIAGNOSIS — Z0000 Encounter for general adult medical examination without abnormal findings: Secondary | ICD-10-CM

## 2021-08-25 MED ORDER — CICLOPIROX 8 % EX KIT: 1.0000 | PACK | Freq: Every day | CUTANEOUS | 0 refills | Status: DC

## 2021-08-25 MED ORDER — EFINACONAZOLE 10 % EX SOLN: 1.0000 "application " | Freq: Every day | CUTANEOUS | 0 refills | Status: DC

## 2021-08-25 NOTE — Patient Instructions (Addendum)
Dell, me alegre de verle hoy! ? ?He ordenado los siguientes laboratorios para usted: ? ? ?Lab Orders    ?     Lipid Profile     ? ?Referencias ordenadas hoy: ? ? ?Referral Orders    ?     Ambulatory referral to Gastroenterology    ?     Ambulatory referral to Interventional Radiology     ? ?He ordenado el siguiente medicamento/cambiado los siguientes medicamentos: ? ?Iniciar los siguientes medicamentos: ?Meds ordered this encounter  ?Medications  ? Ciclopirox 8 % KIT  ?  Sig: Apply 1 Dose topically daily.  ?  Dispense:  34.6 mL  ?  Refill:  0  ?  ? ?Seguimiento 6 months  ? ? ?Recordar:  ? ?- Infecci?n de las u?as de los pies: Por favor, use el medicamento una vez al d?a. Si necesita m?s medicamentos, h?ganoslo saber. ? ?- He enviado referencias para probar sus pulmones y para su inyecci?n de espalda. ? ?- Te llamar? con los resultados de la prueba de laboratorio. ? ?- Toenail infection: Please use the medication once daily. If you need more medication, please let us know. ? ?- I have sent in referrals to test your lungs and for your back injection. ? ?- I will call you with the results of the lab test. ? ?Aseg?rese de llegar 15 minutos antes de su pr?xima cita. Si llega tarde, es posible que se IT trainer. ? ?Estamos deseando verle la proxima vez. [place] a [phone] si tiene preguntas o preocupaciones. El mejor tiempo para llamar es lunes a viernes, 9:00am - 4:00pm, pero hay alguien esta a su disposcion para lo que halga falta a cualquier hora. Si es fuera del horario de atenci?n o durante el fin de [place], llame al n?mero principal del hospital y pregunte por el residente de guardia de medicina interna. SI necesita recambio de los [employer] , llama su farmacia una semana antes de fecha de finalizacion de la receta. La farmacia nos llamara para la solicitud. ? ?Gracias que nos permite tomar parte en su asistencia medica. Deseamos lo mejor! ? ?Gracias, ?Sanjuan Dame, MD ? ?

## 2021-08-26 ENCOUNTER — Other Ambulatory Visit: Payer: Self-pay | Admitting: Student

## 2021-08-26 DIAGNOSIS — B351 Tinea unguium: Secondary | ICD-10-CM

## 2021-08-26 LAB — LIPID PANEL
Chol/HDL Ratio: 5.3 ratio — ABNORMAL HIGH (ref 0.0–4.4)
Cholesterol, Total: 260 mg/dL — ABNORMAL HIGH (ref 100–199)
HDL: 49 mg/dL (ref >39)
LDL Chol Calc (NIH): 160 mg/dL — ABNORMAL HIGH (ref 0–99)
Triglycerides: 274 mg/dL — ABNORMAL HIGH (ref 0–149)
VLDL Cholesterol Cal: 51 mg/dL — ABNORMAL HIGH (ref 5–40)

## 2021-08-26 MED ORDER — TAVABOROLE 5 % EX SOLN: 1.0000 "application " | Freq: Every day | CUTANEOUS | 2 refills | Status: DC

## 2021-08-27 DIAGNOSIS — B351 Tinea unguium: Secondary | ICD-10-CM | POA: Insufficient documentation

## 2021-08-27 NOTE — Assessment & Plan Note (Signed)
-   Colonoscopy ordered today ?- Lipid panel today ?

## 2021-08-27 NOTE — Assessment & Plan Note (Signed)
This is a long-standing issue for [name]. She reports she has been receiving steroid injections with IR for this. She denies any new symptoms or new injuries. No red flag symptoms, including fevers, history of cancers, bowel/urinary incontinence. Also denies any weakness, numbness, or paresthesias in lower extremities. Last injection [name] received was roughly one year ago. ? ?On examination, lumbar paraspinal tenderness appreciated without any neurological deficits.  Patient did attempt to call IR to set up an appointment and was told she would need a referral. We will place this today.  ? ?- Referral IR for steroid injections ?- Topical agents, including voltaren gel and lidocaine patch for further relief. ?

## 2021-08-27 NOTE — Assessment & Plan Note (Signed)
Patient reports she has had chronic dyspnea on exertion that has continued over the last few years. She denies any associated chest pain, lightheadedness, dizziness, syncopal episodes. She also denies weight changes, paroxysmal nocturnal dyspnea, or orthopnea. Patient was previously prescribed albuterol for this, but she no longer uses this. She denies any systemic symptoms, including fever, chills, body aches, night sweats.  No smoking history and no known environmental factors as well. ? ?On examination, lungs clear to ausculation and patient sating well on room air. Unclear etiology for patient's DOE. Patient did have recent cardiac work-up with Echo and CTA coronaries, both of which were normal. We will obtain PFT's to further evaluate. ? ?- Follow-up PFT's ?

## 2021-08-27 NOTE — Assessment & Plan Note (Signed)
Patient reports she has noticed her toenails have become thickened and yellow recently. Mentions she tries to keep her feet groomed as best as she can, but has not been able to get rid of these symptoms. ? ?Bilateral first toes notable for likely onychomycosis. Discussed topical agents, including Ciclopirox for this. Attempted to prescribe multiple topical agents, however insurance has not approved. Will discuss with patient over the phone regarding over the counter topical medications. ? ?- OTC topical antifungal medication ?

## 2021-08-27 NOTE — Assessment & Plan Note (Signed)
>>  ASSESSMENT AND PLAN FOR DYSPNEA ON EXERTION WRITTEN ON 08/27/2021  2:00 PM BY BRASWELL, PHILLIP, MD  Patient reports she has had chronic dyspnea on exertion that has continued over the last few years. She denies any associated chest pain, lightheadedness, dizziness, syncopal episodes. She also denies weight changes, paroxysmal nocturnal dyspnea, or orthopnea. Patient was previously prescribed albuterol for this, but she no longer uses this. She denies any systemic symptoms, including fever, chills, body aches, night sweats.  No smoking history and no known environmental factors as well.  On examination, lungs clear to ausculation and patient sating well on room air. Unclear etiology for patient's DOE. Patient did have recent cardiac work-up with Echo and CTA coronaries, both of which were normal. We will obtain PFT's to further evaluate.  - Follow-up PFT's
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
## 2021-08-28 NOTE — Progress Notes (Signed)
Internal Medicine Clinic Attending ? ?Case discussed with Dr. Braswell  At the time of the visit.  We reviewed the resident?s history and exam and pertinent patient test results.  I agree with the assessment, diagnosis, and plan of care documented in the resident?s note.  ?

## 2021-08-28 NOTE — Telephone Encounter (Signed)
I have discussed with patient that she should get over the counter topical antifungals given her insurance will not cover these. Therefore, will no longer need prescription. ?

## 2021-09-22 ENCOUNTER — Other Ambulatory Visit: Payer: Self-pay | Admitting: Internal Medicine

## 2021-09-22 DIAGNOSIS — F419 Anxiety disorder, unspecified: Secondary | ICD-10-CM

## 2021-10-19 ENCOUNTER — Other Ambulatory Visit: Payer: Self-pay | Admitting: Internal Medicine

## 2021-10-19 DIAGNOSIS — F419 Anxiety disorder, unspecified: Secondary | ICD-10-CM

## 2021-10-19 NOTE — Telephone Encounter (Signed)
Refill Request ? ? ?clonazePAM (KLONOPIN) 0.5 MG tablet ? ?[id number] [place] (Ph: [phone]) ?

## 2021-10-20 MED ORDER — CLONAZEPAM 0.5 MG PO TABS: ORAL_TABLET | ORAL | 0 refills | Status: DC

## 2021-11-19 ENCOUNTER — Other Ambulatory Visit: Payer: Self-pay | Admitting: *Deleted

## 2021-11-19 DIAGNOSIS — F419 Anxiety disorder, unspecified: Secondary | ICD-10-CM

## 2021-11-19 MED ORDER — CLONAZEPAM 0.5 MG PO TABS: ORAL_TABLET | ORAL | 0 refills | Status: DC

## 2021-11-19 NOTE — Telephone Encounter (Signed)
Call from pt  Requests refill on clonazepam Last vist 09/14/21 with instructions to return in 52mths Will send refill request to MD for review

## 2021-12-01 ENCOUNTER — Encounter: Payer: Self-pay | Admitting: Student

## 2021-12-01 ENCOUNTER — Ambulatory Visit (INDEPENDENT_AMBULATORY_CARE_PROVIDER_SITE_OTHER): Payer: 59 | Admitting: Student

## 2021-12-01 ENCOUNTER — Other Ambulatory Visit (HOSPITAL_COMMUNITY)
Admission: RE | Admit: 2021-12-01 | Discharge: 2021-12-01 | Disposition: A | Discharge status: Home / Self Care | Payer: 59 | Source: Ambulatory Visit | Attending: Internal Medicine | Admitting: Internal Medicine

## 2021-12-01 ENCOUNTER — Other Ambulatory Visit: Payer: Self-pay

## 2021-12-01 VITALS — BP 120/65 | HR 62 | Temp 97.5°F | Ht 63.0 in | Wt 125.4 lb

## 2021-12-01 DIAGNOSIS — A749 Chlamydial infection, unspecified: Secondary | ICD-10-CM

## 2021-12-01 DIAGNOSIS — Z599 Problem related to housing and economic circumstances, unspecified: Secondary | ICD-10-CM

## 2021-12-01 DIAGNOSIS — G43909 Migraine, unspecified, not intractable, without status migrainosus: Secondary | ICD-10-CM | POA: Diagnosis not present

## 2021-12-01 DIAGNOSIS — M5416 Radiculopathy, lumbar region: Secondary | ICD-10-CM | POA: Diagnosis not present

## 2021-12-01 DIAGNOSIS — Z0000 Encounter for general adult medical examination without abnormal findings: Secondary | ICD-10-CM

## 2021-12-01 DIAGNOSIS — Z113 Encounter for screening for infections with a predominantly sexual mode of transmission: Secondary | ICD-10-CM

## 2021-12-01 DIAGNOSIS — R102 Pelvic and perineal pain: Secondary | ICD-10-CM | POA: Insufficient documentation

## 2021-12-01 DIAGNOSIS — Z1231 Encounter for screening mammogram for malignant neoplasm of breast: Secondary | ICD-10-CM

## 2021-12-01 DIAGNOSIS — Z1159 Encounter for screening for other viral diseases: Secondary | ICD-10-CM

## 2021-12-01 DIAGNOSIS — R5383 Other fatigue: Secondary | ICD-10-CM

## 2021-12-01 DIAGNOSIS — G43009 Migraine without aura, not intractable, without status migrainosus: Secondary | ICD-10-CM

## 2021-12-01 MED ORDER — IBUPROFEN 600 MG PO TABS: 600.0000 mg | ORAL_TABLET | Freq: Four times a day (QID) | ORAL | 1 refills | Status: DC | PRN

## 2021-12-01 MED ORDER — LIDOCAINE 4 % EX PTCH: 1.0000 | MEDICATED_PATCH | Freq: Every day | CUTANEOUS | 1 refills | Status: DC

## 2021-12-01 NOTE — Patient Instructions (Addendum)
Thank you, Ms.[name] for allowing [place] to provide your care today. Today we discussed migraine, stomach symptoms, vaginal symptoms and sciatic pain.    For your sciatic pain, I recommend going back to orthopedic doctors to get your injection.  I have ordered the following labs for you:  Lab Orders         Hepatitis C Ab reflex to Quant PCR         TSH         Urinalysis, Reflex Microscopic      I will call if any are abnormal. All of your labs can be accessed through "My Chart".   I have ordered the following tests: Screening mammogram  I have ordered the following medication/changed the following medications:  Start ibuprofen 600 mg every 6 hours as needed for headache or pain Refilled your lidocaine patch.  My Chart Access: [email or web address]  Please follow-up in 2 weeks for pelvic exam if symptoms do not resolve  Please make sure to arrive 15 minutes prior to your next appointment. If you arrive late, you may be asked to reschedule.    We look forward to seeing you next time. Please call our clinic at [phone] if you have any questions or concerns. The best time to call is Monday-Friday from 9am-4pm, but there is someone available 24/7. If after hours or the weekend, call the main hospital number and ask for the Internal Medicine Resident On-Call. If you need medication refills, please notify your pharmacy one week in advance and they will send [place] a request.   Thank you for letting us take part in your care. Wishing you the best!  Lacinda Axon, MD 12/01/2021, 11:43 AM IM Resident, PGY-2 [place] 41:10  [place]. Werner Lean por permitirnos brindarle su atencin hoy. Hoy discutimos la migraa, los sntomas estomacales, los sntomas vaginales y Conservation officer, historic buildings citico.  Para su dolor citico, le recomiendo volver a los mdicos ortopdicos para que le pongan la inyeccin.  He ordenado los siguientes  laboratorios para usted:  rdenes de laboratorio      Hepatitis C Ab reflejo a Quant PCR      TSH      Anlisis de orina, reflejo microscpico  Llamar si alguno es anormal. Se puede acceder a todos sus laboratorios a travs de "My Chart".   He ordenado las siguientes pruebas: Mamografa de deteccin  He ordenado el siguiente medicamento/cambiado los siguientes medicamentos: 1. Comience con ibuprofeno 600 mg cada 6 horas segn sea necesario para el dolor de cabeza o el dolor 2. Vuelva a llenar su parche de lidocana.  Acceso a mi grfico: [email or web address]  Haga un seguimiento en 2 semanas para un examen plvico si los sntomas no se resuelven  Asegrese de llegar 15 minutos antes de su prxima cita. Si llega tarde, es posible que se IT trainer.  Esperamos verte la prxima vez. Llame a nuestra clnica al [phone] si tiene alguna pregunta o inquietud. El mejor horario para llamar es de lunes a viernes de 9 a. m. a 4 p. m., pero hay alguien disponible las 24 horas del da, los 7 das de la [place]. Si es fuera del horario de atencin o durante el fin de [place], llame al nmero principal del hospital y pregunte por el residente de guardia de medicina interna. Si necesita reposicin de medicamentos, por favor notifique a su farmacia con una semana de anticipacin y ellos nos enviarn una solicitud.  Gracias por dejarnos participar en  su cuidado. Deseandote lo mejor!  Lacinda Axon, MD 13/11/2021, 11:43 Residente MI, PGY-2 [place] 41:10
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
## 2021-12-02 LAB — URINALYSIS, ROUTINE W REFLEX MICROSCOPIC
Bilirubin, UA: NEGATIVE
Glucose, UA: NEGATIVE
Nitrite, UA: NEGATIVE
Protein,UA: NEGATIVE
RBC, UA: NEGATIVE
Specific Gravity, UA: 1.023 (ref 1.005–1.030)
Urobilinogen, Ur: 1 mg/dL (ref 0.2–1.0)
pH, UA: 6 (ref 5.0–7.5)

## 2021-12-02 LAB — MICROSCOPIC EXAMINATION
Bacteria, UA: NONE SEEN
Casts: NONE SEEN /lpf
Epithelial Cells (non renal): NONE SEEN /hpf (ref 0–10)
WBC, UA: NONE SEEN /hpf (ref 0–5)

## 2021-12-02 LAB — HCV INTERPRETATION

## 2021-12-02 LAB — HCV AB W REFLEX TO QUANT PCR: HCV Ab: NONREACTIVE

## 2021-12-02 LAB — TSH: TSH: 1.77 u[IU]/mL (ref 0.450–4.500)

## 2021-12-03 LAB — URINE CYTOLOGY ANCILLARY ONLY
Candida Urine: NEGATIVE
Chlamydia: POSITIVE — AB
Comment: NEGATIVE
Comment: NEGATIVE
Comment: NORMAL
Neisseria Gonorrhea: NEGATIVE
Trichomonas: NEGATIVE

## 2021-12-03 MED ORDER — DOXYCYCLINE HYCLATE 100 MG PO CAPS: 100.0000 mg | ORAL_CAPSULE | Freq: Two times a day (BID) | ORAL | 0 refills | Status: AC

## 2021-12-04 ENCOUNTER — Encounter: Payer: Self-pay | Admitting: Student

## 2021-12-04 ENCOUNTER — Encounter: Payer: Self-pay | Admitting: *Deleted

## 2021-12-04 ENCOUNTER — Telehealth: Payer: Self-pay

## 2021-12-04 DIAGNOSIS — A749 Chlamydial infection, unspecified: Secondary | ICD-10-CM | POA: Insufficient documentation

## 2021-12-04 NOTE — Assessment & Plan Note (Signed)
Patient reports that she might not be able to pick up the prescription for the doxycycline if it is too expensive for her.  She has a history of financial difficulties and food insecurity. -Referral to community care coordination

## 2021-12-04 NOTE — Assessment & Plan Note (Signed)
Patient continues to have chronic low back pain without red flag symptoms. Patient has received steroid injections in the past with improvement in her symptoms. -Referral to IR pending for epidural steroid injections -Refilled lidocaine patch

## 2021-12-04 NOTE — Assessment & Plan Note (Signed)
Spanish-speaking patient with a history of STIs presents to clinic today to follow-up multiple abdominal and GU complaints. Patient states over the past 2 weeks she has had abdominal pain, nausea, vaginal discharge and and vaginal pain during sexual intercourse.  She also reports feeling fatigued with low energy but denies any dysuria, constipation, chest pain, hematuria or dizziness. She continues to have chronic migraine and low back pain. She has a history of IBS and reports having occasional diarrhea. She is currently sexually active without protection with only her spouse. Laboratory studies showed normal TSH, hepatitis C screening and urinalysis. STI testing with ancillary urine shows that patient is positive for Chlamydia trachomatis. On chart review, patient has a prior history of chlamydia and gonorrhea infection. Patient called via the Spanish interpreter line to discuss her positive STI testing. Patient was informed to tell her spouse about her positive chlamydia test and for the spouse to get tested right away.  She was also informed about plan to send her antibiotics treatment.  Patient states she is having some financial difficulties but will try to pick up the prescription. She will let us know if she is unable to afford the medicine.  Spanish telephone interpretation via Jenny Reichmann, [place]: -Start doxycycline 100 mg every 12 hours for 7 days -Completed document with assistance of Olivia Mackie to report to health department -Follow-up STI testing in 3 months to ensure treatment success

## 2021-12-04 NOTE — Telephone Encounter (Signed)
   Telephone encounter was:  Unsuccessful.  12/04/2021 Name: Karen [name] MRN: [id number] DOB: [birth year]  Unsuccessful outbound call made today to assist with:  Financial Difficulties related to medication.  Outreach Attempt:  1st Attempt  A HIPAA compliant voice message was left requesting a return call.  Instructed patient to call back at [phone]. Called patient with assistance from [employer] [place]. Left message on voicemail to return my call.  Mrk Buzby, AAS Paralegal, [facility] [employer]  [address], [place] [address] ??[email or web address]  ?? [phone]   [email or web address]

## 2021-12-04 NOTE — Progress Notes (Signed)
12-04-2021 at [place]   I faxed a Confidential Communicable Disease Report to the [facility], The Department, [phone], attn:  Bethena Midget BSN, RN.  Form completed by Dr Katherine Roan, [facility] 12-04-2021 [id number]

## 2021-12-04 NOTE — Assessment & Plan Note (Signed)
Patient reports history of migraine. She continues to have migraine headaches. She has tried over-the-counter ibuprofen 200 mg with minimal relief. She reports some recent nausea but denies any visual deficits.  Plan: -Start ibuprofen 600 mg every 6 hours as needed for headaches -Avoidance of triggers and stress reducing activity -Consider Imitrex if migraine not responsive to current therapy.

## 2021-12-04 NOTE — Progress Notes (Signed)
Internal Medicine Clinic Attending  Case discussed with the resident at the time of the visit.  We reviewed the resident's history and exam and pertinent patient test results.  I agree with the assessment, diagnosis, and plan of care documented in the resident's note.  

## 2021-12-04 NOTE — Assessment & Plan Note (Signed)
Referral for screening mammogram

## 2021-12-08 ENCOUNTER — Telehealth: Payer: Self-pay

## 2021-12-08 NOTE — Telephone Encounter (Signed)
   Telephone encounter was:  Successful.  12/08/2021 Name: Karen [name] MRN: [id number] DOB: 11/05/1965  Karen [name] is a 56 y.o. year old female who is a primary care patient of Maudie Mercury, MD . The community resource team was consulted for assistance with  medication.  Care guide performed the following interventions: Per Epic message from Dr. Coy Saunas patient was able to pick up her medication from the pharmacy.   Follow Up Plan:  No further follow up planned at this time. The patient has been provided with needed resources.  Adeyemi Hamad, AAS Paralegal, [place] [employer]  [address], [place] [address] ??[email or web address]  ?? [phone]   [email or web address]

## 2021-12-17 ENCOUNTER — Other Ambulatory Visit: Payer: Self-pay | Admitting: Internal Medicine

## 2021-12-17 DIAGNOSIS — F419 Anxiety disorder, unspecified: Secondary | ICD-10-CM

## 2022-01-21 ENCOUNTER — Other Ambulatory Visit: Payer: Self-pay | Admitting: Student in an Organized Health Care Education/Training Program

## 2022-01-21 DIAGNOSIS — F419 Anxiety disorder, unspecified: Secondary | ICD-10-CM

## 2022-02-01 DIAGNOSIS — F331 Major depressive disorder, recurrent, moderate: Secondary | ICD-10-CM | POA: Diagnosis not present

## 2022-02-01 DIAGNOSIS — F4310 Post-traumatic stress disorder, unspecified: Secondary | ICD-10-CM | POA: Diagnosis not present

## 2022-02-01 DIAGNOSIS — F410 Panic disorder [episodic paroxysmal anxiety] without agoraphobia: Secondary | ICD-10-CM | POA: Diagnosis not present

## 2022-02-18 ENCOUNTER — Other Ambulatory Visit: Payer: Self-pay | Admitting: Internal Medicine

## 2022-02-18 DIAGNOSIS — F419 Anxiety disorder, unspecified: Secondary | ICD-10-CM

## 2022-03-05 ENCOUNTER — Ambulatory Visit: Payer: BC Managed Care – PPO | Admitting: Internal Medicine

## 2022-03-05 VITALS — BP 117/62 | HR 63 | Temp 98.1°F | Wt 120.8 lb

## 2022-03-05 DIAGNOSIS — U071 COVID-19: Secondary | ICD-10-CM | POA: Insufficient documentation

## 2022-03-05 DIAGNOSIS — M5416 Radiculopathy, lumbar region: Secondary | ICD-10-CM

## 2022-03-05 DIAGNOSIS — J069 Acute upper respiratory infection, unspecified: Secondary | ICD-10-CM

## 2022-03-05 MED ORDER — METHOCARBAMOL 750 MG PO TABS: 750.0000 mg | ORAL_TABLET | Freq: Four times a day (QID) | ORAL | 0 refills | Status: DC
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
## 2022-03-05 NOTE — Patient Instructions (Addendum)
[name], it was a pleasure seeing you today! You endorsed feeling well today. Below are some of the things we talked about this visit. We look forward to seeing you in the follow up appointment!  Today we discussed: You have a upper respiratory tract infection that is resolving. Continue taking Dayquil, ibuprofen, and tylenol for your symptoms. Limit the ibuprofen to 1200 mg per day and tylenol to 3000 mg per day.  We can make some medication changes for your pain but would like to see you when you don't have an infection.  We will give you robaxin for muscles aches that you are endorsing as it is a chronic problem for you.   I have ordered the following labs today:  Lab Orders  No laboratory test(s) ordered today      Referrals ordered today:   Referral Orders  No referral(s) requested today     I have ordered the following medication/changed the following medications:   Stop the following medications: There are no discontinued medications.   Start the following medications: Meds ordered this encounter  Medications   methocarbamol (ROBAXIN-750) 750 MG tablet    Sig: Take 1 tablet (750 mg total) by mouth 4 (four) times daily for 15 days.    Dispense:  60 tablet    Refill:  0     Follow-up: 3-4 week follow up  Please make sure to arrive 15 minutes prior to your next appointment. If you arrive late, you may be asked to reschedule.   We look forward to seeing you next time. Please call our clinic at [phone] if you have any questions or concerns. The best time to call is Monday-Friday from 9am-4pm, but there is someone available 24/7. If after hours or the weekend, call the main hospital number and ask for the Internal Medicine Resident On-Call. If you need medication refills, please notify your pharmacy one week in advance and they will send [place] a request.  Thank you for letting us take part in your care. Wishing you the best!  Thank you, Karen Schuller, MD    Karen [name], fue un placer verla hoy! Respaldaste sentirte bien hoy. A continuacin se presentan algunas de las cosas que hablamos sobre esta visita. Esperamos verlo en la cita de seguimiento!  Hoy discutimos: Tiene una infeccin del tracto respiratorio superior que se est resolviendo. Contine tomando Dayquil, ibuprofeno y tylenol para sus sntomas. Limite el ibuprofeno a 1200 mg por da y el tylenol a 3000 mg por da. Podemos hacer algunos [employer] medicamentos para su dolor, pero nos gustara verlo cuando no tenga una infeccin. Le daremos robaxin para los dolores musculares que usted padece ya que es un problema crnico para usted.  He ordenado los siguientes laboratorios hoy:  rdenes de laboratorio No se han solicitado pruebas de laboratorio hoy     Referencias ordenadas hoy:  rdenes de referencia No se solicitaron referencias hoy    He ordenado/cambiado los siguientes medicamentos:  Suspenda los siguientes medicamentos: No hay medicamentos discontinuados.  Inicie los siguientes medicamentos: Los mdicos ordenaron este encuentro Medicamentos  metocarbamol (ROBAXIN-750) tableta de 750 MG Sig: Tome 1 tableta (750 mg en total) por va oral 4 (cuatro) veces al da durante [address] Dispensacin: 60 comprimidos Recarga: 0    Seguimiento: seguimiento de 3-4 semanas  Asegrese de llegar 15 minutos antes de su prxima cita. Si llega tarde, es posible que le soliciten reprogramar su cita.  Esperamos verte la prxima vez.  Llame a nuestra clnica al [phone] si tiene alguna pregunta o inquietud. El mejor momento para llamar es de lunes a viernes de 9 a. m. a 4 p. m., pero hay alguien disponible las 24 horas, los 7 das de la [place]. Si es fuera del horario de atencin o durante el fin de [place], llame al nmero principal del hospital y pregunte por el residente de guardia de medicina interna. Si necesita reabastecimiento de medicamentos, notifique a  su farmacia con una semana de anticipacin y ellos nos enviarn una solicitud.  Gracias por permitirnos participar en su cuidado. Deseandote lo mejor!  Karen Siad, MD

## 2022-03-05 NOTE — Assessment & Plan Note (Signed)
Patient's symptoms consistent with URI that is resolving. She has not maximized conservative treatment as she is only using 200 mg of ibuprofen daily. Advised her to use tylenol and ibuprofen and their daily limits. Advised her to continue using dayquil. I advised her to make an appointment once her URI has resolved to discuss her chronic pains at that time. Will give Robaxin as pt requesting tizanidine for neck and shoulder pains. She has hx of chronic pain including back pain that she receives epidural shots.  -Continue conservative measrues -3 to 4 week follow up for care gaps and chronic pain concern.

## 2022-03-10 NOTE — Progress Notes (Signed)
Internal Medicine Clinic Attending  Case discussed with Dr. Khan  At the time of the visit.  We reviewed the resident's history and exam and pertinent patient test results.  I agree with the assessment, diagnosis, and plan of care documented in the resident's note.  

## 2022-03-22 ENCOUNTER — Other Ambulatory Visit: Payer: Self-pay | Admitting: Internal Medicine

## 2022-03-22 DIAGNOSIS — F419 Anxiety disorder, unspecified: Secondary | ICD-10-CM

## 2022-03-24 ENCOUNTER — Telehealth: Payer: Self-pay | Admitting: *Deleted

## 2022-03-24 ENCOUNTER — Other Ambulatory Visit: Payer: Self-pay | Admitting: Internal Medicine

## 2022-03-24 DIAGNOSIS — M5416 Radiculopathy, lumbar region: Secondary | ICD-10-CM

## 2022-03-24 NOTE — Telephone Encounter (Signed)
Call from patent unable to get refill on her Klonopin.  Call to Pharmacy needed to verify DEA # of the prescribing physician.  RTC from pharmacy were able to get prescription to go through for claim.  Call to Patient informed her that her Klonopin prescription will be ready for pick up in about an hour.

## 2022-03-25 NOTE — Telephone Encounter (Signed)
Refilled her methocarbamol.

## 2022-04-05 ENCOUNTER — Encounter (HOSPITAL_COMMUNITY): Payer: Self-pay

## 2022-04-05 ENCOUNTER — Other Ambulatory Visit: Payer: Self-pay

## 2022-04-05 ENCOUNTER — Emergency Department (HOSPITAL_COMMUNITY): Payer: BC Managed Care – PPO

## 2022-04-05 ENCOUNTER — Emergency Department (HOSPITAL_COMMUNITY)
Admission: EM | Admit: 2022-04-05 | Discharge: 2022-04-05 | Discharge status: Against Medical Advice | Payer: BC Managed Care – PPO | Attending: Student | Admitting: Student

## 2022-04-05 DIAGNOSIS — Z5321 Procedure and treatment not carried out due to patient leaving prior to being seen by health care provider: Secondary | ICD-10-CM | POA: Insufficient documentation

## 2022-04-05 DIAGNOSIS — R002 Palpitations: Secondary | ICD-10-CM | POA: Diagnosis not present

## 2022-04-05 DIAGNOSIS — R079 Chest pain, unspecified: Secondary | ICD-10-CM | POA: Insufficient documentation

## 2022-04-05 DIAGNOSIS — R070 Pain in throat: Secondary | ICD-10-CM | POA: Insufficient documentation

## 2022-04-05 DIAGNOSIS — R0602 Shortness of breath: Secondary | ICD-10-CM | POA: Diagnosis not present

## 2022-04-05 DIAGNOSIS — R519 Headache, unspecified: Secondary | ICD-10-CM | POA: Insufficient documentation

## 2022-04-05 DIAGNOSIS — R0789 Other chest pain: Secondary | ICD-10-CM | POA: Diagnosis not present

## 2022-04-05 LAB — BASIC METABOLIC PANEL
Anion gap: 10 (ref 5–15)
BUN: 13 mg/dL (ref 6–20)
CO2: 23 mmol/L (ref 22–32)
Calcium: 9.5 mg/dL (ref 8.9–10.3)
Chloride: 102 mmol/L (ref 98–111)
Creatinine, Ser: 0.77 mg/dL (ref 0.44–1.00)
GFR, Estimated: >60 mL/min (ref >60)
Glucose, Bld: 109 mg/dL — ABNORMAL HIGH (ref 70–99)
Potassium: 3.8 mmol/L (ref 3.5–5.1)
Sodium: 135 mmol/L (ref 135–145)

## 2022-04-05 LAB — CBC
HCT: 38.7 % (ref 36.0–46.0)
Hemoglobin: 12.6 g/dL (ref 12.0–15.0)
MCH: 30.7 pg (ref 26.0–34.0)
MCHC: 32.6 g/dL (ref 30.0–36.0)
MCV: 94.4 fL (ref 80.0–100.0)
Platelets: 289 10*3/uL (ref 150–400)
RBC: 4.1 MIL/uL (ref 3.87–5.11)
RDW: 12.4 % (ref 11.5–15.5)
WBC: 7.4 10*3/uL (ref 4.0–10.5)
nRBC: 0 % (ref 0.0–0.2)

## 2022-04-05 LAB — I-STAT BETA HCG BLOOD, ED (MC, WL, AP ONLY): I-stat hCG, quantitative: <5 m[IU]/mL (ref <5)

## 2022-04-05 LAB — TROPONIN I (HIGH SENSITIVITY): Troponin I (High Sensitivity): 3 ng/L (ref <18)

## 2022-04-05 NOTE — ED Notes (Signed)
Pt stated that they no longer wanted to wait

## 2022-04-05 NOTE — ED Provider Triage Note (Signed)
Emergency Medicine Provider Triage Evaluation Note  Karen [name] , a 56 y.o. female  was evaluated in triage.  Pt complains of chest pain for 4 to 5 days.  Feels sharp.  In the middle of her chest and does not radiate anywhere.  Associated shortness of breath and throat discomfort.  Also has been having palpitations.  This morning she started to have a headache as well.  Rates her discomfort 10 out of 10  Review of Systems  Positive: As above Negative:   Physical Exam  Ht 5\' 3"  (1.6 m)   Wt 54.4 kg   LMP 02/08/2006   BMI 21.26 kg/m  Gen:   Awake, no distress   Resp:  Normal effort  MSK:   Moves extremities without difficulty  Other:  RRR, lung sounds clear.  Airway clear and tolerating secretions, no signs of exudate.  Nontender or distended abdomen  Medical Decision Making  Medically screening exam initiated at 5:02 PM.  Appropriate orders placed.  [name] was informed that the remainder of the evaluation will be completed by another provider, this initial triage assessment does not replace that evaluation, and the importance of remaining in the ED until their evaluation is complete.  Chest pain work-up   Breckyn Ticas A, PA-C 04/05/22 1703

## 2022-04-05 NOTE — ED Triage Notes (Signed)
COmplains of headache and chest pain x 5-6 days.  +SOB Reports pain radiates to left arm and back.

## 2022-04-06 ENCOUNTER — Encounter (HOSPITAL_COMMUNITY): Payer: Self-pay | Admitting: *Deleted

## 2022-04-06 ENCOUNTER — Ambulatory Visit (HOSPITAL_COMMUNITY)
Admission: EM | Admit: 2022-04-06 | Discharge: 2022-04-06 | Disposition: A | Discharge status: Home / Self Care | Payer: BC Managed Care – PPO | Attending: Urgent Care | Admitting: Urgent Care

## 2022-04-06 DIAGNOSIS — M7918 Myalgia, other site: Secondary | ICD-10-CM | POA: Insufficient documentation

## 2022-04-06 DIAGNOSIS — J040 Acute laryngitis: Secondary | ICD-10-CM | POA: Insufficient documentation

## 2022-04-06 DIAGNOSIS — G8929 Other chronic pain: Secondary | ICD-10-CM | POA: Insufficient documentation

## 2022-04-06 DIAGNOSIS — N760 Acute vaginitis: Secondary | ICD-10-CM | POA: Insufficient documentation

## 2022-04-06 DIAGNOSIS — R0789 Other chest pain: Secondary | ICD-10-CM | POA: Insufficient documentation

## 2022-04-06 DIAGNOSIS — Z113 Encounter for screening for infections with a predominantly sexual mode of transmission: Secondary | ICD-10-CM | POA: Insufficient documentation

## 2022-04-06 LAB — SEDIMENTATION RATE: Sed Rate: 10 mm/hr (ref 0–22)

## 2022-04-06 LAB — C-REACTIVE PROTEIN: CRP: 0.9 mg/dL (ref <1.0)

## 2022-04-06 MED ORDER — KETOROLAC TROMETHAMINE 60 MG/2ML IM SOLN
60.0000 mg | Freq: Once | INTRAMUSCULAR | Status: AC
  Administered 2022-04-06: 60 mg via INTRAMUSCULAR

## 2022-04-06 MED ORDER — KETOROLAC TROMETHAMINE 60 MG/2ML IM SOLN
INTRAMUSCULAR | Status: AC
  Filled 2022-04-06: qty 2

## 2022-04-06 MED ORDER — PREDNISONE 10 MG (21) PO TBPK: ORAL_TABLET | Freq: Every day | ORAL | 0 refills | Status: DC

## 2022-04-06 NOTE — ED Triage Notes (Signed)
Pt states she went to ER last night but there was a lot of people she left before being seen.   She states that she has headache and sore throat. She has taking OTC meds for this and nothing is helping . She is having chest pain but says her doctor has already ordered test and she had labs and ekg last night. She was given robaxin for the muscle pain but she said she stopped it since it wasn't helping she is taking IBU without relief.    She is also having vagina discharge for a few days. She states that her husband might have given her something. He tested positive for chlamydia. She has been treated in the past.
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
## 2022-04-06 NOTE — Discharge Instructions (Addendum)
Su dolor en el pecho no parece ser cardaco. Sospecho que esto es inflamatorio. Le aplicaron una inyeccin de un medicamento antiinflamatorio. Tambin le recetaron prednisona. Siga esto segn las instrucciones del paquete. Llamaremos con los [employer] laboratorios si son positivos. Haga un seguimiento con su proveedor de atencin primaria y Microbiologist un medicamento llamado Cymbalta, ya que puede ayudar con el dolor crnico.

## 2022-04-07 LAB — CERVICOVAGINAL ANCILLARY ONLY
Bacterial Vaginitis (gardnerella): POSITIVE — AB
Candida Glabrata: NEGATIVE
Candida Vaginitis: NEGATIVE
Chlamydia: NEGATIVE
Comment: NEGATIVE
Comment: NEGATIVE
Comment: NEGATIVE
Comment: NEGATIVE
Comment: NEGATIVE
Comment: NORMAL
Neisseria Gonorrhea: NEGATIVE
Trichomonas: NEGATIVE

## 2022-04-08 ENCOUNTER — Telehealth (HOSPITAL_COMMUNITY): Payer: Self-pay | Admitting: Emergency Medicine

## 2022-04-08 MED ORDER — METRONIDAZOLE 500 MG PO TABS: 500.0000 mg | ORAL_TABLET | Freq: Two times a day (BID) | ORAL | 0 refills | Status: DC

## 2022-04-13 ENCOUNTER — Ambulatory Visit (INDEPENDENT_AMBULATORY_CARE_PROVIDER_SITE_OTHER): Payer: BC Managed Care – PPO | Admitting: Student

## 2022-04-13 VITALS — BP 126/50 | HR 65 | Temp 98.2°F | Wt 123.5 lb

## 2022-04-13 DIAGNOSIS — R490 Dysphonia: Secondary | ICD-10-CM

## 2022-04-13 DIAGNOSIS — Z23 Encounter for immunization: Secondary | ICD-10-CM | POA: Diagnosis not present

## 2022-04-13 DIAGNOSIS — Z0000 Encounter for general adult medical examination without abnormal findings: Secondary | ICD-10-CM

## 2022-04-13 NOTE — Patient Instructions (Addendum)
Hoarseness Please follow up with ENT If your symptoms are improved you can cancel your appointment  Follow up in 1 month   Ronquera Por favor haga un seguimiento con otorrinolaringlogo. Si tus sntomas mejoran puedes cancelar tu cita.  Seguimiento en 1 mes

## 2022-04-19 ENCOUNTER — Telehealth: Payer: Self-pay

## 2022-04-19 NOTE — Telephone Encounter (Signed)
Called pt to find out which test she's referring about. Person who speaks English added to conversation per pt. He stated pt wanted to know about ENT referral - informed it is in progress but I will have Chilon B to f/u.

## 2022-04-19 NOTE — Telephone Encounter (Signed)
Pt is requesting a call back .Karen [name] She is wanting her results from her last OV

## 2022-04-20 NOTE — Telephone Encounter (Signed)
Called pt - informed referral is being worked on and she will receive a call when an appt is scheduled. She stated "ok".

## 2022-04-21 DIAGNOSIS — R490 Dysphonia: Secondary | ICD-10-CM | POA: Insufficient documentation

## 2022-04-21 NOTE — Assessment & Plan Note (Signed)
>>  ASSESSMENT AND PLAN FOR HOARSENESS WRITTEN ON 04/21/2022  8:36 AM BY Quincy Simmonds, MD  Patient presents for 3 weeks of hoarseness. Reports she woke up one day and noticed this. Denies any vocal strain or using voice more than usual. She was evaluated at urgent care on 10/17 for this. Thought she may have a viral URI that may be causing this and prescribed prednisone taper which she has been taking for about 3 days without improvement. She denies cough, fever or chills, but endorses sinus pain with drainage and left ear pain as well. Also notes dysphagia with liquids and solids that started when her symptoms started, but has been able to eat and drink some since this started. Denies reflux type symptoms. Has been very anxious since this started. Voice is markedly hoarse during interview and progressive softer with more speaking.  Vitals stable. Exam is unremarkable aside from mild erythema of the left nare. She has been using oxymethazoline nasal spray which may be causing irrtation. Advised that she stop using this to see if this helps with sinus pain. This may be due to a viral URI although would expect her to have some improvement by now. Will send referral for ENT for further evaluation.
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
## 2022-04-21 NOTE — Assessment & Plan Note (Addendum)
Patient presents for 3 weeks of hoarseness. Reports she woke up one day and noticed this. Denies any vocal strain or using voice more than usual. She was evaluated at urgent care on 10/17 for this. Thought she may have a viral URI that may be causing this and prescribed prednisone taper which she has been taking for about 3 days without improvement. She denies cough, fever or chills, but endorses sinus pain with drainage and left ear pain as well. Also notes dysphagia with liquids and solids that started when her symptoms started, but has been able to eat and drink some since this started. Denies reflux type symptoms. Has been very anxious since this started. Voice is markedly hoarse during interview and progressive softer with more speaking.  Vitals stable. Exam is unremarkable aside from mild erythema of the left nare. She has been using oxymethazoline nasal spray which may be causing irrtation. Advised that she stop using this to see if this helps with sinus pain. This may be due to a viral URI although would expect her to have some improvement by now. Will send referral for ENT for further evaluation.

## 2022-04-21 NOTE — Assessment & Plan Note (Signed)
Flu vaccine administered

## 2022-04-22 NOTE — Progress Notes (Signed)
Internal Medicine Clinic Attending ? ?Case discussed with Dr. Liang  At the time of the visit.  We reviewed the resident?s history and exam and pertinent patient test results.  I agree with the assessment, diagnosis, and plan of care documented in the resident?s note. ? ?

## 2022-04-23 ENCOUNTER — Other Ambulatory Visit: Payer: Self-pay

## 2022-04-23 DIAGNOSIS — F419 Anxiety disorder, unspecified: Secondary | ICD-10-CM

## 2022-04-23 MED ORDER — CLONAZEPAM 0.5 MG PO TABS: ORAL_TABLET | ORAL | 0 refills | Status: DC

## 2022-04-23 NOTE — Telephone Encounter (Signed)
MED REFILL REQUEST  clonazePAM (KLONOPIN) 0.5 MG tablet   [id number] - , [place] - [address] Phone: [phone]  Fax: [phone]

## 2022-04-28 ENCOUNTER — Emergency Department (HOSPITAL_COMMUNITY)
Admission: EM | Admit: 2022-04-28 | Discharge: 2022-04-28 | Discharge status: Against Medical Advice | Payer: BC Managed Care – PPO | Attending: Medical | Admitting: Medical

## 2022-04-28 ENCOUNTER — Other Ambulatory Visit: Payer: Self-pay

## 2022-04-28 DIAGNOSIS — R5383 Other fatigue: Secondary | ICD-10-CM | POA: Insufficient documentation

## 2022-04-28 DIAGNOSIS — J029 Acute pharyngitis, unspecified: Secondary | ICD-10-CM | POA: Diagnosis not present

## 2022-04-28 DIAGNOSIS — M7918 Myalgia, other site: Secondary | ICD-10-CM | POA: Diagnosis not present

## 2022-04-28 DIAGNOSIS — Z5321 Procedure and treatment not carried out due to patient leaving prior to being seen by health care provider: Secondary | ICD-10-CM | POA: Insufficient documentation

## 2022-04-28 DIAGNOSIS — I10 Essential (primary) hypertension: Secondary | ICD-10-CM | POA: Diagnosis not present

## 2022-04-28 DIAGNOSIS — M549 Dorsalgia, unspecified: Secondary | ICD-10-CM | POA: Diagnosis not present

## 2022-04-28 DIAGNOSIS — Z20822 Contact with and (suspected) exposure to covid-19: Secondary | ICD-10-CM | POA: Insufficient documentation

## 2022-04-28 DIAGNOSIS — R519 Headache, unspecified: Secondary | ICD-10-CM | POA: Diagnosis not present

## 2022-04-28 LAB — RESP PANEL BY RT-PCR (FLU A&B, COVID) ARPGX2
Influenza A by PCR: NEGATIVE
Influenza B by PCR: NEGATIVE
SARS Coronavirus 2 by RT PCR: NEGATIVE

## 2022-04-28 LAB — MONONUCLEOSIS SCREEN: Mono Screen: NEGATIVE

## 2022-04-28 LAB — GROUP A STREP BY PCR: Group A Strep by PCR: NOT DETECTED

## 2022-04-28 MED ORDER — IBUPROFEN 800 MG PO TABS
800.0000 mg | ORAL_TABLET | Freq: Once | ORAL | Status: AC
  Administered 2022-04-28: 800 mg via ORAL
  Filled 2022-04-28: qty 1

## 2022-04-28 NOTE — ED Triage Notes (Signed)
Pt/translator stated, I've had headache, sore throat and feel fatigue for a month.

## 2022-04-28 NOTE — ED Notes (Signed)
Pt Is no longer in the lobby and not answering when being called for Sort.

## 2022-04-28 NOTE — ED Provider Triage Note (Signed)
Emergency Medicine Provider Triage Evaluation Note  Karen [name] , a 56 y.o. female  was evaluated in triage.    History of IBS, migraines, chronic pain, hypertension.  Patient presents today for evaluation of sore throat that is been intermittent for 1 month she reports with prolonged speaking she has a hoarse voice and increased pain.  Associated symptoms including intermittent headache and body aches.  Review of Systems  Positive: Sore throat Negative: Fever, fall, swelling, vomiting, diarrhea, cough, shortness of breath.  Physical Exam  BP 126/66 (BP Location: Right Arm)   Pulse 60   Temp 98.2 F (36.8 C) (Oral)   LMP 02/08/2006   SpO2 99%  Gen:   Awake, no distress   Resp:  Normal effort  MSK:   Moves extremities without difficulty  Other:  Airway clear, mild posterior pharynx cobblestoning.  No submandibular swelling.  Neck soft and supple without swelling.  Medical Decision Making  Medically screening exam initiated at 10:39 AM.  Appropriate orders placed.  Karen [name] was informed that the remainder of the evaluation will be completed by another provider, this initial triage assessment does not replace that evaluation, and the importance of remaining in the ED until their evaluation is complete.  Note: Portions of this report may have been transcribed using voice recognition software. Every effort was made to ensure accuracy; however, inadvertent computerized transcription errors may still be present.    Deliah Boston, PA-C 04/28/22 1042

## 2022-04-29 ENCOUNTER — Telehealth: Payer: Self-pay | Admitting: *Deleted

## 2022-04-29 NOTE — Telephone Encounter (Signed)
Pt emergency contact called regarding results of labs.  He explained that pt left AMA due to being in waiting room 13 hours.  RNCM advised to utilize mychart as I am unable to relay test/lab results.

## 2022-05-18 ENCOUNTER — Emergency Department (HOSPITAL_COMMUNITY)
Admission: EM | Admit: 2022-05-18 | Discharge: 2022-05-18 | Disposition: A | Discharge status: Home / Self Care | Payer: BC Managed Care – PPO | Attending: Emergency Medicine | Admitting: Emergency Medicine

## 2022-05-18 ENCOUNTER — Emergency Department (HOSPITAL_COMMUNITY): Payer: BC Managed Care – PPO

## 2022-05-18 DIAGNOSIS — R079 Chest pain, unspecified: Secondary | ICD-10-CM | POA: Diagnosis not present

## 2022-05-18 DIAGNOSIS — J0110 Acute frontal sinusitis, unspecified: Secondary | ICD-10-CM | POA: Insufficient documentation

## 2022-05-18 DIAGNOSIS — R52 Pain, unspecified: Secondary | ICD-10-CM

## 2022-05-18 DIAGNOSIS — R109 Unspecified abdominal pain: Secondary | ICD-10-CM | POA: Insufficient documentation

## 2022-05-18 DIAGNOSIS — R0789 Other chest pain: Secondary | ICD-10-CM

## 2022-05-18 DIAGNOSIS — U071 COVID-19: Secondary | ICD-10-CM | POA: Diagnosis not present

## 2022-05-18 DIAGNOSIS — R059 Cough, unspecified: Secondary | ICD-10-CM | POA: Diagnosis not present

## 2022-05-18 DIAGNOSIS — J9811 Atelectasis: Secondary | ICD-10-CM | POA: Diagnosis not present

## 2022-05-18 DIAGNOSIS — J029 Acute pharyngitis, unspecified: Secondary | ICD-10-CM | POA: Diagnosis not present

## 2022-05-18 DIAGNOSIS — R634 Abnormal weight loss: Secondary | ICD-10-CM | POA: Diagnosis not present

## 2022-05-18 LAB — BASIC METABOLIC PANEL
Anion gap: 10 (ref 5–15)
BUN: 11 mg/dL (ref 6–20)
CO2: 22 mmol/L (ref 22–32)
Calcium: 9.5 mg/dL (ref 8.9–10.3)
Chloride: 107 mmol/L (ref 98–111)
Creatinine, Ser: 0.68 mg/dL (ref 0.44–1.00)
GFR, Estimated: >60 mL/min (ref >60)
Glucose, Bld: 95 mg/dL (ref 70–99)
Potassium: 3.6 mmol/L (ref 3.5–5.1)
Sodium: 139 mmol/L (ref 135–145)

## 2022-05-18 LAB — TROPONIN I (HIGH SENSITIVITY): Troponin I (High Sensitivity): 2 ng/L (ref <18)

## 2022-05-18 LAB — RESP PANEL BY RT-PCR (FLU A&B, COVID) ARPGX2
Influenza A by PCR: NEGATIVE
Influenza B by PCR: NEGATIVE
SARS Coronavirus 2 by RT PCR: POSITIVE — AB

## 2022-05-18 LAB — CBC
HCT: 36.8 % (ref 36.0–46.0)
Hemoglobin: 12.1 g/dL (ref 12.0–15.0)
MCH: 31.3 pg (ref 26.0–34.0)
MCHC: 32.9 g/dL (ref 30.0–36.0)
MCV: 95.1 fL (ref 80.0–100.0)
Platelets: 308 10*3/uL (ref 150–400)
RBC: 3.87 MIL/uL (ref 3.87–5.11)
RDW: 12.5 % (ref 11.5–15.5)
WBC: 4.7 10*3/uL (ref 4.0–10.5)
nRBC: 0 % (ref 0.0–0.2)

## 2022-05-18 LAB — D-DIMER, QUANTITATIVE: D-Dimer, Quant: 0.36 ug/mL-FEU (ref 0.00–0.50)

## 2022-05-18 MED ORDER — ONDANSETRON HCL 4 MG PO TABS: 4.0000 mg | ORAL_TABLET | Freq: Four times a day (QID) | ORAL | 0 refills | Status: DC

## 2022-05-18 MED ORDER — KETOROLAC TROMETHAMINE 15 MG/ML IJ SOLN
15.0000 mg | Freq: Once | INTRAMUSCULAR | Status: AC
  Administered 2022-05-18: 15 mg via INTRAVENOUS
  Filled 2022-05-18: qty 1

## 2022-05-18 MED ORDER — KETOROLAC TROMETHAMINE 15 MG/ML IJ SOLN: 15.0000 mg | Freq: Once | INTRAMUSCULAR | Status: DC

## 2022-05-18 MED ORDER — ONDANSETRON HCL 4 MG/2ML IJ SOLN
4.0000 mg | Freq: Once | INTRAMUSCULAR | Status: AC
  Administered 2022-05-18: 4 mg via INTRAVENOUS
  Filled 2022-05-18: qty 2

## 2022-05-18 MED ORDER — AMOXICILLIN-POT CLAVULANATE 875-125 MG PO TABS: 1.0000 | ORAL_TABLET | Freq: Two times a day (BID) | ORAL | 0 refills | Status: DC

## 2022-05-18 MED ORDER — LACTATED RINGERS IV BOLUS
1000.0000 mL | Freq: Once | INTRAVENOUS | Status: AC
  Administered 2022-05-18: 1000 mL via INTRAVENOUS

## 2022-05-18 MED ORDER — ACETAMINOPHEN 500 MG PO TABS
1000.0000 mg | ORAL_TABLET | Freq: Once | ORAL | Status: AC
  Administered 2022-05-18: 1000 mg via ORAL
  Filled 2022-05-18: qty 2

## 2022-05-18 MED ORDER — AMOXICILLIN-POT CLAVULANATE 875-125 MG PO TABS
1.0000 | ORAL_TABLET | Freq: Once | ORAL | Status: AC
  Administered 2022-05-18: 1 via ORAL
  Filled 2022-05-18: qty 1

## 2022-05-18 NOTE — ED Provider Notes (Signed)
[facility] DEPT Provider Note   CSN: [id number] Arrival date & time: 05/18/22  4401     History  Chief Complaint  Patient presents with   Chest Pain    Karen [name] is a 56 y.o. female with PAD, chronic pain syndrome, latent TB, migraines, insomnia who presents with chest pain.   Presents with two weeks of hoarseness, anorexia, 8 lbs of weight loss, cough worst at night with copious transparent phlegm, and associated left chest, left back, and left sided abdominal pain that are tender to palpation worse with coughing.  She was seen by another physician and prescribed muscle relaxers which she feels helps with this pain. Has had significant fatigue as well so much that she is unable to go to work for the whole day. Also endorses nausea/vomiting, diarrhea, and all-over body aches.  Subjective fever/chills.  She is taking Tylenol and ibuprofen with no relief at home.  Endorses a frontal headache with copious nasal discharge that is also been going on for the same timeframe. Has also had hoarseness for 3 months that is worst when she talks a lot.  Is already being followed for this.  No lightheadedness, no shortness of breath or urinary symptoms.  No melena/hematochezia.  No leg swelling or pain.   Chest Pain      Home Medications Prior to Admission medications   Medication Sig Start Date End Date Taking? Authorizing Provider  amoxicillin-clavulanate (AUGMENTIN) 875-125 MG tablet Take 1 tablet by mouth every 12 (twelve) hours. 05/18/22  Yes Audley Hose, MD  ondansetron (ZOFRAN) 4 MG tablet Take 1 tablet (4 mg total) by mouth every 6 (six) hours. 05/18/22  Yes Audley Hose, MD  clonazePAM (KLONOPIN) 0.5 MG tablet TAKE 1 TABLET (0.5 MG TOTAL) BY MOUTH DAILY AS NEEDED FOR ANXIETY. 04/23/22   Virl Axe, MD  metroNIDAZOLE (FLAGYL) 500 MG tablet Take 1 tablet (500 mg total) by mouth 2 (two) times daily. 04/08/22   Lamptey, Myrene Galas, MD   predniSONE (STERAPRED UNI-PAK 21 TAB) 10 MG (21) TBPK tablet Take by mouth daily. Take 6 tabs by mouth daily  for 1 days, then 5 tabs for 1 days, then 4 tabs for 1 days, then 3 tabs for 1 days, 2 tabs for 1 days, then 1 tab by mouth daily for 1 days 04/06/22   Elmer Ramp, Whitney L, PA  sertraline (ZOLOFT) 50 MG tablet Take 50 mg by mouth daily. 07/06/17   [provider]      Allergies    Hydromorphone hcl and Oxycodone-acetaminophen    Review of Systems   Review of Systems  Cardiovascular:  Positive for chest pain.   Review of systems Negative for lightheadedness, palpitations.  A 10 point review of systems was performed and is negative unless otherwise reported in HPI.  Physical Exam Updated Vital Signs BP 124/89   Pulse (!) 57   Temp 98.2 F (36.8 C) (Oral)   Resp 18   LMP 02/08/2006   SpO2 100%  Physical Exam General: Normal appearing female, lying in bed.  HEENT: PERRLA, quiet conjunctivae, Sclera anicteric, dry mucous membranes, active rhinorrhea, trachea midline.  Erythematous oropharynx, tonsillar pillars symmetric without any inflammation.  Neck is supple.  Bilateral tympanic membranes mildly erythematous with no fluid behind the membrane. TTP with pressure of the frontal and maxillary sinuses.  Cardiology: RRR, no murmurs/rubs/gallops. BL radial and DP pulses equal bilaterally.  Chest wall muscles are tender to palpation on the front left side.  Resp: Normal respiratory rate and effort. CTAB, no wheezes, rhonchi, crackles.  Abd: Soft, non-tender, non-distended. No rebound tenderness or guarding.  GU: Deferred. MSK: No peripheral edema or signs of trauma. Extremities without deformity or TTP. No cyanosis or clubbing. Skin: warm, dry. No rashes or lesions. Back: No CVA tenderness Neuro: A&Ox4, CNs II-XII grossly intact. MAEs. Sensation grossly intact.  Psych: Normal mood and affect.   ED Results / Procedures / Treatments   Labs (all labs ordered are listed, but only  abnormal results are displayed) Labs Reviewed  RESP PANEL BY RT-PCR (FLU A&B, COVID) ARPGX2 - Abnormal; Notable for the following components:      Result Value   SARS Coronavirus 2 by RT PCR POSITIVE (*)    All other components within normal limits  BASIC METABOLIC PANEL  CBC  D-DIMER, QUANTITATIVE  TROPONIN I (HIGH SENSITIVITY)    EKG EKG Interpretation  Date/Time:  Tuesday May 18 2022 06:52:49 EST Ventricular Rate:  72 PR Interval:  101 QRS Duration: 82 QT Interval:  418 QTC Calculation: 458 R Axis:   88 Text Interpretation: Sinus rhythm Short PR interval Low voltage, precordial leads Borderline T abnormalities, anterior leads Confirmed by Cindee Lame [phone]) on 05/18/2022 8:38:28 PM  Radiology DG Chest 2 View  Result Date: 05/18/2022 CLINICAL DATA:  Headache. Left-sided chest pain. Weight loss. Sore throat. EXAM: CHEST - 2 VIEW COMPARISON:  04/05/2022 FINDINGS: Heart size is normal. There is mild linear atelectasis in the lingula, most easily visible on the lateral projection. The lungs otherwise appear clear. No lobar consolidation or collapse. No visible effusion. No abnormal bone finding. IMPRESSION: Mild linear atelectasis in the lingula. No lobar consolidation or collapse. Electronically Signed   By: Nelson Chimes M.D.   On: 05/18/2022 07:19    Procedures Procedures    Medications Ordered in ED Medications  acetaminophen (TYLENOL) tablet 1,000 mg (1,000 mg Oral Given 05/18/22 1949)  ondansetron (ZOFRAN) injection 4 mg (4 mg Intravenous Given 05/18/22 1950)  lactated ringers bolus 1,000 mL (0 mLs Intravenous Stopped 05/18/22 2106)  ketorolac (TORADOL) 15 MG/ML injection 15 mg (15 mg Intravenous Given 05/18/22 1950)  amoxicillin-clavulanate (AUGMENTIN) 875-125 MG per tablet 1 tablet (1 tablet Oral Given 05/18/22 2103)    ED Course/ Medical Decision Making/ A&P                          Medical Decision Making Amount and/or Complexity of Data Reviewed Labs:  ordered. Decision-making details documented in ED Course. Radiology: ordered. Decision-making details documented in ED Course.  Risk OTC drugs. Prescription drug management.    Patient's symptoms overall are consistent with a systemic viral illness, such as COVID or the flu.  Given her duration of symptoms consider pneumonia with cough/subjective fever and chest pain as well as possible sinusitis given frontal headache and copious nasal discharge with fever.  Given nausea vomiting diarrhea for timeframe will assess for electrolyte abnormalities or renal injury, patient has dry oral mucosa and is likely dehydrated though she is vitally stable and overall well-appearing.  Patient is hoarse probably from pharyngitis but has been followed at this before.  Patient states that it improves until she talks very frequently, consistent with pharyngitis.  Patient also endorses left ear pain, on examination tympanic membrane is not consistent with a bacterial AOM.  Patient's abdomen is soft and nontender, low concern for acute abdominal infection such as appendicitis/cholecystitis, and patient has had no urinary symptoms to suggest UTI.  With chest pain and cough, considered PE but no signs/symptoms of DVT, no SOB or hypoxia, and d-dimer is wnl.  Also considered ACS/arrhythmia given report of chest pain, however her EKG is without any evidence of ischemia and troponin is normal.  No Electra abnormalities or renal injury.  COVID test is positive.  Patient feels somewhat improved w/ toradol, tylenol, zofran, and 1L IVF. She is also given augmentin here and an rx for 7 days for sinusitis given course of symptoms >2 weeks. Also instructed her to continue Tylenol and ibuprofen at home and isolate for her COVID-19.  Given her tenderness to palpation of her chest wall and reassuring workup I believe that patient has muscular chest and back pain as a result of coughing and or bodyaches.  Patient will be discharged with  discharge instructions and return precautions.  Considered admission for this patient but do not believe it is necessary at this time.  All questions answered to patient's satisfaction.  I have personally reviewed and interpreted all labs and imaging.    For further updates please see ED course.  ED Course:  Clinical Course as of 05/18/22 2119  Tue May 18, 2022  1802 Troponin I (High Sensitivity): 2 [HN]  1941 Basic metabolic panel wnl [HN]  7408 CBC wnl [HN]  1802 DG Chest 2 View FINDINGS: Heart size is normal. There is mild linear atelectasis in the lingula, most easily visible on the lateral projection. The lungs otherwise appear clear. No lobar consolidation or collapse. No visible effusion. No abnormal bone finding.  IMPRESSION: Mild linear atelectasis in the lingula. No lobar consolidation or collapse.   [HN]  2034 D-Dimer, Quant: 0.36 [HN]  2049 SARS Coronavirus 2 by RT PCR(!): POSITIVE [HN]    Clinical Course User Index [HN] Audley Hose, MD          Final Clinical Impression(s) / ED Diagnoses Final diagnoses:  Acute non-recurrent frontal sinusitis  Chest wall pain  Body aches    Rx / DC Orders ED Discharge Orders          Ordered    amoxicillin-clavulanate (AUGMENTIN) 875-125 MG tablet  Every 12 hours        05/18/22 2036    ondansetron (ZOFRAN) 4 MG tablet  Every 6 hours        05/18/22 2036             This note was created using dictation software, which may contain spelling or grammatical errors.    Audley Hose, MD 05/18/22 2138

## 2022-05-18 NOTE — Discharge Instructions (Addendum)
Gracias por venir al [facility]. Lo atendieron por una variedad de sntomas. Hicimos un examen, laboratorios e imgenes, y estos mostraron probablemente sinusitis y Ardelia Mems infeccin viral sistmica. Le hemos recetado Augmentin para tomar [employer] al da durante 7 das para la infeccin de los senos nasales y Zofran para tomar cada 6 horas segn sea necesario para las nuseas. Contine alternando tylenol e ibuprofeno en casa. Por favor mantngase bien hidratado. Haga un seguimiento con su proveedor de atencin primaria dentro de 1 semana.  No dude en regresar al servicio de urgencias o llamar al 911 si experimenta: -Empeoramiento de los sntomas -Aturdimiento, desmayo. -Fiebre/escalofros -Cualquier otra cosa que te preocupe

## 2022-05-18 NOTE — ED Triage Notes (Signed)
Patient arrived with complaints of a headache, left sided chest pain, nv, weight loss, and sore throat for the last two weeks. Taking dayquil and ibuprofen with no relief.

## 2022-05-24 ENCOUNTER — Telehealth: Payer: Self-pay | Admitting: *Deleted

## 2022-05-24 ENCOUNTER — Ambulatory Visit (INDEPENDENT_AMBULATORY_CARE_PROVIDER_SITE_OTHER): Payer: BC Managed Care – PPO | Admitting: Student

## 2022-05-24 ENCOUNTER — Ambulatory Visit (INDEPENDENT_AMBULATORY_CARE_PROVIDER_SITE_OTHER): Payer: BC Managed Care – PPO | Admitting: Internal Medicine

## 2022-05-24 VITALS — BP 142/80 | HR 80 | Temp 98.0°F | Wt 116.1 lb

## 2022-05-24 DIAGNOSIS — J069 Acute upper respiratory infection, unspecified: Secondary | ICD-10-CM | POA: Diagnosis not present

## 2022-05-24 DIAGNOSIS — F419 Anxiety disorder, unspecified: Secondary | ICD-10-CM | POA: Diagnosis not present

## 2022-05-24 MED ORDER — PROMETHAZINE HCL 12.5 MG PO TABS: 12.5000 mg | ORAL_TABLET | Freq: Four times a day (QID) | ORAL | 0 refills | Status: DC | PRN

## 2022-05-24 MED ORDER — CLONAZEPAM 0.5 MG PO TABS: ORAL_TABLET | ORAL | 0 refills | Status: DC

## 2022-05-24 NOTE — Patient Instructions (Addendum)
It was a pleasure seeing you in clinic today.  I am sorry to hear you a feel so unwell. You symptoms are likely due to COVID.  You vomiting and diarrhea may also be worsened by taking antibiotics. Please stop taking Augmentin.  We advise you to get plenty of rest and drink plenty fluids. Please try Gatorade, Pedialyte, or Liquid IV to help with hydration.  You can take tylenol and phenergan as needed for pain, fever, and nausea   Follow up in 1 week to assess symptoms.

## 2022-05-24 NOTE — Assessment & Plan Note (Signed)
Telehealth visit with assistance of phone interpreters [phone] and [phone] for ongoing symptoms of viral URI. Patient was evaluated at [facility] on 11/28 where she was diagnosed with sinusitis and COVID-19 infection, and discharged home with Augmentin 875-125 mg BID for 7 days. She says that overall her throat has been bothering her for about 2 months and she has an appointment with ENT scheduled at the end of this month. Since she was discharged from the ED, she says that she is still feeling quite ill including persistent headache, nausea, lack of appetite, diarrhea, cough that is unchanged, subjective fever, chills, and daily night sweats over about 15 days' time. She has not weighed herself since her ED visit but states her clothes continue to fit looser and she thinks she has had further weight loss. Her voice is hoarse but this is chronic in nature at this time. She is not having difficulty breathing. She does have malaise and aches and does not feel well to leave her home; she has not worked in 2 weeks. No known sick exposures. She is tearful and expresses how poorly she feels multiple times on the phone. Chest x-ray at [facility] 11/28 showed mild linear atelectasis in the lingula with no lobar consolidation or collapse. Remainder of work-up was unremarkable. She has been taking tylenol and ibuprofen without much relief of her symptoms, specifically her headache.  Of note the patient was diagnosed with latent tuberculosis in 2013 after skin test was positive with 16 mm induration on 11/04/2011. She received 6 months of INH therapy at that time.   Assessment:Difficult to fully assess what the underlying etiology of her persistent symptoms is without being able to examine her. If her symptoms were related to bacterial infection I would expect some symptomatic improvement by this point given antibiotic therapy for the past 5 days at least. I would not expect COVID-19 which she has likely been infected with for nearly 2  weeks at this point to be continue to cause her to feel worse though it is possible. I am concerned given her history of latent TB and symptoms like night sweats, cough, fever, and weight loss that she needs further evaluation for tuberculosis. Recent chest x-ray was without findings suggestive of TB, and no apical masses that could possibly explain her chronic hoarseness. Plan:I have advised the patient that she needs to come in for further evaluation. I have messaged the front desk to assist in scheduling an in-person visit ASAP. She will need to remain masked.

## 2022-05-24 NOTE — Progress Notes (Signed)
Internal Medicine Clinic Attending ? ?Case discussed with Dr. Dean  At the time of the visit.  We reviewed the resident?s history and exam and pertinent patient test results.  I agree with the assessment, diagnosis, and plan of care documented in the resident?s note.  ?
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
## 2022-05-24 NOTE — Telephone Encounter (Signed)
Call from pt who stated she's covid +. Stated she had been sick x 2 weeks. Tested covid + 6 days ago per chart; she went to [facility] ED. Hoarse speech. Not feeling better. Telehelalth appt given this am with Dr Marlou Sa @ 1015 Am.

## 2022-05-30 ENCOUNTER — Encounter: Payer: Self-pay | Admitting: Student

## 2022-05-30 NOTE — Assessment & Plan Note (Addendum)
Patient presents after TC visit this morning for follow up of COVD pneumonia. She has been having hoarseness and sinusitis symptoms for the past 2 months but this acutely worsened. Was evaluated in ED on 11/28 and found to be COVID positive. She was discharged on Augmentin for sinusitis infection which she has been taking.   Since then reports she has had more than 5 episodes of emesis daily. Some loose stool as well for the past few days. Also notes associated productive cough, headache, weight loss, subjective fevers, and night sweats. Has difficulty holding down food but was able to eat a burrito last night. Mostly emesis is clear with occasionally small streaks of blood. In office today emesis bag with small amount of clear emesis. Also notes continued constant left sided reproducible chest aching radiating to the left arm. EKG and troponin on 11/28 was negative. Ddimer negative.  Likely atypical chest pain. Suspect related to her continued cough rather than cardiac.   Notably she has a history of latent TB that was treated in 2013. Chest xray 1 week ago in ED without apical or posterior consolidation or hilar opacities suggestive of active TB infection.   Exam with stable vitals. No desaturation with walking. Lung auscultation was unremarkable.  Weight is down 7 pounds from visit on 04/13/2022. Suspect weight loss due to poor po intake. Her antibiotics may be contributing to her GI symptoms. Suspect worsening symptoms are due to acute COVID infection. She is unlikely to benefit from paxlovid as she is fully vaccinated and onset of symptoms was more than 5 days ago. Given stable visits will continue to treat supportively.   -stop augmentin -patient to get plenty of rest, rehydrate with Pedialyte/Gatorade -tylenol for fever and pain -follow up with ENT later this month for f/u of hoarseness. -follow up in 1 week
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
## 2022-05-31 ENCOUNTER — Ambulatory Visit (INDEPENDENT_AMBULATORY_CARE_PROVIDER_SITE_OTHER): Payer: BC Managed Care – PPO | Admitting: Student

## 2022-05-31 DIAGNOSIS — U071 COVID-19: Secondary | ICD-10-CM

## 2022-05-31 MED ORDER — GUAIFENESIN ER 600 MG PO TB12: 600.0000 mg | ORAL_TABLET | Freq: Two times a day (BID) | ORAL | 2 refills | Status: DC

## 2022-05-31 MED ORDER — ALBUTEROL SULFATE HFA 108 (90 BASE) MCG/ACT IN AERS: 2.0000 | INHALATION_SPRAY | Freq: Four times a day (QID) | RESPIRATORY_TRACT | 2 refills | Status: DC | PRN

## 2022-05-31 MED ORDER — GUAIFENESIN ER 600 MG PO TB12: 600.0000 mg | ORAL_TABLET | Freq: Two times a day (BID) | ORAL | 0 refills | Status: AC

## 2022-05-31 MED ORDER — ALBUTEROL SULFATE HFA 108 (90 BASE) MCG/ACT IN AERS: 2.0000 | INHALATION_SPRAY | Freq: Four times a day (QID) | RESPIRATORY_TRACT | 0 refills | Status: DC | PRN

## 2022-06-01 NOTE — Progress Notes (Signed)
Internal Medicine Clinic Attending ? ?Case discussed with Dr. Liang  At the time of the visit.  We reviewed the resident?s history and exam and pertinent patient test results.  I agree with the assessment, diagnosis, and plan of care documented in the resident?s note. ? ?
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
## 2022-06-01 NOTE — Assessment & Plan Note (Addendum)
Telehealth follow up today. States she feels slightly better. Cough with sputum and post tussive emesis but only once a day now. Loose stools twice a day but no liquid diarrhea. Is able to eat and drink. Still has the same chest pain as last visit. No falls. Feels sweaty at night but no fevers. No falls or significant dizziness.  Discussed symptoms of COVID can persist for weeks to months. If not feeling better or symptoms worsening advised in person follow up and possible repeat CXR. However given some improvement in symptoms would continue supportive care. Will send albuterol and mucinex to help with her shortness of breath and cough.

## 2022-06-02 NOTE — Progress Notes (Signed)
Internal Medicine Clinic Attending ? ?Case discussed with Dr. Liang  At the time of the visit.  We reviewed the resident?s history and exam and pertinent patient test results.  I agree with the assessment, diagnosis, and plan of care documented in the resident?s note. ? ?

## 2022-06-09 ENCOUNTER — Telehealth: Payer: Self-pay | Admitting: *Deleted

## 2022-06-09 NOTE — Telephone Encounter (Signed)
Patient call regarding wanting appointment to pain clinic for her injections in her back. I explained to her that she has to call and make her own appointment with the pain clinic that she attends.  And if not been keeping those appointment might need a new referral for pain clinic. She is to call the pain clinic where she is being treated to make appointment.

## 2022-06-09 NOTE — Telephone Encounter (Signed)
Patient called in c/o back pain radiating down right leg for > 1 year. Offered first available appt for 12/27. Patient declined, stating she is working and will be fine. She then ended call.

## 2022-06-22 ENCOUNTER — Other Ambulatory Visit: Payer: Self-pay | Admitting: Otolaryngology

## 2022-06-22 DIAGNOSIS — R490 Dysphonia: Secondary | ICD-10-CM

## 2022-06-22 DIAGNOSIS — J3801 Paralysis of vocal cords and larynx, unilateral: Secondary | ICD-10-CM

## 2022-06-23 ENCOUNTER — Other Ambulatory Visit: Payer: Self-pay

## 2022-06-23 ENCOUNTER — Other Ambulatory Visit (HOSPITAL_COMMUNITY): Payer: Self-pay

## 2022-06-23 DIAGNOSIS — F419 Anxiety disorder, unspecified: Secondary | ICD-10-CM

## 2022-06-23 DIAGNOSIS — R1310 Dysphagia, unspecified: Secondary | ICD-10-CM

## 2022-06-25 ENCOUNTER — Ambulatory Visit
Admission: RE | Admit: 2022-06-25 | Discharge: 2022-06-25 | Disposition: A | Discharge status: Home / Self Care | Payer: Commercial Indemnity | Source: Ambulatory Visit | Attending: Otolaryngology

## 2022-06-25 DIAGNOSIS — J3801 Paralysis of vocal cords and larynx, unilateral: Secondary | ICD-10-CM

## 2022-06-25 DIAGNOSIS — R490 Dysphonia: Secondary | ICD-10-CM

## 2022-06-25 MED ORDER — IOPAMIDOL (ISOVUE-300) INJECTION 61%
80.0000 mL | Freq: Once | INTRAVENOUS | Status: AC | PRN
  Administered 2022-06-25: 80 mL via INTRAVENOUS

## 2022-06-28 NOTE — Telephone Encounter (Signed)
Pt f/u with the following med refill  Original Order: clonazePAM (KLONOPIN) 0.5 MG tablet [[id number]]      Pharmacy  [id number] - [place], [address] [place] [address] Phone: [phone]  Fax: [phone]

## 2022-06-29 MED ORDER — CLONAZEPAM 0.5 MG PO TABS: ORAL_TABLET | ORAL | 0 refills | Status: DC

## 2022-06-29 NOTE — Telephone Encounter (Signed)
Pt calling / stating she has been asking for refill on Klonopin since 1/3. Sending request to [facility].

## 2022-07-01 ENCOUNTER — Telehealth: Payer: Self-pay

## 2022-07-01 ENCOUNTER — Other Ambulatory Visit: Payer: Self-pay

## 2022-07-01 ENCOUNTER — Emergency Department (HOSPITAL_BASED_OUTPATIENT_CLINIC_OR_DEPARTMENT_OTHER)
Admission: EM | Admit: 2022-07-01 | Discharge: 2022-07-01 | Disposition: A | Discharge status: Home / Self Care | Payer: Commercial Managed Care - HMO | Attending: Emergency Medicine | Admitting: Emergency Medicine

## 2022-07-01 ENCOUNTER — Telehealth: Payer: Self-pay | Admitting: Emergency Medicine

## 2022-07-01 ENCOUNTER — Emergency Department (HOSPITAL_BASED_OUTPATIENT_CLINIC_OR_DEPARTMENT_OTHER): Payer: Commercial Managed Care - HMO

## 2022-07-01 DIAGNOSIS — R040 Epistaxis: Secondary | ICD-10-CM | POA: Diagnosis present

## 2022-07-01 DIAGNOSIS — R519 Headache, unspecified: Secondary | ICD-10-CM | POA: Diagnosis not present

## 2022-07-01 DIAGNOSIS — R918 Other nonspecific abnormal finding of lung field: Secondary | ICD-10-CM | POA: Insufficient documentation

## 2022-07-01 DIAGNOSIS — R9389 Abnormal findings on diagnostic imaging of other specified body structures: Secondary | ICD-10-CM

## 2022-07-01 LAB — CBC
HCT: 37.5 % (ref 36.0–46.0)
Hemoglobin: 12.8 g/dL (ref 12.0–15.0)
MCH: 31.4 pg (ref 26.0–34.0)
MCHC: 34.1 g/dL (ref 30.0–36.0)
MCV: 92.1 fL (ref 80.0–100.0)
Platelets: 340 10*3/uL (ref 150–400)
RBC: 4.07 MIL/uL (ref 3.87–5.11)
RDW: 12.3 % (ref 11.5–15.5)
WBC: 7.7 10*3/uL (ref 4.0–10.5)
nRBC: 0 % (ref 0.0–0.2)

## 2022-07-01 LAB — BASIC METABOLIC PANEL
Anion gap: 10 (ref 5–15)
BUN: 17 mg/dL (ref 6–20)
CO2: 24 mmol/L (ref 22–32)
Calcium: 9.8 mg/dL (ref 8.9–10.3)
Chloride: 103 mmol/L (ref 98–111)
Creatinine, Ser: 0.63 mg/dL (ref 0.44–1.00)
GFR, Estimated: >60 mL/min (ref >60)
Glucose, Bld: 93 mg/dL (ref 70–99)
Potassium: 4.3 mmol/L (ref 3.5–5.1)
Sodium: 137 mmol/L (ref 135–145)

## 2022-07-01 LAB — PROTIME-INR
INR: 1 (ref 0.8–1.2)
Prothrombin Time: 12.9 seconds (ref 11.4–15.2)

## 2022-07-01 LAB — HEPATIC FUNCTION PANEL
ALT: 8 U/L (ref 0–44)
AST: 15 U/L (ref 15–41)
Albumin: 4.5 g/dL (ref 3.5–5.0)
Alkaline Phosphatase: 79 U/L (ref 38–126)
Bilirubin, Direct: 0.1 mg/dL (ref 0.0–0.2)
Indirect Bilirubin: 0.5 mg/dL (ref 0.3–0.9)
Total Bilirubin: 0.6 mg/dL (ref 0.3–1.2)
Total Protein: 7.5 g/dL (ref 6.5–8.1)

## 2022-07-01 MED ORDER — SODIUM CHLORIDE 0.9 % IV BOLUS
1000.0000 mL | Freq: Once | INTRAVENOUS | Status: AC
  Administered 2022-07-01: 1000 mL via INTRAVENOUS

## 2022-07-01 MED ORDER — KETOROLAC TROMETHAMINE 15 MG/ML IJ SOLN
15.0000 mg | Freq: Once | INTRAMUSCULAR | Status: AC
  Administered 2022-07-01: 15 mg via INTRAVENOUS
  Filled 2022-07-01: qty 1

## 2022-07-01 MED ORDER — DIPHENHYDRAMINE HCL 25 MG PO CAPS
25.0000 mg | ORAL_CAPSULE | Freq: Once | ORAL | Status: AC
  Administered 2022-07-01: 25 mg via ORAL
  Filled 2022-07-01: qty 1

## 2022-07-01 MED ORDER — ONDANSETRON HCL 4 MG/2ML IJ SOLN
4.0000 mg | Freq: Once | INTRAMUSCULAR | Status: AC
  Administered 2022-07-01: 4 mg via INTRAVENOUS
  Filled 2022-07-01: qty 2

## 2022-07-01 MED ORDER — OXYMETAZOLINE HCL 0.05 % NA SOLN: 1.0000 | Freq: Once | NASAL | Status: DC

## 2022-07-01 MED ORDER — MORPHINE SULFATE (PF) 2 MG/ML IV SOLN
2.0000 mg | Freq: Once | INTRAVENOUS | Status: AC
  Administered 2022-07-01: 2 mg via INTRAVENOUS
  Filled 2022-07-01: qty 1

## 2022-07-01 NOTE — Telephone Encounter (Signed)
Attempted to call pt's spouse but unable to reach. Left message for him to return call.

## 2022-07-01 NOTE — ED Triage Notes (Signed)
Pt arrived POV, caox4, ambulatory. Pt c/o epistaxis and headache every morning for the past 3 days. CP on the L side every day for the past 4 months that she has been evaluated for previously. No active bleeding at present.

## 2022-07-01 NOTE — Discharge Instructions (Signed)
Call your ENT office to discuss your CT results. You can let them know you will be following up with pulmonology.  Pulmonology office will call you to schedule an appointment. If you have not heard from the office by Monday, please call to schedule with the number provided. They will discuss your CT results and possibly a bronchoscopy. I have included information on this test to review ahead of your visit.  Regarding your headaches, please follow up with your primary care provider.  Regarding your nose bleeds- nose bleeds are common this time of year due to dry air. You can use a saline mist spray in your nose and add a humidifier to your room at night. If you continue to have trouble with nosebleeds, please follow up with your ENT.

## 2022-07-01 NOTE — Telephone Encounter (Signed)
Patient is in the [facility] ED today 1/11.  I was contacted regarding an abnormal CT scan of the chest that was done last week.  Reviewed by me, shows mediastinal and hilar lymphadenopathy with central clearing, concerning for possible necrosis.  She needs a consult visit in our office ASAP with any provider who can perform endobronchial ultrasound (probably not Melvyn Novas, [place]) to facilitate workup.  She could be placed in [facility] blocked nodule slot if no sooner openings available.  Please call the patient's husband to set this up, patient is Spanish-speaking only.

## 2022-07-01 NOTE — Telephone Encounter (Signed)
Pt's;boyfriend requesting to speak with a nurse. States pt is having headache, nose bleed and unable to sleep for the past 3 days. Would like to know what should she do. No open slot for today or tomorrow. Please call back.

## 2022-07-01 NOTE — ED Provider Notes (Signed)
[facility] EMERGENCY DEPT Provider Note   CSN: [id number] Arrival date & time: 07/01/22  1049     History  Chief Complaint  Patient presents with   Epistaxis    Karen [name] is a 57 y.o. female.  57 year old Spanish-speaking female presents with her husband, formal translator used for history and evaluation today.  Patient presents with concern for frontal headaches which have been ongoing for the past few months, now with nosebleed x 3 on the left side for the past 3 days.  Reports each nosebleed lasts about 2 to 3 minutes, resolved after placing a moist compress to her forehead.  She is not anticoagulated.  Also reports ongoing discomfort in her chest and back which she is currently being worked up for outpatient, no new or change in symptoms.       Home Medications Prior to Admission medications   Medication Sig Start Date End Date Taking? Authorizing Provider  albuterol (VENTOLIN HFA) 108 (90 Base) MCG/ACT inhaler Inhale 2 puffs into the lungs every 6 (six) hours as needed for wheezing or shortness of breath. 05/31/22   Quincy Simmonds, MD  buPROPion (WELLBUTRIN) 75 MG tablet TOME UNA TABLETA TODOS LOS D AS EN LA MA ANA 06/24/22   [provider]  clonazePAM (KLONOPIN) 0.5 MG tablet TAKE 1 TABLET (0.5 MG TOTAL) BY MOUTH DAILY AS NEEDED FOR ANXIETY. 06/29/22   Lyndle Herrlich, MD  metroNIDAZOLE (FLAGYL) 500 MG tablet Take 1 tablet (500 mg total) by mouth 2 (two) times daily. 04/08/22   Lamptey, Britta Mccreedy, MD  promethazine (PHENERGAN) 12.5 MG tablet Take 1 tablet (12.5 mg total) by mouth every 6 (six) hours as needed for up to 5 days for nausea or vomiting. 05/24/22 05/29/22  Quincy Simmonds, MD  sertraline (ZOLOFT) 100 MG tablet Take 100 mg by mouth 2 (two) times daily. 06/24/22   [provider]  traZODone (DESYREL) 100 MG tablet Take 150 mg by mouth at bedtime as needed. 06/24/22   [provider]      Allergies    Hydromorphone  hcl and Oxycodone-acetaminophen    Review of Systems   Review of Systems Negative except as per HPI Physical Exam Updated Vital Signs BP 129/69   Pulse 64   Temp 98.9 F (37.2 C) (Oral)   Resp 20   Ht 5\' 2"  (1.575 m)   Wt 52.2 kg   LMP 02/08/2006   SpO2 98%   BMI 21.03 kg/m  Physical Exam Vitals and nursing note reviewed.  Constitutional:      General: She is not in acute distress.    Appearance: She is well-developed. She is not diaphoretic.  HENT:     Head: Normocephalic and atraumatic.     Nose:     Right Turbinates: Not enlarged.     Left Turbinates: Not enlarged.     Right Sinus: No maxillary sinus tenderness or frontal sinus tenderness.     Left Sinus: No maxillary sinus tenderness or frontal sinus tenderness.     Comments: No temporal tenderness. Dry blood in left nostril     Mouth/Throat:     Mouth: Mucous membranes are moist.     Pharynx: No oropharyngeal exudate or posterior oropharyngeal erythema.  Eyes:     Conjunctiva/sclera: Conjunctivae normal.  Cardiovascular:     Rate and Rhythm: Normal rate and regular rhythm.     Heart sounds: Normal heart sounds.  Pulmonary:     Effort: Pulmonary effort is normal.  Breath sounds: Normal breath sounds.  Musculoskeletal:     Cervical back: Neck supple.  Lymphadenopathy:     Cervical: No cervical adenopathy.  Skin:    General: Skin is warm and dry.     Findings: No erythema or rash.  Neurological:     Mental Status: She is alert and oriented to person, place, and time.     Cranial Nerves: No facial asymmetry.     Comments: Hoarse, no other deficit identified.   Psychiatric:        Behavior: Behavior normal.     ED Results / Procedures / Treatments   Labs (all labs ordered are listed, but only abnormal results are displayed) Labs Reviewed  CBC  PROTIME-INR  BASIC METABOLIC PANEL  HEPATIC FUNCTION PANEL    EKG EKG Interpretation  Date/Time:  Thursday July 01 2022 11:01:31 EST Ventricular  Rate:  65 PR Interval:  103 QRS Duration: 79 QT Interval:  419 QTC Calculation: 436 R Axis:   81 Text Interpretation: Sinus rhythm Short PR interval Low voltage, precordial leads Confirmed by Edwin Dada ([id number]) on 07/01/2022 11:51:47 AM  Radiology CT Head Wo Contrast  Result Date: 07/01/2022 CLINICAL DATA:  Headaches EXAM: CT HEAD WITHOUT CONTRAST TECHNIQUE: Contiguous axial images were obtained from the base of the skull through the vertex without intravenous contrast. RADIATION DOSE REDUCTION: This exam was performed according to the departmental dose-optimization program which includes automated exposure control, adjustment of the mA and/or kV according to patient size and/or use of iterative reconstruction technique. COMPARISON:  11/05/2020 FINDINGS: Brain: No acute intracranial findings are seen. There are no signs of bleeding within the cranium. Cortical sulci are prominent. Ventricles are not dilated. There is tiny calcification in the right frontal cortex which appears stable. Vascular: Unremarkable. Skull: Unremarkable. Sinuses/Orbits: Unremarkable. Other: None. IMPRESSION: No acute intracranial findings are seen in noncontrast CT brain. Atrophy. Electronically Signed   By: Ernie Avena M.D.   On: 07/01/2022 11:55    Procedures Procedures    Medications Ordered in ED Medications  sodium chloride 0.9 % bolus 1,000 mL (0 mLs Intravenous Stopped 07/01/22 1341)  ondansetron (ZOFRAN) injection 4 mg (4 mg Intravenous Given 07/01/22 1159)  morphine (PF) 2 MG/ML injection 2 mg (2 mg Intravenous Given 07/01/22 1159)  ketorolac (TORADOL) 15 MG/ML injection 15 mg (15 mg Intravenous Given 07/01/22 1337)  diphenhydrAMINE (BENADRYL) capsule 25 mg (25 mg Oral Given 07/01/22 1337)    ED Course/ Medical Decision Making/ A&P                           Medical Decision Making  This patient presents to the ED for concern of nose bleeds, this involves an extensive number of treatment options, and  is a complaint that carries with it a high risk of complications and morbidity.  The differential diagnosis includes but not limited to tension headache, sinusitis, mass   Co morbidities that complicate the patient evaluation  Anxiety, depression, chronic pain syndrome, peptic ulcer disease, insomnia, hyperlipidemia, migraines   Additional history obtained:  Additional history obtained from significant other at bedside who contributes to history as above External records from outside source obtained and reviewed including recent CT chest and soft tissue neck from 06/25/22, concern for granulomatous condition (sarcoid vs granulomatous infection such as TB), difficult to exclude lymphoma. Also found to have scleotic focus wihtin the T2 vertebral body measuring 31mm.  Echo 05/29/21 EF 70-75%   Lab Tests:  I Ordered, and personally interpreted labs.  The pertinent results include:  CBC WNL, CMP WNL, INR WNL   Imaging Studies ordered:  I ordered imaging studies including CT head  I independently visualized and interpreted imaging which showed no acute abnormality  I agree with the radiologist interpretation   Cardiac Monitoring: / EKG:  The patient was maintained on a cardiac monitor.  I personally viewed and interpreted the cardiac monitored which showed an underlying rhythm of: Sinus rhythm, rate 65   Consultations Obtained:  I requested consultation with the Dr. Corliss Blacker,  and discussed lab and imaging findings as well as pertinent plan - they recommend: Pulmonology office to call patient to schedule appointment.   Problem List / ED Course / Critical interventions / Medication management  57 year old female presents with acute concern for nosebleed x 3 episodes lasting 2 minutes or less over the past 3 days.  She is found of dried blood in the left nostril without active bleeding, she is normotensive.  States that has been having frontal headaches for several months without focal  neurofindings, exam reassuring.  Ongoing chest discomfort which has been evaluated multiple times without source identified.  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  Abnormal findings discussed with patient, consult to pulmonology for further guidance who recommends follow-up in clinic, clinic to call patient and schedule his appointment.  Patient is advised to contact her ENT, follow-up as planned for swallow eval given her reports of choking at times with swallowing limiting her p.o. intake currently.  Return to ER for new or worsening symptoms. I ordered medication including morphine, Benadryl, Toradol, IV fluids, Zofran for headache Reevaluation of the patient after these medicines showed that the patient stayed the same I have reviewed the patients home medicines and have made adjustments as needed   Social Determinants of Health:  Lives with spouse, speaks Spanish  Test / Admission - Considered:  Discharged with plan to follow-up with pulmonology outpatient.  Advised to contact her ENT for follow-up related to prior imaging         Final Clinical Impression(s) / ED Diagnoses Final diagnoses:  Nonintractable episodic headache, unspecified headache type  Epistaxis  Abnormal CT of the chest    Rx / DC Orders ED Discharge Orders     None         Jeannie Fend, PA-C 07/01/22 1530    Franne Forts, DO 07/06/22 1601

## 2022-07-01 NOTE — Telephone Encounter (Signed)
RTC to patient spoke with boyfriend.  Patient has had 3 days of nose bleeding.  Not to bad today.  Has a bad headache is alert and talking.  Continues to have chest Pain from time to time as in the past. No available appointments today or tomorrow.   Encouraged to take patient to the ER or go to An [facility]gent care for assessment.  Boyfriend will take patient to [facility] ER location as soon as possible.

## 2022-07-01 NOTE — Telephone Encounter (Signed)
Routing to front desk pool as well for help with trying to get pt scheduled for an appt. This appt needs to be as soon as possible. Best providers would be Dr. Lamonte Sakai, Dr. Valeta Harms, or Dr. Verlee Monte. If none of them have any 49min openings, we can use blocked slots on either Dr. Agustina Caroli or Dr. Juline Patch schedule to get pt scheduled for an appt.

## 2022-07-02 NOTE — Telephone Encounter (Signed)
Left voicemail on spouses phone to call back. Had interpreter leave voicemail on patient's voicemail to call back and sent a mychart message to patient.

## 2022-07-02 NOTE — Telephone Encounter (Signed)
Pt called office as well as her spouse. Appt scheduled for pt with Dr. Tonia Brooms 1/17. Nothing further needed.

## 2022-07-05 ENCOUNTER — Institutional Professional Consult (permissible substitution): Payer: Commercial Managed Care - HMO | Admitting: Pulmonary Disease

## 2022-07-07 ENCOUNTER — Ambulatory Visit (HOSPITAL_COMMUNITY): Admission: RE | Admit: 2022-07-07 | Payer: Commercial Managed Care - HMO | Source: Ambulatory Visit

## 2022-07-07 ENCOUNTER — Encounter (HOSPITAL_COMMUNITY): Payer: Self-pay

## 2022-07-07 ENCOUNTER — Ambulatory Visit: Payer: Commercial Managed Care - HMO | Admitting: Pulmonary Disease

## 2022-07-07 ENCOUNTER — Encounter: Payer: Self-pay | Admitting: Pulmonary Disease

## 2022-07-07 VITALS — BP 120/70 | HR 69 | Ht 62.0 in | Wt 115.2 lb

## 2022-07-07 DIAGNOSIS — R918 Other nonspecific abnormal finding of lung field: Secondary | ICD-10-CM

## 2022-07-07 DIAGNOSIS — J3800 Paralysis of vocal cords and larynx, unspecified: Secondary | ICD-10-CM | POA: Diagnosis not present

## 2022-07-07 DIAGNOSIS — R599 Enlarged lymph nodes, unspecified: Secondary | ICD-10-CM

## 2022-07-07 LAB — C-REACTIVE PROTEIN: CRP: <1 mg/dL (ref 0.5–20.0)

## 2022-07-07 LAB — SEDIMENTATION RATE: Sed Rate: 9 mm/hr (ref 0–30)

## 2022-07-07 MED ORDER — KETOROLAC TROMETHAMINE 10 MG PO TABS: 10.0000 mg | ORAL_TABLET | Freq: Three times a day (TID) | ORAL | 0 refills | Status: DC | PRN
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
## 2022-07-07 NOTE — Patient Instructions (Addendum)
Thank you for visiting Dr. Tonia Brooms at [facility]. Today we recommend the following:  Orders Placed This Encounter  Procedures   NM PET Image Initial (PI) Skull Base To Thigh (F-18 FDG)   QuantiFERON-TB Gold Plus   Lactate Dehydrogenase (LDH)   Sed Rate (ESR)   C-reactive protein   Angiotensin converting enzyme   Meds ordered this encounter  Medications   ketorolac (TORADOL) 10 MG tablet    Sig: Take 1 tablet (10 mg total) by mouth every 8 (eight) hours as needed.    Dispense:  30 tablet    Refill:  0   Return in about 3 weeks (around 07/28/2022) for with APP or Dr. Tonia Brooms.    Please do your part to reduce the spread of COVID-19.

## 2022-07-09 ENCOUNTER — Encounter: Payer: Self-pay | Admitting: Speech Pathology

## 2022-07-09 ENCOUNTER — Ambulatory Visit (INDEPENDENT_AMBULATORY_CARE_PROVIDER_SITE_OTHER): Payer: Commercial Managed Care - HMO

## 2022-07-09 ENCOUNTER — Other Ambulatory Visit: Payer: Self-pay

## 2022-07-09 ENCOUNTER — Encounter: Payer: Self-pay | Admitting: Internal Medicine

## 2022-07-09 VITALS — BP 132/57 | HR 70 | Temp 97.7°F | Ht 62.0 in | Wt 115.3 lb

## 2022-07-09 DIAGNOSIS — M791 Myalgia, unspecified site: Secondary | ICD-10-CM

## 2022-07-09 DIAGNOSIS — R590 Localized enlarged lymph nodes: Secondary | ICD-10-CM | POA: Diagnosis not present

## 2022-07-09 DIAGNOSIS — Z0000 Encounter for general adult medical examination without abnormal findings: Secondary | ICD-10-CM

## 2022-07-09 DIAGNOSIS — F419 Anxiety disorder, unspecified: Secondary | ICD-10-CM | POA: Diagnosis not present

## 2022-07-09 DIAGNOSIS — M7918 Myalgia, other site: Secondary | ICD-10-CM | POA: Insufficient documentation

## 2022-07-09 DIAGNOSIS — M5416 Radiculopathy, lumbar region: Secondary | ICD-10-CM

## 2022-07-09 DIAGNOSIS — M25551 Pain in right hip: Secondary | ICD-10-CM

## 2022-07-09 DIAGNOSIS — E785 Hyperlipidemia, unspecified: Secondary | ICD-10-CM

## 2022-07-09 LAB — QUANTIFERON-TB GOLD PLUS
Mitogen-NIL: >10 IU/mL
NIL: 0.04 IU/mL
QuantiFERON-TB Gold Plus: POSITIVE — AB
TB1-NIL: 0.54 IU/mL
TB2-NIL: 0.32 IU/mL

## 2022-07-09 LAB — ANGIOTENSIN CONVERTING ENZYME: Angiotensin-Converting Enzyme: 17 U/L (ref 9–67)

## 2022-07-09 LAB — LACTATE DEHYDROGENASE: LDH: 126 U/L (ref 120–250)

## 2022-07-09 MED ORDER — CLONAZEPAM 0.5 MG PO TABS: ORAL_TABLET | ORAL | 1 refills | Status: DC

## 2022-07-09 MED ORDER — TRAMADOL HCL ER 100 MG PO TB24: 100.0000 mg | ORAL_TABLET | Freq: Every day | ORAL | 0 refills | Status: DC

## 2022-07-09 NOTE — Assessment & Plan Note (Signed)
Patient reports significant anxiety at home given her uncertain diagnosis with respect to her lung nodules. Reports having difficulty filling her clonazepam over the last 2 months.  Plan: -Refilled clonazepam 0.5 mg today
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
## 2022-07-09 NOTE — Assessment & Plan Note (Signed)
Repeat screening mammogram ordered today.

## 2022-07-09 NOTE — Patient Instructions (Addendum)
Thank you for coming to see [place] in clinic [name].   Plan: - Please start taking tramadol 100 mg daily for your pain  - We got your blood today to check a lab that looks at muscle inflammation (we will call you with these results)  - We referred you to the bone doctors (orthopedics) to be reassessed for hip injections (you will be called to schedule an appointment)  - We refilled your clonazepam for anxiety  - We referred you to have a mammogram done (you will be called to schedule this appointment)   It was very nice to see you, thank you for allowing [place] to be involved in your care.     -------------------------------------------------------------------------------------------------  Karl Pock por venir a vernos a la clnica [name].  Plan: - Empiece a tomar tramadol 100 mg al da para el dolor.  - Hoy tomamos su sangre para revisar un laboratorio que Environmental manager (lo llamaremos con estos resultados)  - Lo remitimos a los mdicos seos (ortopedia) para que lo reevalen para las inyecciones en la cadera (lo llamarn para programar una cita)  - Recargamos tu clonazepam para la ansiedad  - La derivamos para que se haga una mamografa (se le llamar para programar esta cita)   Fue muy agradable verte, gracias por permitirnos involucrarnos en tu cuidado.

## 2022-07-09 NOTE — Assessment & Plan Note (Signed)
Patient has chronic lower back/right hip pain. Patient reports that she last received an injection in her right hip approximately 1 year ago ortho. She has been lost to follow up with ortho. Patient is requesting referral for repeat injection. Patient has tried tylenol and ibuprofen at home without relief.   Plan: - Ortho referral ordered

## 2022-07-09 NOTE — Assessment & Plan Note (Signed)
Patient was seen by [facility] pulmonology on 1/17 after being referred for abnormal CT imaging on 1/5 that demonstrated heterogenous enhancing nodular soft tissue density within the mediastinum and hilum bilaterally w/ areas of central necrosis within the nodes. Patient had reported losing her voice, having intermittent central chest pain, 5 lb weight loss, and night seats. Pulmonology suspects involvement of the recurrent laryngeal nerve is the cause of her lost voice. Pulmonology ordered qauntiferon, LDH, ESR, CRP, and ACE level. Pulmonology considering lymphoproliferative disorder vs sarcoidosis and also plans for nuclear medicine PET scan. From there, they will decide on bronchoscopy w/ tissue sampling (if quantiferon positive) vs surgery for lymph node removal (best for lymphoproliferative disorder). Bronchoscopy would have to be done w/ airborne precautions as she may have reactivation TB within the lymph nodes. Plan was to f/u w/ pulmonology in clinic in 3 weeks. Patient was prescribed toradol for the chest pain. LDH, CRP, ESR, and ACE WNL. Pending quantiferon. Patient continues to hoarse voice, cough, weight loss, night sweats, diffuse muscle aches, and chest pain over the last 3-4 months. She reports that the chest pain is constant and radiates to her back. Patient denies any symptoms of PUD over the last few years. Reports no improvement with tylenol, ibuprofen, or toradol. She has also tried tramadol at 50 mg previously without relief.   Plan: - Tramadol 100 mg daily - CK ordered today - F/u with pulmonology at next scheduled visit

## 2022-07-09 NOTE — Assessment & Plan Note (Signed)
Patient is not currently on any medications for this condition. Lipid panel from 10 months ago demonstrates total cholesterol 260, TG 274, and LDL 160. ASCVD risk is 3.4% over the next 10 years.   Plan: - Repeat lipid panel at next visit to reassess need for statin

## 2022-07-10 LAB — CK: Total CK: 65 U/L (ref 32–182)

## 2022-07-15 ENCOUNTER — Ambulatory Visit
Admission: RE | Admit: 2022-07-15 | Discharge: 2022-07-15 | Disposition: A | Discharge status: Home / Self Care | Payer: Commercial Managed Care - HMO | Source: Ambulatory Visit | Attending: Internal Medicine | Admitting: Internal Medicine

## 2022-07-15 DIAGNOSIS — Z0000 Encounter for general adult medical examination without abnormal findings: Secondary | ICD-10-CM

## 2022-07-16 ENCOUNTER — Encounter (HOSPITAL_COMMUNITY): Payer: Self-pay

## 2022-07-16 ENCOUNTER — Ambulatory Visit (HOSPITAL_COMMUNITY)
Admission: RE | Admit: 2022-07-16 | Discharge: 2022-07-16 | Disposition: A | Discharge status: Home / Self Care | Payer: Commercial Managed Care - HMO | Source: Ambulatory Visit | Attending: Otolaryngology | Admitting: Otolaryngology

## 2022-07-16 ENCOUNTER — Ambulatory Visit (HOSPITAL_COMMUNITY)
Admission: RE | Admit: 2022-07-16 | Discharge: 2022-07-16 | Disposition: A | Discharge status: Home / Self Care | Payer: Commercial Managed Care - HMO | Source: Ambulatory Visit

## 2022-07-16 ENCOUNTER — Telehealth: Payer: Self-pay

## 2022-07-16 DIAGNOSIS — R634 Abnormal weight loss: Secondary | ICD-10-CM | POA: Diagnosis not present

## 2022-07-16 DIAGNOSIS — R1312 Dysphagia, oropharyngeal phase: Secondary | ICD-10-CM | POA: Diagnosis not present

## 2022-07-16 DIAGNOSIS — R059 Cough, unspecified: Secondary | ICD-10-CM | POA: Insufficient documentation

## 2022-07-16 DIAGNOSIS — R490 Dysphonia: Secondary | ICD-10-CM | POA: Diagnosis present

## 2022-07-16 DIAGNOSIS — R1310 Dysphagia, unspecified: Secondary | ICD-10-CM

## 2022-07-16 NOTE — Progress Notes (Signed)
Modified Barium Swallow Progress Note  Patient Details  Name: Karen [name] MRN: [id number] Date of Birth: [birth year]  Today's Date: 07/16/2022  Modified Barium Swallow completed.  Full report located under Chart Review in the Imaging Section.  Brief recommendations include the following:  Clinical Impression  Pt demonstrates a moderate dysphagia with low risk of aspiration. Pt has been diagnosed with vocal fold paralysis and dysphonia and reports dysphagia as an additional symptom. During MBS pt observed to have consistent airway protection during the swallow though pt coughing and gagging often. Pt able to manage single sips of thin liquids; all liquid textures arrive at the pyriforms, pt initiates the swallow and there is no residue or apparent weakness. However, when pt is directed to take consecutive swallows she inevitably gags and coughs on a third to fifth consecutive swallow attempt though no penetration or aspiraiton occured. 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 This leads to pooling of liquid in the pharynx during consecutive swallows and trace retrograde movement of liquid to the nasopharynx during attempts. Pt is not aspirating, though she reports that when her problem first started and she did not understand the need to take small sips she had some overt coughing with liquids, likely with airway invasion with sensation and ejection and subsequent pt natural compensation of small single sips. This continues to be the most appropriate compensation. Head turns/tilts and tucks as well as thickened liquids were all trialed during this test with no sensation of improvement from pt, largely because VF paralysis is not the cause of this dysphagia. Rapidity and timing of swallowing initiation are causing pts difficulty. Injection laryngoplasty unlikely to  improve dysphagia though voice may improve. Pt could benefit from f/u with SLP to address voice therapy and compensatory strategies and problem solving best liquids that given the best nutritional return.   Swallow Evaluation Recommendations       SLP Diet Recommendations: Regular solids;Thin liquid   Liquid Administration via: Cup;Straw (single sips)   Medication Administration: Whole meds with puree   Supervision: Patient able to self feed   Compensations: Slow rate;Small sips/bites (single sips)   Postural Changes: Remain semi-upright after after feeds/meals (Comment);Seated upright at 90 degrees   Oral Care Recommendations: Oral care BID        Audel Coakley, Katherene Ponto 07/16/2022,1:05 PM

## 2022-07-16 NOTE — Telephone Encounter (Signed)
A Prior Authorization was initiated for this patients TRAMADOL 100MG  through CoverMyMeds.   Key: [id number]

## 2022-07-19 ENCOUNTER — Encounter (HOSPITAL_COMMUNITY)
Admission: RE | Admit: 2022-07-19 | Discharge: 2022-07-19 | Disposition: A | Discharge status: Home / Self Care | Payer: Commercial Managed Care - HMO | Source: Ambulatory Visit | Attending: Pulmonary Disease | Admitting: Pulmonary Disease

## 2022-07-19 DIAGNOSIS — R599 Enlarged lymph nodes, unspecified: Secondary | ICD-10-CM | POA: Insufficient documentation

## 2022-07-19 DIAGNOSIS — R918 Other nonspecific abnormal finding of lung field: Secondary | ICD-10-CM | POA: Insufficient documentation

## 2022-07-19 NOTE — Telephone Encounter (Signed)
Prior Auth for patients medication TRAMADOL approved by [employer] from 07/16/22 to 07/17/23.  CoverMyMeds Key: [facility] Case ID #: [id number]

## 2022-07-20 NOTE — Progress Notes (Signed)
Internal Medicine Clinic Attending  Case discussed with Dr. Alton Revere  At the time of the visit.  We reviewed the resident's history and exam and pertinent patient test results.  I agree with the assessment, diagnosis, and plan of care documented in the resident's note. Primary concern is possibility of reactivated TB.  I messaged Dr. Valeta Harms regarding plan, which was to obtain PET prior to bronch.  Pulmonary has looped ID into the conversation.  Pulmonary is taking the lead on further evaluation of her nodules.  Other unrelated referrals and tests should be postponed until TB has been ruled out.

## 2022-07-21 ENCOUNTER — Ambulatory Visit: Payer: Commercial Managed Care - HMO | Admitting: Speech Pathology

## 2022-07-23 ENCOUNTER — Ambulatory Visit: Payer: Commercial Managed Care - HMO | Admitting: Sports Medicine

## 2022-07-28 ENCOUNTER — Ambulatory Visit (INDEPENDENT_AMBULATORY_CARE_PROVIDER_SITE_OTHER): Payer: Commercial Managed Care - HMO | Admitting: Nurse Practitioner

## 2022-07-28 ENCOUNTER — Encounter: Payer: Self-pay | Admitting: Nurse Practitioner

## 2022-07-28 ENCOUNTER — Encounter: Payer: Self-pay | Admitting: Pulmonary Disease

## 2022-07-28 ENCOUNTER — Telehealth: Payer: Self-pay | Admitting: Nurse Practitioner

## 2022-07-28 VITALS — BP 118/62 | HR 88 | Temp 97.3°F | Ht 62.0 in | Wt 108.0 lb

## 2022-07-28 DIAGNOSIS — M546 Pain in thoracic spine: Secondary | ICD-10-CM | POA: Diagnosis not present

## 2022-07-28 DIAGNOSIS — R599 Enlarged lymph nodes, unspecified: Secondary | ICD-10-CM | POA: Diagnosis not present

## 2022-07-28 DIAGNOSIS — R590 Localized enlarged lymph nodes: Secondary | ICD-10-CM | POA: Diagnosis not present

## 2022-07-28 DIAGNOSIS — R918 Other nonspecific abnormal finding of lung field: Secondary | ICD-10-CM | POA: Diagnosis not present

## 2022-07-28 DIAGNOSIS — R52 Pain, unspecified: Secondary | ICD-10-CM

## 2022-07-28 MED ORDER — HYDROCODONE-ACETAMINOPHEN 5-325 MG PO TABS: 1.0000 | ORAL_TABLET | Freq: Four times a day (QID) | ORAL | 0 refills | Status: DC | PRN

## 2022-07-28 NOTE — Patient Instructions (Addendum)
Continue Albuterol inhaler 2 puffs every 6 hours as needed for shortness of breath or wheezing. Notify if symptoms persist despite rescue inhaler/neb use Continue ketorolac 1 tab every 8 hours as needed for mild to moderate pain Continue promethazine 1 tab every 6 hours as needed for nausea/vomiting  Hydrocodone-acetaminophen 5-325 mg tablets. Take 1 tablet every 6 hours as needed for moderate to severe pain. If this does not control your pain, you can take 2 tablets with subsequent doses. Do not drive after taking. If this causes itching, please stop and notify us. Do not take tylenol/acetaminophen while taking this   Someone will contact you to get bronchoscopy scheduled ASAP  Attend PET scan tomorrow  Plan to follow up with Dr. Valeta Harms after bronchoscopy to discuss results. If symptoms do not improve or worsen, please contact office for sooner follow up or seek emergency care.
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
## 2022-07-28 NOTE — Telephone Encounter (Addendum)
Pt has been scheduled for 2/14 at 2:00 by [facility].  Pt to arrive at 11:00 to get covid test that morning prior to procedure due to needing interpreter and TB precautions.  I spoke to pt and gave her appt info thru interpreter.  Letter sent thru MyChart per pt's request.

## 2022-07-29 ENCOUNTER — Inpatient Hospital Stay (HOSPITAL_COMMUNITY)
Admission: RE | Admit: 2022-07-29 | Discharge: 2022-07-29 | Disposition: A | Discharge status: Home / Self Care | Payer: Commercial Managed Care - HMO | Source: Ambulatory Visit | Attending: Pulmonary Disease | Admitting: Pulmonary Disease

## 2022-07-29 DIAGNOSIS — R599 Enlarged lymph nodes, unspecified: Secondary | ICD-10-CM | POA: Insufficient documentation

## 2022-07-29 DIAGNOSIS — R918 Other nonspecific abnormal finding of lung field: Secondary | ICD-10-CM | POA: Insufficient documentation

## 2022-07-29 LAB — GLUCOSE, CAPILLARY: Glucose-Capillary: 102 mg/dL — ABNORMAL HIGH (ref 70–99)

## 2022-07-29 MED ORDER — FLUDEOXYGLUCOSE F - 18 (FDG) INJECTION
5.3800 | Freq: Once | INTRAVENOUS | Status: AC | PRN
  Administered 2022-07-29: 5.38 via INTRAVENOUS

## 2022-07-30 ENCOUNTER — Other Ambulatory Visit: Payer: Self-pay

## 2022-07-30 ENCOUNTER — Encounter: Payer: Self-pay | Admitting: Nurse Practitioner

## 2022-07-30 ENCOUNTER — Encounter (HOSPITAL_COMMUNITY): Payer: Self-pay

## 2022-07-30 ENCOUNTER — Ambulatory Visit (INDEPENDENT_AMBULATORY_CARE_PROVIDER_SITE_OTHER): Payer: Commercial Managed Care - HMO

## 2022-07-30 ENCOUNTER — Inpatient Hospital Stay (HOSPITAL_COMMUNITY)
Admit: 2022-07-30 | Discharge: 2022-08-04 | DRG: 181 | Disposition: A | Discharge status: Home / Self Care | Payer: Commercial Managed Care - HMO | Attending: Internal Medicine | Admitting: Internal Medicine

## 2022-07-30 VITALS — BP 111/86 | HR 95 | Temp 98.2°F | Ht 62.0 in

## 2022-07-30 DIAGNOSIS — J90 Pleural effusion, not elsewhere classified: Secondary | ICD-10-CM | POA: Diagnosis present

## 2022-07-30 DIAGNOSIS — Z8616 Personal history of COVID-19: Secondary | ICD-10-CM

## 2022-07-30 DIAGNOSIS — R112 Nausea with vomiting, unspecified: Secondary | ICD-10-CM | POA: Diagnosis present

## 2022-07-30 DIAGNOSIS — F419 Anxiety disorder, unspecified: Secondary | ICD-10-CM | POA: Diagnosis present

## 2022-07-30 DIAGNOSIS — E876 Hypokalemia: Secondary | ICD-10-CM | POA: Diagnosis present

## 2022-07-30 DIAGNOSIS — Z885 Allergy status to narcotic agent status: Secondary | ICD-10-CM

## 2022-07-30 DIAGNOSIS — I10 Essential (primary) hypertension: Secondary | ICD-10-CM | POA: Diagnosis present

## 2022-07-30 DIAGNOSIS — R590 Localized enlarged lymph nodes: Secondary | ICD-10-CM

## 2022-07-30 DIAGNOSIS — F32A Depression, unspecified: Secondary | ICD-10-CM | POA: Diagnosis present

## 2022-07-30 DIAGNOSIS — J3800 Paralysis of vocal cords and larynx, unspecified: Secondary | ICD-10-CM

## 2022-07-30 DIAGNOSIS — R64 Cachexia: Secondary | ICD-10-CM | POA: Diagnosis present

## 2022-07-30 DIAGNOSIS — R1110 Vomiting, unspecified: Secondary | ICD-10-CM | POA: Insufficient documentation

## 2022-07-30 DIAGNOSIS — Z79899 Other long term (current) drug therapy: Secondary | ICD-10-CM

## 2022-07-30 DIAGNOSIS — G8929 Other chronic pain: Secondary | ICD-10-CM | POA: Diagnosis present

## 2022-07-30 DIAGNOSIS — C3401 Malignant neoplasm of right main bronchus: Principal | ICD-10-CM | POA: Diagnosis present

## 2022-07-30 DIAGNOSIS — Z8711 Personal history of peptic ulcer disease: Secondary | ICD-10-CM

## 2022-07-30 DIAGNOSIS — B351 Tinea unguium: Secondary | ICD-10-CM | POA: Diagnosis present

## 2022-07-30 DIAGNOSIS — R109 Unspecified abdominal pain: Secondary | ICD-10-CM | POA: Diagnosis present

## 2022-07-30 DIAGNOSIS — E781 Pure hyperglyceridemia: Secondary | ICD-10-CM | POA: Diagnosis present

## 2022-07-30 DIAGNOSIS — Z227 Latent tuberculosis: Secondary | ICD-10-CM | POA: Diagnosis present

## 2022-07-30 DIAGNOSIS — Z603 Acculturation difficulty: Secondary | ICD-10-CM | POA: Diagnosis present

## 2022-07-30 DIAGNOSIS — E860 Dehydration: Secondary | ICD-10-CM | POA: Diagnosis present

## 2022-07-30 DIAGNOSIS — G43909 Migraine, unspecified, not intractable, without status migrainosus: Secondary | ICD-10-CM | POA: Diagnosis present

## 2022-07-30 DIAGNOSIS — R262 Difficulty in walking, not elsewhere classified: Secondary | ICD-10-CM | POA: Diagnosis present

## 2022-07-30 DIAGNOSIS — Z1152 Encounter for screening for COVID-19: Secondary | ICD-10-CM

## 2022-07-30 DIAGNOSIS — Z8249 Family history of ischemic heart disease and other diseases of the circulatory system: Secondary | ICD-10-CM

## 2022-07-30 DIAGNOSIS — R519 Headache, unspecified: Secondary | ICD-10-CM | POA: Diagnosis present

## 2022-07-30 DIAGNOSIS — M549 Dorsalgia, unspecified: Secondary | ICD-10-CM | POA: Diagnosis present

## 2022-07-30 DIAGNOSIS — R079 Chest pain, unspecified: Secondary | ICD-10-CM | POA: Diagnosis present

## 2022-07-30 DIAGNOSIS — Z8615 Personal history of latent tuberculosis infection: Secondary | ICD-10-CM

## 2022-07-30 DIAGNOSIS — R609 Edema, unspecified: Secondary | ICD-10-CM | POA: Diagnosis present

## 2022-07-30 DIAGNOSIS — G43019 Migraine without aura, intractable, without status migrainosus: Principal | ICD-10-CM

## 2022-07-30 DIAGNOSIS — K589 Irritable bowel syndrome without diarrhea: Secondary | ICD-10-CM | POA: Diagnosis present

## 2022-07-30 DIAGNOSIS — C7951 Secondary malignant neoplasm of bone: Secondary | ICD-10-CM | POA: Diagnosis present

## 2022-07-30 DIAGNOSIS — Z90710 Acquired absence of both cervix and uterus: Secondary | ICD-10-CM

## 2022-07-30 DIAGNOSIS — G4700 Insomnia, unspecified: Secondary | ICD-10-CM | POA: Diagnosis present

## 2022-07-30 DIAGNOSIS — Z681 Body mass index (BMI) 19 or less, adult: Secondary | ICD-10-CM

## 2022-07-30 DIAGNOSIS — C801 Malignant (primary) neoplasm, unspecified: Secondary | ICD-10-CM | POA: Diagnosis present

## 2022-07-30 LAB — CBC
HCT: 37.5 % (ref 36.0–46.0)
Hemoglobin: 13.3 g/dL (ref 12.0–15.0)
MCH: 31.9 pg (ref 26.0–34.0)
MCHC: 35.5 g/dL (ref 30.0–36.0)
MCV: 89.9 fL (ref 80.0–100.0)
Platelets: 386 10*3/uL (ref 150–400)
RBC: 4.17 MIL/uL (ref 3.87–5.11)
RDW: 11.9 % (ref 11.5–15.5)
WBC: 11.6 10*3/uL — ABNORMAL HIGH (ref 4.0–10.5)
nRBC: 0 % (ref 0.0–0.2)

## 2022-07-30 LAB — COMPREHENSIVE METABOLIC PANEL
ALT: 12 U/L (ref 0–44)
AST: 23 U/L (ref 15–41)
Albumin: 4.2 g/dL (ref 3.5–5.0)
Alkaline Phosphatase: 104 U/L (ref 38–126)
Anion gap: 13 (ref 5–15)
BUN: 19 mg/dL (ref 6–20)
CO2: 22 mmol/L (ref 22–32)
Calcium: 9.5 mg/dL (ref 8.9–10.3)
Chloride: 100 mmol/L (ref 98–111)
Creatinine, Ser: 0.8 mg/dL (ref 0.44–1.00)
GFR, Estimated: >60 mL/min (ref >60)
Glucose, Bld: 124 mg/dL — ABNORMAL HIGH (ref 70–99)
Potassium: 3.3 mmol/L — ABNORMAL LOW (ref 3.5–5.1)
Sodium: 135 mmol/L (ref 135–145)
Total Bilirubin: 0.6 mg/dL (ref 0.3–1.2)
Total Protein: 7.3 g/dL (ref 6.5–8.1)

## 2022-07-30 MED ORDER — CLONAZEPAM 0.5 MG PO TABS: 0.5000 mg | ORAL_TABLET | Freq: Two times a day (BID) | ORAL | Status: DC | PRN

## 2022-07-30 MED ORDER — LACTATED RINGERS IV SOLN: INTRAVENOUS | Status: AC

## 2022-07-30 MED ORDER — SENNOSIDES-DOCUSATE SODIUM 8.6-50 MG PO TABS
1.0000 | ORAL_TABLET | Freq: Every day | ORAL | Status: DC
  Administered 2022-08-01 – 2022-08-03 (×4): 1 via ORAL
  Filled 2022-07-30 (×5): qty 1

## 2022-07-30 MED ORDER — ONDANSETRON HCL 4 MG/2ML IJ SOLN
4.0000 mg | Freq: Four times a day (QID) | INTRAMUSCULAR | Status: DC | PRN
  Administered 2022-07-31 – 2022-08-02 (×2): 4 mg via INTRAVENOUS
  Filled 2022-07-30 (×2): qty 2

## 2022-07-30 MED ORDER — POLYETHYLENE GLYCOL 3350 17 G PO PACK
17.0000 g | PACK | Freq: Every day | ORAL | Status: DC
  Administered 2022-07-31: 17 g via ORAL
  Filled 2022-07-30 (×4): qty 1

## 2022-07-30 MED ORDER — POTASSIUM CHLORIDE 20 MEQ PO PACK
20.0000 meq | PACK | Freq: Once | ORAL | Status: AC
  Administered 2022-07-30: 20 meq via ORAL
  Filled 2022-07-30: qty 1

## 2022-07-30 MED ORDER — SERTRALINE HCL 100 MG PO TABS
100.0000 mg | ORAL_TABLET | Freq: Two times a day (BID) | ORAL | Status: DC
  Administered 2022-07-30 – 2022-08-04 (×10): 100 mg via ORAL
  Filled 2022-07-30 (×10): qty 1

## 2022-07-30 MED ORDER — TRAZODONE HCL 50 MG PO TABS
150.0000 mg | ORAL_TABLET | Freq: Every evening | ORAL | Status: DC | PRN
  Administered 2022-07-30 – 2022-08-03 (×5): 150 mg via ORAL
  Filled 2022-07-30 (×5): qty 3

## 2022-07-30 MED ORDER — ONDANSETRON HCL 4 MG/2ML IJ SOLN
4.0000 mg | Freq: Once | INTRAMUSCULAR | Status: AC
  Administered 2022-07-30: 4 mg via INTRAVENOUS
  Filled 2022-07-30: qty 2

## 2022-07-30 MED ORDER — ONDANSETRON 8 MG PO TBDP: 8.0000 mg | ORAL_TABLET | Freq: Three times a day (TID) | ORAL | 0 refills | Status: DC | PRN

## 2022-07-30 MED ORDER — HYDROCODONE-ACETAMINOPHEN 5-325 MG PO TABS
1.0000 | ORAL_TABLET | Freq: Two times a day (BID) | ORAL | Status: DC | PRN
  Administered 2022-07-30: 2 via ORAL
  Administered 2022-07-31 (×2): 1 via ORAL
  Filled 2022-07-30 (×2): qty 1
  Filled 2022-07-30: qty 2

## 2022-07-30 MED ORDER — LACTATED RINGERS IV BOLUS
1000.0000 mL | Freq: Once | INTRAVENOUS | Status: AC
  Administered 2022-07-30: 1000 mL via INTRAVENOUS

## 2022-07-30 MED ORDER — ENOXAPARIN SODIUM 40 MG/0.4ML IJ SOSY
40.0000 mg | PREFILLED_SYRINGE | INTRAMUSCULAR | Status: DC
  Administered 2022-07-30 – 2022-07-31 (×2): 40 mg via SUBCUTANEOUS
  Filled 2022-07-30 (×2): qty 0.4

## 2022-07-30 NOTE — Patient Instructions (Addendum)
Thank you for coming to see [place] in clinic [name].   Plan: - We are in the process of directly admitting you to the hospital. Unfortunately, there are not any available beds yet. When a bed is available, you will be called to come into the hospital (it is uncertain how long this may take but we hope over the next day or two).   - Please stop taking phenergan 12.5 mg for nausea  - Please start taking zofran 8mg  for your nausea    It was very nice to see you, thank you for allowing [place] to be involved in your care.   Please arrive to your next appointment 5-10 minutes before your scheduled appointment time, thank you!

## 2022-07-30 NOTE — Assessment & Plan Note (Signed)
Patient was initially seen in clinic for mediastinal lymphadenopathy on 1/19. Patient had seen by [facility] pulmonology on 1/17 after being referred for abnormal CT imaging on 1/5 that demonstrated heterogenous enhancing nodular soft tissue density within the mediastinum and hilum bilaterally w/ areas of central necrosis within the nodes. Patient had reported losing her voice, having intermittent central chest pain, 5 lb weight loss, and night seats. Pulmonology suspected involvement of the recurrent laryngeal nerve as the cause of her lost voice. Pulmonology ordered qauntiferon, LDH, ESR, CRP, and ACE level. Pulmonology considering lymphoproliferative disorder vs sarcoidosis and also planned for nuclear medicine PET scan. From there, plan was to decide on bronchoscopy w/ tissue sampling (if quantiferon positive) vs surgery for lymph node removal (best for lymphoproliferative disorder). Bronchoscopy would have to be done w/ airborne precautions as she may have reactivation TB within the lymph nodes. Plan was to f/u w/ pulmonology in clinic in 3 weeks. Patient was prescribed toradol for the chest pain. LDH, CRP, ESR, and ACE were WNL. Quantiferon was positive 3 weeks ago.   Patient was seen in clinic on 1/19 complaining of continued chest pain that radiates to her back with no improvement with tylenol, ibuprofen, or toradol. Plan was to start tramadol 100 mg daily for pain w/ CK ordered. CK was WNL. Tramadol did not improve her pain. She was seen again by pulmonology on 2/7 where she was prescribed Hydrocodone-acetaminophen 5-325 mg for her pain. She had her PET scan done yesterday which demonstrates hypermetabolic thoracic/low cervical adenopathy and pulmonary nodularity favoring small-cell lung cancer vs lymphoma, hypermetabolic osseous lesions most consistent with metastatic disease, and new right pleural effusion. She has bronchoscopy scheduled for 2/14.   Today, patient continues to hoarse voice, cough, n/v,  weight loss, night sweats, diffuse muscle aches, and chest pain over the last 3-4 months. Patient states that her pain has improved somewhat after starting Hydrocodone-acetaminophen 5-325 mg. Patient reports that she is not able to eat or drink much at home due to decreased appetite and nausea, is feeling very fatigued, and is having difficulty walking due to generalized weakness and fatigue. Patient has tried zofran 4mg  and and phenergan 12.5 mg q6 hours PRN for her nausea without relief. Patient has lost 7 lbs since 1/19. She has decreased skin turgor on exam. I believe the patient needs to be hospitalized for failure to thrive and inability to tolerate PO intake. Patient will require negative pressure room given concern for possible TB. Currently, no inpatient negative pressure beds are available. However, bed control has been notified by nursing staff to call the patient to be admitted once a bed becomes available.     Plan: - Stop tramadol 100 mg daily - Continue Hydrocodone-acetaminophen 5-325 mg - Stop phenergan - Start zofran 8 mg

## 2022-07-30 NOTE — Hospital Course (Addendum)
VS:  Lost weight 5 minths    Home medications: Trazodine Bupro Clona Norco Toradik Flagyl Promethazine Sertraline  CT imaging 06/25/22 showed heterogenous enhancing nodular soft tissue dentsity within the mediastinum and hilum bilaterally with areas of central necrosis within nodes and sclerotic focus on T2 vertebra  PET IMPRESSION: 1. Hypermetabolic thoracic/low cervical adenopathy, and pulmonary nodularity, favoring small-cell lung cancer versus lymphoma. 2. Hypermetabolic osseous lesions, most consistent with metastatic disease. 3. Although the thoracic findings could be seen with sarcoidosis, the hypermetabolic osseous lesions, including a new dominant right ischial sclerotic lesion, strongly favor metastasis. Hypermetabolic osseous sarcoid has been described but is not typically sclerotic. 4. New small right pleural effusion since 06/25/2022.  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  Initially thought to be a viral URI, patient was prescribed a prednisone taper, with no improvement.  Per charting, hoarseness started after patient's son startled the patient, and patient screamed very loud.  Weight 56 kg.  05/18/2022: Patient seen in emergency department for concerns of hoarseness, anorexia, cough with worsening sputum.  At that time patient was also diagnosed with upper respiratory infection syndrome with positive COVID test and also diagnosed with frontal sinusitis and discharged on Augmentin.  05/24/2022: Patient came in to the office, and patient had symptoms of fever, night sweats, productive cough.  Patient was  then referred to ENT for more evaluation.  Given chest x-ray findings did not show any apical posterior consolidations or hilar opacities, TB was not of concern at that time.  Weight 52.7 kg  06/18/2022: Initial consult with otolaryngology with Dr.Skotnicki in which Dr. Isaias Cowman recommended CT neck and chest with contrast.  She also recommended speech eval.  At the time, Dr. Isaias Cowman held off on injection of laryngealplasty.  01/05/724: Patient received CT chest soft tissue neck.  Findings concerning for lymphoma, tuberculosis, sarcoidosis or granulomatous infections.  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  Patient was referred to pulmonology.  07/07/2022: First appointment with pulmonology, in which Dr. Valeta Harms concern for lymphoproliferative disease versus sarcoidosis.  Ordered QuantiFERON gold, ESR, CRP, ACE level, and PET scan.  Plan was to follow-up and consider bronchoscopy if QuantiFERON was positive for nodal sampling and tissue cultures.  Weight 52.3 kg  07/09/2022: Patient seen back in [facility].  At that time LDH, CRP, ESR, and ACE levels were all within normal limits.  CK was ordered.  Patient was instructed to follow-up with pulmonology.  QuantiFERON gold test later that day resulted as a positive test.  CK was negative.  07/16/2022: Patient had modified barium swallow which showed moderate dysphagia, But no significant risk for aspiration.  Plan was to continue with voice therapy per speech therapy.  07/28/2022:  Patient returned back to pulmonology.  Plan for home with positive.  Plan was to schedule a bronchoscopy for nodal sampling and tissue sampling.  Patient was also to get PET scan.  Weight 49 kg.  07/30/22: Patient did see internal medicine clinic.  Concern for decreased appetite, nausea, vomiting and poor p.o. intake, clinic recommended direct admission.  PET scan results concerning for small cell lung cancer versus lymphoma with hypermetabolic osseous lesions  consistent with  metastatic disease.  Patient admitted to IMTS.   07/31/22 -Says someone has discussed with her possibility of having cancer -States she has not eaten in a while, but feels better with nausea medication and ate breakfast here -Says she worries that her headache won't go away completely. It started two days ago and still having headache. No lightheadedness or dizziness.  -Asked about TB diagnosis and if she was treated  Resolved:  Hypokalemia, resolved Potassium improved after repletion with 20 mEq. Magnesium 1.9.
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
## 2022-07-30 NOTE — Assessment & Plan Note (Signed)
Acute pain. She does have a T2 lesion, which is concerning for metastatic disease vs skeletal disease from tuberculosis. Given her extensive disease and lack of response to recent therapies, I am going to prescribe her a short course of hydrocodone/acetaminophen for severe pain. She does have a documented history of itching with other narcotics. Advised her to closely monitor symptoms and stop if she develops adverse reaction. Medication education provided. She was advised to not drive after taking this or exceed daily acetaminophen dosing. No aberrant behavior present. PDMP reviewed.
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
## 2022-07-30 NOTE — Assessment & Plan Note (Addendum)
She has a documented history of latent TB; although, patient does not recall ever being diagnosed or treated for this. CT findings concerning for infectious process such as TB vs lymphoproliferative disorder. There was also a question of granulomatous disease but less likely with lab findings/normal ACE level. She had a positive Quantiferon Gold test. Reviewed with Dr. Valeta Harms, who has discussed with ID as well. She will need to have bronchoscopy with EBUS and nodal sampling for further evaluation. Will determine next steps based on these results. We will attempt to collect sputum cultures while we get this scheduled as well. She will attend PET scan tomorrow, as not able to entirely rule out lymphoproliferative disorder at this point.  She is non-toxic appearing and VS stable today. Appropriate to continue monitoring outpatient and wait on bronchoscopy results. Return/ED precautions provided.   Patient Instructions  Continue Albuterol inhaler 2 puffs every 6 hours as needed for shortness of breath or wheezing. Notify if symptoms persist despite rescue inhaler/neb use Continue ketorolac 1 tab every 8 hours as needed for mild to moderate pain Continue promethazine 1 tab every 6 hours as needed for nausea/vomiting  Hydrocodone-acetaminophen 5-325 mg tablets. Take 1 tablet every 6 hours as needed for moderate to severe pain. If this does not control your pain, you can take 2 tablets with subsequent doses. Do not drive after taking. If this causes itching, please stop and notify us. Do not take tylenol/acetaminophen while taking this   Someone will contact you to get bronchoscopy scheduled ASAP  Attend PET scan tomorrow  Plan to follow up with Dr. Valeta Harms after bronchoscopy to discuss results. If symptoms do not improve or worsen, please contact office for sooner follow up or seek emergency care.

## 2022-07-31 ENCOUNTER — Inpatient Hospital Stay (HOSPITAL_COMMUNITY): Payer: Commercial Managed Care - HMO

## 2022-07-31 DIAGNOSIS — Z227 Latent tuberculosis: Secondary | ICD-10-CM | POA: Diagnosis not present

## 2022-07-31 DIAGNOSIS — E860 Dehydration: Secondary | ICD-10-CM

## 2022-07-31 DIAGNOSIS — F419 Anxiety disorder, unspecified: Secondary | ICD-10-CM | POA: Diagnosis present

## 2022-07-31 DIAGNOSIS — J3800 Paralysis of vocal cords and larynx, unspecified: Secondary | ICD-10-CM

## 2022-07-31 DIAGNOSIS — R638 Other symptoms and signs concerning food and fluid intake: Secondary | ICD-10-CM | POA: Diagnosis not present

## 2022-07-31 DIAGNOSIS — R519 Headache, unspecified: Secondary | ICD-10-CM | POA: Diagnosis present

## 2022-07-31 DIAGNOSIS — R590 Localized enlarged lymph nodes: Secondary | ICD-10-CM

## 2022-07-31 DIAGNOSIS — R9389 Abnormal findings on diagnostic imaging of other specified body structures: Secondary | ICD-10-CM | POA: Diagnosis not present

## 2022-07-31 DIAGNOSIS — F418 Other specified anxiety disorders: Secondary | ICD-10-CM | POA: Diagnosis not present

## 2022-07-31 DIAGNOSIS — M549 Dorsalgia, unspecified: Secondary | ICD-10-CM | POA: Diagnosis present

## 2022-07-31 DIAGNOSIS — G8929 Other chronic pain: Secondary | ICD-10-CM | POA: Diagnosis present

## 2022-07-31 DIAGNOSIS — D649 Anemia, unspecified: Secondary | ICD-10-CM | POA: Diagnosis not present

## 2022-07-31 DIAGNOSIS — F32A Depression, unspecified: Secondary | ICD-10-CM | POA: Diagnosis present

## 2022-07-31 DIAGNOSIS — B351 Tinea unguium: Secondary | ICD-10-CM | POA: Diagnosis present

## 2022-07-31 DIAGNOSIS — Z79899 Other long term (current) drug therapy: Secondary | ICD-10-CM | POA: Diagnosis not present

## 2022-07-31 DIAGNOSIS — Z8616 Personal history of COVID-19: Secondary | ICD-10-CM | POA: Diagnosis not present

## 2022-07-31 DIAGNOSIS — E876 Hypokalemia: Secondary | ICD-10-CM | POA: Diagnosis present

## 2022-07-31 DIAGNOSIS — C801 Malignant (primary) neoplasm, unspecified: Secondary | ICD-10-CM | POA: Diagnosis not present

## 2022-07-31 DIAGNOSIS — R591 Generalized enlarged lymph nodes: Secondary | ICD-10-CM | POA: Diagnosis not present

## 2022-07-31 DIAGNOSIS — I10 Essential (primary) hypertension: Secondary | ICD-10-CM | POA: Diagnosis present

## 2022-07-31 DIAGNOSIS — R079 Chest pain, unspecified: Secondary | ICD-10-CM | POA: Diagnosis present

## 2022-07-31 DIAGNOSIS — G43909 Migraine, unspecified, not intractable, without status migrainosus: Secondary | ICD-10-CM | POA: Diagnosis present

## 2022-07-31 DIAGNOSIS — R1110 Vomiting, unspecified: Secondary | ICD-10-CM | POA: Diagnosis present

## 2022-07-31 DIAGNOSIS — J90 Pleural effusion, not elsewhere classified: Secondary | ICD-10-CM | POA: Diagnosis present

## 2022-07-31 DIAGNOSIS — R112 Nausea with vomiting, unspecified: Secondary | ICD-10-CM | POA: Diagnosis present

## 2022-07-31 DIAGNOSIS — R109 Unspecified abdominal pain: Secondary | ICD-10-CM | POA: Diagnosis present

## 2022-07-31 DIAGNOSIS — R918 Other nonspecific abnormal finding of lung field: Secondary | ICD-10-CM | POA: Diagnosis not present

## 2022-07-31 DIAGNOSIS — R64 Cachexia: Secondary | ICD-10-CM | POA: Diagnosis present

## 2022-07-31 DIAGNOSIS — Z8615 Personal history of latent tuberculosis infection: Secondary | ICD-10-CM | POA: Diagnosis not present

## 2022-07-31 DIAGNOSIS — C3401 Malignant neoplasm of right main bronchus: Secondary | ICD-10-CM | POA: Diagnosis present

## 2022-07-31 DIAGNOSIS — Z681 Body mass index (BMI) 19 or less, adult: Secondary | ICD-10-CM | POA: Diagnosis not present

## 2022-07-31 DIAGNOSIS — E781 Pure hyperglyceridemia: Secondary | ICD-10-CM | POA: Diagnosis present

## 2022-07-31 DIAGNOSIS — C7951 Secondary malignant neoplasm of bone: Secondary | ICD-10-CM | POA: Diagnosis present

## 2022-07-31 DIAGNOSIS — G4700 Insomnia, unspecified: Secondary | ICD-10-CM | POA: Diagnosis present

## 2022-07-31 DIAGNOSIS — Z1152 Encounter for screening for COVID-19: Secondary | ICD-10-CM | POA: Diagnosis not present

## 2022-07-31 HISTORY — DX: Headache, unspecified: R51.9

## 2022-07-31 LAB — BASIC METABOLIC PANEL
Anion gap: 10 (ref 5–15)
BUN: 16 mg/dL (ref 6–20)
CO2: 25 mmol/L (ref 22–32)
Calcium: 8.6 mg/dL — ABNORMAL LOW (ref 8.9–10.3)
Chloride: 99 mmol/L (ref 98–111)
Creatinine, Ser: 0.72 mg/dL (ref 0.44–1.00)
GFR, Estimated: >60 mL/min (ref >60)
Glucose, Bld: 100 mg/dL — ABNORMAL HIGH (ref 70–99)
Potassium: 3.9 mmol/L (ref 3.5–5.1)
Sodium: 134 mmol/L — ABNORMAL LOW (ref 135–145)

## 2022-07-31 LAB — CBC
HCT: 33.9 % — ABNORMAL LOW (ref 36.0–46.0)
Hemoglobin: 11.6 g/dL — ABNORMAL LOW (ref 12.0–15.0)
MCH: 31.5 pg (ref 26.0–34.0)
MCHC: 34.2 g/dL (ref 30.0–36.0)
MCV: 92.1 fL (ref 80.0–100.0)
Platelets: 342 10*3/uL (ref 150–400)
RBC: 3.68 MIL/uL — ABNORMAL LOW (ref 3.87–5.11)
RDW: 11.9 % (ref 11.5–15.5)
WBC: 9 10*3/uL (ref 4.0–10.5)
nRBC: 0 % (ref 0.0–0.2)

## 2022-07-31 LAB — HIV ANTIBODY (ROUTINE TESTING W REFLEX): HIV Screen 4th Generation wRfx: NONREACTIVE

## 2022-07-31 LAB — MAGNESIUM: Magnesium: 1.9 mg/dL (ref 1.7–2.4)

## 2022-07-31 MED ORDER — CLONAZEPAM 0.5 MG PO TABS
0.5000 mg | ORAL_TABLET | Freq: Two times a day (BID) | ORAL | Status: DC
  Administered 2022-07-31 – 2022-08-04 (×9): 0.5 mg via ORAL
  Filled 2022-07-31 (×9): qty 1

## 2022-07-31 MED ORDER — OXYCODONE HCL 5 MG PO TABS
5.0000 mg | ORAL_TABLET | Freq: Once | ORAL | Status: AC | PRN
  Administered 2022-07-31: 5 mg via ORAL
  Filled 2022-07-31: qty 1

## 2022-07-31 MED ORDER — LORAZEPAM 2 MG/ML IJ SOLN
0.5000 mg | Freq: Once | INTRAMUSCULAR | Status: AC
  Administered 2022-07-31: 0.5 mg via INTRAVENOUS
  Filled 2022-07-31: qty 1

## 2022-07-31 MED ORDER — GADOBUTROL 1 MMOL/ML IV SOLN
5.0000 mL | Freq: Once | INTRAVENOUS | Status: AC | PRN
  Administered 2022-07-31: 5 mL via INTRAVENOUS

## 2022-07-31 MED ORDER — ACETAMINOPHEN 325 MG PO TABS
650.0000 mg | ORAL_TABLET | Freq: Four times a day (QID) | ORAL | Status: DC
  Administered 2022-07-31 – 2022-08-01 (×4): 650 mg via ORAL
  Filled 2022-07-31 (×4): qty 2

## 2022-07-31 MED ORDER — HYDROCODONE-ACETAMINOPHEN 5-325 MG PO TABS
1.0000 | ORAL_TABLET | Freq: Four times a day (QID) | ORAL | Status: DC | PRN
  Administered 2022-08-01 – 2022-08-02 (×3): 2 via ORAL
  Filled 2022-07-31 (×3): qty 2

## 2022-07-31 MED ORDER — MAGNESIUM SULFATE 2 GM/50ML IV SOLN
2.0000 g | Freq: Once | INTRAVENOUS | Status: AC
  Administered 2022-07-31: 2 g via INTRAVENOUS
  Filled 2022-07-31: qty 50

## 2022-07-31 MED ORDER — DIPHENHYDRAMINE HCL 50 MG/ML IJ SOLN
12.5000 mg | Freq: Once | INTRAMUSCULAR | Status: AC
  Administered 2022-08-01: 12.5 mg via INTRAVENOUS
  Filled 2022-07-31: qty 1
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
## 2022-07-31 NOTE — Progress Notes (Signed)
Leaving for STAT MRI at this time; patient c/o anxiety d/t anticipation of procedure. Ordered ativan administered prior to leaving unit. Patient stable at this time.

## 2022-07-31 NOTE — Progress Notes (Signed)
HD#1 Subjective:   Summary: Karen [name] is a 57 y.o. female with PMH of latent TB (reportedly treated in 2013), PUD, hypertriglyceridemia, migraine, lumbar pain w/ radiculopathy, onychomycosis, anxiety, depression, insomnia who presents with intractable nausea and vomiting and admitted for dehydration.  Overnight Events: none  Patient sitting up in bed feeling better. No further nausea or vomiting after taking anti-emetic. Able to eat oatmeal for breakfast. Still has a headache.   Virtual spanish interpreter used during this encounter.   Objective:  Vital signs in last 24 hours: Vitals:   07/30/22 1852 07/30/22 1954 07/31/22 0347 07/31/22 0830  BP: 116/68 118/75 110/66 123/76  Pulse: 84 83 73   Resp: 18 16 16 16   Temp: 98.1 F (36.7 C) 97.6 F (36.4 C) 97.8 F (36.6 C) 97.8 F (36.6 C)  TempSrc: Oral Oral Oral Oral  SpO2: 99% 100% 96% 98%   Supplemental O2: Room Air SpO2: 98 %   Physical Exam:  Constitutional: thin-appearing female sitting up in bed, in no acute distress HENT: normocephalic atraumatic Cardiovascular: regular rate  Pulmonary/Chest: normal work of breathing on room air, lungs clear to auscultation bilaterally Neurological: alert & oriented x 3 Skin: warm and dry Psych: normal mood  There were no vitals filed for this visit.   Intake/Output Summary (Last 24 hours) at 07/31/2022 1627 Last data filed at 07/31/2022 0830 Gross per 24 hour  Intake 917.51 ml  Output --  Net 917.51 ml   Net IO Since Admission: 917.51 mL [07/31/22 1627]  Pertinent Labs:    Latest Ref Rng & Units 07/31/2022    4:01 AM 07/30/2022   10:15 PM 07/01/2022   11:22 AM  CBC  WBC 4.0 - 10.5 K/uL 9.0  11.6  7.7   Hemoglobin 12.0 - 15.0 g/dL 11.6  13.3  12.8   Hematocrit 36.0 - 46.0 % 33.9  37.5  37.5   Platelets 150 - 400 K/uL 342  386  340        Latest Ref Rng & Units 07/31/2022    4:01 AM 07/30/2022   10:15 PM 07/01/2022   11:22 AM  CMP  Glucose 70 - 99 mg/dL  100  124  93   BUN 6 - 20 mg/dL 16  19  17    Creatinine 0.44 - 1.00 mg/dL 0.72  0.80  0.63   Sodium 135 - 145 mmol/L 134  135  137   Potassium 3.5 - 5.1 mmol/L 3.9  3.3  4.3   Chloride 98 - 111 mmol/L 99  100  103   CO2 22 - 32 mmol/L 25  22  24    Calcium 8.9 - 10.3 mg/dL 8.6  9.5  9.8   Total Protein 6.5 - 8.1 g/dL  7.3  7.5   Total Bilirubin 0.3 - 1.2 mg/dL  0.6  0.6   Alkaline Phos 38 - 126 U/L  104  79   AST 15 - 41 U/L  23  15   ALT 0 - 44 U/L  12  8     Imaging: No results found.  Assessment/Plan:   Active Problems:   Mediastinal adenopathy   Dehydration  Patient Summary: Karen [name] is a 57 y.o. with PMH of latent TB (reportedly treated in 2013), PUD, hypertriglyceridemia, migraine, lumbar pain w/ radiculopathy, anxiety, depression who presents with intractable nausea and vomiting and admitted for dehydration.  Dehydration Decreased appetite  Nausea and vomiting  Patient able to eat breakfast this morning without much  n/v after medication. No evidence of AKI. S/p about 2L of IV fluids. Orthostatics unremarkable. Continue supportive care. Poor po intake, weight loss and recent imaging findings, likely concern for malignancy.  -encourage po intake, on regular diet -zofran PRN for nausea -continue workup listed below  Mediastinal lymphadenopathy Concern for malignancy Latent TB Vocal cord paralysis Has been worked up in outpatient for mediastinal lymphadenopathy. Quantiferon gold positive 3 weeks ago. Last CT chest showed heterogenous enhancing nodular soft tissue density within mediastinum and hilum bilaterally w/ central necrosis within nodes. Pulmonology planned for bronchoscopy on 2/14 with tissue sampling. PET scan concern for metastatic malignancy small cell lung cancer vs lymphoma. Appears patient was informed and aware of results. Pulmonology consulted and unsure if they can expedite bronchoscopy. Given intractable nausea and vomiting along with headache,  will need further imaging for concern of brain metastasis.  -appreciate pulmonology assistance -f/u MRI brain w wo contrast to rule out brain mets  -original bronchoscopy scheduled for 2/14 -restarted home Norco 5-325 mg 1-2 tabs q12h PRN -air droplet and contact precautions  Hypokalemia, resolved Potassium improved after repletion with 20 mEq. Magnesium 1.9.   Anxiety Depression Insomnia Home meds include sertraline, klonopin and trazodone. Will continue her home meds.  -continue home sertraline 100 mg BID -continue home klonopin 0.5 mg BID -continue home trazodone 150 mg nightly   Diet: Normal IVF: None,None VTE: Enoxaparin Code: Full TOC recs: TBD Family Update: no family present at bedside, will attempt to update family    Dispo: Anticipated discharge to Home pending medical stability and workup  Angelique Blonder, DO Internal Medicine Resident PGY-1 Pager: [phone] Please contact the on call pager after 5 pm and on weekends at [phone].

## 2022-08-01 DIAGNOSIS — J3800 Paralysis of vocal cords and larynx, unspecified: Secondary | ICD-10-CM

## 2022-08-01 DIAGNOSIS — R590 Localized enlarged lymph nodes: Secondary | ICD-10-CM | POA: Diagnosis not present

## 2022-08-01 LAB — CBC
HCT: 32.7 % — ABNORMAL LOW (ref 36.0–46.0)
Hemoglobin: 11 g/dL — ABNORMAL LOW (ref 12.0–15.0)
MCH: 31.1 pg (ref 26.0–34.0)
MCHC: 33.6 g/dL (ref 30.0–36.0)
MCV: 92.4 fL (ref 80.0–100.0)
Platelets: 315 10*3/uL (ref 150–400)
RBC: 3.54 MIL/uL — ABNORMAL LOW (ref 3.87–5.11)
RDW: 11.7 % (ref 11.5–15.5)
WBC: 9.4 10*3/uL (ref 4.0–10.5)
nRBC: 0 % (ref 0.0–0.2)

## 2022-08-01 LAB — BASIC METABOLIC PANEL
Anion gap: 7 (ref 5–15)
BUN: 8 mg/dL (ref 6–20)
CO2: 26 mmol/L (ref 22–32)
Calcium: 8.7 mg/dL — ABNORMAL LOW (ref 8.9–10.3)
Chloride: 103 mmol/L (ref 98–111)
Creatinine, Ser: 0.61 mg/dL (ref 0.44–1.00)
GFR, Estimated: >60 mL/min (ref >60)
Glucose, Bld: 113 mg/dL — ABNORMAL HIGH (ref 70–99)
Potassium: 3.9 mmol/L (ref 3.5–5.1)
Sodium: 136 mmol/L (ref 135–145)

## 2022-08-01 MED ORDER — BOOST / RESOURCE BREEZE PO LIQD CUSTOM
1.0000 | Freq: Three times a day (TID) | ORAL | Status: DC
  Administered 2022-08-01 – 2022-08-04 (×5): 1 via ORAL

## 2022-08-01 MED ORDER — GUAIFENESIN ER 600 MG PO TB12
600.0000 mg | ORAL_TABLET | Freq: Two times a day (BID) | ORAL | Status: DC
  Administered 2022-08-01 – 2022-08-04 (×6): 600 mg via ORAL
  Filled 2022-08-01 (×6): qty 1

## 2022-08-01 MED ORDER — METHOCARBAMOL 500 MG PO TABS
500.0000 mg | ORAL_TABLET | Freq: Three times a day (TID) | ORAL | Status: DC
  Administered 2022-08-01 – 2022-08-04 (×10): 500 mg via ORAL
  Filled 2022-08-01 (×10): qty 1

## 2022-08-01 MED ORDER — ADULT MULTIVITAMIN W/MINERALS CH
1.0000 | ORAL_TABLET | Freq: Every day | ORAL | Status: DC
  Administered 2022-08-01 – 2022-08-02 (×2): 1 via ORAL
  Filled 2022-08-01 (×2): qty 1

## 2022-08-01 MED ORDER — METHOCARBAMOL 500 MG PO TABS: 500.0000 mg | ORAL_TABLET | Freq: Three times a day (TID) | ORAL | Status: DC

## 2022-08-01 MED ORDER — SUMATRIPTAN SUCCINATE 25 MG PO TABS
25.0000 mg | ORAL_TABLET | ORAL | Status: DC | PRN
  Administered 2022-08-01: 25 mg via ORAL
  Filled 2022-08-01 (×2): qty 1

## 2022-08-01 NOTE — Consult Note (Signed)
NAME:  Karen [name], MRN:  [id number], DOB:  [birth year], LOS: 2 ADMISSION DATE:  07/30/2022, CONSULTATION DATE:  2/10 REFERRING MD:  Posey Pronto, CHIEF COMPLAINT:  abnormal CT chest    History of Present Illness:  57 year old female last seen in our office on 2/7 being followed by Dr. Jamey Ripa for mediastinal lymphadenopathy, with areas of necrosis within this adenopathy.  She has a positive QuantiFERON gold.  The case had been extensively evaluated by Dr. Jamey Ripa who also spoke with infectious disease with concern that her CT findings could represent either infectious process such as tuberculosis versus lymphoproliferative disorder.  She was actually planned for EBUS on 2/14.  She presents on 2/10 with intractable nausea and vomiting.  As noted in previous records continues to have night sweats and weight loss.  She has had worsening voice and phonation, and chronic pain in her back, chest, head and legs.  She was admitted for intractable nausea and vomiting.  Of note just recently had her PET scan completed on 2/9 this showed hypermetabolic thoracic/low cervical adenopathy and pulmonary nodularity favoring concern for small cell cancer versus lymphoma she also had metabolic osseous lesions involving the right posterior acetabular and ischial lesion.  Also a small right pleural effusion.  Pulmonary was asked to see inpatient to see if we could help expedite diagnostic evaluation.  Pertinent  Medical History  Latent tuberculosis, chronic abdominal pain chronic back pain depression hypertension IBS migraines Significant Hospital Events: Including procedures, antibiotic start and stop dates in addition to other pertinent events   2/9 PET scan.  Hypermetabolic thoracic/low cervical adenopathy with pulmonary nodularity.  Hypermetabolic osseous lesions. Admitted for intractable nausea and vomiting on 2/9 2/10 pulmonary asked to evaluate to see if we could help expedite diagnostic evaluation, EBUS  already scheduled for 2/14  Interim History / Subjective:    2/11 - Nausea and vomit and headache all improved. Continues to be stable. MRI without brain mets.   Objective   Blood pressure (!) 115/56, pulse 78, temperature 98.4 F (36.9 C), temperature source Oral, resp. rate 14, last menstrual period 02/08/2006, SpO2 95 %.        Intake/Output Summary (Last 24 hours) at 08/01/2022 1415 Last data filed at 07/31/2022 1600 Gross per 24 hour  Intake 682.96 ml  Output --  Net 682.96 ml    There were no vitals filed for this visit.  Examination: General: No distress. Lean cachectic Neuro: Alert and Oriented x 3. GCS 15. Speech normal Psych: Pleasant Resp:  Barrel Chest - no.  Wheeze - no, Crackles - no, No overt respiratory distress CVS: Normal heart sounds. Murmurs - no Ext: Stigmata of Connective Tissue Disease - no HEENT: Normal upper airway. PEERL +. No post nasal drip   Resolved Hospital Problem list     Assessment & Plan:  Intractable nausea and vomiting Chronic pain Dehydration History of vocal cord paralysis Mediastinal lymphadenopathy with necrotic lesions, weight loss, hoarseness, and night sweats History of latent tuberculosis  Discussion Primary concern here is infectious process versus malignancy.  Already scheduled for EBUS on 14th. MRI brain negative for mets. Dehydration improved 08/01/22 a Headache better  Plan - d/w Dr Valeta Harms   NPO after 2am 08/02/22  DC lovenox   Aim for EBUS 08/02/22 - TENTATIVE END OF THE DAY - case request made   - Risks of pneumothorax, hemothorax, sedation/anesthesia complications such as cardiac or respiratory arrest or hypotension, stroke and bleeding all explained. Benefits of diagnosis but limitations of non-diagnosis  also explained. Patient verbalized understanding and wished to proceed.     - Patient aware  - using google audio translate   Best Practice (right click and "Reselect all SmartList Selections" daily)    Per prima      SIGNATURE    Dr. Brand Males, M.D., F.C.C.P,  Pulmonary and Critical Care Medicine Staff Physician, [place] Director - Interstitial Lung Disease  Program  Medical Director - [facility] [place] [facility] [place], [place], [address]   Pager: [phone], If no answer  -[place] or Try [phone] Telephone (clinical office): [phone] Telephone (research): [phone]  2:16 PM 08/01/2022   LABS    PULMONARY No results for input(s): "PHART", "PCO2ART", "PO2ART", "HCO3", "TCO2", "O2SAT" in the last 168 hours.  Invalid input(s): "PCO2", "PO2"  CBC Recent Labs  Lab 07/30/22 2215 07/31/22 0401 08/01/22 0348  HGB 13.3 11.6* 11.0*  HCT 37.5 33.9* 32.7*  WBC 11.6* 9.0 9.4  PLT 386 342 315    COAGULATION No results for input(s): "INR" in the last 168 hours.  CARDIAC  No results for input(s): "TROPONINI" in the last 168 hours. No results for input(s): "PROBNP" in the last 168 hours.   CHEMISTRY Recent Labs  Lab 07/30/22 2215 07/31/22 0401 08/01/22 0348  NA 135 134* 136  K 3.3* 3.9 3.9  CL 100 99 103  CO2 22 25 26   GLUCOSE 124* 100* 113*  BUN 19 16 8   CREATININE 0.80 0.72 0.61  CALCIUM 9.5 8.6* 8.7*  MG  --  1.9  --    Estimated Creatinine Clearance: 60.7 mL/min (by C-G formula based on SCr of 0.61 mg/dL).   LIVER Recent Labs  Lab 07/30/22 2215  AST 23  ALT 12  ALKPHOS 104  BILITOT 0.6  PROT 7.3  ALBUMIN 4.2     INFECTIOUS No results for input(s): "LATICACIDVEN", "PROCALCITON" in the last 168 hours.   ENDOCRINE CBG (last 3)  No results for input(s): "GLUCAP" in the last 72 hours.       IMAGING x48h  - image(s) personally visualized  -   highlighted in bold MR BRAIN W WO CONTRAST  Result Date: 08/01/2022 CLINICAL DATA:  Initial evaluation for headache, evaluate for metastatic disease. EXAM: MRI HEAD WITHOUT AND WITH CONTRAST  TECHNIQUE: Multiplanar, multiecho pulse sequences of the brain and surrounding structures were obtained without and with intravenous contrast. CONTRAST:  49mL GADAVIST GADOBUTROL 1 MMOL/ML IV SOLN COMPARISON:  Prior head CT from 07/01/2022. FINDINGS: Brain: Cerebral volume within normal limits. Scattered patchy T2/FLAIR hyperintensity involving the supratentorial cerebral white matter, nonspecific, but overall mild for age. No evidence for acute or subacute ischemia. Gray-white matter differentiation maintained. No areas of chronic cortical infarction. No acute or chronic intracranial blood products. No mass lesion, midline shift or mass effect. Ventricles normal size without hydrocephalus. Pituitary gland and suprasellar region normal. No abnormal enhancement or evidence for metastatic disease. Vascular: Major intracranial vascular flow voids are maintained. Skull and upper cervical spine: Craniocervical junction within normal limits. Bone marrow signal intensity normal. No focal marrow replacing lesion. No scalp soft tissue abnormality. Sinuses/Orbits: Globes orbital soft tissues within normal limits. Paranasal sinuses are largely clear. Trace right mastoid effusion, of doubtful significance. Other: None. IMPRESSION: 1. No acute intracranial abnormality. No evidence for intracranial metastatic disease. 2. Mild patchy T2/FLAIR hyperintensity involving the supratentorial cerebral white matter, nonspecific, but most commonly related to chronic  microvascular ischemic disease. Electronically Signed   By: Jeannine Boga M.D.   On: 08/01/2022 01:54

## 2022-08-01 NOTE — Plan of Care (Signed)
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
## 2022-08-01 NOTE — Progress Notes (Addendum)
HD#2 Subjective:   Summary: Karen [name] is a 57 y.o. female with PMH of latent TB (reportedly treated in 2013), PUD, hypertriglyceridemia, migraine, lumbar pain w/ radiculopathy, onychomycosis, anxiety, depression, insomnia who presents with intractable nausea and vomiting, pain, poor intake, and dehydration directly admitted from [facility] 07/30/22 for management of these symptoms in the setting of suspected metastatic cancer identified in the outpatient setting, w/u underway.  Overnight Events: none  Patient still has headache but improved after medication (yesterday HA was so severe that the medication didn't help). Some nausea but no vomiting today. Able to eat some dinner last night but still low appetite. Tearful during encounter after discussion of concern for malignancy which she was aware of. Provided support for patient and addressed her concerns.   Virtual spanish interpreter used during this encounter.   Objective:  Vital signs in last 24 hours: Vitals:   07/31/22 0347 07/31/22 0830 07/31/22 1941 08/01/22 0542  BP: 110/66 123/76 134/61 (!) 106/57  Pulse: 73  79 72  Resp: 16 16 16 12   Temp: 97.8 F (36.6 C) 97.8 F (36.6 C) 98.4 F (36.9 C) 98.4 F (36.9 C)  TempSrc: Oral Oral Oral Oral  SpO2: 96% 98% 97%    Supplemental O2: Room Air SpO2: 97 %   Physical Exam:  Constitutional: thin-appearing female sitting up in bed, in no acute distress HENT: normocephalic atraumatic Cardiovascular: regular rate  Pulmonary/Chest: normal work of breathing on room air Neurological: alert & oriented Skin: warm and dry Psych: tearful during encounter  There were no vitals filed for this visit.   Intake/Output Summary (Last 24 hours) at 08/01/2022 0638 Last data filed at 07/31/2022 1600 Gross per 24 hour  Intake 1600.47 ml  Output --  Net 1600.47 ml    Net IO Since Admission: 1,600.47 mL [08/01/22 0638]  Pertinent Labs:    Latest Ref Rng & Units 08/01/2022    3:48  AM 07/31/2022    4:01 AM 07/30/2022   10:15 PM  CBC  WBC 4.0 - 10.5 K/uL 9.4  9.0  11.6   Hemoglobin 12.0 - 15.0 g/dL 11.0  11.6  13.3   Hematocrit 36.0 - 46.0 % 32.7  33.9  37.5   Platelets 150 - 400 K/uL 315  342  386        Latest Ref Rng & Units 08/01/2022    3:48 AM 07/31/2022    4:01 AM 07/30/2022   10:15 PM  CMP  Glucose 70 - 99 mg/dL 113  100  124   BUN 6 - 20 mg/dL 8  16  19    Creatinine 0.44 - 1.00 mg/dL 0.61  0.72  0.80   Sodium 135 - 145 mmol/L 136  134  135   Potassium 3.5 - 5.1 mmol/L 3.9  3.9  3.3   Chloride 98 - 111 mmol/L 103  99  100   CO2 22 - 32 mmol/L 26  25  22    Calcium 8.9 - 10.3 mg/dL 8.7  8.6  9.5   Total Protein 6.5 - 8.1 g/dL   7.3   Total Bilirubin 0.3 - 1.2 mg/dL   0.6   Alkaline Phos 38 - 126 U/L   104   AST 15 - 41 U/L   23   ALT 0 - 44 U/L   12     Imaging: MR BRAIN W WO CONTRAST  Result Date: 08/01/2022 CLINICAL DATA:  Initial evaluation for headache, evaluate for metastatic disease. EXAM: MRI HEAD  WITHOUT AND WITH CONTRAST TECHNIQUE: Multiplanar, multiecho pulse sequences of the brain and surrounding structures were obtained without and with intravenous contrast. CONTRAST:  95mL GADAVIST GADOBUTROL 1 MMOL/ML IV SOLN COMPARISON:  Prior head CT from 07/01/2022. FINDINGS: Brain: Cerebral volume within normal limits. Scattered patchy T2/FLAIR hyperintensity involving the supratentorial cerebral white matter, nonspecific, but overall mild for age. No evidence for acute or subacute ischemia. Gray-white matter differentiation maintained. No areas of chronic cortical infarction. No acute or chronic intracranial blood products. No mass lesion, midline shift or mass effect. Ventricles normal size without hydrocephalus. Pituitary gland and suprasellar region normal. No abnormal enhancement or evidence for metastatic disease. Vascular: Major intracranial vascular flow voids are maintained. Skull and upper cervical spine: Craniocervical junction within normal limits.  Bone marrow signal intensity normal. No focal marrow replacing lesion. No scalp soft tissue abnormality. Sinuses/Orbits: Globes orbital soft tissues within normal limits. Paranasal sinuses are largely clear. Trace right mastoid effusion, of doubtful significance. Other: None. IMPRESSION: 1. No acute intracranial abnormality. No evidence for intracranial metastatic disease. 2. Mild patchy T2/FLAIR hyperintensity involving the supratentorial cerebral white matter, nonspecific, but most commonly related to chronic microvascular ischemic disease. Electronically Signed   By: Jeannine Boga M.D.   On: 08/01/2022 01:54    Assessment/Plan:   Principal Problem:   Malignancy ([place]) Active Problems:   TB lung, latent   Mediastinal adenopathy   Dehydration   Headache   Nausea and vomiting   Intractable vomiting  Patient Summary: Karen [name] is a 57 y.o. with PMH of latent TB (reportedly treated in 2013), PUD, hypertriglyceridemia, migraine, lumbar pain w/ radiculopathy, anxiety, depression who presents with intractable nausea and vomiting and admitted for dehydration.  Dehydration Decreased appetite  Nausea and vomiting  Some nausea and no vomiting. Able to have some po intake yesterday. Denies further lightheadedness or dizziness. Still has headache. Continue supportive care. Medications to help with nausea and headache.  -encourage po intake, on regular diet -zofran PRN for nausea -continue workup listed below  Mediastinal lymphadenopathy Concern for malignancy Latent TB Vocal cord paralysis Has been worked up in outpatient for mediastinal lymphadenopathy. Quantiferon gold positive 3 weeks ago. Last CT chest showed heterogenous enhancing nodular soft tissue density within mediastinum and hilum bilaterally w/ central necrosis within nodes. Pulmonology planned for bronchoscopy on 2/14 with tissue sampling. PET scan concern for metastatic malignancy small cell lung cancer vs lymphoma.  Pulmonology consulted and unsure if they can expedite bronchoscopy, will see on Monday. Brain MRI negative for brain mets.  -appreciate pulmonology assistance -original bronchoscopy scheduled for 2/14 -continue home Norco 5-325 mg 1-2 tabs q6h PRN, bowel regimen in place -air droplet and contact precautions  Headache Hx of migraine Headache has improved but still persistent. Hx of migraine and patient states this headache is similar to prior headaches. Upon chart review, was on sumatriptan before so could trial and see if that helps.  -home Norco 5-325 mg 1-2 tabs q5h PRN  -scheduled tylenol 650 mg q6h -start sumatriptan 25 mg q2h PRN  Anxiety Depression Insomnia Home meds include sertraline, klonopin and trazodone. Will continue her home meds.  -continue home sertraline 100 mg BID -continue home klonopin 0.5 mg BID -continue home trazodone 150 mg nightly   Diet: Normal IVF: None,None VTE: Enoxaparin Code: Full TOC recs: TBD Family Update: family not present at bedside today  Dispo: Anticipated discharge to Home pending medical stability and workup  Angelique Blonder, DO Internal Medicine Resident PGY-1 Pager: [phone] Please contact the  on call pager after 5 pm and on weekends at [phone].

## 2022-08-01 NOTE — Discharge Instructions (Signed)

## 2022-08-01 NOTE — H&P (View-Only) (Signed)
NAME:  Karen [name], MRN:  [id number], DOB:  1965-09-23, LOS: 2 ADMISSION DATE:  07/30/2022, CONSULTATION DATE:  2/10 REFERRING MD:  Posey Pronto, CHIEF COMPLAINT:  abnormal CT chest    History of Present Illness:  57 year old female last seen in our office on 2/7 being followed by Dr. Jamey Ripa for mediastinal lymphadenopathy, with areas of necrosis within this adenopathy.  She has a positive QuantiFERON gold.  The case had been extensively evaluated by Dr. Jamey Ripa who also spoke with infectious disease with concern that her CT findings could represent either infectious process such as tuberculosis versus lymphoproliferative disorder.  She was actually planned for EBUS on 2/14.  She presents on 2/10 with intractable nausea and vomiting.  As noted in previous records continues to have night sweats and weight loss.  She has had worsening voice and phonation, and chronic pain in her back, chest, head and legs.  She was admitted for intractable nausea and vomiting.  Of note just recently had her PET scan completed on 2/9 this showed hypermetabolic thoracic/low cervical adenopathy and pulmonary nodularity favoring concern for small cell cancer versus lymphoma she also had metabolic osseous lesions involving the right posterior acetabular and ischial lesion.  Also a small right pleural effusion.  Pulmonary was asked to see inpatient to see if we could help expedite diagnostic evaluation.  Pertinent  Medical History  Latent tuberculosis, chronic abdominal pain chronic back pain depression hypertension IBS migraines Significant Hospital Events: Including procedures, antibiotic start and stop dates in addition to other pertinent events   2/9 PET scan.  Hypermetabolic thoracic/low cervical adenopathy with pulmonary nodularity.  Hypermetabolic osseous lesions. Admitted for intractable nausea and vomiting on 2/9 2/10 pulmonary asked to evaluate to see if we could help expedite diagnostic evaluation, EBUS  already scheduled for 2/14  Interim History / Subjective:    2/11 - Nausea and vomit and headache all improved. Continues to be stable. MRI without brain mets.   Objective   Blood pressure (!) 115/56, pulse 78, temperature 98.4 F (36.9 C), temperature source Oral, resp. rate 14, last menstrual period 02/08/2006, SpO2 95 %.        Intake/Output Summary (Last 24 hours) at 08/01/2022 1415 Last data filed at 07/31/2022 1600 Gross per 24 hour  Intake 682.96 ml  Output --  Net 682.96 ml    There were no vitals filed for this visit.  Examination: General: No distress. Lean cachectic Neuro: Alert and Oriented x 3. GCS 15. Speech normal Psych: Pleasant Resp:  Barrel Chest - no.  Wheeze - no, Crackles - no, No overt respiratory distress CVS: Normal heart sounds. Murmurs - no Ext: Stigmata of Connective Tissue Disease - no HEENT: Normal upper airway. PEERL +. No post nasal drip   Resolved Hospital Problem list     Assessment & Plan:  Intractable nausea and vomiting Chronic pain Dehydration History of vocal cord paralysis Mediastinal lymphadenopathy with necrotic lesions, weight loss, hoarseness, and night sweats History of latent tuberculosis  Discussion Primary concern here is infectious process versus malignancy.  Already scheduled for EBUS on 14th. MRI brain negative for mets. Dehydration improved 08/01/22 a Headache better  Plan - d/w Dr Valeta Harms   NPO after 2am 08/02/22  DC lovenox   Aim for EBUS 08/02/22 - TENTATIVE END OF THE DAY - case request made   - Risks of pneumothorax, hemothorax, sedation/anesthesia complications such as cardiac or respiratory arrest or hypotension, stroke and bleeding all explained. Benefits of diagnosis but limitations of non-diagnosis  also explained. Patient verbalized understanding and wished to proceed.     - Patient aware  - using google audio translate   Best Practice (right click and "Reselect all SmartList Selections" daily)    Per prima      SIGNATURE    Dr. Brand Males, M.D., F.C.C.P,  Pulmonary and Critical Care Medicine Staff Physician, [place] Director - Interstitial Lung Disease  Program  Medical Director - [facility] [place] [facility] [place], [place], [address]   Pager: [phone], If no answer  -[place] or [place] [phone] Telephone (clinical office): [phone] Telephone (research): [phone]  2:16 PM 08/01/2022   LABS    PULMONARY No results for input(s): "PHART", "PCO2ART", "PO2ART", "HCO3", "TCO2", "O2SAT" in the last 168 hours.  Invalid input(s): "PCO2", "PO2"  CBC Recent Labs  Lab 07/30/22 2215 07/31/22 0401 08/01/22 0348  HGB 13.3 11.6* 11.0*  HCT 37.5 33.9* 32.7*  WBC 11.6* 9.0 9.4  PLT 386 342 315    COAGULATION No results for input(s): "INR" in the last 168 hours.  CARDIAC  No results for input(s): "TROPONINI" in the last 168 hours. No results for input(s): "PROBNP" in the last 168 hours.   CHEMISTRY Recent Labs  Lab 07/30/22 2215 07/31/22 0401 08/01/22 0348  NA 135 134* 136  K 3.3* 3.9 3.9  CL 100 99 103  CO2 22 25 26   GLUCOSE 124* 100* 113*  BUN 19 16 8   CREATININE 0.80 0.72 0.61  CALCIUM 9.5 8.6* 8.7*  MG  --  1.9  --    Estimated Creatinine Clearance: 60.7 mL/min (by C-G formula based on SCr of 0.61 mg/dL).   LIVER Recent Labs  Lab 07/30/22 2215  AST 23  ALT 12  ALKPHOS 104  BILITOT 0.6  PROT 7.3  ALBUMIN 4.2     INFECTIOUS No results for input(s): "LATICACIDVEN", "PROCALCITON" in the last 168 hours.   ENDOCRINE CBG (last 3)  No results for input(s): "GLUCAP" in the last 72 hours.       IMAGING x48h  - image(s) personally visualized  -   highlighted in bold MR BRAIN W WO CONTRAST  Result Date: 08/01/2022 CLINICAL DATA:  Initial evaluation for headache, evaluate for metastatic disease. EXAM: MRI HEAD WITHOUT AND WITH CONTRAST  TECHNIQUE: Multiplanar, multiecho pulse sequences of the brain and surrounding structures were obtained without and with intravenous contrast. CONTRAST:  10mL GADAVIST GADOBUTROL 1 MMOL/ML IV SOLN COMPARISON:  Prior head CT from 07/01/2022. FINDINGS: Brain: Cerebral volume within normal limits. Scattered patchy T2/FLAIR hyperintensity involving the supratentorial cerebral white matter, nonspecific, but overall mild for age. No evidence for acute or subacute ischemia. Gray-white matter differentiation maintained. No areas of chronic cortical infarction. No acute or chronic intracranial blood products. No mass lesion, midline shift or mass effect. Ventricles normal size without hydrocephalus. Pituitary gland and suprasellar region normal. No abnormal enhancement or evidence for metastatic disease. Vascular: Major intracranial vascular flow voids are maintained. Skull and upper cervical spine: Craniocervical junction within normal limits. Bone marrow signal intensity normal. No focal marrow replacing lesion. No scalp soft tissue abnormality. Sinuses/Orbits: Globes orbital soft tissues within normal limits. Paranasal sinuses are largely clear. Trace right mastoid effusion, of doubtful significance. Other: None. IMPRESSION: 1. No acute intracranial abnormality. No evidence for intracranial metastatic disease. 2. Mild patchy T2/FLAIR hyperintensity involving the supratentorial cerebral white matter, nonspecific, but most commonly related to chronic  microvascular ischemic disease. Electronically Signed   By: Jeannine Boga M.D.   On: 08/01/2022 01:54

## 2022-08-02 ENCOUNTER — Encounter (HOSPITAL_COMMUNITY)
Disposition: A | Discharge status: Home / Self Care | Payer: Self-pay | Source: Home / Self Care | Attending: Internal Medicine

## 2022-08-02 ENCOUNTER — Inpatient Hospital Stay (HOSPITAL_COMMUNITY): Payer: Commercial Managed Care - HMO | Admitting: Critical Care Medicine

## 2022-08-02 ENCOUNTER — Encounter (HOSPITAL_COMMUNITY): Payer: Self-pay | Admitting: Internal Medicine

## 2022-08-02 DIAGNOSIS — F418 Other specified anxiety disorders: Secondary | ICD-10-CM

## 2022-08-02 DIAGNOSIS — R918 Other nonspecific abnormal finding of lung field: Secondary | ICD-10-CM

## 2022-08-02 DIAGNOSIS — R112 Nausea with vomiting, unspecified: Secondary | ICD-10-CM

## 2022-08-02 DIAGNOSIS — I10 Essential (primary) hypertension: Secondary | ICD-10-CM | POA: Diagnosis not present

## 2022-08-02 DIAGNOSIS — Z227 Latent tuberculosis: Secondary | ICD-10-CM | POA: Diagnosis not present

## 2022-08-02 DIAGNOSIS — R590 Localized enlarged lymph nodes: Secondary | ICD-10-CM | POA: Diagnosis not present

## 2022-08-02 DIAGNOSIS — R638 Other symptoms and signs concerning food and fluid intake: Secondary | ICD-10-CM | POA: Diagnosis not present

## 2022-08-02 DIAGNOSIS — D649 Anemia, unspecified: Secondary | ICD-10-CM

## 2022-08-02 DIAGNOSIS — C801 Malignant (primary) neoplasm, unspecified: Secondary | ICD-10-CM

## 2022-08-02 HISTORY — PX: VIDEO BRONCHOSCOPY WITH ENDOBRONCHIAL ULTRASOUND: SHX6177

## 2022-08-02 HISTORY — PX: BRONCHIAL WASHINGS: SHX5105

## 2022-08-02 HISTORY — PX: BRONCHIAL NEEDLE ASPIRATION BIOPSY: SHX5106

## 2022-08-02 HISTORY — PX: VIDEO BRONCHOSCOPY: SHX5072

## 2022-08-02 LAB — SARS CORONAVIRUS 2 BY RT PCR: SARS Coronavirus 2 by RT PCR: NEGATIVE

## 2022-08-02 SURGERY — BRONCHOSCOPY, WITH EBUS
Anesthesia: General | Laterality: Bilateral

## 2022-08-02 MED ORDER — ONDANSETRON HCL 4 MG/2ML IJ SOLN
INTRAMUSCULAR | Status: DC | PRN
  Administered 2022-08-02: 4 mg via INTRAVENOUS

## 2022-08-02 MED ORDER — PROPOFOL 10 MG/ML IV BOLUS
INTRAVENOUS | Status: DC | PRN
  Administered 2022-08-02: 20 mg via INTRAVENOUS
  Administered 2022-08-02: 30 mg via INTRAVENOUS
  Administered 2022-08-02: 120 mg via INTRAVENOUS

## 2022-08-02 MED ORDER — ACETAMINOPHEN 160 MG/5ML PO SOLN
650.0000 mg | Freq: Four times a day (QID) | ORAL | Status: DC
  Administered 2022-08-03 – 2022-08-04 (×4): 650 mg via ORAL
  Filled 2022-08-02 (×8): qty 20.3

## 2022-08-02 MED ORDER — ROCURONIUM BROMIDE 10 MG/ML (PF) SYRINGE
PREFILLED_SYRINGE | INTRAVENOUS | Status: DC | PRN
  Administered 2022-08-02: 40 mg via INTRAVENOUS

## 2022-08-02 MED ORDER — SUCCINYLCHOLINE CHLORIDE 200 MG/10ML IV SOSY
PREFILLED_SYRINGE | INTRAVENOUS | Status: DC | PRN
  Administered 2022-08-02: 100 mg via INTRAVENOUS

## 2022-08-02 MED ORDER — HYDROCODONE-ACETAMINOPHEN 7.5-325 MG/15ML PO SOLN
15.0000 mL | Freq: Four times a day (QID) | ORAL | Status: DC | PRN
  Administered 2022-08-03 – 2022-08-04 (×2): 15 mL via ORAL
  Filled 2022-08-02 (×2): qty 15

## 2022-08-02 MED ORDER — DEXAMETHASONE SODIUM PHOSPHATE 10 MG/ML IJ SOLN
INTRAMUSCULAR | Status: DC | PRN
  Administered 2022-08-02: 4 mg via INTRAVENOUS

## 2022-08-02 MED ORDER — ADULT MULTIVITAMIN LIQUID CH
15.0000 mL | Freq: Every day | ORAL | Status: DC
  Administered 2022-08-03 – 2022-08-04 (×2): 15 mL via ORAL
  Filled 2022-08-02 (×2): qty 15

## 2022-08-02 MED ORDER — SUGAMMADEX SODIUM 200 MG/2ML IV SOLN
INTRAVENOUS | Status: DC | PRN
  Administered 2022-08-02: 100 mg via INTRAVENOUS

## 2022-08-02 MED ORDER — PROPOFOL 500 MG/50ML IV EMUL
INTRAVENOUS | Status: DC | PRN
  Administered 2022-08-02: 100 ug/kg/min via INTRAVENOUS

## 2022-08-02 MED ORDER — LIDOCAINE VISCOUS HCL 2 % MT SOLN
15.0000 mL | OROMUCOSAL | Status: DC | PRN
  Administered 2022-08-02 – 2022-08-03 (×4): 15 mL via OROMUCOSAL
  Filled 2022-08-02 (×5): qty 15

## 2022-08-02 MED ORDER — LACTATED RINGERS IV SOLN: INTRAVENOUS | Status: DC | PRN

## 2022-08-02 MED ORDER — LIDOCAINE 2% (20 MG/ML) 5 ML SYRINGE
INTRAMUSCULAR | Status: DC | PRN
  Administered 2022-08-02: 60 mg via INTRAVENOUS

## 2022-08-02 SURGICAL SUPPLY — 29 items
BRUSH CYTOL CELLEBRITY 1.5X140 (MISCELLANEOUS) IMPLANT
CANISTER SUCT 3000ML PPV (MISCELLANEOUS) ×3 IMPLANT
CONT SPEC 4OZ CLIKSEAL STRL BL (MISCELLANEOUS) ×3 IMPLANT
COVER BACK TABLE 60X90IN (DRAPES) ×3 IMPLANT
COVER DOME SNAP 22 D (MISCELLANEOUS) ×3 IMPLANT
FORCEPS BIOP RJ4 1.8 (CUTTING FORCEPS) IMPLANT
GAUZE SPONGE 4X4 12PLY STRL (GAUZE/BANDAGES/DRESSINGS) ×3 IMPLANT
GLOVE BIO SURGEON STRL SZ7.5 (GLOVE) ×3 IMPLANT
GOWN STRL REUS W/ TWL LRG LVL3 (GOWN DISPOSABLE) ×3 IMPLANT
GOWN STRL REUS W/TWL LRG LVL3 (GOWN DISPOSABLE) ×2
KIT CLEAN ENDO COMPLIANCE (KITS) ×6 IMPLANT
KIT TURNOVER KIT B (KITS) ×3 IMPLANT
MARKER SKIN DUAL TIP RULER LAB (MISCELLANEOUS) ×3 IMPLANT
NDL EBUS SONO TIP PENTAX (NEEDLE) ×3 IMPLANT
NEEDLE EBUS SONO TIP PENTAX (NEEDLE) ×2 IMPLANT
NS IRRIG 1000ML POUR BTL (IV SOLUTION) ×3 IMPLANT
OIL SILICONE PENTAX (PARTS (SERVICE/REPAIRS)) ×3 IMPLANT
PAD ARMBOARD 7.5X6 YLW CONV (MISCELLANEOUS) ×6 IMPLANT
SOL ANTI FOG 6CC (MISCELLANEOUS) ×3 IMPLANT
SYR 20CC LL (SYRINGE) ×6 IMPLANT
SYR 20ML ECCENTRIC (SYRINGE) ×6 IMPLANT
SYR 50ML SLIP (SYRINGE) IMPLANT
SYR 5ML LUER SLIP (SYRINGE) ×3 IMPLANT
TOWEL OR 17X24 6PK STRL BLUE (TOWEL DISPOSABLE) ×3 IMPLANT
TRAP SPECIMEN MUCOUS 40CC (MISCELLANEOUS) IMPLANT
TUBE CONNECTING 20X1/4 (TUBING) ×6 IMPLANT
UNDERPAD 30X30 (UNDERPADS AND DIAPERS) ×3 IMPLANT
VALVE DISPOSABLE (MISCELLANEOUS) ×3 IMPLANT
WATER STERILE IRR 1000ML POUR (IV SOLUTION) ×3 IMPLANT

## 2022-08-02 NOTE — Evaluation (Addendum)
Physical Therapy Evaluation Patient Details Name: Karen [name] MRN: [id number] DOB: July 09, 1965 Today's Date: 08/02/2022  History of Present Illness  Pt is a 57 y.o. female admitted 2/9 with intractable N&V. PET scan showed hypermetabolic thoracic/low cervical adenopathy and pulmonary nodularity favoring concern for small cell cancer vs lymphoma. MRI negative for brain mets. PMH: latent TB, chronic abdominal pain, chronic back pain, depression, HTN, IBS, migraines   Clinical Impression  Pt admitted with above diagnosis. PTA pt lived in [address]rd floor apt with husband, active and independent. Pt currently with functional limitations due to the deficits listed below (see PT Problem List). On eval, pt demonstrated independence with bed mobility and transfers. Supervision with ambulation 300' without AD. Pt fatigues quickly. HR 86. SpO2 93% on RA. Pt will benefit from skilled PT to increase their independence and safety with mobility to allow discharge home. PT to follow acutely to further assess increased gait distances and stairs. No follow up services indicated.          Recommendations for follow up therapy are one component of a multi-disciplinary discharge planning process, led by the attending physician.  Recommendations may be updated based on patient status, additional functional criteria and insurance authorization.  Follow Up Recommendations No PT follow up      Assistance Recommended at Discharge None  Patient can return home with the following       Equipment Recommendations None recommended by PT  Recommendations for Other Services       Functional Status Assessment Patient has had a recent decline in their functional status and demonstrates the ability to make significant improvements in function in a reasonable and predictable amount of time.     Precautions / Restrictions Precautions Precautions: None      Mobility  Bed Mobility Overal bed mobility: Independent                   Transfers Overall transfer level: Independent Equipment used: None                    Ambulation/Gait Ambulation/Gait assistance: Supervision Gait Distance (Feet): 300 Feet Assistive device: None Gait Pattern/deviations: WFL(Within Functional Limits) Gait velocity: WFL Gait velocity interpretation: >4.37 ft/sec, indicative of normal walking speed   General Gait Details: steady gait. Fatigues quickly. SOB noted. HR 86. SpO2 93% on RA.  Stairs            Wheelchair Mobility    Modified Rankin (Stroke Patients Only)       Balance Overall balance assessment: No apparent balance deficits (not formally assessed)                                           Pertinent Vitals/Pain Pain Assessment Pain Assessment: Faces Faces Pain Scale: Hurts little more Pain Location: abdomen Pain Descriptors / Indicators: Discomfort, Grimacing Pain Intervention(s): Monitored during session, Repositioned    Home Living Family/patient expects to be discharged to:: Private residence Living Arrangements: Spouse/significant other Available Help at Discharge: Family Type of Home: Apartment Home Access: Stairs to enter Entrance Stairs-Rails: Right Entrance Stairs-Number of Steps: 2 flights (3rd floor apt)   Home Layout: One level Home Equipment: None      Prior Function Prior Level of Function : Independent/Modified Independent  Hand Dominance        Extremity/Trunk Assessment   Upper Extremity Assessment Upper Extremity Assessment: Overall WFL for tasks assessed    Lower Extremity Assessment Lower Extremity Assessment: Overall WFL for tasks assessed    Cervical / Trunk Assessment Cervical / Trunk Assessment: Normal  Communication   Communication: Prefers language other than [place] (Speaks basic English. Interpreter needed for medical terminology.)  Cognition Arousal/Alertness:  Awake/alert Behavior During Therapy: WFL for tasks assessed/performed Overall Cognitive Status: Within Functional Limits for tasks assessed                                          General Comments General comments (skin integrity, edema, etc.): Pt NPO at time of PT eval, awaiting procedure (EBUS).    Exercises     Assessment/Plan    PT Assessment Patient needs continued PT services  PT Problem List Decreased mobility;Decreased activity tolerance       PT Treatment Interventions Functional mobility training;Patient/family education;Gait training;Therapeutic activities;Stair training    PT Goals (Current goals can be found in the Care Plan section)       Frequency Min 3X/week     Co-evaluation               AM-PAC PT "6 Clicks" Mobility  Outcome Measure Help needed turning from your back to your side while in a flat bed without using bedrails?: None Help needed moving from lying on your back to sitting on the side of a flat bed without using bedrails?: None Help needed moving to and from a bed to a chair (including a wheelchair)?: None Help needed standing up from a chair using your arms (e.g., wheelchair or bedside chair)?: None Help needed to walk in hospital room?: A Little Help needed climbing 3-5 steps with a railing? : A Little 6 Click Score: 22    End of Session   Activity Tolerance: Patient tolerated treatment well Patient left: in bed;with call bell/phone within reach Nurse Communication: Mobility status PT Visit Diagnosis: Difficulty in walking, not elsewhere classified (R26.2)    Time: 6333-5456 PT Time Calculation (min) (ACUTE ONLY): 18 min   Charges:   PT Evaluation $PT Eval Low Complexity: 1 Low          Gloriann Loan., PT  Office # [phone]   Lorriane Shire 08/02/2022, 11:18 AM

## 2022-08-02 NOTE — Telephone Encounter (Signed)
Done. Prelim cytology consistent w malignancy

## 2022-08-02 NOTE — Progress Notes (Signed)
HD#3 Subjective:   Summary: Karen [name] is a 57 y.o. female with PMH of latent TB (reportedly treated in 2013), PUD, hypertriglyceridemia, migraine, lumbar pain w/ radiculopathy, onychomycosis, anxiety, depression, insomnia who presents with intractable nausea and vomiting, pain, poor intake, and dehydration directly admitted from [facility] 07/30/22 for management of these symptoms in the setting of suspected metastatic cancer identified in the outpatient setting, w/u underway.  Overnight Events:  N.p.o. for bronch today.    Having some vomiting this morning, has not noticed any blood. Having a mild headache but does not want any more pills/medicine.  She has associated photosensitivity and sensitivity to noise.  Headache feels similar to her prior migraines.  Denies chest pain, dyspnea, vision changes.  She was told by a doctor to come in because she was having a lot of weight loss. We discussed imaging findings and plan for bronchoscopy today. She has never been told she has TB and has never been treated for TB in the past. She has never smoked in the past.   Virtual spanish interpreter used during this encounter.   Objective:  Vital signs in last 24 hours: Vitals:   08/02/22 1203 08/02/22 1210 08/02/22 1220 08/02/22 1228  BP: (!) 167/82 126/65 132/68 137/79  Pulse: 90 96 89 92  Resp: 15 18 20 16   Temp: (!) 97.1 F (36.2 C)     TempSrc: Temporal     SpO2: 94% 97% 96% 96%  Weight:      Height:       Supplemental O2: Room Air   Physical Exam:  Constitutional: thin-appearing female sitting up in bed, in no acute distress HENT: normocephalic atraumatic Cardiovascular: RRR, no MRG Pulmonary/Chest: normal work of breathing on room air, CTAB Neurological: alert & oriented Skin: warm and dry  Filed Weights   08/02/22 1047  Weight: 49 kg     Intake/Output Summary (Last 24 hours) at 08/02/2022 1259 Last data filed at 08/02/2022 1146 Gross per 24 hour  Intake 700 ml   Output --  Net 700 ml   Net IO Since Admission: 2,300.47 mL [08/02/22 1259]  Pertinent Labs:    Latest Ref Rng & Units 08/01/2022    3:48 AM 07/31/2022    4:01 AM 07/30/2022   10:15 PM  CBC  WBC 4.0 - 10.5 K/uL 9.4  9.0  11.6   Hemoglobin 12.0 - 15.0 g/dL 11.0  11.6  13.3   Hematocrit 36.0 - 46.0 % 32.7  33.9  37.5   Platelets 150 - 400 K/uL 315  342  386        Latest Ref Rng & Units 08/01/2022    3:48 AM 07/31/2022    4:01 AM 07/30/2022   10:15 PM  CMP  Glucose 70 - 99 mg/dL 113  100  124   BUN 6 - 20 mg/dL 8  16  19    Creatinine 0.44 - 1.00 mg/dL 0.61  0.72  0.80   Sodium 135 - 145 mmol/L 136  134  135   Potassium 3.5 - 5.1 mmol/L 3.9  3.9  3.3   Chloride 98 - 111 mmol/L 103  99  100   CO2 22 - 32 mmol/L 26  25  22    Calcium 8.9 - 10.3 mg/dL 8.7  8.6  9.5   Total Protein 6.5 - 8.1 g/dL   7.3   Total Bilirubin 0.3 - 1.2 mg/dL   0.6   Alkaline Phos 38 - 126 U/L   104  AST 15 - 41 U/L   23   ALT 0 - 44 U/L   12     Imaging: No results found.  Assessment/Plan:   Principal Problem:   Malignancy ([place]) Active Problems:   TB lung, latent   Mediastinal adenopathy   Headache   Nausea and vomiting   Vocal cord paralysis  Patient Summary: Karen [name] is a 57 y.o. with PMH of latent TB (reportedly treated in 2013), PUD, hypertriglyceridemia, migraine, lumbar pain w/ radiculopathy, anxiety, depression who presents with intractable nausea and vomiting and admitted for dehydration.  Dehydration Decreased appetite  Nausea and vomiting  Some nausea and vomiting this morning. Denies further lightheadedness or dizziness. Still has headache. Continue supportive care.  -Can use Compazine for refractory vomiting and for intractable migraine if needed -encourage po intake, on regular diet -zofran PRN for nausea -continue workup listed below  Mediastinal lymphadenopathy Concern for malignancy Latent TB Vocal cord paralysis Has been worked up in outpatient for  mediastinal lymphadenopathy. Quantiferon gold positive 3 weeks ago. Last CT chest showed heterogenous enhancing nodular soft tissue density within mediastinum and hilum bilaterally w/ central necrosis within nodes. Pulmonology planned for bronchoscopy on 2/14 with tissue sampling. PET scan concern for metastatic malignancy small cell lung cancer vs lymphoma.  Brain MRI negative for brain mets. Bronchoscopy today preliminarily concerning for malignancy.  She will need heme-onc follow-up. -appreciate pulmonology assistance -Follow-up bronch studies -continue home Norco 5-325 mg 1-2 tabs q6h PRN, bowel regimen in place -air droplet and contact precautions  Headache Hx of migraine Patient does persistent migraine now with vomiting. Hx of migraine and patient states this headache is similar to prior headaches. Upon chart review, was on sumatriptan before so could trial and see if that helps.  -home Norco 5-325 mg 1-2 tabs q5h PRN  -scheduled tylenol 650 mg q6h -start sumatriptan 25 mg q2h PRN -Compazine 10, Benadryl 25, Tylenol 1000 for refractory migraine if needed  Anxiety Depression Insomnia Home meds include sertraline, klonopin and trazodone. Will continue her home meds.  -continue home sertraline 100 mg BID -continue home klonopin 0.5 mg BID -continue home trazodone 150 mg nightly   Diet: Normal IVF: None,None VTE: Enoxaparin Code: Full TOC recs: TBD Family Update: family not present at bedside today  Dispo: Anticipated discharge to Home pending medical stability and workup  Linus Galas MD Internal Medicine Resident PGY-1 Pager: [phone]  Please contact the on call pager after 5 pm and on weekends at [phone]
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
## 2022-08-02 NOTE — Transfer of Care (Signed)
Immediate Anesthesia Transfer of Care Note  Patient: Karen [name]  Procedure(s) Performed: VIDEO BRONCHOSCOPY WITH ENDOBRONCHIAL ULTRASOUND (Bilateral) BRONCHIAL WASHINGS VIDEO BRONCHOSCOPY WITHOUT FLUORO BRONCHIAL NEEDLE ASPIRATION BIOPSIES  Patient Location:  Pt recovering in endo 3  Anesthesia Type:General  Level of Consciousness: awake, alert , and oriented  Airway & Oxygen Therapy: Patient Spontanous Breathing and Patient connected to nasal cannula oxygen  Post-op Assessment: Report given to RN and Post -op Vital signs reviewed and stable  Post vital signs: Reviewed and stable  Last Vitals:  Vitals Value Taken Time  BP 167/82 08/02/22 1203  Temp 36.2 C 08/02/22 1203  Pulse 90 08/02/22 1203  Resp 15 08/02/22 1203  SpO2 94 % 08/02/22 1203    Last Pain:  Vitals:   08/02/22 1203  TempSrc: Temporal  PainSc:       Patients Stated Pain Goal: 0 (21/11/55 2080)  Complications: No notable events documented.

## 2022-08-02 NOTE — Anesthesia Procedure Notes (Signed)
Procedure Name: Intubation Date/Time: 08/02/2022 11:02 AM  Performed by: Wilburn Cornelia, CRNAPre-anesthesia Checklist: Patient identified, Emergency Drugs available, Suction available, Patient being monitored and Timeout performed Patient Re-evaluated:Patient Re-evaluated prior to induction Oxygen Delivery Method: Circle system utilized Preoxygenation: Pre-oxygenation with 100% oxygen Induction Type: IV induction, Rapid sequence and Cricoid Pressure applied Ventilation: Mask ventilation without difficulty Grade View: Grade I Tube type: Oral Tube size: 8.5 mm Number of attempts: 1 Airway Equipment and Method: Video-laryngoscopy and Rigid stylet Placement Confirmation: ETT inserted through vocal cords under direct vision, positive ETCO2, CO2 detector and breath sounds checked- equal and bilateral Secured at: 21 cm Tube secured with: Tape Dental Injury: Teeth and Oropharynx as per pre-operative assessment

## 2022-08-02 NOTE — Op Note (Signed)
Video Bronchoscopy with Endobronchial Ultrasound Procedure Note  Date of Operation: 08/02/2022  Pre-op Diagnosis: Mediastinal and hilar adenopathy, scattered pulmonary nodules  Post-op Diagnosis: Same  Surgeon: Baltazar Apo  Assistants: None  Anesthesia: General endotracheal anesthesia  Operation: Flexible video fiberoptic bronchoscopy with endobronchial ultrasound and biopsies.  Estimated Blood Loss: Minimal  Complications: None apparent  Indications and History: Karen [name] is a 57 y.o. 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  Found to have pulmonary nodules and mediastinal adenopathy on chest imaging.  These areas were hypermetabolic on PET scan done 07/29/2022 and endobronchial ultrasound recommended for tissue diagnosis.  The risks, benefits, complications, treatment options and expected outcomes were discussed with the patient.  The possibilities of pneumothorax, pneumonia, reaction to medication, pulmonary aspiration, perforation of a viscus, bleeding, failure to diagnose a condition and creating a complication requiring transfusion or operation were discussed with the patient who freely signed the consent.    Description of Procedure: The patient was examined in the preoperative area and history and data from the preprocedure consultation were reviewed. It was deemed appropriate to proceed.  The patient was taken to [facility] endoscopy room 3, identified as Karen [name] and the procedure verified as Flexible Video Fiberoptic Bronchoscopy.  A Time Out was held and the above information confirmed. After being taken to the operating room general anesthesia was initiated and the patient  was orally intubated. The video fiberoptic bronchoscope was introduced via the endotracheal tube and a general inspection was performed which showed diffuse edematous airways throughout.  There were no endobronchial lesions, no significant secretions.  BAL was performed in the anterior segment of the  right upper lobe with 60 cc normal saline instilled and approximately 20 cc of returned.  The standard scope was then withdrawn and the endobronchial ultrasound was used to identify and characterize the peritracheal, hilar and bronchial lymph nodes. Inspection showed multiple enlarged lymph nodes including at station 4L, 4R, 7, 10 R, 11 R. Using real-time ultrasound guidance Wang needle biopsies were take from Station 4L, 4R, 10 R, 11 R nodes and were sent for cytology. The patient tolerated the procedure well without apparent complications. There was no significant blood loss. The bronchoscope was withdrawn. Anesthesia was reversed and the patient was taken to the PACU for recovery.   Samples: 1. Wang needle biopsies from 4L node 2. Wang needle biopsies from 4R node 3. Wang needle biopsies from 10 R node 4. Wang needle biopsies from 11 R node 5.  Bronchoalveolar lavage from the anterior segment of the right upper lobe  Plans:  The patient will be discharged from the PACU to home when recovered from anesthesia. We will review the cytology, pathology and microbiology results with the patient when they become available. Outpatient followup will be with Dr Valeta Harms.    Baltazar Apo, MD, PhD 08/02/2022, 12:02 PM [facility] [phone] or if no answer before 7:00PM call [phone] For any issues after 7:00PM please call eLink [phone]

## 2022-08-02 NOTE — Anesthesia Preprocedure Evaluation (Addendum)
Anesthesia Evaluation  Patient identified by MRN, date of birth, ID band Patient awake    Reviewed: Allergy & Precautions, NPO status , Patient's Chart, lab work & pertinent test results, reviewed documented beta blocker date and time   Airway Mallampati: II       Dental  (+) Dental Advisory Given, Poor Dentition, Missing, Chipped   Pulmonary  Latent TB Mediastinal adenopathy Vocal cord paralysis   Pulmonary exam normal breath sounds clear to auscultation       Cardiovascular hypertension, Pt. on medications Normal cardiovascular exam Rhythm:Regular Rate:Normal     Neuro/Psych  Headaches PSYCHIATRIC DISORDERS Anxiety Depression    Chronic pain syndrome  Neuromuscular disease    GI/Hepatic Neg liver ROS, PUD,,,  Endo/Other  negative endocrine ROS    Renal/GU negative Renal ROS  negative genitourinary   Musculoskeletal Chronic back pain   Abdominal   Peds  Hematology  (+) Blood dyscrasia, anemia   Anesthesia Other Findings   Reproductive/Obstetrics                             Anesthesia Physical Anesthesia Plan  ASA: 3  Anesthesia Plan: General   Post-op Pain Management: Minimal or no pain anticipated   Induction: Intravenous  PONV Risk Score and Plan: 4 or greater and Treatment may vary due to age or medical condition, Midazolam, Ondansetron and Dexamethasone  Airway Management Planned: Oral ETT and Video Laryngoscope Planned  Additional Equipment: None  Intra-op Plan:   Post-operative Plan: Extubation in OR  Informed Consent: I have reviewed the patients History and Physical, chart, labs and discussed the procedure including the risks, benefits and alternatives for the proposed anesthesia with the patient or authorized representative who has indicated his/her understanding and acceptance.     Dental advisory given  Plan Discussed with: Anesthesiologist and  CRNA  Anesthesia Plan Comments:         Anesthesia Quick Evaluation

## 2022-08-02 NOTE — Anesthesia Postprocedure Evaluation (Signed)
Anesthesia Post Note  Patient: Karen [name]  Procedure(s) Performed: VIDEO BRONCHOSCOPY WITH ENDOBRONCHIAL ULTRASOUND (Bilateral) BRONCHIAL WASHINGS VIDEO BRONCHOSCOPY WITHOUT FLUORO BRONCHIAL NEEDLE ASPIRATION BIOPSIES     Patient location during evaluation: PACU Anesthesia Type: General Level of consciousness: awake and alert and oriented Pain management: pain level controlled Vital Signs Assessment: post-procedure vital signs reviewed and stable Respiratory status: spontaneous breathing, nonlabored ventilation and respiratory function stable Cardiovascular status: blood pressure returned to baseline and stable Postop Assessment: no apparent nausea or vomiting Anesthetic complications: no   No notable events documented.  Last Vitals:  Vitals:   08/02/22 1203 08/02/22 1210  BP: (!) 167/82 126/65  Pulse: 90 96  Resp: 15 18  Temp: (!) 36.2 C   SpO2: 94% 97%    Last Pain:  Vitals:   08/02/22 1210  TempSrc:   PainSc: 0-No pain                 Omair Dettmer A.

## 2022-08-02 NOTE — Plan of Care (Signed)

## 2022-08-02 NOTE — Interval H&P Note (Signed)
History and Physical Interval Note:  08/02/2022 10:50 AM  Karen [name]  has presented today for surgery, with the diagnosis of mediastinal adenopathy, history of Latent TB.  The various methods of treatment have been discussed with the patient and family. After consideration of risks, benefits and other options for treatment, the patient has consented to  Procedure(s) with comments: [place] (Bilateral) - scheduled for later in week but now inpatient - so try to do 08/02/22 as a surgical intervention.  The patient's history has been reviewed, patient examined, no change in status, stable for surgery.  I have reviewed the patient's chart and labs.  Questions were answered to the patient's satisfaction.     Collene Gobble

## 2022-08-03 DIAGNOSIS — R112 Nausea with vomiting, unspecified: Secondary | ICD-10-CM | POA: Diagnosis not present

## 2022-08-03 DIAGNOSIS — R9389 Abnormal findings on diagnostic imaging of other specified body structures: Secondary | ICD-10-CM

## 2022-08-03 DIAGNOSIS — R638 Other symptoms and signs concerning food and fluid intake: Secondary | ICD-10-CM | POA: Diagnosis not present

## 2022-08-03 DIAGNOSIS — C801 Malignant (primary) neoplasm, unspecified: Secondary | ICD-10-CM | POA: Diagnosis not present

## 2022-08-03 MED ORDER — PROCHLORPERAZINE EDISYLATE 10 MG/2ML IJ SOLN
10.0000 mg | Freq: Three times a day (TID) | INTRAMUSCULAR | Status: AC | PRN
  Administered 2022-08-03: 10 mg via INTRAVENOUS
  Filled 2022-08-03: qty 2

## 2022-08-03 MED ORDER — DIPHENHYDRAMINE HCL 50 MG/ML IJ SOLN
25.0000 mg | Freq: Once | INTRAMUSCULAR | Status: AC | PRN
  Administered 2022-08-03: 25 mg via INTRAVENOUS
  Filled 2022-08-03: qty 1

## 2022-08-03 MED ORDER — DIPHENHYDRAMINE HCL 50 MG/ML IJ SOLN
25.0000 mg | Freq: Three times a day (TID) | INTRAMUSCULAR | Status: AC | PRN
  Administered 2022-08-03: 25 mg via INTRAVENOUS
  Filled 2022-08-03: qty 1

## 2022-08-03 MED ORDER — PROCHLORPERAZINE EDISYLATE 10 MG/2ML IJ SOLN
10.0000 mg | Freq: Once | INTRAMUSCULAR | Status: AC | PRN
  Administered 2022-08-03: 10 mg via INTRAVENOUS
  Filled 2022-08-03: qty 2
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
## 2022-08-03 NOTE — Progress Notes (Signed)
HD#4 Subjective:   Summary: Karen [name] is a 57 y.o. female with PMH of latent TB (reportedly treated in 2013), PUD, hypertriglyceridemia, migraine, lumbar pain w/ radiculopathy, onychomycosis, anxiety, depression, insomnia who presents with intractable nausea and vomiting, pain, poor intake, and dehydration directly admitted from [facility] 07/30/22 for management of these symptoms in the setting of suspected metastatic cancer identified in the outpatient setting, w/u underway.  Overnight Events:  Bronchoscopy done yesterday.  Contacted by Dr. Lamonte Sakai, preliminary cytology is consistent with malignancy.  Discussed with patient yesterday afternoon and answered all questions.  Contacted heme-onc, recommending outpatient follow-up later this week.  She is having sore throat after bronchoscopy, started on viscous lidocaine yesterday, needed to have pills crushed overnight.  Discussed bronc results again with Dr. Lamonte Sakai in the room.  She likely does not have any active TB infection and preliminary smears are consistent with malignancy, though unsure exactly what.  She is still endorsing sore throat which is somewhat improved with viscous lidocaine.  She was able to take in some p.o. intake for breakfast this morning, though did have emesis without any hematemesis or hemoptysis.  She is still having migraine.  Discussed possibility of discharge later today depending on her p.o. intake and symptomatic relief, and she stated understanding.  Update: Reassessed this afternoon and she only tolerated minimal p.o. intake and vomiting/headaches and had not improved.  She got migraine cocktail at time of assessment.  She did not feel ready to go home this evening, discussed further symptomatic treatment and she can likely discharge tomorrow.  She is amenable with this plan.  Discussed outpatient follow-up including with heme-onc and she understands at this time.  Virtual spanish interpreter used during this  encounter.   Objective:  Vital signs in last 24 hours: Vitals:   08/02/22 1228 08/02/22 1400 08/02/22 2058 08/03/22 0524  BP: 137/79 125/68 128/62 115/63  Pulse: 92 80 83 85  Resp: 16 17 14 20   Temp:   98.2 F (36.8 C) 98.7 F (37.1 C)  TempSrc:   Oral Oral  SpO2: 96% 96% 95%   Weight:      Height:       Supplemental O2: Room Air   Physical Exam:  Constitutional: thin-appearing female sitting up in bed, in no acute distress HENT: normocephalic atraumatic Cardiovascular: RRR, no MRG Pulmonary/Chest: normal work of breathing on room air, CTAB Neurological: alert & oriented Skin: warm and dry  Filed Weights   08/02/22 1047  Weight: 49 kg     Intake/Output Summary (Last 24 hours) at 08/03/2022 0754 Last data filed at 08/03/2022 0500 Gross per 24 hour  Intake 920 ml  Output --  Net 920 ml    Net IO Since Admission: 2,520.47 mL [08/03/22 0754]  Pertinent Labs:    Latest Ref Rng & Units 08/01/2022    3:48 AM 07/31/2022    4:01 AM 07/30/2022   10:15 PM  CBC  WBC 4.0 - 10.5 K/uL 9.4  9.0  11.6   Hemoglobin 12.0 - 15.0 g/dL 11.0  11.6  13.3   Hematocrit 36.0 - 46.0 % 32.7  33.9  37.5   Platelets 150 - 400 K/uL 315  342  386        Latest Ref Rng & Units 08/01/2022    3:48 AM 07/31/2022    4:01 AM 07/30/2022   10:15 PM  CMP  Glucose 70 - 99 mg/dL 113  100  124   BUN 6 - 20 mg/dL  8  16  19    Creatinine 0.44 - 1.00 mg/dL 0.61  0.72  0.80   Sodium 135 - 145 mmol/L 136  134  135   Potassium 3.5 - 5.1 mmol/L 3.9  3.9  3.3   Chloride 98 - 111 mmol/L 103  99  100   CO2 22 - 32 mmol/L 26  25  22    Calcium 8.9 - 10.3 mg/dL 8.7  8.6  9.5   Total Protein 6.5 - 8.1 g/dL   7.3   Total Bilirubin 0.3 - 1.2 mg/dL   0.6   Alkaline Phos 38 - 126 U/L   104   AST 15 - 41 U/L   23   ALT 0 - 44 U/L   12     Imaging: No results found.  Assessment/Plan:   Principal Problem:   Malignancy ([place]) Active Problems:   TB lung, latent   Mediastinal adenopathy   Headache   Nausea  and vomiting   Vocal cord paralysis  Patient Summary: Karen [name] is a 57 y.o. with PMH of latent TB (reportedly treated in 2013), PUD, hypertriglyceridemia, migraine, lumbar pain w/ radiculopathy, anxiety, depression who presents with intractable nausea and vomiting and admitted for dehydration.  Dehydration Decreased appetite  Nausea and vomiting  Some nausea and vomiting this morning. Denies further lightheadedness or dizziness. Still has headache.  Migraine cocktail with Compazine, Benadryl given this afternoon.  Can repeat as needed. -Can use Compazine for refractory vomiting and for intractable migraine if needed -encourage po intake, on regular diet -zofran PRN for nausea -continue workup listed below  Mediastinal lymphadenopathy Concern for malignancy Latent TB Vocal cord paralysis Has been worked up in outpatient for mediastinal lymphadenopathy. Quantiferon gold positive 3 weeks ago. Last CT chest showed heterogenous enhancing nodular soft tissue density within mediastinum and hilum bilaterally w/ central necrosis within nodes. Pulmonology planned for bronchoscopy on 2/14 with tissue sampling. PET scan concern for metastatic malignancy small cell lung cancer vs lymphoma.  Brain MRI negative for brain mets. Bronchoscopy preliminarily concerning for malignancy.  Heme-onc follow-up arranged for 2/15. -appreciate pulmonology assistance -Follow-up bronch studies -continue home Norco 5-325 mg 1-2 tabs q6h PRN, bowel regimen in place -air droplet and contact precautions  Headache Hx of migraine Patient does persistent migraine now with vomiting. Hx of migraine and patient states this headache is similar to prior headaches. Upon chart review, was on sumatriptan before so initially trialed that with some improvement but not complete relief.  Added migraine cocktail, can repeat if it helps every 8 hours. -home Norco 5-325 mg 1-2 tabs q5h PRN  -scheduled tylenol 650 mg  q6h -start sumatriptan 25 mg q2h PRN -Compazine 10, Benadryl 25, Tylenol 1000 every 8 hours for refractory migraine if needed  Anxiety Depression Insomnia Home meds include sertraline, klonopin and trazodone. Will continue her home meds.  -continue home sertraline 100 mg BID -continue home klonopin 0.5 mg BID -continue home trazodone 150 mg nightly   Diet: Normal IVF: None,None VTE: Enoxaparin Code: Full TOC recs: TBD Family Update: family not present at bedside today  Dispo: Anticipated discharge to Home pending medical stability likely tomorrow.  Linus Galas MD Internal Medicine Resident PGY-1 Pager: [phone]  Please contact the on call pager after 5 pm and on weekends at [phone]

## 2022-08-03 NOTE — Plan of Care (Signed)

## 2022-08-03 NOTE — Progress Notes (Signed)
   NAME:  Karen [name], MRN:  [id number], DOB:  08-24-1965, LOS: 4 ADMISSION DATE:  07/30/2022, CONSULTATION DATE:  07/31/22 REFERRING MD:  Posey Pronto, CHIEF COMPLAINT:  abnormal CT chest    Interval Events: -Underwent bronchoscopy with EBUS on 2/12, preliminary cytology is concerning for malignancy, final still pending -Has continued to have nausea, requiring antiemetics -Persistent cough, upper airway irritation post procedure on 2/12  Vitals:   08/03/22 0524 08/03/22 0857  BP: 115/63 (!) 118/50  Pulse: 85 77  Resp: 20 17  Temp: 98.7 F (37.1 C) 98.3 F (36.8 C)  SpO2:  96%   Gen: Pleasant, well-nourished, in no distress,  normal affect  ENT: hoarse voice  Neck: No JVD, no stridor  Lungs: No use of accessory muscles, no crackles or wheezing on normal respiration, no wheeze on forced expiration  Cardiovascular: RRR, heart sounds normal, no murmur or gallops, no peripheral edema  Abdomen: soft and NT, no HSM,  BS normal  Musculoskeletal: No deformities, no cyanosis or clubbing  Neuro: alert, awake, non focal  Skin: Warm, no lesions or rashes   Impression / Plans: Mediastinal and hilar adenopathy.  With history of positive QuantiFERON gold there was concern for possible TB with nodal involvement.  Now post EBUS, cytology much more consistent with a malignant process.  The AFB smears and cultures are still pending but suspect that they will be negative.  Explained to her today that presentation and biopsy findings are suggestive of malignancy, final pathology will likely be available before the end of this week.  Have recommended urgent oncology evaluation as an outpatient or alternatively as an inpatient if she is to remain admitted for any extended time.  If her AFB smear is negative then I would be comfortable taking her off TB airborne isolation precautions.  Baltazar Apo, MD, PhD 08/03/2022, 10:39 AM [facility] [phone] or if no answer  before 7:00PM call [phone] For any issues after 7:00PM please call eLink [phone]

## 2022-08-04 ENCOUNTER — Encounter (HOSPITAL_COMMUNITY): Payer: Self-pay | Admitting: Emergency Medicine

## 2022-08-04 ENCOUNTER — Ambulatory Visit: Payer: Commercial Managed Care - HMO | Admitting: Speech Pathology

## 2022-08-04 ENCOUNTER — Encounter (HOSPITAL_COMMUNITY): Admission: RE | Payer: Self-pay | Source: Home / Self Care

## 2022-08-04 ENCOUNTER — Ambulatory Visit (HOSPITAL_COMMUNITY)
Admission: RE | Admit: 2022-08-04 | Payer: Commercial Managed Care - HMO | Source: Home / Self Care | Admitting: Pulmonary Disease

## 2022-08-04 ENCOUNTER — Other Ambulatory Visit (HOSPITAL_COMMUNITY): Payer: Self-pay

## 2022-08-04 LAB — CULTURE, BAL-QUANTITATIVE W GRAM STAIN
Culture: 30000 — AB
Gram Stain: NONE SEEN

## 2022-08-04 SURGERY — BRONCHOSCOPY, WITH EBUS
Anesthesia: General

## 2022-08-04 MED ORDER — ONDANSETRON 8 MG PO TBDP
8.0000 mg | ORAL_TABLET | Freq: Three times a day (TID) | ORAL | 0 refills | Status: DC | PRN
  Filled 2022-08-04: qty 30, 10d supply, fill #0

## 2022-08-04 MED ORDER — PROCHLORPERAZINE MALEATE 10 MG PO TABS
10.0000 mg | ORAL_TABLET | Freq: Four times a day (QID) | ORAL | 0 refills | Status: DC | PRN
  Filled 2022-08-04: qty 10, 3d supply, fill #0

## 2022-08-04 MED ORDER — SUMATRIPTAN SUCCINATE 50 MG PO TABS
50.0000 mg | ORAL_TABLET | Freq: Once | ORAL | 0 refills | Status: DC | PRN
  Filled 2022-08-04: qty 9, 30d supply, fill #0

## 2022-08-04 MED ORDER — DICLOFENAC SODIUM 1 % EX GEL
4.0000 g | Freq: Four times a day (QID) | CUTANEOUS | 0 refills | Status: DC
  Filled 2022-08-04: qty 100, fill #0

## 2022-08-04 MED ORDER — LIDOCAINE VISCOUS HCL 2 % MT SOLN
15.0000 mL | OROMUCOSAL | 0 refills | Status: DC | PRN
  Filled 2022-08-04: qty 100, 2d supply, fill #0

## 2022-08-04 MED ORDER — PROPRANOLOL HCL 40 MG PO TABS
40.0000 mg | ORAL_TABLET | Freq: Two times a day (BID) | ORAL | 0 refills | Status: DC
  Filled 2022-08-04: qty 60, 30d supply, fill #0

## 2022-08-04 NOTE — Progress Notes (Signed)
Patient is ready for discharge AVS instructions given and IV is removed at this time. She is currently waiting on her husband he will be here in an hour and she is also waiting on medications from the pharmacy.

## 2022-08-04 NOTE — Progress Notes (Signed)
93 Min on TOC medications

## 2022-08-04 NOTE — Progress Notes (Incomplete)
HD#5 Subjective:   Summary: Karen [name] is a 57 y.o. female with PMH of latent TB (reportedly treated in 2013), PUD, hypertriglyceridemia, migraine, lumbar pain w/ radiculopathy, onychomycosis, anxiety, depression, insomnia who presents with intractable nausea and vomiting, pain, poor intake, and dehydration directly admitted from [facility] 07/30/22 for management of these symptoms in the setting of suspected metastatic cancer identified in the outpatient setting, w/u underway.  Overnight Events:  Migraine cocktail given 2x yesterday. NAEON.    Virtual spanish interpreter used during this encounter.   Objective:  Vital signs in last 24 hours: Vitals:   08/03/22 0857 08/03/22 1430 08/03/22 2230 08/04/22 0512  BP: (!) 118/50 117/63 115/63 109/69  Pulse: 77 76 80 76  Resp: 17 17 14 18   Temp: 98.3 F (36.8 C) 98.5 F (36.9 C) 98.7 F (37.1 C) 97.9 F (36.6 C)  TempSrc: Oral Oral Oral Oral  SpO2: 96% 96% 98%   Weight:      Height:       Supplemental O2: Room Air   Physical Exam:  Constitutional: thin-appearing female sitting up in bed, in no acute distress HENT: normocephalic atraumatic Cardiovascular: RRR, no MRG Pulmonary/Chest: normal work of breathing on room air, CTAB Neurological: alert & oriented Skin: warm and dry  Filed Weights   08/02/22 1047  Weight: 49 kg    No intake or output data in the 24 hours ending 08/04/22 0629  Net IO Since Admission: 2,520.47 mL [08/04/22 0629]  Pertinent Labs:    Latest Ref Rng & Units 08/01/2022    3:48 AM 07/31/2022    4:01 AM 07/30/2022   10:15 PM  CBC  WBC 4.0 - 10.5 K/uL 9.4  9.0  11.6   Hemoglobin 12.0 - 15.0 g/dL 11.0  11.6  13.3   Hematocrit 36.0 - 46.0 % 32.7  33.9  37.5   Platelets 150 - 400 K/uL 315  342  386        Latest Ref Rng & Units 08/01/2022    3:48 AM 07/31/2022    4:01 AM 07/30/2022   10:15 PM  CMP  Glucose 70 - 99 mg/dL 113  100  124   BUN 6 - 20 mg/dL 8  16  19    Creatinine 0.44 - 1.00  mg/dL 0.61  0.72  0.80   Sodium 135 - 145 mmol/L 136  134  135   Potassium 3.5 - 5.1 mmol/L 3.9  3.9  3.3   Chloride 98 - 111 mmol/L 103  99  100   CO2 22 - 32 mmol/L 26  25  22    Calcium 8.9 - 10.3 mg/dL 8.7  8.6  9.5   Total Protein 6.5 - 8.1 g/dL   7.3   Total Bilirubin 0.3 - 1.2 mg/dL   0.6   Alkaline Phos 38 - 126 U/L   104   AST 15 - 41 U/L   23   ALT 0 - 44 U/L   12     Imaging: No results found.  Assessment/Plan:   Principal Problem:   Malignancy ([place]) Active Problems:   TB lung, latent   Mediastinal adenopathy   Headache   Nausea and vomiting   Vocal cord paralysis  Patient Summary: Karen [name] is a 57 y.o. with PMH of latent TB (reportedly treated in 2013), PUD, hypertriglyceridemia, migraine, lumbar pain w/ radiculopathy, anxiety, depression who presents with intractable nausea and vomiting and admitted for dehydration.  Dehydration Decreased appetite  Nausea and vomiting  Some nausea and vomiting this morning. Denies further lightheadedness or dizziness. Still has headache.  Migraine cocktail with Compazine, Benadryl given this afternoon.  Can repeat as needed. -Can use Compazine for refractory vomiting and for intractable migraine if needed -encourage po intake, on regular diet -zofran PRN for nausea -continue workup listed below  Mediastinal lymphadenopathy Concern for malignancy Latent TB Vocal cord paralysis Has been worked up in outpatient for mediastinal lymphadenopathy. Quantiferon gold positive 3 weeks ago. Last CT chest showed heterogenous enhancing nodular soft tissue density within mediastinum and hilum bilaterally w/ central necrosis within nodes. Pulmonology planned for bronchoscopy on 2/14 with tissue sampling. PET scan concern for metastatic malignancy small cell lung cancer vs lymphoma.  Brain MRI negative for brain mets. Bronchoscopy preliminarily concerning for malignancy.  Heme-onc follow-up arranged for 2/15. -appreciate  pulmonology assistance -Follow-up bronch studies -continue home Norco 5-325 mg 1-2 tabs q6h PRN, bowel regimen in place -air droplet and contact precautions  Headache Hx of migraine Patient does persistent migraine now with vomiting. Hx of migraine and patient states this headache is similar to prior headaches. Upon chart review, was on sumatriptan before so initially trialed that with some improvement but not complete relief.  Added migraine cocktail, can repeat if it helps every 8 hours. -home Norco 5-325 mg 1-2 tabs q5h PRN  -scheduled tylenol 650 mg q6h -start sumatriptan 25 mg q2h PRN -Compazine 10, Benadryl 25, Tylenol 1000 every 8 hours for refractory migraine if needed  Anxiety Depression Insomnia Home meds include sertraline, klonopin and trazodone. Will continue her home meds.  -continue home sertraline 100 mg BID -continue home klonopin 0.5 mg BID -continue home trazodone 150 mg nightly   Diet: Normal IVF: None,None VTE: Enoxaparin Code: Full TOC recs: TBD Family Update: family not present at bedside today  Dispo: Anticipated discharge to Home pending medical stability likely tomorrow.  Linus Galas MD Internal Medicine Resident PGY-1 Pager: [phone]  Please contact the on call pager after 5 pm and on weekends at [phone]

## 2022-08-04 NOTE — Plan of Care (Signed)

## 2022-08-04 NOTE — Discharge Summary (Signed)
Name: Karen [name] MRN: [id number] DOB: 05-01-1966 57 y.o. PCP: Starlyn Skeans, MD  Date of Admission: 07/30/2022  6:46 PM Date of Discharge: 08/04/2022 Attending Physician: Dr. Dareen Piano  Discharge Diagnosis: Principal Problem:   Malignancy [facility]) Active Problems:   TB lung, latent   Mediastinal adenopathy   Headache   Nausea and vomiting   Vocal cord paralysis    Discharge Medications: Allergies as of 08/04/2022       Reactions   Percocet [oxycodone-acetaminophen]    Hydromorphone Hcl Itching   Oxycodone-acetaminophen Itching   Patient can tolerate acetaminophen        Medication List     STOP taking these medications    buPROPion 75 MG tablet Commonly known as: WELLBUTRIN   ketorolac 10 MG tablet Commonly known as: TORADOL   metroNIDAZOLE 500 MG tablet Commonly known as: FLAGYL   promethazine 12.5 MG tablet Commonly known as: PHENERGAN       TAKE these medications    albuterol 108 (90 Base) MCG/ACT inhaler Commonly known as: VENTOLIN HFA Inhale 2 puffs into the lungs every 6 (six) hours as needed for wheezing or shortness of breath.   clonazePAM 0.5 MG tablet Commonly known as: KLONOPIN TAKE 1 TABLET (0.5 MG TOTAL) BY MOUTH DAILY AS NEEDED FOR ANXIETY.   diclofenac Sodium 1 % Gel Commonly known as: Voltaren Apply 4 g topically 4 (four) times daily.   HYDROcodone-acetaminophen 5-325 MG tablet Commonly known as: NORCO/VICODIN Take 1-2 tablets by mouth every 6 (six) hours as needed for moderate pain or severe pain. Do not drive after taking   lidocaine 2 % solution Commonly known as: XYLOCAINE Use as directed 15 mLs in the mouth or throat every 4 (four) hours as needed for mouth pain (sore throat).   ondansetron 8 MG disintegrating tablet Commonly known as: ZOFRAN-ODT Take 1 tablet (8 mg total) by mouth every 8 (eight) hours as needed for nausea or vomiting.   prochlorperazine 10 MG tablet Commonly known as: COMPAZINE Take 1 tablet  (10 mg total) by mouth every 6 (six) hours as needed for refractory nausea / vomiting.   propranolol 40 MG tablet Commonly known as: INDERAL Take 1 tablet (40 mg total) by mouth 2 (two) times daily.   sertraline 100 MG tablet Commonly known as: ZOLOFT Take 100 mg by mouth 2 (two) times daily.   SUMAtriptan 50 MG tablet Commonly known as: Imitrex Take 1 tablet (50 mg total) by mouth once as needed for migraine. May repeat in 2 hours if headache persists or recurs.   traZODone 100 MG tablet Commonly known as: DESYREL Take 150 mg by mouth at bedtime as needed.        Disposition and follow-up:   Karen [name] was discharged from [facility] in Stable condition.  At the hospital follow up visit please address:  1.  Follow-up:  a. Please make sure she was able to follow up with heme-onc and has a follow up plan.    b.Propanolol 40 BID started for migraine ppx. Sumatriptan 50 for abortive tx. Referral to headache clinic placed, please adjust as needed.   c. Prescribed Zofran 8q8 for nausea, compazine 10 for refractory n/v and also helps with migraines. Qtc was not prolonged during inpatient stay, would reassess outpatient if she has been taking a lot of these medicines. Please readjust nausea regimen as needed   d. Started viscous lidocaine for sore throat following bronch, voltaren gel for chest wall pain, please address  pain symptoms as needed.  2.  Labs / imaging needed at time of follow-up: consider EKG to follow QTc.  BMP if she has been vomiting a lot.  3.  Pending labs/ test needing follow-up: EBUS biopsy cytology and fungal/AFB/AF culture results  Follow-up Appointments:  Follow-up Information     [name], Karen Desanctis, MD Follow up.   Contact information: [place] [address] [phone]                 Hospital Course by problem list: Karen [name] is a 57 y.o. with PMH of latent TB (reportedly treated in 2013),  PUD, hypertriglyceridemia, migraine, lumbar pain w/ radiculopathy, anxiety, depression who presents with intractable nausea and vomiting and admitted for dehydration and workup for possible malignancy/TB.   Dehydration Decreased appetite  Nausea and vomiting  Patient presents as a direct admit from clinic. Clinic team was concerned that patient was not hydrating well, and having poor p.o. intake in the outpatient setting. Patient has had decreased appetite, and has had having weight loss.  She has had ongoing outpatient workup with pulmonology for TB versus malignancy.  After admission, she was initially fluid resuscitated and on maintenance fluids until she was able to tolerate p.o. intake.  She required Zofran 8 every 8 as well as Compazine to control nausea.  P.o. intake improved over course of admission, but she did have some sore throat after bronchoscopy.  She is still able to tolerate p.o. intake with assistance of viscous lidocaine on day of discharge.  Advised to initially start with liquid diet and soft diet and then advance as tolerated.  Appetite still not significantly improved, but believe this is likely because of likely underlying malignancy which was preliminarily diagnosed with EBUS biopsy.  She will follow-up with heme-onc for this.  She will be discharged with course of Zofran for nausea and Compazine for refractory nausea and vomiting as well as viscous lidocaine for sore throat.  She has close follow-up with our clinic as well as heme-onc.   Mediastinal lymphadenopathy Concern for malignancy Latent TB Vocal cord paralysis Has been worked up in outpatient setting for mediastinal lymphadenopathy. Quantiferon gold positive 3 weeks ago. Last CT chest showed heterogenous enhancing nodular soft tissue density within mediastinum and hilum bilaterally w/ central necrosis within nodes.  PET scan also resulted on day of admission showing concern for metastatic malignancy possibly small cell  lung cancer versus lymphoma.  Brain MRI was obtained because of recurrent headaches which was negative for brain mets.  EBUS guided biopsy performed on 2/12 and results preliminarily concerning for malignancy, cytology and acid-fast culture and test still pending. Heme-onc follow-up arranged for 2/15 and she is aware of this.  She will be discharged with symptomatic nausea and pain management.  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  Checked in with PCCM regarding possible TB and they do not think she needs isolation at this time, but can follow-up final AFB results in the outpatient setting.  We will follow-up in clinic and support as needed.   Headache Hx of migraine Patient endorsing daily migraine during admission. Upon chart review, was on sumatriptan before so initially trialed that with some improvement but not complete relief.  Added migraine cocktail with Compazine 10, Benadryl 25 IV which provided significant relief.  She will be discharged with some Compazine for refractory nausea and vomiting, but for migraine given the frequency of her episodes will start prophylaxis with propranolol 40 mg twice  daily and she will also be discharged with course of sumatriptan and 50 mg as needed for abortive therapy and instructions to use times 2 after 2 hours if initial dose does not provide sufficient relief.  She will also be referred to headache clinic for further management.   Anxiety Depression Insomnia Home meds include sertraline, klonopin and trazodone continued during admission.  Subjective: Still having some pain in her throat. Still having a little vomiting, last episode this morning. Discussed that pain is likely from bronchoscopy and her vomiting. She has not vomited any blood. Having a little headache, but better. She has been able to drink fluids. She is agreeable to going home today. Discussed oncology visit tomorrow morning.  She states understanding.  Suggested  that she try liquid and soft diet for the next few days until her sore throat improves.  She acknowledges follow-up with heme-onc and with our clinic.  Discharge Vitals:   BP 107/70 (BP Location: Left Arm)   Pulse 78   Temp 98.4 F (36.9 C) (Oral)   Resp 20   Ht 5\' 2"  (1.575 m)   Wt 49 kg   LMP 02/08/2006   SpO2 98%   BMI 19.75 kg/m  Discharge exam: Constitutional: thin-appearing female sitting up in bed, in no acute distress HENT: normocephalic atraumatic Cardiovascular: RRR, no MRG Pulmonary/Chest: normal work of breathing on room air, CTAB, TTP over chest wall Neurological: alert & oriented x 4 Skin: warm and dry   Pertinent Labs, Studies, and Procedures:     Latest Ref Rng & Units 08/01/2022    3:48 AM 07/31/2022    4:01 AM 07/30/2022   10:15 PM  CBC  WBC 4.0 - 10.5 K/uL 9.4  9.0  11.6   Hemoglobin 12.0 - 15.0 g/dL 11.0  11.6  13.3   Hematocrit 36.0 - 46.0 % 32.7  33.9  37.5   Platelets 150 - 400 K/uL 315  342  386        Latest Ref Rng & Units 08/01/2022    3:48 AM 07/31/2022    4:01 AM 07/30/2022   10:15 PM  CMP  Glucose 70 - 99 mg/dL 113  100  124   BUN 6 - 20 mg/dL 8  16  19    Creatinine 0.44 - 1.00 mg/dL 0.61  0.72  0.80   Sodium 135 - 145 mmol/L 136  134  135   Potassium 3.5 - 5.1 mmol/L 3.9  3.9  3.3   Chloride 98 - 111 mmol/L 103  99  100   CO2 22 - 32 mmol/L 26  25  22    Calcium 8.9 - 10.3 mg/dL 8.7  8.6  9.5   Total Protein 6.5 - 8.1 g/dL   7.3   Total Bilirubin 0.3 - 1.2 mg/dL   0.6   Alkaline Phos 38 - 126 U/L   104   AST 15 - 41 U/L   23   ALT 0 - 44 U/L   12     MR BRAIN W WO CONTRAST  Result Date: 08/01/2022 CLINICAL DATA:  Initial evaluation for headache, evaluate for metastatic disease. EXAM: MRI HEAD WITHOUT AND WITH CONTRAST TECHNIQUE: Multiplanar, multiecho pulse sequences of the brain and surrounding structures were obtained without and with intravenous contrast. CONTRAST:  37mL GADAVIST GADOBUTROL 1 MMOL/ML IV SOLN COMPARISON:  Prior head CT  from 07/01/2022. FINDINGS: Brain: Cerebral volume within normal limits. Scattered patchy T2/FLAIR hyperintensity involving the supratentorial cerebral white matter, nonspecific, but overall mild  for age. No evidence for acute or subacute ischemia. Gray-white matter differentiation maintained. No areas of chronic cortical infarction. No acute or chronic intracranial blood products. No mass lesion, midline shift or mass effect. Ventricles normal size without hydrocephalus. Pituitary gland and suprasellar region normal. No abnormal enhancement or evidence for metastatic disease. Vascular: Major intracranial vascular flow voids are maintained. Skull and upper cervical spine: Craniocervical junction within normal limits. Bone marrow signal intensity normal. No focal marrow replacing lesion. No scalp soft tissue abnormality. Sinuses/Orbits: Globes orbital soft tissues within normal limits. Paranasal sinuses are largely clear. Trace right mastoid effusion, of doubtful significance. Other: None. IMPRESSION: 1. No acute intracranial abnormality. No evidence for intracranial metastatic disease. 2. Mild patchy T2/FLAIR hyperintensity involving the supratentorial cerebral white matter, nonspecific, but most commonly related to chronic microvascular ischemic disease. Electronically Signed   By: Jeannine Boga M.D.   On: 08/01/2022 01:54     Discharge Instructions: Discharge Instructions     AMB referral to headache clinic   Complete by: As directed    Diet - low sodium heart healthy   Complete by: As directed    Discharge instructions   Complete by: As directed    1.  Please make sure to follow-up with the [place] [facility].  Address is [address] [place], [place], [address].  Your appointment is tomorrow, 08/05/22 at 10 AM. 2.  You also an appointment with our internal medicine clinic on 08/10/2022 at 9:45 AM. 3.  You can use Voltaren gel on your chest for your chest pain.   Please also continue taking viscous lidocaine as needed for sore throat, I would recommend at least taking it before every meal and try initially eating liquid or soft foods for the next few days until your sore throat improves.  Please ask [place] for refills as needed during your upcoming clinic appointment with [place]. 3.  For your migraines, please start taking propranolol 40 mg twice a day which should help prevent migraines.  If you do start having a migraine, try sumatriptan 50 mg.  If the first dose does not work, take another dose after 2 hours.  We will be referring you to a headache clinic and they can further help manage your migraines. 4.  You can take Zofran 8 mg as needed for nausea every 8 hours.  If you are still feeling nauseous or have vomiting after taking a dose of Zofran, you can also take a dose of Compazine 10 mg.  Please let us know if you need refills during your upcoming clinic visit.  If you have worsening chest pain, trouble breathing, severe weakness or fatigue, please come back to the ED to be evaluated.   Increase activity slowly   Complete by: As directed          Discharge Instructions      High Calorie, High Protein Nutrition Therapy  A high-calorie, high-protein diet has been recommended to you. Your registered dietitian nutritionist (RDN) may have recommended this diet because you are having difficulty eating enough calories throughout the day, you have lost weight, and/or you need to add protein to your diet.  Sometimes you may not feel like eating, even if you know the importance of good nutrition. The recommendations in this handout can help you with the following:  Regaining your strength and energy Keeping your body healthy Healing and recovering from surgery or illness and fighting infection  Schedule Your Meals and Snacks  Several small meals and snacks are often better tolerated and digested than large meals.  Strategies  Plan to eat 3 meals and 3  snacks daily. Experiment with timing meals to find out when you have a larger appetite. Appetite may be greatest in the morning after not eating all night so you may prefer to eat your larger meals and snacks in the morning and at lunch. Breakfast-type foods are often better tolerated so eat foods such as eggs, pancakes, waffles and cereal for any meal or snack. Carry snacks with you so you are prepared to eat every 2 to 3 hours. Determine what works best for you if your body's cues for feeling hungry or full are not working. Eat a small meal or snack even if you don't feel hungry. Set a timer to remind you when it is time to eat. Take a walk before you eat (with health care provider's approval). Light or moderate physical activity can help you maintain muscle and increase your appetite.  Make Eating Enjoyable  Taking steps to make the experience enjoyable may help to increase your interest in eating and improve your appetite.  Strategies:  Eat with others whenever possible. Include your favorite foods to make meals more enjoyable. Try new foods. Save your beverage for the end of the meal so that you have more room for food before you get full.  Add Calories to Your Meals and Snacks  Try adding calorie-dense foods so that each bite provides more nutrition.  Strategies  Drink milk, chocolate milk, soy milk, or smoothies instead of low-calorie beverages such as diet drinks or water. Cook with milk or soy milk instead of water when making dishes such as hot cereal, cocoa, or pudding. Add jelly, jam, honey, butter or margarine to bread and crackers. Add jam or fruit to ice cream and as a topping over cake. Mix dried fruit, nuts, granola, honey, or dry cereal with yogurt or hot cereals. Enjoy snacks such as milkshakes, smoothies, pudding, ice cream, or custard. Blend a fruit smoothie of a banana, frozen berries, milk or soy milk, and 1 tablespoon nonfat powdered milk or protein  powder.  Add Protein to Your Meals and Snacks  Choose at least one protein food at each meal and snack to increase your daily intake.  Strategies  Add  cup nonfat dry milk powder or protein powder to make a high-protein milk to drink or to use in recipes that call for milk. Vanilla or peppermint extract or unsweetened cocoa powder could help to boost the flavor. Add hard-cooked eggs, leftover meat, grated cheese, canned beans or tofu to noodles, rice, salads, sandwiches, soups, casseroles, pasta, tuna and other mixed dishes. Add powdered milk or protein powder to hot cereals, meatloaf, casseroles, scrambled eggs, sauces, cream soups, and shakes. Add beans and lentils to salads, soups, casseroles, and vegetable dishes. Eat cottage cheese or yogurt, especially Greek yogurt, with fruit as a snack or dessert. Eat peanut or other nut butters on crackers, bread, toast, waffles, apples, bananas or celery sticks. Add it to milkshakes, smoothies, or desserts. Consider a ready-made protein shake.   Add Fats to Your Meals and Snacks  Try adding fats to your meals and snacks. Fat provides more calories in fewer bites than carbohydrate or protein and adds flavors to your foods.  Strategies  Snack on nuts and seeds or add them to foods like salads, pasta, cereals, yogurt, and ice cream.  Saut or stir-fry vegetables, meats, chicken, fish or tofu in  olive or canola oil.  Add olive oil, other vegetable oils, butter or margarine to soups, vegetables, potatoes, cooked cereal, rice, pasta, bread, crackers, pancakes, or waffles. Snack on olives or add to pasta, pizza, or salad. Add avocado or guacamole to your salads, sandwiches, and other entrees. Include fatty fish such as salmon in your weekly meal plan.  Tips For Adding Protein Nutrition Therapy    Tips Add extra egg to one or more meals  Increase the portion of milk to drink and change to skim milk if able  Include [place] yogurt or cottage cheese  for snack or part of a meal  Increase portion size of protein entre and decrease portion of starch/bread  Mix protein powder, nut butter, almond/nut milk, non-fat dry milk, or Greek yogurt to shakes and smoothies  Use these ingredients also in baked goods or other recipes Use double the amount of sandwich filling  Add protein foods to all snacks including cheese, nut butters, milk and yogurt  Food Tips for Including Protein  Beans Cook and use dried peas, beans, and tofu in soups or add to casseroles, pastas, and grain dishes that also contain cheese or meat  Mash with cheese and milk  Use tofu to make smoothies  Commercial Protein Supplements Use nutritional supplements or protein powder sold at pharmacies and grocery stores  Use protein powder in milk drinks and desserts, such as pudding  Mix with ice cream, milk, and fruit or other flavorings for a high-protein milkshake  Cottage Cheese or [employer] Mix with or use to stuff fruits and vegetables  Add to casseroles, spaghetti, noodles, or egg dishes such as omelets, scrambled eggs, and souffls  Use gelatin, pudding-type desserts, cheesecake, and pancake or waffle batter  Use to stuff crepes, pasta shells, or manicotti  Puree and use as a substitute for sour cream  Eggs, Egg whites, and Egg Yolks Add chopped, hard-cooked eggs to salads and dressings, vegetables, casseroles, and creamed meats  Beat eggs into mashed potatoes, vegetable purees, and sauces  Add extra egg whites to quiches, scrambled eggs, custards, puddings, pancake batter, or [place] toast wash/batter  Make a rich custard with egg yolks, double strength milk, and sugar  Add extra hard-cooked yolks to deviled egg filling and sandwich spreads  Hard or Semi-Soft Cheese (Cheddar, Barnabas Lister, [place]) Melt on sandwiches, bread, muffins, tortillas, hamburgers, hot dogs, other meats or fish, vegetables, eggs, or desserts such as stewed figs or pies  Grate and add to soups, sauces,  casseroles, vegetable dishes, potatoes, rice noodles, or meatloaf  Serve as a snack with crackers or bagels  Ice cream, Yogurt, and Frozen Yogurt Add to milk drinks such as milkshakes  Add to cereals, fruits, gelatin desserts, and pies  Blend or whip with soft or cooked fruits  Sandwich ice cream or frozen yogurt between enriched cake slices, cookies, or graham crackers  Use seasoned yogurt as a dip for fruits, vegetables, or chips  Use yogurt in place of sour cream in casseroles  Meat and Fish Add chopped, cooked meat or fish to vegetables, salads, casseroles, soups, sauces, and biscuit dough  Use in omelets, souffls, quiches, and sandwich fillings  Add chicken and [place] to stuffing  Wrap in pie crust or biscuit dough as turnovers  Add to stuffed baked potatoes  Add pureed meat to soups  Milk Use in beverages and in cooking  Use in preparing foods, such as hot cereal, soups, cocoa, or pudding  Add cream sauces to vegetable and  other dishes  Use evaporated milk, evaporated skim milk, or sweetened condensed milk instead of milk or water in recipes.  Nonfat Dry Milk Add 1/3 cup of nonfat dry milk powdered milk to each cup of regular milk for "double strength" milk  Add to yogurt and milk drinks, such as pasteurized eggnog and milkshakes  Add to scrambled eggs and mashed potatoes  Use in casseroles, meatloaf, hot cereal, breads, muffins, sauces, cream soups, puddings and custards, and other milk-based desserts  Nuts, Seeds, and Wheat Germ Add to casseroles, breads, muffins, pancakes, cookies, and waffles  Sprinkle on fruit, cereal, ice cream, yogurt, vegetables, salads, and toast as a crunchy topping  Use in place of breadcrumbs  Blend with parsley or spinach, herbs, and cream for a noodle, pasta, or vegetable sauce.  Roll banana in chopped nuts  Peanut Butter Spread on sandwiches, toast, muffins, crackers, waffles, pancakes, and fruit slices  Use as a dip for raw vegetables, such as  carrots, cauliflower, and celery  Blend with milk drinks, smoothies, and other beverages  Swirl through soft ice cream or yogurt  Spread on a banana then roll in crushed, dry cereal or chopped nuts   Small Meal and Snack Ideas  These snacks and meals are recommended when you have to eat but aren't necessarily hungry.  They are good choices because they are high in protein and high in calories.   2 graham crackers, 2 tablespoons peanut or other nut butter, 1 cup milk  cup [place] yogurt,  cup fruit,  cup granola 2 deviled egg halves, 5 whole wheat crackers 1 cup cream of tomato soup,  grilled cheese sandwich 1 toasted waffle topped with: 2 tablespoons peanut or nut butter, 1 tablespoon jam Trail mix made with:  cup nuts,  cup dried fruit,  cup cold cereal, any variety  cup oatmeal or cream of wheat cereal, 1 tablespoon peanut or nut butter,  cup diced fruit  High-Calorie, High-Protein Sample 1-Day Menu   Breakfast 1 egg, scrambled 1 ounce cheddar cheese 1 English muffin, whole wheat 1 tablespoon margarine 1 tablespoon jam  cup orange juice, fortified with calcium and vitamin D  Morning Snack 1 tablespoon peanut butter 1 banana 1 cup 1% milk  Lunch Tuna salad sandwich made with: 2 slices bread, whole wheat 3 ounces tuna mixed with: 1 tablespoon mayonnaise  cup pudding  Afternoon Snack  cup hummus  cup carrots 1 pita  Evening Meal Enchilada casserole made with: 2 corn tortillas 3 ounces ground beef, cooked  cup black beans, cooked  cup corn, cooked 1 ounce grated cheddar cheese  cup enchilada sauce  avocado, sliced, topping for enchilada 1 tablespoon sour cream, topping for enchilada Salad:  cup lettuce, shredded  cup tomatoes, chopped, for salad 1 tablespoon olive oil and vinegar dressing, for salad  Evening Snack  cup Greek yogurt  cup blueberries  cup granola  Copyright 2020  [employer]. All rights reserved       Signed: Linus Galas, MD 08/04/2022, 2:57 PM   Pager: [phone]

## 2022-08-05 ENCOUNTER — Inpatient Hospital Stay: Payer: Commercial Managed Care - HMO | Attending: Hematology | Admitting: Hematology

## 2022-08-05 ENCOUNTER — Other Ambulatory Visit: Payer: Self-pay

## 2022-08-05 ENCOUNTER — Telehealth: Payer: Self-pay | Admitting: Hematology

## 2022-08-05 ENCOUNTER — Telehealth: Payer: Self-pay

## 2022-08-05 ENCOUNTER — Inpatient Hospital Stay: Payer: Commercial Managed Care - HMO

## 2022-08-05 VITALS — BP 113/74 | HR 102 | Temp 97.7°F | Resp 16 | Ht 62.0 in | Wt 108.5 lb

## 2022-08-05 DIAGNOSIS — R590 Localized enlarged lymph nodes: Secondary | ICD-10-CM

## 2022-08-05 DIAGNOSIS — G893 Neoplasm related pain (acute) (chronic): Secondary | ICD-10-CM | POA: Diagnosis not present

## 2022-08-05 DIAGNOSIS — C7951 Secondary malignant neoplasm of bone: Secondary | ICD-10-CM | POA: Diagnosis not present

## 2022-08-05 DIAGNOSIS — Z681 Body mass index (BMI) 19 or less, adult: Secondary | ICD-10-CM | POA: Diagnosis not present

## 2022-08-05 DIAGNOSIS — E43 Unspecified severe protein-calorie malnutrition: Secondary | ICD-10-CM

## 2022-08-05 DIAGNOSIS — F32A Depression, unspecified: Secondary | ICD-10-CM | POA: Diagnosis not present

## 2022-08-05 DIAGNOSIS — C419 Malignant neoplasm of bone and articular cartilage, unspecified: Secondary | ICD-10-CM | POA: Insufficient documentation

## 2022-08-05 DIAGNOSIS — Z79899 Other long term (current) drug therapy: Secondary | ICD-10-CM

## 2022-08-05 DIAGNOSIS — C3490 Malignant neoplasm of unspecified part of unspecified bronchus or lung: Secondary | ICD-10-CM

## 2022-08-05 DIAGNOSIS — J90 Pleural effusion, not elsewhere classified: Secondary | ICD-10-CM

## 2022-08-05 DIAGNOSIS — J3800 Paralysis of vocal cords and larynx, unspecified: Secondary | ICD-10-CM

## 2022-08-05 DIAGNOSIS — Z8615 Personal history of latent tuberculosis infection: Secondary | ICD-10-CM | POA: Diagnosis not present

## 2022-08-05 DIAGNOSIS — Z8611 Personal history of tuberculosis: Secondary | ICD-10-CM

## 2022-08-05 DIAGNOSIS — F419 Anxiety disorder, unspecified: Secondary | ICD-10-CM

## 2022-08-05 LAB — ACID FAST SMEAR (AFB, MYCOBACTERIA): Acid Fast Smear: NEGATIVE

## 2022-08-05 MED ORDER — DEXAMETHASONE 4 MG PO TABS: ORAL_TABLET | ORAL | 1 refills | Status: DC

## 2022-08-05 MED ORDER — LIDOCAINE-PRILOCAINE 2.5-2.5 % EX CREA: TOPICAL_CREAM | CUTANEOUS | 3 refills | Status: DC

## 2022-08-05 MED ORDER — FOLIC ACID 1 MG PO TABS: 1.0000 mg | ORAL_TABLET | Freq: Every day | ORAL | 3 refills | Status: DC

## 2022-08-05 NOTE — Telephone Encounter (Signed)
Called patient to try and get appointments rescheduled so patient wouldn't have to come multiple days for multiple appointments. Left voicemail using interpreter service for patient to call back and confirm new appointment times.

## 2022-08-05 NOTE — Progress Notes (Signed)
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 has a 20 to 30% chance of having a possible targetable mutation. -We discussed that her condition is not curable since it is metastatic but the outcomes can be fairly variable depending on whether she has a targetable mutation and how she tolerates treatment and her overall functional status. -Dietary consultation to address his nutritional status -Social work consultation to address other barriers to care including her questions regarding disability applications etc.. -We discussed would recommend combination chemoimmunotherapy pending final results of her mutation testing. -We will proceed with cycle 1 with only carboplatin Alimta and hold Keytruda for cycle 1 in case we need to transition to other targeted therapies.  If her mutation profiles did not reveal any targetable mutation we would proceed with the Keytruda in addition to [place] Alimta from cycle 2. -Her premedications including folic acid were sent to her pharmacy -She will need a B12 shot when she comes for chemo counseling for her treatment and this will be prior to her treatment. -Chemo  counseling for carboplatin Alimta Keytruda -Referral to palliative care to help with her pain management other supportive cares. -Discussed with the patient and her  husband that she needs to maintain close follow-up with her behavioral health team at [facility] -Discussed and patient agreeable with proceeding with monthly Xgeva.  Does not note any acute dental issues at this time. -Discussed referral to radiation oncology for possible palliative radiation to her painful bony lesions. -Discussed the patient's and her husband's multiple other questions. -Spanish video interpreter used for this visit.  The total time spent in the appointment was 80 minutes*.  All of the patient's questions were answered with apparent satisfaction. The patient knows to call the clinic with any problems, questions or concerns.   Sullivan Lone MD MS AAHIVMS [facility] Hematology/Oncology Physician [facility]  .*Total Encounter Time as defined by the Centers for Medicare and Medicaid Services includes, in addition to the face-to-face time of a patient visit (documented in the note above) non-face-to-face time: obtaining and reviewing outside history, ordering and reviewing medications, tests or procedures, care coordination (communications with other health care professionals or caregivers) and documentation in the medical record.   08/05/2022 9:57 AM

## 2022-08-05 NOTE — Progress Notes (Signed)
[place] CSW Progress Note  Clinical Education officer, museum contacted patient by phone to discuss disability per medical provider.  Called patient's home and she answered.  She said CSW must speak with her husband who was not available.  CSW to attempt calling again.Rodman Pickle Karen Pixley, LCSW

## 2022-08-05 NOTE — Progress Notes (Signed)
Internal Medicine Clinic Attending  I saw and evaluated the patient.  I personally confirmed the key portions of the history and exam documented by Dr. Alton Revere and I reviewed pertinent patient test results.  The assessment, diagnosis, and plan were formulated together and I agree with the documentation in the resident's note.  Hospital is the best place to be for symptom management and expediting her diagnostics.

## 2022-08-05 NOTE — Telephone Encounter (Signed)
Per 2/15 IB reached out to patient to schedule appointment, patient is aware and confirmed.

## 2022-08-05 NOTE — Progress Notes (Signed)
START ON PATHWAY REGIMEN - Non-Small Cell Lung     A cycle is every 21 days:     Pemetrexed      Carboplatin   **Always confirm dose/schedule in your pharmacy ordering system**  Patient Characteristics: Stage IV Metastatic, Nonsquamous, Awaiting Molecular Test Results and Need to Start Chemotherapy, PS = 0, 1 Therapeutic Status: Stage IV Metastatic Histology: Nonsquamous Cell Broad Molecular Profiling Status: Awaiting Molecular Test Results and Need to Start Chemotherapy ECOG Performance Status: 1 Intent of Therapy: Non-Curative / Palliative Intent, Discussed with Patient

## 2022-08-05 NOTE — Transitions of Care (Post Inpatient/ED Visit) (Signed)
   08/05/2022  Name: Karen [name] MRN: [id number] DOB: [birth year]  Today's TOC FU Call Status: Today's TOC FU Call Status:: Successful TOC FU Call Competed TOC FU Call Complete Date: 08/05/22  Transition Care Management Follow-up Telephone Call Date of Discharge: 08/04/22 Discharge Facility: [facility] Type of Discharge: Inpatient Admission How have you been since you were released from the hospital?: Better Any questions or concerns?: No  Items Reviewed: Did you receive and understand the discharge instructions provided?: Yes Medications obtained and verified?: Yes (Medications Reviewed) Any new allergies since your discharge?: No Dietary orders reviewed?: NA Do you have support at home?: Yes People in Home: spouse  Home Care and Equipment/Supplies: [place] Ordered?: NA Any new equipment or medical supplies ordered?: NA  Functional Questionnaire: Do you need assistance with bathing/showering or dressing?: No Do you need assistance with meal preparation?: No Do you need assistance with eating?: No Do you have difficulty maintaining continence: No Do you need assistance with getting out of bed/getting out of a chair/moving?: No Do you have difficulty managing or taking your medications?: No  Folllow up appointments reviewed: PCP Follow-up appointment confirmed?: No (no avail appt times, sent message to staff to schedule) Specialist Hospital Follow-up appointment confirmed?: Yes Date of Specialist follow-up appointment?: 08/05/22 Follow-Up Specialty Provider:: Heme[facility]onco Do you need transportation to your follow-up appointment?: No Do you understand care options if your condition(s) worsen?: Yes-patient verbalized understanding    SIGNATURE Juanda Crumble, [place] Nurse Health Advisor Direct Dial [phone]

## 2022-08-05 NOTE — Telephone Encounter (Signed)
Called patient again to confirm new appointment dates/times. Patient notified and OK with new dates.

## 2022-08-06 ENCOUNTER — Other Ambulatory Visit: Payer: Self-pay

## 2022-08-06 ENCOUNTER — Inpatient Hospital Stay: Payer: Commercial Managed Care - HMO

## 2022-08-06 LAB — MISC LABCORP TEST (SEND OUT): Labcorp test code: 81950

## 2022-08-06 LAB — CYTOLOGY - NON PAP

## 2022-08-06 LAB — AEROBIC/ANAEROBIC CULTURE W GRAM STAIN (SURGICAL/DEEP WOUND): Gram Stain: NONE SEEN

## 2022-08-06 NOTE — Progress Notes (Signed)
[place] CSW Progress Note  Clinical Education officer, museum contacted caregiver by phone to assess needs.  Spoke with patient's husband, Karen [name].  He stated his wife would like to begin the disability process since she is no longer working.  CSW securely emailed him the Release of Information for the [facility] to begin the application.    Rodman Pickle [name], LCSW

## 2022-08-08 ENCOUNTER — Other Ambulatory Visit: Payer: Self-pay

## 2022-08-10 ENCOUNTER — Inpatient Hospital Stay: Payer: Commercial Managed Care - HMO | Admitting: Dietician

## 2022-08-10 ENCOUNTER — Other Ambulatory Visit: Payer: Self-pay

## 2022-08-10 ENCOUNTER — Ambulatory Visit (INDEPENDENT_AMBULATORY_CARE_PROVIDER_SITE_OTHER): Payer: Commercial Managed Care - HMO | Admitting: Internal Medicine

## 2022-08-10 ENCOUNTER — Inpatient Hospital Stay (HOSPITAL_BASED_OUTPATIENT_CLINIC_OR_DEPARTMENT_OTHER): Payer: Commercial Managed Care - HMO | Admitting: Nurse Practitioner

## 2022-08-10 ENCOUNTER — Inpatient Hospital Stay: Payer: Commercial Managed Care - HMO

## 2022-08-10 ENCOUNTER — Encounter: Payer: Self-pay | Admitting: Nurse Practitioner

## 2022-08-10 ENCOUNTER — Ambulatory Visit (HOSPITAL_COMMUNITY)
Admission: RE | Admit: 2022-08-10 | Discharge: 2022-08-10 | Disposition: A | Discharge status: Home / Self Care | Payer: Commercial Managed Care - HMO | Source: Ambulatory Visit | Attending: Internal Medicine | Admitting: Internal Medicine

## 2022-08-10 VITALS — BP 117/75 | HR 71 | Temp 97.9°F | Wt 102.2 lb

## 2022-08-10 VITALS — BP 137/75 | HR 73 | Temp 98.1°F | Resp 13 | Wt 102.1 lb

## 2022-08-10 DIAGNOSIS — R530 Neoplastic (malignant) related fatigue: Secondary | ICD-10-CM

## 2022-08-10 DIAGNOSIS — R599 Enlarged lymph nodes, unspecified: Secondary | ICD-10-CM

## 2022-08-10 DIAGNOSIS — F419 Anxiety disorder, unspecified: Secondary | ICD-10-CM

## 2022-08-10 DIAGNOSIS — R52 Pain, unspecified: Secondary | ICD-10-CM | POA: Diagnosis not present

## 2022-08-10 DIAGNOSIS — Z515 Encounter for palliative care: Secondary | ICD-10-CM | POA: Diagnosis not present

## 2022-08-10 DIAGNOSIS — G43009 Migraine without aura, not intractable, without status migrainosus: Secondary | ICD-10-CM | POA: Diagnosis not present

## 2022-08-10 DIAGNOSIS — R202 Paresthesia of skin: Secondary | ICD-10-CM | POA: Diagnosis not present

## 2022-08-10 DIAGNOSIS — Z7189 Other specified counseling: Secondary | ICD-10-CM

## 2022-08-10 DIAGNOSIS — C3490 Malignant neoplasm of unspecified part of unspecified bronchus or lung: Secondary | ICD-10-CM

## 2022-08-10 DIAGNOSIS — R630 Anorexia: Secondary | ICD-10-CM

## 2022-08-10 DIAGNOSIS — G43109 Migraine with aura, not intractable, without status migrainosus: Secondary | ICD-10-CM | POA: Diagnosis not present

## 2022-08-10 DIAGNOSIS — F339 Major depressive disorder, recurrent, unspecified: Secondary | ICD-10-CM

## 2022-08-10 DIAGNOSIS — C419 Malignant neoplasm of bone and articular cartilage, unspecified: Secondary | ICD-10-CM

## 2022-08-10 DIAGNOSIS — R112 Nausea with vomiting, unspecified: Secondary | ICD-10-CM | POA: Insufficient documentation

## 2022-08-10 DIAGNOSIS — R634 Abnormal weight loss: Secondary | ICD-10-CM

## 2022-08-10 DIAGNOSIS — F32A Depression, unspecified: Secondary | ICD-10-CM

## 2022-08-10 DIAGNOSIS — G893 Neoplasm related pain (acute) (chronic): Secondary | ICD-10-CM

## 2022-08-10 MED ORDER — CYANOCOBALAMIN 1000 MCG/ML IJ SOLN
1000.0000 ug | Freq: Once | INTRAMUSCULAR | Status: AC
  Administered 2022-08-10: 1000 ug via SUBCUTANEOUS
  Filled 2022-08-10: qty 1

## 2022-08-10 MED ORDER — HYDROCODONE-ACETAMINOPHEN 5-325 MG PO TABS: 1.0000 | ORAL_TABLET | Freq: Four times a day (QID) | ORAL | 0 refills | Status: DC | PRN

## 2022-08-10 MED ORDER — CLONAZEPAM 0.5 MG PO TABS: 0.5000 mg | ORAL_TABLET | Freq: Two times a day (BID) | ORAL | 1 refills | Status: DC

## 2022-08-10 NOTE — Patient Instructions (Signed)
Vitamin B12 Deficiency Vitamin B12 deficiency means that your body does not have enough vitamin B12. The body needs this important vitamin: To make red blood cells. To make genes (DNA). To help the nerves work. If you do not have enough vitamin B12 in your body, you can have health problems, such as not having enough red blood cells in the blood (anemia). What are the causes? Not eating enough foods that contain vitamin B12. Not being able to take in (absorb) vitamin B12 from the food that you eat. Certain diseases. A condition in which the body does not make enough of a certain protein. This results in your body not taking in enough vitamin B12. Having a surgery in which part of the stomach or small intestine is taken out. Taking medicines that make it hard for the body to take in vitamin B12. These include: Heartburn medicines. Some medicines that are used to treat diabetes. What increases the risk? Being an older adult. Eating a vegetarian or vegan diet that does not include any foods that come from animals. Not eating enough foods that contain vitamin B12 while you are pregnant. Taking certain medicines. Having alcoholism. What are the signs or symptoms? In some cases, there are no symptoms. If the condition leads to too few blood cells or nerve damage, symptoms can occur, such as: Feeling weak or tired. Not being hungry. Losing feeling (numbness) or tingling in your hands and feet. Redness and burning of the tongue. Feeling sad (depressed). Confusion or memory problems. Trouble walking. If anemia is very bad, symptoms can include: Being short of breath. Being dizzy. Having a very fast heartbeat. How is this treated? Changing the way you eat and drink, such as: Eating more foods that contain vitamin B12. Drinking little or no alcohol. Getting vitamin B12 shots. Taking vitamin B12 supplements by mouth (orally). Your doctor will tell you the dose that is best for you. Follow  these instructions at home: Eating and drinking  Eat foods that come from animals and have a lot of vitamin B12 in them. These include: Meats and poultry. This includes beef, pork, chicken, turkey, and organ meats, such as liver. Seafood, such as clams, rainbow trout, salmon, tuna, and haddock. Eggs. Dairy foods such as milk, yogurt, and cheese. Eat breakfast cereals that have vitamin B12 added to them (are fortified). Check the label. The items listed above may not be a complete list of foods and beverages you can eat and drink. Contact a dietitian for more information. Alcohol use Do not drink alcohol if: Your doctor tells you not to drink. You are pregnant, may be pregnant, or are planning to become pregnant. If you drink alcohol: Limit how much you have to: 0-1 drink a day for women. 0-2 drinks a day for men. Know how much alcohol is in your drink. In the [place] one drink equals one 12 oz bottle of beer (355 mL), one 5 oz glass of wine (148 mL), or one 1 oz glass of hard liquor (44 mL). General instructions Get any vitamin B12 shots if told by your doctor. Take supplements only as told by your doctor. Follow the directions. Keep all follow-up visits. Contact a doctor if: Your symptoms come back. Your symptoms get worse or do not get better with treatment. Get help right away if: You have trouble breathing. You have a very fast heartbeat. You have chest pain. You get dizzy. You faint. These symptoms may be an emergency. Get help right away. Call 911.   Do not wait to see if the symptoms will go away. Do not drive yourself to the hospital. Summary Vitamin B12 deficiency means that your body is not getting enough of the vitamin. In some cases, there are no symptoms of this condition. Treatment may include making a change in the way you eat and drink, getting shots, or taking supplements. Eat foods that have vitamin B12 in them. This information is not intended to replace advice  given to you by your health care provider. Make sure you discuss any questions you have with your health care provider. Document Revised: 01/30/2021 Document Reviewed: 01/30/2021 Elsevier Patient Education  2023 Elsevier Inc.  

## 2022-08-10 NOTE — Patient Instructions (Addendum)
[name], it was a pleasure seeing you today! You endorsed feeling well today. Below are some of the things we talked about this visit. We look forward to seeing you in the follow up appointment!  Today we discussed: We will refer you to palliative care clinic.  We will check lab work today.  Continue to follow with oncology.   I have ordered the following labs today:   Lab Orders         BMP8+Anion Gap       Referrals ordered today:   Referral Orders  No referral(s) requested today     I have ordered the following medication/changed the following medications:   Stop the following medications: Medications Discontinued During This Encounter  Medication Reason   clonazePAM (KLONOPIN) 0.5 MG tablet Change in therapy     Start the following medications: Meds ordered this encounter  Medications   clonazePAM (KLONOPIN) 0.5 MG tablet    Sig: Take 1 tablet (0.5 mg total) by mouth 2 (two) times daily.    Dispense:  60 tablet    Refill:  1     Follow-up: 1 month follow up  Please make sure to arrive 15 minutes prior to your next appointment. If you arrive late, you may be asked to reschedule.   We look forward to seeing you next time. Please call our clinic at [phone] if you have any questions or concerns. The best time to call is Monday-Friday from 9am-4pm, but there is someone available 24/7. If after hours or the weekend, call the main hospital number and ask for the Internal Medicine Resident On-Call. If you need medication refills, please notify your pharmacy one week in advance and they will send [place] a request.  Thank you for letting us take part in your care. Wishing you the best!  Thank you, [name], MD   Karen [name], fue un placer verla hoy! Respaldaste sentirte bien hoy. A continuacin se presentan algunas de las cosas que hablamos sobre esta visita. Esperamos verlo en la cita de seguimiento!  Hoy discutimos: Lo derivaremos  a una clnica de cuidados paliativos. Revisaremos el trabajo de laboratorio hoy. Continuar siguiendo con [employer] transport manager.  He ordenado los siguientes laboratorios hoy:   rdenes de laboratorio      BMP8+brecha aninica   Referencias ordenadas hoy:  rdenes de referencia No se solicitaron referencias hoy    He ordenado/cambiado los siguientes medicamentos:  Suspenda los siguientes medicamentos: Medicamentos discontinuados durante este encuentro Motivo de la medicacin  clonazePAM (KLONOPIN) tableta de 0,5 MG Cambio en la terapia    Inicie los siguientes medicamentos: Los mdicos ordenaron este encuentro Medicamentos  clonazePAM (KLONOPIN) tableta de 0,5 mg Sig: Tome 1 tableta (0,5 mg en total) por va oral 2 (dos) veces al [employer]. Dispensacin: 60 comprimidos Recarga: 1    Seguimiento: 1 mes de seguimiento.  Asegrese de llegar 15 minutos antes de su prxima cita. Si llega tarde, es posible que le soliciten reprogramar su cita.  Esperamos verte la prxima vez. Llame a nuestra clnica al [phone] si tiene alguna pregunta o inquietud. El mejor momento para llamar es de lunes a viernes de 9 a. m. a 4 p. m., pero hay alguien disponible las 24 horas, los 7 das de la [place]. Si es fuera del horario de atencin o durante el fin de [place], llame al nmero principal del hospital y pregunte por el residente de guardia de medicina interna. Si necesita reabastecimiento de medicamentos, notifique a su  farmacia con una semana de anticipacin y ellos nos enviarn una solicitud.  Gracias por permitirnos participar en su cuidado. Deseandote lo mejor!  Collie Siad, MD

## 2022-08-10 NOTE — Progress Notes (Signed)
Scheduled to see patient for nutrition assessment per provider referral. Chart reviewed. Patient cancelled appointment. Please re-consult RD should patient wish to reschedule.

## 2022-08-10 NOTE — Patient Instructions (Addendum)
-   for your nausea take zofran every 8 hours AND compazine every 6 hours, alternate them to give coverage in the middle - take your pain medication hydrocodone-acetaminophen (norco/vicodin) every 4 hours as needed for pain 1 or 2 pills depending on your level of pain - pick up your refill at the [facility]  - call the office at [phone] 2-3 days prior to being out of medication   - on Friday after your education we will give you IV fluids and IV nausea medication    - para tus nuseas toma zofran cada 8 horas Y compazine cada 6 horas, alternalos para dar cobertura a la mitad - tome su analgsico hidrocodona-acetaminofn (norco/vicodin) cada 4 horas segn sea necesario para el dolor 1 o 2 pastillas dependiendo de su nivel de dolor - recoja su resurtido en la [facility] - llame a la oficina al [phone] 2-3 das antes de quedarse sin medicamento - el viernes despus de su educacin le daremos lquidos por va intravenosa y medicamentos para las nuseas por va intravenosa.
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
## 2022-08-11 ENCOUNTER — Encounter: Payer: Self-pay | Admitting: Hematology

## 2022-08-11 ENCOUNTER — Encounter: Payer: Commercial Managed Care - HMO | Admitting: Dietician

## 2022-08-11 LAB — BMP8+ANION GAP
Anion Gap: 19 mmol/L — ABNORMAL HIGH (ref 10.0–18.0)
BUN/Creatinine Ratio: 26 — ABNORMAL HIGH (ref 9–23)
BUN: 19 mg/dL (ref 6–24)
CO2: 20 mmol/L (ref 20–29)
Calcium: 9.4 mg/dL (ref 8.7–10.2)
Chloride: 101 mmol/L (ref 96–106)
Creatinine, Ser: 0.73 mg/dL (ref 0.57–1.00)
Glucose: 115 mg/dL — ABNORMAL HIGH (ref 70–99)
Potassium: 4 mmol/L (ref 3.5–5.2)
Sodium: 140 mmol/L (ref 134–144)
eGFR: 96 mL/min/{1.73_m2} (ref >59)

## 2022-08-11 NOTE — Progress Notes (Signed)
Pharmacist Chemotherapy Monitoring - Initial Assessment    Anticipated start date: 08/18/22   The following has been reviewed per standard work regarding the patient's treatment regimen: The patient's diagnosis, treatment plan and drug doses, and organ/hematologic function Lab orders and baseline tests specific to treatment regimen  The treatment plan start date, drug sequencing, and pre-medications Prior authorization status  Patient's documented medication list, including drug-drug interaction screen and prescriptions for anti-emetics and supportive care specific to the treatment regimen The drug concentrations, fluid compatibility, administration routes, and timing of the medications to be used The patient's access for treatment and lifetime cumulative dose history, if applicable  The patient's medication allergies and previous infusion related reactions, if applicable   Changes made to treatment plan:  N/A  Follow up needed:  Pending authorization for treatment  Karen [name] and Karen [name] working on prior auth (08/11/22)   Karen [name], [place], 08/11/2022  2:06 PM

## 2022-08-12 ENCOUNTER — Telehealth: Payer: Self-pay | Admitting: *Deleted

## 2022-08-12 ENCOUNTER — Other Ambulatory Visit: Payer: Self-pay | Admitting: Internal Medicine

## 2022-08-12 ENCOUNTER — Other Ambulatory Visit: Payer: Self-pay

## 2022-08-12 DIAGNOSIS — C3490 Malignant neoplasm of unspecified part of unspecified bronchus or lung: Secondary | ICD-10-CM

## 2022-08-12 MED ORDER — TRIMETHOBENZAMIDE HCL 300 MG PO CAPS: 300.0000 mg | ORAL_CAPSULE | Freq: Three times a day (TID) | ORAL | 0 refills | Status: DC | PRN

## 2022-08-12 NOTE — Assessment & Plan Note (Addendum)
Pt with recent diagnosis of metastatic lung adenocarcinoma. Has established with Dr. Irene Limbo and plan for treatment with Carboplatin, Alimta and Keytruda. Referral to palliative care placed by me but appears pt has been seen by palliative care at the oncology office so will cancel this referral. Advised to have close follow up with Oncology.

## 2022-08-12 NOTE — Telephone Encounter (Signed)
Patient called in stating she is on her way to [facility] ED. States she has not felt well for 2 days. C/o diarrhea, h/a, and dizziness.

## 2022-08-12 NOTE — Assessment & Plan Note (Addendum)
Patient has MDD and sees [facility] monthly who prescribe her Zoloft 100 mg qd. Her PHQ 9 this visit was 23 which appears to be exacerbated by her recent diagnosis of metastatic lung adenocarcinoma. Pt tearful throughout the interview. She states her oncologist told her prognosis ranges from 1 to 3 years but can be higher in some people. Per her boyfriend, pt is not sleeping well and not eating well since getting this diagnosis. Advised pt to continue following with oncology for her cancer and [facility] for her MDD and anxiety. She may benefit from Mirtazapine given concern for weight loss but will hold until she follows with [facility]. Her symptoms appear to be consistent with adjustment secondary to her recent diagnosis and will likely improve with time.

## 2022-08-12 NOTE — Assessment & Plan Note (Addendum)
Pt presented to clinic with last visit with nausea and vomiting and poor oral intake. She was admitted for this and expedited work up of the concern for malignancy. The N/V were attributed to her increased anxiety regarding potential for malignancy at that time. Central causes were suspected and brain MRI was done but that was negative for acute findings including metastatic spread to the brain. She was given zofran 8 mg q 8 hrs and compazine for refractory nausea. She states this provides her some relief. Of note her weight has decreased from 108 at discharge to 102 this visit. Qtc was normal but near the upper limit of normal at her hospitalization at 452. Repeat this visit improved to 442. Will continue zofran as needed and start on Tigan 300 mg TID prn for nausea. Will use these agents for her nausea. We are also increasing her ativan to 0.50 mg BID which can help her nausea especially given this is likely the factor exacerbating her nausea and vomiting. BMP repeated for assess for electrolyte imbalance but is reassuring.  -Continue Zofran 8 mg qhrs prn, start tigan 300 mg TID prn, and increase ativan to 0.50 mg BID.

## 2022-08-12 NOTE — Assessment & Plan Note (Signed)
Pt was started on Propanolol 40 mg BID, and sumitriptan 50 mg as needed. Referral to neurology for migraines placed. Appears to have been exacerbated by her recent diagnosis of lung malignancy.

## 2022-08-13 ENCOUNTER — Encounter (HOSPITAL_COMMUNITY): Payer: Self-pay

## 2022-08-13 ENCOUNTER — Other Ambulatory Visit: Payer: Commercial Managed Care - HMO

## 2022-08-13 ENCOUNTER — Ambulatory Visit (HOSPITAL_COMMUNITY): Payer: Commercial Managed Care - HMO

## 2022-08-13 ENCOUNTER — Telehealth: Payer: Self-pay | Admitting: Hematology

## 2022-08-13 ENCOUNTER — Other Ambulatory Visit: Payer: Self-pay | Admitting: Internal Medicine

## 2022-08-13 ENCOUNTER — Inpatient Hospital Stay (HOSPITAL_COMMUNITY)
Admission: EM | Admit: 2022-08-13 | Discharge: 2022-08-17 | DRG: 981 | Disposition: A | Discharge status: Home / Self Care | Payer: Commercial Managed Care - HMO | Attending: Internal Medicine | Admitting: Internal Medicine

## 2022-08-13 ENCOUNTER — Emergency Department (HOSPITAL_COMMUNITY): Payer: Commercial Managed Care - HMO

## 2022-08-13 ENCOUNTER — Encounter (HOSPITAL_COMMUNITY): Payer: Self-pay | Admitting: Emergency Medicine

## 2022-08-13 ENCOUNTER — Other Ambulatory Visit: Payer: Self-pay

## 2022-08-13 ENCOUNTER — Inpatient Hospital Stay (HOSPITAL_COMMUNITY): Payer: Commercial Managed Care - HMO

## 2022-08-13 ENCOUNTER — Telehealth: Payer: Self-pay

## 2022-08-13 ENCOUNTER — Ambulatory Visit (HOSPITAL_COMMUNITY)
Admission: RE | Admit: 2022-08-13 | Discharge: 2022-08-13 | Disposition: A | Discharge status: Home / Self Care | Payer: Commercial Managed Care - HMO | Source: Ambulatory Visit | Attending: Hematology | Admitting: Hematology

## 2022-08-13 DIAGNOSIS — I3131 Malignant pericardial effusion in diseases classified elsewhere: Secondary | ICD-10-CM | POA: Diagnosis present

## 2022-08-13 DIAGNOSIS — R053 Chronic cough: Secondary | ICD-10-CM | POA: Diagnosis present

## 2022-08-13 DIAGNOSIS — C7951 Secondary malignant neoplasm of bone: Secondary | ICD-10-CM | POA: Diagnosis present

## 2022-08-13 DIAGNOSIS — G8194 Hemiplegia, unspecified affecting left nondominant side: Secondary | ICD-10-CM | POA: Diagnosis present

## 2022-08-13 DIAGNOSIS — G43109 Migraine with aura, not intractable, without status migrainosus: Secondary | ICD-10-CM | POA: Diagnosis present

## 2022-08-13 DIAGNOSIS — J910 Malignant pleural effusion: Secondary | ICD-10-CM | POA: Diagnosis present

## 2022-08-13 DIAGNOSIS — Z539 Procedure and treatment not carried out, unspecified reason: Secondary | ICD-10-CM | POA: Insufficient documentation

## 2022-08-13 DIAGNOSIS — F419 Anxiety disorder, unspecified: Secondary | ICD-10-CM

## 2022-08-13 DIAGNOSIS — F339 Major depressive disorder, recurrent, unspecified: Secondary | ICD-10-CM | POA: Diagnosis present

## 2022-08-13 DIAGNOSIS — C3490 Malignant neoplasm of unspecified part of unspecified bronchus or lung: Secondary | ICD-10-CM | POA: Diagnosis not present

## 2022-08-13 DIAGNOSIS — R531 Weakness: Secondary | ICD-10-CM | POA: Insufficient documentation

## 2022-08-13 DIAGNOSIS — G894 Chronic pain syndrome: Secondary | ICD-10-CM | POA: Diagnosis present

## 2022-08-13 DIAGNOSIS — C7931 Secondary malignant neoplasm of brain: Secondary | ICD-10-CM | POA: Diagnosis present

## 2022-08-13 DIAGNOSIS — F32A Depression, unspecified: Secondary | ICD-10-CM | POA: Diagnosis present

## 2022-08-13 DIAGNOSIS — Z79899 Other long term (current) drug therapy: Secondary | ICD-10-CM | POA: Diagnosis not present

## 2022-08-13 DIAGNOSIS — R29703 NIHSS score 3: Secondary | ICD-10-CM | POA: Diagnosis present

## 2022-08-13 DIAGNOSIS — G43909 Migraine, unspecified, not intractable, without status migrainosus: Secondary | ICD-10-CM | POA: Diagnosis present

## 2022-08-13 DIAGNOSIS — Z8673 Personal history of transient ischemic attack (TIA), and cerebral infarction without residual deficits: Secondary | ICD-10-CM | POA: Diagnosis present

## 2022-08-13 DIAGNOSIS — Z681 Body mass index (BMI) 19 or less, adult: Secondary | ICD-10-CM

## 2022-08-13 DIAGNOSIS — G43409 Hemiplegic migraine, not intractable, without status migrainosus: Secondary | ICD-10-CM | POA: Diagnosis not present

## 2022-08-13 DIAGNOSIS — Z8615 Personal history of latent tuberculosis infection: Secondary | ICD-10-CM | POA: Diagnosis not present

## 2022-08-13 DIAGNOSIS — F418 Other specified anxiety disorders: Secondary | ICD-10-CM | POA: Diagnosis not present

## 2022-08-13 DIAGNOSIS — D6859 Other primary thrombophilia: Secondary | ICD-10-CM | POA: Diagnosis present

## 2022-08-13 DIAGNOSIS — J3800 Paralysis of vocal cords and larynx, unspecified: Secondary | ICD-10-CM

## 2022-08-13 DIAGNOSIS — I6389 Other cerebral infarction: Secondary | ICD-10-CM | POA: Diagnosis not present

## 2022-08-13 DIAGNOSIS — I639 Cerebral infarction, unspecified: Secondary | ICD-10-CM | POA: Diagnosis present

## 2022-08-13 DIAGNOSIS — R202 Paresthesia of skin: Principal | ICD-10-CM

## 2022-08-13 DIAGNOSIS — R64 Cachexia: Secondary | ICD-10-CM | POA: Diagnosis present

## 2022-08-13 DIAGNOSIS — R200 Anesthesia of skin: Secondary | ICD-10-CM | POA: Diagnosis present

## 2022-08-13 DIAGNOSIS — E785 Hyperlipidemia, unspecified: Secondary | ICD-10-CM | POA: Diagnosis present

## 2022-08-13 DIAGNOSIS — E876 Hypokalemia: Secondary | ICD-10-CM | POA: Diagnosis present

## 2022-08-13 DIAGNOSIS — I071 Rheumatic tricuspid insufficiency: Secondary | ICD-10-CM | POA: Diagnosis present

## 2022-08-13 DIAGNOSIS — I10 Essential (primary) hypertension: Secondary | ICD-10-CM | POA: Diagnosis present

## 2022-08-13 DIAGNOSIS — Z8249 Family history of ischemic heart disease and other diseases of the circulatory system: Secondary | ICD-10-CM | POA: Diagnosis not present

## 2022-08-13 DIAGNOSIS — Z888 Allergy status to other drugs, medicaments and biological substances status: Secondary | ICD-10-CM

## 2022-08-13 DIAGNOSIS — I6340 Cerebral infarction due to embolism of unspecified cerebral artery: Secondary | ICD-10-CM | POA: Diagnosis present

## 2022-08-13 DIAGNOSIS — Z90710 Acquired absence of both cervix and uterus: Secondary | ICD-10-CM

## 2022-08-13 DIAGNOSIS — C419 Malignant neoplasm of bone and articular cartilage, unspecified: Secondary | ICD-10-CM | POA: Diagnosis present

## 2022-08-13 DIAGNOSIS — D649 Anemia, unspecified: Secondary | ICD-10-CM | POA: Diagnosis present

## 2022-08-13 DIAGNOSIS — Z885 Allergy status to narcotic agent status: Secondary | ICD-10-CM

## 2022-08-13 DIAGNOSIS — D630 Anemia in neoplastic disease: Secondary | ICD-10-CM | POA: Diagnosis present

## 2022-08-13 DIAGNOSIS — R112 Nausea with vomiting, unspecified: Secondary | ICD-10-CM | POA: Diagnosis present

## 2022-08-13 DIAGNOSIS — C3431 Malignant neoplasm of lower lobe, right bronchus or lung: Secondary | ICD-10-CM | POA: Diagnosis not present

## 2022-08-13 DIAGNOSIS — I361 Nonrheumatic tricuspid (valve) insufficiency: Secondary | ICD-10-CM | POA: Diagnosis not present

## 2022-08-13 DIAGNOSIS — Z8711 Personal history of peptic ulcer disease: Secondary | ICD-10-CM

## 2022-08-13 LAB — CBC WITH DIFFERENTIAL/PLATELET
Abs Immature Granulocytes: 0.05 10*3/uL (ref 0.00–0.07)
Basophils Absolute: 0.1 10*3/uL (ref 0.0–0.1)
Basophils Relative: 1 %
Eosinophils Absolute: 0.8 10*3/uL — ABNORMAL HIGH (ref 0.0–0.5)
Eosinophils Relative: 6 %
HCT: 38.8 % (ref 36.0–46.0)
Hemoglobin: 13.1 g/dL (ref 12.0–15.0)
Immature Granulocytes: 0 %
Lymphocytes Relative: 13 %
Lymphs Abs: 1.7 10*3/uL (ref 0.7–4.0)
MCH: 31.2 pg (ref 26.0–34.0)
MCHC: 33.8 g/dL (ref 30.0–36.0)
MCV: 92.4 fL (ref 80.0–100.0)
Monocytes Absolute: 0.9 10*3/uL (ref 0.1–1.0)
Monocytes Relative: 7 %
Neutro Abs: 9.7 10*3/uL — ABNORMAL HIGH (ref 1.7–7.7)
Neutrophils Relative %: 73 %
Platelets: 432 10*3/uL — ABNORMAL HIGH (ref 150–400)
RBC: 4.2 MIL/uL (ref 3.87–5.11)
RDW: 12.3 % (ref 11.5–15.5)
WBC: 13.2 10*3/uL — ABNORMAL HIGH (ref 4.0–10.5)
nRBC: 0 % (ref 0.0–0.2)

## 2022-08-13 LAB — CBC
HCT: 37.7 % (ref 36.0–46.0)
Hemoglobin: 12.5 g/dL (ref 12.0–15.0)
MCH: 30.6 pg (ref 26.0–34.0)
MCHC: 33.2 g/dL (ref 30.0–36.0)
MCV: 92.4 fL (ref 80.0–100.0)
Platelets: 430 10*3/uL — ABNORMAL HIGH (ref 150–400)
RBC: 4.08 MIL/uL (ref 3.87–5.11)
RDW: 12.4 % (ref 11.5–15.5)
WBC: 13.3 10*3/uL — ABNORMAL HIGH (ref 4.0–10.5)
nRBC: 0 % (ref 0.0–0.2)

## 2022-08-13 LAB — COMPREHENSIVE METABOLIC PANEL
ALT: 15 U/L (ref 0–44)
AST: 16 U/L (ref 15–41)
Albumin: 3.6 g/dL (ref 3.5–5.0)
Alkaline Phosphatase: 115 U/L (ref 38–126)
Anion gap: 10 (ref 5–15)
BUN: 19 mg/dL (ref 6–20)
CO2: 24 mmol/L (ref 22–32)
Calcium: 8.8 mg/dL — ABNORMAL LOW (ref 8.9–10.3)
Chloride: 102 mmol/L (ref 98–111)
Creatinine, Ser: 0.67 mg/dL (ref 0.44–1.00)
GFR, Estimated: >60 mL/min (ref >60)
Glucose, Bld: 109 mg/dL — ABNORMAL HIGH (ref 70–99)
Potassium: 3.1 mmol/L — ABNORMAL LOW (ref 3.5–5.1)
Sodium: 136 mmol/L (ref 135–145)
Total Bilirubin: 0.6 mg/dL (ref 0.3–1.2)
Total Protein: 6.9 g/dL (ref 6.5–8.1)

## 2022-08-13 LAB — CREATININE, SERUM
Creatinine, Ser: 0.86 mg/dL (ref 0.44–1.00)
GFR, Estimated: >60 mL/min (ref >60)

## 2022-08-13 LAB — TSH: TSH: 4.213 u[IU]/mL (ref 0.350–4.500)

## 2022-08-13 MED ORDER — KETOROLAC TROMETHAMINE 15 MG/ML IJ SOLN
15.0000 mg | Freq: Four times a day (QID) | INTRAMUSCULAR | Status: DC | PRN
  Administered 2022-08-14 – 2022-08-17 (×2): 15 mg via INTRAVENOUS
  Filled 2022-08-13 (×3): qty 1

## 2022-08-13 MED ORDER — ENOXAPARIN SODIUM 30 MG/0.3ML IJ SOSY
30.0000 mg | PREFILLED_SYRINGE | INTRAMUSCULAR | Status: DC
  Administered 2022-08-13 – 2022-08-15 (×3): 30 mg via SUBCUTANEOUS
  Filled 2022-08-13 (×3): qty 0.3

## 2022-08-13 MED ORDER — ASPIRIN 300 MG RE SUPP: 300.0000 mg | Freq: Every day | RECTAL | Status: DC

## 2022-08-13 MED ORDER — SENNOSIDES-DOCUSATE SODIUM 8.6-50 MG PO TABS: 1.0000 | ORAL_TABLET | Freq: Every evening | ORAL | Status: DC | PRN

## 2022-08-13 MED ORDER — ALBUTEROL SULFATE (2.5 MG/3ML) 0.083% IN NEBU: 2.5000 mg | INHALATION_SOLUTION | Freq: Four times a day (QID) | RESPIRATORY_TRACT | Status: DC | PRN

## 2022-08-13 MED ORDER — ENOXAPARIN SODIUM 40 MG/0.4ML IJ SOSY: 40.0000 mg | PREFILLED_SYRINGE | INTRAMUSCULAR | Status: DC

## 2022-08-13 MED ORDER — DIAZEPAM 5 MG/ML IJ SOLN
2.5000 mg | Freq: Once | INTRAMUSCULAR | Status: AC
  Administered 2022-08-13: 2.5 mg via INTRAVENOUS
  Filled 2022-08-13: qty 2

## 2022-08-13 MED ORDER — ONDANSETRON 4 MG PO TBDP
8.0000 mg | ORAL_TABLET | Freq: Three times a day (TID) | ORAL | Status: DC | PRN
  Administered 2022-08-15: 8 mg via ORAL
  Filled 2022-08-13: qty 2

## 2022-08-13 MED ORDER — FOLIC ACID 1 MG PO TABS
1.0000 mg | ORAL_TABLET | Freq: Every day | ORAL | Status: DC
  Administered 2022-08-13 – 2022-08-17 (×5): 1 mg via ORAL
  Filled 2022-08-13 (×5): qty 1

## 2022-08-13 MED ORDER — CLONAZEPAM 0.5 MG PO TABS: 0.5000 mg | ORAL_TABLET | Freq: Two times a day (BID) | ORAL | 0 refills | Status: DC

## 2022-08-13 MED ORDER — CLONAZEPAM 0.5 MG PO TABS
0.5000 mg | ORAL_TABLET | Freq: Two times a day (BID) | ORAL | Status: DC
  Administered 2022-08-13 – 2022-08-14 (×2): 0.5 mg via ORAL
  Filled 2022-08-13 (×2): qty 1

## 2022-08-13 MED ORDER — POTASSIUM CHLORIDE 20 MEQ PO PACK
40.0000 meq | PACK | Freq: Two times a day (BID) | ORAL | Status: AC
  Administered 2022-08-13 – 2022-08-14 (×2): 40 meq via ORAL
  Filled 2022-08-13 (×2): qty 2

## 2022-08-13 MED ORDER — SODIUM CHLORIDE 0.9% FLUSH
3.0000 mL | Freq: Two times a day (BID) | INTRAVENOUS | Status: DC
  Administered 2022-08-14 – 2022-08-17 (×8): 3 mL via INTRAVENOUS

## 2022-08-13 MED ORDER — TRAZODONE HCL 50 MG PO TABS
150.0000 mg | ORAL_TABLET | Freq: Every evening | ORAL | Status: DC | PRN
  Administered 2022-08-14 – 2022-08-16 (×3): 150 mg via ORAL
  Filled 2022-08-13 (×3): qty 1

## 2022-08-13 MED ORDER — ASPIRIN 325 MG PO TABS
325.0000 mg | ORAL_TABLET | Freq: Every day | ORAL | Status: DC
  Administered 2022-08-13 – 2022-08-15 (×3): 325 mg via ORAL
  Filled 2022-08-13 (×3): qty 1

## 2022-08-13 MED ORDER — PROCHLORPERAZINE EDISYLATE 10 MG/2ML IJ SOLN
10.0000 mg | Freq: Once | INTRAMUSCULAR | Status: AC
  Administered 2022-08-13: 10 mg via INTRAVENOUS
  Filled 2022-08-13: qty 2

## 2022-08-13 MED ORDER — PROCHLORPERAZINE MALEATE 10 MG PO TABS
10.0000 mg | ORAL_TABLET | Freq: Four times a day (QID) | ORAL | Status: DC | PRN
  Filled 2022-08-13: qty 1

## 2022-08-13 MED ORDER — SODIUM CHLORIDE 0.9 % IV SOLN: INTRAVENOUS | Status: DC

## 2022-08-13 MED ORDER — HYDROCODONE-ACETAMINOPHEN 5-325 MG PO TABS
1.0000 | ORAL_TABLET | ORAL | Status: DC | PRN
  Administered 2022-08-14 – 2022-08-16 (×4): 2 via ORAL
  Filled 2022-08-13 (×4): qty 2

## 2022-08-13 MED ORDER — IOHEXOL 350 MG/ML SOLN
75.0000 mL | Freq: Once | INTRAVENOUS | Status: AC | PRN
  Administered 2022-08-13: 75 mL via INTRAVENOUS

## 2022-08-13 MED ORDER — LACTATED RINGERS IV SOLN: INTRAVENOUS | Status: DC

## 2022-08-13 MED ORDER — STROKE: EARLY STAGES OF RECOVERY BOOK
Freq: Once | Status: AC
  Filled 2022-08-13: qty 1

## 2022-08-13 MED ORDER — ALBUTEROL SULFATE HFA 108 (90 BASE) MCG/ACT IN AERS: 2.0000 | INHALATION_SPRAY | Freq: Four times a day (QID) | RESPIRATORY_TRACT | Status: DC | PRN

## 2022-08-13 MED ORDER — SERTRALINE HCL 100 MG PO TABS
100.0000 mg | ORAL_TABLET | Freq: Two times a day (BID) | ORAL | Status: DC
  Administered 2022-08-13 – 2022-08-17 (×8): 100 mg via ORAL
  Filled 2022-08-13 (×3): qty 1
  Filled 2022-08-13: qty 2
  Filled 2022-08-13 (×4): qty 1

## 2022-08-13 MED ORDER — KETOROLAC TROMETHAMINE 15 MG/ML IJ SOLN
15.0000 mg | Freq: Once | INTRAMUSCULAR | Status: AC
  Administered 2022-08-13: 15 mg via INTRAVENOUS
  Filled 2022-08-13: qty 1

## 2022-08-13 MED ORDER — POTASSIUM CHLORIDE CRYS ER 20 MEQ PO TBCR
40.0000 meq | EXTENDED_RELEASE_TABLET | Freq: Once | ORAL | Status: AC
  Administered 2022-08-13: 40 meq via ORAL
  Filled 2022-08-13: qty 2

## 2022-08-13 MED ORDER — CLONAZEPAM 0.5 MG PO TABS: 0.5000 mg | ORAL_TABLET | Freq: Two times a day (BID) | ORAL | 1 refills | Status: DC

## 2022-08-13 MED ORDER — DIPHENHYDRAMINE HCL 50 MG/ML IJ SOLN
12.5000 mg | Freq: Once | INTRAMUSCULAR | Status: AC
  Administered 2022-08-13: 12.5 mg via INTRAVENOUS
  Filled 2022-08-13: qty 1

## 2022-08-13 NOTE — Telephone Encounter (Signed)
Pt called requesting a med refill for her ( ) I checked the last  filled on 2/20 by Dr Humphrey Rolls .Marland Kitchen And I told her to call the pharmacy because she has refills .Marland Kitchen Pt called back stating the pharmacy said she still has no refills ... I called the pharmacy  asked what was the problem  the pharmacist  explained that the meds was changed but they never got a  new Rx stating the Rx was changed and meds was to be taken differently ....  Due to no triage this AM  I went asked Dr Howie Ill to take the message    and relay the message to Dr Humphrey Rolls due to  I can't take Orders over the Phone only RN

## 2022-08-13 NOTE — Progress Notes (Signed)
Internal Medicine Clinic Attending  Case discussed with Dr. Khan  At the time of the visit.  We reviewed the resident's history and exam and pertinent patient test results.  I agree with the assessment, diagnosis, and plan of care documented in the resident's note.  
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
## 2022-08-13 NOTE — Progress Notes (Signed)
Patient with c/o left sided numbness and weakness since 1000. Husband appreciates altered smile. FAST assessment done. Mild difference in strength of left side as opposed to right side. Patient also c/o headache. Myriam Jacobson, [place] at bedside to assess patient. Dr Kathlene Cote also to bedside. Acute stroke symptoms not appreciated by PA Myriam Jacobson or Dr Kathlene Cote but patient to be evaluated in ED per [place].   Charge RN in ED notified of patient to arrive in ED.  Lesia Hausen, RN

## 2022-08-13 NOTE — ED Provider Notes (Signed)
[facility] Provider Note   CSN: [id number] Arrival date & time: 08/13/22  1317     History  Chief Complaint  Patient presents with   Headache   Numbness    Karen [name] is a 57 y.o. female.  57 year old female who presents with of subjective left-sided paresthesias which began this morning around 4 hours ago.  She has had a mild headache associated with this as well to.  Diagnosed recently with stage IV lung cancer.  No nausea or vomiting.  Has paresthesias to her left arm and left leg.  Patient has a spouse here who states that he thought maybe the left side of her face looked a little bit droopy.  No speech changes.  No prior history of same.  According to patient, she recently had a head CT approximately a month ago which did not show any signs of metastatic disease..  I reviewed the ER record to confirm this.  She denied any ataxia.       Home Medications Prior to Admission medications   Medication Sig Start Date End Date Taking? Authorizing Provider  albuterol (VENTOLIN HFA) 108 (90 Base) MCG/ACT inhaler Inhale 2 puffs into the lungs every 6 (six) hours as needed for wheezing or shortness of breath. 05/31/22   [name], MD  clonazePAM (KLONOPIN) 0.5 MG tablet Take 1 tablet (0.5 mg total) by mouth 2 (two) times daily. 08/13/22 10/12/22  [name], MD  dexamethasone (DECADRON) 4 MG tablet Take 1 tab 2 times daily starting day before pemetrexed. Then take 2 tabs daily x 3 days starting day after carboplatin. Take with food. 08/05/22   Brunetta Genera, MD  diclofenac Sodium (VOLTAREN) 1 % GEL Apply 4 g topically 4 (four) times daily. 08/04/22   Linus Galas, MD  folic acid (FOLVITE) 1 MG tablet Take 1 tablet (1 mg total) by mouth daily. Start 7 days before pemetrexed chemotherapy. Continue until 21 days after pemetrexed completed. 08/05/22   Brunetta Genera, MD  HYDROcodone-acetaminophen (NORCO/VICODIN)  5-325 MG tablet Take 1-2 tablets by mouth every 6 (six) hours as needed for moderate pain or severe pain. Do not drive after taking [id number]   Pickenpack-Cousar, Carlena Sax, NP  lidocaine (XYLOCAINE) 2 % solution Use as directed 15 mLs in the mouth or throat every 4 (four) hours as needed for mouth pain (sore throat). 08/04/22   Linus Galas, MD  lidocaine-prilocaine (EMLA) cream Apply to affected area once 08/05/22   Brunetta Genera, MD  ondansetron (ZOFRAN-ODT) 8 MG disintegrating tablet Take 1 tablet (8 mg total) by mouth every 8 (eight) hours as needed for nausea or vomiting. 08/04/22   Linus Galas, MD  prochlorperazine (COMPAZINE) 10 MG tablet Take 1 tablet (10 mg total) by mouth every 6 (six) hours as needed for refractory nausea / vomiting. 08/04/22   Linus Galas, MD  propranolol (INDERAL) 40 MG tablet Take 1 tablet (40 mg total) by mouth 2 (two) times daily. Patient not taking: Reported on 08/10/2022 08/04/22   Linus Galas, MD  sertraline (ZOLOFT) 100 MG tablet Take 100 mg by mouth 2 (two) times daily. 06/24/22   [provider]  SUMAtriptan (IMITREX) 50 MG tablet Take 1 tablet (50 mg total) by mouth once as needed for migraine. May repeat in 2 hours if headache persists or recurs. 08/04/22 08/05/23  Linus Galas, MD  traZODone (DESYREL) 100 MG tablet Take 150 mg by mouth at bedtime as needed. 06/24/22  [provider]  trimethobenzamide (TIGAN) 300 MG capsule Take 1 capsule (300 mg total) by mouth 3 (three) times daily as needed for nausea/vomiting. Tome 1 capsula (300 mg en total) por via oral 3 (tres) veces al dia segun sea necesario para las nauseas/vomitos. 08/12/22 08/12/23  [name], MD      Allergies    Percocet [oxycodone-acetaminophen], Hydromorphone hcl, and Oxycodone-acetaminophen    Review of Systems   Review of Systems  All other systems reviewed and are negative.   Physical Exam Updated Vital Signs BP 104/61  (BP Location: Right Arm)   Pulse 70   Temp 98.4 F (36.9 C) (Oral)   Resp 17   Ht 1.575 m ('5\' 2"'$ )   Wt 44.5 kg   LMP 02/08/2006   SpO2 94%   BMI 17.92 kg/m  Physical Exam Vitals and nursing note reviewed.  Constitutional:      General: She is not in acute distress.    Appearance: Normal appearance. She is well-developed. She is not toxic-appearing.  HENT:     Head: Normocephalic and atraumatic.  Eyes:     General: Lids are normal.     Conjunctiva/sclera: Conjunctivae normal.     Pupils: Pupils are equal, round, and reactive to light.  Neck:     Thyroid: No thyroid mass.     Trachea: No tracheal deviation.  Cardiovascular:     Rate and Rhythm: Normal rate and regular rhythm.     Heart sounds: Normal heart sounds. No murmur heard.    No gallop.  Pulmonary:     Effort: Pulmonary effort is normal. No respiratory distress.     Breath sounds: Normal breath sounds. No stridor. No decreased breath sounds, wheezing, rhonchi or rales.  Abdominal:     General: There is no distension.     Palpations: Abdomen is soft.     Tenderness: There is no abdominal tenderness. There is no rebound.  Musculoskeletal:        General: No tenderness. Normal range of motion.     Cervical back: Normal range of motion and neck supple.  Skin:    General: Skin is warm and dry.     Findings: No abrasion or rash.  Neurological:     General: No focal deficit present.     Mental Status: She is alert and oriented to person, place, and time. Mental status is at baseline.     GCS: GCS eye subscore is 4. GCS verbal subscore is 5. GCS motor subscore is 6.     Cranial Nerves: No cranial nerve deficit.     Sensory: No sensory deficit.     Motor: Motor function is intact.     Comments: Facial symmetry.  Speech is normal.  No drift noted.  Sensation intact in her face, extremities.  Psychiatric:        Attention and Perception: Attention normal.        Speech: Speech normal.        Behavior: Behavior normal.      ED Results / Procedures / Treatments   Labs (all labs ordered are listed, but only abnormal results are displayed) Labs Reviewed  CBC WITH DIFFERENTIAL/PLATELET  COMPREHENSIVE METABOLIC PANEL    EKG EKG Interpretation  Date/Time:  Friday August 13 2022 13:31:19 EST Ventricular Rate:  72 PR Interval:  125 QRS Duration: 79 QT Interval:  406 QTC Calculation: 445 R Axis:   94 Text Interpretation: Sinus rhythm Borderline right axis deviation Confirmed by Lacretia Leigh (  [id number]) on 08/13/2022 4:47:52 PM  Radiology No results found.  Procedures Procedures    Medications Ordered in ED Medications  0.9 %  sodium chloride infusion (has no administration in time range)    ED Course/ Medical Decision Making/ A&P                             Medical Decision Making Amount and/or Complexity of Data Reviewed Labs: ordered. Radiology: ordered. ECG/medicine tests: ordered.  Risk Prescription drug management.  The patient is EKG per interpretation shows normal sinus rhythm Patient symptoms concerning for complex migraine versus stroke.  Patient subsequently had MRI of her brain which showed tiny punctate acute stroke.  Discussed with neurology who recommends admission for further inpatient evaluation.  Will consult hospitalist for admission.  Interpreter used for this encounter        Final Clinical Impression(s) / ED Diagnoses Final diagnoses:  None    Rx / DC Orders ED Discharge Orders     None         Lacretia Leigh, MD 08/13/22 1649

## 2022-08-13 NOTE — Addendum Note (Signed)
Addended by: Edwyna Perfect on: 08/13/2022 12:16 PM   Modules accepted: Orders

## 2022-08-13 NOTE — Telephone Encounter (Signed)
Returned call from patient's spouse regarding some confusion with appointments. Patient and patient's spouse have been notified.

## 2022-08-13 NOTE — ED Notes (Signed)
[facility] called for pt transport

## 2022-08-13 NOTE — ED Notes (Signed)
Pt passed swallow screen

## 2022-08-13 NOTE — ED Triage Notes (Signed)
Pt brought to ER from short stay where she was to have a port a cath placed.  Pt with headache, left sided numbness and tingling and weakness.  Per husband pts smile looks different.  Pt had onset of symptoms at 10AM.  Pt was evaluated by radiologist in short stay and he felt she was not a code stroke but should be evaluated.  Dr. Zenia Resides notified of pt arrival and symptoms.

## 2022-08-13 NOTE — Progress Notes (Signed)
Corrected Klonopin order for 0.5 mg BID.

## 2022-08-13 NOTE — Progress Notes (Signed)
Interpreter used Eyvonne Mechanic [phone]) for translation services. Lesia Hausen, RN

## 2022-08-13 NOTE — Progress Notes (Addendum)
Patient was seen by Dr. Humphrey Rolls on February 20.  Klonopin was increased to twice daily dosing to help with nausea that she is experiencing.  Patient called clinic as she was unable to refill medication that was sent.  Pharmacist states that they did not receive an updated prescription to allow for new dispense. P: Initial RX printed due to pharmacy comments. Pharmacist requested comments as she was not comfortable with dispensing prescription.

## 2022-08-13 NOTE — Progress Notes (Signed)
Interventional Radiology Brief Note:  Patient presented to [facility] Radiology today for Port-A-Cath placement.  Upon arrival she complains of left-sided numbness and weakness in her face, hand, and leg.  Husband at bedside reports acute onset symptoms at 10am.  Physical exam performed with Spanish interpreter [phone] does not reveal notable discrepancy  in strength or sensation between R and L, however patient continues to endorse left-sided weakness and would like emergent evaluation in the ED.   Her most recent MR Brain 08/01/22 is negative.   Her Port-A-Cath placement will need to be rescheduled. Husband confirms chemo education scheduled for next week.  Will work towards placement in order to minimize treatment delay as able.   Brynda Greathouse, MS RD PA-C

## 2022-08-13 NOTE — Plan of Care (Addendum)
Patient presented with transient left-sided symptoms for which MRI brain was obtained which demonstrates punctate stroke on the left side of the brain.  Given the history of lung adenocarcinoma, I am concerned about possible stroke mechanism of hypercoagulable state of malignancy (with essentially multiple vascular distributions involved given symptoms localizing to the right side of the brain and asymptomatic stroke on imaging on the left side of the brain).   These are preliminary recommendations based on brief review of the chart and may be modified on full patient evaluation  # Punctate left cortical stroke, right sided TIA - Aspirin 325 mg x 1 dose, reach out to oncology for recommendations on anticoagulation for hypercoagulable state of malignancy - Given very very small size of stroke, risk of anticoagulation is outweighed by benefit of starting it - Stroke labs  HgbA1c, fasting lipid panel, goal less than 7% and goal LDL less than 70 respectively (may be completed outpatient) - CTA head and neck  - Frequent neuro checks - Echocardiogram only if oncology does not feel that she meets criteria for anticoagulation - Telemetry monitoring  - Blood pressure goal   - Normotension given very small size of stroke - PT consult, OT consult, Speech consult, not needed as patient is back to baseline - Stroke team to follow, admit to [facility] - Please notify neurology on patient's arrival to [facility]  These are preliminary recommendations based on brief review of the chart and may be modified on full patient evaluation  Lesleigh Noe MD-PhD [facility] [phone] Available 7 AM to 7 PM, outside these hours please contact Neurologist on call listed on AMION

## 2022-08-14 ENCOUNTER — Inpatient Hospital Stay (HOSPITAL_COMMUNITY): Payer: Commercial Managed Care - HMO

## 2022-08-14 DIAGNOSIS — I6389 Other cerebral infarction: Secondary | ICD-10-CM | POA: Diagnosis not present

## 2022-08-14 DIAGNOSIS — I639 Cerebral infarction, unspecified: Secondary | ICD-10-CM | POA: Diagnosis not present

## 2022-08-14 DIAGNOSIS — C3490 Malignant neoplasm of unspecified part of unspecified bronchus or lung: Secondary | ICD-10-CM

## 2022-08-14 DIAGNOSIS — G894 Chronic pain syndrome: Secondary | ICD-10-CM

## 2022-08-14 DIAGNOSIS — G43409 Hemiplegic migraine, not intractable, without status migrainosus: Secondary | ICD-10-CM | POA: Diagnosis not present

## 2022-08-14 DIAGNOSIS — F339 Major depressive disorder, recurrent, unspecified: Secondary | ICD-10-CM | POA: Diagnosis not present

## 2022-08-14 LAB — CBC
HCT: 35.8 % — ABNORMAL LOW (ref 36.0–46.0)
Hemoglobin: 11.9 g/dL — ABNORMAL LOW (ref 12.0–15.0)
MCH: 30.7 pg (ref 26.0–34.0)
MCHC: 33.2 g/dL (ref 30.0–36.0)
MCV: 92.5 fL (ref 80.0–100.0)
Platelets: 410 10*3/uL — ABNORMAL HIGH (ref 150–400)
RBC: 3.87 MIL/uL (ref 3.87–5.11)
RDW: 12.3 % (ref 11.5–15.5)
WBC: 11.9 10*3/uL — ABNORMAL HIGH (ref 4.0–10.5)
nRBC: 0 % (ref 0.0–0.2)

## 2022-08-14 LAB — LIPID PANEL
Cholesterol: 170 mg/dL (ref 0–200)
HDL: 33 mg/dL — ABNORMAL LOW (ref >40)
LDL Cholesterol: 95 mg/dL (ref 0–99)
Total CHOL/HDL Ratio: 5.2 RATIO
Triglycerides: 208 mg/dL — ABNORMAL HIGH (ref <150)
VLDL: 42 mg/dL — ABNORMAL HIGH (ref 0–40)

## 2022-08-14 LAB — BASIC METABOLIC PANEL
Anion gap: 6 (ref 5–15)
BUN: 17 mg/dL (ref 6–20)
CO2: 26 mmol/L (ref 22–32)
Calcium: 8.9 mg/dL (ref 8.9–10.3)
Chloride: 103 mmol/L (ref 98–111)
Creatinine, Ser: 0.77 mg/dL (ref 0.44–1.00)
GFR, Estimated: >60 mL/min (ref >60)
Glucose, Bld: 96 mg/dL (ref 70–99)
Potassium: 4.1 mmol/L (ref 3.5–5.1)
Sodium: 135 mmol/L (ref 135–145)

## 2022-08-14 LAB — ECHOCARDIOGRAM COMPLETE
AR max vel: 2.09 cm2
AV Area VTI: 2.21 cm2
AV Area mean vel: 2.02 cm2
AV Mean grad: 3 mmHg
AV Peak grad: 6.2 mmHg
Ao pk vel: 1.24 m/s
Area-P 1/2: 2.99 cm2
Height: 62 in
S' Lateral: 2.4 cm
Weight: 1568 oz

## 2022-08-14 LAB — HEMOGLOBIN A1C
Hgb A1c MFr Bld: 5.1 % (ref 4.8–5.6)
Mean Plasma Glucose: 99.67 mg/dL

## 2022-08-14 MED ORDER — ROSUVASTATIN CALCIUM 5 MG PO TABS
10.0000 mg | ORAL_TABLET | Freq: Every day | ORAL | Status: DC
  Administered 2022-08-14 – 2022-08-17 (×4): 10 mg via ORAL
  Filled 2022-08-14 (×4): qty 2

## 2022-08-14 MED ORDER — CLONAZEPAM 1 MG PO TABS
1.0000 mg | ORAL_TABLET | Freq: Two times a day (BID) | ORAL | Status: DC
  Administered 2022-08-14 – 2022-08-17 (×6): 1 mg via ORAL
  Filled 2022-08-14 (×6): qty 1

## 2022-08-14 NOTE — Plan of Care (Signed)
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
## 2022-08-14 NOTE — Evaluation (Signed)
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 PMH includes anxiety, HTN, chronic back pain, IBS, migraine, stage IV lung cancer   Clinical Impression   Pt reports independence at baseline with ADLs and functional mobility, pt lives with spouse in an apartment. Pt needing supervision-min A for ADLs, mod I for bed mobility and supervision for transfers without AD. Pt reports L sided numbness/weakness has improved since admission, but is still present, noted decreased sensation and fine motor coordination. Pt educated/provided with fine motor exercise handout (spanish version) and squeeze ball. Pt also with blurred vision, reads short paragraph without difficulty, reports it looks like items on the wall are "moving" when looking at a distance. Pt presenting with impairments listed below, will follow acutely. Recommend OP OT at d/c.     Recommendations for follow up therapy are one component of a multi-disciplinary discharge planning process, led by the attending physician.  Recommendations may be updated based on patient status, additional functional criteria and insurance authorization.   Follow Up Recommendations  Outpatient OT     Assistance Recommended at Discharge Set up Supervision/Assistance  Patient can return home with the following A little help with bathing/dressing/bathroom;Assistance with cooking/housework;Direct supervision/assist for medications management;Direct supervision/assist for financial management;Assist for transportation;Help with stairs or ramp for entrance    Functional Status Assessment  Patient has had a recent decline in their functional status and demonstrates the ability to make significant improvements in function in a  reasonable and predictable amount of time.  Equipment Recommendations  None recommended by OT    Recommendations for Other Services PT consult     Precautions / Restrictions Precautions Precautions: None Restrictions Weight Bearing Restrictions: No      Mobility Bed Mobility Overal bed mobility: Modified Independent                  Transfers Overall transfer level: Needs assistance Equipment used: None Transfers: Sit to/from Stand Sit to Stand: Supervision                  Balance Overall balance assessment: No apparent balance deficits (not formally assessed)                                         ADL either performed or assessed with clinical judgement   ADL Overall ADL's : Needs assistance/impaired Eating/Feeding: Modified independent   Grooming: Supervision/safety   Upper Body Bathing: Minimal assistance   Lower Body Bathing: Minimal assistance   Upper Body Dressing : Minimal assistance   Lower Body Dressing: Minimal assistance   Toilet Transfer: Ambulation;Regular Toilet;Supervision/safety   Toileting- Clothing Manipulation and Hygiene: Supervision/safety       Functional mobility during ADLs: Supervision/safety       Vision Baseline Vision/History: 1 Wears glasses Patient Visual Report: Blurring of vision Additional Comments: some blurred vision and reports it looks like things on the wall are "moving", able  to read short paragraph without difficulty with glasses off; will futher assess     Perception Perception Perception Tested?: No   Praxis Praxis Praxis tested?: Not tested    Pertinent Vitals/Pain Pain Assessment Pain Assessment: No/denies pain Faces Pain Scale: Hurts whole lot Pain Location: head  Pain Descriptors / Indicators: Discomfort, Grimacing Pain Intervention(s): Limited activity within patient's tolerance, Monitored during session, Repositioned     Hand Dominance Right   Extremity/Trunk  Assessment Upper Extremity Assessment Upper Extremity Assessment: LUE deficits/detail LUE Sensation: decreased light touch;decreased proprioception LUE Coordination: decreased fine motor   Lower Extremity Assessment Lower Extremity Assessment: Defer to PT evaluation (reports LLE numbness has improved since admission but is still present)   Cervical / Trunk Assessment Cervical / Trunk Assessment: Normal   Communication Communication Communication: Interpreter utilized;Prefers language other than English [employer]; [phone])   Cognition Arousal/Alertness: Awake/alert Behavior During Therapy: WFL for tasks assessed/performed Overall Cognitive Status: Within Functional Limits for tasks assessed                                 General Comments: per spouse in room, pt appears baseline, follows commands appropriately and provides PLOF appropriately     General Comments  VSS on RA    Exercises     Shoulder Instructions      Home Living Family/patient expects to be discharged to:: Private residence Living Arrangements: Spouse/significant other Available Help at Discharge: Family;Available 24 hours/day Type of Home: Apartment Home Access: Stairs to enter [employer] of Steps: 3 flights of steps Entrance Stairs-Rails: Right Home Layout: One level     Bathroom Shower/Tub: Teacher, early years/pre: Standard     Home Equipment: None          Prior Functioning/Environment Prior Level of Function : Independent/Modified Independent             Mobility Comments: ind ADLs Comments: ind        OT Problem List: Decreased strength;Decreased range of motion;Impaired sensation;Impaired vision/perception      OT Treatment/Interventions: Self-care/ADL training;Therapeutic exercise;Energy conservation;DME and/or AE instruction;Therapeutic activities;Patient/family education;Visual/perceptual remediation/compensation    OT Goals(Current goals  can be found in the care plan section) Acute Rehab OT Goals Patient Stated Goal: none stated OT Goal Formulation: With patient Time For Goal Achievement: 08/28/22 Potential to Achieve Goals: Good ADL Goals Pt Will Perform Lower Body Dressing: Independently;sit to/from stand;sitting/lateral leans Pt Will Perform Tub/Shower Transfer: Tub transfer;Shower transfer;Independently;ambulating Pt/caregiver will Perform Home Exercise Program: Increased ROM;Increased strength;Left upper extremity;With written HEP provided;With Supervision  OT Frequency: Min 2X/week    Co-evaluation              AM-PAC OT "6 Clicks" Daily Activity     Outcome Measure Help from another person eating meals?: None Help from another person taking care of personal grooming?: None Help from another person toileting, which includes using toliet, bedpan, or urinal?: A Little Help from another person bathing (including washing, rinsing, drying)?: A Little Help from another person to put on and taking off regular upper body clothing?: A Little Help from another person to put on and taking off regular lower body clothing?: A Little 6 Click Score: 20   End of Session Nurse Communication: Mobility status;Patient requests pain meds  Activity Tolerance: Patient tolerated treatment well Patient left: in bed;with call bell/phone within reach;with bed alarm set;with family/visitor present  OT Visit Diagnosis: Unsteadiness on feet (R26.81);Other abnormalities of gait and mobility (R26.89);Muscle weakness (generalized) (M62.81)                Time: [id number] OT Time Calculation (min): 26 min Charges:  OT General Charges $OT Visit: 1 Visit OT Evaluation $OT Eval Moderate Complexity: 1 Mod OT  Treatments $Self Care/Home Management : 8-22 mins  Renaye Rakers, OTD, OTR/L [employer] Preferred Acute Rehab [phone]   Ulla Gallo 08/14/2022, 2:31 PM

## 2022-08-14 NOTE — Evaluation (Signed)
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 PMH includes anxiety, HTN, chronic back pain, IBS, migraine, stage IV lung cancer  Clinical Impression  PTA, pt lives with her spouse in a 3rd level apartment and is independent. Pt reports min LLE numbness, however, is intact to light touch assessment. Pt ambulating 120 ft with no assistive device at a supervision level and negotiated stairs with min guard assist. Demonstrates dynamic instability and decreased endurance. Pt also reporting L thigh pain with ascending stairs. Would benefit from OPPT to address deficits and maximize functional mobility.     Recommendations for follow up therapy are one component of a multi-disciplinary discharge planning process, led by the attending physician.  Recommendations may be updated based on patient status, additional functional criteria and insurance authorization.  Follow Up Recommendations Outpatient PT      Assistance Recommended at Discharge PRN  Patient can return home with the following  Assistance with cooking/housework;Assist for transportation;Help with stairs or ramp for entrance    Equipment Recommendations None recommended by PT  Recommendations for Other Services       Functional Status Assessment Patient has had a recent decline in their functional status and demonstrates the ability to make significant improvements in function in a reasonable and predictable amount of time.     Precautions / Restrictions Precautions Precautions: Fall Precaution Comments: min fall risk Restrictions Weight Bearing Restrictions: No      Mobility  Bed Mobility Overal bed mobility: Independent                  Transfers Overall transfer level: Needs  assistance Equipment used: None Transfers: Sit to/from Stand Sit to Stand: Supervision                Ambulation/Gait Ambulation/Gait assistance: Supervision Gait Distance (Feet): 120 Feet Assistive device: None Gait Pattern/deviations: Step-through pattern, Decreased stride length, Drifts right/left, Narrow base of support       General Gait Details: Intermittently reaching for single UE support, min dynamic instability, supervision for safety  Stairs Stairs: Yes Stairs assistance: Min guard Stair Management: One rail Right Number of Stairs: 10 General stair comments: Cues for ascending with RLE due to L hip pain  Wheelchair Mobility    Modified Rankin (Stroke Patients Only)       Balance Overall balance assessment: Mild deficits observed, not formally tested                                           Pertinent Vitals/Pain Pain Assessment Pain Assessment: 0-10 Pain Score: 7  Pain Location: head, L thigh with hip flexion Pain Descriptors / Indicators: Discomfort, Grimacing, Sharp Pain Intervention(s): Limited activity within patient's tolerance, Monitored during session, Premedicated before session    Home Living Family/patient expects to be discharged to:: Private residence Living Arrangements: Spouse/significant other Available Help at Discharge: Family;Available 24 hours/day Type of Home: Apartment Home Access: Stairs to enter Entrance Stairs-Rails: Right Entrance Stairs-Number of Steps: 3 flights of steps   Home Layout: One level Home Equipment: None      Prior Function Prior Level of Function : Independent/Modified Independent;Driving  Mobility Comments: quit work one month ago       Hand Dominance   Dominant Hand: Right    Extremity/Trunk Assessment   Upper Extremity Assessment Upper Extremity Assessment: Defer to OT evaluation LUE Sensation: decreased light touch;decreased proprioception LUE  Coordination: decreased fine motor    Lower Extremity Assessment Lower Extremity Assessment: RLE deficits/detail;LLE deficits/detail RLE Deficits / Details: Strength 5/5 LLE Deficits / Details: Hip flexion 4/5, otherwise 5/5. Pt reports min numbness; intact to light touch upon assessment    Cervical / Trunk Assessment Cervical / Trunk Assessment: Normal  Communication   Communication: Interpreter utilized;Prefers language other than English [employer]; [phone])  Cognition Arousal/Alertness: Awake/alert Behavior During Therapy: WFL for tasks assessed/performed Overall Cognitive Status: Within Functional Limits for tasks assessed                                          General Comments General comments (skin integrity, edema, etc.): VSS on RA    Exercises     Assessment/Plan    PT Assessment Patient needs continued PT services  PT Problem List Decreased mobility;Decreased activity tolerance       PT Treatment Interventions Functional mobility training;Patient/family education;Gait training;Therapeutic activities;Stair training    PT Goals (Current goals can be found in the Care Plan section)  Acute Rehab PT Goals Patient Stated Goal: more energy PT Goal Formulation: With patient Time For Goal Achievement: 08/28/22 Potential to Achieve Goals: Good    Frequency Min 3X/week     Co-evaluation               AM-PAC PT "6 Clicks" Mobility  Outcome Measure Help needed turning from your back to your side while in a flat bed without using bedrails?: None Help needed moving from lying on your back to sitting on the side of a flat bed without using bedrails?: None Help needed moving to and from a bed to a chair (including a wheelchair)?: A Little Help needed standing up from a chair using your arms (e.g., wheelchair or bedside chair)?: A Little Help needed to walk in hospital room?: A Little Help needed climbing 3-5 steps with a railing? : A Little 6 Click  Score: 20    End of Session   Activity Tolerance: Patient tolerated treatment well Patient left: in bed;with call bell/phone within reach Nurse Communication: Mobility status PT Visit Diagnosis: Difficulty in walking, not elsewhere classified (R26.2);Unsteadiness on feet (R26.81)    Time: 1453-1510 PT Time Calculation (min) (ACUTE ONLY): 17 min   Charges:   PT Evaluation $PT Eval Low Complexity: [place], PT, DPT Acute Rehabilitation Services Office [phone]   Deno Etienne 08/14/2022, 3:34 PM
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
## 2022-08-14 NOTE — Progress Notes (Addendum)
STROKE TEAM PROGRESS NOTE   INTERVAL HISTORY No family at the bedside. She is awake and alert in NAD. Used Scientist, research (physical sciences). She states her symptoms have resolved except for some numbness of left arm and fact   Vitals:   08/14/22 0000 08/14/22 0134 08/14/22 0400 08/14/22 0821  BP: 117/77 121/65 124/72 113/61  Pulse: 84 77 79 86  Resp: '15 16 20 18  '$ Temp:  98.2 F (36.8 C) 98.3 F (36.8 C) 98.4 F (36.9 C)  TempSrc:  Oral Oral Oral  SpO2: 96% 97% 92% 94%  Weight:      Height:       CBC:  Recent Labs  Lab 08/13/22 1403 08/13/22 2214 08/14/22 0453  WBC 13.2* 13.3* 11.9*  NEUTROABS 9.7*  --   --   HGB 13.1 12.5 11.9*  HCT 38.8 37.7 35.8*  MCV 92.4 92.4 92.5  PLT 432* 430* 123XX123*   Basic Metabolic Panel:  Recent Labs  Lab 08/13/22 1403 08/13/22 2214 08/14/22 0453  NA 136  --  135  K 3.1*  --  4.1  CL 102  --  103  CO2 24  --  26  GLUCOSE 109*  --  96  BUN 19  --  17  CREATININE 0.67 0.86 0.77  CALCIUM 8.8*  --  8.9   Lipid Panel:  Recent Labs  Lab 08/14/22 0453  CHOL 170  TRIG 208*  HDL 33*  CHOLHDL 5.2  VLDL 42*  LDLCALC 95   HgbA1c:  Recent Labs  Lab 08/13/22 2215  HGBA1C 5.1   Urine Drug Screen: No results for input(s): "LABOPIA", "COCAINSCRNUR", "LABBENZ", "AMPHETMU", "THCU", "LABBARB" in the last 168 hours.  Alcohol Level No results for input(s): "ETH" in the last 168 hours.  IMAGING past 24 hours CT ANGIO HEAD NECK W WO CM  Result Date: 08/13/2022 CLINICAL DATA:  Stroke follow-up EXAM: CT ANGIOGRAPHY HEAD AND NECK TECHNIQUE: Multidetector CT imaging of the head and neck was performed using the standard protocol during bolus administration of intravenous contrast. Multiplanar CT image reconstructions and MIPs were obtained to evaluate the vascular anatomy. Carotid stenosis measurements (when applicable) are obtained utilizing NASCET criteria, using the distal internal carotid diameter as the denominator. RADIATION DOSE REDUCTION: This exam was  performed according to the departmental dose-optimization program which includes automated exposure control, adjustment of the mA and/or kV according to patient size and/or use of iterative reconstruction technique. CONTRAST:  49m OMNIPAQUE IOHEXOL 350 MG/ML SOLN COMPARISON:  07/01/2022 FINDINGS: CT HEAD FINDINGS Brain: There is no mass, hemorrhage or extra-axial collection. The size and configuration of the ventricles and extra-axial CSF spaces are normal. There is no acute or chronic infarction. The brain parenchyma is normal. Skull: The visualized skull base, calvarium and extracranial soft tissues are normal. Sinuses/Orbits: No fluid levels or advanced mucosal thickening of the visualized paranasal sinuses. No mastoid or middle ear effusion. The orbits are normal. CTA NECK FINDINGS SKELETON: There is no bony spinal canal stenosis. No lytic or blastic lesion. OTHER NECK: Normal pharynx, larynx and major salivary glands. No cervical lymphadenopathy. Unremarkable thyroid gland. UPPER CHEST: Masslike opacity at the right hilum measuring 3.4 x 2.1 cm. There is peribronchial thickening in the right lung apex. Small right pleural effusion. AORTIC ARCH: There is no calcific atherosclerosis of the aortic arch. There is no aneurysm, dissection or hemodynamically significant stenosis of the visualized portion of the aorta. Conventional 3 vessel aortic branching pattern. The visualized proximal subclavian arteries are widely patent. RIGHT CAROTID  SYSTEM: Normal without aneurysm, dissection or stenosis. LEFT CAROTID SYSTEM: Normal without aneurysm, dissection or stenosis. VERTEBRAL ARTERIES: Left dominant configuration. Both origins are clearly patent. There is no dissection, occlusion or flow-limiting stenosis to the skull base (V1-V3 segments). CTA HEAD FINDINGS POSTERIOR CIRCULATION: --Vertebral arteries: Normal V4 segments. --Inferior cerebellar arteries: Normal. --Basilar artery: Normal. --Superior cerebellar arteries:  Normal. --Posterior cerebral arteries (PCA): Normal. ANTERIOR CIRCULATION: --Intracranial internal carotid arteries: Normal. --Anterior cerebral arteries (ACA): Normal. Both A1 segments are present. Patent anterior communicating artery (a-comm). --Middle cerebral arteries (MCA): Normal. VENOUS SINUSES: As permitted by contrast timing, patent. ANATOMIC VARIANTS: None Review of the MIP images confirms the above findings. IMPRESSION: 1. No emergent large vessel occlusion or high-grade stenosis of the intracranial or cervical arteries. 2. Masslike opacity at the right hilum measuring 3.4 x 2.1 cm, likely confluent hilar malignant adenopathy. 3. Small right pleural effusion. Electronically Signed   By: Ulyses Jarred M.D.   On: 08/13/2022 23:51   MR Brain Wo Contrast (neuro protocol)  Result Date: 08/13/2022 CLINICAL DATA:  TIA EXAM: MRI HEAD WITHOUT CONTRAST TECHNIQUE: Multiplanar, multiecho pulse sequences of the brain and surrounding structures were obtained without intravenous contrast. COMPARISON:  MRI Brain 07/31/22 FINDINGS: Brain: Small punctate focus of acute infarct in the left parietal lobe (series 5, image 33). No hemorrhage. No hydrocephalus. No extra-axial fluid collection. There are multiple small scattered subcortical T2/FLAIR hyperintense lesions, which are favored to represent sequela of chronic microvascular ischemic change in appear unchanged compared to recent prior brain MRI. Vascular: Normal flow voids. Skull and upper cervical spine: Normal marrow signal. Sinuses/Orbits: Negative. Other: None. IMPRESSION: Small focus of diffusion restriction in the left parietal lobe is favored to represent a punctate acute infarct, new compared to 07/31/22. Given patient's history of malignancy a follow up MRI in 2-3 months to ensure resolution. Electronically Signed   By: Marin Roberts M.D.   On: 08/13/2022 15:59    PHYSICAL EXAM  Temp:  [98 F (36.7 C)-98.4 F (36.9 C)] 98.4 F (36.9 C) (02/24  0821) Pulse Rate:  [70-86] 86 (02/24 0821) Resp:  [15-20] 18 (02/24 0821) BP: (104-124)/(61-79) 113/61 (02/24 0821) SpO2:  [91 %-97 %] 94 % (02/24 0821) Weight:  [44.5 kg] 44.5 kg (02/23 1349)  General - Well nourished, well developed, in no apparent distress. Cardiovascular - Regular rhythm and rate.  Mental Status -  Level of arousal and orientation to time, place, and person were intact. Voice is hoarse  Language including expression, naming, repetition, comprehension was assessed and found intact. Attention span and concentration were normal. Recent and remote memory were intact. Fund of Knowledge was assessed and was intact.  Cranial Nerves II - XII - II - Visual field intact OU. III, IV, VI - Extraocular movements intact. V - left face decreased  VII - Facial movement intact bilaterally. VIII - Hearing & vestibular intact bilaterally. X - Palate elevates symmetrically. XI - Chin turning & shoulder shrug intact bilaterally. XII - Tongue protrusion intact.  Motor Strength - Right arm and right leg 5/5, left arm and left leg 4/5.  Bulk was normal and fasciculations were absent.   Motor Tone - Muscle tone was assessed at the neck and appendages and was normal.  Sensory - decreased on left arm     Coordination - The patient had normal movements in the hands and feet with no ataxia or dysmetria.  Tremor was absent.  Gait and Station - deferred.  ASSESSMENT/PLAN [name] is  a 57 y.o. female with history of  anxiety, depression, migraine, chronic pain, IBS, HTN metastatic lung adenocarcinoma (plan for treatment with Carboplatin, Alimta and Keytruda) who presented to the cancer center at [facility] on Friday for placement of a porta cath. When she arrived, she complained of left sided paresthesias and due to this the procedure was cancelled and she was sent to the [facility] ED for further evaluation. She stated that her symptoms had begun that morning with sensory numbness  involving her face, arm and leg on the left. The symptoms began with a feeling of heaviness and swelling (no actual swelling) to her left leg that then migrated to involve her arm and face. She also endorsed feeling weak on the left as well as lightheaded.   Stroke:  small acute left parietal ischemic infarct  Etiology:  unclear, possibly due to hypercoagulable state    CT head No acute abnormality.  CTA head & neck NO LVO  MRI  Small focus of diffusion restriction in the left parietal lobe  2D Echo ordered. Will likely need a TEE LDL 95 HgbA1c 5.1 VTE prophylaxis - Lovenox    Diet   Diet regular Room service appropriate? Yes; Fluid consistency: Thin   No antithrombotic prior to admission, now on aspirin 325 mg daily.  Therapy recommendations:  pending Disposition:  pending  Hypertension Home meds:  none Stable Permissive hypertension (OK if < 220/120) but gradually normalize in 5-7 days Long-term BP goal normotensive  Hyperlipidemia Home meds:  none LDL 95, goal < 70 Add Crestor '10mg'$    Continue statin at discharge  Other Stroke Risk Factors Migraines  Other Active Problems Primary adenocarcinoma of lung  Depression and Anxiety  Chronic pain  Anemia-HGB 11.9  Hospital day # 1  Beulah Gandy DNP, ACNPC-AG  [facility] ATTENDING ATTESTATION:  Dr. Reeves Forth evaluated pt independently, reviewed imaging, chart, labs. Discussed and formulated plan with the Resident/APP. Changes were made to the note where appropriate. Please see APP/resident note above for details.    57 year old with punctate stroke history adenocarcinoma.  On aspirin.  Echo is pending.  Patient expressed interest in going home and not staying for the entire workup.  Continue physical and Occupational Therapy.  Same-day note no charge.   Loa Socks, MD  To contact Stroke Continuity provider, please refer to [email or web address] After hours, contact General Neurology

## 2022-08-14 NOTE — Progress Notes (Signed)
PROGRESS NOTE    Karen [name]  [id number] DOB: 1965/10/10 DOA: 08/13/2022 PCP: Starlyn Skeans, MD   Brief Narrative:  57 y.o. female with medical history significant of anxiety, depression, migraine, chronic pain, IBS, HTN presented with left-sided paresthesias.  On presentation, MRI of brain showed acute parietal lobe infarct on the left.  Neurology was consulted.  Patient was transferred to [place]:   Small acute left parietal ischemic infarct: Unclear etiology at this time Hyperlipidemia -Currently on aspirin and statin.  Neurology following. -CTA head and neck showed no LVO.  Echo pending.  Continue telemetry monitoring. -LDL 95.  A1c 5.1 -PT/OT/SLP evaluation  Hypertension -Blood pressure currently on the lower side.  Allow for permissive hypertension  Primary adenocarcinoma of lung -She was supposed to have Port-A-Cath placement on 08/13/2022 before presenting to the ED.  This needs to be rescheduled as an outpatient and outpatient follow-up with oncology  Anxiety/depression -Continue Klonopin and sertraline and as needed trazodone  Chronic pain syndrome -Continue current pain management.  Follows up with palliative care as an outpatient  Vocal cord paralysis -Current.  Outpatient follow-up with ENT   DVT prophylaxis: Lovenox Code Status: Full Family Communication: None at bedside Disposition Plan: Status is: Inpatient Remains inpatient appropriate because: Of severity of illness  Consultants: Neurology  Procedures: None  Antimicrobials: None   Subjective: Patient seen and examined at bedside.  Communicated via Water engineer [phone].  Still feels weak but denies current headache, nausea or vomiting.  No fever or seizures reported.  Objective: Vitals:   08/14/22 0000 08/14/22 0134 08/14/22 0400 08/14/22 0821  BP: 117/77 121/65 124/72 113/61  Pulse: 84 77 79 86  Resp: '15 16 20 18  '$ Temp:  98.2 F (36.8 C)  98.3 F (36.8 C) 98.4 F (36.9 C)  TempSrc:  Oral Oral Oral  SpO2: 96% 97% 92% 94%  Weight:      Height:        Intake/Output Summary (Last 24 hours) at 08/14/2022 1051 Last data filed at 08/14/2022 0603 Gross per 24 hour  Intake 802.08 ml  Output 0 ml  Net 802.08 ml   Filed Weights   08/13/22 1349  Weight: 44.5 kg    Examination:  General exam: Appears calm and comfortable.  Looks thinly built.  On room air. Respiratory system: Bilateral decreased breath sounds at bases Cardiovascular system: S1 & S2 heard, Rate controlled Gastrointestinal system: Abdomen is nondistended, soft and nontender. Normal bowel sounds heard. Extremities: No cyanosis, clubbing, edema  Central nervous system: Alert and oriented.  Slow to respond.  Left-sided weakness present.   Skin: No rashes, lesions or ulcers Psychiatry: Flat affect.  Not agitated..     Data Reviewed: I have personally reviewed following labs and imaging studies  CBC: Recent Labs  Lab 08/13/22 1403 08/13/22 2214 08/14/22 0453  WBC 13.2* 13.3* 11.9*  NEUTROABS 9.7*  --   --   HGB 13.1 12.5 11.9*  HCT 38.8 37.7 35.8*  MCV 92.4 92.4 92.5  PLT 432* 430* 123XX123*   Basic Metabolic Panel: Recent Labs  Lab 08/10/22 1131 08/13/22 1403 08/13/22 2214 08/14/22 0453  NA 140 136  --  135  K 4.0 3.1*  --  4.1  CL 101 102  --  103  CO2 20 24  --  26  GLUCOSE 115* 109*  --  96  BUN 19 19  --  17  CREATININE 0.73 0.67 0.86 0.77  CALCIUM 9.4 8.8*  --  8.9   GFR: Estimated Creatinine Clearance: 55.2 mL/min (by C-G formula based on SCr of 0.77 mg/dL). Liver Function Tests: Recent Labs  Lab 08/13/22 1403  AST 16  ALT 15  ALKPHOS 115  BILITOT 0.6  PROT 6.9  ALBUMIN 3.6   No results for input(s): "LIPASE", "AMYLASE" in the last 168 hours. No results for input(s): "AMMONIA" in the last 168 hours. Coagulation Profile: No results for input(s): "INR", "PROTIME" in the last 168 hours. Cardiac Enzymes: No results for  input(s): "CKTOTAL", "CKMB", "CKMBINDEX", "TROPONINI" in the last 168 hours. BNP (last 3 results) No results for input(s): "PROBNP" in the last 8760 hours. HbA1C: Recent Labs    08/13/22 2215  HGBA1C 5.1   CBG: No results for input(s): "GLUCAP" in the last 168 hours. Lipid Profile: Recent Labs    08/14/22 0453  CHOL 170  HDL 33*  LDLCALC 95  TRIG 208*  CHOLHDL 5.2   Thyroid Function Tests: Recent Labs    08/13/22 2215  TSH 4.213   Anemia Panel: No results for input(s): "VITAMINB12", "FOLATE", "FERRITIN", "TIBC", "IRON", "RETICCTPCT" in the last 72 hours. Sepsis Labs: No results for input(s): "PROCALCITON", "LATICACIDVEN" in the last 168 hours.  No results found for this or any previous visit (from the past 240 hour(s)).       Radiology Studies: CT ANGIO HEAD NECK W WO CM  Result Date: 08/13/2022 CLINICAL DATA:  Stroke follow-up EXAM: CT ANGIOGRAPHY HEAD AND NECK TECHNIQUE: Multidetector CT imaging of the head and neck was performed using the standard protocol during bolus administration of intravenous contrast. Multiplanar CT image reconstructions and MIPs were obtained to evaluate the vascular anatomy. Carotid stenosis measurements (when applicable) are obtained utilizing NASCET criteria, using the distal internal carotid diameter as the denominator. RADIATION DOSE REDUCTION: This exam was performed according to the departmental dose-optimization program which includes automated exposure control, adjustment of the mA and/or kV according to patient size and/or use of iterative reconstruction technique. CONTRAST:  67m OMNIPAQUE IOHEXOL 350 MG/ML SOLN COMPARISON:  07/01/2022 FINDINGS: CT HEAD FINDINGS Brain: There is no mass, hemorrhage or extra-axial collection. The size and configuration of the ventricles and extra-axial CSF spaces are normal. There is no acute or chronic infarction. The brain parenchyma is normal. Skull: The visualized skull base, calvarium and extracranial  soft tissues are normal. Sinuses/Orbits: No fluid levels or advanced mucosal thickening of the visualized paranasal sinuses. No mastoid or middle ear effusion. The orbits are normal. CTA NECK FINDINGS SKELETON: There is no bony spinal canal stenosis. No lytic or blastic lesion. OTHER NECK: Normal pharynx, larynx and major salivary glands. No cervical lymphadenopathy. Unremarkable thyroid gland. UPPER CHEST: Masslike opacity at the right hilum measuring 3.4 x 2.1 cm. There is peribronchial thickening in the right lung apex. Small right pleural effusion. AORTIC ARCH: There is no calcific atherosclerosis of the aortic arch. There is no aneurysm, dissection or hemodynamically significant stenosis of the visualized portion of the aorta. Conventional 3 vessel aortic branching pattern. The visualized proximal subclavian arteries are widely patent. RIGHT CAROTID SYSTEM: Normal without aneurysm, dissection or stenosis. LEFT CAROTID SYSTEM: Normal without aneurysm, dissection or stenosis. VERTEBRAL ARTERIES: Left dominant configuration. Both origins are clearly patent. There is no dissection, occlusion or flow-limiting stenosis to the skull base (V1-V3 segments). CTA HEAD FINDINGS POSTERIOR CIRCULATION: --Vertebral arteries: Normal V4 segments. --Inferior cerebellar arteries: Normal. --Basilar artery: Normal. --Superior cerebellar arteries: Normal. --Posterior cerebral arteries (PCA): Normal. ANTERIOR CIRCULATION: --Intracranial internal carotid arteries: Normal. --Anterior cerebral arteries (ACA):  Normal. Both A1 segments are present. Patent anterior communicating artery (a-comm). --Middle cerebral arteries (MCA): Normal. VENOUS SINUSES: As permitted by contrast timing, patent. ANATOMIC VARIANTS: None Review of the MIP images confirms the above findings. IMPRESSION: 1. No emergent large vessel occlusion or high-grade stenosis of the intracranial or cervical arteries. 2. Masslike opacity at the right hilum measuring 3.4 x 2.1  cm, likely confluent hilar malignant adenopathy. 3. Small right pleural effusion. Electronically Signed   By: Ulyses Jarred M.D.   On: 08/13/2022 23:51   MR Brain Wo Contrast (neuro protocol)  Result Date: 08/13/2022 CLINICAL DATA:  TIA EXAM: MRI HEAD WITHOUT CONTRAST TECHNIQUE: Multiplanar, multiecho pulse sequences of the brain and surrounding structures were obtained without intravenous contrast. COMPARISON:  MRI Brain 07/31/22 FINDINGS: Brain: Small punctate focus of acute infarct in the left parietal lobe (series 5, image 33). No hemorrhage. No hydrocephalus. No extra-axial fluid collection. There are multiple small scattered subcortical T2/FLAIR hyperintense lesions, which are favored to represent sequela of chronic microvascular ischemic change in appear unchanged compared to recent prior brain MRI. Vascular: Normal flow voids. Skull and upper cervical spine: Normal marrow signal. Sinuses/Orbits: Negative. Other: None. IMPRESSION: Small focus of diffusion restriction in the left parietal lobe is favored to represent a punctate acute infarct, new compared to 07/31/22. Given patient's history of malignancy a follow up MRI in 2-3 months to ensure resolution. Electronically Signed   By: Marin Roberts M.D.   On: 08/13/2022 15:59        Scheduled Meds:  aspirin  300 mg Rectal Daily   Or   aspirin  325 mg Oral Daily   clonazePAM  0.5 mg Oral BID   enoxaparin (LOVENOX) injection  30 mg Subcutaneous [id number]   folic acid  1 mg Oral Daily   rosuvastatin  10 mg Oral Daily   sertraline  100 mg Oral BID   sodium chloride flush  3 mL Intravenous Q12H   Continuous Infusions:  sodium chloride            Aline August, MD [facility] 08/14/2022, 10:51 AM
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
## 2022-08-15 DIAGNOSIS — I639 Cerebral infarction, unspecified: Secondary | ICD-10-CM | POA: Diagnosis not present

## 2022-08-15 DIAGNOSIS — F32A Depression, unspecified: Secondary | ICD-10-CM | POA: Diagnosis present

## 2022-08-15 DIAGNOSIS — D649 Anemia, unspecified: Secondary | ICD-10-CM | POA: Diagnosis present

## 2022-08-15 MED ORDER — ADULT MULTIVITAMIN W/MINERALS CH
1.0000 | ORAL_TABLET | Freq: Every day | ORAL | Status: DC
  Administered 2022-08-15 – 2022-08-17 (×3): 1 via ORAL
  Filled 2022-08-15 (×3): qty 1

## 2022-08-15 MED ORDER — CLOPIDOGREL BISULFATE 75 MG PO TABS
75.0000 mg | ORAL_TABLET | Freq: Every day | ORAL | Status: DC
  Administered 2022-08-15 – 2022-08-17 (×3): 75 mg via ORAL
  Filled 2022-08-15 (×3): qty 1

## 2022-08-15 MED ORDER — HYDROCORTISONE (PERIANAL) 2.5 % EX CREA
TOPICAL_CREAM | Freq: Two times a day (BID) | CUTANEOUS | Status: DC
  Filled 2022-08-15 (×2): qty 28.35

## 2022-08-15 MED ORDER — ASPIRIN 81 MG PO TBEC
81.0000 mg | DELAYED_RELEASE_TABLET | Freq: Every day | ORAL | Status: DC
  Administered 2022-08-16 – 2022-08-17 (×2): 81 mg via ORAL
  Filled 2022-08-15 (×2): qty 1

## 2022-08-15 MED ORDER — ENSURE ENLIVE PO LIQD
237.0000 mL | Freq: Three times a day (TID) | ORAL | Status: DC
  Administered 2022-08-15 – 2022-08-17 (×6): 237 mL via ORAL

## 2022-08-15 NOTE — Progress Notes (Signed)
Initial Nutrition Assessment  DOCUMENTATION CODES:   Underweight  INTERVENTION:   -Ensure Enlive po TID, each supplement provides 350 kcal and 20 grams of protein -Magic cup TID with meals, each supplement provides 290 kcal and 9 grams of protein  -MVI with minerals daily  NUTRITION DIAGNOSIS:   Increased nutrient needs related to cancer and cancer related treatments as evidenced by estimated needs  GOAL:   Patient will meet greater than or equal to 90% of their needs  MONITOR:   PO intake, Supplement acceptance, Diet advancement  REASON FOR ASSESSMENT:   Malnutrition Screening Tool    ASSESSMENT:   Pt with medical history significant of anxiety, depression, migraine, chronic pain, IBS, HTN who presents after having paresthesias.  Pt admitted with lt cortical stoke.   2/24- s/p BSE- regular diet with nectar thick liquids   Pt unavailable at time of visit. Attempted to speak with pt via call to hospital room phone, however, unable to reach. RD unable to obtain further nutrition-related history or complete nutrition-focused physical exam at this time.    Pt has been referred to RD at [facility], but cancelled appointment per notes. Pt with stage IV lung cancer, undergoing treatment (was supposed to have port-a-cath placed on day of admission).   Pt currently on a regular diet with thin liquids. No meal completion data available to assess at this time.   Reviewed wt hx; pt has experienced a 14.9% wt loss over the past month, which is significant for time frame. Highly suspect pt with malnutrition, however, unable to identify at this time. Pt would greatly benefit from addition of oral nutrition supplements.   Acute rehab team recommending outpatient therapy services.   Medications reviewed and include folic acid.  Lab Results  Component Value Date   HGBA1C 5.1 08/13/2022   PTA DM medications are none.   Labs reviewed: CBGS: 102 (inpatient orders for  glycemic control are none).    Diet Order:   Diet Order             Diet regular Room service appropriate? Yes; Fluid consistency: Nectar Thick  Diet effective now                   EDUCATION NEEDS:   No education needs have been identified at this time  Skin:  Skin Assessment: Reviewed RN Assessment  Last BM:  08/14/22  Height:   Ht Readings from Last 1 Encounters:  08/13/22 '5\' 2"'$  (1.575 m)    Weight:   Wt Readings from Last 1 Encounters:  08/13/22 44.5 kg    Ideal Body Weight:  50 kg  BMI:  Body mass index is 17.92 kg/m.  Estimated Nutritional Needs:   Kcal:  [id number]  Protein:  95-110 grams  Fluid:  > 1.7 L    Loistine Chance, RD, LDN, [place] Registered Dietitian II Certified Diabetes Care and Education Specialist Please refer to [facility] for RD and/or RD on-call/weekend/after hours pager

## 2022-08-15 NOTE — Progress Notes (Addendum)
STROKE TEAM PROGRESS NOTE   INTERVAL HISTORY No family at the bedside. She is awake and alert in NAD. Used Scientist, research (physical sciences). She states she feels like she is back to normal. She tels me that she has a headache and nausea and vomiting this morning and she received some medications which helped the nausea and vomiting but still has her headache.  Patient is agreeable to stay in the hospital for TEE  Vitals:   08/14/22 2000 08/15/22 0000 08/15/22 0326 08/15/22 0752  BP: 110/60 (!) 104/59 (!) 100/47 119/71  Pulse: 78 88 91 94  Resp: '16 16 16 16  '$ Temp: 98.3 F (36.8 C) 98.4 F (36.9 C) 98.7 F (37.1 C) 98.1 F (36.7 C)  TempSrc: Oral Oral Oral Oral  SpO2: 98% 94% 96% 95%  Weight:      Height:       CBC:  Recent Labs  Lab 08/13/22 1403 08/13/22 2214 08/14/22 0453  WBC 13.2* 13.3* 11.9*  NEUTROABS 9.7*  --   --   HGB 13.1 12.5 11.9*  HCT 38.8 37.7 35.8*  MCV 92.4 92.4 92.5  PLT 432* 430* 410*    Basic Metabolic Panel:  Recent Labs  Lab 08/13/22 1403 08/13/22 2214 08/14/22 0453  NA 136  --  135  K 3.1*  --  4.1  CL 102  --  103  CO2 24  --  26  GLUCOSE 109*  --  96  BUN 19  --  17  CREATININE 0.67 0.86 0.77  CALCIUM 8.8*  --  8.9    Lipid Panel:  Recent Labs  Lab 08/14/22 0453  CHOL 170  TRIG 208*  HDL 33*  CHOLHDL 5.2  VLDL 42*  LDLCALC 95    HgbA1c:  Recent Labs  Lab 08/13/22 2215  HGBA1C 5.1    Urine Drug Screen: No results for input(s): "LABOPIA", "COCAINSCRNUR", "LABBENZ", "AMPHETMU", "THCU", "LABBARB" in the last 168 hours.  Alcohol Level No results for input(s): "ETH" in the last 168 hours.  IMAGING past 24 hours ECHOCARDIOGRAM COMPLETE  Result Date: 08/14/2022    ECHOCARDIOGRAM REPORT   Patient Name:   KARTIER MCQUIRE Date of Exam: 08/14/2022 Medical Rec #:  [id number]              Height:       62.0 in Accession #:    [id number]             Weight:       98.0 lb Date of Birth:  27-Jul-1965               BSA:          1.412 m Patient  Age:    57 years               BP:           134/68 mmHg Patient Gender: F                      HR:           74 bpm. Exam Location:  Inpatient Procedure: 2D Echo, Cardiac Doppler and Color Doppler Indications:    I63.9 Stroke  History:        Patient has prior history of Echocardiogram examinations, most                 recent 05/29/2021. Stroke; Risk Factors:Non-Smoker and  Hypertension.  Sonographer:    Wilkie Aye RVT RCS Referring Phys: [id number] EMILY B MULLEN  Sonographer Comments: Some images are off axis IMPRESSIONS  1. Left ventricular ejection fraction, by estimation, is 65 to 70%. The left ventricle has normal function. The left ventricle has no regional wall motion abnormalities. Left ventricular diastolic parameters are consistent with Grade I diastolic dysfunction (impaired relaxation).  2. Right ventricular systolic function is normal. The right ventricular size is normal.  3. Moderate pericardial effusion. The pericardial effusion is circumferential. There is no evidence of cardiac tamponade.  4. The mitral valve is normal in structure. Trivial mitral valve regurgitation.  5. The aortic valve is tricuspid. Aortic valve regurgitation is not visualized. No aortic stenosis is present.  6. The inferior vena cava is normal in size with greater than 50% respiratory variability, suggesting right atrial pressure of 3 mmHg. Comparison(s): Compared to prior TTE on 05/2021, a moderate sized pericardial effusion is now present. Conclusion(s)/Recommendation(s): No intracardiac source of embolism detected on this transthoracic study. Consider a transesophageal echocardiogram to exclude cardiac source of embolism if clinically indicated. FINDINGS  Left Ventricle: Left ventricular ejection fraction, by estimation, is 65 to 70%. The left ventricle has normal function. The left ventricle has no regional wall motion abnormalities. The left ventricular internal cavity size was normal in size. There is  no left  ventricular hypertrophy. Left ventricular diastolic parameters are consistent with Grade I diastolic dysfunction (impaired relaxation). Right Ventricle: The right ventricular size is normal. No increase in right ventricular wall thickness. Right ventricular systolic function is normal. Left Atrium: Left atrial size was normal in size. Right Atrium: Right atrial size was normal in size. Pericardium: A moderately sized pericardial effusion is present. The pericardial effusion is circumferential. There is no evidence of cardiac tamponade. Mitral Valve: The mitral valve is normal in structure. Trivial mitral valve regurgitation. Tricuspid Valve: The tricuspid valve is normal in structure. Tricuspid valve regurgitation is mild. Aortic Valve: The aortic valve is tricuspid. Aortic valve regurgitation is not visualized. No aortic stenosis is present. Aortic valve mean gradient measures 3.0 mmHg. Aortic valve peak gradient measures 6.2 mmHg. Aortic valve area, by VTI measures 2.21 cm. Pulmonic Valve: The pulmonic valve was normal in structure. Pulmonic valve regurgitation is trivial. Aorta: The aortic root is normal in size and structure. Venous: The inferior vena cava is normal in size with greater than 50% respiratory variability, suggesting right atrial pressure of 3 mmHg. IAS/Shunts: The atrial septum is grossly normal.  LEFT VENTRICLE PLAX 2D LVIDd:         3.90 cm   Diastology LVIDs:         2.40 cm   LV e' medial:    5.33 cm/s LV PW:         1.00 cm   LV E/e' medial:  11.0 LV IVS:        0.90 cm   LV e' lateral:   9.46 cm/s LVOT diam:     1.80 cm   LV E/e' lateral: 6.2 LV SV:         53 LV SV Index:   38 LVOT Area:     2.54 cm  RIGHT VENTRICLE             IVC RV S prime:     15.30 cm/s  IVC diam: 1.10 cm TAPSE (M-mode): 2.1 cm LEFT ATRIUM             Index  RIGHT ATRIUM          Index LA diam:        2.40 cm 1.70 cm/m   RA Area:     9.25 cm LA Vol (A2C):   21.7 ml 15.37 ml/m  RA Volume:   18.95 ml 13.42  ml/m LA Vol (A4C):   31.5 ml 22.31 ml/m LA Biplane Vol: 28.3 ml 20.05 ml/m  AORTIC VALVE                    PULMONIC VALVE AV Area (Vmax):    2.09 cm     PV Vmax:          0.77 m/s AV Area (Vmean):   2.02 cm     PV Peak grad:     2.4 mmHg AV Area (VTI):     2.21 cm     PR End Diast Vel: 6.86 msec AV Vmax:           124.00 cm/s AV Vmean:          83.300 cm/s AV VTI:            0.241 m AV Peak Grad:      6.2 mmHg AV Mean Grad:      3.0 mmHg LVOT Vmax:         102.00 cm/s LVOT Vmean:        66.000 cm/s LVOT VTI:          0.209 m LVOT/AV VTI ratio: 0.87  AORTA Ao Root diam: 2.60 cm Ao Asc diam:  2.60 cm MITRAL VALVE               TRICUSPID VALVE MV Area (PHT): 2.99 cm    TR Peak grad:   17.5 mmHg MV Decel Time: 254 msec    TR Vmax:        209.00 cm/s MV E velocity: 58.60 cm/s MV A velocity: 89.70 cm/s  SHUNTS MV E/A ratio:  0.65        Systemic VTI:  0.21 m                            Systemic Diam: 1.80 cm Gwyndolyn Kaufman MD Electronically signed by Gwyndolyn Kaufman MD Signature Date/Time: 08/14/2022/2:29:20 PM    Final     PHYSICAL EXAM  Temp:  [97.8 F (36.6 C)-98.7 F (37.1 C)] 98.1 F (36.7 C) (02/25 0752) Pulse Rate:  [75-94] 94 (02/25 0752) Resp:  [16-18] 16 (02/25 0752) BP: (100-126)/(47-71) 119/71 (02/25 0752) SpO2:  [94 %-98 %] 95 % (02/25 0752)  General - Well nourished, well developed, in no apparent distress. Cardiovascular - Regular rhythm and rate.  Mental Status -  Level of arousal and orientation to time, place, and person were intact. Voice is hoarse  Language including expression, naming, repetition, comprehension was assessed and found intact. Attention span and concentration were normal. Recent and remote memory were intact. Fund of Knowledge was assessed and was intact.  Cranial Nerves II - XII - II - Visual field intact OU. III, IV, VI - Extraocular movements intact. V - left face decreased  VII - Facial movement intact bilaterally. VIII - Hearing & vestibular  intact bilaterally. X - Palate elevates symmetrically. XI - Chin turning & shoulder shrug intact bilaterally. XII - Tongue protrusion intact.  Motor Strength - Right arm and right leg 5/5, left arm and left leg 4/5.  Bulk was normal and  fasciculations were absent.   Motor Tone - Muscle tone was assessed at the neck and appendages and was normal.  Sensory - decreased on left arm     Coordination - The patient had normal movements in the hands and feet with no ataxia or dysmetria.  Tremor was absent.  Gait and Station - deferred.  ASSESSMENT/PLAN Ms. Margaretta Cheff is a 57 y.o. female with history of  anxiety, depression, migraine, chronic pain, IBS, HTN metastatic lung adenocarcinoma (plan for treatment with Carboplatin, Alimta and Keytruda) who presented to the cancer center at [facility] on Friday for placement of a porta cath. When she arrived, she complained of left sided paresthesias and due to this the procedure was cancelled and she was sent to the [facility] ED for further evaluation. She stated that her symptoms had begun that morning with sensory numbness involving her face, arm and leg on the left. The symptoms began with a feeling of heaviness and swelling (no actual swelling) to her left leg that then migrated to involve her arm and face. She also endorsed feeling weak on the left as well as lightheaded.   Stroke:  small acute left parietal ischemic infarct  Etiology:  unclear, possibly due to hypercoagulable state    CT head No acute abnormality.  CTA head & neck NO LVO  MRI  Small focus of diffusion restriction in the left parietal lobe  2D Echo EF 65-70%. Left ventricular diastolic parameters are  consistent with Grade I diastolic dysfunction  Will need TEE, request sent  LDL 95 HgbA1c 5.1 VTE prophylaxis - Lovenox    Diet   Diet regular Room service appropriate? Yes; Fluid consistency: Nectar Thick   No antithrombotic prior to admission, now on aspirin 325 mg daily. And plavix  '75mg'$ . Therapy recommendations:  outpatient PT/OT  Disposition:  pending  Hypertension Home meds:  none Stable Permissive hypertension (OK if < 220/120) but gradually normalize in 5-7 days Long-term BP goal normotensive  Hyperlipidemia Home meds:  none LDL 95, goal < 70 con't Crestor '10mg'$    Continue statin at discharge  Other Stroke Risk Factors Migraines  Other Active Problems Primary adenocarcinoma of lung  Depression and Anxiety  Chronic pain  Anemia-HGB 11.9  Hospital day # 2  Beulah Gandy DNP, ACNPC-AG  [facility]    ATTENDING ATTESTATION:  57 year old with small stroke history adenocarcinoma.  Small left parietal CVA with TIA symptoms on the other side concerning for possible embolic etiology due to hypercoagulable state from cancer.  She is tearful today chaplain was consulted.  TEE still pending if positive may need anticoagulation.  DAPT therapy for now.  Attempted touch base with oncology however is difficult over the weekend.  Dr. Reeves Forth evaluated pt independently, reviewed imaging, chart, labs. Discussed and formulated plan with the Resident/APP. Changes were made to the note where appropriate. Please see APP/resident note above for details.     Autrey Human,MD    To contact Stroke Continuity provider, please refer to [email or web address] After hours, contact General Neurology

## 2022-08-15 NOTE — Progress Notes (Signed)
PROGRESS NOTE    Karen [name]  [id number] DOB: Aug 03, 1965 DOA: 08/13/2022 PCP: Karen Skeans, MD   Brief Narrative:  57 y.o. female with medical history significant of anxiety, depression, migraine, chronic pain, IBS, HTN presented with left-sided paresthesias.  On presentation, MRI of brain showed acute parietal lobe infarct on the left.  Neurology was consulted.  Patient was transferred to [place]:   Small acute left parietal ischemic infarct: Unclear etiology at this time Hyperlipidemia -Currently on aspirin and statin.  Neurology following. -CTA head and neck showed no LVO.  Echo showed EF of 60 to 65% with grade 1 diastolic dysfunction with moderate pericardial effusion.  Continue telemetry monitoring. -LDL 95.  A1c 5.1 -PT/OT/SLP evaluation  Moderate pericardial effusion -As seen on 2D echo.  No signs of tamponade.  Will consult cardiology.  Hypertension -Blood pressure currently on the lower side.  Allow for permissive hypertension  Primary adenocarcinoma of lung -She was supposed to have Port-A-Cath placement on 08/13/2022 before presenting to the ED.  This needs to be rescheduled as an outpatient and outpatient follow-up with oncology  Anxiety/depression -Continue Klonopin and sertraline and as needed trazodone  Chronic pain syndrome -Continue current pain management.  Follows up with palliative care as an outpatient  Vocal cord paralysis -Current.  Outpatient follow-up with ENT   DVT prophylaxis: Lovenox Code Status: Full Family Communication: None at bedside Disposition Plan: Status is: Inpatient Remains inpatient appropriate because: Of severity of illness  Consultants: Neurology  Procedures: None  Antimicrobials: None   Subjective: Patient seen and examined at bedside.  Complains of some headache and nausea.  No agitation, fever or seizures reported. Objective: Vitals:   08/14/22 2000 08/15/22 0000 08/15/22  0326 08/15/22 0752  BP: 110/60 (!) 104/59 (!) 100/47 119/71  Pulse: 78 88 91 94  Resp: '16 16 16 16  '$ Temp: 98.3 F (36.8 C) 98.4 F (36.9 C) 98.7 F (37.1 C) 98.1 F (36.7 C)  TempSrc: Oral Oral Oral Oral  SpO2: 98% 94% 96% 95%  Weight:      Height:       No intake or output data in the 24 hours ending 08/15/22 1132  Filed Weights   08/13/22 1349  Weight: 44.5 kg    Examination:  General: On room air.  No distress.  Looks chronically ill and deconditioned and thinly built. ENT/neck: No thyromegaly.  JVD is not elevated  respiratory: Decreased breath sounds at bases bilaterally with some crackles; no wheezing  CVS: S1-S2 heard, rate controlled currently Abdominal: Soft, nontender, slightly distended; no organomegaly, normal bowel sounds are heard Extremities: Trace lower extremity edema; no cyanosis  CNS: Awake and alert.  Slow to respond.  Poor historian.  Moving extremities.   Lymph: No obvious lymphadenopathy Skin: No obvious ecchymosis/lesions  psych: Mostly flat affect.  Not agitated currently.   Musculoskeletal: No obvious joint swelling/deformity     Data Reviewed: I have personally reviewed following labs and imaging studies  CBC: Recent Labs  Lab 08/13/22 1403 08/13/22 2214 08/14/22 0453  WBC 13.2* 13.3* 11.9*  NEUTROABS 9.7*  --   --   HGB 13.1 12.5 11.9*  HCT 38.8 37.7 35.8*  MCV 92.4 92.4 92.5  PLT 432* 430* 410*    Basic Metabolic Panel: Recent Labs  Lab 08/10/22 1131 08/13/22 1403 08/13/22 2214 08/14/22 0453  NA 140 136  --  135  K 4.0 3.1*  --  4.1  CL 101 102  --  103  CO2 20 24  --  26  GLUCOSE 115* 109*  --  96  BUN 19 19  --  17  CREATININE 0.73 0.67 0.86 0.77  CALCIUM 9.4 8.8*  --  8.9    GFR: Estimated Creatinine Clearance: 55.2 mL/min (by C-G formula based on SCr of 0.77 mg/dL). Liver Function Tests: Recent Labs  Lab 08/13/22 1403  AST 16  ALT 15  ALKPHOS 115  BILITOT 0.6  PROT 6.9  ALBUMIN 3.6    No results for  input(s): "LIPASE", "AMYLASE" in the last 168 hours. No results for input(s): "AMMONIA" in the last 168 hours. Coagulation Profile: No results for input(s): "INR", "PROTIME" in the last 168 hours. Cardiac Enzymes: No results for input(s): "CKTOTAL", "CKMB", "CKMBINDEX", "TROPONINI" in the last 168 hours. BNP (last 3 results) No results for input(s): "PROBNP" in the last 8760 hours. HbA1C: Recent Labs    08/13/22 2215  HGBA1C 5.1    CBG: No results for input(s): "GLUCAP" in the last 168 hours. Lipid Profile: Recent Labs    08/14/22 0453  CHOL 170  HDL 33*  LDLCALC 95  TRIG 208*  CHOLHDL 5.2    Thyroid Function Tests: Recent Labs    08/13/22 2215  TSH 4.213    Anemia Panel: No results for input(s): "VITAMINB12", "FOLATE", "FERRITIN", "TIBC", "IRON", "RETICCTPCT" in the last 72 hours. Sepsis Labs: No results for input(s): "PROCALCITON", "LATICACIDVEN" in the last 168 hours.  No results found for this or any previous visit (from the past 240 hour(s)).       Radiology Studies: ECHOCARDIOGRAM COMPLETE  Result Date: 08/14/2022    ECHOCARDIOGRAM REPORT   Patient Name:   Karen [name] Date of Exam: 08/14/2022 Medical Rec #:  [id number]              Height:       62.0 in Accession #:    [id number]             Weight:       98.0 lb Date of Birth:  1965/11/24               BSA:          1.412 m Patient Age:    57 years               BP:           134/68 mmHg Patient Gender: F                      HR:           74 bpm. Exam Location:  Inpatient Procedure: 2D Echo, Cardiac Doppler and Color Doppler Indications:    I63.9 Stroke  History:        Patient has prior history of Echocardiogram examinations, most                 recent 05/29/2021. Stroke; Risk Factors:Non-Smoker and                 Hypertension.  Sonographer:    Karen [name] RVT RCS Referring Phys: [id number] Karen [name]  Sonographer Comments: Some images are off axis IMPRESSIONS  1. Left ventricular ejection fraction, by  estimation, is 65 to 70%. The left ventricle has normal function. The left ventricle has no regional wall motion abnormalities. Left ventricular diastolic parameters are consistent with Grade I diastolic dysfunction (impaired relaxation).  2. Right ventricular systolic function is normal. The  right ventricular size is normal.  3. Moderate pericardial effusion. The pericardial effusion is circumferential. There is no evidence of cardiac tamponade.  4. The mitral valve is normal in structure. Trivial mitral valve regurgitation.  5. The aortic valve is tricuspid. Aortic valve regurgitation is not visualized. No aortic stenosis is present.  6. The inferior vena cava is normal in size with greater than 50% respiratory variability, suggesting right atrial pressure of 3 mmHg. Comparison(s): Compared to prior TTE on 05/2021, a moderate sized pericardial effusion is now present. Conclusion(s)/Recommendation(s): No intracardiac source of embolism detected on this transthoracic study. Consider a transesophageal echocardiogram to exclude cardiac source of embolism if clinically indicated. FINDINGS  Left Ventricle: Left ventricular ejection fraction, by estimation, is 65 to 70%. The left ventricle has normal function. The left ventricle has no regional wall motion abnormalities. The left ventricular internal cavity size was normal in size. There is  no left ventricular hypertrophy. Left ventricular diastolic parameters are consistent with Grade I diastolic dysfunction (impaired relaxation). Right Ventricle: The right ventricular size is normal. No increase in right ventricular wall thickness. Right ventricular systolic function is normal. Left Atrium: Left atrial size was normal in size. Right Atrium: Right atrial size was normal in size. Pericardium: A moderately sized pericardial effusion is present. The pericardial effusion is circumferential. There is no evidence of cardiac tamponade. Mitral Valve: The mitral valve is normal  in structure. Trivial mitral valve regurgitation. Tricuspid Valve: The tricuspid valve is normal in structure. Tricuspid valve regurgitation is mild. Aortic Valve: The aortic valve is tricuspid. Aortic valve regurgitation is not visualized. No aortic stenosis is present. Aortic valve mean gradient measures 3.0 mmHg. Aortic valve peak gradient measures 6.2 mmHg. Aortic valve area, by VTI measures 2.21 cm. Pulmonic Valve: The pulmonic valve was normal in structure. Pulmonic valve regurgitation is trivial. Aorta: The aortic root is normal in size and structure. Venous: The inferior vena cava is normal in size with greater than 50% respiratory variability, suggesting right atrial pressure of 3 mmHg. IAS/Shunts: The atrial septum is grossly normal.  LEFT VENTRICLE PLAX 2D LVIDd:         3.90 cm   Diastology LVIDs:         2.40 cm   LV e' medial:    5.33 cm/s LV PW:         1.00 cm   LV E/e' medial:  11.0 LV IVS:        0.90 cm   LV e' lateral:   9.46 cm/s LVOT diam:     1.80 cm   LV E/e' lateral: 6.2 LV SV:         53 LV SV Index:   38 LVOT Area:     2.54 cm  RIGHT VENTRICLE             IVC RV S prime:     15.30 cm/s  IVC diam: 1.10 cm TAPSE (M-mode): 2.1 cm LEFT ATRIUM             Index        RIGHT ATRIUM          Index LA diam:        2.40 cm 1.70 cm/m   RA Area:     9.25 cm LA Vol (A2C):   21.7 ml 15.37 ml/m  RA Volume:   18.95 ml 13.42 ml/m LA Vol (A4C):   31.5 ml 22.31 ml/m LA Biplane Vol: 28.3 ml 20.05 ml/m  AORTIC VALVE  PULMONIC VALVE AV Area (Vmax):    2.09 cm     PV Vmax:          0.77 m/s AV Area (Vmean):   2.02 cm     PV Peak grad:     2.4 mmHg AV Area (VTI):     2.21 cm     PR End Diast Vel: 6.86 msec AV Vmax:           124.00 cm/s AV Vmean:          83.300 cm/s AV VTI:            0.241 m AV Peak Grad:      6.2 mmHg AV Mean Grad:      3.0 mmHg LVOT Vmax:         102.00 cm/s LVOT Vmean:        66.000 cm/s LVOT VTI:          0.209 m LVOT/AV VTI ratio: 0.87  AORTA Ao Root diam:  2.60 cm Ao Asc diam:  2.60 cm MITRAL VALVE               TRICUSPID VALVE MV Area (PHT): 2.99 cm    TR Peak grad:   17.5 mmHg MV Decel Time: 254 msec    TR Vmax:        209.00 cm/s MV E velocity: 58.60 cm/s MV A velocity: 89.70 cm/s  SHUNTS MV E/A ratio:  0.65        Systemic VTI:  0.21 m                            Systemic Diam: 1.80 cm Gwyndolyn Kaufman MD Electronically signed by Gwyndolyn Kaufman MD Signature Date/Time: 08/14/2022/2:29:20 PM    Final    CT ANGIO HEAD NECK W WO CM  Result Date: 08/13/2022 CLINICAL DATA:  Stroke follow-up EXAM: CT ANGIOGRAPHY HEAD AND NECK TECHNIQUE: Multidetector CT imaging of the head and neck was performed using the standard protocol during bolus administration of intravenous contrast. Multiplanar CT image reconstructions and MIPs were obtained to evaluate the vascular anatomy. Carotid stenosis measurements (when applicable) are obtained utilizing NASCET criteria, using the distal internal carotid diameter as the denominator. RADIATION DOSE REDUCTION: This exam was performed according to the departmental dose-optimization program which includes automated exposure control, adjustment of the mA and/or kV according to patient size and/or use of iterative reconstruction technique. CONTRAST:  6m OMNIPAQUE IOHEXOL 350 MG/ML SOLN COMPARISON:  07/01/2022 FINDINGS: CT HEAD FINDINGS Brain: There is no mass, hemorrhage or extra-axial collection. The size and configuration of the ventricles and extra-axial CSF spaces are normal. There is no acute or chronic infarction. The brain parenchyma is normal. Skull: The visualized skull base, calvarium and extracranial soft tissues are normal. Sinuses/Orbits: No fluid levels or advanced mucosal thickening of the visualized paranasal sinuses. No mastoid or middle ear effusion. The orbits are normal. CTA NECK FINDINGS SKELETON: There is no bony spinal canal stenosis. No lytic or blastic lesion. OTHER NECK: Normal pharynx, larynx and major  salivary glands. No cervical lymphadenopathy. Unremarkable thyroid gland. UPPER CHEST: Masslike opacity at the right hilum measuring 3.4 x 2.1 cm. There is peribronchial thickening in the right lung apex. Small right pleural effusion. AORTIC ARCH: There is no calcific atherosclerosis of the aortic arch. There is no aneurysm, dissection or hemodynamically significant stenosis of the visualized portion of the aorta. Conventional 3 vessel aortic branching pattern. The visualized  proximal subclavian arteries are widely patent. RIGHT CAROTID SYSTEM: Normal without aneurysm, dissection or stenosis. LEFT CAROTID SYSTEM: Normal without aneurysm, dissection or stenosis. VERTEBRAL ARTERIES: Left dominant configuration. Both origins are clearly patent. There is no dissection, occlusion or flow-limiting stenosis to the skull base (V1-V3 segments). CTA HEAD FINDINGS POSTERIOR CIRCULATION: --Vertebral arteries: Normal V4 segments. --Inferior cerebellar arteries: Normal. --Basilar artery: Normal. --Superior cerebellar arteries: Normal. --Posterior cerebral arteries (PCA): Normal. ANTERIOR CIRCULATION: --Intracranial internal carotid arteries: Normal. --Anterior cerebral arteries (ACA): Normal. Both A1 segments are present. Patent anterior communicating artery (a-comm). --Middle cerebral arteries (MCA): Normal. VENOUS SINUSES: As permitted by contrast timing, patent. ANATOMIC VARIANTS: None Review of the MIP images confirms the above findings. IMPRESSION: 1. No emergent large vessel occlusion or high-grade stenosis of the intracranial or cervical arteries. 2. Masslike opacity at the right hilum measuring 3.4 x 2.1 cm, likely confluent hilar malignant adenopathy. 3. Small right pleural effusion. Electronically Signed   By: Ulyses Jarred M.D.   On: 08/13/2022 23:51   MR Brain Wo Contrast (neuro protocol)  Result Date: 08/13/2022 CLINICAL DATA:  TIA EXAM: MRI HEAD WITHOUT CONTRAST TECHNIQUE: Multiplanar, multiecho pulse sequences  of the brain and surrounding structures were obtained without intravenous contrast. COMPARISON:  MRI Brain 07/31/22 FINDINGS: Brain: Small punctate focus of acute infarct in the left parietal lobe (series 5, image 33). No hemorrhage. No hydrocephalus. No extra-axial fluid collection. There are multiple small scattered subcortical T2/FLAIR hyperintense lesions, which are favored to represent sequela of chronic microvascular ischemic change in appear unchanged compared to recent prior brain MRI. Vascular: Normal flow voids. Skull and upper cervical spine: Normal marrow signal. Sinuses/Orbits: Negative. Other: None. IMPRESSION: Small focus of diffusion restriction in the left parietal lobe is favored to represent a punctate acute infarct, new compared to 07/31/22. Given patient's history of malignancy a follow up MRI in 2-3 months to ensure resolution. Electronically Signed   By: Marin Roberts M.D.   On: 08/13/2022 15:59        Scheduled Meds:  aspirin  300 mg Rectal Daily   Or   aspirin  325 mg Oral Daily   clonazePAM  1 mg Oral BID   enoxaparin (LOVENOX) injection  30 mg Subcutaneous Q24H   feeding supplement  237 mL Oral TID BM   folic acid  1 mg Oral Daily   multivitamin with minerals  1 tablet Oral Daily   rosuvastatin  10 mg Oral Daily   sertraline  100 mg Oral BID   sodium chloride flush  3 mL Intravenous Q12H   Continuous Infusions:  sodium chloride            Aline August, MD [facility] 08/15/2022, 11:32 AM

## 2022-08-16 ENCOUNTER — Inpatient Hospital Stay: Payer: Commercial Managed Care - HMO

## 2022-08-16 DIAGNOSIS — I639 Cerebral infarction, unspecified: Secondary | ICD-10-CM | POA: Diagnosis not present

## 2022-08-16 DIAGNOSIS — I3131 Malignant pericardial effusion in diseases classified elsewhere: Secondary | ICD-10-CM

## 2022-08-16 MED ORDER — ENOXAPARIN SODIUM 40 MG/0.4ML IJ SOSY: 40.0000 mg | PREFILLED_SYRINGE | INTRAMUSCULAR | Status: DC

## 2022-08-16 MED ORDER — ENOXAPARIN SODIUM 30 MG/0.3ML IJ SOSY
30.0000 mg | PREFILLED_SYRINGE | INTRAMUSCULAR | Status: DC
  Administered 2022-08-16: 30 mg via SUBCUTANEOUS
  Filled 2022-08-16: qty 0.3

## 2022-08-16 MED ORDER — BENZONATATE 100 MG PO CAPS
100.0000 mg | ORAL_CAPSULE | Freq: Three times a day (TID) | ORAL | Status: DC | PRN
  Administered 2022-08-16: 100 mg via ORAL
  Filled 2022-08-16: qty 1

## 2022-08-16 MED ORDER — SODIUM CHLORIDE 0.9 % IV SOLN: INTRAVENOUS | Status: DC

## 2022-08-16 NOTE — Progress Notes (Signed)
STROKE TEAM PROGRESS NOTE   INTERVAL HISTORY No family at the bedside. She is awake and alert in NAD. Used video Spanish language interpreter. She states she feels like she is back to normal.  She was supposed to have a Port-A-Cath for her chemotherapy and oncologist is wondering if this can be done prior to discharge She presented with transient left face and body numbness which lasted daily few minutes and resolved.  MRI shows no definite stroke on the right but shows silent left parietal cortical infarct.  He has recent diagnosis of adenocarcinoma of the lung but has no history of DVT or PE Vitals:   08/16/22 0340 08/16/22 0808 08/16/22 1300 08/16/22 1652  BP: 105/68 116/69 112/61 136/71  Pulse: 97 97 87 92  Resp: '16 18 18 18  '$ Temp: 98.8 F (37.1 C) 98.2 F (36.8 C) 98.3 F (36.8 C) 98.2 F (36.8 C)  TempSrc: Oral Oral Oral Oral  SpO2: 90% 92%  95%  Weight:      Height:       CBC:  Recent Labs  Lab 08/13/22 1403 08/13/22 2214 08/14/22 0453  WBC 13.2* 13.3* 11.9*  NEUTROABS 9.7*  --   --   HGB 13.1 12.5 11.9*  HCT 38.8 37.7 35.8*  MCV 92.4 92.4 92.5  PLT 432* 430* [id number]*   Basic Metabolic Panel:  Recent Labs  Lab 08/13/22 1403 08/13/22 2214 08/14/22 0453  NA 136  --  135  K 3.1*  --  4.1  CL 102  --  103  CO2 24  --  26  GLUCOSE 109*  --  96  BUN 19  --  17  CREATININE 0.67 0.86 0.77  CALCIUM 8.8*  --  8.9   Lipid Panel:  Recent Labs  Lab 08/14/22 0453  CHOL 170  TRIG 208*  HDL 33*  CHOLHDL 5.2  VLDL 42*  LDLCALC 95   HgbA1c:  Recent Labs  Lab 08/13/22 2215  HGBA1C 5.1   Urine Drug Screen: No results for input(s): "LABOPIA", "COCAINSCRNUR", "LABBENZ", "AMPHETMU", "THCU", "LABBARB" in the last 168 hours.  Alcohol Level No results for input(s): "ETH" in the last 168 hours.  IMAGING past 24 hours No results found.  PHYSICAL EXAM  Temp:  [97.8 F (36.6 C)-98.8 F (37.1 C)] 98.2 F (36.8 C) (02/26 1652) Pulse Rate:  [69-97] 92 (02/26  1652) Resp:  [16-18] 18 (02/26 1652) BP: (105-136)/(61-96) 136/71 (02/26 1652) SpO2:  [90 %-95 %] 95 % (02/26 1652)  General - Well nourished, well developed pleasant middle-age Hispanic lady, in no apparent distress. Cardiovascular - Regular rhythm and rate.  Mental Status -  Level of arousal and orientation to time, place, and person were intact. Voice is hoarse  Language including expression, naming, repetition, comprehension was assessed and found intact. Attention span and concentration were normal. Recent and remote memory were intact. Fund of Knowledge was assessed and was intact.  Cranial Nerves II - XII - II - Visual field intact OU. III, IV, VI - Extraocular movements intact. V - left face decreased  VII - Facial movement intact bilaterally. VIII - Hearing & vestibular intact bilaterally. X - Palate elevates symmetrically. XI - Chin turning & shoulder shrug intact bilaterally. XII - Tongue protrusion intact.  Motor Strength - Right arm and right leg 5/5, left arm and left leg 4/5.  Bulk was normal and fasciculations were absent.   Motor Tone - Muscle tone was assessed at the neck and appendages and was normal.  Sensory - decreased on left arm     Coordination - The patient had normal movements in the hands and feet with no ataxia or dysmetria.  Tremor was absent.  Gait and Station - deferred.  ASSESSMENT/PLAN [name] is a 57 y.o. female with history of  anxiety, depression, migraine, chronic pain, IBS, HTN metastatic lung adenocarcinoma (plan for treatment with Carboplatin, Alimta and Keytruda) who presented to the cancer center at [facility] on Friday for placement of a porta cath. When she arrived, she complained of left sided paresthesias and due to this the procedure was cancelled and she was sent to the [facility] ED for further evaluation. She stated that her symptoms had begun that morning with sensory numbness involving her face, arm and leg on the left. The  symptoms began with a feeling of heaviness and swelling (no actual swelling) to her left leg that then migrated to involve her arm and face. She also endorsed feeling weak on the left as well as lightheaded.   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  Patient presented with right hemispheric TIA Etiology:  unclear, possibly due to hypercoagulable state   from lung cancer CT head No acute abnormality.  CTA head & neck NO LVO  MRI  Small focus of diffusion restriction in the left parietal lobe  2D Echo EF 65-70%. Left ventricular diastolic parameters are  consistent with Grade I diastolic dysfunction  Will need TEE, request sent  LDL 95 HgbA1c 5.1 VTE prophylaxis - Lovenox    Diet   Diet NPO time specified   Diet regular Room service appropriate? Yes; Fluid consistency: Nectar Thick   No antithrombotic prior to admission, now on aspirin 325 mg daily. And plavix '75mg'$ . Therapy recommendations:  outpatient PT/OT  Disposition:  pending  Hypertension Home meds:  none Stable Permissive hypertension (OK if < 220/120) but gradually normalize in 5-7 days Long-term BP goal normotensive  Hyperlipidemia Home meds:  none LDL 95, goal < 70 con't Crestor '10mg'$    Continue statin at discharge  Other Stroke Risk Factors Migraines  Other Active Problems Primary adenocarcinoma of lung  Depression and Anxiety  Chronic pain  Anemia-HGB 11.9  Hospital day # 3   I have personally obtained history,examined this patient, reviewed notes, independently viewed imaging studies, participated in medical decision making and plan of care.ROS completed by me personally and pertinent positives fully documented  I have made any additions or clarifications directly to the above note. Agree with note above.  Patient presented with right hemispheric TIA but MRI shows silent left parietal cortical infarct.  She has recent diagnosis of adenocarcinoma of the lung maintain  hypercoagulopathy or marantic endocarditis as strong possibilities.  Recommend dual antiplatelet therapy for 3 weeks followed by aspirin alone.  TEE can be checked as an outpatient if patient prefers not to stay in the hospital.  Oncologist recommends patient getting a Port-A-Cath while she is in the hospital to facilitate her chemotherapy to be started soon.  Long discussion with patient using Spanish language video interpreter and answered questions.  Greater than 50% time during this 50-minute visit was spent on counseling and coordination of care about discussion about her TIA and silent infarct and cancer related hypercoagulability discussion and answering questions  Antony Contras, MD Medical Director [place] Pager: [phone] 08/16/2022 5:04 PM    To contact Stroke Continuity provider, please refer to [email or web address] After hours, contact General Neurology

## 2022-08-16 NOTE — Consult Note (Signed)
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 antithrombotic management.    [facility] has been requested to perform a transesophageal echocardiogram on Karen [name] for stroke.  After careful review of history and examination, the risks and benefits of transesophageal echocardiogram have been explained including risks of esophageal damage, perforation (1:10,000 risk), bleeding, pharyngeal hematoma as well as other potential complications associated with conscious sedation including aspiration, arrhythmia, respiratory failure and death. Alternatives to treatment were discussed, questions were answered. Patient is willing to proceed.   Jabriel Vanduyne C. Oval Linsey, MD, [facility]  08/16/2022 5:25 PM

## 2022-08-16 NOTE — Progress Notes (Signed)
Oncology Short Note  Informed patient once admission from her IR Port-A-Cath placement appointment sent to the emergency room due to concerns for possible CVA. Shows small new lacunar infarct.  Neurology following and has the patient on dual antiplatelet therapy. It appears that she is nearly back to her baseline and is pending a TEE. If possible and okay per neurology please consider Port-A-Cath placement prior to discharge.  If not we will arrange for this as outpatient. She has scheduled follow-ups with [place] on 08/18/2022 to initiate her first cycle of palliative chemo immunotherapy can hopefully make it to this appointment due to significant burden of disease and likelihood of rapid progression. She is also seeing palliative care in the oncology clinic to help with her significant burden of emotional pain and distress and for symptom management. Other oncologic interventions planned in the hospital. She has appointments as noted below. Please call if any additional questions at this time.     Sullivan Lone MD MS
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
## 2022-08-16 NOTE — Progress Notes (Signed)
Physical Therapy Treatment Patient Details Name: Karen [name] MRN: [id number] DOB: [birth year] Today's Date: 08/16/2022   History of Present Illness Pt is a 57 y/o F presenting from short stay with headache, L numbness/weakness, MRI revealing L parietal lobe punctate infarct. PMH includes anxiety, HTN, chronic back pain, IBS, migraine, stage IV lung cancer    PT Comments    Pt progressing towards her physical therapy goals; reports improved LLE numbness, still with mild proximal weakness and thigh pain. Pt ambulating 60 ft x 2 with no assistive device at a supervision level. Worked on step ups and sit to stands for functional strengthening and endurance. Education provided regarding energy conservation techniques and activity progression.    Recommendations for follow up therapy are one component of a multi-disciplinary discharge planning process, led by the attending physician.  Recommendations may be updated based on patient status, additional functional criteria and insurance authorization.  Follow Up Recommendations  Outpatient PT     Assistance Recommended at Discharge PRN  Patient can return home with the following Assistance with cooking/housework;Assist for transportation;Help with stairs or ramp for entrance   Equipment Recommendations  None recommended by PT    Recommendations for Other Services       Precautions / Restrictions Precautions Precautions: Fall Precaution Comments: min fall risk Restrictions Weight Bearing Restrictions: No     Mobility  Bed Mobility Overal bed mobility: Independent                  Transfers Overall transfer level: Needs assistance Equipment used: None Transfers: Sit to/from Stand Sit to Stand: Supervision                Ambulation/Gait Ambulation/Gait assistance: Supervision Gait Distance (Feet): 120 Feet (60", 60") Assistive device: None Gait Pattern/deviations: Step-through pattern, Decreased stride  length, Drifts right/left, Narrow base of support       General Gait Details: improved gait speed and stability initially, Intermittently reaching for single UE support with fatigue,   Stairs Stairs: Yes Stairs assistance: Min guard Stair Management: Two rails Number of Stairs: 10 General stair comments: 5 reps x 2 sets forward step ups   Wheelchair Mobility    Modified Rankin (Stroke Patients Only) Modified Rankin (Stroke Patients Only) Pre-Morbid Rankin Score: No symptoms Modified Rankin: Moderately severe disability     Balance Overall balance assessment: Mild deficits observed, not formally tested                                          Cognition Arousal/Alertness: Awake/alert Behavior During Therapy: WFL for tasks assessed/performed Overall Cognitive Status: Within Functional Limits for tasks assessed                                          Exercises Other Exercises Other Exercises: x5 sit to stands, no hand support    General Comments        Pertinent Vitals/Pain Pain Assessment Pain Assessment: Faces Pain Location: L thigh with hip flexion Pain Descriptors / Indicators: Discomfort, Grimacing, Sharp Pain Intervention(s): Limited activity within patient's tolerance, Monitored during session    Home Living                          Prior Function  PT Goals (current goals can now be found in the care plan section) Acute Rehab PT Goals Patient Stated Goal: more energy PT Goal Formulation: With patient Time For Goal Achievement: 08/28/22 Potential to Achieve Goals: Good Progress towards PT goals: Progressing toward goals    Frequency    Min 3X/week      PT Plan Current plan remains appropriate    Co-evaluation              AM-PAC PT "6 Clicks" Mobility   Outcome Measure  Help needed turning from your back to your side while in a flat bed without using bedrails?: None Help  needed moving from lying on your back to sitting on the side of a flat bed without using bedrails?: None Help needed moving to and from a bed to a chair (including a wheelchair)?: A Little Help needed standing up from a chair using your arms (e.g., wheelchair or bedside chair)?: A Little Help needed to walk in hospital room?: A Little Help needed climbing 3-5 steps with a railing? : A Little 6 Click Score: 20    End of Session   Activity Tolerance: Patient tolerated treatment well Patient left: in bed;with call bell/phone within reach Nurse Communication: Mobility status PT Visit Diagnosis: Difficulty in walking, not elsewhere classified (R26.2);Unsteadiness on feet (R26.81)     Time: [id number] PT Time Calculation (min) (ACUTE ONLY): 22 min  Charges:  $Therapeutic Activity: 8-22 mins                     Wyona Almas, PT, DPT Acute Rehabilitation Services Office [phone]    Deno Etienne 08/16/2022, 10:47 AM

## 2022-08-16 NOTE — Progress Notes (Signed)
   08/16/22 1400  Spiritual Encounters  Type of Visit Initial  Care provided to: Patient  Referral source Patient request  Reason for visit Routine spiritual support  OnCall Visit No  Spiritual Framework  Needs/Challenges/Barriers pt needs a visa for her daughter so that she could have support  Patient Stress Factors Health changes  Interventions  Spiritual Care Interventions Made Compassionate presence;Reflective listening;Normalization of emotions;Prayer;Encouragement  Intervention Outcomes  Outcomes Reduced isolation   Ch responded to request for emotional and spiritual care. No family at bedside. Pt feels lonely and isolated. She doesn't have any family member in the area. Her daughter does not live in [place]. She would like to apply for a visa for her daughter but she does not have anyone to help her. Ch reduced pt's isolation by making pt aware of her health and resources. Ch will follow-up with pt in a couple of days.

## 2022-08-16 NOTE — Progress Notes (Signed)
Occupational Therapy Treatment Patient Details Name: Karen [name] MRN: [id number] DOB: February 09, 1966 Today's Date: 08/16/2022   History of present illness Pt is a 57 y/o F presenting from short stay with headache, L numbness/weakness, MRI revealing L parietal lobe punctate infarct. PMH includes anxiety, HTN, chronic back pain, IBS, migraine, stage IV lung cancer   OT comments  Patient received in supine and provided handout in spanish for energy conservation strategies. Handout reviewed with patient using interpretor. Patient independent with bed mobility and able to perform mobility and transfers with supervision to simulate shower transfers. Yellow therapy band provided to patient and instruction on LUE HEP with patient demonstrating good understanding. Discharge recommendations continue to be appropriate. Acute OT to continue to follow.    Recommendations for follow up therapy are one component of a multi-disciplinary discharge planning process, led by the attending physician.  Recommendations may be updated based on patient status, additional functional criteria and insurance authorization.    Follow Up Recommendations  Outpatient OT     Assistance Recommended at Discharge Set up Supervision/Assistance  Patient can return home with the following  A little help with bathing/dressing/bathroom;Assistance with cooking/housework;Direct supervision/assist for medications management;Direct supervision/assist for financial management;Assist for transportation;Help with stairs or ramp for entrance   Equipment Recommendations  None recommended by OT    Recommendations for Other Services      Precautions / Restrictions Precautions Precautions: Fall Precaution Comments: min fall risk Restrictions Weight Bearing Restrictions: No       Mobility Bed Mobility Overal bed mobility: Independent                  Transfers Overall transfer level: Needs assistance Equipment used:  None Transfers: Sit to/from Stand Sit to Stand: Supervision           General transfer comment: transfers in room to simulate shower transfer with supervision     Balance Overall balance assessment: Mild deficits observed, not formally tested                                         ADL either performed or assessed with clinical judgement   ADL Overall ADL's : Needs assistance/impaired                                 Tub/ Shower Transfer: English as a second language teacher Details (indicate cue type and reason): simulated   General ADL Comments: focused on energy conservation, transfers, and LUE HEP    Extremity/Trunk Assessment              Vision       Perception     Praxis      Cognition Arousal/Alertness: Awake/alert Behavior During Therapy: WFL for tasks assessed/performed Overall Cognitive Status: Within Functional Limits for tasks assessed                                          Exercises Exercises: General Upper Extremity General Exercises - Upper Extremity Shoulder Flexion: Strengthening, Left, 10 reps, Theraband Theraband Level (Shoulder Flexion): Level 1 (Yellow) Shoulder Horizontal ABduction: Strengthening, Both, 10 reps, Theraband Theraband Level (Shoulder Horizontal Abduction): Level 1 (Yellow) Elbow Flexion: Left, Strengthening, 15 reps, Theraband Theraband Level (Elbow Flexion): Level 1 (Yellow)  Elbow Extension: Strengthening, Left, 15 reps, Theraband Theraband Level (Elbow Extension): Level 1 (Yellow)    Shoulder Instructions       General Comments      Pertinent Vitals/ Pain       Pain Assessment Pain Assessment: Faces Faces Pain Scale: Hurts a little bit Pain Location: left thigh Pain Descriptors / Indicators: Discomfort, Grimacing Pain Intervention(s): Monitored during session, Repositioned  Home Living                                          Prior  Functioning/Environment              Frequency  Min 2X/week        Progress Toward Goals  OT Goals(current goals can now be found in the care plan section)  Progress towards OT goals: Progressing toward goals  Acute Rehab OT Goals Patient Stated Goal: go home OT Goal Formulation: With patient Time For Goal Achievement: 08/28/22 Potential to Achieve Goals: Good ADL Goals Pt Will Perform Lower Body Dressing: Independently;sit to/from stand;sitting/lateral leans Pt Will Perform Tub/Shower Transfer: Tub transfer;Shower transfer;Independently;ambulating Pt/caregiver will Perform Home Exercise Program: Increased ROM;Increased strength;Left upper extremity;With written HEP provided;With Supervision  Plan Discharge plan remains appropriate    Co-evaluation                 AM-PAC OT "6 Clicks" Daily Activity     Outcome Measure   Help from another person eating meals?: None Help from another person taking care of personal grooming?: None Help from another person toileting, which includes using toliet, bedpan, or urinal?: A Little Help from another person bathing (including washing, rinsing, drying)?: A Little Help from another person to put on and taking off regular upper body clothing?: A Little Help from another person to put on and taking off regular lower body clothing?: A Little 6 Click Score: 20    End of Session    OT Visit Diagnosis: Unsteadiness on feet (R26.81);Other abnormalities of gait and mobility (R26.89);Muscle weakness (generalized) (M62.81)   Activity Tolerance Patient tolerated treatment well   Patient Left in bed;with call bell/phone within reach   Nurse Communication Mobility status        Time: [id number] OT Time Calculation (min): 19 min  Charges: OT General Charges $OT Visit: 1 Visit OT Treatments $Therapeutic Exercise: 8-22 mins  Lodema Hong, [place]  Office [place] 08/16/2022,  2:11 PM

## 2022-08-16 NOTE — Progress Notes (Signed)
Subjective:   Hospital day: 3  Brief transfer history: [name] is a 57 year old with PMH of recently diagnosed metastatic lung adenocarcinoma (07/2022), HTN, migraines, IBS, HTN, PUD, and anxiety/depression who presented to the [facility] on 2/23 for Port-A-Cath placement.  She was sent to the ED for left-sided numbness, neurology was consulted and MRI brain showed a new acute left parietal lobe infarct. Patient was admitted to the hospitalist service. Patient's symptoms have now improved but currently pending TEE. Patient transferred from [place] to [facility] on 2/26 and IMTS called to assume care.   Overnight event: No acute events overnight  Interim History: Patient evaluated via video Spanish interpreter laying comfortably in bed. Patient reports that her numbness has resolved and feels back to her baseline but reports ongoing fatigue, loss of appetite and nausea. She also endorse occasional headaches. Discussed with patient plan for TEE tomorrow as well as possible placement of Port-A-Cath during this hospitalization.  Objective:  Vital signs in last 24 hours: Vitals:   08/15/22 1938 08/15/22 2347 08/16/22 0340 08/16/22 0808  BP: 132/89 (!) 117/96 105/68 116/69  Pulse: 92 69 97 97  Resp:  '16 16 18  '$ Temp: 98.6 F (37 C) 97.8 F (36.6 C) 98.8 F (37.1 C) 98.2 F (36.8 C)  TempSrc: Oral Oral Oral Oral  SpO2: 95% 94% 90% 92%  Weight:      Height:        Filed Weights   08/13/22 1349  Weight: 44.5 kg    No intake or output data in the 24 hours ending 08/16/22 0829 Net IO Since Admission: 1,042.08 mL [08/16/22 0829]  No results for input(s): "GLUCAP" in the last 72 hours.   Pertinent Labs:    Latest Ref Rng & Units 08/14/2022    4:53 AM 08/13/2022   10:14 PM 08/13/2022    2:03 PM  CBC  WBC 4.0 - 10.5 K/uL 11.9  13.3  13.2   Hemoglobin 12.0 - 15.0 g/dL 11.9  12.5  13.1   Hematocrit 36.0 - 46.0 % 35.8  37.7  38.8   Platelets 150 - 400  K/uL 410  430  432        Latest Ref Rng & Units 08/14/2022    4:53 AM 08/13/2022   10:14 PM 08/13/2022    2:03 PM  CMP  Glucose 70 - 99 mg/dL 96   109   BUN 6 - 20 mg/dL 17   19   Creatinine 0.44 - 1.00 mg/dL 0.77  0.86  0.67   Sodium 135 - 145 mmol/L 135   136   Potassium 3.5 - 5.1 mmol/L 4.1   3.1   Chloride 98 - 111 mmol/L 103   102   CO2 22 - 32 mmol/L 26   24   Calcium 8.9 - 10.3 mg/dL 8.9   8.8   Total Protein 6.5 - 8.1 g/dL   6.9   Total Bilirubin 0.3 - 1.2 mg/dL   0.6   Alkaline Phos 38 - 126 U/L   115   AST 15 - 41 U/L   16   ALT 0 - 44 U/L   15     Imaging: No results found.  Physical Exam  General: Pleasant, ill-appearing Spanish-speaking woman laying in bed. No acute distress. NEENT: [place]/AT. Dry mucous membrane. CV: RRR. No m/r/g. No LE edema Pulmonary: Lungs CTAB. Normal effort. No wheezing or rales. Decreased breath sounds at the bases. Abdominal: Soft, nontender, nondistended.  Normal bowel sounds. Extremities: 2+ distal pulses. Normal ROM. Skin: Warm and dry. No obvious rash or lesions. Neuro: A&Ox3. Moves all extremities. Strength 4/4 in all extremities. Normal sensation to gross touch.  No focal deficits. Psych: Depressed mood.   Assessment/Plan: Karen [name] is a 57 y.o. female with PMH of recently diagnosed metastatic lung adenocarcinoma (07/2022), HTN, migraines, IBS, HTN, PUD, and anxiety/depression who initially presented to the [facility] on 2/23 for Port-A-Cath placement and found to have acute ischemic stroke, transferred to [facility] with plan for TEE.   Principal Problem:   Acute ischemic stroke [facility]) Active Problems:   Anxiety   Major depression, recurrent, chronic (HCC)   Chronic pain syndrome   Migraine   Hyperlipidemia   Vocal cord paralysis   Primary adenocarcinoma of lung (HCC)   Malignant neoplasm of bone with metastases ([place])   Depression   Anemia  #Small acute left parietal ischemic infarct Etiology unclear  as patient's infarct does not account for her presenting symptoms. Neurology questions if this is possible tiny brain metastasis versus complicated migraine. Patient's symptoms have improved but will require TEE to complete workup before discharge. Patient denies any numbness or tingling during my evaluation today.  She continues to have generalized weakness. -Neurology following, appreciate recs -Cardiology consulted, pending TEE on 2/27 at 11:30 AM -Continue ASA 81 mg and Plavix 75 mg daily -Continue rosuvastatin 10 mg daily -Outpatient PT/OT  #Primary adenocarcinoma of the lung PET scan on 2/08 showed a hypermetabolic thoracic/lower cervical adenopathy and pulmonary nodularity favoring small cell lung cancer versus lymphoma as well as hypermetabolic osseous lesions consistent with metastatic disease. Biopsy confirmed adenocarcinoma of the lungs and patient was referred to oncology. Eval by oncology on 2/20 with plan to place a Port-A-Cath on 2/23 to initiate treatment with Carboplatin, Alimta and Keytruda. Patient is being followed by palliative care in the outpatient to help manage her cancer related symptoms. She reports ongoing fatigue, loss of appetite, nausea and occasional headache and vomiting. -Oncology team following peripherally, pending first chemo on 2/28 -IR consulted for placement of Port-A-Cath, recommend keeping n.p.o. at midnight -Norco 5-325 mg 1-2 tabs Q4h prn for pain -Toradol 15 mg q6hr prn for pain for 5 days (day 3/5) -PRN Zofran and Compazine for nausea/vomiting -Chaplain following for emotional and spiritual support, appreciate assistance  #Moderate pericardial effusion TTE on 2/24 showed EF 65-70%, G1DD, moderate pericardial effusion but no evidence of cardiac tamponade. Pericardial effusion likely secondary to recently diagnosed lung malignancy. Hemodynamically stable. Patient has decreased breath sounds on exam but no respiratory symptoms. -Cardiology consulted,  pending TEE  #Vocal cord paralysis Evaluated by ENT in December 2023 due to month-long history of hoarseness and found to have left true vocal cord paralysis. She was referred to SLP for evaluation and barium swallow study 1 month ago showed moderate dysphagia with low aspiration risk. Patient eval by SLP during this hospitalization and deemed mild aspiration risk. -Continue regular diet; nectar-thick liquids -Continue feeding supplements -Outpatient SLP follow-up  #Anxiety/depression Followed by Beverly Sessions in the outpatient. Symptoms exacerbated by recent diagnosis of metastatic lung adenocarcinoma. If patient's appetite continues to be poor, will consider initiating mirtazapine in the outpatient but will hold off for now as she will need close monitoring for possible serotonin syndrome. -Continue Klonopin 1 mg twice daily -Continue sertraline 100 mg twice daily -Continue trazodone 150 mg as needed at bedtime  #Migraine Patient continues to endorse occasional headaches during her hospitalization. -Consider headache cocktail as needed  Diet:  IVF: N/A VTE: Lovenox CODE: Full  Prior to Admission Living Arrangement: Home Anticipated Discharge Location: Home Barriers to Discharge: Pending TEE Dispo: Anticipated discharge in approximately 2-3 day(s).   Signed: Lacinda Axon, MD 08/16/2022, 8:29 AM  Pager: [phone] Internal Medicine Teaching Service After 5pm on weekdays and 1pm on weekends: On Call pager: [phone]

## 2022-08-16 NOTE — H&P (View-Only) (Signed)
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    [facility] has been requested to perform a transesophageal echocardiogram on Karen [name] for stroke.  After careful review of history and examination, the risks and benefits of transesophageal echocardiogram have been explained including risks of esophageal damage, perforation (1:10,000 risk), bleeding, pharyngeal hematoma as well as other potential complications associated with conscious sedation including aspiration, arrhythmia, respiratory failure and death. Alternatives to treatment were discussed, questions were answered. Patient is willing to proceed.   Karen [name] C. Oval Linsey, MD, [facility]  08/16/2022 5:25 PM

## 2022-08-17 ENCOUNTER — Inpatient Hospital Stay (HOSPITAL_COMMUNITY): Payer: Commercial Managed Care - HMO

## 2022-08-17 ENCOUNTER — Inpatient Hospital Stay (HOSPITAL_COMMUNITY): Payer: Commercial Managed Care - HMO | Admitting: Certified Registered"

## 2022-08-17 ENCOUNTER — Encounter (HOSPITAL_COMMUNITY)
Admission: EM | Disposition: A | Discharge status: Home / Self Care | Payer: Self-pay | Source: Home / Self Care | Attending: Internal Medicine

## 2022-08-17 ENCOUNTER — Other Ambulatory Visit: Payer: Self-pay | Admitting: Hematology

## 2022-08-17 ENCOUNTER — Ambulatory Visit: Payer: Commercial Managed Care - HMO | Admitting: Sports Medicine

## 2022-08-17 ENCOUNTER — Encounter (HOSPITAL_COMMUNITY): Payer: Self-pay | Admitting: Internal Medicine

## 2022-08-17 DIAGNOSIS — I639 Cerebral infarction, unspecified: Secondary | ICD-10-CM

## 2022-08-17 DIAGNOSIS — I361 Nonrheumatic tricuspid (valve) insufficiency: Secondary | ICD-10-CM | POA: Diagnosis not present

## 2022-08-17 DIAGNOSIS — I10 Essential (primary) hypertension: Secondary | ICD-10-CM

## 2022-08-17 DIAGNOSIS — C3431 Malignant neoplasm of lower lobe, right bronchus or lung: Secondary | ICD-10-CM

## 2022-08-17 DIAGNOSIS — I071 Rheumatic tricuspid insufficiency: Secondary | ICD-10-CM

## 2022-08-17 DIAGNOSIS — F418 Other specified anxiety disorders: Secondary | ICD-10-CM

## 2022-08-17 HISTORY — PX: TEE WITHOUT CARDIOVERSION: SHX5443

## 2022-08-17 HISTORY — PX: IR IMAGING GUIDED PORT INSERTION: IMG5740

## 2022-08-17 HISTORY — PX: BUBBLE STUDY: SHX6837

## 2022-08-17 LAB — ECHO TEE

## 2022-08-17 SURGERY — ECHOCARDIOGRAM, TRANSESOPHAGEAL
Anesthesia: Monitor Anesthesia Care

## 2022-08-17 MED ORDER — PROPOFOL 500 MG/50ML IV EMUL
INTRAVENOUS | Status: DC | PRN
  Administered 2022-08-17 (×2): 25 mg via INTRAVENOUS
  Administered 2022-08-17: 150 ug/kg/min via INTRAVENOUS

## 2022-08-17 MED ORDER — TRAZODONE HCL 150 MG PO TABS: 150.0000 mg | ORAL_TABLET | Freq: Every evening | ORAL | 0 refills | Status: DC | PRN

## 2022-08-17 MED ORDER — TETRACAINE HCL 1 % IJ SOLN
100.0000 mg | Freq: Once | INTRAMUSCULAR | Status: AC
  Administered 2022-08-17: 9 mL
  Filled 2022-08-17: qty 10

## 2022-08-17 MED ORDER — CLOPIDOGREL BISULFATE 75 MG PO TABS: 75.0000 mg | ORAL_TABLET | Freq: Every day | ORAL | 0 refills | Status: DC

## 2022-08-17 MED ORDER — ROSUVASTATIN CALCIUM 10 MG PO TABS: 10.0000 mg | ORAL_TABLET | Freq: Every day | ORAL | 2 refills | Status: DC

## 2022-08-17 MED ORDER — MIDAZOLAM HCL 2 MG/2ML IJ SOLN
INTRAMUSCULAR | Status: AC
  Filled 2022-08-17: qty 2

## 2022-08-17 MED ORDER — HEPARIN SOD (PORK) LOCK FLUSH 100 UNIT/ML IV SOLN
INTRAVENOUS | Status: AC
  Administered 2022-08-17: 500 [IU]
  Filled 2022-08-17: qty 5

## 2022-08-17 MED ORDER — CLONAZEPAM 1 MG PO TABS: 1.0000 mg | ORAL_TABLET | Freq: Two times a day (BID) | ORAL | 0 refills | Status: DC

## 2022-08-17 MED ORDER — MIDAZOLAM HCL 2 MG/2ML IJ SOLN
INTRAMUSCULAR | Status: AC | PRN
  Administered 2022-08-17: .5 mg via INTRAVENOUS
  Administered 2022-08-17: 1 mg via INTRAVENOUS
  Administered 2022-08-17: .5 mg via INTRAVENOUS

## 2022-08-17 MED ORDER — FENTANYL CITRATE (PF) 100 MCG/2ML IJ SOLN
INTRAMUSCULAR | Status: AC
  Filled 2022-08-17: qty 2

## 2022-08-17 MED ORDER — FENTANYL CITRATE (PF) 100 MCG/2ML IJ SOLN
INTRAMUSCULAR | Status: AC | PRN
  Administered 2022-08-17 (×2): 50 ug via INTRAVENOUS

## 2022-08-17 MED ORDER — ASPIRIN 81 MG PO TBEC: 81.0000 mg | DELAYED_RELEASE_TABLET | Freq: Every day | ORAL | 2 refills | Status: DC

## 2022-08-17 MED FILL — Fosaprepitant Dimeglumine For IV Infusion 150 MG (Base Eq): INTRAVENOUS | Qty: 5 | Status: AC

## 2022-08-17 MED FILL — Dexamethasone Sodium Phosphate Inj 100 MG/10ML: INTRAMUSCULAR | Qty: 1 | Status: AC

## 2022-08-17 NOTE — Progress Notes (Signed)
Patient received from IR, Vital signs stable

## 2022-08-17 NOTE — Plan of Care (Signed)
  Problem: Education: Goal: Knowledge of disease or condition will improve Outcome: Progressing   Problem: Coping: Goal: Will verbalize positive feelings about self Outcome: Progressing

## 2022-08-17 NOTE — Procedures (Signed)
Interventional Radiology Procedure Note  Date of Procedure: 08/17/2022  Procedure: Port placement   Findings:  1. Right chest port placement via right IJ    Complications: No immediate complications noted.   Estimated Blood Loss: minimal  Follow-up and Recommendations: 1. Ready for use    Albin Felling, MD  Vascular & Interventional Radiology  08/17/2022 4:46 PM

## 2022-08-17 NOTE — Plan of Care (Signed)
  Problem: Education: Goal: Knowledge of disease or condition will improve Outcome: Adequate for Discharge Goal: Knowledge of secondary prevention will improve (MUST DOCUMENT ALL) Outcome: Adequate for Discharge Goal: Knowledge of patient specific risk factors will improve Elta [place] N/A or DELETE if not current risk factor) Outcome: Adequate for Discharge   Problem: Ischemic Stroke/TIA Tissue Perfusion: Goal: Complications of ischemic stroke/TIA will be minimized Outcome: Adequate for Discharge   Problem: Coping: Goal: Will verbalize positive feelings about self Outcome: Adequate for Discharge Goal: Will identify appropriate support needs Outcome: Adequate for Discharge   Problem: Health Behavior/Discharge Planning: Goal: Ability to manage health-related needs will improve Outcome: Adequate for Discharge Goal: Goals will be collaboratively established with patient/family Outcome: Adequate for Discharge   Problem: Self-Care: Goal: Ability to participate in self-care as condition permits will improve Outcome: Adequate for Discharge Goal: Verbalization of feelings and concerns over difficulty with self-care will improve Outcome: Adequate for Discharge Goal: Ability to communicate needs accurately will improve Outcome: Adequate for Discharge   Problem: Nutrition: Goal: Risk of aspiration will decrease Outcome: Adequate for Discharge Goal: Dietary intake will improve Outcome: Adequate for Discharge   Problem: Education: Goal: Knowledge of General Education information will improve Description: Including pain rating scale, medication(s)/side effects and non-pharmacologic comfort measures Outcome: Adequate for Discharge   Problem: Health Behavior/Discharge Planning: Goal: Ability to manage health-related needs will improve Outcome: Adequate for Discharge   Problem: Clinical Measurements: Goal: Ability to maintain clinical measurements within normal limits will improve Outcome:  Adequate for Discharge Goal: Will remain free from infection Outcome: Adequate for Discharge Goal: Diagnostic test results will improve Outcome: Adequate for Discharge Goal: Respiratory complications will improve Outcome: Adequate for Discharge Goal: Cardiovascular complication will be avoided Outcome: Adequate for Discharge   Problem: Activity: Goal: Risk for activity intolerance will decrease Outcome: Adequate for Discharge   Problem: Nutrition: Goal: Adequate nutrition will be maintained Outcome: Adequate for Discharge   Problem: Coping: Goal: Level of anxiety will decrease Outcome: Adequate for Discharge   Problem: Elimination: Goal: Will not experience complications related to bowel motility Outcome: Adequate for Discharge Goal: Will not experience complications related to urinary retention Outcome: Adequate for Discharge   Problem: Pain Managment: Goal: General experience of comfort will improve Outcome: Adequate for Discharge   Problem: Safety: Goal: Ability to remain free from injury will improve Outcome: Adequate for Discharge   Problem: Skin Integrity: Goal: Risk for impaired skin integrity will decrease Outcome: Adequate for Discharge

## 2022-08-17 NOTE — Interval H&P Note (Signed)
History and Physical Interval Note:  08/17/2022 12:07 PM  Karen [name]  has presented today for surgery, with the diagnosis of stroke.  The various methods of treatment have been discussed with the patient and family. After consideration of risks, benefits and other options for treatment, the patient has consented to  Procedure(s): TRANSESOPHAGEAL ECHOCARDIOGRAM (TEE) (N/A) as a surgical intervention.  The patient's history has been reviewed, patient examined, no change in status, stable for surgery.  I have reviewed the patient's chart and labs.  Questions were answered to the patient's satisfaction.     Donato Heinz

## 2022-08-17 NOTE — Progress Notes (Signed)
STROKE TEAM PROGRESS NOTE   INTERVAL HISTORY No family at the bedside. She is awake and alert in NAD.  She appears emotionally upset and is crying.  She is scheduled to undergo Port-A-Cath by interventional radiology in IR before DC. Patient previously had MRI scan of the brain with and without contrast on 07/31/2022 as part of cancer screening which had shown no evidence of metastasis and present MRI on 08/13/2022 was done without contrast but we will not repeat postcontrast MRI at this time the previous one was done barely 2 weeks ago.  Oncology plan to start chemotherapy after Port-A-Cath insertion. Vitals:   08/16/22 1949 08/17/22 0014 08/17/22 0419 08/17/22 0724  BP: 105/71 (!) 106/54 (!) 138/110 125/68  Pulse: 87 83 91 99  Resp: '17 17 19 18  '$ Temp: 98.1 F (36.7 C) 98 F (36.7 C) 98.1 F (36.7 C) 98.5 F (36.9 C)  TempSrc: Oral Oral Oral Oral  SpO2: (!) 88% 92% 92% 94%  Weight:      Height:       CBC:  Recent Labs  Lab 08/13/22 1403 08/13/22 2214 08/14/22 0453  WBC 13.2* 13.3* 11.9*  NEUTROABS 9.7*  --   --   HGB 13.1 12.5 11.9*  HCT 38.8 37.7 35.8*  MCV 92.4 92.4 92.5  PLT 432* 430* [id number]*   Basic Metabolic Panel:  Recent Labs  Lab 08/13/22 1403 08/13/22 2214 08/14/22 0453  NA 136  --  135  K 3.1*  --  4.1  CL 102  --  103  CO2 24  --  26  GLUCOSE 109*  --  96  BUN 19  --  17  CREATININE 0.67 0.86 0.77  CALCIUM 8.8*  --  8.9   Lipid Panel:  Recent Labs  Lab 08/14/22 0453  CHOL 170  TRIG 208*  HDL 33*  CHOLHDL 5.2  VLDL 42*  LDLCALC 95   HgbA1c:  Recent Labs  Lab 08/13/22 2215  HGBA1C 5.1   Urine Drug Screen: No results for input(s): "LABOPIA", "COCAINSCRNUR", "LABBENZ", "AMPHETMU", "THCU", "LABBARB" in the last 168 hours.  Alcohol Level No results for input(s): "ETH" in the last 168 hours.  IMAGING past 24 hours No results found.  PHYSICAL EXAM  Temp:  [98 F (36.7 C)-98.5 F (36.9 C)] 98.5 F (36.9 C) (02/27 0724) Pulse Rate:  [83-99] 99  (02/27 0724) Resp:  [17-19] 18 (02/27 0724) BP: (105-138)/(54-110) 125/68 (02/27 0724) SpO2:  [88 %-95 %] 94 % (02/27 0724)  General - Well nourished, well developed pleasant middle-age Hispanic lady, in no apparent distress. Cardiovascular - Regular rhythm and rate.  Mental Status -  Level of arousal and orientation to time, place, and person were intact. Voice is hoarse  Language including expression, naming, repetition, comprehension was assessed and found intact. Attention span and concentration were normal. Recent and remote memory were intact. Fund of Knowledge was assessed and was intact.  Cranial Nerves II - XII - II - Visual field intact OU. III, IV, VI - Extraocular movements intact. V - left face decreased  VII - Facial movement intact bilaterally. VIII - Hearing & vestibular intact bilaterally. X - Palate elevates symmetrically. XI - Chin turning & shoulder shrug intact bilaterally. XII - Tongue protrusion intact.  Motor Strength - Right arm and right leg 5/5, left arm and left leg 4/5.  Bulk was normal and fasciculations were absent.   Motor Tone - Muscle tone was assessed at the neck and appendages and was normal.  Sensory - decreased on left arm     Coordination - The patient had normal movements in the hands and feet with no ataxia or dysmetria.  Tremor was absent.  Gait and Station - deferred.  ASSESSMENT/PLAN Karen [name] is a 57 y.o. female with history of  anxiety, depression, migraine, chronic pain, IBS, HTN metastatic lung adenocarcinoma (plan for treatment with Carboplatin, Alimta and Keytruda) who presented to the cancer center at [facility] on Friday for placement of a porta cath. When she arrived, she complained of left sided paresthesias and due to this the procedure was cancelled and she was sent to the [facility] ED for further evaluation. She stated that her symptoms had begun that morning with sensory numbness involving her face, arm and leg on the  left. The symptoms began with a feeling of heaviness and swelling (no actual swelling) to her left leg that then migrated to involve her arm and face. She also endorsed feeling weak on the left as well as lightheaded.   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  Patient presented with right hemispheric TIA Etiology:  unclear, possibly due to hypercoagulable state   from lung cancer CT head No acute abnormality.  CTA head & neck NO LVO  MRI  Small focus of diffusion restriction in the left parietal lobe  2D Echo EF 65-70%. Left ventricular diastolic parameters are  consistent with Grade I diastolic dysfunction  Will need TEE, request sent  LDL 95 HgbA1c 5.1 VTE prophylaxis - Lovenox    Diet   Diet NPO time specified Except for: Sips with Meds   No antithrombotic prior to admission, now on aspirin 325 mg daily.add plavix '75mg'$  after Port-A-Cath. Therapy recommendations:  outpatient PT/OT  Disposition:  pending  Hypertension Home meds:  none Stable Permissive hypertension (OK if < 220/120) but gradually normalize in 5-7 days Long-term BP goal normotensive  Hyperlipidemia Home meds:  none LDL 95, goal < 70 con't Crestor '10mg'$    Continue statin at discharge  Other Stroke Risk Factors Migraines  Other Active Problems Primary adenocarcinoma of lung  Depression and Anxiety  Chronic pain  Anemia-HGB 11.9  Hospital day # 4   Patient presented with right hemispheric TIA but MRI shows silent left parietal cortical infarct.  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  Recommend dual antiplatelet therapy for 3 weeks followed by aspirin alone.  TEE can be checked as an outpatient if patient prefers not to stay in the hospital.  Oncologist recommends patient getting a Port-A-Cath while she is in the hospital to facilitate her  chemotherapy to be started soon.   Greater than 50% time during this 25-minute visit was spent on counseling and coordination of care about discussion about her TIA and silent infarct and cancer related hypercoagulability discussion and answering questions Stroke team will sign off.  Kindly call for questions  Antony Contras, MD Medical Director [facility] Pager: [phone] 08/17/2022 10:48 AM    To contact Stroke Continuity provider, please refer to [email or web address] After hours, contact General Neurology

## 2022-08-17 NOTE — Progress Notes (Signed)
  Echocardiogram Echocardiogram Transesophageal has been performed.  Karen [name] 08/17/2022, 12:43 PM

## 2022-08-17 NOTE — Discharge Summary (Signed)
Name: Karen [name] MRN: [id number] DOB: 18-Mar-1966 57 y.o. PCP: Starlyn Skeans, MD  Date of Admission: 08/13/2022  1:32 PM Date of Discharge: 08/17/2022 Attending Physician: Dr. OF:[id number]. Hoffman","Mullen","Narendra","Raines","Vincent","Guilloud"}  Discharge Diagnosis: Principal Problem:   Acute ischemic stroke [facility]) Active Problems:   Anxiety   Major depression, recurrent, chronic (HCC)   Chronic pain syndrome   Migraine   Hyperlipidemia   Vocal cord paralysis   Primary adenocarcinoma of lung (HCC)   Malignant neoplasm of bone with metastases ([facility])   Depression   Anemia   Malignant pericardial effusion    Discharge Medications: Allergies as of 08/17/2022       Reactions   Lidocaine Itching   Percocet [oxycodone-acetaminophen]    unknown   Hydromorphone Hcl Itching   Oxycodone-acetaminophen Itching   Patient can tolerate acetaminophen        Medication List     STOP taking these medications    SUMAtriptan 50 MG tablet Commonly known as: Imitrex       TAKE these medications    albuterol 108 (90 Base) MCG/ACT inhaler Commonly known as: VENTOLIN HFA Inhale 2 puffs into the lungs every 6 (six) hours as needed for wheezing or shortness of breath.   aspirin EC 81 MG tablet Take 1 tablet (81 mg total) by mouth daily. Swallow whole.   clonazePAM 1 MG tablet Commonly known as: KLONOPIN Take 1 tablet (1 mg total) by mouth 2 (two) times daily. What changed:  medication strength how much to take   clopidogrel 75 MG tablet Commonly known as: PLAVIX Take 1 tablet (75 mg total) by mouth daily. Start taking on: August 18, 2022   dexamethasone 4 MG tablet Commonly known as: DECADRON Take 1 tab 2 times daily starting day before pemetrexed. Then take 2 tabs daily x 3 days starting day after carboplatin. Take with food.   diclofenac Sodium 1 % Gel Commonly known as: Voltaren Apply 4 g topically 4 (four) times daily. What changed:   when to take this reasons to take this   folic acid 1 MG tablet Commonly known as: FOLVITE Take 1 tablet (1 mg total) by mouth daily. Start 7 days before pemetrexed chemotherapy. Continue until 21 days after pemetrexed completed.   HYDROcodone-acetaminophen 5-325 MG tablet Commonly known as: NORCO/VICODIN Take 1-2 tablets by mouth every 6 (six) hours as needed for moderate pain or severe pain. Do not drive after taking   lidocaine 2 % solution Commonly known as: XYLOCAINE Use as directed 15 mLs in the mouth or throat every 4 (four) hours as needed for mouth pain (sore throat).   lidocaine-prilocaine cream Commonly known as: EMLA Apply to affected area once What changed:  how much to take how to take this when to take this reasons to take this additional instructions   ondansetron 8 MG disintegrating tablet Commonly known as: ZOFRAN-ODT Take 1 tablet (8 mg total) by mouth every 8 (eight) hours as needed for nausea or vomiting.   prochlorperazine 10 MG tablet Commonly known as: COMPAZINE Take 1 tablet (10 mg total) by mouth every 6 (six) hours as needed for refractory nausea / vomiting.   propranolol 40 MG tablet Commonly known as: INDERAL Take 1 tablet (40 mg total) by mouth 2 (two) times daily.   rosuvastatin 10 MG tablet Commonly known as: CRESTOR Take 1 tablet (10 mg total) by mouth daily.   sertraline 100 MG tablet Commonly known as: ZOLOFT Take 100 mg by mouth 2 (two) times daily.  traZODone 150 MG tablet Commonly known as: DESYREL Take 1 tablet (150 mg total) by mouth at bedtime as needed for sleep. What changed: medication strength   trimethobenzamide 300 MG capsule Commonly known as: Tigan Take 1 capsule (300 mg total) by mouth 3 (three) times daily as needed for nausea/vomiting. Tome 1 capsula (300 mg en total) por via oral 3 (tres) veces al dia segun sea necesario para las nauseas/vomitos.        Disposition and follow-up:   Karen [name] was discharged from [facility] in {DISCHARGE CONDITION:19696} condition.  At the hospital follow up visit please address:  1.  Follow-up:  a.    b.   c.   d.  2.  Labs / imaging needed at time of follow-up: ***  3.  Pending labs/ test needing follow-up: ***  Follow-up Appointments:  Follow-up Information     Mapp, Tavien, MD. Schedule an appointment as soon as possible for a visit in 2 week(s).   Why: Please call and make an appointment for 10-14 days after discharge Contact information: [place] [address] [phone]         [facility]. Schedule an appointment as soon as possible for a visit in 2 month(s).   Specialty: Neurology Why: Please call and amake an appointment for 2 months after discharge Contact information: [address] [place]. Schedule an appointment as soon as possible for a visit in 1 week(s).   Specialty: Rehabilitation Contact information: [address] [place] [id number] [place] [facility] Course by problem list: No notes on file   Subjective:   Discharge Vitals:   BP 116/75 (BP Location: Left Arm)   Pulse 88   Temp 98.5 F (36.9 C) (Oral)   Resp 20   Ht '5\' 2"'$  (1.575 m)   Wt 44.5 kg   LMP 02/08/2006   SpO2 92%   BMI 17.94 kg/m  Discharge exam: General: Pleasant, well-appearing ***  in bed. No acute distress. CV: RRR. No murmurs, rubs, or gallops. No LE edema Pulmonary: Lungs CTAB. Normal effort. No wheezing or rales. Abdominal: Soft, nontender, nondistended. Normal bowel sounds. Extremities: Radial and DP pulses 2+ and symmetric. Normal ROM. Skin: Warm and dry. No obvious rash or lesions. Neuro: A&Ox3. Moves all extremities. Normal sensation. No focal deficit. Psych: Normal mood and  affect   Pertinent Labs, Studies, and Procedures:     Latest Ref Rng & Units 08/14/2022    4:53 AM 08/13/2022   10:14 PM 08/13/2022    2:03 PM  CBC  WBC 4.0 - 10.5 K/uL 11.9  13.3  13.2   Hemoglobin 12.0 - 15.0 g/dL 11.9  12.5  13.1   Hematocrit 36.0 - 46.0 % 35.8  37.7  38.8   Platelets 150 - 400 K/uL 410  430  432        Latest Ref Rng & Units 08/14/2022    4:53 AM 08/13/2022   10:14 PM 08/13/2022    2:03 PM  CMP  Glucose 70 - 99 mg/dL 96   109   BUN 6 - 20 mg/dL 17   19   Creatinine 0.44 - 1.00 mg/dL 0.77  0.86  0.67   Sodium 135 - 145 mmol/L 135  136   Potassium 3.5 - 5.1 mmol/L 4.1   3.1   Chloride 98 - 111 mmol/L 103   102   CO2 22 - 32 mmol/L 26   24   Calcium 8.9 - 10.3 mg/dL 8.9   8.8   Total Protein 6.5 - 8.1 g/dL   6.9   Total Bilirubin 0.3 - 1.2 mg/dL   0.6   Alkaline Phos 38 - 126 U/L   115   AST 15 - 41 U/L   16   ALT 0 - 44 U/L   15     ECHOCARDIOGRAM COMPLETE  Result Date: 08/14/2022    ECHOCARDIOGRAM REPORT   Patient Name:   KAMAREA FELAND Date of Exam: 08/14/2022 Medical Rec #:  [id number]              Height:       62.0 in Accession #:    [id number]             Weight:       98.0 lb Date of Birth:  [birth year]               BSA:          1.412 m Patient Age:    62 years               BP:           134/68 mmHg Patient Gender: F                      HR:           74 bpm. Exam Location:  Inpatient Procedure: 2D Echo, Cardiac Doppler and Color Doppler Indications:    I63.9 Stroke  History:        Patient has prior history of Echocardiogram examinations, most                 recent 05/29/2021. Stroke; Risk Factors:Non-Smoker and                 Hypertension.  Sonographer:    Wilkie Aye RVT RCS Referring Phys: [id number] EMILY B MULLEN  Sonographer Comments: Some images are off axis IMPRESSIONS  1. Left ventricular ejection fraction, by estimation, is 65 to 70%. The left ventricle has normal function. The left ventricle has no regional wall motion abnormalities. Left  ventricular diastolic parameters are consistent with Grade I diastolic dysfunction (impaired relaxation).  2. Right ventricular systolic function is normal. The right ventricular size is normal.  3. Moderate pericardial effusion. The pericardial effusion is circumferential. There is no evidence of cardiac tamponade.  4. The mitral valve is normal in structure. Trivial mitral valve regurgitation.  5. The aortic valve is tricuspid. Aortic valve regurgitation is not visualized. No aortic stenosis is present.  6. The inferior vena cava is normal in size with greater than 50% respiratory variability, suggesting right atrial pressure of 3 mmHg. Comparison(s): Compared to prior TTE on 05/2021, a moderate sized pericardial effusion is now present. Conclusion(s)/Recommendation(s): No intracardiac source of embolism detected on this transthoracic study. Consider a transesophageal echocardiogram to exclude cardiac source of embolism if clinically indicated. FINDINGS  Left Ventricle: Left ventricular ejection fraction, by estimation, is 65 to 70%. The left ventricle has normal function. The left ventricle has no regional wall motion abnormalities. The left ventricular internal cavity size was normal in size. There is  no left ventricular hypertrophy. Left ventricular diastolic parameters are consistent with Grade I  diastolic dysfunction (impaired relaxation). Right Ventricle: The right ventricular size is normal. No increase in right ventricular wall thickness. Right ventricular systolic function is normal. Left Atrium: Left atrial size was normal in size. Right Atrium: Right atrial size was normal in size. Pericardium: A moderately sized pericardial effusion is present. The pericardial effusion is circumferential. There is no evidence of cardiac tamponade. Mitral Valve: The mitral valve is normal in structure. Trivial mitral valve regurgitation. Tricuspid Valve: The tricuspid valve is normal in structure. Tricuspid valve  regurgitation is mild. Aortic Valve: The aortic valve is tricuspid. Aortic valve regurgitation is not visualized. No aortic stenosis is present. Aortic valve mean gradient measures 3.0 mmHg. Aortic valve peak gradient measures 6.2 mmHg. Aortic valve area, by VTI measures 2.21 cm. Pulmonic Valve: The pulmonic valve was normal in structure. Pulmonic valve regurgitation is trivial. Aorta: The aortic root is normal in size and structure. Venous: The inferior vena cava is normal in size with greater than 50% respiratory variability, suggesting right atrial pressure of 3 mmHg. IAS/Shunts: The atrial septum is grossly normal.  LEFT VENTRICLE PLAX 2D LVIDd:         3.90 cm   Diastology LVIDs:         2.40 cm   LV e' medial:    5.33 cm/s LV PW:         1.00 cm   LV E/e' medial:  11.0 LV IVS:        0.90 cm   LV e' lateral:   9.46 cm/s LVOT diam:     1.80 cm   LV E/e' lateral: 6.2 LV SV:         53 LV SV Index:   38 LVOT Area:     2.54 cm  RIGHT VENTRICLE             IVC RV S prime:     15.30 cm/s  IVC diam: 1.10 cm TAPSE (M-mode): 2.1 cm LEFT ATRIUM             Index        RIGHT ATRIUM          Index LA diam:        2.40 cm 1.70 cm/m   RA Area:     9.25 cm LA Vol (A2C):   21.7 ml 15.37 ml/m  RA Volume:   18.95 ml 13.42 ml/m LA Vol (A4C):   31.5 ml 22.31 ml/m LA Biplane Vol: 28.3 ml 20.05 ml/m  AORTIC VALVE                    PULMONIC VALVE AV Area (Vmax):    2.09 cm     PV Vmax:          0.77 m/s AV Area (Vmean):   2.02 cm     PV Peak grad:     2.4 mmHg AV Area (VTI):     2.21 cm     PR End Diast Vel: 6.86 msec AV Vmax:           124.00 cm/s AV Vmean:          83.300 cm/s AV VTI:            0.241 m AV Peak Grad:      6.2 mmHg AV Mean Grad:      3.0 mmHg LVOT Vmax:         102.00 cm/s LVOT Vmean:        66.000 cm/s  LVOT VTI:          0.209 m LVOT/AV VTI ratio: 0.87  AORTA Ao Root diam: 2.60 cm Ao Asc diam:  2.60 cm MITRAL VALVE               TRICUSPID VALVE MV Area (PHT): 2.99 cm    TR Peak grad:   17.5 mmHg MV  Decel Time: 254 msec    TR Vmax:        209.00 cm/s MV E velocity: 58.60 cm/s MV A velocity: 89.70 cm/s  SHUNTS MV E/A ratio:  0.65        Systemic VTI:  0.21 m                            Systemic Diam: 1.80 cm Gwyndolyn Kaufman MD Electronically signed by Gwyndolyn Kaufman MD Signature Date/Time: 08/14/2022/2:29:20 PM    Final    CT ANGIO HEAD NECK W WO CM  Result Date: 08/13/2022 CLINICAL DATA:  Stroke follow-up EXAM: CT ANGIOGRAPHY HEAD AND NECK TECHNIQUE: Multidetector CT imaging of the head and neck was performed using the standard protocol during bolus administration of intravenous contrast. Multiplanar CT image reconstructions and MIPs were obtained to evaluate the vascular anatomy. Carotid stenosis measurements (when applicable) are obtained utilizing NASCET criteria, using the distal internal carotid diameter as the denominator. RADIATION DOSE REDUCTION: This exam was performed according to the departmental dose-optimization program which includes automated exposure control, adjustment of the mA and/or kV according to patient size and/or use of iterative reconstruction technique. CONTRAST:  28m OMNIPAQUE IOHEXOL 350 MG/ML SOLN COMPARISON:  07/01/2022 FINDINGS: CT HEAD FINDINGS Brain: There is no mass, hemorrhage or extra-axial collection. The size and configuration of the ventricles and extra-axial CSF spaces are normal. There is no acute or chronic infarction. The brain parenchyma is normal. Skull: The visualized skull base, calvarium and extracranial soft tissues are normal. Sinuses/Orbits: No fluid levels or advanced mucosal thickening of the visualized paranasal sinuses. No mastoid or middle ear effusion. The orbits are normal. CTA NECK FINDINGS SKELETON: There is no bony spinal canal stenosis. No lytic or blastic lesion. OTHER NECK: Normal pharynx, larynx and major salivary glands. No cervical lymphadenopathy. Unremarkable thyroid gland. UPPER CHEST: Masslike opacity at the right hilum measuring  3.4 x 2.1 cm. There is peribronchial thickening in the right lung apex. Small right pleural effusion. AORTIC ARCH: There is no calcific atherosclerosis of the aortic arch. There is no aneurysm, dissection or hemodynamically significant stenosis of the visualized portion of the aorta. Conventional 3 vessel aortic branching pattern. The visualized proximal subclavian arteries are widely patent. RIGHT CAROTID SYSTEM: Normal without aneurysm, dissection or stenosis. LEFT CAROTID SYSTEM: Normal without aneurysm, dissection or stenosis. VERTEBRAL ARTERIES: Left dominant configuration. Both origins are clearly patent. There is no dissection, occlusion or flow-limiting stenosis to the skull base (V1-V3 segments). CTA HEAD FINDINGS POSTERIOR CIRCULATION: --Vertebral arteries: Normal V4 segments. --Inferior cerebellar arteries: Normal. --Basilar artery: Normal. --Superior cerebellar arteries: Normal. --Posterior cerebral arteries (PCA): Normal. ANTERIOR CIRCULATION: --Intracranial internal carotid arteries: Normal. --Anterior cerebral arteries (ACA): Normal. Both A1 segments are present. Patent anterior communicating artery (a-comm). --Middle cerebral arteries (MCA): Normal. VENOUS SINUSES: As permitted by contrast timing, patent. ANATOMIC VARIANTS: None Review of the MIP images confirms the above findings. IMPRESSION: 1. No emergent large vessel occlusion or high-grade stenosis of the intracranial or cervical arteries. 2. Masslike opacity at the right hilum measuring 3.4 x 2.1 cm, likely confluent  hilar malignant adenopathy. 3. Small right pleural effusion. Electronically Signed   By: Ulyses Jarred M.D.   On: 08/13/2022 23:51   MR Brain Wo Contrast (neuro protocol)  Result Date: 08/13/2022 CLINICAL DATA:  TIA EXAM: MRI HEAD WITHOUT CONTRAST TECHNIQUE: Multiplanar, multiecho pulse sequences of the brain and surrounding structures were obtained without intravenous contrast. COMPARISON:  MRI Brain 07/31/22 FINDINGS: Brain:  Small punctate focus of acute infarct in the left parietal lobe (series 5, image 33). No hemorrhage. No hydrocephalus. No extra-axial fluid collection. There are multiple small scattered subcortical T2/FLAIR hyperintense lesions, which are favored to represent sequela of chronic microvascular ischemic change in appear unchanged compared to recent prior brain MRI. Vascular: Normal flow voids. Skull and upper cervical spine: Normal marrow signal. Sinuses/Orbits: Negative. Other: None. IMPRESSION: Small focus of diffusion restriction in the left parietal lobe is favored to represent a punctate acute infarct, new compared to 07/31/22. Given patient's history of malignancy a follow up MRI in 2-3 months to ensure resolution. Electronically Signed   By: Marin Roberts M.D.   On: 08/13/2022 15:59     Discharge Instructions: Discharge Instructions     Ambulatory referral to Neurology   Complete by: As directed    An appointment is requested in approximately: 8 weeks   Ambulatory referral to Occupational Therapy   Complete by: As directed    Ambulatory referral to Physical Therapy   Complete by: As directed    Ambulatory referral to Speech Therapy   Complete by: As directed    Diet - low sodium heart healthy   Complete by: As directed    Discharge instructions   Complete by: As directed    1. Please continue to take aspirin '81mg'$  daily and plavix '75mg'$  daily for 18 more days and then continue to take aspirin 81 mg daily. Please start taking crestor '10mg'$  daily. 2. Please follow up with the neurologists and outpatient physical therapy. The internal medicine clinic will call you to make an appointment 3. Please go to your chemotherapy session tomorrow as planned. 4. We increased your doses of klonopin to 1 mg twice daily, trazodone to '150mg'$  at bedtime, and continue taking zoloft '100mg'$  twice daily.   Increase activity slowly   Complete by: As directed        Discharge Instructions   None      Signed: Linus Galas, MD 08/17/2022, 7:34 PM   Pager: [phone]

## 2022-08-17 NOTE — CV Procedure (Signed)
     TRANSESOPHAGEAL ECHOCARDIOGRAM   NAME:  Karen [name]   MRN: [id number] DOB:  1965-09-23   ADMIT DATE: 08/13/2022  INDICATIONS: CVA  PROCEDURE:   Informed consent was obtained prior to the procedure. The risks, benefits and alternatives for the procedure were discussed and the patient comprehended these risks.  Risks include, but are not limited to, cough, sore throat, vomiting, nausea, somnolence, esophageal and stomach trauma or perforation, bleeding, low blood pressure, aspiration, pneumonia, infection, trauma to the teeth and death.    After a procedural time-out, the oropharynx was anesthetized and the patient was sedated by the anesthesia service. The transesophageal probe was inserted in the esophagus and stomach without difficulty and multiple views were obtained. Anesthesia was monitored by [place], CRNA.    COMPLICATIONS:    There were no immediate complications.  FINDINGS:  No cardiac source of embolism seen.  Late shunting observed on bubble study, suggestive of intrapulmonary shunting  Oswaldo Milian MD [facility]  [address] [place], [place] [address] [phone]   2:00 PM

## 2022-08-17 NOTE — Progress Notes (Signed)
Bilateral lower extremity venous study completed.   Preliminary results relayed to RN.  Please see CV Procedures for preliminary results.  Emelynn Rance, RVT  4:40 PM 08/17/22

## 2022-08-17 NOTE — Progress Notes (Signed)
Patient received from TEE. Vital signs stable

## 2022-08-17 NOTE — Progress Notes (Signed)
Patient discharged after giving discharge instructions. Taken t her husband car by wheelchair

## 2022-08-17 NOTE — Transfer of Care (Signed)
Immediate Anesthesia Transfer of Care Note  Patient: Karen [name]  Procedure(s) Performed: TRANSESOPHAGEAL ECHOCARDIOGRAM (TEE) BUBBLE STUDY  Patient Location: Endoscopy Unit  Anesthesia Type:MAC  Level of Consciousness: drowsy and patient cooperative  Airway & Oxygen Therapy: Patient Spontanous Breathing and Patient connected to nasal cannula oxygen  Post-op Assessment: Report given to RN and Post -op Vital signs reviewed and stable  Post vital signs: Reviewed and stable  Last Vitals:  Vitals Value Taken Time  BP    Temp    Pulse    Resp    SpO2      Last Pain:  Vitals:   08/17/22 1000  TempSrc: Temporal  PainSc: 3       Patients Stated Pain Goal: 4 ([id number])  Complications: No notable events documented.

## 2022-08-17 NOTE — Progress Notes (Signed)
PT Cancellation Note  Patient Details Name: Karen [name] MRN: [id number] DOB: 29-Mar-1966   Cancelled Treatment:    Reason Eval/Treat Not Completed: Patient at procedure or test/unavailable  Wyona Almas, PT, DPT Acute Rehabilitation Services Office [place] 08/17/2022, 11:33 AM

## 2022-08-17 NOTE — Anesthesia Preprocedure Evaluation (Signed)
Anesthesia Evaluation  Patient identified by MRN, date of birth, ID band Patient awake    Reviewed: Allergy & Precautions, NPO status , Patient's Chart, lab work & pertinent test results  Airway Mallampati: II       Dental  (+) Dental Advisory Given, Poor Dentition, Missing, Chipped   Pulmonary  Latent TB Mediastinal adenopathy Vocal cord paralysis   Pulmonary exam normal breath sounds clear to auscultation       Cardiovascular hypertension, Pt. on medications Normal cardiovascular exam Rhythm:Regular Rate:Normal     Neuro/Psych  Headaches PSYCHIATRIC DISORDERS Anxiety Depression    Chronic pain syndrome  Neuromuscular disease CVA, Residual Symptoms    GI/Hepatic Neg liver ROS, PUD,,,  Endo/Other  negative endocrine ROS    Renal/GU negative Renal ROS  negative genitourinary   Musculoskeletal negative musculoskeletal ROS (+)  Chronic back pain   Abdominal Normal abdominal exam  (+)   Peds  Hematology  (+) Blood dyscrasia, anemia   Anesthesia Other Findings   Reproductive/Obstetrics                             Anesthesia Physical Anesthesia Plan  ASA: 3  Anesthesia Plan: MAC   Post-op Pain Management: Minimal or no pain anticipated   Induction: Intravenous  PONV Risk Score and Plan: 2  Airway Management Planned: Natural Airway and Mask  Additional Equipment: None and TEE  Intra-op Plan:   Post-operative Plan:   Informed Consent: I have reviewed the patients History and Physical, chart, labs and discussed the procedure including the risks, benefits and alternatives for the proposed anesthesia with the patient or authorized representative who has indicated his/her understanding and acceptance.     Dental advisory given and Interpreter used for interveiw  Plan Discussed with: CRNA  Anesthesia Plan Comments:         Anesthesia Quick Evaluation

## 2022-08-17 NOTE — Progress Notes (Signed)
Subjective:   Summary: Karen [name] is a 57 y.o. year old female currently admitted on the IMTS HD#4 for stroke.  Overnight Events: NAEON.  N.p.o. for TEE today. Additionally had Port placed today and DVT study after TEE found L atrial shunt. Attempted to see her in her room x3 but unable to assess because she was off the floor for procedures  Objective:  Vital signs in last 24 hours: Vitals:   08/16/22 1652 08/16/22 1949 08/17/22 0014 08/17/22 0419  BP: 136/71 105/71 (!) 106/54 (!) 138/110  Pulse: 92 87 83 91  Resp: '18 17 17 19  '$ Temp: 98.2 F (36.8 C) 98.1 F (36.7 C) 98 F (36.7 C) 98.1 F (36.7 C)  TempSrc: Oral Oral Oral Oral  SpO2: 95% (!) 88% 92% 92%  Weight:      Height:       Supplemental O2: Room Air SpO2: 92 %   Physical Exam:  Deferred, OTF for procedures.   Filed Weights   08/13/22 1349  Weight: 44.5 kg     Intake/Output Summary (Last 24 hours) at 08/17/2022 0715 Last data filed at 08/17/2022 0500 Gross per 24 hour  Intake 835.62 ml  Output --  Net 835.62 ml   Net IO Since Admission: 1,877.7 mL [08/17/22 0715]  Pertinent Labs:    Latest Ref Rng & Units 08/14/2022    4:53 AM 08/13/2022   10:14 PM 08/13/2022    2:03 PM  CBC  WBC 4.0 - 10.5 K/uL 11.9  13.3  13.2   Hemoglobin 12.0 - 15.0 g/dL 11.9  12.5  13.1   Hematocrit 36.0 - 46.0 % 35.8  37.7  38.8   Platelets 150 - 400 K/uL 410  430  432        Latest Ref Rng & Units 08/14/2022    4:53 AM 08/13/2022   10:14 PM 08/13/2022    2:03 PM  CMP  Glucose 70 - 99 mg/dL 96   109   BUN 6 - 20 mg/dL 17   19   Creatinine 0.44 - 1.00 mg/dL 0.77  0.86  0.67   Sodium 135 - 145 mmol/L 135   136   Potassium 3.5 - 5.1 mmol/L 4.1   3.1   Chloride 98 - 111 mmol/L 103   102   CO2 22 - 32 mmol/L 26   24   Calcium 8.9 - 10.3 mg/dL 8.9   8.8   Total Protein 6.5 - 8.1 g/dL   6.9   Total Bilirubin 0.3 - 1.2 mg/dL   0.6   Alkaline Phos 38 - 126 U/L   115   AST 15 - 41 U/L   16   ALT  0 - 44 U/L   15      Imaging:   EKG:   Assessment/Plan:   Principal Problem:   Acute ischemic stroke (HCC) Active Problems:   Anxiety   Major depression, recurrent, chronic (HCC)   Chronic pain syndrome   Migraine   Hyperlipidemia   Vocal cord paralysis   Primary adenocarcinoma of lung (HCC)   Malignant neoplasm of bone with metastases (HCC)   Depression   Anemia   Malignant pericardial effusion   Patient Summary: Karen [name] is a 57 y.o. female with PMH of recently diagnosed metastatic lung adenocarcinoma (07/2022), HTN, migraines, IBS, HTN, PUD, and anxiety/depression who initially presented to the [facility]  on 2/23 for Port-A-Cath placement and found to have acute ischemic stroke, transferred to [facility] with plan for TEE.    #Small acute left parietal ischemic infarct Etiology unclear as patient's infarct does not account for her presenting symptoms. Neurology questions if this is possible tiny brain metastasis versus complicated migraine. OTF when we went to assess x3 today, will see patient tomorrow. TEE showed atrial shunt and she went for DVT study to evaluate if [facility] necessary.  -Neurology following, appreciate recs -Cardiology consulted, TEE showing atrial shunt -Continue ASA 81 mg and Plavix 75 mg daily unless DVT study positive, then start eliquis -Continue rosuvastatin 10 mg daily -Outpatient PT/OT   #Primary adenocarcinoma of the lung PET scan on 2/08 showed a hypermetabolic thoracic/lower cervical adenopathy and pulmonary nodularity favoring small cell lung cancer versus lymphoma as well as hypermetabolic osseous lesions consistent with metastatic disease. Biopsy confirmed adenocarcinoma of the lungs and patient was referred to oncology. Eval by oncology on 2/20 with plan to place a Port-A-Cath on 2/23 to initiate treatment with Carboplatin, Alimta and Keytruda. Patient is being followed by palliative care in the outpatient to help manage  her cancer related symptoms. She reports ongoing fatigue, loss of appetite, nausea and occasional headache and vomiting. -Oncology team following peripherally, pending first chemo on 2/28 -IR consulted, port placed -Norco 5-325 mg 1-2 tabs Q4h prn for pain -Toradol 15 mg q6hr prn for pain for 5 days (day 4/5) -PRN Zofran and Compazine for nausea/vomiting -Chaplain following for emotional and spiritual support, appreciate assistance   #Moderate pericardial effusion TTE on 2/24 showed EF 65-70%, G1DD, moderate pericardial effusion but no evidence of cardiac tamponade. Pericardial effusion likely secondary to recently diagnosed lung malignancy. Unable to assess today as above. TEE confirms stable moderate pericardial effusion and moderate pleural effusion -Cardiology following   #Vocal cord paralysis Evaluated by ENT in December 2023 due to month-long history of hoarseness and found to have left true vocal cord paralysis. She was referred to SLP for evaluation and barium swallow study 1 month ago showed moderate dysphagia with low aspiration risk. Patient eval by SLP during this hospitalization and deemed mild aspiration risk. -Continue regular diet; nectar-thick liquids -Continue feeding supplements -Outpatient SLP follow-up   #Anxiety/depression Followed by Beverly Sessions in the outpatient. Symptoms exacerbated by recent diagnosis of metastatic lung adenocarcinoma. If patient's appetite continues to be poor, will consider initiating mirtazapine in the outpatient setting but will hold off for now as she will need close monitoring for possible serotonin syndrome. -Continue Klonopin 1 mg twice daily -Continue sertraline 100 mg twice daily -Continue trazodone 150 mg as needed at bedtime   #Migraine Patient continues to endorse occasional headaches during her hospitalization. -Consider headache cocktail as needed  Diet:  Regular diet, nectar thick fluids after procedures completed IVF:  None,None VTE: Enoxaparin Code: Full PT/OT recs:  Outpatient TOC recs:  Family Update:   Dispo: Anticipated discharge to Home in 1 days pending diagnostic workup.   Linus Galas, MD PGY-1 Internal Medicine Resident Please contact the on call pager after 5 pm and on weekends at [phone].

## 2022-08-17 NOTE — Consult Note (Signed)
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 was counseling/coordinating care for [facility] placement

## 2022-08-18 ENCOUNTER — Telehealth: Payer: Self-pay

## 2022-08-18 ENCOUNTER — Encounter (HOSPITAL_COMMUNITY): Payer: Self-pay | Admitting: Cardiology

## 2022-08-18 ENCOUNTER — Inpatient Hospital Stay: Payer: Commercial Managed Care - HMO

## 2022-08-18 ENCOUNTER — Inpatient Hospital Stay (HOSPITAL_BASED_OUTPATIENT_CLINIC_OR_DEPARTMENT_OTHER): Payer: Commercial Managed Care - HMO | Admitting: Hematology

## 2022-08-18 ENCOUNTER — Inpatient Hospital Stay (HOSPITAL_BASED_OUTPATIENT_CLINIC_OR_DEPARTMENT_OTHER): Payer: Commercial Managed Care - HMO | Admitting: Nurse Practitioner

## 2022-08-18 ENCOUNTER — Other Ambulatory Visit: Payer: Self-pay

## 2022-08-18 VITALS — BP 118/72 | HR 89 | Temp 98.2°F | Resp 18

## 2022-08-18 DIAGNOSIS — C419 Malignant neoplasm of bone and articular cartilage, unspecified: Secondary | ICD-10-CM

## 2022-08-18 DIAGNOSIS — G893 Neoplasm related pain (acute) (chronic): Secondary | ICD-10-CM | POA: Diagnosis not present

## 2022-08-18 DIAGNOSIS — J90 Pleural effusion, not elsewhere classified: Secondary | ICD-10-CM | POA: Diagnosis not present

## 2022-08-18 DIAGNOSIS — R630 Anorexia: Secondary | ICD-10-CM | POA: Diagnosis not present

## 2022-08-18 DIAGNOSIS — Z8611 Personal history of tuberculosis: Secondary | ICD-10-CM | POA: Diagnosis not present

## 2022-08-18 DIAGNOSIS — C3490 Malignant neoplasm of unspecified part of unspecified bronchus or lung: Secondary | ICD-10-CM

## 2022-08-18 DIAGNOSIS — Z681 Body mass index (BMI) 19 or less, adult: Secondary | ICD-10-CM | POA: Diagnosis not present

## 2022-08-18 DIAGNOSIS — Z7189 Other specified counseling: Secondary | ICD-10-CM

## 2022-08-18 DIAGNOSIS — E43 Unspecified severe protein-calorie malnutrition: Secondary | ICD-10-CM | POA: Diagnosis not present

## 2022-08-18 DIAGNOSIS — Z79899 Other long term (current) drug therapy: Secondary | ICD-10-CM | POA: Diagnosis not present

## 2022-08-18 DIAGNOSIS — Z515 Encounter for palliative care: Secondary | ICD-10-CM

## 2022-08-18 DIAGNOSIS — F32A Depression, unspecified: Secondary | ICD-10-CM | POA: Diagnosis not present

## 2022-08-18 DIAGNOSIS — R634 Abnormal weight loss: Secondary | ICD-10-CM

## 2022-08-18 DIAGNOSIS — Z8615 Personal history of latent tuberculosis infection: Secondary | ICD-10-CM | POA: Diagnosis not present

## 2022-08-18 DIAGNOSIS — F419 Anxiety disorder, unspecified: Secondary | ICD-10-CM | POA: Diagnosis not present

## 2022-08-18 DIAGNOSIS — C7951 Secondary malignant neoplasm of bone: Secondary | ICD-10-CM | POA: Diagnosis not present

## 2022-08-18 DIAGNOSIS — R590 Localized enlarged lymph nodes: Secondary | ICD-10-CM | POA: Diagnosis not present

## 2022-08-18 DIAGNOSIS — R530 Neoplastic (malignant) related fatigue: Secondary | ICD-10-CM

## 2022-08-18 LAB — CBC WITH DIFFERENTIAL (CANCER CENTER ONLY)
Abs Immature Granulocytes: 0.06 K/uL (ref 0.00–0.07)
Basophils Absolute: 0.1 K/uL (ref 0.0–0.1)
Basophils Relative: 0 %
Eosinophils Absolute: 0.8 K/uL — ABNORMAL HIGH (ref 0.0–0.5)
Eosinophils Relative: 6 %
HCT: 36.6 % (ref 36.0–46.0)
Hemoglobin: 12.7 g/dL (ref 12.0–15.0)
Immature Granulocytes: 1 %
Lymphocytes Relative: 14 %
Lymphs Abs: 1.8 K/uL (ref 0.7–4.0)
MCH: 31.4 pg (ref 26.0–34.0)
MCHC: 34.7 g/dL (ref 30.0–36.0)
MCV: 90.4 fL (ref 80.0–100.0)
Monocytes Absolute: 0.9 K/uL (ref 0.1–1.0)
Monocytes Relative: 7 %
Neutro Abs: 9.1 K/uL — ABNORMAL HIGH (ref 1.7–7.7)
Neutrophils Relative %: 72 %
Platelet Count: 455 K/uL — ABNORMAL HIGH (ref 150–400)
RBC: 4.05 MIL/uL (ref 3.87–5.11)
RDW: 12.3 % (ref 11.5–15.5)
WBC Count: 12.6 K/uL — ABNORMAL HIGH (ref 4.0–10.5)
nRBC: 0 % (ref 0.0–0.2)

## 2022-08-18 LAB — CMP (CANCER CENTER ONLY)
ALT: 12 U/L (ref 0–44)
AST: 16 U/L (ref 15–41)
Albumin: 3.8 g/dL (ref 3.5–5.0)
Alkaline Phosphatase: 109 U/L (ref 38–126)
Anion gap: 7 (ref 5–15)
BUN: 15 mg/dL (ref 6–20)
CO2: 26 mmol/L (ref 22–32)
Calcium: 8.5 mg/dL — ABNORMAL LOW (ref 8.9–10.3)
Chloride: 104 mmol/L (ref 98–111)
Creatinine: 0.54 mg/dL (ref 0.44–1.00)
GFR, Estimated: >60 mL/min (ref >60)
Glucose, Bld: 122 mg/dL — ABNORMAL HIGH (ref 70–99)
Potassium: 3.5 mmol/L (ref 3.5–5.1)
Sodium: 137 mmol/L (ref 135–145)
Total Bilirubin: 0.3 mg/dL (ref 0.3–1.2)
Total Protein: 6.2 g/dL — ABNORMAL LOW (ref 6.5–8.1)

## 2022-08-18 LAB — TSH: TSH: 2.076 u[IU]/mL (ref 0.350–4.500)

## 2022-08-18 MED ORDER — SODIUM CHLORIDE 0.9 % IV SOLN
10.0000 mg | Freq: Once | INTRAVENOUS | Status: AC
  Administered 2022-08-18: 10 mg via INTRAVENOUS
  Filled 2022-08-18: qty 10

## 2022-08-18 MED ORDER — ERGOCALCIFEROL 1.25 MG (50000 UT) PO CAPS: 50000.0000 [IU] | ORAL_CAPSULE | ORAL | 2 refills | Status: DC

## 2022-08-18 MED ORDER — HEPARIN SOD (PORK) LOCK FLUSH 100 UNIT/ML IV SOLN: 500.0000 [IU] | Freq: Once | INTRAVENOUS | Status: DC | PRN

## 2022-08-18 MED ORDER — SODIUM CHLORIDE 0.9% FLUSH
10.0000 mL | Freq: Once | INTRAVENOUS | Status: AC | PRN
  Administered 2022-08-18: 10 mL

## 2022-08-18 MED ORDER — SODIUM CHLORIDE 0.9% FLUSH: 10.0000 mL | INTRAVENOUS | Status: DC | PRN

## 2022-08-18 MED ORDER — SODIUM CHLORIDE 0.9 % IV SOLN
430.0000 mg | Freq: Once | INTRAVENOUS | Status: AC
  Administered 2022-08-18: 450 mg via INTRAVENOUS
  Filled 2022-08-18: qty 45

## 2022-08-18 MED ORDER — SODIUM CHLORIDE 0.9 % IV SOLN
150.0000 mg | Freq: Once | INTRAVENOUS | Status: AC
  Administered 2022-08-18: 150 mg via INTRAVENOUS
  Filled 2022-08-18: qty 150

## 2022-08-18 MED ORDER — SODIUM CHLORIDE 0.9 % IV SOLN
500.0000 mg/m2 | Freq: Once | INTRAVENOUS | Status: AC
  Administered 2022-08-18: 700 mg via INTRAVENOUS
  Filled 2022-08-18: qty 20

## 2022-08-18 MED ORDER — ZOLEDRONIC ACID 4 MG/100ML IV SOLN
4.0000 mg | Freq: Once | INTRAVENOUS | Status: AC
  Administered 2022-08-18: 4 mg via INTRAVENOUS
  Filled 2022-08-18: qty 100

## 2022-08-18 MED ORDER — PALONOSETRON HCL INJECTION 0.25 MG/5ML
0.2500 mg | Freq: Once | INTRAVENOUS | Status: AC
  Administered 2022-08-18: 0.25 mg via INTRAVENOUS
  Filled 2022-08-18: qty 5

## 2022-08-18 MED ORDER — SODIUM CHLORIDE 0.9 % IV SOLN: Freq: Once | INTRAVENOUS | Status: AC
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
## 2022-08-18 NOTE — Anesthesia Postprocedure Evaluation (Signed)
Anesthesia Post Note  Patient: Quadasha Ebbers  Procedure(s) Performed: TRANSESOPHAGEAL ECHOCARDIOGRAM (TEE) BUBBLE STUDY     Patient location during evaluation: PACU Anesthesia Type: MAC Level of consciousness: awake Pain management: pain level controlled Vital Signs Assessment: post-procedure vital signs reviewed and stable Respiratory status: spontaneous breathing Cardiovascular status: stable Postop Assessment: no apparent nausea or vomiting Anesthetic complications: no  No notable events documented.  Last Vitals:  Vitals:   08/17/22 1615 08/17/22 1715  BP: 118/70 116/75  Pulse: 90 88  Resp: 20 20  Temp:  36.9 C  SpO2: 92% 92%    Last Pain:  Vitals:   08/17/22 1814  TempSrc:   PainSc: 2                  Huston Foley

## 2022-08-18 NOTE — Transitions of Care (Post Inpatient/ED Visit) (Unsigned)
   08/18/2022  Name: Karen [name] MRN: [id number] DOB: 1966/04/24  Today's TOC FU Call Status: Today's TOC FU Call Status:: Unsuccessul Call (1st Attempt) Unsuccessful Call (1st Attempt) Date: 08/18/22  Attempted to reach the patient regarding the most recent Inpatient/ED visit.  Follow Up Plan: Additional outreach attempts will be made to reach the patient to complete the Transitions of Care (Post Inpatient/ED visit) call.   Signature Juanda Crumble, [place] Direct Dial [phone]

## 2022-08-18 NOTE — Patient Instructions (Signed)
Instrucciones al darle de alta: Discharge Instructions Gracias por elegir al [facility] de Cncer de [place] para brindarle atencin mdica de oncologa y Music therapist.   Si usted tiene una cita de laboratorio con [place], por favor vaya directamente [place] y regstrese en el rea de Control and instrumentation engineer.   Use ropa cmoda y [place] para tener fcil acceso a las vas del Portacath (acceso venoso de Engineer, site duracin) o la lnea PICC (catter central colocado por va perifrica).   Nos esforzamos por ofrecerle tiempo de calidad con su proveedor. Es posible que tenga que volver a programar su cita si llega tarde (15 minutos o ms).  El llegar tarde le afecta a usted y a otros pacientes cuyas citas son posteriores a Merchandiser, retail.  Adems, si usted falta a tres o ms citas sin avisar a la oficina, puede ser retirado(a) de la clnica a discrecin del proveedor.      Para las solicitudes de renovacin de recetas, pida a su farmacia que se ponga en contacto con nuestra oficina y deje que transcurran 21 horas para que se complete el proceso de las renovaciones.    Hoy usted recibi los siguientes agentes de quimioterapia e/o inmunoterapia : Alimta, Carbo   Para ayudar a prevenir las nuseas y los vmitos despus de su tratamiento, le recomendamos que tome su medicamento para las nuseas segn las indicaciones.  LOS SNTOMAS QUE DEBEN COMUNICARSE INMEDIATAMENTE SE INDICAN A CONTINUACIN: *FIEBRE SUPERIOR A 100.4 F (38 C) O MS *ESCALOFROS O SUDORACIN *NUSEAS Y VMITOS QUE NO SE CONTROLAN CON EL MEDICAMENTO PARA LAS NUSEAS *DIFICULTAD INUSUAL PARA RESPIRAR  *MORETONES O HEMORRAGIAS NO HABITUALES *PROBLEMAS URINARIOS (dolor o ardor al Garment/textile technologist o frecuencia para Garment/textile technologist) *PROBLEMAS INTESTINALES (diarrea inusual, estreimiento, dolor cerca del ano) SENSIBILIDAD EN LA BOCA Y EN LA GARGANTA CON O SIN LA PRESENCIA DE LCERAS (dolor de garganta, llagas en la boca o dolor de muelas/dientes) ERUPCIN,  HINCHAZN O DOLORES INUSUALES FLUJO VAGINAL INUSUAL O PICAZN/RASQUIA    Los puntos marcados con un asterisco ( *) indican una posible emergencia y debe hacer un seguimiento tan pronto como le sea posible o vaya al Departamento de Emergencias si se le presenta algn problema.  Por favor, muestre la [place] DE ADVERTENCIA DE Windy Canny DE ADVERTENCIA DE Benay Spice al registrarse en [address] de Emergencias y a la enfermera de triaje.  Si tiene preguntas despus de su visita o necesita cancelar o volver a programar su cita, por favor pngase en contacto con [place]  Dept: [phone] y [place] instrucciones. Las horas de oficina son de 8:00 a.m. a 4:30 p.m. de lunes a viernes. Por favor, tenga en cuenta que los mensajes de voz que se dejan despus de las 4:00 p.m. posiblemente no se devolvern hasta el siguiente da de [place].  Cerramos los fines de semana y [employer]. En todo momento tiene acceso a una enfermera para preguntas urgentes. Por favor, llame al nmero principal de la clnica Dept: [phone] y [place] instrucciones.   Para cualquier pregunta que no sea de carcter urgente, tambin puede ponerse en contacto con su proveedor [employer]. Ahora ofrecemos visitas electrnicas para cualquier persona mayor de 18 aos que solicite atencin mdica en lnea para los sntomas que no sean urgentes. Para ms detalles vaya a [email or web address].   Tambin puede bajar la aplicacin de MyChart! Vaya a la tienda de aplicaciones, busque "MyChart", abra la aplicacin,  seleccione [place], e ingrese con su nombre de usuario y la contrasea de Pharmacist, community.  Pemetrexed Injection Qu es este medicamento? El PEMETREXED trata algunos tipos de cncer. Acta desacelerando el crecimiento de clulas cancerosas. Este medicamento puede ser utilizado para otros usos; si tiene alguna pregunta consulte con su  proveedor de atencin mdica o con su farmacutico. MARCAS COMUNES: Alimta, PEMFEXY Qu le debo informar a mi profesional de la salud antes de tomar este medicamento? Necesitan saber si usted presenta alguno de los siguientes problemas o situaciones: Infeccin, tales como varicela, fuegos labial o herpes Enfermedad renal Niveles bajos de clulas sanguneas (glbulos blancos, glbulos rojos y plaquetas) Enfermedad pulmonar o respiratoria, como asma Terapia de radiacin [place] reaccin alrgica o inusual al pemetrexed, a otros medicamentos, alimentos, colorantes o conservantes Si usted o su pareja est embarazada o intentando quedar embarazada Si est amamantando a un beb Cmo debo Insurance account manager medicamento? Este medicamento se inyecta en una vena. Su equipo de atencin lo [employer] en un hospital o en un entorno clnico. Hable con su equipo de atencin sobre el uso de este medicamento en nios. Puede requerir atencin especial. Sobredosis: Pngase en contacto inmediatamente con un centro toxicolgico o una sala de urgencia si usted cree que haya tomado demasiado medicamento. ATENCIN: [employer] es solo para usted. No comparta este medicamento con nadie. Qu sucede si me olvido de una dosis? Cumpla con las citas para dosis de seguimiento. Es importante no olvidar ninguna dosis. Llame a su equipo de atencin si no puede asistir a una cita. Qu puede interactuar con este medicamento? No use este medicamento con ninguno de los siguientes productos: Clinical biochemist de virus vivos Este medicamento tambin podra Counselling psychologist con los siguientes productos: Ibuprofeno Puede ser que esta lista no menciona todas las posibles interacciones. Informe a su profesional de KB Home	[place] de [employer] Corporation productos a base de hierbas, medicamentos de [place] o suplementos nutritivos que est tomando. Si usted fuma, consume bebidas alcohlicas o si utiliza drogas ilegales, indqueselo tambin a su profesional de [employer] Corporation. Algunas sustancias pueden interactuar con su medicamento. A qu debo estar atento al usar Coca-Cola? Se supervisar su estado de salud atentamente mientras reciba este medicamento. Este medicamento podra hacerle sentir un Nurse, mental health. Esto no es inusual, ya que la quimioterapia puede afectar tanto a las clulas sanas como a las clulas cancerosas. Si presenta algn efecto secundario, infrmelo. Contine con el tratamiento incluso si se siente enfermo, a menos que su equipo de [employer] lo suspenda. Este medicamento puede causar efectos secundarios graves. Para reducir Catering manager, su equipo de atencin puede darle otros medicamentos que deber usar antes de Health and safety inspector. Asegrese de seguir las instrucciones de su equipo de atencin. Este medicamento puede causar una erupcin cutnea o enrojecimiento en reas del cuerpo que previamente han recibido terapia de radiacin. Si ha recibido terapia de radiacin, informe a su equipo de atencin si nota una erupcin en esta rea. Este medicamento puede aumentar su riesgo de contraer una infeccin. Llame para pedir consejo a su equipo de atencin si tiene fiebre, escalofros, dolor de garganta o cualquier otro sntoma de resfriado o gripe. No se trate usted mismo. Trate de no acercarse a personas que estn enfermas. Proceda con cuidado al cepillar sus dientes, usar hilo dental o Risk manager palillos para los dientes, ya que podra contraer una infeccin o Therapist, art con mayor facilidad. Si recibe algn tratamiento dental, informe a su dentista que est [employer]. Evite usar  medicamentos que contengan aspirina, acetaminofeno, ibuprofeno, naproxeno o ketoprofeno, a menos que as lo indique su equipo de atencin. Estos medicamentos pueden ocultar la fiebre. Consulte con su equipo de atencin si tiene diarrea grave, nuseas y vmitos, o sudoracin intensa. La prdida de demasiado lquido corporal podra hacer que sea peligroso  usar Coca-Cola. Informe a su equipo de atencin si usted o su pareja est buscando un embarazo o si cree que alguna de las dos podra estar embarazada. Este medicamento puede causar defectos congnitos graves si se [place] durante el embarazo y por 6 meses despus de la ltima dosis. Se requiere una prueba de embarazo con resultado negativo antes de Medical laboratory scientific officer a Solicitor. Se recomienda Risk manager un mtodo anticonceptivo confiable mientras est usando este medicamento y durante 6 meses despus de la ltima dosis. Hable con su equipo de atencin [employer] mtodos anticonceptivos confiables. No embarace a Social research officer, government est usando este medicamento y durante 3 meses despus de la ltima dosis. Use un condn durante las relaciones sexuales que mantenga en [place]. No debe amamantar a un beb mientras [place] este medicamento y por 1 semana despus de la ltima dosis. Este medicamento podra causar infertilidad. Hable con su equipo de atencin si le preocupa su fertilidad. Qu efectos secundarios puedo tener al [employer] este medicamento? Efectos secundarios que debe informar a su equipo de atencin tan pronto como sea posible: Reacciones alrgicas: erupcin cutnea, comezn/picazn, urticaria, hinchazn de la cara, los labios, la lengua o la garganta Tos seca, falta de aire o problemas para respirar Infeccin: fiebre, escalofros, tos, dolor de garganta, heridas que no sanan, dolor o problemas para Garment/textile technologist, sensacin general de molestia o malestar [employer] riones: disminucin en la cantidad de orina, hinchazn de los tobillos, las manos o los pies Recuento bajo de glbulos rojos: debilidad o fatiga inusuales, mareo, Social research officer, government de cabeza, dificultad para respirar Enrojecimiento, formacin de ampollas, descamacin o distensin de la piel, incluso dentro de la boca Sangrado o moretones inusuales Efectos secundarios que generalmente no requieren atencin mdica (debe informarlos a su equipo de  atencin si persisten o si son molestos): Fatiga Prdida del apetito Nuseas Vmito Puede ser que esta lista no menciona todos los posibles efectos secundarios. Comunquese a su mdico por asesoramiento mdico Humana Inc. Usted puede informar los efectos secundarios a la FDA por telfono al 1-800-FDA-1088. Dnde debo guardar mi medicina? Este medicamento se administra en hospitales o clnicas. No se guarda en su casa. ATENCIN: Este folleto es un resumen. Puede ser que no cubra toda la posible informacin. Si usted tiene preguntas acerca de esta medicina, consulte con su mdico, su farmacutico o su profesional de Technical sales engineer.  2023 Elsevier/Gold Standard (2022-04-28 00:00:00)   Carboplatin Injection Qu es este medicamento? El CARBOPLATINO trata algunos tipos de cncer. Acta desacelerando el crecimiento de clulas cancerosas. Este medicamento puede ser utilizado para otros usos; si tiene alguna pregunta consulte con su proveedor de atencin mdica o con su farmacutico. MARCAS COMUNES: Paraplatin Qu le debo informar a mi profesional de la salud antes de tomar este medicamento? Necesitan saber si usted presenta alguno de los siguientes problemas o situaciones: Trastornos sanguneos Problemas auditivos Enfermedad renal Terapia de radiacin en curso o reciente Una reaccin alrgica o inusual al carboplatino, al cisplatino, a otros medicamentos, alimentos, colorantes o conservantes Si est embarazada o buscando quedar embarazada Si est amamantando a un beb Cmo debo Insurance account manager medicamento? Este medicamento se inyecta en una vena. Su equipo de  atencin lo [employer] en un hospital o en un entorno clnico. Hable con su equipo de atencin sobre el uso de este medicamento en nios. Puede requerir atencin especial. Sobredosis: Pngase en contacto inmediatamente con un centro toxicolgico o una sala de urgencia si usted cree que haya tomado demasiado  medicamento. ATENCIN: [employer] Foods es solo para usted. No comparta este medicamento con nadie. Qu sucede si me olvido de una dosis? Cumpla con las citas para dosis de seguimiento. Es importante no olvidar ninguna dosis. Llame a su equipo de atencin si no puede asistir a una cita. Qu puede interactuar con este medicamento? Medicamentos para convulsiones Algunos antibiticos, tales como [place], gentamicina, neomicina, estreptomicina, tobramicina Clinical biochemist Puede ser que esta lista no menciona todas las posibles interacciones. Informe a su profesional de KB Home	[place] de [employer] Corporation productos a base de hierbas, medicamentos de [place] o suplementos nutritivos que est tomando. Si usted fuma, consume bebidas alcohlicas o si utiliza drogas ilegales, indqueselo tambin a su profesional de KB Home	[place]. Algunas sustancias pueden interactuar con su medicamento. A qu debo estar atento al usar Coca-Cola? Se supervisar su estado de salud atentamente mientras reciba este medicamento. Usted podra necesitar realizarse [employer] Worldwide de sangre mientras est usando [place]. Este medicamento podra hacerle sentir un Nurse, mental health. Esto es normal, ya que la quimioterapia puede afectar tanto a las clulas sanas como a las clulas cancerosas. Si presenta algn efecto secundario, infrmelo. Contine con el tratamiento incluso si se siente enfermo, a menos que su equipo de H[employer] Inc lo suspenda. En algunos casos, podra recibir [employer]ands para ayudarle con los efectos secundarios. Siga todas las instrucciones para usarlos. Este medicamento puede aumentar su riesgo de contraer una infeccin. Llame para pedir consejo a su equipo de atencin si tiene fiebre, escalofros, dolor de garganta o cualquier otro sntoma de resfriado o gripe. No se trate usted mismo. Trate de no acercarse a personas que estn enfermas. Evite usar medicamentos que contengan aspirina, acetaminofeno,  ibuprofeno, naproxeno o ketoprofeno, a menos que as lo indique su equipo de atencin. Estos medicamentos pueden ocultar la fiebre. Proceda con cuidado al cepillar sus dientes, usar hilo dental o Risk manager palillos para los dientes, ya que podra contraer una infeccin o Therapist, art con mayor facilidad. Si recibe algn tratamiento dental, informe a su dentista que est [employer]. Informe a su equipo de atencin si est buscando un embarazo o si cree que podra estar embarazada. Este medicamento puede causar defectos congnitos graves. Hable con su equipo de atencin sobre mtodos anticonceptivos eficaces. No debe amamantar a un beb mientras est [employer]. Qu efectos secundarios puedo tener al [employer] este medicamento? Efectos secundarios que debe informar a su equipo de atencin tan pronto como sea posible: Reacciones alrgicas: erupcin cutnea, comezn/picazn, urticaria, hinchazn de la cara, los labios, la lengua o la garganta Infeccin: fiebre, escalofros, tos, dolor de garganta, heridas que no sanan, dolor o problemas para Garment/textile technologist, sensacin general de molestia o malestar Recuento bajo de glbulos rojos: debilidad o fatiga inusuales, mareo, dolor de cabeza, dificultad para Contractor, hormigueo o entumecimiento en las manos o los pies, debilidad muscular, cambios en la visin, confusin o dificultad para hablar, prdida del equilibrio o la coordinacin, dificultad para caminar, convulsiones Sangrado o moretones inusuales Efectos secundarios que generalmente no requieren atencin mdica (debe informarlos a su equipo de atencin si persisten o si son molestos): Cada del cabello Nuseas Debilidad o fatiga inusuales Vmito Puede ser que esta lista no menciona  todos los posibles efectos secundarios. Comunquese a su mdico por asesoramiento mdico Humana Inc. Usted puede informar los efectos secundarios a la FDA por telfono al  1-800-FDA-1088. Dnde debo guardar mi medicina? Este medicamento se administra en hospitales o clnicas. No se guarda en su casa. ATENCIN: Este folleto es un resumen. Puede ser que no cubra toda la posible informacin. Si usted tiene preguntas acerca de esta medicina, consulte con su mdico, su farmacutico o su profesional de Technical sales engineer.  2023 Elsevier/Gold Standard (2022-04-28 00:00:00)    Zoledronic Acid Injection (Cancer) Qu es este medicamento? El CIDO ZOLEDRNICO trata los niveles altos de calcio en la sangre causados por Science writer. Tambin podra usarse junto con la quimioterapia para tratar los [employer] Services debilitados a causa del Hotel manager. Acta desacelerando la liberacin de calcio de los [place]. Esto reduce los niveles de [employer] Inc. Tambin fortalece los huesos y hace que sea menos probable que se rompan (fracturen). Pertenece a un grupo de medicamentos llamados bifosfonatos. Este medicamento puede ser utilizado para otros usos; si tiene alguna pregunta consulte con su proveedor de atencin mdica o con su farmacutico. MARCAS COMUNES: Zometa, Zometa Powder Qu le debo informar a mi profesional de la salud antes de tomar este medicamento? Necesitan saber si usted presenta alguno de los siguientes problemas o situaciones: Deshidratacin Enfermedad dental Enfermedad renal Enfermedad heptica Niveles bajos de calcio en la sangre Enfermedad pulmonar o respiratoria, como asma Recibe medicamentos esteroideos, tales como dexametasona o prednisona Una reaccin alrgica o inusual al cido zoledrnico, a otros medicamentos, alimentos, colorantes o conservantes Si est embarazada o buscando quedar embarazada Si est amamantando a un beb Cmo debo utilizar este medicamento? Este medicamento se inyecta en una vena. Su equipo de atencin lo [employer] en un hospital o en un entorno clnico. Hable con su equipo de atencin sobre el uso de este medicamento en nios. Puede requerir  atencin especial. Sobredosis: Pngase en contacto inmediatamente con un centro toxicolgico o una sala de urgencia si usted cree que haya tomado demasiado medicamento. ATENCIN: [employer] Foods es solo para usted. No comparta este medicamento con nadie. Qu sucede si me olvido de una dosis? Cumpla con las citas para dosis de seguimiento. Es importante no olvidar ninguna dosis. Llame a su equipo de atencin si no puede asistir a una cita. Qu puede interactuar con este medicamento? Ciertos antibiticos inyectables Diurticos, tales como bumetanida, furosemida AINE, medicamentos para Conservation officer, historic buildings y Futures trader, tales como ibuprofeno o naproxeno Teriparatida Talidomida Puede ser que esta lista no menciona todas las posibles interacciones. Informe a su profesional de KB Home	[place] de [employer] Corporation productos a base de hierbas, medicamentos de [place] o suplementos nutritivos que est tomando. Si usted fuma, consume bebidas alcohlicas o si utiliza drogas ilegales, indqueselo tambin a su profesional de KB Home	[place]. Algunas sustancias pueden interactuar con su medicamento. A qu debo estar atento al usar Coca-Cola? Visite a su equipo de atencin para que revise su evolucin peridicamente. Es posible que pase un tiempo antes de que pueda notar los beneficios de [place]. Algunas personas que toman este medicamento tienen dolor intenso en huesos, articulaciones o msculos. Este medicamento tambin puede aumentar su riesgo de problemas en la mandbula o de tener una fractura de fmur. Informe a su equipo de atencin de inmediato si tiene dolor intenso en la [place], los [place], las articulaciones o los msculos. Informe a su equipo de atencin si tiene dolor que no desaparece o que empeora. Informe a su  dentista y a su Environmental education officer que est usando este medicamento. No deben realizarle [place] dental mayor mientras [place] este medicamento. Antes de comenzar a Solicitor, visite a  su dentista para Warehouse manager un examen y arregle Database administrator. Cuide bien sus dientes mientras [place] este medicamento. Asegrese de visitar a su dentista para citas de seguimiento peridicas. Debe asegurarse de recibir suficiente calcio y vitamina D mientras [place] este medicamento. Converse con su equipo de atencin [employer] Corporation que come y las vitaminas que toma. Consulte con su equipo de atencin si tiene diarrea grave, nuseas y vmitos, o sudoracin intensa. La prdida de demasiado lquido corporal podra hacer que sea peligroso usar Coca-Cola. Usted podra necesitar realizarse C.H. [employer] Worldwide de sangre mientras est usando [place]. Informe a su equipo de atencin si est buscando un embarazo o si cree que podra estar en embarazo. Este medicamento puede causar defectos congnitos graves. Qu efectos secundarios puedo tener al [employer] este medicamento? Efectos secundarios que debe informar a su equipo de atencin tan pronto como sea posible: Reacciones alrgicas: erupcin cutnea, comezn/picazn, urticaria, hinchazn de la cara, los labios, la lengua o la garganta Lesin en los riones: disminucin en la cantidad de orina, hinchazn de los tobillos, las manos o los pies Nivel bajo de calcio: Social research officer, government o calambres musculares, confusin, hormigueo o entumecimiento en manos o pies Osteonecrosis de la mandbula: dolor, hinchazn o enrojecimiento en la boca, entumecimiento de la [place], mala cicatrizacin despus de un trabajo odontolgico, secrecin inusual de la boca, huesos visibles en la boca Dolor intenso en los [place], las articulaciones o los msculos Efectos secundarios que generalmente no requieren atencin mdica (debe informarlos a su equipo de atencin si persisten o si son molestos): Estreimiento Health visitor Prdida del apetito Nuseas Dolor estomacal Puede ser que esta lista no menciona todos los posibles efectos secundarios. Comunquese a su mdico por  asesoramiento mdico [employer] Inc. Usted puede informar los efectos secundarios a la FDA por telfono al 1-800-FDA-1088. Dnde debo guardar mi medicina? Este medicamento se administra en hospitales o clnicas. No se guarda en su casa. ATENCIN: Este folleto es un resumen. Puede ser que no cubra toda la posible informacin. Si usted tiene preguntas acerca de esta medicina, consulte con su mdico, su farmacutico o su profesional de Technical sales engineer.  2023 Elsevier/Gold Standard (2021-11-04 00:00:00)

## 2022-08-18 NOTE — Progress Notes (Signed)
Patient seen by MD today  Vitals are within treatment parameters.  Labs reviewed: and are not all within treatment parameters. Dr Irene Limbo aware Ca: 8.5,  / OK for Zometa  Per physician team, patient is ready for treatment and there are NO modifications to the treatment plan./ Pt has no had chemo education. (Due to hospitalization. )

## 2022-08-18 NOTE — Progress Notes (Signed)
Spoke with desk nurse Ilda Foil. RN, no dental clearance needed at this time for her zometa treatment.

## 2022-08-19 ENCOUNTER — Telehealth: Payer: Self-pay | Admitting: Physician Assistant

## 2022-08-19 ENCOUNTER — Telehealth: Payer: Self-pay

## 2022-08-19 ENCOUNTER — Telehealth: Payer: Self-pay | Admitting: Hematology

## 2022-08-19 NOTE — Telephone Encounter (Signed)
Called patient's spouse and left voicemail about new appointment. Instructed spouse and patient to call back with any appointment concerns.

## 2022-08-19 NOTE — Transitions of Care (Post Inpatient/ED Visit) (Signed)
   08/19/2022  Name: Karen [name] MRN: [id number] DOB: 1965/09/22  Today's TOC FU Call Status: Today's TOC FU Call Status:: Successful TOC FU Call Competed Unsuccessful Call (1st Attempt) Date: 08/18/22 [facility] FU Call Complete Date: 08/19/22  Transition Care Management Follow-up Telephone Call Date of Discharge: 08/17/22 Discharge Facility: [facility]) Type of Discharge: Inpatient Admission Primary Inpatient Discharge Diagnosis:: parasthesia of skin How have you been since you were released from the hospital?: Better Any questions or concerns?: No  Items Reviewed: Did you receive and understand the discharge instructions provided?: Yes Medications obtained and verified?: Yes (Medications Reviewed) Any new allergies since your discharge?: No Dietary orders reviewed?: NA Do you have support at home?: Yes People in Home: spouse  Home Care and Equipment/Supplies: [place] Ordered?: NA Any new equipment or medical supplies ordered?: NA  Functional Questionnaire: Do you need assistance with bathing/showering or dressing?: No Do you need assistance with meal preparation?: No Do you need assistance with eating?: No Do you have difficulty maintaining continence: No Do you need assistance with getting out of bed/getting out of a chair/moving?: No Do you have difficulty managing or taking your medications?: No  Folllow up appointments reviewed: PCP Follow-up appointment confirmed?: No MD Provider Line Number:[phone] Given: Yes (patient will call later to schedule) [facility] Follow-up appointment confirmed?: No Reason Specialist Follow-Up Not Confirmed: Patient has Specialist Provider Number and will Call for Appointment Do you need transportation to your follow-up appointment?: No Do you understand care options if your condition(s) worsen?: Yes-patient verbalized understanding    [place], [place] [facility]  Advisor Direct Dial [phone]

## 2022-08-19 NOTE — Telephone Encounter (Signed)
Karen [name]  states is is feeling a little better today.  She is eating small amounts of food at time.  She is trying to drink as much fluid as possible. She may be taking in ~32 oz. She knows to call the office at [phone] if she has any questions or concerns.

## 2022-08-19 NOTE — Telephone Encounter (Signed)
-----   Message from Tildon Husky, RN sent at 08/18/2022  4:14 PM EST ----- Regarding: first time treatment call back- Karen [name] Patient received treatment today. She is followed by Dr. Irene Limbo. She received alimta and carboplatin. No issues patient tolerated well

## 2022-08-19 NOTE — Telephone Encounter (Signed)
Per 2/29 IB reached out to schedule patient.

## 2022-08-20 ENCOUNTER — Inpatient Hospital Stay: Payer: Commercial Managed Care - HMO | Attending: Hematology

## 2022-08-20 DIAGNOSIS — F32A Depression, unspecified: Secondary | ICD-10-CM | POA: Insufficient documentation

## 2022-08-20 DIAGNOSIS — F419 Anxiety disorder, unspecified: Secondary | ICD-10-CM | POA: Insufficient documentation

## 2022-08-20 DIAGNOSIS — C3490 Malignant neoplasm of unspecified part of unspecified bronchus or lung: Secondary | ICD-10-CM | POA: Insufficient documentation

## 2022-08-20 DIAGNOSIS — G43909 Migraine, unspecified, not intractable, without status migrainosus: Secondary | ICD-10-CM | POA: Insufficient documentation

## 2022-08-20 DIAGNOSIS — I10 Essential (primary) hypertension: Secondary | ICD-10-CM | POA: Insufficient documentation

## 2022-08-20 DIAGNOSIS — Z79899 Other long term (current) drug therapy: Secondary | ICD-10-CM | POA: Insufficient documentation

## 2022-08-20 DIAGNOSIS — K589 Irritable bowel syndrome without diarrhea: Secondary | ICD-10-CM | POA: Insufficient documentation

## 2022-08-20 DIAGNOSIS — G893 Neoplasm related pain (acute) (chronic): Secondary | ICD-10-CM | POA: Insufficient documentation

## 2022-08-20 LAB — T4: T4, Total: 5.9 ug/dL (ref 4.5–12.0)

## 2022-08-20 NOTE — Progress Notes (Signed)
[place] CSW Progress Note  Clinical Education officer, museum contacted patient by phone to assess needs.  Utilized Administrator, sports.  Patient stated that she did not feel well today and the conversation was shortened.  CSW offered support and she stated she felt well emotionally.  She agreed to have CSW contact her in the future.    Rodman Pickle Graison Leinberger, LCSW

## 2022-08-21 ENCOUNTER — Other Ambulatory Visit: Payer: Self-pay

## 2022-08-21 ENCOUNTER — Emergency Department (HOSPITAL_COMMUNITY): Payer: Commercial Managed Care - HMO

## 2022-08-21 ENCOUNTER — Inpatient Hospital Stay (HOSPITAL_COMMUNITY)
Admission: EM | Admit: 2022-08-21 | Discharge: 2022-09-07 | DRG: 180 | Disposition: A | Discharge status: Home / Self Care | Payer: Commercial Managed Care - HMO | Attending: Internal Medicine | Admitting: Internal Medicine

## 2022-08-21 ENCOUNTER — Encounter (HOSPITAL_COMMUNITY): Payer: Self-pay

## 2022-08-21 DIAGNOSIS — F411 Generalized anxiety disorder: Secondary | ICD-10-CM

## 2022-08-21 DIAGNOSIS — J90 Pleural effusion, not elsewhere classified: Secondary | ICD-10-CM | POA: Diagnosis not present

## 2022-08-21 DIAGNOSIS — D72829 Elevated white blood cell count, unspecified: Secondary | ICD-10-CM | POA: Diagnosis present

## 2022-08-21 DIAGNOSIS — R300 Dysuria: Secondary | ICD-10-CM | POA: Diagnosis not present

## 2022-08-21 DIAGNOSIS — Z681 Body mass index (BMI) 19 or less, adult: Secondary | ICD-10-CM

## 2022-08-21 DIAGNOSIS — R54 Age-related physical debility: Secondary | ICD-10-CM | POA: Diagnosis present

## 2022-08-21 DIAGNOSIS — G43909 Migraine, unspecified, not intractable, without status migrainosus: Secondary | ICD-10-CM | POA: Diagnosis present

## 2022-08-21 DIAGNOSIS — J9383 Other pneumothorax: Secondary | ICD-10-CM | POA: Diagnosis present

## 2022-08-21 DIAGNOSIS — C3490 Malignant neoplasm of unspecified part of unspecified bronchus or lung: Secondary | ICD-10-CM | POA: Diagnosis not present

## 2022-08-21 DIAGNOSIS — Z888 Allergy status to other drugs, medicaments and biological substances status: Secondary | ICD-10-CM

## 2022-08-21 DIAGNOSIS — M549 Dorsalgia, unspecified: Secondary | ICD-10-CM | POA: Diagnosis present

## 2022-08-21 DIAGNOSIS — Z8249 Family history of ischemic heart disease and other diseases of the circulatory system: Secondary | ICD-10-CM

## 2022-08-21 DIAGNOSIS — R630 Anorexia: Secondary | ICD-10-CM

## 2022-08-21 DIAGNOSIS — E876 Hypokalemia: Secondary | ICD-10-CM | POA: Diagnosis present

## 2022-08-21 DIAGNOSIS — R053 Chronic cough: Secondary | ICD-10-CM | POA: Diagnosis present

## 2022-08-21 DIAGNOSIS — Z7902 Long term (current) use of antithrombotics/antiplatelets: Secondary | ICD-10-CM

## 2022-08-21 DIAGNOSIS — E43 Unspecified severe protein-calorie malnutrition: Secondary | ICD-10-CM | POA: Diagnosis present

## 2022-08-21 DIAGNOSIS — J3800 Paralysis of vocal cords and larynx, unspecified: Secondary | ICD-10-CM | POA: Diagnosis present

## 2022-08-21 DIAGNOSIS — Z7189 Other specified counseling: Secondary | ICD-10-CM

## 2022-08-21 DIAGNOSIS — Z8673 Personal history of transient ischemic attack (TIA), and cerebral infarction without residual deficits: Secondary | ICD-10-CM

## 2022-08-21 DIAGNOSIS — I10 Essential (primary) hypertension: Secondary | ICD-10-CM | POA: Diagnosis present

## 2022-08-21 DIAGNOSIS — Z91419 Personal history of unspecified adult abuse: Secondary | ICD-10-CM

## 2022-08-21 DIAGNOSIS — Z8616 Personal history of COVID-19: Secondary | ICD-10-CM

## 2022-08-21 DIAGNOSIS — J910 Malignant pleural effusion: Secondary | ICD-10-CM | POA: Diagnosis present

## 2022-08-21 DIAGNOSIS — Z1152 Encounter for screening for COVID-19: Secondary | ICD-10-CM

## 2022-08-21 DIAGNOSIS — Z79899 Other long term (current) drug therapy: Secondary | ICD-10-CM

## 2022-08-21 DIAGNOSIS — R112 Nausea with vomiting, unspecified: Secondary | ICD-10-CM | POA: Diagnosis present

## 2022-08-21 DIAGNOSIS — Z8711 Personal history of peptic ulcer disease: Secondary | ICD-10-CM

## 2022-08-21 DIAGNOSIS — G43109 Migraine with aura, not intractable, without status migrainosus: Secondary | ICD-10-CM | POA: Diagnosis present

## 2022-08-21 DIAGNOSIS — E785 Hyperlipidemia, unspecified: Secondary | ICD-10-CM | POA: Diagnosis present

## 2022-08-21 DIAGNOSIS — C7951 Secondary malignant neoplasm of bone: Secondary | ICD-10-CM | POA: Diagnosis present

## 2022-08-21 DIAGNOSIS — E860 Dehydration: Secondary | ICD-10-CM | POA: Diagnosis present

## 2022-08-21 DIAGNOSIS — T451X5A Adverse effect of antineoplastic and immunosuppressive drugs, initial encounter: Secondary | ICD-10-CM | POA: Diagnosis present

## 2022-08-21 DIAGNOSIS — Z7982 Long term (current) use of aspirin: Secondary | ICD-10-CM

## 2022-08-21 DIAGNOSIS — K219 Gastro-esophageal reflux disease without esophagitis: Secondary | ICD-10-CM | POA: Diagnosis present

## 2022-08-21 DIAGNOSIS — Z515 Encounter for palliative care: Principal | ICD-10-CM

## 2022-08-21 DIAGNOSIS — N3941 Urge incontinence: Secondary | ICD-10-CM | POA: Diagnosis present

## 2022-08-21 DIAGNOSIS — Z5982 Transportation insecurity: Secondary | ICD-10-CM

## 2022-08-21 DIAGNOSIS — G893 Neoplasm related pain (acute) (chronic): Secondary | ICD-10-CM | POA: Diagnosis present

## 2022-08-21 DIAGNOSIS — Z8615 Personal history of latent tuberculosis infection: Secondary | ICD-10-CM

## 2022-08-21 DIAGNOSIS — J948 Other specified pleural conditions: Secondary | ICD-10-CM | POA: Diagnosis present

## 2022-08-21 DIAGNOSIS — E871 Hypo-osmolality and hyponatremia: Secondary | ICD-10-CM | POA: Diagnosis present

## 2022-08-21 DIAGNOSIS — D638 Anemia in other chronic diseases classified elsewhere: Secondary | ICD-10-CM | POA: Diagnosis present

## 2022-08-21 DIAGNOSIS — F064 Anxiety disorder due to known physiological condition: Secondary | ICD-10-CM | POA: Diagnosis present

## 2022-08-21 DIAGNOSIS — K589 Irritable bowel syndrome without diarrhea: Secondary | ICD-10-CM | POA: Diagnosis present

## 2022-08-21 DIAGNOSIS — F339 Major depressive disorder, recurrent, unspecified: Secondary | ICD-10-CM | POA: Diagnosis present

## 2022-08-21 DIAGNOSIS — Z90710 Acquired absence of both cervix and uterus: Secondary | ICD-10-CM

## 2022-08-21 DIAGNOSIS — G894 Chronic pain syndrome: Secondary | ICD-10-CM | POA: Diagnosis present

## 2022-08-21 LAB — URINALYSIS, ROUTINE W REFLEX MICROSCOPIC
Bilirubin Urine: NEGATIVE
Glucose, UA: NEGATIVE mg/dL
Hgb urine dipstick: NEGATIVE
Ketones, ur: 5 mg/dL — AB
Nitrite: NEGATIVE
Protein, ur: NEGATIVE mg/dL
Specific Gravity, Urine: >1.046 — ABNORMAL HIGH (ref 1.005–1.030)
pH: 7 (ref 5.0–8.0)

## 2022-08-21 LAB — CBC
HCT: 39.6 % (ref 36.0–46.0)
Hemoglobin: 13.5 g/dL (ref 12.0–15.0)
MCH: 31 pg (ref 26.0–34.0)
MCHC: 34.1 g/dL (ref 30.0–36.0)
MCV: 91 fL (ref 80.0–100.0)
Platelets: 469 10*3/uL — ABNORMAL HIGH (ref 150–400)
RBC: 4.35 MIL/uL (ref 3.87–5.11)
RDW: 12.2 % (ref 11.5–15.5)
WBC: 21.1 10*3/uL — ABNORMAL HIGH (ref 4.0–10.5)
nRBC: 0 % (ref 0.0–0.2)

## 2022-08-21 LAB — COMPREHENSIVE METABOLIC PANEL
ALT: 54 U/L — ABNORMAL HIGH (ref 0–44)
AST: 34 U/L (ref 15–41)
Albumin: 4 g/dL (ref 3.5–5.0)
Alkaline Phosphatase: 129 U/L — ABNORMAL HIGH (ref 38–126)
Anion gap: 10 (ref 5–15)
BUN: 16 mg/dL (ref 6–20)
CO2: 18 mmol/L — ABNORMAL LOW (ref 22–32)
Calcium: 8 mg/dL — ABNORMAL LOW (ref 8.9–10.3)
Chloride: 105 mmol/L (ref 98–111)
Creatinine, Ser: 0.54 mg/dL (ref 0.44–1.00)
GFR, Estimated: >60 mL/min (ref >60)
Glucose, Bld: 124 mg/dL — ABNORMAL HIGH (ref 70–99)
Potassium: 3.3 mmol/L — ABNORMAL LOW (ref 3.5–5.1)
Sodium: 133 mmol/L — ABNORMAL LOW (ref 135–145)
Total Bilirubin: 0.7 mg/dL (ref 0.3–1.2)
Total Protein: 7.5 g/dL (ref 6.5–8.1)

## 2022-08-21 LAB — MAGNESIUM: Magnesium: 2.4 mg/dL (ref 1.7–2.4)

## 2022-08-21 LAB — LACTIC ACID, PLASMA: Lactic Acid, Venous: 1.2 mmol/L (ref 0.5–1.9)

## 2022-08-21 LAB — TROPONIN I (HIGH SENSITIVITY)
Troponin I (High Sensitivity): 3 ng/L (ref <18)
Troponin I (High Sensitivity): 3 ng/L (ref <18)

## 2022-08-21 LAB — LIPASE, BLOOD: Lipase: 46 U/L (ref 11–51)

## 2022-08-21 LAB — RESP PANEL BY RT-PCR (RSV, FLU A&B, COVID)  RVPGX2
Influenza A by PCR: NEGATIVE
Influenza B by PCR: NEGATIVE
Resp Syncytial Virus by PCR: NEGATIVE
SARS Coronavirus 2 by RT PCR: NEGATIVE

## 2022-08-21 LAB — BRAIN NATRIURETIC PEPTIDE: B Natriuretic Peptide: 36.3 pg/mL (ref 0.0–100.0)

## 2022-08-21 MED ORDER — ONDANSETRON HCL 4 MG/2ML IJ SOLN
4.0000 mg | Freq: Once | INTRAMUSCULAR | Status: AC | PRN
  Administered 2022-08-21: 4 mg via INTRAVENOUS
  Filled 2022-08-21: qty 2

## 2022-08-21 MED ORDER — MORPHINE SULFATE (PF) 4 MG/ML IV SOLN
4.0000 mg | Freq: Once | INTRAVENOUS | Status: AC
  Administered 2022-08-21: 4 mg via INTRAVENOUS
  Filled 2022-08-21: qty 1

## 2022-08-21 MED ORDER — POTASSIUM CHLORIDE 10 MEQ/100ML IV SOLN
10.0000 meq | Freq: Once | INTRAVENOUS | Status: AC
  Administered 2022-08-21: 10 meq via INTRAVENOUS
  Filled 2022-08-21: qty 100

## 2022-08-21 MED ORDER — MORPHINE SULFATE (PF) 2 MG/ML IV SOLN
2.0000 mg | INTRAVENOUS | Status: DC | PRN
  Administered 2022-08-22 (×3): 2 mg via INTRAVENOUS
  Filled 2022-08-21 (×3): qty 1

## 2022-08-21 MED ORDER — IOHEXOL 350 MG/ML SOLN
75.0000 mL | Freq: Once | INTRAVENOUS | Status: AC | PRN
  Administered 2022-08-21: 75 mL via INTRAVENOUS

## 2022-08-21 MED ORDER — LORAZEPAM 2 MG/ML IJ SOLN
0.5000 mg | Freq: Once | INTRAMUSCULAR | Status: AC
  Administered 2022-08-21: 0.5 mg via INTRAVENOUS
  Filled 2022-08-21: qty 1

## 2022-08-21 MED ORDER — SODIUM CHLORIDE 0.9 % IV BOLUS
1000.0000 mL | Freq: Once | INTRAVENOUS | Status: AC
  Administered 2022-08-21: 1000 mL via INTRAVENOUS

## 2022-08-21 MED ORDER — SODIUM CHLORIDE (PF) 0.9 % IJ SOLN
INTRAMUSCULAR | Status: AC
  Filled 2022-08-21: qty 50

## 2022-08-21 NOTE — ED Provider Notes (Signed)
[facility] Provider Note   CSN: [id number] Arrival date & time: 08/21/22  1910     History  Chief Complaint  Patient presents with   Chest Pain   Shortness of Breath   Weakness   Nausea    Karen [name] is a 57 y.o. female.  57 year old female with prior medical history as detailed below presents for evaluation.  Patient complains of persistent nausea, vomiting, chest pain, shortness of breath.  Symptoms began over the last several days after recent chemo treatment.  Patient is visibly uncomfortable on initial evaluation.  She is actively retching.  She reports that she has been unable to tolerate p.o. or medications for the last 2 days.  She denies fever.  The history is provided by the patient and medical records.       Home Medications Prior to Admission medications   Medication Sig Start Date End Date Taking? Authorizing Provider  albuterol (VENTOLIN HFA) 108 (90 Base) MCG/ACT inhaler Inhale 2 puffs into the lungs every 6 (six) hours as needed for wheezing or shortness of breath. 05/31/22   [name], MD  aspirin EC 81 MG tablet Take 1 tablet (81 mg total) by mouth daily. Swallow whole. 08/17/22   Linus Galas, MD  clonazePAM (KLONOPIN) 1 MG tablet Take 1 tablet (1 mg total) by mouth 2 (two) times daily. 08/17/22   Linus Galas, MD  clopidogrel (PLAVIX) 75 MG tablet Take 1 tablet (75 mg total) by mouth daily. 08/18/22   Linus Galas, MD  dexamethasone (DECADRON) 4 MG tablet Take 1 tab 2 times daily starting day before pemetrexed. Then take 2 tabs daily x 3 days starting day after carboplatin. Take with food. 08/05/22   Brunetta Genera, MD  diclofenac Sodium (VOLTAREN) 1 % GEL Apply 4 g topically 4 (four) times daily. Patient taking differently: Apply 4 g topically daily as needed. 08/04/22   Linus Galas, MD  ergocalciferol (VITAMIN D2) 1.25 MG (50000 UT) capsule Take 1  capsule (50,000 Units total) by mouth once a week. 08/18/22   Brunetta Genera, MD  folic acid (FOLVITE) 1 MG tablet Take 1 tablet (1 mg total) by mouth daily. Start 7 days before pemetrexed chemotherapy. Continue until 21 days after pemetrexed completed. 08/05/22   Brunetta Genera, MD  HYDROcodone-acetaminophen (NORCO/VICODIN) 5-325 MG tablet Take 1-2 tablets by mouth every 6 (six) hours as needed for moderate pain or severe pain. Do not drive after taking [id number]   Pickenpack-Cousar, Carlena Sax, NP  lidocaine (XYLOCAINE) 2 % solution Use as directed 15 mLs in the mouth or throat every 4 (four) hours as needed for mouth pain (sore throat). 08/04/22   Linus Galas, MD  lidocaine-prilocaine (EMLA) cream Apply to affected area once Patient taking differently: Apply 1 Application topically daily as needed (For pain). 08/05/22   Brunetta Genera, MD  ondansetron (ZOFRAN-ODT) 8 MG disintegrating tablet Take 1 tablet (8 mg total) by mouth every 8 (eight) hours as needed for nausea or vomiting. 08/04/22   Linus Galas, MD  prochlorperazine (COMPAZINE) 10 MG tablet Take 1 tablet (10 mg total) by mouth every 6 (six) hours as needed for refractory nausea / vomiting. 08/04/22   Linus Galas, MD  propranolol (INDERAL) 40 MG tablet Take 1 tablet (40 mg total) by mouth 2 (two) times daily. Patient not taking: Reported on 08/19/2022 08/04/22   Linus Galas, MD  rosuvastatin (CRESTOR) 10 MG tablet Take 1 tablet (10 mg total) by  mouth daily. 08/17/22   Linus Galas, MD  sertraline (ZOLOFT) 100 MG tablet Take 100 mg by mouth 2 (two) times daily. 06/24/22   [provider]  traZODone (DESYREL) 150 MG tablet Take 1 tablet (150 mg total) by mouth at bedtime as needed for sleep. 08/17/22   Linus Galas, MD  trimethobenzamide (TIGAN) 300 MG capsule Take 1 capsule (300 mg total) by mouth 3 (three) times daily as needed for nausea/vomiting. Tome 1 capsula (300  mg en total) por via oral 3 (tres) veces al dia segun sea necesario para las nauseas/vomitos. 08/12/22 08/12/23  [name], MD      Allergies    Lidocaine, Percocet [oxycodone-acetaminophen], Hydromorphone hcl, and Oxycodone-acetaminophen    Review of Systems   Review of Systems  All other systems reviewed and are negative.   Physical Exam Updated Vital Signs BP 139/87   Pulse 96   Temp 99 F (37.2 C)   Resp (!) 24   Ht '5\' 2"'$  (1.575 m)   Wt 46.3 kg   LMP 02/08/2006   SpO2 94%   BMI 18.66 kg/m  Physical Exam Vitals and nursing note reviewed.  Constitutional:      General: She is not in acute distress.    Appearance: Normal appearance.  HENT:     Head: Normocephalic and atraumatic.  Eyes:     Conjunctiva/sclera: Conjunctivae normal.     Pupils: Pupils are equal, round, and reactive to light.  Cardiovascular:     Rate and Rhythm: Regular rhythm. Tachycardia present.     Heart sounds: Normal heart sounds.  Pulmonary:     Effort: Pulmonary effort is normal. Tachypnea present. No respiratory distress.     Breath sounds: Normal breath sounds.  Abdominal:     General: There is no distension.     Palpations: Abdomen is soft.     Tenderness: There is no abdominal tenderness.  Musculoskeletal:        General: No deformity. Normal range of motion.     Cervical back: Normal range of motion and neck supple.  Skin:    General: Skin is warm and dry.  Neurological:     General: No focal deficit present.     Mental Status: She is alert and oriented to person, place, and time.     ED Results / Procedures / Treatments   Labs (all labs ordered are listed, but only abnormal results are displayed) Labs Reviewed  COMPREHENSIVE METABOLIC PANEL - Abnormal; Notable for the following components:      Result Value   Sodium 133 (*)    Potassium 3.3 (*)    CO2 18 (*)    Glucose, Bld 124 (*)    Calcium 8.0 (*)    ALT 54 (*)    Alkaline Phosphatase 129 (*)    All other components  within normal limits  CBC - Abnormal; Notable for the following components:   WBC 21.1 (*)    Platelets 469 (*)    All other components within normal limits  URINALYSIS, ROUTINE W REFLEX MICROSCOPIC - Abnormal; Notable for the following components:   Specific Gravity, Urine >1.046 (*)    Ketones, ur 5 (*)    Leukocytes,Ua TRACE (*)    Bacteria, UA RARE (*)    All other components within normal limits  CULTURE, BLOOD (ROUTINE X 2)  RESP PANEL BY RT-PCR (RSV, FLU A&B, COVID)  RVPGX2  CULTURE, BLOOD (ROUTINE X 2)  LIPASE, BLOOD  BRAIN NATRIURETIC PEPTIDE  LACTIC  ACID, PLASMA  MAGNESIUM  TROPONIN I (HIGH SENSITIVITY)  TROPONIN I (HIGH SENSITIVITY)    EKG None  Radiology CT Angio Chest PE W and/or Wo Contrast  Result Date: 08/21/2022 CLINICAL DATA:  Concern for pulmonary embolism. History hands cold post noted it cold EXAM: CT ANGIOGRAPHY CHEST WITH CONTRAST TECHNIQUE: Multidetector CT imaging of the chest was performed using the standard protocol during bolus administration of intravenous contrast. Multiplanar CT image reconstructions and MIPs were obtained to evaluate the vascular anatomy. RADIATION DOSE REDUCTION: This exam was performed according to the departmental dose-optimization program which includes automated exposure control, adjustment of the mA and/or kV according to patient size and/or use of iterative reconstruction technique. CONTRAST:  61m OMNIPAQUE IOHEXOL 350 MG/ML SOLN COMPARISON:  Chest CT dated 06/25/2022 and radiograph dated 08/21/2022. FINDINGS: Evaluation of this exam is limited due to respiratory motion artifact. Cardiovascular: There is no cardiomegaly small pericardial effusion, new since the prior CT and measuring 1 cm in thickness. The thoracic aorta is unremarkable. The origins of the great vessels of the aortic arch appear patent. Evaluation of the pulmonary arteries is limited due to respiratory motion. No central pulmonary artery embolus identified.  Mediastinum/Nodes: There is infiltrative soft tissue the mediastinum and bilateral hila in keeping with known malignancy. No mediastinal fluid collection. Right-sided Port-A-Cath with tip at the cavoatrial junction. Lungs/Pleura: There is a moderate size right and trace left pleural effusions, new since the prior CT and suspicious for malignant effusion. There is partial compressive atelectasis of the right lower lobe. Patchy area of consolidation in the right perihilar region extending into the right upper lobe and right middle lobes may represent combination of tumor infiltration, atelectasis, or pneumonia. There is however diffuse interstitial thickening and nodularity primarily involving the upper lobes bilaterally as well as in the lower lobes suspicious for lymphangitic carcinomatosis. There is no pneumothorax. There is narrowing of the right upper lobe bronchus as well as narrowing of the right middle and right lower lobe bronchi. The central airways remain patent. Upper Abdomen: Small calcified splenic granuloma. Musculoskeletal: Sclerotic lesions involving T10, T12, and L3 consistent with metastatic disease. No acute osseous pathology. Review of the MIP images confirms the above findings. IMPRESSION: 1. No CT evidence of central pulmonary artery embolus. 2. Moderate size right and trace left pleural effusions, new since the prior CT and suspicious for malignant effusion. 3. Infiltrative mediastinal and hilar mass in keeping with known malignancy. 4. Significant progression of disease with bilateral perihilar consolidation and findings 5. suspicious for lymphangitic carcinomatosis. 6. Sclerotic lesions involving T10, T12, and L3 consistent with metastatic disease. Electronically Signed   By: AAnner CreteM.D.   On: 08/21/2022 20:53   DG Chest Port 1 View  Result Date: 08/21/2022 CLINICAL DATA:  Shortness of breath EXAM: PORTABLE CHEST 1 VIEW COMPARISON:  PET-CT dated July 29, 2022 and CT  examination dated August 13, 2022 FINDINGS: The heart is normal in size. Pulmonary vascular congestion with bilateral perihilar opacities consistent with patient's known hilar lymphadenopathy. Small bilateral pleural effusions. Right IJ access MediPort with distal tip in the SVC no acute osseous abnormality IMPRESSION: 1. Pulmonary vascular congestion with bilateral perihilar opacities consistent with patient's known hilar lymphadenopathy. 2. Small bilateral pleural effusions. Electronically Signed   By: IKeane PoliceD.O.   On: 08/21/2022 19:59    Procedures Procedures    Medications Ordered in ED Medications  sodium chloride (PF) 0.9 % injection (has no administration in time range)  ondansetron (ZOFRAN) injection 4  mg (4 mg Intravenous Given 08/21/22 1941)  sodium chloride 0.9 % bolus 1,000 mL (1,000 mLs Intravenous New Bag/Given 08/21/22 1954)  morphine (PF) 4 MG/ML injection 4 mg (4 mg Intravenous Given 08/21/22 1941)  potassium chloride 10 mEq in 100 mL IVPB (0 mEq Intravenous Stopped 08/21/22 2210)  iohexol (OMNIPAQUE) 350 MG/ML injection 75 mL (75 mLs Intravenous Contrast Given 08/21/22 2034)  LORazepam (ATIVAN) injection 0.5 mg (0.5 mg Intravenous Given 08/21/22 2124)    ED Course/ Medical Decision Making/ A&P                             Medical Decision Making Amount and/or Complexity of Data Reviewed Labs: ordered. Radiology: ordered.  Risk Prescription drug management.    Medical Screen Complete  This patient presented to the ED with complaint of nausea, vomiting, chest pain, sob.  This complaint involves an extensive number of treatment options. The initial differential diagnosis includes, but is not limited to, metabolic abnormality, PE, pneumonia, etc.  This presentation is: Acute, Chronic, Self-Limited, Previously Undiagnosed, Uncertain Prognosis, Complicated, Systemic Symptoms, and Threat to Life/Bodily Function  Patient with known history of metastatic lung  adenocarcinoma presents with complaint of nausea, vomiting, chest discomfort, shortness of breath.  Symptoms appear to be associated with recent chemotherapy treatment.  Patient with active vomiting on arrival.  Screening labs obtained are reassuring.  With antiemetics and IV fluids the patient feels moderately improved.  However she reports that she has continued nausea and would prefer admission.  Hospitalist service Alcario Drought) aware of case and will evaluate for admission.  Additional history obtained:  External records from outside sources obtained and reviewed including prior ED visits and prior Inpatient records.    Lab Tests:  I ordered and personally interpreted labs.  The pertinent results include: CBC, CMP, lipase, COVID, flu, UA, lactic acid ,    Imaging Studies ordered:  I ordered imaging studies including CT angio chest plain, chest I independently visualized and interpreted obtained imaging which showed no PE, no evidence of acute infiltrate I agree with the radiologist interpretation.   Cardiac Monitoring:  The patient was maintained on a cardiac monitor.  I personally viewed and interpreted the cardiac monitor which showed an underlying rhythm of: NSR   Medicines ordered:  I ordered medication including potassium, Ativan, morphine, IV fluids, Zofran for pain, nausea Reevaluation of the patient after these medicines showed that the patient: improved  Problem List / ED Course:  Nausea, vomiting   Reevaluation:  After the interventions noted above, I reevaluated the patient and found that they have: improved   Disposition:  After consideration of the diagnostic results and the patients response to treatment, I feel that the patent would benefit from admission.          Final Clinical Impression(s) / ED Diagnoses Final diagnoses:  Nausea and vomiting, unspecified vomiting type    Rx / DC Orders ED Discharge Orders     None          Valarie Merino, MD 08/21/22 2342

## 2022-08-21 NOTE — Assessment & Plan Note (Addendum)
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 Morphine PRN for the moment Pal care consult in AM.

## 2022-08-21 NOTE — ED Triage Notes (Signed)
Pt BIB [facility] for N/V, CP, SHOB, after her last chemo treatment on Thursday.  4 zofran, 1 albuterol neb VS: 129/61 HR 101 96% CBG 124

## 2022-08-22 ENCOUNTER — Observation Stay (HOSPITAL_COMMUNITY): Payer: Commercial Managed Care - HMO

## 2022-08-22 DIAGNOSIS — G893 Neoplasm related pain (acute) (chronic): Secondary | ICD-10-CM | POA: Diagnosis not present

## 2022-08-22 DIAGNOSIS — I10 Essential (primary) hypertension: Secondary | ICD-10-CM | POA: Diagnosis present

## 2022-08-22 DIAGNOSIS — E860 Dehydration: Secondary | ICD-10-CM | POA: Diagnosis present

## 2022-08-22 DIAGNOSIS — J910 Malignant pleural effusion: Secondary | ICD-10-CM | POA: Diagnosis present

## 2022-08-22 DIAGNOSIS — J3800 Paralysis of vocal cords and larynx, unspecified: Secondary | ICD-10-CM | POA: Diagnosis present

## 2022-08-22 DIAGNOSIS — E871 Hypo-osmolality and hyponatremia: Secondary | ICD-10-CM | POA: Diagnosis present

## 2022-08-22 DIAGNOSIS — R112 Nausea with vomiting, unspecified: Secondary | ICD-10-CM | POA: Diagnosis not present

## 2022-08-22 DIAGNOSIS — D638 Anemia in other chronic diseases classified elsewhere: Secondary | ICD-10-CM | POA: Diagnosis present

## 2022-08-22 DIAGNOSIS — D72829 Elevated white blood cell count, unspecified: Secondary | ICD-10-CM | POA: Diagnosis present

## 2022-08-22 DIAGNOSIS — R630 Anorexia: Secondary | ICD-10-CM | POA: Diagnosis not present

## 2022-08-22 DIAGNOSIS — C3490 Malignant neoplasm of unspecified part of unspecified bronchus or lung: Secondary | ICD-10-CM | POA: Diagnosis present

## 2022-08-22 DIAGNOSIS — Z7189 Other specified counseling: Secondary | ICD-10-CM | POA: Diagnosis not present

## 2022-08-22 DIAGNOSIS — E876 Hypokalemia: Secondary | ICD-10-CM | POA: Diagnosis present

## 2022-08-22 DIAGNOSIS — E785 Hyperlipidemia, unspecified: Secondary | ICD-10-CM | POA: Diagnosis present

## 2022-08-22 DIAGNOSIS — Z681 Body mass index (BMI) 19 or less, adult: Secondary | ICD-10-CM | POA: Diagnosis not present

## 2022-08-22 DIAGNOSIS — J90 Pleural effusion, not elsewhere classified: Secondary | ICD-10-CM | POA: Diagnosis present

## 2022-08-22 DIAGNOSIS — Z8616 Personal history of COVID-19: Secondary | ICD-10-CM | POA: Diagnosis not present

## 2022-08-22 DIAGNOSIS — F339 Major depressive disorder, recurrent, unspecified: Secondary | ICD-10-CM | POA: Diagnosis present

## 2022-08-22 DIAGNOSIS — Z1152 Encounter for screening for COVID-19: Secondary | ICD-10-CM | POA: Diagnosis not present

## 2022-08-22 DIAGNOSIS — G894 Chronic pain syndrome: Secondary | ICD-10-CM | POA: Diagnosis present

## 2022-08-22 DIAGNOSIS — J948 Other specified pleural conditions: Secondary | ICD-10-CM | POA: Diagnosis present

## 2022-08-22 DIAGNOSIS — E43 Unspecified severe protein-calorie malnutrition: Secondary | ICD-10-CM | POA: Diagnosis present

## 2022-08-22 DIAGNOSIS — Z515 Encounter for palliative care: Secondary | ICD-10-CM | POA: Diagnosis not present

## 2022-08-22 DIAGNOSIS — J9383 Other pneumothorax: Secondary | ICD-10-CM | POA: Diagnosis present

## 2022-08-22 DIAGNOSIS — C7951 Secondary malignant neoplasm of bone: Secondary | ICD-10-CM | POA: Diagnosis present

## 2022-08-22 DIAGNOSIS — F411 Generalized anxiety disorder: Secondary | ICD-10-CM | POA: Diagnosis not present

## 2022-08-22 LAB — CBC
HCT: 36.9 % (ref 36.0–46.0)
Hemoglobin: 12.4 g/dL (ref 12.0–15.0)
MCH: 31.3 pg (ref 26.0–34.0)
MCHC: 33.6 g/dL (ref 30.0–36.0)
MCV: 93.2 fL (ref 80.0–100.0)
Platelets: 403 10*3/uL — ABNORMAL HIGH (ref 150–400)
RBC: 3.96 MIL/uL (ref 3.87–5.11)
RDW: 12.3 % (ref 11.5–15.5)
WBC: 20 10*3/uL — ABNORMAL HIGH (ref 4.0–10.5)
nRBC: 0 % (ref 0.0–0.2)

## 2022-08-22 LAB — BODY FLUID CELL COUNT WITH DIFFERENTIAL
Eos, Fluid: 0 %
Lymphs, Fluid: 18 %
Monocyte-Macrophage-Serous Fluid: 75 % (ref 50–90)
Neutrophil Count, Fluid: 7 % (ref 0–25)
Total Nucleated Cell Count, Fluid: 2622 cu mm — ABNORMAL HIGH (ref 0–1000)

## 2022-08-22 LAB — COMPREHENSIVE METABOLIC PANEL
ALT: 44 U/L (ref 0–44)
AST: 28 U/L (ref 15–41)
Albumin: 3.6 g/dL (ref 3.5–5.0)
Alkaline Phosphatase: 127 U/L — ABNORMAL HIGH (ref 38–126)
Anion gap: 7 (ref 5–15)
BUN: 13 mg/dL (ref 6–20)
CO2: 21 mmol/L — ABNORMAL LOW (ref 22–32)
Calcium: 7.8 mg/dL — ABNORMAL LOW (ref 8.9–10.3)
Chloride: 107 mmol/L (ref 98–111)
Creatinine, Ser: 0.53 mg/dL (ref 0.44–1.00)
GFR, Estimated: >60 mL/min (ref >60)
Glucose, Bld: 105 mg/dL — ABNORMAL HIGH (ref 70–99)
Potassium: 4 mmol/L (ref 3.5–5.1)
Sodium: 135 mmol/L (ref 135–145)
Total Bilirubin: 0.7 mg/dL (ref 0.3–1.2)
Total Protein: 6.6 g/dL (ref 6.5–8.1)

## 2022-08-22 LAB — GRAM STAIN

## 2022-08-22 LAB — PROTEIN, PLEURAL OR PERITONEAL FLUID: Total protein, fluid: 4.1 g/dL

## 2022-08-22 LAB — LACTATE DEHYDROGENASE, PLEURAL OR PERITONEAL FLUID: LD, Fluid: 436 U/L — ABNORMAL HIGH (ref 3–23)

## 2022-08-22 MED ORDER — SERTRALINE HCL 100 MG PO TABS
100.0000 mg | ORAL_TABLET | Freq: Two times a day (BID) | ORAL | Status: DC
  Administered 2022-08-22 – 2022-09-07 (×31): 100 mg via ORAL
  Filled 2022-08-22 (×31): qty 1

## 2022-08-22 MED ORDER — DICLOFENAC SODIUM 1 % EX GEL
4.0000 g | Freq: Four times a day (QID) | CUTANEOUS | Status: DC
  Administered 2022-08-23 – 2022-09-06 (×28): 4 g via TOPICAL
  Filled 2022-08-22: qty 100

## 2022-08-22 MED ORDER — ONDANSETRON HCL 4 MG/2ML IJ SOLN
4.0000 mg | Freq: Four times a day (QID) | INTRAMUSCULAR | Status: DC | PRN
  Administered 2022-08-22: 4 mg via INTRAVENOUS
  Filled 2022-08-22: qty 2

## 2022-08-22 MED ORDER — SODIUM CHLORIDE 0.9 % IV SOLN
12.5000 mg | Freq: Four times a day (QID) | INTRAVENOUS | Status: DC | PRN
  Administered 2022-08-22: 12.5 mg via INTRAVENOUS
  Filled 2022-08-22: qty 12.5
  Filled 2022-08-22: qty 0.5

## 2022-08-22 MED ORDER — VITAMIN D (ERGOCALCIFEROL) 1.25 MG (50000 UNIT) PO CAPS
50000.0000 [IU] | ORAL_CAPSULE | ORAL | Status: DC
  Administered 2022-08-23 – 2022-08-30 (×2): 50000 [IU] via ORAL
  Filled 2022-08-22 (×2): qty 1

## 2022-08-22 MED ORDER — CLOPIDOGREL BISULFATE 75 MG PO TABS
75.0000 mg | ORAL_TABLET | Freq: Every day | ORAL | Status: DC
  Administered 2022-08-23 – 2022-09-07 (×16): 75 mg via ORAL
  Filled 2022-08-22 (×16): qty 1

## 2022-08-22 MED ORDER — LIDOCAINE HCL 1 % IJ SOLN
INTRAMUSCULAR | Status: AC
  Filled 2022-08-22: qty 10

## 2022-08-22 MED ORDER — ROSUVASTATIN CALCIUM 10 MG PO TABS
10.0000 mg | ORAL_TABLET | Freq: Every day | ORAL | Status: DC
  Administered 2022-08-23 – 2022-08-31 (×7): 10 mg via ORAL
  Filled 2022-08-22 (×8): qty 1

## 2022-08-22 MED ORDER — ONDANSETRON HCL 4 MG/2ML IJ SOLN
4.0000 mg | Freq: Four times a day (QID) | INTRAMUSCULAR | Status: DC
  Administered 2022-08-22 – 2022-08-23 (×5): 4 mg via INTRAVENOUS
  Filled 2022-08-22 (×5): qty 2

## 2022-08-22 MED ORDER — HYDROCODONE-ACETAMINOPHEN 5-325 MG PO TABS
1.0000 | ORAL_TABLET | Freq: Four times a day (QID) | ORAL | Status: DC | PRN
  Administered 2022-08-23: 1 via ORAL
  Administered 2022-08-25 (×2): 2 via ORAL
  Filled 2022-08-22: qty 1
  Filled 2022-08-22 (×2): qty 2

## 2022-08-22 MED ORDER — LIDOCAINE VISCOUS HCL 2 % MT SOLN: 15.0000 mL | OROMUCOSAL | Status: DC | PRN

## 2022-08-22 MED ORDER — LORAZEPAM 2 MG/ML IJ SOLN
0.5000 mg | Freq: Four times a day (QID) | INTRAMUSCULAR | Status: DC | PRN
  Administered 2022-08-22 (×2): 0.5 mg via INTRAVENOUS
  Filled 2022-08-22 (×2): qty 1

## 2022-08-22 MED ORDER — CHLORHEXIDINE GLUCONATE CLOTH 2 % EX PADS
6.0000 | MEDICATED_PAD | Freq: Every day | CUTANEOUS | Status: DC
  Administered 2022-08-23 – 2022-09-06 (×13): 6 via TOPICAL

## 2022-08-22 MED ORDER — POTASSIUM CHLORIDE 10 MEQ/100ML IV SOLN
10.0000 meq | Freq: Once | INTRAVENOUS | Status: AC
  Administered 2022-08-22: 10 meq via INTRAVENOUS
  Filled 2022-08-22: qty 100

## 2022-08-22 MED ORDER — ALBUTEROL SULFATE HFA 108 (90 BASE) MCG/ACT IN AERS: 2.0000 | INHALATION_SPRAY | Freq: Four times a day (QID) | RESPIRATORY_TRACT | Status: DC | PRN

## 2022-08-22 MED ORDER — CLONAZEPAM 1 MG PO TABS
1.0000 mg | ORAL_TABLET | Freq: Two times a day (BID) | ORAL | Status: DC
  Administered 2022-08-22 – 2022-08-30 (×17): 1 mg via ORAL
  Filled 2022-08-22 (×18): qty 1

## 2022-08-22 MED ORDER — FOLIC ACID 1 MG PO TABS
1.0000 mg | ORAL_TABLET | Freq: Every day | ORAL | Status: DC
  Administered 2022-08-23 – 2022-09-07 (×15): 1 mg via ORAL
  Filled 2022-08-22 (×16): qty 1

## 2022-08-22 MED ORDER — ACETAMINOPHEN 325 MG PO TABS
650.0000 mg | ORAL_TABLET | Freq: Four times a day (QID) | ORAL | Status: DC | PRN
  Administered 2022-08-30 – 2022-09-06 (×3): 650 mg via ORAL
  Filled 2022-08-22 (×4): qty 2

## 2022-08-22 MED ORDER — DEXAMETHASONE SODIUM PHOSPHATE 10 MG/ML IJ SOLN
8.0000 mg | Freq: Every day | INTRAMUSCULAR | Status: DC
  Administered 2022-08-22 – 2022-08-30 (×9): 8 mg via INTRAVENOUS
  Filled 2022-08-22 (×10): qty 1

## 2022-08-22 MED ORDER — PROCHLORPERAZINE MALEATE 10 MG PO TABS: 10.0000 mg | ORAL_TABLET | Freq: Four times a day (QID) | ORAL | Status: DC | PRN

## 2022-08-22 MED ORDER — ONDANSETRON HCL 4 MG PO TABS: 4.0000 mg | ORAL_TABLET | Freq: Four times a day (QID) | ORAL | Status: DC | PRN

## 2022-08-22 MED ORDER — ACETAMINOPHEN 650 MG RE SUPP: 650.0000 mg | Freq: Four times a day (QID) | RECTAL | Status: DC | PRN

## 2022-08-22 MED ORDER — SODIUM CHLORIDE 0.9 % IV SOLN
25.0000 mg | Freq: Once | INTRAVENOUS | Status: DC
  Filled 2022-08-22: qty 1

## 2022-08-22 MED ORDER — ASPIRIN 81 MG PO TBEC
81.0000 mg | DELAYED_RELEASE_TABLET | Freq: Every day | ORAL | Status: DC
  Administered 2022-08-23 – 2022-09-07 (×15): 81 mg via ORAL
  Filled 2022-08-22 (×16): qty 1

## 2022-08-22 MED ORDER — ENOXAPARIN SODIUM 40 MG/0.4ML IJ SOSY
40.0000 mg | PREFILLED_SYRINGE | INTRAMUSCULAR | Status: DC
  Administered 2022-08-22 – 2022-08-23 (×2): 40 mg via SUBCUTANEOUS
  Filled 2022-08-22 (×2): qty 0.4

## 2022-08-22 MED ORDER — SODIUM CHLORIDE 0.9 % IV SOLN
12.5000 mg | Freq: Four times a day (QID) | INTRAVENOUS | Status: DC | PRN
  Administered 2022-08-22 (×2): 12.5 mg via INTRAVENOUS
  Filled 2022-08-22 (×2): qty 12.5

## 2022-08-22 MED ORDER — ALBUTEROL SULFATE (2.5 MG/3ML) 0.083% IN NEBU
2.5000 mg | INHALATION_SOLUTION | Freq: Four times a day (QID) | RESPIRATORY_TRACT | Status: DC | PRN
  Administered 2022-08-24: 2.5 mg via RESPIRATORY_TRACT
  Filled 2022-08-22: qty 3

## 2022-08-22 MED ORDER — TRAZODONE HCL 50 MG PO TABS
150.0000 mg | ORAL_TABLET | Freq: Every day | ORAL | Status: DC
  Administered 2022-08-22 – 2022-08-30 (×9): 150 mg via ORAL
  Filled 2022-08-22 (×9): qty 3

## 2022-08-22 MED ORDER — LACTATED RINGERS IV SOLN: INTRAVENOUS | Status: DC

## 2022-08-22 MED ORDER — POTASSIUM CHLORIDE 2 MEQ/ML IV SOLN
INTRAVENOUS | Status: DC
  Filled 2022-08-22 (×15): qty 1000

## 2022-08-22 NOTE — Assessment & Plan Note (Addendum)
Acute from chemotherapy superimposed on chronic in setting of cancer. Zofran PRN Phenergan PRN refractory N/V (giving this now due to ongoing symptoms while I am in room). Monitor reviewed: no QT prolongation at this time. NPO IVF Replace K

## 2022-08-22 NOTE — Procedures (Addendum)
PROCEDURE SUMMARY:  Successful US guided right thoracentesis. Yielded 420 mL of clear orange fluid. Patient tolerated procedure well. No immediate complications. EBL = trace  Specimen was sent for labs.  Post procedure chest X-ray reveals small apical pneumothorax, likely incomplete re-expansion due to cancer.  Patient is asymptomatic  Will repeat CXR tomorrow am ensure stability.  Saima Monterroso S Robb Sibal PA-C 08/22/2022 9:43 AM

## 2022-08-22 NOTE — Progress Notes (Signed)
Patient was seen and examined.  Admitted early morning hours by nighttime hospitalist.  In brief, 57 year old female with history of non-small cell lung cancer on chemotherapy, chronic back pain and hypertension who underwent first round of induction chemotherapy with carboplatin and Alimta on 2/28 and since then having intractable nausea vomiting chest pain and shortness of breath.  Unable to tolerate oral medications and diet.  Seen and examined.  Persistently nauseated.  Intractable nausea vomiting secondary to chemotherapy, unable to take by mouth: Continue maintenance IV fluids with potassium.  Recheck electrolytes tomorrow morning. Zofran 4 mg IV every 6 hours scheduled, Phenergan 12.5 mg for refractory nausea vomiting. Will challenge with clears today. Dexamethasone 8 mg IV daily, first dose now to help with chemotherapy-induced nausea. Adequate pain medications. Right-sided thoracentesis, 400 mL removed labs are pending.  Likely malignant pleural effusion.  Metastatic non-small cell lung cancer on chemotherapy: Followed by oncology.  Consulted to follow-up in the hospital.  Palliative care also consulted.   Total time spent: 35 minutes.  Same-day admit.  No charge visit.

## 2022-08-22 NOTE — Progress Notes (Signed)
Pt arrived to unit from ED alert and oriented x4, primality spanish speaking but understands basic English. Pt oriented to surroundings, POC reviewed to pt's apparent understanding. Pt c/o N/V, medicated per MD orders, medicated for pain, pt placed on telemetry monitor. Pt refused Po medications due to vomiting. Skin assessment completed with Charge nurse. IVF infusing, pt denies any needs at this time, call light within reach.

## 2022-08-22 NOTE — Assessment & Plan Note (Signed)
1st round of induction chemo on 2/28 Pal care consult in AM Onc consult also put into epic

## 2022-08-22 NOTE — Assessment & Plan Note (Signed)
Pt with development of R sided pleural effusion over past 1 month.  Likely malignant effusion in setting of worsening adenocarcinoma of lung. Had first round of induction chemo on 2/28 Given SOB: will put in for IR evaluation for drainage of effusion.
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
## 2022-08-23 ENCOUNTER — Inpatient Hospital Stay (HOSPITAL_COMMUNITY): Payer: Commercial Managed Care - HMO

## 2022-08-23 ENCOUNTER — Encounter (HOSPITAL_COMMUNITY): Payer: Self-pay

## 2022-08-23 DIAGNOSIS — R112 Nausea with vomiting, unspecified: Secondary | ICD-10-CM | POA: Diagnosis not present

## 2022-08-23 DIAGNOSIS — Z515 Encounter for palliative care: Secondary | ICD-10-CM

## 2022-08-23 DIAGNOSIS — Z7189 Other specified counseling: Secondary | ICD-10-CM

## 2022-08-23 DIAGNOSIS — F339 Major depressive disorder, recurrent, unspecified: Secondary | ICD-10-CM

## 2022-08-23 LAB — CBC WITH DIFFERENTIAL/PLATELET
Abs Immature Granulocytes: 0.11 10*3/uL — ABNORMAL HIGH (ref 0.00–0.07)
Basophils Absolute: 0 10*3/uL (ref 0.0–0.1)
Basophils Relative: 0 %
Eosinophils Absolute: 0.5 10*3/uL (ref 0.0–0.5)
Eosinophils Relative: 4 %
HCT: 35.6 % — ABNORMAL LOW (ref 36.0–46.0)
Hemoglobin: 11.8 g/dL — ABNORMAL LOW (ref 12.0–15.0)
Immature Granulocytes: 1 %
Lymphocytes Relative: 7 %
Lymphs Abs: 1 10*3/uL (ref 0.7–4.0)
MCH: 30.6 pg (ref 26.0–34.0)
MCHC: 33.1 g/dL (ref 30.0–36.0)
MCV: 92.5 fL (ref 80.0–100.0)
Monocytes Absolute: 0.3 10*3/uL (ref 0.1–1.0)
Monocytes Relative: 2 %
Neutro Abs: 12.4 10*3/uL — ABNORMAL HIGH (ref 1.7–7.7)
Neutrophils Relative %: 86 %
Platelets: 364 10*3/uL (ref 150–400)
RBC: 3.85 MIL/uL — ABNORMAL LOW (ref 3.87–5.11)
RDW: 12.2 % (ref 11.5–15.5)
WBC: 14.3 10*3/uL — ABNORMAL HIGH (ref 4.0–10.5)
nRBC: 0 % (ref 0.0–0.2)

## 2022-08-23 LAB — COMPREHENSIVE METABOLIC PANEL
ALT: 34 U/L (ref 0–44)
AST: 19 U/L (ref 15–41)
Albumin: 3.4 g/dL — ABNORMAL LOW (ref 3.5–5.0)
Alkaline Phosphatase: 109 U/L (ref 38–126)
Anion gap: 6 (ref 5–15)
BUN: 10 mg/dL (ref 6–20)
CO2: 21 mmol/L — ABNORMAL LOW (ref 22–32)
Calcium: 7.3 mg/dL — ABNORMAL LOW (ref 8.9–10.3)
Chloride: 105 mmol/L (ref 98–111)
Creatinine, Ser: 0.52 mg/dL (ref 0.44–1.00)
GFR, Estimated: >60 mL/min (ref >60)
Glucose, Bld: 105 mg/dL — ABNORMAL HIGH (ref 70–99)
Potassium: 3.7 mmol/L (ref 3.5–5.1)
Sodium: 132 mmol/L — ABNORMAL LOW (ref 135–145)
Total Bilirubin: 0.6 mg/dL (ref 0.3–1.2)
Total Protein: 6.3 g/dL — ABNORMAL LOW (ref 6.5–8.1)

## 2022-08-23 LAB — MAGNESIUM: Magnesium: 2.5 mg/dL — ABNORMAL HIGH (ref 1.7–2.4)

## 2022-08-23 LAB — PHOSPHORUS: Phosphorus: 1.5 mg/dL — ABNORMAL LOW (ref 2.5–4.6)

## 2022-08-23 MED ORDER — OLANZAPINE 5 MG PO TABS
10.0000 mg | ORAL_TABLET | Freq: Every day | ORAL | Status: DC
  Administered 2022-08-24 – 2022-08-25 (×2): 10 mg via ORAL
  Filled 2022-08-23 (×3): qty 2

## 2022-08-23 MED ORDER — FENTANYL CITRATE PF 50 MCG/ML IJ SOSY
12.5000 ug | PREFILLED_SYRINGE | INTRAMUSCULAR | Status: DC | PRN
  Administered 2022-08-23 – 2022-08-27 (×9): 12.5 ug via INTRAVENOUS
  Filled 2022-08-23 (×9): qty 1

## 2022-08-23 MED ORDER — LORAZEPAM 2 MG/ML IJ SOLN
1.0000 mg | Freq: Four times a day (QID) | INTRAMUSCULAR | Status: DC | PRN
  Administered 2022-08-24: 1 mg via INTRAVENOUS
  Filled 2022-08-23: qty 1

## 2022-08-23 MED ORDER — BOOST / RESOURCE BREEZE PO LIQD CUSTOM
1.0000 | Freq: Three times a day (TID) | ORAL | Status: DC
  Administered 2022-08-25 – 2022-09-06 (×7): 1 via ORAL

## 2022-08-23 MED ORDER — FAMOTIDINE IN NACL 20-0.9 MG/50ML-% IV SOLN
20.0000 mg | Freq: Two times a day (BID) | INTRAVENOUS | Status: AC
  Administered 2022-08-23 – 2022-08-25 (×6): 20 mg via INTRAVENOUS
  Filled 2022-08-23 (×6): qty 50

## 2022-08-23 MED ORDER — SENNA 8.6 MG PO TABS
1.0000 | ORAL_TABLET | Freq: Every day | ORAL | Status: DC
  Administered 2022-08-23 – 2022-09-06 (×10): 8.6 mg via ORAL
  Filled 2022-08-23 (×13): qty 1

## 2022-08-23 MED ORDER — LORAZEPAM 2 MG/ML IJ SOLN
1.0000 mg | INTRAMUSCULAR | Status: AC
  Administered 2022-08-23: 1 mg via INTRAVENOUS
  Filled 2022-08-23: qty 1

## 2022-08-23 MED ORDER — SODIUM CHLORIDE 0.9 % IV SOLN
8.0000 mg | Freq: Four times a day (QID) | INTRAVENOUS | Status: AC
  Administered 2022-08-23 – 2022-08-24 (×3): 8 mg via INTRAVENOUS
  Filled 2022-08-23 (×3): qty 4

## 2022-08-23 MED ORDER — ADULT MULTIVITAMIN W/MINERALS CH
1.0000 | ORAL_TABLET | Freq: Every day | ORAL | Status: DC
  Administered 2022-08-24 – 2022-08-30 (×5): 1 via ORAL
  Filled 2022-08-23 (×8): qty 1

## 2022-08-23 MED ORDER — PROCHLORPERAZINE EDISYLATE 10 MG/2ML IJ SOLN
10.0000 mg | INTRAMUSCULAR | Status: DC | PRN
  Administered 2022-08-23 – 2022-09-02 (×11): 10 mg via INTRAVENOUS
  Filled 2022-08-23 (×11): qty 2

## 2022-08-23 NOTE — Progress Notes (Signed)
Initial Nutrition Assessment  DOCUMENTATION CODES:   Underweight  INTERVENTION:   -Boost Breeze po TID, each supplement provides 250 kcal and 9 grams of protein  -Multivitamin with minerals daily  NUTRITION DIAGNOSIS:   Increased nutrient needs related to cancer and cancer related treatments as evidenced by estimated needs.  GOAL:   Patient will meet greater than or equal to 90% of their needs  MONITOR:   PO intake, Supplement acceptance, Labs, Weight trends, I & O's  REASON FOR ASSESSMENT:   Malnutrition Screening Tool    ASSESSMENT:   57 y.o. 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 CT scan result indicated effusion concerning for malignant effusion- s/p thoracentesis with 459m off, pathology pending. Scan also indicated progression of cancer since last scan 07/05/22 and concerns for lymphangitic carcinomatosis.  3/3: s/p right sided thoracentesis, yield 400 ml  Patient actively vomiting when RD entered patient's room. When asked if she needs anything, she just shook her head. Will attempt to speak with patient at a later time. Palliative care has seen patient for symptom management.  Per chart review, pt has had persistent nausea and vomiting since chemotherapy on 2/28. Pt undergoing treatment for metastatic lung cancer to the bone. Pt cancelled nutrition appointment with CDaisyon 2/20.   Pt currently on full liquids, will order Boost Breeze and daily MVI once symptoms more controlled.  Per weight records, pt has lost 15 lbs since 1/17 (13% wt loss x 1.5 months, significant for time frame).  Medications: Folic acid, Senokot, Vitamin D,  Zofran  Lab reviewed:  Low Na Low Phos Elevated Mg  NUTRITION - FOCUSED PHYSICAL EXAM:  Unable to complete, pt actively vomiting.  Diet Order:   Diet Order             Diet full liquid Room service appropriate? Yes; Fluid consistency: Thin  Diet effective now                   EDUCATION NEEDS:   Not appropriate for education at this time  Skin:  Skin Assessment: Reviewed RN Assessment  Last BM:  2/25  Height:   Ht Readings from Last 1 Encounters:  08/21/22 '5\' 2"'$  (1.575 m)    Weight:   Wt Readings from Last 1 Encounters:  08/22/22 45.8 kg    BMI:  Body mass index is 18.47 kg/m.  Estimated Nutritional Needs:   Kcal:  1650-1850  Protein:  75-95g  Fluid:  >1.9L/day  LClayton Bibles MS, RD, LDN Inpatient Clinical Dietitian Contact information available via Amion

## 2022-08-23 NOTE — Consult Note (Signed)
Consultation Note Date: 08/23/2022   Patient Name: Karen [name]  DOB: [birth year]  MRN: [id number]  Age / Sex: 57 y.o., female  PCP: Starlyn Skeans, MD Referring Physician: Barb Merino, MD  Reason for Consultation:  Pt with adenocarcinoma of lung, just had first round of induction chemo x5 days ago, and didnt go very well. now in with SOB + worsening N/V. has worsening cancer on CT and malignant effusion.   HPI/Patient Profile: 57 y.o. 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 CT scan result indicated effusion concerning for malignant effusion- s/p thoracentesis with 457m off, pathology pending. Scan also indicated progression of cancer since last scan 07/05/22 and concerns for lymphangitic carcinomatosis. Palliative medicine consulted for symptom management.   Primary Decision Maker PATIENT  Discussion: Chart reviewed including labs, progress notes, imaging from this and previous encounters.  I met with patient using interpreter via [employer]  She complains of constant nausea that is unrelieved with current interventions. Has pain in her back, hydrocodone/apap relieves "a little". No aggravating factors. She was having nausea and decreased appetite prior to her chemotherapy, but it became significantly worse after her chemotherapy. She is also unable to eat.  She has anxiety and is taking clonipin twice daily. She is sad, but doesn't wish to talk. She is withdrawn.     SUMMARY OF RECOMMENDATIONS -Refractory CINV- -Increase ondansetron to '8mg'$  q6 hours -Start olanzipine '10mg'$  daily x  3 days -Lorazepam IV '1mg'$  now, and '1mg'$  IV q6hrs prn refractory nausea or anxiety -Famotidine '20mg'$  IV q12 hrs x 3 days -Continue dexamethasone as ordered -Compazine '10mg'$  IV every 4 hours prn nausea refractory to ondansetron -PMT will continue to follow -Noted LBM documented as 2/25- have asked RN to confirm- does not think this is accurate- will start senna 1 po QHS for opioid constipation prophylaxis    Code Status/Advance Care Planning: Full code   Prognosis:   Unable to determine  Discharge Planning: To Be Determined  Primary Diagnoses: Present on Admission:  Chronic pain syndrome  Cancer associated pain  Pleural effusion on right  Intractable nausea and vomiting  [place]   Review of Systems  Constitutional:  Positive for appetite change.  Gastrointestinal:  Negative for abdominal pain and constipation.    Physical Exam Vitals and nursing note reviewed.  Constitutional:      General: She is not in acute distress.    Appearance: She is not ill-appearing.  Pulmonary:     Effort: Pulmonary effort is normal.  Neurological:     Mental Status: She is alert.  Psychiatric:     Comments: Flat affect, withdrawn     Vital Signs: BP 130/82 (BP Location: Right Arm)   Pulse (!) 104  Temp 98 F (36.7 C) (Oral)   Resp 18   Ht '5\' 2"'$  (1.575 m)   Wt 45.8 kg   LMP 02/08/2006   SpO2 97%   BMI 18.47 kg/m  Pain Scale: 0-10   Pain Score: 8    SpO2: SpO2: 97 % O2 Device:SpO2: 97 % O2 Flow Rate: .   IO: Intake/output summary:  Intake/Output Summary (Last 24 hours) at 08/23/2022 1155 Last data filed at 08/23/2022 0515 Gross per 24 hour  Intake 1113.53 ml  Output --  Net 1113.53 ml   Baseline Weight: Weight: 46.3 kg Most recent weight: Weight: 45.8 kg       Thank you for this consult. Palliative medicine will continue to follow and assist as needed.  Time Total: 90 minutes Greater than 50%  of this time was spent counseling and  coordinating care related to the above assessment and plan.  Signed by: Mariana Kaufman, AGNP-C Palliative Medicine    Please contact Palliative Medicine Team phone at [phone] for questions and concerns.  For individual provider: See Shea Evans

## 2022-08-23 NOTE — Progress Notes (Signed)
PROGRESS NOTE    Karen [name]  [id number] DOB: [birth year] DOA: 08/21/2022 PCP: Starlyn Skeans, MD    Brief Narrative:  57 year old female with history of non-small cell lung cancer on chemotherapy, chronic back pain and hypertension who underwent first round of induction chemotherapy with carboplatin and Alimta on 2/28 and since then having intractable nausea vomiting chest pain and shortness of breath. Unable to tolerate oral medications and diet.    Assessment & Plan:   Intractable nausea vomiting secondary to chemotherapy, no oral intake: Continue maintenance IV fluids with potassium.  Electrolytes are adequate today. Zofran 4 mg IV every 6 hours scheduled, Phenergan 12.5 mg for refractory nausea vomiting. Advance to full liquid diet, patient to choose any comfortable liquid she can take. Dexamethasone 8 mg IV daily.  Some clinical response today. Right-sided thoracentesis, 400 mL removed. labs are pending.  Likely malignant pleural effusion. Oncology to add in the recommendations. Continue Zoloft 100 mg twice daily. Trazodone at bedtime. Clonazepam, if unable to take by mouth, will use some Ativan for anxiety.   Metastatic non-small cell lung cancer on chemotherapy: Followed by oncology.  Consulted to follow-up in the hospital.  Palliative care also consulted for symptom management.   DVT prophylaxis: enoxaparin (LOVENOX) injection 40 mg Start: 08/22/22 1400   Code Status: Full code Family Communication: None at the bedside Disposition Plan: Status is: Inpatient Remains inpatient appropriate because: Persistent nausea vomiting     Consultants:  Oncology Palliative care  Procedures:  None  Antimicrobials:  None   Subjective: Patient seen and examined.  Flat affect.  Looks very anxious.  Today not actively vomiting but does not feel like she can try to put anything in her mouth.  Pain is persistent in her anterior chest and epigastric region, worse with  movement.  Objective: Vitals:   08/22/22 1003 08/22/22 1300 08/22/22 2057 08/23/22 0513  BP: 136/84 (!) 134/54 121/79 130/82  Pulse: (!) 109 62 (!) 105 (!) 104  Resp: '18  16 18  '$ Temp: 98.5 F (36.9 C) 98.5 F (36.9 C) 98.1 F (36.7 C) 98 F (36.7 C)  TempSrc: Oral Oral Oral Oral  SpO2: 96% 99% 98% 97%  Weight:      Height:        Intake/Output Summary (Last 24 hours) at 08/23/2022 1047 Last data filed at 08/23/2022 0515 Gross per 24 hour  Intake 1113.53 ml  Output --  Net 1113.53 ml   Filed Weights   08/21/22 1919 08/22/22 0146  Weight: 46.3 kg 45.8 kg    Examination:  General exam: Sick looking.  Frail and debilitated. Respiratory system: Clear to auscultation.  No added sounds.  Port-A-Cath present right chest wall. Cardiovascular system: S1 & S2 heard, RRR.  Gastrointestinal system: Soft.  Nondistended.  Mild epigastric tenderness mostly on the superficial skin palpation.   Central nervous system: Alert and oriented. No focal neurological deficits. Psychiatry: Judgement and insight appear normal.  Anxious with flat affect.    Data Reviewed: I have personally reviewed following labs and imaging studies  CBC: Recent Labs  Lab 08/18/22 1152 08/21/22 1930 08/22/22 0544 08/23/22 0515  WBC 12.6* 21.1* 20.0* 14.3*  NEUTROABS 9.1*  --   --  12.4*  HGB 12.7 13.5 12.4 11.8*  HCT 36.6 39.6 36.9 35.6*  MCV 90.4 91.0 93.2 92.5  PLT 455* 469* 403* [id number]   Basic Metabolic Panel: Recent Labs  Lab 08/18/22 1152 08/21/22 1930 08/21/22 1958 08/22/22 0544 08/23/22 0515  NA 137 133*  --  135 132*  K 3.5 3.3*  --  4.0 3.7  CL 104 105  --  107 105  CO2 26 18*  --  21* 21*  GLUCOSE 122* 124*  --  105* 105*  BUN 15 16  --  13 10  CREATININE 0.54 0.54  --  0.53 0.52  CALCIUM 8.5* 8.0*  --  7.8* 7.3*  MG  --   --  2.4  --  2.5*  PHOS  --   --   --   --  1.5*   GFR: Estimated Creatinine Clearance: 56.8 mL/min (by C-G formula based on SCr of 0.52 mg/dL). Liver Function  Tests: Recent Labs  Lab 08/18/22 1152 08/21/22 1930 08/22/22 0544 08/23/22 0515  AST 16 34 28 19  ALT 12 54* 44 34  ALKPHOS 109 129* 127* 109  BILITOT 0.3 0.7 0.7 0.6  PROT 6.2* 7.5 6.6 6.3*  ALBUMIN 3.8 4.0 3.6 3.4*   Recent Labs  Lab 08/21/22 1930  LIPASE 46   No results for input(s): "AMMONIA" in the last 168 hours. Coagulation Profile: No results for input(s): "INR", "PROTIME" in the last 168 hours. Cardiac Enzymes: No results for input(s): "CKTOTAL", "CKMB", "CKMBINDEX", "TROPONINI" in the last 168 hours. BNP (last 3 results) No results for input(s): "PROBNP" in the last 8760 hours. HbA1C: No results for input(s): "HGBA1C" in the last 72 hours. CBG: No results for input(s): "GLUCAP" in the last 168 hours. Lipid Profile: No results for input(s): "CHOL", "HDL", "LDLCALC", "TRIG", "CHOLHDL", "LDLDIRECT" in the last 72 hours. Thyroid Function Tests: No results for input(s): "TSH", "T4TOTAL", "FREET4", "T3FREE", "THYROIDAB" in the last 72 hours. Anemia Panel: No results for input(s): "VITAMINB12", "FOLATE", "FERRITIN", "TIBC", "IRON", "RETICCTPCT" in the last 72 hours. Sepsis Labs: Recent Labs  Lab 08/21/22 1958  LATICACIDVEN 1.2    Recent Results (from the past 240 hour(s))  Culture, body fluid w Gram Stain-bottle     Status: None (Preliminary result)   Collection Time: 08/21/22  7:16 PM   Specimen: Fluid  Result Value Ref Range Status   Specimen Description FLUID  Final   Special Requests NONE  Final   Culture   Final    NO GROWTH < 24 HOURS Performed at [facility], [address] [place], [place] [address]    Report Status PENDING  Incomplete  Gram stain     Status: None   Collection Time: 08/21/22  7:16 PM   Specimen: Fluid  Result Value Ref Range Status   Specimen Description FLUID  Final   Special Requests NONE  Final   Gram Stain   Final    FEW WBC PRESENT, PREDOMINANTLY PMN NO ORGANISMS SEEN Performed at [facility], [place]  [address], [place], [place] [address]    Report Status 08/22/2022 FINAL  Final  Resp panel by RT-PCR (RSV, Flu A&B, Covid) Anterior Nasal Swab     Status: None   Collection Time: 08/21/22  7:41 PM   Specimen: Anterior Nasal Swab  Result Value Ref Range Status   SARS Coronavirus 2 by RT PCR NEGATIVE NEGATIVE Final    Comment: (NOTE) SARS-CoV-2 target nucleic acids are NOT DETECTED.  The SARS-CoV-2 RNA is generally detectable in upper respiratory specimens during the acute phase of infection. The lowest concentration of SARS-CoV-2 viral copies this assay can detect is 138 copies/mL. A negative result does not preclude SARS-Cov-2 infection and should not be used as the sole basis for treatment or other patient management  decisions. A negative result may occur with  improper specimen collection/handling, submission of specimen other than nasopharyngeal swab, presence of viral mutation(s) within the areas targeted by this assay, and inadequate number of viral copies(<138 copies/mL). A negative result must be combined with clinical observations, patient history, and epidemiological information. The expected result is Negative.  Fact Sheet for Patients:  EntrepreneurPulse.com.au  Fact Sheet for Healthcare Providers:  IncredibleEmployment.be  This test is no t yet approved or cleared by the [place] FDA and  has been authorized for detection and/or diagnosis of SARS-CoV-2 by FDA under an Emergency Use Authorization (EUA). This EUA will remain  in effect (meaning this test can be used) for the duration of the COVID-19 declaration under Section 564(b)(1) of the Act, 21 U.S.C.section 360bbb-3(b)(1), unless the authorization is terminated  or revoked sooner.       Influenza A by PCR NEGATIVE NEGATIVE Final   Influenza B by PCR NEGATIVE NEGATIVE Final    Comment: (NOTE) The Xpert Xpress SARS-CoV-2/FLU/RSV plus assay is intended as an aid in the diagnosis  of influenza from Nasopharyngeal swab specimens and should not be used as a sole basis for treatment. Nasal washings and aspirates are unacceptable for Xpert Xpress SARS-CoV-2/FLU/RSV testing.  Fact Sheet for Patients: EntrepreneurPulse.com.au  Fact Sheet for Healthcare Providers: IncredibleEmployment.be  This test is not yet approved or cleared by the [place] FDA and has been authorized for detection and/or diagnosis of SARS-CoV-2 by FDA under an Emergency Use Authorization (EUA). This EUA will remain in effect (meaning this test can be used) for the duration of the COVID-19 declaration under Section 564(b)(1) of the Act, 21 U.S.C. section 360bbb-3(b)(1), unless the authorization is terminated or revoked.     Resp Syncytial Virus by PCR NEGATIVE NEGATIVE Final    Comment: (NOTE) Fact Sheet for Patients: EntrepreneurPulse.com.au  Fact Sheet for Healthcare Providers: IncredibleEmployment.be  This test is not yet approved or cleared by the [place] FDA and has been authorized for detection and/or diagnosis of SARS-CoV-2 by FDA under an Emergency Use Authorization (EUA). This EUA will remain in effect (meaning this test can be used) for the duration of the COVID-19 declaration under Section 564(b)(1) of the Act, 21 U.S.C. section 360bbb-3(b)(1), unless the authorization is terminated or revoked.  Performed at [facility], [place] [address] [place], [place] [address]   Culture, blood (routine x 2)     Status: None (Preliminary result)   Collection Time: 08/21/22  7:58 PM   Specimen: BLOOD  Result Value Ref Range Status   Specimen Description   Final    BLOOD PORTA CATH Performed at [facility] [address] [place], [place] [address]    Special Requests   Final    BOTTLES DRAWN AEROBIC AND ANAEROBIC Blood Culture adequate volume Performed at [facility] [address], [place], [place] [address]    Culture   Final    NO GROWTH 2 DAYS Performed at [facility] [address] [place], [place] [address]    Report Status PENDING  Incomplete  Culture, blood (routine x 2)     Status: None (Preliminary result)   Collection Time: 08/21/22  7:58 PM   Specimen: BLOOD LEFT ARM  Result Value Ref Range Status   Specimen Description   Final    BLOOD LEFT ARM Performed at [facility], [place] [address], [place], [place] [address]    Special Requests   Final  BOTTLES DRAWN AEROBIC AND ANAEROBIC Blood Culture adequate volume Performed at [facility] [address], [place], [place] [address]    Culture   Final    NO GROWTH 2 DAYS Performed at [facility] [address], [place], [place] [address]    Report Status PENDING  Incomplete         Radiology Studies: US THORACENTESIS ASP PLEURAL SPACE W/IMG GUIDE  Result Date: 08/22/2022 INDICATION: Lung cancer with right pleural effusion. Request for diagnostic and therapeutic thoracentesis. EXAM: ULTRASOUND GUIDED RIGHT THORACENTESIS MEDICATIONS: 1% lidocaine 4 mL COMPLICATIONS: SIR Level A - No therapy, no consequence. PROCEDURE: An ultrasound guided thoracentesis was thoroughly discussed with the patient and questions answered. The benefits, risks, alternatives and complications were also discussed. The patient understands and wishes to proceed with the procedure. Written consent was obtained. Ultrasound was performed to localize and mark an adequate pocket of fluid in the right chest. The area was then prepped and draped in the normal sterile fashion. 1% Lidocaine was used for local anesthesia. Under ultrasound guidance a 6 Fr Safe-T-Centesis catheter was introduced. Thoracentesis was performed. The catheter was removed and a dressing applied. FINDINGS: A total of approximately 420 mL of clear amber fluid was removed. Samples were sent  to the laboratory as requested by the clinical team. IMPRESSION: Successful ultrasound guided right thoracentesis yielding 420 mL of pleural fluid. Post-procedure chest x-ray shows small apical pneumothorax most likely consistent with incomplete re-expansion due to known lung carcinoma. We will repeat chest x-ray tomorrow morning to ensure stability. Procedure performed by: Gareth Eagle, PA-C Electronically Signed   By: Aletta Edouard M.D.   On: 08/22/2022 11:06   DG Chest 1 View  Result Date: 08/22/2022 CLINICAL DATA:  Status post right thoracentesis. EXAM: CHEST  1 VIEW COMPARISON:  08/21/2022 FINDINGS: Interval decreased right pleural effusion. Small right apical pneumothorax evident. Right suprahilar and parahilar airspace disease again noted with patchy subtle parahilar opacities in the left lung. The visualized bony structures of the thorax are unremarkable. Telemetry leads overlie the chest. IMPRESSION: 1. Interval decreased right pleural effusion status post thoracentesis. Small right apical pneumothorax. 2. Right suprahilar and parahilar airspace disease with patchy subtle parahilar opacities in the left lung. These results will be called to the ordering clinician or representative by the Radiologist Assistant, and communication documented in the PACS or [employer]. Electronically Signed   By: Misty Stanley M.D.   On: 08/22/2022 10:28   CT Angio Chest PE W and/or Wo Contrast  Result Date: 08/21/2022 CLINICAL DATA:  Concern for pulmonary embolism. History hands cold post noted it cold EXAM: CT ANGIOGRAPHY CHEST WITH CONTRAST TECHNIQUE: Multidetector CT imaging of the chest was performed using the standard protocol during bolus administration of intravenous contrast. Multiplanar CT image reconstructions and MIPs were obtained to evaluate the vascular anatomy. RADIATION DOSE REDUCTION: This exam was performed according to the departmental dose-optimization program which includes automated exposure  control, adjustment of the mA and/or kV according to patient size and/or use of iterative reconstruction technique. CONTRAST:  14m OMNIPAQUE IOHEXOL 350 MG/ML SOLN COMPARISON:  Chest CT dated 06/25/2022 and radiograph dated 08/21/2022. FINDINGS: Evaluation of this exam is limited due to respiratory motion artifact. Cardiovascular: There is no cardiomegaly small pericardial effusion, new since the prior CT and measuring 1 cm in thickness. The thoracic aorta is unremarkable. The origins of the great vessels of the aortic arch appear patent. Evaluation of the pulmonary arteries is limited due to  respiratory motion. No central pulmonary artery embolus identified. Mediastinum/Nodes: There is infiltrative soft tissue the mediastinum and bilateral hila in keeping with known malignancy. No mediastinal fluid collection. Right-sided Port-A-Cath with tip at the cavoatrial junction. Lungs/Pleura: There is a moderate size right and trace left pleural effusions, new since the prior CT and suspicious for malignant effusion. There is partial compressive atelectasis of the right lower lobe. Patchy area of consolidation in the right perihilar region extending into the right upper lobe and right middle lobes may represent combination of tumor infiltration, atelectasis, or pneumonia. There is however diffuse interstitial thickening and nodularity primarily involving the upper lobes bilaterally as well as in the lower lobes suspicious for lymphangitic carcinomatosis. There is no pneumothorax. There is narrowing of the right upper lobe bronchus as well as narrowing of the right middle and right lower lobe bronchi. The central airways remain patent. Upper Abdomen: Small calcified splenic granuloma. Musculoskeletal: Sclerotic lesions involving T10, T12, and L3 consistent with metastatic disease. No acute osseous pathology. Review of the MIP images confirms the above findings. IMPRESSION: 1. No CT evidence of central pulmonary artery  embolus. 2. Moderate size right and trace left pleural effusions, new since the prior CT and suspicious for malignant effusion. 3. Infiltrative mediastinal and hilar mass in keeping with known malignancy. 4. Significant progression of disease with bilateral perihilar consolidation and findings 5. suspicious for lymphangitic carcinomatosis. 6. Sclerotic lesions involving T10, T12, and L3 consistent with metastatic disease. Electronically Signed   By: Anner Crete M.D.   On: 08/21/2022 20:53   DG Chest Port 1 View  Result Date: 08/21/2022 CLINICAL DATA:  Shortness of breath EXAM: PORTABLE CHEST 1 VIEW COMPARISON:  PET-CT dated July 29, 2022 and CT examination dated August 13, 2022 FINDINGS: The heart is normal in size. Pulmonary vascular congestion with bilateral perihilar opacities consistent with patient's known hilar lymphadenopathy. Small bilateral pleural effusions. Right IJ access MediPort with distal tip in the SVC no acute osseous abnormality IMPRESSION: 1. Pulmonary vascular congestion with bilateral perihilar opacities consistent with patient's known hilar lymphadenopathy. 2. Small bilateral pleural effusions. Electronically Signed   By: Keane Police D.O.   On: 08/21/2022 19:59        Scheduled Meds:  aspirin EC  81 mg Oral Daily   Chlorhexidine Gluconate Cloth  6 each Topical Daily   clonazePAM  1 mg Oral BID   clopidogrel  75 mg Oral Daily   dexamethasone (DECADRON) injection  8 mg Intravenous Daily   diclofenac Sodium  4 g Topical QID   enoxaparin (LOVENOX) injection  40 mg Subcutaneous [id number]   folic acid  1 mg Oral Daily   ondansetron (ZOFRAN) IV  4 mg Intravenous Q6H   rosuvastatin  10 mg Oral Daily   sertraline  100 mg Oral BID   traZODone  150 mg Oral QHS   Vitamin D (Ergocalciferol)  50,000 Units Oral Weekly   Continuous Infusions:  lactated ringers 1,000 mL with potassium chloride 40 mEq infusion 75 mL/hr at 08/22/22 1901   promethazine (PHENERGAN) injection (IM or  IVPB) 12.5 mg (08/22/22 2120)     LOS: 1 day    Time spent: 40 minutes    Barb Merino, MD [facility] Pager [phone]

## 2022-08-23 NOTE — TOC Progression Note (Signed)
Transition of Care [facility]) - Progression Note    Patient Details  Name: Macala Prokosch MRN: [id number] Date of Birth: 1966/01/01  Transition of Care [facility]) CM/SW [place], RN Phone [phone]  08/23/2022, 3:49 PM  Clinical Narrative:     Transition of Care [facility]) Screening Note   Patient Details  Name: Pranathi Doran Date of Birth: [birth year]   Transition of Care [facility]) CM/SW Contact:    Angelita Ingles, RN Phone Number: 08/23/2022, 3:50 PM    Transition of Care Department [facility]) has reviewed patient and no TOC needs have been identified at this time. We will continue to monitor patient advancement through interdisciplinary progression rounds. If new patient transition needs arise, please place a TOC consult.          Expected Discharge Plan and Services                                               Social Determinants of Health (SDOH) Interventions SDOH Screenings   Food Insecurity: No Food Insecurity (08/22/2022)  Housing: Low Risk  (08/22/2022)  Transportation Needs: No Transportation Needs (08/22/2022)  Recent Concern: Transportation Needs - Unmet Transportation Needs (07/09/2022)  Utilities: Not At Risk (08/22/2022)  Depression (PHQ2-9): High Risk (08/10/2022)  Social Connections: Socially Isolated (07/09/2022)  Tobacco Use: Low Risk  (08/21/2022)    Readmission Risk Interventions     No data to display

## 2022-08-23 NOTE — Plan of Care (Signed)
  Problem: Nutrition: Goal: Adequate nutrition will be maintained Outcome: Progressing   Problem: Education: Goal: Knowledge of disease or condition will improve Outcome: Progressing Goal: Knowledge of secondary prevention will improve (MUST DOCUMENT ALL) Outcome: Progressing Goal: Knowledge of patient specific risk factors will improve Karen [name] N/A or DELETE if not current risk factor) Outcome: Progressing

## 2022-08-24 ENCOUNTER — Inpatient Hospital Stay (HOSPITAL_COMMUNITY): Payer: Commercial Managed Care - HMO

## 2022-08-24 ENCOUNTER — Inpatient Hospital Stay: Payer: Commercial Managed Care - HMO

## 2022-08-24 ENCOUNTER — Other Ambulatory Visit: Payer: Self-pay

## 2022-08-24 DIAGNOSIS — J910 Malignant pleural effusion: Secondary | ICD-10-CM | POA: Diagnosis not present

## 2022-08-24 DIAGNOSIS — R112 Nausea with vomiting, unspecified: Secondary | ICD-10-CM | POA: Diagnosis not present

## 2022-08-24 DIAGNOSIS — Z515 Encounter for palliative care: Secondary | ICD-10-CM | POA: Diagnosis not present

## 2022-08-24 LAB — CYTOLOGY - NON PAP

## 2022-08-24 MED ORDER — FENTANYL CITRATE (PF) 100 MCG/2ML IJ SOLN
INTRAMUSCULAR | Status: AC
  Filled 2022-08-24: qty 4

## 2022-08-24 MED ORDER — NALOXONE HCL 0.4 MG/ML IJ SOLN
INTRAMUSCULAR | Status: AC
  Filled 2022-08-24: qty 1

## 2022-08-24 MED ORDER — ENOXAPARIN SODIUM 40 MG/0.4ML IJ SOSY
40.0000 mg | PREFILLED_SYRINGE | INTRAMUSCULAR | Status: DC
  Administered 2022-08-25 – 2022-09-07 (×14): 40 mg via SUBCUTANEOUS
  Filled 2022-08-24 (×14): qty 0.4

## 2022-08-24 MED ORDER — MIDAZOLAM HCL 2 MG/2ML IJ SOLN
INTRAMUSCULAR | Status: AC
  Filled 2022-08-24: qty 4

## 2022-08-24 MED ORDER — FLUMAZENIL 0.5 MG/5ML IV SOLN
INTRAVENOUS | Status: AC
  Filled 2022-08-24: qty 5

## 2022-08-24 MED ORDER — LIDOCAINE HCL (PF) 1 % IJ SOLN
INTRAMUSCULAR | Status: AC | PRN
  Administered 2022-08-24: 5 mL

## 2022-08-24 MED ORDER — LORAZEPAM 2 MG/ML IJ SOLN
1.0000 mg | INTRAMUSCULAR | Status: DC | PRN
  Administered 2022-08-26 – 2022-08-29 (×3): 1 mg via INTRAVENOUS
  Filled 2022-08-24 (×4): qty 1

## 2022-08-24 MED ORDER — MIDAZOLAM HCL 2 MG/2ML IJ SOLN
INTRAMUSCULAR | Status: AC | PRN
  Administered 2022-08-24 (×2): 1 mg via INTRAVENOUS

## 2022-08-24 MED ORDER — FENTANYL CITRATE (PF) 100 MCG/2ML IJ SOLN
INTRAMUSCULAR | Status: AC | PRN
  Administered 2022-08-24 (×2): 50 ug via INTRAVENOUS

## 2022-08-24 MED ORDER — POTASSIUM PHOSPHATES 15 MMOLE/5ML IV SOLN
30.0000 mmol | Freq: Once | INTRAVENOUS | Status: AC
  Administered 2022-08-24: 30 mmol via INTRAVENOUS
  Filled 2022-08-24: qty 10
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
## 2022-08-24 NOTE — Progress Notes (Signed)
MEDICATION-RELATED CONSULT NOTE   IR Procedure Consult - Anticoagulant/Antiplatelet PTA/Inpatient Med List Review by Pharmacist    Procedure: Guided right sided chest tube placement    Completed: 08/24/22 @ 16:03  Post-Procedural bleeding risk per IR MD assessment:  STANDARD  Antithrombotic medications on inpatient or PTA profile prior to procedure:    Enoxaparin '40mg'$  sq q24h    Recommended restart time per IR Post-Procedure Guidelines:   Resume Enoxaparin Day + 1 (next AM)   Plan:     08/25/22 @ 10am - resume Enoxaparin '40mg'$  sq q24h   Leone Haven, PharmD

## 2022-08-24 NOTE — Procedures (Signed)
Pre procedural Dx: Enlarging right sided PTX Post procedural Dx: Same  Technically successful CT guided placed of a 10 Fr chest tube into right sided hydropneumothorax yielding air and 250 cc of serous fluid. Chest tube connected to pleur-vac device.  EBL: Trace Complications: None immediate  Ronny Bacon, MD Pager #: [phone]

## 2022-08-24 NOTE — Progress Notes (Signed)
PROGRESS NOTE    Karen [name]  [id number] DOB: [birth year] DOA: 08/21/2022 PCP: Starlyn Skeans, MD    Brief Narrative:  57 year old female with history of non-small cell lung cancer on chemotherapy, chronic back pain and hypertension who underwent first round of induction chemotherapy with carboplatin and Alimta on 2/28 and since then having intractable nausea vomiting chest pain and shortness of breath. Unable to tolerate oral medications and diet.  Patient admitted due to cancer related pain, intractable nausea unrelieved at home.  She also noted to have right-sided pleural effusion, drained with a small pneumothorax. Continues to be symptomatic and found to have expanding pneumothorax.   Assessment & Plan:   Intractable nausea vomiting secondary to chemotherapy, no oral intake:  Continue maintenance IV fluids with potassium.  Electrolytes are adequate today. Zofran 4 mg IV every 6 hours scheduled, Phenergan 12.5 mg for refractory nausea vomiting. Not tolerating any diet, she is trying to take some sips of fluid.   Dexamethasone 8 mg IV daily.   Zyprexa added. Right-sided thoracentesis, 400 mL removed. labs are pending.  Likely malignant pleural effusion. Oncology to add in the recommendations. Continue Zoloft 100 mg twice daily. Trazodone at bedtime. Clonazepam, if unable to take by mouth, will use some Ativan for anxiety.  Right-sided hemopneumothorax with lung cancer postprocedure: Patient continues to have cough and shortness of breath.  Chest x-ray shows expanding pneumothorax with midline shift.  Patient hemodynamically stable.  She is on room air.  Discussed with IR, patient will have chest tube placed.  Hypophosphatemia: Replace and monitor.   Metastatic non-small cell lung cancer on chemotherapy: Followed by oncology.  Consulted to follow-up in the hospital.  Palliative care also consulted for symptom management. Have notified her oncologist of patient's  hospitalization.  Poor prognosis. Unsure patient can tolerate further chemotherapy.  She is likely candidate for hospice care, will let her oncologist to decide.   DVT prophylaxis: enoxaparin (LOVENOX) injection 40 mg Start: 08/22/22 1400   Code Status: Full code Family Communication: None at the bedside Disposition Plan: Status is: Inpatient Remains inpatient appropriate because: Persistent nausea vomiting, shortness of breath.     Consultants:  Oncology Palliative care IR.  Procedures:  None  Antimicrobials:  None   Subjective:  Patient seen in the morning rounds.  Remains very anxious and withdrawn.  She tells me she is about the same.  She states he is hurting.  She feels short of breath and has dry cough.  Patient tells me she is not able to eat anything. After evaluation by palliative care, a chest x-ray was ordered by palliative care team that showed expanding pneumothorax.  Called and discussed case with radiology to place chest tube.  Objective: Vitals:   08/23/22 2154 08/24/22 0606 08/24/22 1012 08/24/22 1329  BP: (!) 129/93 128/82  (!) 145/96  Pulse: (!) 102 (!) 110  (!) 107  Resp: '17 18  18  '$ Temp: 98.2 F (36.8 C) 98 F (36.7 C)  97.9 F (36.6 C)  TempSrc: Oral Oral  Oral  SpO2: 91% 90% (!) 84% 91%  Weight:      Height:        Intake/Output Summary (Last 24 hours) at 08/24/2022 1350 Last data filed at 08/24/2022 0311 Gross per 24 hour  Intake 1934.15 ml  Output --  Net 1934.15 ml   Filed Weights   08/21/22 1919 08/22/22 0146  Weight: 46.3 kg 45.8 kg    Examination:  General exam: Sick looking.  Frail and  debilitated.  Flat affect and withdrawn. Respiratory system: Poor inspiratory efforts. No added sounds.  Port-A-Cath present right chest wall. Cardiovascular system: S1 & S2 heard, RRR.  Gastrointestinal system: Soft.  Nondistended.  Mild epigastric tenderness mostly on the superficial skin palpation.   Central nervous system: Alert and  oriented. No focal neurological deficits. Psychiatry: Judgement and insight appear normal.  Anxious with flat affect.    Data Reviewed: I have personally reviewed following labs and imaging studies  CBC: Recent Labs  Lab 08/18/22 1152 08/21/22 1930 08/22/22 0544 08/23/22 0515  WBC 12.6* 21.1* 20.0* 14.3*  NEUTROABS 9.1*  --   --  12.4*  HGB 12.7 13.5 12.4 11.8*  HCT 36.6 39.6 36.9 35.6*  MCV 90.4 91.0 93.2 92.5  PLT 455* 469* 403* [id number]   Basic Metabolic Panel: Recent Labs  Lab 08/18/22 1152 08/21/22 1930 08/21/22 1958 08/22/22 0544 08/23/22 0515  NA 137 133*  --  135 132*  K 3.5 3.3*  --  4.0 3.7  CL 104 105  --  107 105  CO2 26 18*  --  21* 21*  GLUCOSE 122* 124*  --  105* 105*  BUN 15 16  --  13 10  CREATININE 0.54 0.54  --  0.53 0.52  CALCIUM 8.5* 8.0*  --  7.8* 7.3*  MG  --   --  2.4  --  2.5*  PHOS  --   --   --   --  1.5*   GFR: Estimated Creatinine Clearance: 56.8 mL/min (by C-G formula based on SCr of 0.52 mg/dL). Liver Function Tests: Recent Labs  Lab 08/18/22 1152 08/21/22 1930 08/22/22 0544 08/23/22 0515  AST 16 34 28 19  ALT 12 54* 44 34  ALKPHOS 109 129* 127* 109  BILITOT 0.3 0.7 0.7 0.6  PROT 6.2* 7.5 6.6 6.3*  ALBUMIN 3.8 4.0 3.6 3.4*   Recent Labs  Lab 08/21/22 1930  LIPASE 46   No results for input(s): "AMMONIA" in the last 168 hours. Coagulation Profile: No results for input(s): "INR", "PROTIME" in the last 168 hours. Cardiac Enzymes: No results for input(s): "CKTOTAL", "CKMB", "CKMBINDEX", "TROPONINI" in the last 168 hours. BNP (last 3 results) No results for input(s): "PROBNP" in the last 8760 hours. HbA1C: No results for input(s): "HGBA1C" in the last 72 hours. CBG: No results for input(s): "GLUCAP" in the last 168 hours. Lipid Profile: No results for input(s): "CHOL", "HDL", "LDLCALC", "TRIG", "CHOLHDL", "LDLDIRECT" in the last 72 hours. Thyroid Function Tests: No results for input(s): "TSH", "T4TOTAL", "FREET4",  "T3FREE", "THYROIDAB" in the last 72 hours. Anemia Panel: No results for input(s): "VITAMINB12", "FOLATE", "FERRITIN", "TIBC", "IRON", "RETICCTPCT" in the last 72 hours. Sepsis Labs: Recent Labs  Lab 08/21/22 1958  LATICACIDVEN 1.2    Recent Results (from the past 240 hour(s))  Culture, body fluid w Gram Stain-bottle     Status: None (Preliminary result)   Collection Time: 08/21/22  7:16 PM   Specimen: Fluid  Result Value Ref Range Status   Specimen Description FLUID  Final   Special Requests NONE  Final   Culture   Final    NO GROWTH 2 DAYS Performed at [facility], [address], [place], [place] [address]    Report Status PENDING  Incomplete  Gram stain     Status: None   Collection Time: 08/21/22  7:16 PM   Specimen: Fluid  Result Value Ref Range Status   Specimen Description FLUID  Final   Special  Requests NONE  Final   Gram Stain   Final    FEW WBC PRESENT, PREDOMINANTLY PMN NO ORGANISMS SEEN Performed at [facility], [address], [place], [place] [address]    Report Status 08/22/2022 FINAL  Final  Resp panel by RT-PCR (RSV, Flu A&B, Covid) Anterior Nasal Swab     Status: None   Collection Time: 08/21/22  7:41 PM   Specimen: Anterior Nasal Swab  Result Value Ref Range Status   SARS Coronavirus 2 by RT PCR NEGATIVE NEGATIVE Final    Comment: (NOTE) SARS-CoV-2 target nucleic acids are NOT DETECTED.  The SARS-CoV-2 RNA is generally detectable in upper respiratory specimens during the acute phase of infection. The lowest concentration of SARS-CoV-2 viral copies this assay can detect is 138 copies/mL. A negative result does not preclude SARS-Cov-2 infection and should not be used as the sole basis for treatment or other patient management decisions. A negative result may occur with  improper specimen collection/handling, submission of specimen other than nasopharyngeal swab, presence of viral mutation(s) within the areas targeted by this assay, and  inadequate number of viral copies(<138 copies/mL). A negative result must be combined with clinical observations, patient history, and epidemiological information. The expected result is Negative.  Fact Sheet for Patients:  EntrepreneurPulse.com.au  Fact Sheet for Healthcare Providers:  IncredibleEmployment.be  This test is no t yet approved or cleared by the [place] FDA and  has been authorized for detection and/or diagnosis of SARS-CoV-2 by FDA under an Emergency Use Authorization (EUA). This EUA will remain  in effect (meaning this test can be used) for the duration of the COVID-19 declaration under Section 564(b)(1) of the Act, 21 U.S.C.section 360bbb-3(b)(1), unless the authorization is terminated  or revoked sooner.       Influenza A by PCR NEGATIVE NEGATIVE Final   Influenza B by PCR NEGATIVE NEGATIVE Final    Comment: (NOTE) The Xpert Xpress SARS-CoV-2/FLU/RSV plus assay is intended as an aid in the diagnosis of influenza from Nasopharyngeal swab specimens and should not be used as a sole basis for treatment. Nasal washings and aspirates are unacceptable for Xpert Xpress SARS-CoV-2/FLU/RSV testing.  Fact Sheet for Patients: EntrepreneurPulse.com.au  Fact Sheet for Healthcare Providers: IncredibleEmployment.be  This test is not yet approved or cleared by the [place] FDA and has been authorized for detection and/or diagnosis of SARS-CoV-2 by FDA under an Emergency Use Authorization (EUA). This EUA will remain in effect (meaning this test can be used) for the duration of the COVID-19 declaration under Section 564(b)(1) of the Act, 21 U.S.C. section 360bbb-3(b)(1), unless the authorization is terminated or revoked.     Resp Syncytial Virus by PCR NEGATIVE NEGATIVE Final    Comment: (NOTE) Fact Sheet for Patients: EntrepreneurPulse.com.au  Fact Sheet for Healthcare  Providers: IncredibleEmployment.be  This test is not yet approved or cleared by the [place] FDA and has been authorized for detection and/or diagnosis of SARS-CoV-2 by FDA under an Emergency Use Authorization (EUA). This EUA will remain in effect (meaning this test can be used) for the duration of the COVID-19 declaration under Section 564(b)(1) of the Act, 21 U.S.C. section 360bbb-3(b)(1), unless the authorization is terminated or revoked.  Performed at [facility], [place] [address] [place], [place] [address]   Culture, blood (routine x 2)     Status: None (Preliminary result)   Collection Time: 08/21/22  7:58 PM   Specimen: BLOOD  Result Value Ref Range Status   Specimen  Description   Final    BLOOD PORTA CATH Performed at [facility], [place] [address], [place], [place] [address]    Special Requests   Final    BOTTLES DRAWN AEROBIC AND ANAEROBIC Blood Culture adequate volume Performed at [place] [address], [place], [place] [address]    Culture   Final    NO GROWTH 3 DAYS Performed at [facility], [place] [address] [place], [place] [address]    Report Status PENDING  Incomplete  Culture, blood (routine x 2)     Status: None (Preliminary result)   Collection Time: 08/21/22  7:58 PM   Specimen: BLOOD LEFT ARM  Result Value Ref Range Status   Specimen Description   Final    BLOOD LEFT ARM Performed at [facility], [place] [address], [place], [place] [address]    Special Requests   Final    BOTTLES DRAWN AEROBIC AND ANAEROBIC Blood Culture adequate volume Performed at [place] [address] [place], [place] [address]    Culture   Final    NO GROWTH 3 DAYS Performed at [facility], [place] [address] [place], [place] [address]    Report Status PENDING  Incomplete         Radiology Studies: DG Chest 2 View  Addendum Date: 08/24/2022    ADDENDUM REPORT: 08/24/2022 12:53 ADDENDUM: Findings were discussed with and acknowledged by Dr. Barb Merino. Electronically Signed   By: Sammie Bench M.D.   On: 08/24/2022 12:53   Result Date: 08/24/2022 CLINICAL DATA:  Follow up pneumothorax EXAM: CHEST - 2 VIEW COMPARISON:  08/23/2022 FINDINGS: Right-sided hydropneumothorax has increased in size and there is no evidence of shift of mediastinal structures to the left. There is alveolar consolidation both lungs consistent with edema. Cardiac silhouette is unremarkable. Right-sided Port-A-Cath tip superimposed with distal SVC. IMPRESSION: Right hydropneumothorax with an interval increase in size and new mediastinal shift. Diffuse alveolar consolidation lungs consistent with edema. Electronically Signed: By: Sammie Bench M.D. On: 08/24/2022 12:42   DG Chest 1 View  Result Date: 08/23/2022 CLINICAL DATA:  Right apical pneumothorax EXAM: CHEST  1 VIEW COMPARISON:  Chest radiograph dated 08/22/2022 FINDINGS: Lines/tubes: Right chest wall port tip projects over the superior cavoatrial junction. Chest: Increased right lung opacification. Minimal patchy and interstitial left mid lung opacities. Pleura: Slightly increased small right apical pneumothorax. Slightly increased trace right pleural effusion. Heart/mediastinum: Similar  cardiomediastinal silhouette. Bones: No acute osseous abnormality. IMPRESSION: 1. Slightly increased small right apical pneumothorax and trace right pleural effusion. 2. Increased right lung opacification, which may represent atelectasis, pneumonia, or aspiration. Electronically Signed   By: Darrin Nipper M.D.   On: 08/23/2022 15:00        Scheduled Meds:  aspirin EC  81 mg Oral Daily   Chlorhexidine Gluconate Cloth  6 each Topical Daily   clonazePAM  1 mg Oral BID   clopidogrel  75 mg Oral Daily   dexamethasone (DECADRON) injection  8 mg Intravenous Daily   diclofenac Sodium  4 g Topical QID   enoxaparin (LOVENOX)  injection  40 mg Subcutaneous Q24H   feeding supplement  1 Container Oral TID BM   folic acid  1 mg Oral Daily   multivitamin with minerals  1 tablet Oral Daily   OLANZapine  10 mg Oral Daily   rosuvastatin  10 mg Oral Daily   senna  1 tablet Oral QHS  sertraline  100 mg Oral BID   traZODone  150 mg Oral QHS   Vitamin D (Ergocalciferol)  50,000 Units Oral Weekly   Continuous Infusions:  famotidine (PEPCID) IV 20 mg (08/24/22 0954)   lactated ringers 1,000 mL with potassium chloride 40 mEq infusion 75 mL/hr at 08/24/22 0311   potassium PHOSPHATE IVPB (in mmol)       LOS: 2 days    Time spent: 40 minutes    Barb Merino, MD [facility] Pager [phone]

## 2022-08-25 ENCOUNTER — Encounter: Payer: Self-pay | Admitting: Hematology

## 2022-08-25 ENCOUNTER — Inpatient Hospital Stay (HOSPITAL_COMMUNITY): Payer: Commercial Managed Care - HMO

## 2022-08-25 DIAGNOSIS — G894 Chronic pain syndrome: Secondary | ICD-10-CM | POA: Diagnosis not present

## 2022-08-25 DIAGNOSIS — Z515 Encounter for palliative care: Secondary | ICD-10-CM | POA: Diagnosis not present

## 2022-08-25 DIAGNOSIS — J90 Pleural effusion, not elsewhere classified: Secondary | ICD-10-CM | POA: Diagnosis not present

## 2022-08-25 DIAGNOSIS — J948 Other specified pleural conditions: Secondary | ICD-10-CM | POA: Diagnosis not present

## 2022-08-25 DIAGNOSIS — R112 Nausea with vomiting, unspecified: Secondary | ICD-10-CM | POA: Diagnosis not present

## 2022-08-25 LAB — COMPREHENSIVE METABOLIC PANEL
ALT: 33 U/L (ref 0–44)
AST: 19 U/L (ref 15–41)
Albumin: 3.4 g/dL — ABNORMAL LOW (ref 3.5–5.0)
Alkaline Phosphatase: 96 U/L (ref 38–126)
Anion gap: 7 (ref 5–15)
BUN: 9 mg/dL (ref 6–20)
CO2: 23 mmol/L (ref 22–32)
Calcium: 7.5 mg/dL — ABNORMAL LOW (ref 8.9–10.3)
Chloride: 101 mmol/L (ref 98–111)
Creatinine, Ser: 0.57 mg/dL (ref 0.44–1.00)
GFR, Estimated: >60 mL/min (ref >60)
Glucose, Bld: 117 mg/dL — ABNORMAL HIGH (ref 70–99)
Potassium: 3.6 mmol/L (ref 3.5–5.1)
Sodium: 131 mmol/L — ABNORMAL LOW (ref 135–145)
Total Bilirubin: 0.7 mg/dL (ref 0.3–1.2)
Total Protein: 6.7 g/dL (ref 6.5–8.1)

## 2022-08-25 LAB — CBC WITH DIFFERENTIAL/PLATELET
Abs Immature Granulocytes: 0.11 10*3/uL — ABNORMAL HIGH (ref 0.00–0.07)
Basophils Absolute: 0 10*3/uL (ref 0.0–0.1)
Basophils Relative: 0 %
Eosinophils Absolute: 1 10*3/uL — ABNORMAL HIGH (ref 0.0–0.5)
Eosinophils Relative: 8 %
HCT: 36.1 % (ref 36.0–46.0)
Hemoglobin: 12.2 g/dL (ref 12.0–15.0)
Immature Granulocytes: 1 %
Lymphocytes Relative: 16 %
Lymphs Abs: 2 10*3/uL (ref 0.7–4.0)
MCH: 30.7 pg (ref 26.0–34.0)
MCHC: 33.8 g/dL (ref 30.0–36.0)
MCV: 90.9 fL (ref 80.0–100.0)
Monocytes Absolute: 0.8 10*3/uL (ref 0.1–1.0)
Monocytes Relative: 6 %
Neutro Abs: 8.7 10*3/uL — ABNORMAL HIGH (ref 1.7–7.7)
Neutrophils Relative %: 69 %
Platelets: 297 10*3/uL (ref 150–400)
RBC: 3.97 MIL/uL (ref 3.87–5.11)
RDW: 12.1 % (ref 11.5–15.5)
WBC: 12.6 10*3/uL — ABNORMAL HIGH (ref 4.0–10.5)
nRBC: 0 % (ref 0.0–0.2)

## 2022-08-25 LAB — URINALYSIS, ROUTINE W REFLEX MICROSCOPIC
Bilirubin Urine: NEGATIVE
Glucose, UA: NEGATIVE mg/dL
Hgb urine dipstick: NEGATIVE
Ketones, ur: NEGATIVE mg/dL
Leukocytes,Ua: NEGATIVE
Nitrite: NEGATIVE
Protein, ur: NEGATIVE mg/dL
Specific Gravity, Urine: 1.008 (ref 1.005–1.030)
pH: 7 (ref 5.0–8.0)

## 2022-08-25 LAB — MAGNESIUM: Magnesium: 2.1 mg/dL (ref 1.7–2.4)

## 2022-08-25 LAB — PHOSPHORUS: Phosphorus: 1.9 mg/dL — ABNORMAL LOW (ref 2.5–4.6)

## 2022-08-25 MED ORDER — TRIMETHOBENZAMIDE HCL 300 MG PO CAPS: 300.0000 mg | ORAL_CAPSULE | Freq: Three times a day (TID) | ORAL | Status: DC | PRN

## 2022-08-25 NOTE — Progress Notes (Signed)
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 treatment for those. -If no targetable lesions noted  will proceed with cycle 2 of carboplatin Alimta pembrolizumab chemotherapy. -Continue to optimize p.o. intake -Will continue to need close psychiatry follow-up -Monthly Delton See -Spanish interpreter used  Follow-up Dietician referral -RTC with portflush, labs and MD visit in 10-12 days for a toxicity check.   The total time spent in the appointment was 30 minutes*.  All of the patient's questions were answered with apparent satisfaction. The patient knows to call the clinic with any problems, questions or concerns.   Sullivan Lone MD MS AAHIVMS [facility] Hematology/Oncology Physician [facility]  .*Total Encounter Time as defined by the Centers for Medicare and Medicaid Services includes, in addition to the face-to-face time of a patient visit (documented in the note above) non-face-to-face time: obtaining and reviewing outside history, ordering and reviewing medications, tests or procedures, care coordination (communications with other health care professionals or caregivers) and documentation in the medical record.
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
## 2022-08-25 NOTE — Assessment & Plan Note (Signed)
-  Acute CVA in 07/2022 -Continue Plavix, rosuvstatin

## 2022-08-25 NOTE — Hospital Course (Signed)
Patient with h/o stage 4 NSCLC with bony mets, chronic back pain, HTN, and recent CVA who recently started chemotherapy with Carboplatin and Alimta on 2/28 presenting with intractable n/v.  She was found to have a new R-sided pleural effusion that is thought to be related to her underlying malignancy, s/p thoracentesis with 400 cc removed and then had pigtail catheter placed on 3/5.  Given intolerance to chemo and pace of fluid accumulation, she is likely to need Pleurx catheter placement prior to dc.

## 2022-08-25 NOTE — Progress Notes (Signed)
   08/25/22 1328  Assess: MEWS Score  Temp 98.2 F (36.8 C)  BP (!) 111/93  MAP (mmHg) 98  Pulse Rate (!) 112  Resp 16  Level of Consciousness Alert  SpO2 94 %  O2 Device Room Air  Assess: MEWS Score  MEWS Temp 0  MEWS Systolic 0  MEWS Pulse 2  MEWS RR 0  MEWS LOC 0  MEWS Score 2  MEWS Score Color Yellow  Assess: if the MEWS score is Yellow or Red  Were vital signs taken at a resting state? Yes  Focused Assessment No change from prior assessment  Does the patient meet 2 or more of the SIRS criteria? No  Does the patient have a confirmed or suspected source of infection? Yes  Provider and Rapid Response Notified? Yes  MEWS guidelines implemented  Yes, yellow  Treat  MEWS Interventions Considered administering scheduled or prn medications/treatments as ordered  Take Vital Signs  Increase Vital Sign Frequency  Yellow: Q2hr x1, continue Q4hrs until patient remains green for 12hrs  Escalate  MEWS: Escalate Yellow: Discuss with charge nurse and consider notifying provider and/or RRT  Notify: Charge Nurse/RN  Name of Charge Nurse/RN Notified Copy RN  Provider Notification  Provider Name/Title Yates  Date Provider Notified 08/25/22  Time Provider Notified 1406  Method of Notification Page  Notification Reason Other (Comment) (MEWS protocol)  Notify: Rapid Response  Name of Rapid Response RN Notified Ethan RN  Date Rapid Response Notified 08/25/22  Time Rapid Response Notified [id number]  Assess: SIRS CRITERIA  SIRS Temperature  0  SIRS Pulse 1  SIRS Respirations  0  SIRS WBC 0  SIRS Score Sum  1   MEWS Progress Note  Patient Details Name: Karen [name] MRN: [id number] DOB: 1965/08/10 Today's Date: 08/25/2022   MEWS Flowsheet Documentation:  Assess: MEWS Score Temp: 98.2 F (36.8 C) BP: (!) 111/93 MAP (mmHg): 98 Pulse Rate: (!) 112 ECG Heart Rate: (!) 104 Resp: 16 Level of Consciousness: Alert SpO2: 94 % O2 Device: Room Air O2 Flow Rate (L/min): 2  L/min Assess: MEWS Score MEWS Temp: 0 MEWS Systolic: 0 MEWS Pulse: 2 MEWS RR: 0 MEWS LOC: 0 MEWS Score: 2 MEWS Score Color: Yellow Assess: SIRS CRITERIA SIRS Temperature : 0 SIRS Respirations : 0 SIRS Pulse: 1 SIRS WBC: 0 SIRS Score Sum : 1 Assess: if the MEWS score is Yellow or Red Were vital signs taken at a resting state?: Yes Focused Assessment: No change from prior assessment Does the patient meet 2 or more of the SIRS criteria?: No Does the patient have a confirmed or suspected source of infection?: Yes Provider and Rapid Response Notified?: Yes MEWS guidelines implemented : Yes, yellow Treat MEWS Interventions: Considered administering scheduled or prn medications/treatments as ordered Take Vital Signs Increase Vital Sign Frequency : Yellow: Q2hr x1, continue Q4hrs until patient remains green for 12hrs Escalate MEWS: Escalate: Yellow: Discuss with charge nurse and consider notifying provider and/or RRT Notify: Charge Nurse/RN Name of Charge Nurse/RN Notified: Production designer, theatre/television/film Provider Notification Provider Name/Title: Yates Date Provider Notified: 08/25/22 Time Provider Notified: [id number] Method of Notification: Page Notification Reason: Other (Comment) (MEWS protocol) Notify: Rapid Response Name of Rapid Response RN Notified: Ethan RN Date Rapid Response Notified: 08/25/22 Time Rapid Response Notified: Beasley 08/25/2022, 3:30 PM

## 2022-08-25 NOTE — Progress Notes (Signed)
Progress Note   Patient: Karen [name] [id number] DOB: 12/07/1965 DOA: 08/21/2022     3 DOS: the patient was seen and examined on 08/25/2022   Brief hospital course: Patient with h/o stage 4 NSCLC with bony mets, chronic back pain, HTN, and recent CVA who recently started chemotherapy with Carboplatin and Alimta on 2/28 presenting with intractable n/v.  She was found to have a new R-sided pleural effusion that is thought to be related to her underlying malignancy, s/p thoracentesis with 400 cc removed and then had pigtail catheter placed on 3/5.  Given intolerance to chemo and pace of fluid accumulation, she is likely to need Pleurx catheter placement prior to dc.  Assessment and Plan: * Pleural effusion on right -Patient with known R lung adenocarcinoma, recently started chemotherapy -She has developed a R-sided pleural effusion, likely malignant -Chest tube was placed yesterday, currently on waterseal -She may need a Pleurx at the time of discharge based on recurrent accumulation and underlying malignancy, per pulm  Intractable nausea and vomiting -Thought to be related to induction of chemotherapy -Seems to be improved -Continue antiemetics -Will start clear liquids and advance as tolerated -Continue decadron, Tigan, compazine  Cancer associated pain -Cancer pain superimposed on CPS -Palliative care is consulting for acute on chronic pain as well as worsening cancer and symptoms over this past month.  Primary adenocarcinoma of lung ([place]) -Stage 4 with bony metastasis -1st round of induction chemo on 2/28 -Awaiting input from Dr. Irene Limbo, who has been notified of admission -Palliative care is consulting  History of ischemic stroke -Acute CVA in 07/2022 -Continue Plavix, rosuvstatin  Major depression, recurrent, chronic ([place]) -Prior h/o suicide attempts x 2 -Continue Klonopin, Zoloft, Trazodone         Subjective: She reports some pain in the area of the port.  She  also reported dysuria and urgency to IR today.  Otherwise no significant complaints.  Physical Exam: Vitals:   08/24/22 2045 08/24/22 2159 08/25/22 0531 08/25/22 1328  BP: 121/74 128/82 135/85 (!) 111/93  Pulse: 76 100 (!) 108 (!) 112  Resp: '20 17 17 16  '$ Temp: (!) 97.5 F (36.4 C) 98.3 F (36.8 C) 98.2 F (36.8 C) 98.2 F (36.8 C)  TempSrc: Oral Oral Oral Oral  SpO2: 99% 94% 96% 94%  Weight:      Height:       General:  Appears calm and comfortable and is in NAD, thin Eyes:  EOMI, normal lids, iris ENT:  grossly normal hearing, lips & tongue, mmm Neck:  no LAD, masses or thyromegaly Cardiovascular:  RRR, no m/r/g. No LE edema.  Respiratory:   Diminished breath sounds on the right with chest tube in place superior to R breast, no drainage in canister.  Normal respiratory effort. Abdomen:  soft, NT, ND Skin:  no rash or induration seen on limited exam Musculoskeletal:  grossly normal tone BUE/BLE, good ROM, no bony abnormality Psychiatric:  blunted mood and affect, speech fluent in Spanish Neurologic:  CN 2-12 grossly intact, moves all extremities in coordinated fashion   Radiological Exams on Admission: Independently reviewed - see discussion in A/P where applicable  CT PERC PLEURAL DRAIN W/INDWELL CATH W/IMG GUIDE  Result Date: 08/25/2022 INDICATION: History of right-sided lung cancer, now with enlarging, symptomatic right-sided hydropneumothorax following recent thoracentesis. Please perform image guided placement of right-sided chest tube. EXAM: CT PERC PLEURAL DRAIN W/INDWELL CATH W/IMG GUIDE COMPARISON:  Chest radiograph-earlier same day; 08/23/2022; 08/22/2022; 08/21/2022 Ultrasound-guided right-sided thoracentesis-08/22/2022 Chest CT-08/21/2022 PET-CT-08/18/2022  MEDICATIONS: The patient is currently admitted to the hospital and receiving intravenous antibiotics. The antibiotics were administered within an appropriate time frame prior to the initiation of the procedure.  ANESTHESIA/SEDATION: Moderate (conscious) sedation was employed during this procedure. A total of Versed 2 mg and Fentanyl 100 mcg was administered intravenously. Moderate Sedation Time: 21 minutes. The patient's level of consciousness and vital signs were monitored continuously by radiology nursing throughout the procedure under my direct supervision. CONTRAST:  None COMPLICATIONS: None immediate. PROCEDURE: RADIATION DOSE REDUCTION: This exam was performed according to the departmental dose-optimization program which includes automated exposure control, adjustment of the mA and/or kV according to patient size and/or use of iterative reconstruction technique. Informed written consent was obtained from the patient after a discussion of the risks, benefits and alternatives to treatment. The patient was placed supine on the CT gantry and a pre procedural CT was performed re-demonstrating the known moderate-sized right-sided hydropneumothorax. The procedure was planned utilizing an anterolateral approach with attention made to avoid the port a catheter reservoir site. A timeout was performed prior to the initiation of the procedure. The skin overlying the right upper chest was prepped and draped in the usual sterile fashion. The overlying soft tissues were anesthetized with 1% lidocaine with epinephrine. Next, an 13 gauge trocar needle was utilized to access the superolateral aspect of the right pleural space. Appropriate position was confirmed with the aspiration of air. Next, a short Amplatz wire was coiled within the apical aspect of the right pleural space. Appropriate position was confirmed with CT imaging (series 6). Next, the tract was serially dilated allowing placement of a 10 [place] all-purpose drainage catheter. Appropriate positioning was confirmed with a limited postprocedural CT scan. Next, air and approximately 250 cc of serous fluid was aspirated. The tube was connected to a pleura vac device and  sutured in place. A dressing was applied. The patient tolerated the procedure well without immediate post procedural complication. IMPRESSION: Successful CT guided placement of a 10 [place] all purpose drain catheter into the apical aspects of right-sided hydropneumothorax with aspiration of air and approximately 250 cc of serous fluid. Electronically Signed   By: Sandi Mariscal M.D.   On: 08/25/2022 09:45   DG CHEST PORT 1 VIEW  Result Date: 08/25/2022 CLINICAL DATA:  Right pneumothorax. Mediastinal masses, no malignancy. EXAM: PORTABLE CHEST 1 VIEW COMPARISON:  Chest radiographs 08/24/2022 at 12:25 p.m., 08/23/2022, 08/22/2022; CT chest 08/21/2022; CT-guided percutaneous chest tube placement 08/24/2022 at 3 p.m. FINDINGS: The patient underwent CT-guided percutaneous chest tube placement on the right 08/24/2022 at 3 p.m. Compared to 08/24/2022 at 12:25 p.m. chest radiograph, there is a new right-sided chest tube with pigtail overlying the lateral right hemithorax at mid height. Interval decrease in right pneumothorax, with the extrapleural air space measuring approximately 2.3 cm in craniocaudal dimension at the superior right hemithorax compared to 5.3 cm on 08/24/2022 prior to the chest tube placement. The prior layering right basilar air-fluid level is no longer visualized. No left pneumothorax. There are patchy heterogeneous airspace opacities throughout the right mid to lower lung and left perihilar region, similar to prior. Cardiac silhouette is normal in size. Right greater than left hilar fullness consistent with the mediastinal masses seen on prior CT. Right chest wall porta catheter tip overlies the central superior vena cava/right atrial junction. No acute skeletal abnormality. IMPRESSION: Compared to 08/24/2022 at 12:25 p.m.: 1. New right-sided chest tube with pigtail overlying the lateral right hemithorax at mid height. 2. Interval decrease in  right pneumothorax, with the extrapleural air space  measuring approximately 2.3 cm in craniocaudal dimension at the superior right hemithorax compared to 5.3 cm on 08/24/2022 prior to the chest tube placement. Electronically Signed   By: Yvonne Kendall M.D.   On: 08/25/2022 08:35   DG Chest 2 View  Addendum Date: 08/24/2022   ADDENDUM REPORT: 08/24/2022 12:53 ADDENDUM: Findings were discussed with and acknowledged by Dr. Barb Merino. Electronically Signed   By: Sammie Bench M.D.   On: 08/24/2022 12:53   Result Date: 08/24/2022 CLINICAL DATA:  Follow up pneumothorax EXAM: CHEST - 2 VIEW COMPARISON:  08/23/2022 FINDINGS: Right-sided hydropneumothorax has increased in size and there is no evidence of shift of mediastinal structures to the left. There is alveolar consolidation both lungs consistent with edema. Cardiac silhouette is unremarkable. Right-sided Port-A-Cath tip superimposed with distal SVC. IMPRESSION: Right hydropneumothorax with an interval increase in size and new mediastinal shift. Diffuse alveolar consolidation lungs consistent with edema. Electronically Signed: By: Sammie Bench M.D. On: 08/24/2022 12:42    EKG: none today   Labs on Admission: I have personally reviewed the available labs and imaging studies at the time of the admission.  Pertinent labs:    Na++ 131 Glucose 117 Phos 1.9 Albumin 3.4 WBC 12.6  Family Communication: None present; I spoke with her husband in the afternoon.  He understands that she has an aggressive cancer and wants her home to allow him to care for her as soon as she is able to be successful there.   Disposition: Status is: Inpatient Remains inpatient appropriate because: chest tube in place  Planned Discharge Destination: Home   Time spent: 50 minutes  Author: Karmen Bongo, MD 08/25/2022 2:49 PM  For on call review [email or web address].
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
## 2022-08-25 NOTE — Progress Notes (Signed)
Referring Physician(s): [place]  Supervising Physician: Arne Cleveland  Patient Status:  [facility] - In-pt  Chief Complaint:  Lung cancer, chest pain,  right hydropneumothorax   Subjective: Pt with some soreness at rt chest tube insertion site; does report some improvement in breathing since tube placement yesterday  Allergies: Lidocaine, Percocet [oxycodone-acetaminophen], Hydromorphone hcl, and Oxycodone-acetaminophen  Medications: Prior to Admission medications   Medication Sig Start Date End Date Taking? Authorizing Provider  albuterol (VENTOLIN HFA) 108 (90 Base) MCG/ACT inhaler Inhale 2 puffs into the lungs every 6 (six) hours as needed for wheezing or shortness of breath. 05/31/22  Yes [name], MD  aspirin EC 81 MG tablet Take 1 tablet (81 mg total) by mouth daily. Swallow whole. 08/17/22  Yes Linus Galas, MD  clonazePAM (KLONOPIN) 1 MG tablet Take 1 tablet (1 mg total) by mouth 2 (two) times daily. 08/17/22  Yes Linus Galas, MD  clopidogrel (PLAVIX) 75 MG tablet Take 1 tablet (75 mg total) by mouth daily. 08/18/22  Yes Linus Galas, MD  dexamethasone (DECADRON) 4 MG tablet Take 1 tab 2 times daily starting day before pemetrexed. Then take 2 tabs daily x 3 days starting day after carboplatin. Take with food. 08/05/22  Yes Brunetta Genera, MD  diclofenac Sodium (VOLTAREN) 1 % GEL Apply 4 g topically 4 (four) times daily. Patient taking differently: Apply 4 g topically daily as needed. 08/04/22  Yes Linus Galas, MD  ergocalciferol (VITAMIN D2) 1.25 MG (50000 UT) capsule Take 1 capsule (50,000 Units total) by mouth once a week. 08/18/22  Yes Brunetta Genera, MD  folic acid (FOLVITE) 1 MG tablet Take 1 tablet (1 mg total) by mouth daily. Start 7 days before pemetrexed chemotherapy. Continue until 21 days after pemetrexed completed. 08/05/22  Yes Brunetta Genera, MD  HYDROcodone-acetaminophen (NORCO/VICODIN) 5-325 MG tablet  Take 1-2 tablets by mouth every 6 (six) hours as needed for moderate pain or severe pain. Do not drive after taking [id number]  Yes Pickenpack-Cousar, Athena N, NP  lidocaine (XYLOCAINE) 2 % solution Use as directed 15 mLs in the mouth or throat every 4 (four) hours as needed for mouth pain (sore throat). 08/04/22  Yes Linus Galas, MD  lidocaine-prilocaine (EMLA) cream Apply to affected area once Patient taking differently: Apply 1 Application topically daily as needed (For pain). 08/05/22  Yes Brunetta Genera, MD  ondansetron (ZOFRAN-ODT) 8 MG disintegrating tablet Take 1 tablet (8 mg total) by mouth every 8 (eight) hours as needed for nausea or vomiting. 08/04/22  Yes Linus Galas, MD  prochlorperazine (COMPAZINE) 10 MG tablet Take 1 tablet (10 mg total) by mouth every 6 (six) hours as needed for refractory nausea / vomiting. 08/04/22  Yes Linus Galas, MD  rosuvastatin (CRESTOR) 10 MG tablet Take 1 tablet (10 mg total) by mouth daily. 08/17/22  Yes Linus Galas, MD  sertraline (ZOLOFT) 100 MG tablet Take 100 mg by mouth 2 (two) times daily. 06/24/22  Yes [provider]  traZODone (DESYREL) 150 MG tablet Take 1 tablet (150 mg total) by mouth at bedtime as needed for sleep. Patient taking differently: Take 150 mg by mouth at bedtime. 08/17/22  Yes Linus Galas, MD  trimethobenzamide (TIGAN) 300 MG capsule Take 1 capsule (300 mg total) by mouth 3 (three) times daily as needed for nausea/vomiting. Tome 1 capsula (300 mg en total) por via oral 3 (tres) veces al dia segun sea necesario para las nauseas/vomitos. 08/12/22 08/12/23 Yes [name], MD  propranolol (INDERAL) 40  MG tablet Take 1 tablet (40 mg total) by mouth 2 (two) times daily. Patient not taking: Reported on 08/19/2022 08/04/22   Linus Galas, MD     Vital Signs: BP 135/85 (BP Location: Right Arm)   Pulse (!) 108   Temp 98.2 F (36.8 C) (Oral)   Resp 17   Ht '5\' 2"'$  (1.575  m)   Wt 100 lb 15.5 oz (45.8 kg)   LMP 02/08/2006   SpO2 96%   BMI 18.47 kg/m   Physical Exam awake/alert; rt chest tube intact, OP yesterday 250 cc, tube currently to waterseal per CCM  Imaging: CT [facility] PLEURAL DRAIN W/INDWELL CATH W/IMG GUIDE  Result Date: 08/25/2022 INDICATION: History of right-sided lung cancer, now with enlarging, symptomatic right-sided hydropneumothorax following recent thoracentesis. Please perform image guided placement of right-sided chest tube. EXAM: CT [facility] PLEURAL DRAIN W/INDWELL CATH W/IMG GUIDE COMPARISON:  Chest radiograph-earlier same day; 08/23/2022; 08/22/2022; 08/21/2022 Ultrasound-guided right-sided thoracentesis-08/22/2022 Chest CT-08/21/2022 PET-CT-08/18/2022 MEDICATIONS: The patient is currently admitted to the hospital and receiving intravenous antibiotics. The antibiotics were administered within an appropriate time frame prior to the initiation of the procedure. ANESTHESIA/SEDATION: Moderate (conscious) sedation was employed during this procedure. A total of Versed 2 mg and Fentanyl 100 mcg was administered intravenously. Moderate Sedation Time: 21 minutes. The patient's level of consciousness and vital signs were monitored continuously by radiology nursing throughout the procedure under my direct supervision. CONTRAST:  None COMPLICATIONS: None immediate. PROCEDURE: RADIATION DOSE REDUCTION: This exam was performed according to the departmental dose-optimization program which includes automated exposure control, adjustment of the mA and/or kV according to patient size and/or use of iterative reconstruction technique. Informed written consent was obtained from the patient after a discussion of the risks, benefits and alternatives to treatment. The patient was placed supine on the CT gantry and a pre procedural CT was performed re-demonstrating the known moderate-sized right-sided hydropneumothorax. The procedure was planned utilizing an anterolateral approach  with attention made to avoid the port a catheter reservoir site. A timeout was performed prior to the initiation of the procedure. The skin overlying the right upper chest was prepped and draped in the usual sterile fashion. The overlying soft tissues were anesthetized with 1% lidocaine with epinephrine. Next, an 44 gauge trocar needle was utilized to access the superolateral aspect of the right pleural space. Appropriate position was confirmed with the aspiration of air. Next, a short Amplatz wire was coiled within the apical aspect of the right pleural space. Appropriate position was confirmed with CT imaging (series 6). Next, the tract was serially dilated allowing placement of a 10 [place] all-purpose drainage catheter. Appropriate positioning was confirmed with a limited postprocedural CT scan. Next, air and approximately 250 cc of serous fluid was aspirated. The tube was connected to a pleura vac device and sutured in place. A dressing was applied. The patient tolerated the procedure well without immediate post procedural complication. IMPRESSION: Successful CT guided placement of a 10 French all purpose drain catheter into the apical aspects of right-sided hydropneumothorax with aspiration of air and approximately 250 cc of serous fluid. Electronically Signed   By: Sandi Mariscal M.D.   On: 08/25/2022 09:45   DG CHEST PORT 1 VIEW  Result Date: 08/25/2022 CLINICAL DATA:  Right pneumothorax. Mediastinal masses, no malignancy. EXAM: PORTABLE CHEST 1 VIEW COMPARISON:  Chest radiographs 08/24/2022 at 12:25 p.m., 08/23/2022, 08/22/2022; CT chest 08/21/2022; CT-guided percutaneous chest tube placement 08/24/2022 at 3 p.m. FINDINGS: The patient underwent CT-guided percutaneous chest tube placement  on the right 08/24/2022 at 3 p.m. Compared to 08/24/2022 at 12:25 p.m. chest radiograph, there is a new right-sided chest tube with pigtail overlying the lateral right hemithorax at mid height. Interval decrease in right  pneumothorax, with the extrapleural air space measuring approximately 2.3 cm in craniocaudal dimension at the superior right hemithorax compared to 5.3 cm on 08/24/2022 prior to the chest tube placement. The prior layering right basilar air-fluid level is no longer visualized. No left pneumothorax. There are patchy heterogeneous airspace opacities throughout the right mid to lower lung and left perihilar region, similar to prior. Cardiac silhouette is normal in size. Right greater than left hilar fullness consistent with the mediastinal masses seen on prior CT. Right chest wall porta catheter tip overlies the central superior vena cava/right atrial junction. No acute skeletal abnormality. IMPRESSION: Compared to 08/24/2022 at 12:25 p.m.: 1. New right-sided chest tube with pigtail overlying the lateral right hemithorax at mid height. 2. Interval decrease in right pneumothorax, with the extrapleural air space measuring approximately 2.3 cm in craniocaudal dimension at the superior right hemithorax compared to 5.3 cm on 08/24/2022 prior to the chest tube placement. Electronically Signed   By: Yvonne Kendall M.D.   On: 08/25/2022 08:35   DG Chest 2 View  Addendum Date: 08/24/2022   ADDENDUM REPORT: 08/24/2022 12:53 ADDENDUM: Findings were discussed with and acknowledged by Dr. Barb Merino. Electronically Signed   By: Sammie Bench M.D.   On: 08/24/2022 12:53   Result Date: 08/24/2022 CLINICAL DATA:  Follow up pneumothorax EXAM: CHEST - 2 VIEW COMPARISON:  08/23/2022 FINDINGS: Right-sided hydropneumothorax has increased in size and there is no evidence of shift of mediastinal structures to the left. There is alveolar consolidation both lungs consistent with edema. Cardiac silhouette is unremarkable. Right-sided Port-A-Cath tip superimposed with distal SVC. IMPRESSION: Right hydropneumothorax with an interval increase in size and new mediastinal shift. Diffuse alveolar consolidation lungs consistent with edema.  Electronically Signed: By: Sammie Bench M.D. On: 08/24/2022 12:42   DG Chest 1 View  Result Date: 08/23/2022 CLINICAL DATA:  Right apical pneumothorax EXAM: CHEST  1 VIEW COMPARISON:  Chest radiograph dated 08/22/2022 FINDINGS: Lines/tubes: Right chest wall port tip projects over the superior cavoatrial junction. Chest: Increased right lung opacification. Minimal patchy and interstitial left mid lung opacities. Pleura: Slightly increased small right apical pneumothorax. Slightly increased trace right pleural effusion. Heart/mediastinum: Similar  cardiomediastinal silhouette. Bones: No acute osseous abnormality. IMPRESSION: 1. Slightly increased small right apical pneumothorax and trace right pleural effusion. 2. Increased right lung opacification, which may represent atelectasis, pneumonia, or aspiration. Electronically Signed   By: Darrin Nipper M.D.   On: 08/23/2022 15:00   US THORACENTESIS ASP PLEURAL SPACE W/IMG GUIDE  Result Date: 08/22/2022 INDICATION: Lung cancer with right pleural effusion. Request for diagnostic and therapeutic thoracentesis. EXAM: ULTRASOUND GUIDED RIGHT THORACENTESIS MEDICATIONS: 1% lidocaine 4 mL COMPLICATIONS: SIR Level A - No therapy, no consequence. PROCEDURE: An ultrasound guided thoracentesis was thoroughly discussed with the patient and questions answered. The benefits, risks, alternatives and complications were also discussed. The patient understands and wishes to proceed with the procedure. Written consent was obtained. Ultrasound was performed to localize and mark an adequate pocket of fluid in the right chest. The area was then prepped and draped in the normal sterile fashion. 1% Lidocaine was used for local anesthesia. Under ultrasound guidance a 6 Fr Safe-T-Centesis catheter was introduced. Thoracentesis was performed. The catheter was removed and a dressing applied. FINDINGS: A total of approximately  420 mL of clear amber fluid was removed. Samples were sent to the  laboratory as requested by the clinical team. IMPRESSION: Successful ultrasound guided right thoracentesis yielding 420 mL of pleural fluid. Post-procedure chest x-ray shows small apical pneumothorax most likely consistent with incomplete re-expansion due to known lung carcinoma. We will repeat chest x-ray tomorrow morning to ensure stability. Procedure performed by: Gareth Eagle, PA-C Electronically Signed   By: Aletta Edouard M.D.   On: 08/22/2022 11:06   DG Chest 1 View  Result Date: 08/22/2022 CLINICAL DATA:  Status post right thoracentesis. EXAM: CHEST  1 VIEW COMPARISON:  08/21/2022 FINDINGS: Interval decreased right pleural effusion. Small right apical pneumothorax evident. Right suprahilar and parahilar airspace disease again noted with patchy subtle parahilar opacities in the left lung. The visualized bony structures of the thorax are unremarkable. Telemetry leads overlie the chest. IMPRESSION: 1. Interval decreased right pleural effusion status post thoracentesis. Small right apical pneumothorax. 2. Right suprahilar and parahilar airspace disease with patchy subtle parahilar opacities in the left lung. These results will be called to the ordering clinician or representative by the Radiologist Assistant, and communication documented in the PACS or [employer]. Electronically Signed   By: Misty Stanley M.D.   On: 08/22/2022 10:28   CT Angio Chest PE W and/or Wo Contrast  Result Date: 08/21/2022 CLINICAL DATA:  Concern for pulmonary embolism. History hands cold post noted it cold EXAM: CT ANGIOGRAPHY CHEST WITH CONTRAST TECHNIQUE: Multidetector CT imaging of the chest was performed using the standard protocol during bolus administration of intravenous contrast. Multiplanar CT image reconstructions and MIPs were obtained to evaluate the vascular anatomy. RADIATION DOSE REDUCTION: This exam was performed according to the departmental dose-optimization program which includes automated exposure  control, adjustment of the mA and/or kV according to patient size and/or use of iterative reconstruction technique. CONTRAST:  25m OMNIPAQUE IOHEXOL 350 MG/ML SOLN COMPARISON:  Chest CT dated 06/25/2022 and radiograph dated 08/21/2022. FINDINGS: Evaluation of this exam is limited due to respiratory motion artifact. Cardiovascular: There is no cardiomegaly small pericardial effusion, new since the prior CT and measuring 1 cm in thickness. The thoracic aorta is unremarkable. The origins of the great vessels of the aortic arch appear patent. Evaluation of the pulmonary arteries is limited due to respiratory motion. No central pulmonary artery embolus identified. Mediastinum/Nodes: There is infiltrative soft tissue the mediastinum and bilateral hila in keeping with known malignancy. No mediastinal fluid collection. Right-sided Port-A-Cath with tip at the cavoatrial junction. Lungs/Pleura: There is a moderate size right and trace left pleural effusions, new since the prior CT and suspicious for malignant effusion. There is partial compressive atelectasis of the right lower lobe. Patchy area of consolidation in the right perihilar region extending into the right upper lobe and right middle lobes may represent combination of tumor infiltration, atelectasis, or pneumonia. There is however diffuse interstitial thickening and nodularity primarily involving the upper lobes bilaterally as well as in the lower lobes suspicious for lymphangitic carcinomatosis. There is no pneumothorax. There is narrowing of the right upper lobe bronchus as well as narrowing of the right middle and right lower lobe bronchi. The central airways remain patent. Upper Abdomen: Small calcified splenic granuloma. Musculoskeletal: Sclerotic lesions involving T10, T12, and L3 consistent with metastatic disease. No acute osseous pathology. Review of the MIP images confirms the above findings. IMPRESSION: 1. No CT evidence of central pulmonary artery  embolus. 2. Moderate size right and trace left pleural effusions, new since the prior CT  and suspicious for malignant effusion. 3. Infiltrative mediastinal and hilar mass in keeping with known malignancy. 4. Significant progression of disease with bilateral perihilar consolidation and findings 5. suspicious for lymphangitic carcinomatosis. 6. Sclerotic lesions involving T10, T12, and L3 consistent with metastatic disease. Electronically Signed   By: Anner Crete M.D.   On: 08/21/2022 20:53   DG Chest Port 1 View  Result Date: 08/21/2022 CLINICAL DATA:  Shortness of breath EXAM: PORTABLE CHEST 1 VIEW COMPARISON:  PET-CT dated July 29, 2022 and CT examination dated August 13, 2022 FINDINGS: The heart is normal in size. Pulmonary vascular congestion with bilateral perihilar opacities consistent with patient's known hilar lymphadenopathy. Small bilateral pleural effusions. Right IJ access MediPort with distal tip in the SVC no acute osseous abnormality IMPRESSION: 1. Pulmonary vascular congestion with bilateral perihilar opacities consistent with patient's known hilar lymphadenopathy. 2. Small bilateral pleural effusions. Electronically Signed   By: Keane Police D.O.   On: 08/21/2022 19:59    Labs:  CBC: Recent Labs    08/21/22 1930 08/22/22 0544 08/23/22 0515 08/25/22 0548  WBC 21.1* 20.0* 14.3* 12.6*  HGB 13.5 12.4 11.8* 12.2  HCT 39.6 36.9 35.6* 36.1  PLT 469* 403* 364 297    COAGS: Recent Labs    07/01/22 1122  INR 1.0    BMP: Recent Labs    08/21/22 1930 08/22/22 0544 08/23/22 0515 08/25/22 0548  NA 133* 135 132* 131*  K 3.3* 4.0 3.7 3.6  CL 105 107 105 101  CO2 18* 21* 21* 23  GLUCOSE 124* 105* 105* 117*  BUN '16 13 10 9  '$ CALCIUM 8.0* 7.8* 7.3* 7.5*  CREATININE 0.54 0.53 0.52 0.57  GFRNONAA >60 >60 >60 >60    LIVER FUNCTION TESTS: Recent Labs    08/21/22 1930 08/22/22 0544 08/23/22 0515 08/25/22 0548  BILITOT 0.7 0.7 0.6 0.7  AST 34 '28 19 19  '$ ALT 54* 44  34 33  ALKPHOS 129* 127* 109 96  PROT 7.5 6.6 6.3* 6.7  ALBUMIN 4.0 3.6 3.4* 3.4*    Assessment and Plan: Pt with hx stage IV lung cancer, recent rt thoracentesis with subsequent enlarging rt hydroptx, likely component of trapped lung as well; s/p rt chest tube placement 08/24/22; afebrile, remains sl tachy, WBC 12.6(14.3, hgb stable, creat nl, Ca++ 7.5; CXR today- 1. New right-sided chest tube with pigtail overlying the lateral right hemithorax at mid height. 2. Interval decrease in right pneumothorax, with the extrapleural air space measuring approximately 2.3 cm in craniocaudal dimension at the superior right hemithorax compared to 5.3 cm on 08/24/2022 prior to the chest tube placement  Tube currently to waterseal per CCM- further plans per their team; monitor CXR   Electronically Signed: D. Rowe Robert, PA-C 08/25/2022, 10:12 AM   I spent a total of 15 Minutes at the the patient's bedside AND on the patient's hospital floor or unit, greater than 50% of which was counseling/coordinating care for right chest tube placement    Patient ID: [name], female   DOB: 1965/12/21, 57 y.o.   MRN: [id number]

## 2022-08-25 NOTE — Assessment & Plan Note (Signed)
-  Prior h/o suicide attempts x 2 -Continue Klonopin, Zoloft, Trazodone

## 2022-08-26 ENCOUNTER — Other Ambulatory Visit: Payer: Self-pay | Admitting: Hematology

## 2022-08-26 ENCOUNTER — Telehealth: Payer: Self-pay | Admitting: Pharmacy Technician

## 2022-08-26 ENCOUNTER — Inpatient Hospital Stay (HOSPITAL_COMMUNITY): Payer: Commercial Managed Care - HMO

## 2022-08-26 ENCOUNTER — Other Ambulatory Visit (HOSPITAL_COMMUNITY): Payer: Self-pay

## 2022-08-26 ENCOUNTER — Telehealth: Payer: Self-pay | Admitting: Pharmacist

## 2022-08-26 ENCOUNTER — Other Ambulatory Visit: Payer: Self-pay

## 2022-08-26 ENCOUNTER — Encounter: Payer: Self-pay | Admitting: Hematology

## 2022-08-26 DIAGNOSIS — J910 Malignant pleural effusion: Secondary | ICD-10-CM | POA: Diagnosis not present

## 2022-08-26 DIAGNOSIS — F339 Major depressive disorder, recurrent, unspecified: Secondary | ICD-10-CM | POA: Diagnosis not present

## 2022-08-26 DIAGNOSIS — C3490 Malignant neoplasm of unspecified part of unspecified bronchus or lung: Secondary | ICD-10-CM | POA: Diagnosis not present

## 2022-08-26 DIAGNOSIS — J90 Pleural effusion, not elsewhere classified: Secondary | ICD-10-CM | POA: Diagnosis not present

## 2022-08-26 DIAGNOSIS — R112 Nausea with vomiting, unspecified: Secondary | ICD-10-CM | POA: Diagnosis not present

## 2022-08-26 DIAGNOSIS — Z7189 Other specified counseling: Secondary | ICD-10-CM | POA: Diagnosis not present

## 2022-08-26 LAB — CULTURE, BLOOD (ROUTINE X 2)
Culture: NO GROWTH
Culture: NO GROWTH
Special Requests: ADEQUATE
Special Requests: ADEQUATE

## 2022-08-26 MED ORDER — PANTOPRAZOLE SODIUM 20 MG PO TBEC
20.0000 mg | DELAYED_RELEASE_TABLET | Freq: Every day | ORAL | Status: DC
  Administered 2022-08-26 – 2022-08-30 (×5): 20 mg via ORAL
  Filled 2022-08-26 (×6): qty 1

## 2022-08-26 MED ORDER — FENTANYL 12 MCG/HR TD PT72
1.0000 | MEDICATED_PATCH | TRANSDERMAL | Status: DC
  Administered 2022-08-26 – 2022-09-01 (×3): 1 via TRANSDERMAL
  Filled 2022-08-26 (×3): qty 1

## 2022-08-26 MED ORDER — POTASSIUM PHOSPHATES 15 MMOLE/5ML IV SOLN
20.0000 mmol | Freq: Once | INTRAVENOUS | Status: AC
  Administered 2022-08-26: 20 mmol via INTRAVENOUS
  Filled 2022-08-26: qty 6.67

## 2022-08-26 MED ORDER — DEXAMETHASONE 2 MG PO TABS
2.0000 mg | ORAL_TABLET | Freq: Every day | ORAL | 1 refills | Status: DC
  Filled 2022-08-26 – 2022-09-07 (×2): qty 30, 30d supply, fill #0

## 2022-08-26 MED ORDER — SODIUM CHLORIDE 0.9 % IV SOLN
8.0000 mg | Freq: Two times a day (BID) | INTRAVENOUS | Status: DC
  Administered 2022-08-26: 8 mg via INTRAVENOUS
  Filled 2022-08-26 (×4): qty 4

## 2022-08-26 MED ORDER — METOCLOPRAMIDE HCL 5 MG/ML IJ SOLN
10.0000 mg | Freq: Three times a day (TID) | INTRAMUSCULAR | Status: DC
  Administered 2022-08-26 – 2022-08-31 (×15): 10 mg via INTRAVENOUS
  Filled 2022-08-26 (×15): qty 2

## 2022-08-26 MED ORDER — OLANZAPINE 5 MG PO TBDP
10.0000 mg | ORAL_TABLET | Freq: Every day | ORAL | Status: DC
  Administered 2022-08-26 – 2022-08-30 (×5): 10 mg via ORAL
  Filled 2022-08-26 (×6): qty 2

## 2022-08-26 MED ORDER — TRAMETINIB DIMETHYL SULFOXIDE 2 MG PO TABS
2.0000 mg | ORAL_TABLET | Freq: Every day | ORAL | 2 refills | Status: DC
  Filled 2022-08-26 – 2022-08-30 (×3): qty 30, 30d supply, fill #0

## 2022-08-26 MED ORDER — DABRAFENIB MESYLATE 75 MG PO CAPS
150.0000 mg | ORAL_CAPSULE | Freq: Two times a day (BID) | ORAL | 2 refills | Status: DC
  Filled 2022-08-26 – 2022-08-30 (×3): qty 120, 30d supply, fill #0

## 2022-08-26 MED ORDER — SODIUM CHLORIDE 0.9 % IV SOLN: 16.0000 mg | Freq: Two times a day (BID) | INTRAVENOUS | Status: DC

## 2022-08-26 MED ORDER — DRONABINOL 5 MG PO CAPS
5.0000 mg | ORAL_CAPSULE | Freq: Two times a day (BID) | ORAL | Status: DC
  Administered 2022-08-27 – 2022-08-30 (×3): 5 mg via ORAL
  Filled 2022-08-26 (×5): qty 1

## 2022-08-26 MED ORDER — HYDROCODONE-ACETAMINOPHEN 5-325 MG PO TABS
2.0000 | ORAL_TABLET | Freq: Four times a day (QID) | ORAL | Status: DC | PRN
  Administered 2022-08-26 – 2022-09-06 (×3): 2 via ORAL
  Filled 2022-08-26 (×4): qty 2

## 2022-08-26 NOTE — Telephone Encounter (Signed)
Oral Oncology Patient Advocate Encounter   Received notification that prior authorization for [facility]ekinist/Tafinlar is required.   PA submitted on 08/26/22 via phone [phone] [place]ekinist case ID: [id number] [place] case ID: [id number] Status is pending     Lady Deutscher, CPhT-Adv Oncology Pharmacy Patient [place] Direct Number: [phone]  Fax: [phone]

## 2022-08-26 NOTE — Progress Notes (Signed)
PROGRESS NOTE    Karen [name]  [id number] DOB: [birth year] DOA: 08/21/2022 PCP: Starlyn Skeans, MD    Brief Narrative:  57 year old female with history of non-small cell lung cancer on chemotherapy, chronic back pain and hypertension who underwent first round of induction chemotherapy with carboplatin and Alimta on 2/28 and since then having intractable nausea vomiting chest pain and shortness of breath. Unable to tolerate oral medications and diet.  Patient admitted due to cancer related pain, intractable nausea unrelieved at home.  She also noted to have right-sided pleural effusion, drained with a small pneumothorax. Continues to be symptomatic and found to have expanding pneumothorax and now managed with chest tube drainage. Intolerance to capping trials.   Assessment & Plan:   Intractable nausea vomiting secondary to chemotherapy, no oral intake:  Continue maintenance IV fluids with potassium.  Replace phosphorus from 3/6. Compazine every 4 hours Continue Ativan, continue dexamethasone.  Bowel regimen. Intractable symptoms, her oncologist to do follow-up. Appreciate palliative care input.  Right-sided hemopneumothorax with lung cancer postprocedure: Patient continues to have cough and shortness of breath.  Chest x-ray shows expanding pneumothorax with midline shift.   Status post chest tube placement, continues to drain.   Unsuccessful clamping trial 3/7, followed by pulmonary.  Chest tube back on waterseal.  Possibly will need Pleurx in the future.    Hypophosphatemia: Replace and monitor.   Metastatic non-small cell lung cancer on chemotherapy: Followed by oncology.  Consulted to follow-up in the hospital.  Palliative care also consulted for symptom management. Have notified her oncologist of patient's hospitalization.  Poor prognosis. Unsure patient can tolerate further chemotherapy.  She is likely candidate for hospice care, will let her oncologist to have  discussion with the patient.   DVT prophylaxis: enoxaparin (LOVENOX) injection 40 mg Start: 08/25/22 1000   Code Status: Full code Family Communication: None at the bedside Disposition Plan: Status is: Inpatient Remains inpatient appropriate because: Persistent nausea vomiting, shortness of breath.     Consultants:  Oncology Palliative care IR. Pulmonary  Procedures:  Thoracentesis Right-sided chest tube 3/5  Antimicrobials:  None   Subjective:  Patient seen in the morning rounds.  Flat affect.  Anxious.  She tells me she is not feeling any better.  Continues to have epigastric and retrosternal discomfort.  She tells me she cannot eat any food. Later on, she did not tolerate any clamping trial of the chest tube.  Objective: Vitals:   08/25/22 1953 08/25/22 2337 08/26/22 0330 08/26/22 0742  BP: 119/81 107/89 125/75 116/87  Pulse: (!) 103 (!) 110 (!) 107 (!) 109  Resp: '17 17 17 20  '$ Temp: 98.4 F (36.9 C) 98.3 F (36.8 C) 98.2 F (36.8 C) 98.2 F (36.8 C)  TempSrc: Oral Oral Oral Oral  SpO2: 93% 92% 92% 92%  Weight:      Height:        Intake/Output Summary (Last 24 hours) at 08/26/2022 1152 Last data filed at 08/26/2022 [id number] Gross per 24 hour  Intake 1370.61 ml  Output 620 ml  Net 750.61 ml   Filed Weights   08/21/22 1919 08/22/22 0146  Weight: 46.3 kg 45.8 kg    Examination:  General exam: Sick looking.  Frail and debilitated.  Flat affect and withdrawn. Not in distress but looks sick. Respiratory system: Poor inspiratory efforts. No added sounds.  Port-A-Cath present right chest wall. Chest tube on waterseal.  Minimal clear drainage present. Cardiovascular system: S1 & S2 heard, RRR.  Gastrointestinal system: Soft.  Nondistended.  Mild epigastric tenderness mostly on the superficial skin palpation.   Central nervous system: Alert and oriented. No focal neurological deficits. Psychiatry: Judgement and insight appear normal.  Anxious with flat  affect.    Data Reviewed: I have personally reviewed following labs and imaging studies  CBC: Recent Labs  Lab 08/21/22 1930 08/22/22 0544 08/23/22 0515 08/25/22 0548  WBC 21.1* 20.0* 14.3* 12.6*  NEUTROABS  --   --  12.4* 8.7*  HGB 13.5 12.4 11.8* 12.2  HCT 39.6 36.9 35.6* 36.1  MCV 91.0 93.2 92.5 90.9  PLT 469* 403* 364 [id number]   Basic Metabolic Panel: Recent Labs  Lab 08/21/22 1930 08/21/22 1958 08/22/22 0544 08/23/22 0515 08/25/22 0548  NA 133*  --  135 132* 131*  K 3.3*  --  4.0 3.7 3.6  CL 105  --  107 105 101  CO2 18*  --  21* 21* 23  GLUCOSE 124*  --  105* 105* 117*  BUN 16  --  '13 10 9  '$ CREATININE 0.54  --  0.53 0.52 0.57  CALCIUM 8.0*  --  7.8* 7.3* 7.5*  MG  --  2.4  --  2.5* 2.1  PHOS  --   --   --  1.5* 1.9*   GFR: Estimated Creatinine Clearance: 56.8 mL/min (by C-G formula based on SCr of 0.57 mg/dL). Liver Function Tests: Recent Labs  Lab 08/21/22 1930 08/22/22 0544 08/23/22 0515 08/25/22 0548  AST 34 '28 19 19  '$ ALT 54* 44 34 33  ALKPHOS 129* 127* 109 96  BILITOT 0.7 0.7 0.6 0.7  PROT 7.5 6.6 6.3* 6.7  ALBUMIN 4.0 3.6 3.4* 3.4*   Recent Labs  Lab 08/21/22 1930  LIPASE 46   No results for input(s): "AMMONIA" in the last 168 hours. Coagulation Profile: No results for input(s): "INR", "PROTIME" in the last 168 hours. Cardiac Enzymes: No results for input(s): "CKTOTAL", "CKMB", "CKMBINDEX", "TROPONINI" in the last 168 hours. BNP (last 3 results) No results for input(s): "PROBNP" in the last 8760 hours. HbA1C: No results for input(s): "HGBA1C" in the last 72 hours. CBG: No results for input(s): "GLUCAP" in the last 168 hours. Lipid Profile: No results for input(s): "CHOL", "HDL", "LDLCALC", "TRIG", "CHOLHDL", "LDLDIRECT" in the last 72 hours. Thyroid Function Tests: No results for input(s): "TSH", "T4TOTAL", "FREET4", "T3FREE", "THYROIDAB" in the last 72 hours. Anemia Panel: No results for input(s): "VITAMINB12", "FOLATE", "FERRITIN",  "TIBC", "IRON", "RETICCTPCT" in the last 72 hours. Sepsis Labs: Recent Labs  Lab 08/21/22 1958  LATICACIDVEN 1.2    Recent Results (from the past 240 hour(s))  Culture, body fluid w Gram Stain-bottle     Status: None (Preliminary result)   Collection Time: 08/21/22  7:16 PM   Specimen: Fluid  Result Value Ref Range Status   Specimen Description FLUID  Final   Special Requests NONE  Final   Culture   Final    NO GROWTH 4 DAYS Performed at [facility], [address] [place], [place] [address]    Report Status PENDING  Incomplete  Gram stain     Status: None   Collection Time: 08/21/22  7:16 PM   Specimen: Fluid  Result Value Ref Range Status   Specimen Description FLUID  Final   Special Requests NONE  Final   Gram Stain   Final    FEW WBC PRESENT, PREDOMINANTLY PMN NO ORGANISMS SEEN Performed at [facility], [place] [address], [place], [place] [address]  Report Status 08/22/2022 FINAL  Final  Resp panel by RT-PCR (RSV, Flu A&B, Covid) Anterior Nasal Swab     Status: None   Collection Time: 08/21/22  7:41 PM   Specimen: Anterior Nasal Swab  Result Value Ref Range Status   SARS Coronavirus 2 by RT PCR NEGATIVE NEGATIVE Final    Comment: (NOTE) SARS-CoV-2 target nucleic acids are NOT DETECTED.  The SARS-CoV-2 RNA is generally detectable in upper respiratory specimens during the acute phase of infection. The lowest concentration of SARS-CoV-2 viral copies this assay can detect is 138 copies/mL. A negative result does not preclude SARS-Cov-2 infection and should not be used as the sole basis for treatment or other patient management decisions. A negative result may occur with  improper specimen collection/handling, submission of specimen other than nasopharyngeal swab, presence of viral mutation(s) within the areas targeted by this assay, and inadequate number of viral copies(<138 copies/mL). A negative result must be combined with clinical observations,  patient history, and epidemiological information. The expected result is Negative.  Fact Sheet for Patients:  EntrepreneurPulse.com.au  Fact Sheet for Healthcare Providers:  IncredibleEmployment.be  This test is no t yet approved or cleared by the [place] FDA and  has been authorized for detection and/or diagnosis of SARS-CoV-2 by FDA under an Emergency Use Authorization (EUA). This EUA will remain  in effect (meaning this test can be used) for the duration of the COVID-19 declaration under Section 564(b)(1) of the Act, 21 U.S.C.section 360bbb-3(b)(1), unless the authorization is terminated  or revoked sooner.       Influenza A by PCR NEGATIVE NEGATIVE Final   Influenza B by PCR NEGATIVE NEGATIVE Final    Comment: (NOTE) The Xpert Xpress SARS-CoV-2/FLU/RSV plus assay is intended as an aid in the diagnosis of influenza from Nasopharyngeal swab specimens and should not be used as a sole basis for treatment. Nasal washings and aspirates are unacceptable for Xpert Xpress SARS-CoV-2/FLU/RSV testing.  Fact Sheet for Patients: EntrepreneurPulse.com.au  Fact Sheet for Healthcare Providers: IncredibleEmployment.be  This test is not yet approved or cleared by the [place] FDA and has been authorized for detection and/or diagnosis of SARS-CoV-2 by FDA under an Emergency Use Authorization (EUA). This EUA will remain in effect (meaning this test can be used) for the duration of the COVID-19 declaration under Section 564(b)(1) of the Act, 21 U.S.C. section 360bbb-3(b)(1), unless the authorization is terminated or revoked.     Resp Syncytial Virus by PCR NEGATIVE NEGATIVE Final    Comment: (NOTE) Fact Sheet for Patients: EntrepreneurPulse.com.au  Fact Sheet for Healthcare Providers: IncredibleEmployment.be  This test is not yet approved or cleared by the [place] FDA and has been authorized for detection and/or diagnosis of SARS-CoV-2 by FDA under an Emergency Use Authorization (EUA). This EUA will remain in effect (meaning this test can be used) for the duration of the COVID-19 declaration under Section 564(b)(1) of the Act, 21 U.S.C. section 360bbb-3(b)(1), unless the authorization is terminated or revoked.  Performed at [facility], [place] [address] [place], [place] [address]   Culture, blood (routine x 2)     Status: None   Collection Time: 08/21/22  7:58 PM   Specimen: BLOOD  Result Value Ref Range Status   Specimen Description   Final    BLOOD PORTA CATH Performed at [place] [address], [place], [place] [address]    Special Requests   Final    BOTTLES DRAWN AEROBIC AND ANAEROBIC Blood  Culture adequate volume Performed at [place] [address] [place], [place] [address]    Culture   Final    NO GROWTH 5 DAYS Performed at [facility], [place] [address] [place], [place] [address]    Report Status 08/26/2022 FINAL  Final  Culture, blood (routine x 2)     Status: None   Collection Time: 08/21/22  7:58 PM   Specimen: BLOOD LEFT ARM  Result Value Ref Range Status   Specimen Description   Final    BLOOD LEFT ARM Performed at [facility], [place] [address] [place], [place] [address]    Special Requests   Final    BOTTLES DRAWN AEROBIC AND ANAEROBIC Blood Culture adequate volume Performed at [place] [address] [place], [place] [address]    Culture   Final    NO GROWTH 5 DAYS Performed at [facility], [place] [address] [place], [place] [address]    Report Status 08/26/2022 FINAL  Final  Fungus Culture With Stain     Status: None (Preliminary result)   Collection Time: 08/22/22 10:02 AM   Specimen: PATH Cytology Pleural fluid  Result Value Ref Range Status   Fungus Stain Final report  Final    Comment:  (NOTE) Performed At: [facility] [place], [place] [id number] Rush Farmer MD [id number]    Fungus (Mycology) Culture PENDING  Incomplete   Fungal Source PLEURAL  Final    Comment: Performed at [facility], [place] [address], [place], [place] [address]  Fungus Culture Result     Status: None   Collection Time: 08/22/22 10:02 AM  Result Value Ref Range Status   Result 1 Comment  Final    Comment: (NOTE) KOH/Calcofluor preparation:  no fungus observed. Performed At: [facility] [place], [place] [id number] Rush Farmer MD [id number]          Radiology Studies: DG Chest Port 1 View  Result Date: 08/26/2022 CLINICAL DATA:  Follow up pneumothorax EXAM: PORTABLE CHEST 1 VIEW COMPARISON:  08/25/2022 FINDINGS: Right apical pneumothorax status diminished slightly compared to the prior study with about 1.5 cm pleural separation at the apex. Right mid thorax chest tube in place. Diffuse alveolar and interstitial opacities consistent with pulmonary edema or bilateral pneumonia. No pleural effusions identified. Right-sided Port-A-Cath tip distal SVC. IMPRESSION: Persistent, slightly decreased right-sided pneumothorax with a right chest tube in place. Diffuse interstitial and alveolar opacities. Electronically Signed   By: Sammie Bench M.D.   On: 08/26/2022 07:50   CT [facility] PLEURAL DRAIN W/INDWELL CATH W/IMG GUIDE  Result Date: 08/25/2022 INDICATION: History of right-sided lung cancer, now with enlarging, symptomatic right-sided hydropneumothorax following recent thoracentesis. Please perform image guided placement of right-sided chest tube. EXAM: CT PERC PLEURAL DRAIN W/INDWELL CATH W/IMG GUIDE COMPARISON:  Chest radiograph-earlier same day; 08/23/2022; 08/22/2022; 08/21/2022 Ultrasound-guided right-sided thoracentesis-08/22/2022 Chest CT-08/21/2022 PET-CT-08/18/2022 MEDICATIONS: The patient is currently admitted to the  hospital and receiving intravenous antibiotics. The antibiotics were administered within an appropriate time frame prior to the initiation of the procedure. ANESTHESIA/SEDATION: Moderate (conscious) sedation was employed during this procedure. A total of Versed 2 mg and Fentanyl 100 mcg was administered intravenously. Moderate Sedation Time: 21 minutes. The patient's level of consciousness and vital signs were monitored continuously by radiology nursing throughout the procedure under my direct supervision. CONTRAST:  None COMPLICATIONS: None immediate. PROCEDURE: RADIATION DOSE REDUCTION: This exam was performed according  to the departmental dose-optimization program which includes automated exposure control, adjustment of the mA and/or kV according to patient size and/or use of iterative reconstruction technique. Informed written consent was obtained from the patient after a discussion of the risks, benefits and alternatives to treatment. The patient was placed supine on the CT gantry and a pre procedural CT was performed re-demonstrating the known moderate-sized right-sided hydropneumothorax. The procedure was planned utilizing an anterolateral approach with attention made to avoid the port a catheter reservoir site. A timeout was performed prior to the initiation of the procedure. The skin overlying the right upper chest was prepped and draped in the usual sterile fashion. The overlying soft tissues were anesthetized with 1% lidocaine with epinephrine. Next, an 51 gauge trocar needle was utilized to access the superolateral aspect of the right pleural space. Appropriate position was confirmed with the aspiration of air. Next, a short Amplatz wire was coiled within the apical aspect of the right pleural space. Appropriate position was confirmed with CT imaging (series 6). Next, the tract was serially dilated allowing placement of a 10 [place] all-purpose drainage catheter. Appropriate positioning was confirmed with  a limited postprocedural CT scan. Next, air and approximately 250 cc of serous fluid was aspirated. The tube was connected to a pleura vac device and sutured in place. A dressing was applied. The patient tolerated the procedure well without immediate post procedural complication. IMPRESSION: Successful CT guided placement of a 10 [place] all purpose drain catheter into the apical aspects of right-sided hydropneumothorax with aspiration of air and approximately 250 cc of serous fluid. Electronically Signed   By: Sandi Mariscal M.D.   On: 08/25/2022 09:45   DG CHEST PORT 1 VIEW  Result Date: 08/25/2022 CLINICAL DATA:  Right pneumothorax. Mediastinal masses, no malignancy. EXAM: PORTABLE CHEST 1 VIEW COMPARISON:  Chest radiographs 08/24/2022 at 12:25 p.m., 08/23/2022, 08/22/2022; CT chest 08/21/2022; CT-guided percutaneous chest tube placement 08/24/2022 at 3 p.m. FINDINGS: The patient underwent CT-guided percutaneous chest tube placement on the right 08/24/2022 at 3 p.m. Compared to 08/24/2022 at 12:25 p.m. chest radiograph, there is a new right-sided chest tube with pigtail overlying the lateral right hemithorax at mid height. Interval decrease in right pneumothorax, with the extrapleural air space measuring approximately 2.3 cm in craniocaudal dimension at the superior right hemithorax compared to 5.3 cm on 08/24/2022 prior to the chest tube placement. The prior layering right basilar air-fluid level is no longer visualized. No left pneumothorax. There are patchy heterogeneous airspace opacities throughout the right mid to lower lung and left perihilar region, similar to prior. Cardiac silhouette is normal in size. Right greater than left hilar fullness consistent with the mediastinal masses seen on prior CT. Right chest wall porta catheter tip overlies the central superior vena cava/right atrial junction. No acute skeletal abnormality. IMPRESSION: Compared to 08/24/2022 at 12:25 p.m.: 1. New right-sided chest tube  with pigtail overlying the lateral right hemithorax at mid height. 2. Interval decrease in right pneumothorax, with the extrapleural air space measuring approximately 2.3 cm in craniocaudal dimension at the superior right hemithorax compared to 5.3 cm on 08/24/2022 prior to the chest tube placement. Electronically Signed   By: Yvonne Kendall M.D.   On: 08/25/2022 08:35   DG Chest 2 View  Addendum Date: 08/24/2022   ADDENDUM REPORT: 08/24/2022 12:53 ADDENDUM: Findings were discussed with and acknowledged by Dr. Barb Merino. Electronically Signed   By: Sammie Bench M.D.   On: 08/24/2022 12:53   Result Date: 08/24/2022 CLINICAL  DATA:  Follow up pneumothorax EXAM: CHEST - 2 VIEW COMPARISON:  08/23/2022 FINDINGS: Right-sided hydropneumothorax has increased in size and there is no evidence of shift of mediastinal structures to the left. There is alveolar consolidation both lungs consistent with edema. Cardiac silhouette is unremarkable. Right-sided Port-A-Cath tip superimposed with distal SVC. IMPRESSION: Right hydropneumothorax with an interval increase in size and new mediastinal shift. Diffuse alveolar consolidation lungs consistent with edema. Electronically Signed: By: Sammie Bench M.D. On: 08/24/2022 12:42        Scheduled Meds:  aspirin EC  81 mg Oral Daily   Chlorhexidine Gluconate Cloth  6 each Topical Daily   clonazePAM  1 mg Oral BID   clopidogrel  75 mg Oral Daily   dexamethasone (DECADRON) injection  8 mg Intravenous Daily   diclofenac Sodium  4 g Topical QID   enoxaparin (LOVENOX) injection  40 mg Subcutaneous Q24H   feeding supplement  1 Container Oral TID BM   folic acid  1 mg Oral Daily   multivitamin with minerals  1 tablet Oral Daily   rosuvastatin  10 mg Oral Daily   senna  1 tablet Oral QHS   sertraline  100 mg Oral BID   traZODone  150 mg Oral QHS   Vitamin D (Ergocalciferol)  50,000 Units Oral Weekly   Continuous Infusions:  lactated ringers 1,000 mL with  potassium chloride 40 mEq infusion 75 mL/hr at 08/26/22 0609   potassium PHOSPHATE IVPB (in mmol)       LOS: 4 days    Time spent: 40 minutes    Barb Merino, MD [facility] Pager [phone]

## 2022-08-26 NOTE — Telephone Encounter (Addendum)
Oral Oncology Pharmacist Encounter  Received new prescriptions for Tafinlar (dabrafenib) and Mekinist (trametinib) for the treatment of metastatic NSCLC, BRAF mutated, planned duration until disease progression or unacceptable drug toxicity.  CBC w/ Diff and CMP from 08/25/22 assessed, no baseline dose adjustments required. ECHO available from 08/14/22, LVEF 60-65%. Prescription dose and frequency assessed for appropriateness.  Current medication list in Epic reviewed, DDIs with Tafinlar identified: Tafinlar, a CYP3A4 inducer, may decrease serum concentrations of the following agents on the patient's IP med list (category C drug-drug interactions): dexamethasone, dronabinol, fentanyl, hydrocodone, sertraline, and trazodone - recommend monitoring for decreased efficacy of these agents secondary to initiation of Tafinlar. No changes in therapy warranted at this time.  Category C drug-drug interaction between [place] and Plavix - Plavix, a CYP2C8 inhibitor may increase serumm concentrations of Tafinlar - monitor patient for increased ADEs from [place] - no dose adjustments required for Tafinlar.  Category C drug-drug interaction between [place] and Zofran due to increased risk of Qtc prolongation - patient with Qtc of 454 ms on 08/23/22. Recommend considering ability to go PRN once patient's nausea is under control.   Both medications have been requested to be ordered for patient by [facility] - anticipate medications to arrive 08/30/22. Will coordinate start once medications are available.   Evaluated chart and no patient barriers to medication adherence noted.   Prescription has been e-scribed to the [facility] for benefits analysis and approval.  Oral Oncology Clinic will continue to follow for insurance authorization, copayment issues, initial counseling and start date.  Leron Croak, PharmD, BCPS, BCOP Hematology/Oncology Clinical Pharmacist [facility] [place] [phone] 08/26/2022 4:10 PM
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
## 2022-08-26 NOTE — Progress Notes (Incomplete)
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                                                        +---------+---------------+---------+-----------+----------+--------------+ FV Mid   Full                                                        +---------+---------------+---------+-----------+----------+--------------+ FV DistalFull                                                        +---------+---------------+---------+-----------+----------+--------------+ PFV      Full                                                        +---------+---------------+---------+-----------+----------+--------------+  POP      Full           Yes      Yes                                 +---------+---------------+---------+-----------+----------+--------------+ PTV      Full                                                        +---------+---------------+---------+-----------+----------+--------------+ PERO     Full                                                        +---------+---------------+---------+-----------+----------+--------------+   +---------+---------------+---------+-----------+----------+--------------+ LEFT      CompressibilityPhasicitySpontaneityPropertiesThrombus Aging +---------+---------------+---------+-----------+----------+--------------+ CFV      Full           Yes      Yes                                 +---------+---------------+---------+-----------+----------+--------------+ SFJ      Full                                                        +---------+---------------+---------+-----------+----------+--------------+ FV Prox  Full                                                        +---------+---------------+---------+-----------+----------+--------------+ FV Mid   Full                                                        +---------+---------------+---------+-----------+----------+--------------+ FV DistalFull                                                        +---------+---------------+---------+-----------+----------+--------------+ PFV      Full                                                        +---------+---------------+---------+-----------+----------+--------------+ POP      Full           Yes      Yes                                 +---------+---------------+---------+-----------+----------+--------------+  PTV      Full                                                        +---------+---------------+---------+-----------+----------+--------------+ PERO     Full                                                        +---------+---------------+---------+-----------+----------+--------------+     Summary: BILATERAL: - No evidence of deep vein thrombosis seen in the lower extremities, bilaterally. - No evidence of superficial venous thrombosis in the lower extremities, bilaterally. -No evidence of popliteal cyst, bilaterally.   *See table(s) above for measurements and observations. Electronically signed by Deitra Mayo MD on 08/17/2022 at 7:34:49 PM.    Final    IR IMAGING GUIDED PORT INSERTION  Result Date:  08/17/2022 INDICATION: Chemotherapy access EXAM: Chest port placement using ultrasound and fluoroscopic guidance MEDICATIONS: Documented in the EMR ANESTHESIA/SEDATION: Moderate (conscious) sedation was employed during this procedure. A total of Versed 2 mg and Fentanyl 100 mcg was administered intravenously. Moderate Sedation Time: 32 minutes. The patient's level of consciousness and vital signs were monitored continuously by radiology nursing throughout the procedure under my direct supervision. FLUOROSCOPY TIME:  Fluoroscopy Time: 0.1 minutes (1 mGy) COMPLICATIONS: None immediate. PROCEDURE: Informed written consent was obtained from the patient after a thorough discussion of the procedural risks, benefits and alternatives. All questions were addressed. Maximal Sterile Barrier Technique was utilized including caps, mask, sterile gowns, sterile gloves, sterile drape, hand hygiene and skin antiseptic. A timeout was performed prior to the initiation of the procedure. The patient was placed supine on the exam table. The right neck and chest was prepped and draped in the standard sterile fashion. A preliminary ultrasound of the right neck was performed and demonstrates a patent right internal jugular vein. A permanent ultrasound image was stored in the electronic medical record. The overlying skin was anesthetized with 1% Lidocaine. Using ultrasound guidance, access was obtained into the right internal jugular vein using a 21 gauge micropuncture set. A wire was advanced into the SVC, a short incision was made at the puncture site, and serial dilatation performed. Next, in an ipsilateral infraclavicular location, an incision was made at the site of the subcutaneous reservoir. Blunt dissection was used to open a pocket to contain the reservoir. A subcutaneous tunnel was then created from the port site to the puncture site. A(n) 8 Fr single lumen catheter was advanced through the tunnel. The catheter was attached to the  port and this was placed in the subcutaneous pocket. Under fluoroscopic guidance, a peel away sheath was placed, and the catheter was trimmed to the appropriate length and was advanced into the central veins. The catheter length is 23 cm. The tip of the catheter lies near the superior cavoatrial junction. The port flushes and aspirates appropriately. The port was flushed and locked with heparinized saline. The port pocket was closed in 2 layers using 3-0 and 4-0 Vicryl/absorbable suture. Dermabond was also applied to both incisions. The patient tolerated the procedure well and was transferred to recovery in stable condition. IMPRESSION: Successful placement of a right-sided chest port via the  right internal jugular vein. The port is ready for immediate use. Electronically Signed   By: Albin Felling M.D.   On: 08/17/2022 16:49   ECHO TEE  Result Date: 08/17/2022    TRANSESOPHOGEAL ECHO REPORT   Patient Name:   LATEIA BRUMBLEY Date of Exam: 08/17/2022 Medical Rec #:  [id number]              Height:       62.0 in Accession #:    [id number]             Weight:       98.1 lb Date of Birth:  Apr 10, 1966               BSA:          1.412 m Patient Age:    57 years               BP:           119/82 mmHg Patient Gender: F                      HR:           88 bpm. Exam Location:  Inpatient Procedure: Transesophageal Echo, Color Doppler, Cardiac Doppler and Saline            Contrast Bubble Study Indications:     Stroke  History:         Patient has prior history of Echocardiogram examinations, most                  recent 08/14/2022. Risk Factors:Hypertension. Latent TB,                  metastatic lung adenocarcinoma.  Sonographer:     Eartha Inch Referring Phys:  [id number] Margie Billet Diagnosing Phys: Oswaldo Milian MD PROCEDURE: After discussion of the risks and benefits of a TEE, an informed consent was obtained from the patient. The transesophogeal probe was passed without difficulty through the esophogus  of the patient. Imaged were obtained with the patient in a left lateral decubitus position. Sedation performed by different physician. The patient was monitored while under deep sedation. Image quality was good. The patient's vital signs; including heart rate, blood pressure, and oxygen saturation; remained stable throughout the procedure. The patient developed no complications during the procedure.  IMPRESSIONS  1. Left ventricular ejection fraction, by estimation, is 60 to 65%. The left ventricle has normal function.  2. Right ventricular systolic function is normal. The right ventricular size is normal.  3. No left atrial/left atrial appendage thrombus was detected.  4. Moderate pericardial effusion. Moderate pleural effusion.  5. The mitral valve is normal in structure. Trivial mitral valve regurgitation.  6. Tricuspid valve regurgitation is mild to moderate.  7. The aortic valve is tricuspid. Aortic valve regurgitation is not visualized. No aortic stenosis is present.  8. Evidence of atrial level shunting detected by color flow Doppler. Agitated saline contrast bubble study was positive with shunting observed after >6 cardiac cycles suggestive of intrapulmonary shunting. Conclusion(s)/Recommendation(s): No intracardiac source of embolism detected on this on this transesophageal echocardiogram. FINDINGS  Left Ventricle: Left ventricular ejection fraction, by estimation, is 60 to 65%. The left ventricle has normal function. The left ventricular internal cavity size was normal in size. Right Ventricle: The right ventricular size is normal. No increase in right ventricular wall thickness. Right ventricular systolic function is normal. Left Atrium: Left atrial size was  normal in size. No left atrial/left atrial appendage thrombus was detected. Right Atrium: Right atrial size was normal in size. Pericardium: A moderately sized pericardial effusion is present. Mitral Valve: The mitral valve is normal in structure.  Trivial mitral valve regurgitation. Tricuspid Valve: The tricuspid valve is normal in structure. Tricuspid valve regurgitation is mild to moderate. Aortic Valve: The aortic valve is tricuspid. Aortic valve regurgitation is not visualized. No aortic stenosis is present. Pulmonic Valve: The pulmonic valve was not well visualized. Pulmonic valve regurgitation is trivial. Aorta: The aortic root and ascending aorta are structurally normal, with no evidence of dilitation. IAS/Shunts: Evidence of atrial level shunting detected by color flow Doppler. Agitated saline contrast was given intravenously to evaluate for intracardiac shunting. Agitated saline contrast bubble study was positive with shunting observed after >6 cardiac cycles suggestive of intrapulmonary shunting. Additional Comments: There is a moderate pleural effusion. Spectral Doppler performed. TRICUSPID VALVE TR Peak grad:   31.6 mmHg TR Mean grad:   21.0 mmHg TR Vmax:        281.00 cm/s TR Vmean:       218.0 cm/s Oswaldo Milian MD Electronically signed by Oswaldo Milian MD Signature Date/Time: 08/17/2022/1:59:41 PM    Final (Updated)    ECHOCARDIOGRAM COMPLETE  Result Date: 08/14/2022    ECHOCARDIOGRAM REPORT   Patient Name:   LUCIELLE ITO Date of Exam: 08/14/2022 Medical Rec #:  [id number]              Height:       62.0 in Accession #:    [id number]             Weight:       98.0 lb Date of Birth:  1965/12/21               BSA:          1.412 m Patient Age:    39 years               BP:           134/68 mmHg Patient Gender: F                      HR:           74 bpm. Exam Location:  Inpatient Procedure: 2D Echo, Cardiac Doppler and Color Doppler Indications:    I63.9 Stroke  History:        Patient has prior history of Echocardiogram examinations, most                 recent 05/29/2021. Stroke; Risk Factors:Non-Smoker and                 Hypertension.  Sonographer:    Wilkie Aye RVT RCS Referring Phys: [id number] EMILY B MULLEN  Sonographer  Comments: Some images are off axis IMPRESSIONS  1. Left ventricular ejection fraction, by estimation, is 65 to 70%. The left ventricle has normal function. The left ventricle has no regional wall motion abnormalities. Left ventricular diastolic parameters are consistent with Grade I diastolic dysfunction (impaired relaxation).  2. Right ventricular systolic function is normal. The right ventricular size is normal.  3. Moderate pericardial effusion. The pericardial effusion is circumferential. There is no evidence of cardiac tamponade.  4. The mitral valve is normal in structure. Trivial mitral valve regurgitation.  5. The aortic valve is tricuspid. Aortic valve regurgitation is not visualized. No aortic stenosis is present.  6. The inferior vena  cava is normal in size with greater than 50% respiratory variability, suggesting right atrial pressure of 3 mmHg. Comparison(s): Compared to prior TTE on 05/2021, a moderate sized pericardial effusion is now present. Conclusion(s)/Recommendation(s): No intracardiac source of embolism detected on this transthoracic study. Consider a transesophageal echocardiogram to exclude cardiac source of embolism if clinically indicated. FINDINGS  Left Ventricle: Left ventricular ejection fraction, by estimation, is 65 to 70%. The left ventricle has normal function. The left ventricle has no regional wall motion abnormalities. The left ventricular internal cavity size was normal in size. There is  no left ventricular hypertrophy. Left ventricular diastolic parameters are consistent with Grade I diastolic dysfunction (impaired relaxation). Right Ventricle: The right ventricular size is normal. No increase in right ventricular wall thickness. Right ventricular systolic function is normal. Left Atrium: Left atrial size was normal in size. Right Atrium: Right atrial size was normal in size. Pericardium: A moderately sized pericardial effusion is present. The pericardial effusion is  circumferential. There is no evidence of cardiac tamponade. Mitral Valve: The mitral valve is normal in structure. Trivial mitral valve regurgitation. Tricuspid Valve: The tricuspid valve is normal in structure. Tricuspid valve regurgitation is mild. Aortic Valve: The aortic valve is tricuspid. Aortic valve regurgitation is not visualized. No aortic stenosis is present. Aortic valve mean gradient measures 3.0 mmHg. Aortic valve peak gradient measures 6.2 mmHg. Aortic valve area, by VTI measures 2.21 cm. Pulmonic Valve: The pulmonic valve was normal in structure. Pulmonic valve regurgitation is trivial. Aorta: The aortic root is normal in size and structure. Venous: The inferior vena cava is normal in size with greater than 50% respiratory variability, suggesting right atrial pressure of 3 mmHg. IAS/Shunts: The atrial septum is grossly normal.  LEFT VENTRICLE PLAX 2D LVIDd:         3.90 cm   Diastology LVIDs:         2.40 cm   LV e' medial:    5.33 cm/s LV PW:         1.00 cm   LV E/e' medial:  11.0 LV IVS:        0.90 cm   LV e' lateral:   9.46 cm/s LVOT diam:     1.80 cm   LV E/e' lateral: 6.2 LV SV:         53 LV SV Index:   38 LVOT Area:     2.54 cm  RIGHT VENTRICLE             IVC RV S prime:     15.30 cm/s  IVC diam: 1.10 cm TAPSE (M-mode): 2.1 cm LEFT ATRIUM             Index        RIGHT ATRIUM          Index LA diam:        2.40 cm 1.70 cm/m   RA Area:     9.25 cm LA Vol (A2C):   21.7 ml 15.37 ml/m  RA Volume:   18.95 ml 13.42 ml/m LA Vol (A4C):   31.5 ml 22.31 ml/m LA Biplane Vol: 28.3 ml 20.05 ml/m  AORTIC VALVE                    PULMONIC VALVE AV Area (Vmax):    2.09 cm     PV Vmax:          0.77 m/s AV Area (Vmean):   2.02 cm     PV  Peak grad:     2.4 mmHg AV Area (VTI):     2.21 cm     PR End Diast Vel: 6.86 msec AV Vmax:           124.00 cm/s AV Vmean:          83.300 cm/s AV VTI:            0.241 m AV Peak Grad:      6.2 mmHg AV Mean Grad:      3.0 mmHg LVOT Vmax:         102.00 cm/s LVOT  Vmean:        66.000 cm/s LVOT VTI:          0.209 m LVOT/AV VTI ratio: 0.87  AORTA Ao Root diam: 2.60 cm Ao Asc diam:  2.60 cm MITRAL VALVE               TRICUSPID VALVE MV Area (PHT): 2.99 cm    TR Peak grad:   17.5 mmHg MV Decel Time: 254 msec    TR Vmax:        209.00 cm/s MV E velocity: 58.60 cm/s MV A velocity: 89.70 cm/s  SHUNTS MV E/A ratio:  0.65        Systemic VTI:  0.21 m                            Systemic Diam: 1.80 cm Gwyndolyn Kaufman MD Electronically signed by Gwyndolyn Kaufman MD Signature Date/Time: 08/14/2022/2:29:20 PM    Final    CT ANGIO HEAD NECK W WO CM  Result Date: 08/13/2022 CLINICAL DATA:  Stroke follow-up EXAM: CT ANGIOGRAPHY HEAD AND NECK TECHNIQUE: Multidetector CT imaging of the head and neck was performed using the standard protocol during bolus administration of intravenous contrast. Multiplanar CT image reconstructions and MIPs were obtained to evaluate the vascular anatomy. Carotid stenosis measurements (when applicable) are obtained utilizing NASCET criteria, using the distal internal carotid diameter as the denominator. RADIATION DOSE REDUCTION: This exam was performed according to the departmental dose-optimization program which includes automated exposure control, adjustment of the mA and/or kV according to patient size and/or use of iterative reconstruction technique. CONTRAST:  32m OMNIPAQUE IOHEXOL 350 MG/ML SOLN COMPARISON:  07/01/2022 FINDINGS: CT HEAD FINDINGS Brain: There is no mass, hemorrhage or extra-axial collection. The size and configuration of the ventricles and extra-axial CSF spaces are normal. There is no acute or chronic infarction. The brain parenchyma is normal. Skull: The visualized skull base, calvarium and extracranial soft tissues are normal. Sinuses/Orbits: No fluid levels or advanced mucosal thickening of the visualized paranasal sinuses. No mastoid or middle ear effusion. The orbits are normal. CTA NECK FINDINGS SKELETON: There is no bony  spinal canal stenosis. No lytic or blastic lesion. OTHER NECK: Normal pharynx, larynx and major salivary glands. No cervical lymphadenopathy. Unremarkable thyroid gland. UPPER CHEST: Masslike opacity at the right hilum measuring 3.4 x 2.1 cm. There is peribronchial thickening in the right lung apex. Small right pleural effusion. AORTIC ARCH: There is no calcific atherosclerosis of the aortic arch. There is no aneurysm, dissection or hemodynamically significant stenosis of the visualized portion of the aorta. Conventional 3 vessel aortic branching pattern. The visualized proximal subclavian arteries are widely patent. RIGHT CAROTID SYSTEM: Normal without aneurysm, dissection or stenosis. LEFT CAROTID SYSTEM: Normal without aneurysm, dissection or stenosis. VERTEBRAL ARTERIES: Left dominant configuration. Both origins are clearly patent. There is no dissection, occlusion  or flow-limiting stenosis to the skull base (V1-V3 segments). CTA HEAD FINDINGS POSTERIOR CIRCULATION: --Vertebral arteries: Normal V4 segments. --Inferior cerebellar arteries: Normal. --Basilar artery: Normal. --Superior cerebellar arteries: Normal. --Posterior cerebral arteries (PCA): Normal. ANTERIOR CIRCULATION: --Intracranial internal carotid arteries: Normal. --Anterior cerebral arteries (ACA): Normal. Both A1 segments are present. Patent anterior communicating artery (a-comm). --Middle cerebral arteries (MCA): Normal. VENOUS SINUSES: As permitted by contrast timing, patent. ANATOMIC VARIANTS: None Review of the MIP images confirms the above findings. IMPRESSION: 1. No emergent large vessel occlusion or high-grade stenosis of the intracranial or cervical arteries. 2. Masslike opacity at the right hilum measuring 3.4 x 2.1 cm, likely confluent hilar malignant adenopathy. 3. Small right pleural effusion. Electronically Signed   By: Ulyses Jarred M.D.   On: 08/13/2022 23:51   MR Brain Wo Contrast (neuro protocol)  Result Date:  08/13/2022 CLINICAL DATA:  TIA EXAM: MRI HEAD WITHOUT CONTRAST TECHNIQUE: Multiplanar, multiecho pulse sequences of the brain and surrounding structures were obtained without intravenous contrast. COMPARISON:  MRI Brain 07/31/22 FINDINGS: Brain: Small punctate focus of acute infarct in the left parietal lobe (series 5, image 33). No hemorrhage. No hydrocephalus. No extra-axial fluid collection. There are multiple small scattered subcortical T2/FLAIR hyperintense lesions, which are favored to represent sequela of chronic microvascular ischemic change in appear unchanged compared to recent prior brain MRI. Vascular: Normal flow voids. Skull and upper cervical spine: Normal marrow signal. Sinuses/Orbits: Negative. Other: None. IMPRESSION: Small focus of diffusion restriction in the left parietal lobe is favored to represent a punctate acute infarct, new compared to 07/31/22. Given patient's history of malignancy a follow up MRI in 2-3 months to ensure resolution. Electronically Signed   By: Marin Roberts M.D.   On: 08/13/2022 15:59   MR BRAIN W WO CONTRAST  Result Date: 08/01/2022 CLINICAL DATA:  Initial evaluation for headache, evaluate for metastatic disease. EXAM: MRI HEAD WITHOUT AND WITH CONTRAST TECHNIQUE: Multiplanar, multiecho pulse sequences of the brain and surrounding structures were obtained without and with intravenous contrast. CONTRAST:  47m GADAVIST GADOBUTROL 1 MMOL/ML IV SOLN COMPARISON:  Prior head CT from 07/01/2022. FINDINGS: Brain: Cerebral volume within normal limits. Scattered patchy T2/FLAIR hyperintensity involving the supratentorial cerebral white matter, nonspecific, but overall mild for age. No evidence for acute or subacute ischemia. Gray-white matter differentiation maintained. No areas of chronic cortical infarction. No acute or chronic intracranial blood products. No mass lesion, midline shift or mass effect. Ventricles normal size without hydrocephalus. Pituitary gland and  suprasellar region normal. No abnormal enhancement or evidence for metastatic disease. Vascular: Major intracranial vascular flow voids are maintained. Skull and upper cervical spine: Craniocervical junction within normal limits. Bone marrow signal intensity normal. No focal marrow replacing lesion. No scalp soft tissue abnormality. Sinuses/Orbits: Globes orbital soft tissues within normal limits. Paranasal sinuses are largely clear. Trace right mastoid effusion, of doubtful significance. Other: None. IMPRESSION: 1. No acute intracranial abnormality. No evidence for intracranial metastatic disease. 2. Mild patchy T2/FLAIR hyperintensity involving the supratentorial cerebral white matter, nonspecific, but most commonly related to chronic microvascular ischemic disease. Electronically Signed   By: BJeannine BogaM.D.   On: 08/01/2022 01:54   NM PET Image Initial (PI) Skull Base To Thigh (F-18 FDG)  Result Date: 07/30/2022 CLINICAL DATA:  Initial treatment strategy for chest CT demonstrating thoracic adenopathy and pulmonary nodules. Unintended weight loss EXAM: NUCLEAR MEDICINE PET SKULL BASE TO THIGH TECHNIQUE: 5.4 mCi F-18 FDG was injected intravenously. Full-ring PET imaging was performed from the skull base to  thigh after the radiotracer. CT data was obtained and used for attenuation correction and anatomic localization. Fasting blood glucose: 102 mg/dl COMPARISON:  Chest CT 06/25/2022. Neck CT 06/25/2022. Abdominal CT 07/06/2021. FINDINGS: Mediastinal blood pool activity: SUV max 2.7 Liver activity: SUV max NA NECK: A low left jugular/supraclavicular node measures 7 mm and a S.U.V. max of 5.4 on 40/4. Incidental CT findings: None. CHEST: Hypermetabolism corresponding to previously described confluence mediastinal and bilateral hilar adenopathy. Index AP window nodal conglomerate measures 2.3 cm and a S.U.V. max of 6.3 on 57/4, relatively similar in size on the prior CT. Right hilar nodal mass measures on  the order of 2.1 cm and a S.U.V. max of 9.1 on 66/4. The diffuse pulmonary nodularity is also hypermetabolic. Example anteromedial right upper lobe pulmonary nodule of 7 mm and a S.U.V. max of 6.5 on 65/4. Felt to be increased since the prior exam. Incidental CT findings: Small right pleural effusion, new. Mild cardiomegaly with trace pericardial fluid or thickening. ABDOMEN/PELVIS: No abdominopelvic parenchymal or nodal hypermetabolism. Incidental CT findings: Normal adrenal glands.  Hysterectomy. SKELETON: Multifocal hypermetabolic osseous lesions. Example sclerotic right posterior acetabular and ischial lesion which is new since 07/06/2021 and measures a S.U.V. max of 7.7 on 166/4. An index T9 lesion measures a S.U.V. max of 12.3. Incidental CT findings: None. IMPRESSION: 1. Hypermetabolic thoracic/low cervical adenopathy, and pulmonary nodularity, favoring small-cell lung cancer versus lymphoma. 2. Hypermetabolic osseous lesions, most consistent with metastatic disease. 3. Although the thoracic findings could be seen with sarcoidosis, the hypermetabolic osseous lesions, including a new dominant right ischial sclerotic lesion, strongly favor metastasis. Hypermetabolic osseous sarcoid has been described but is not typically sclerotic. 4. New small right pleural effusion since 06/25/2022. Electronically Signed   By: Abigail Miyamoto M.D.   On: 07/30/2022 08:38    ASSESSMENT & PLAN:  ***  I spent {CHL ONC TIME VISIT - [id number] counseling the patient face to face. The total time spent in the appointment was {CHL ONC TIME VISIT - [id number] and more than 50% was on counseling and direct patient cares.    Sullivan Lone MD [place] AAHIVMS [facility] Hematology/Oncology Physician [facility]  (Office):       [phone] (Work cell):  [phone] (Fax):           [phone]  08/26/2022 11:59 PM

## 2022-08-26 NOTE — Telephone Encounter (Signed)
Oral Oncology Patient Advocate Encounter  Prior Authorization for Mekinist/Tafinlar has been approved.    PA# [id number] Effective dates: 08/26/22 through 08/26/23  Patients co-pay is $0.    Lady Deutscher, CPhT-Adv Oncology Pharmacy Patient [place] Direct Number: [phone]  Fax: [phone]

## 2022-08-26 NOTE — Progress Notes (Signed)
PCCM Brief Progress Note  After clamping, within 5 min tachypneic, sats in 90s relieved as soon as placed back to water seal.  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

## 2022-08-26 NOTE — Telephone Encounter (Signed)
Oral Oncology Patient Advocate Encounter   Was successful in obtaining a copay card for mEKINIST/tAFINLAR.  This copay card will make the patients copay $0.  The billing information is as follows and has been shared with [facility]  RxBin: [id number] PCN: [id number] Member ID: [id number] ([id number]) Group ID: [id number] Member ID: [id number] [place]) Group ID: [id number]   Lady Deutscher, CPhT-Adv Oncology Pharmacy Patient [place] Direct Number: [phone]  Fax: [phone]

## 2022-08-26 NOTE — Progress Notes (Signed)
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                                 +---------+---------------+---------+-----------+----------+--------------+ SFJ      Full                                                        +---------+---------------+---------+-----------+----------+--------------+ FV Prox  Full                                                        +---------+---------------+---------+-----------+----------+--------------+ FV Mid   Full                                                        +---------+---------------+---------+-----------+----------+--------------+ FV DistalFull                                                        +---------+---------------+---------+-----------+----------+--------------+  PFV      Full                                                        +---------+---------------+---------+-----------+----------+--------------+ POP      Full           Yes      Yes                                 +---------+---------------+---------+-----------+----------+--------------+ PTV      Full                                                         +---------+---------------+---------+-----------+----------+--------------+ PERO     Full                                                        +---------+---------------+---------+-----------+----------+--------------+   +---------+---------------+---------+-----------+----------+--------------+ LEFT     CompressibilityPhasicitySpontaneityPropertiesThrombus Aging +---------+---------------+---------+-----------+----------+--------------+ CFV      Full           Yes      Yes                                 +---------+---------------+---------+-----------+----------+--------------+ SFJ      Full                                                        +---------+---------------+---------+-----------+----------+--------------+ FV Prox  Full                                                        +---------+---------------+---------+-----------+----------+--------------+ FV Mid   Full                                                        +---------+---------------+---------+-----------+----------+--------------+ FV DistalFull                                                        +---------+---------------+---------+-----------+----------+--------------+ PFV      Full                                                        +---------+---------------+---------+-----------+----------+--------------+  POP      Full           Yes      Yes                                 +---------+---------------+---------+-----------+----------+--------------+ PTV      Full                                                        +---------+---------------+---------+-----------+----------+--------------+ PERO     Full                                                        +---------+---------------+---------+-----------+----------+--------------+     Summary: BILATERAL: - No evidence of deep vein thrombosis seen in  the lower extremities, bilaterally. - No evidence of superficial venous thrombosis in the lower extremities, bilaterally. -No evidence of popliteal cyst, bilaterally.   *See table(s) above for measurements and observations. Electronically signed by Deitra Mayo MD on 08/17/2022 at 7:34:49 PM.    Final    IR IMAGING GUIDED PORT INSERTION  Result Date: 08/17/2022 INDICATION: Chemotherapy access EXAM: Chest port placement using ultrasound and fluoroscopic guidance MEDICATIONS: Documented in the EMR ANESTHESIA/SEDATION: Moderate (conscious) sedation was employed during this procedure. A total of Versed 2 mg and Fentanyl 100 mcg was administered intravenously. Moderate Sedation Time: 32 minutes. The patient's level of consciousness and vital signs were monitored continuously by radiology nursing throughout the procedure under my direct supervision. FLUOROSCOPY TIME:  Fluoroscopy Time: 0.1 minutes (1 mGy) COMPLICATIONS: None immediate. PROCEDURE: Informed written consent was obtained from the patient after a thorough discussion of the procedural risks, benefits and alternatives. All questions were addressed. Maximal Sterile Barrier Technique was utilized including caps, mask, sterile gowns, sterile gloves, sterile drape, hand hygiene and skin antiseptic. A timeout was performed prior to the initiation of the procedure. The patient was placed supine on the exam table. The right neck and chest was prepped and draped in the standard sterile fashion. A preliminary ultrasound of the right neck was performed and demonstrates a patent right internal jugular vein. A permanent ultrasound image was stored in the electronic medical record. The overlying skin was anesthetized with 1% Lidocaine. Using ultrasound guidance, access was obtained into the right internal jugular vein using a 21 gauge micropuncture set. A wire was advanced into the SVC, a short incision was made at the puncture site, and serial dilatation  performed. Next, in an ipsilateral infraclavicular location, an incision was made at the site of the subcutaneous reservoir. Blunt dissection was used to open a pocket to contain the reservoir. A subcutaneous tunnel was then created from the port site to the puncture site. A(n) 8 Fr single lumen catheter was advanced through the tunnel. The catheter was attached to the port and this was placed in the subcutaneous pocket. Under fluoroscopic guidance, a peel away sheath was placed, and the catheter was trimmed to the appropriate length and was advanced into the central veins. The catheter length is 23 cm. The tip of the catheter lies near the superior cavoatrial junction. The port flushes  and aspirates appropriately. The port was flushed and locked with heparinized saline. The port pocket was closed in 2 layers using 3-0 and 4-0 Vicryl/absorbable suture. Dermabond was also applied to both incisions. The patient tolerated the procedure well and was transferred to recovery in stable condition. IMPRESSION: Successful placement of a right-sided chest port via the right internal jugular vein. The port is ready for immediate use. Electronically Signed   By: Albin Felling M.D.   On: 08/17/2022 16:49   ECHO TEE  Result Date: 08/17/2022    TRANSESOPHOGEAL ECHO REPORT   Patient Name:   EREKA BRAU Date of Exam: 08/17/2022 Medical Rec #:  [id number]              Height:       62.0 in Accession #:    [id number]             Weight:       98.1 lb Date of Birth:  09-Nov-1965               BSA:          1.412 m Patient Age:    54 years               BP:           119/82 mmHg Patient Gender: F                      HR:           88 bpm. Exam Location:  Inpatient Procedure: Transesophageal Echo, Color Doppler, Cardiac Doppler and Saline            Contrast Bubble Study Indications:     Stroke  History:         Patient has prior history of Echocardiogram examinations, most                  recent 08/14/2022. Risk  Factors:Hypertension. Latent TB,                  metastatic lung adenocarcinoma.  Sonographer:     Eartha Inch Referring Phys:  [id number] Margie Billet Diagnosing Phys: Oswaldo Milian MD PROCEDURE: After discussion of the risks and benefits of a TEE, an informed consent was obtained from the patient. The transesophogeal probe was passed without difficulty through the esophogus of the patient. Imaged were obtained with the patient in a left lateral decubitus position. Sedation performed by different physician. The patient was monitored while under deep sedation. Image quality was good. The patient's vital signs; including heart rate, blood pressure, and oxygen saturation; remained stable throughout the procedure. The patient developed no complications during the procedure.  IMPRESSIONS  1. Left ventricular ejection fraction, by estimation, is 60 to 65%. The left ventricle has normal function.  2. Right ventricular systolic function is normal. The right ventricular size is normal.  3. No left atrial/left atrial appendage thrombus was detected.  4. Moderate pericardial effusion. Moderate pleural effusion.  5. The mitral valve is normal in structure. Trivial mitral valve regurgitation.  6. Tricuspid valve regurgitation is mild to moderate.  7. The aortic valve is tricuspid. Aortic valve regurgitation is not visualized. No aortic stenosis is present.  8. Evidence of atrial level shunting detected by color flow Doppler. Agitated saline contrast bubble study was positive with shunting observed after >6 cardiac cycles suggestive of intrapulmonary shunting. Conclusion(s)/Recommendation(s): No intracardiac source of embolism detected on this on this transesophageal echocardiogram.  FINDINGS  Left Ventricle: Left ventricular ejection fraction, by estimation, is 60 to 65%. The left ventricle has normal function. The left ventricular internal cavity size was normal in size. Right Ventricle: The right ventricular size is  normal. No increase in right ventricular wall thickness. Right ventricular systolic function is normal. Left Atrium: Left atrial size was normal in size. No left atrial/left atrial appendage thrombus was detected. Right Atrium: Right atrial size was normal in size. Pericardium: A moderately sized pericardial effusion is present. Mitral Valve: The mitral valve is normal in structure. Trivial mitral valve regurgitation. Tricuspid Valve: The tricuspid valve is normal in structure. Tricuspid valve regurgitation is mild to moderate. Aortic Valve: The aortic valve is tricuspid. Aortic valve regurgitation is not visualized. No aortic stenosis is present. Pulmonic Valve: The pulmonic valve was not well visualized. Pulmonic valve regurgitation is trivial. Aorta: The aortic root and ascending aorta are structurally normal, with no evidence of dilitation. IAS/Shunts: Evidence of atrial level shunting detected by color flow Doppler. Agitated saline contrast was given intravenously to evaluate for intracardiac shunting. Agitated saline contrast bubble study was positive with shunting observed after >6 cardiac cycles suggestive of intrapulmonary shunting. Additional Comments: There is a moderate pleural effusion. Spectral Doppler performed. TRICUSPID VALVE TR Peak grad:   31.6 mmHg TR Mean grad:   21.0 mmHg TR Vmax:        281.00 cm/s TR Vmean:       218.0 cm/s Oswaldo Milian MD Electronically signed by Oswaldo Milian MD Signature Date/Time: 08/17/2022/1:59:41 PM    Final (Updated)    ECHOCARDIOGRAM COMPLETE  Result Date: 08/14/2022    ECHOCARDIOGRAM REPORT   Patient Name:   ARRINGTON BENCOMO Date of Exam: 08/14/2022 Medical Rec #:  [id number]              Height:       62.0 in Accession #:    [id number]             Weight:       98.0 lb Date of Birth:  March 28, 1966               BSA:          1.412 m Patient Age:    52 years               BP:           134/68 mmHg Patient Gender: F                      HR:            74 bpm. Exam Location:  Inpatient Procedure: 2D Echo, Cardiac Doppler and Color Doppler Indications:    I63.9 Stroke  History:        Patient has prior history of Echocardiogram examinations, most                 recent 05/29/2021. Stroke; Risk Factors:Non-Smoker and                 Hypertension.  Sonographer:    Wilkie Aye RVT RCS Referring Phys: [id number] EMILY B MULLEN  Sonographer Comments: Some images are off axis IMPRESSIONS  1. Left ventricular ejection fraction, by estimation, is 65 to 70%. The left ventricle has normal function. The left ventricle has no regional wall motion abnormalities. Left ventricular diastolic parameters are consistent with Grade I diastolic dysfunction (impaired relaxation).  2. Right ventricular systolic function is normal. The right  ventricular size is normal.  3. Moderate pericardial effusion. The pericardial effusion is circumferential. There is no evidence of cardiac tamponade.  4. The mitral valve is normal in structure. Trivial mitral valve regurgitation.  5. The aortic valve is tricuspid. Aortic valve regurgitation is not visualized. No aortic stenosis is present.  6. The inferior vena cava is normal in size with greater than 50% respiratory variability, suggesting right atrial pressure of 3 mmHg. Comparison(s): Compared to prior TTE on 05/2021, a moderate sized pericardial effusion is now present. Conclusion(s)/Recommendation(s): No intracardiac source of embolism detected on this transthoracic study. Consider a transesophageal echocardiogram to exclude cardiac source of embolism if clinically indicated. FINDINGS  Left Ventricle: Left ventricular ejection fraction, by estimation, is 65 to 70%. The left ventricle has normal function. The left ventricle has no regional wall motion abnormalities. The left ventricular internal cavity size was normal in size. There is  no left ventricular hypertrophy. Left ventricular diastolic parameters are consistent with Grade I diastolic  dysfunction (impaired relaxation). Right Ventricle: The right ventricular size is normal. No increase in right ventricular wall thickness. Right ventricular systolic function is normal. Left Atrium: Left atrial size was normal in size. Right Atrium: Right atrial size was normal in size. Pericardium: A moderately sized pericardial effusion is present. The pericardial effusion is circumferential. There is no evidence of cardiac tamponade. Mitral Valve: The mitral valve is normal in structure. Trivial mitral valve regurgitation. Tricuspid Valve: The tricuspid valve is normal in structure. Tricuspid valve regurgitation is mild. Aortic Valve: The aortic valve is tricuspid. Aortic valve regurgitation is not visualized. No aortic stenosis is present. Aortic valve mean gradient measures 3.0 mmHg. Aortic valve peak gradient measures 6.2 mmHg. Aortic valve area, by VTI measures 2.21 cm. Pulmonic Valve: The pulmonic valve was normal in structure. Pulmonic valve regurgitation is trivial. Aorta: The aortic root is normal in size and structure. Venous: The inferior vena cava is normal in size with greater than 50% respiratory variability, suggesting right atrial pressure of 3 mmHg. IAS/Shunts: The atrial septum is grossly normal.  LEFT VENTRICLE PLAX 2D LVIDd:         3.90 cm   Diastology LVIDs:         2.40 cm   LV e' medial:    5.33 cm/s LV PW:         1.00 cm   LV E/e' medial:  11.0 LV IVS:        0.90 cm   LV e' lateral:   9.46 cm/s LVOT diam:     1.80 cm   LV E/e' lateral: 6.2 LV SV:         53 LV SV Index:   38 LVOT Area:     2.54 cm  RIGHT VENTRICLE             IVC RV S prime:     15.30 cm/s  IVC diam: 1.10 cm TAPSE (M-mode): 2.1 cm LEFT ATRIUM             Index        RIGHT ATRIUM          Index LA diam:        2.40 cm 1.70 cm/m   RA Area:     9.25 cm LA Vol (A2C):   21.7 ml 15.37 ml/m  RA Volume:   18.95 ml 13.42 ml/m LA Vol (A4C):   31.5 ml 22.31 ml/m LA Biplane Vol: 28.3 ml 20.05 ml/m  AORTIC VALVE  PULMONIC VALVE AV Area (Vmax):    2.09 cm     PV Vmax:          0.77 m/s AV Area (Vmean):   2.02 cm     PV Peak grad:     2.4 mmHg AV Area (VTI):     2.21 cm     PR End Diast Vel: 6.86 msec AV Vmax:           124.00 cm/s AV Vmean:          83.300 cm/s AV VTI:            0.241 m AV Peak Grad:      6.2 mmHg AV Mean Grad:      3.0 mmHg LVOT Vmax:         102.00 cm/s LVOT Vmean:        66.000 cm/s LVOT VTI:          0.209 m LVOT/AV VTI ratio: 0.87  AORTA Ao Root diam: 2.60 cm Ao Asc diam:  2.60 cm MITRAL VALVE               TRICUSPID VALVE MV Area (PHT): 2.99 cm    TR Peak grad:   17.5 mmHg MV Decel Time: 254 msec    TR Vmax:        209.00 cm/s MV E velocity: 58.60 cm/s MV A velocity: 89.70 cm/s  SHUNTS MV E/A ratio:  0.65        Systemic VTI:  0.21 m                            Systemic Diam: 1.80 cm Gwyndolyn Kaufman MD Electronically signed by Gwyndolyn Kaufman MD Signature Date/Time: 08/14/2022/2:29:20 PM    Final    CT ANGIO HEAD NECK W WO CM  Result Date: 08/13/2022 CLINICAL DATA:  Stroke follow-up EXAM: CT ANGIOGRAPHY HEAD AND NECK TECHNIQUE: Multidetector CT imaging of the head and neck was performed using the standard protocol during bolus administration of intravenous contrast. Multiplanar CT image reconstructions and MIPs were obtained to evaluate the vascular anatomy. Carotid stenosis measurements (when applicable) are obtained utilizing NASCET criteria, using the distal internal carotid diameter as the denominator. RADIATION DOSE REDUCTION: This exam was performed according to the departmental dose-optimization program which includes automated exposure control, adjustment of the mA and/or kV according to patient size and/or use of iterative reconstruction technique. CONTRAST:  29mL OMNIPAQUE IOHEXOL 350 MG/ML SOLN COMPARISON:  07/01/2022 FINDINGS: CT HEAD FINDINGS Brain: There is no mass, hemorrhage or extra-axial collection. The size and configuration of the ventricles and extra-axial CSF  spaces are normal. There is no acute or chronic infarction. The brain parenchyma is normal. Skull: The visualized skull base, calvarium and extracranial soft tissues are normal. Sinuses/Orbits: No fluid levels or advanced mucosal thickening of the visualized paranasal sinuses. No mastoid or middle ear effusion. The orbits are normal. CTA NECK FINDINGS SKELETON: There is no bony spinal canal stenosis. No lytic or blastic lesion. OTHER NECK: Normal pharynx, larynx and major salivary glands. No cervical lymphadenopathy. Unremarkable thyroid gland. UPPER CHEST: Masslike opacity at the right hilum measuring 3.4 x 2.1 cm. There is peribronchial thickening in the right lung apex. Small right pleural effusion. AORTIC ARCH: There is no calcific atherosclerosis of the aortic arch. There is no aneurysm, dissection or hemodynamically significant stenosis of the visualized portion of the aorta. Conventional 3 vessel aortic branching pattern. The visualized proximal  subclavian arteries are widely patent. RIGHT CAROTID SYSTEM: Normal without aneurysm, dissection or stenosis. LEFT CAROTID SYSTEM: Normal without aneurysm, dissection or stenosis. VERTEBRAL ARTERIES: Left dominant configuration. Both origins are clearly patent. There is no dissection, occlusion or flow-limiting stenosis to the skull base (V1-V3 segments). CTA HEAD FINDINGS POSTERIOR CIRCULATION: --Vertebral arteries: Normal V4 segments. --Inferior cerebellar arteries: Normal. --Basilar artery: Normal. --Superior cerebellar arteries: Normal. --Posterior cerebral arteries (PCA): Normal. ANTERIOR CIRCULATION: --Intracranial internal carotid arteries: Normal. --Anterior cerebral arteries (ACA): Normal. Both A1 segments are present. Patent anterior communicating artery (a-comm). --Middle cerebral arteries (MCA): Normal. VENOUS SINUSES: As permitted by contrast timing, patent. ANATOMIC VARIANTS: None Review of the MIP images confirms the above findings. IMPRESSION: 1. No  emergent large vessel occlusion or high-grade stenosis of the intracranial or cervical arteries. 2. Masslike opacity at the right hilum measuring 3.4 x 2.1 cm, likely confluent hilar malignant adenopathy. 3. Small right pleural effusion. Electronically Signed   By: Ulyses Jarred M.D.   On: 08/13/2022 23:51   MR Brain Wo Contrast (neuro protocol)  Result Date: 08/13/2022 CLINICAL DATA:  TIA EXAM: MRI HEAD WITHOUT CONTRAST TECHNIQUE: Multiplanar, multiecho pulse sequences of the brain and surrounding structures were obtained without intravenous contrast. COMPARISON:  MRI Brain 07/31/22 FINDINGS: Brain: Small punctate focus of acute infarct in the left parietal lobe (series 5, image 33). No hemorrhage. No hydrocephalus. No extra-axial fluid collection. There are multiple small scattered subcortical T2/FLAIR hyperintense lesions, which are favored to represent sequela of chronic microvascular ischemic change in appear unchanged compared to recent prior brain MRI. Vascular: Normal flow voids. Skull and upper cervical spine: Normal marrow signal. Sinuses/Orbits: Negative. Other: None. IMPRESSION: Small focus of diffusion restriction in the left parietal lobe is favored to represent a punctate acute infarct, new compared to 07/31/22. Given patient's history of malignancy a follow up MRI in 2-3 months to ensure resolution. Electronically Signed   By: Marin Roberts M.D.   On: 08/13/2022 15:59   MR BRAIN W WO CONTRAST  Result Date: 08/01/2022 CLINICAL DATA:  Initial evaluation for headache, evaluate for metastatic disease. EXAM: MRI HEAD WITHOUT AND WITH CONTRAST TECHNIQUE: Multiplanar, multiecho pulse sequences of the brain and surrounding structures were obtained without and with intravenous contrast. CONTRAST:  9mL GADAVIST GADOBUTROL 1 MMOL/ML IV SOLN COMPARISON:  Prior head CT from 07/01/2022. FINDINGS: Brain: Cerebral volume within normal limits. Scattered patchy T2/FLAIR hyperintensity involving the  supratentorial cerebral white matter, nonspecific, but overall mild for age. No evidence for acute or subacute ischemia. Gray-white matter differentiation maintained. No areas of chronic cortical infarction. No acute or chronic intracranial blood products. No mass lesion, midline shift or mass effect. Ventricles normal size without hydrocephalus. Pituitary gland and suprasellar region normal. No abnormal enhancement or evidence for metastatic disease. Vascular: Major intracranial vascular flow voids are maintained. Skull and upper cervical spine: Craniocervical junction within normal limits. Bone marrow signal intensity normal. No focal marrow replacing lesion. No scalp soft tissue abnormality. Sinuses/Orbits: Globes orbital soft tissues within normal limits. Paranasal sinuses are largely clear. Trace right mastoid effusion, of doubtful significance. Other: None. IMPRESSION: 1. No acute intracranial abnormality. No evidence for intracranial metastatic disease. 2. Mild patchy T2/FLAIR hyperintensity involving the supratentorial cerebral white matter, nonspecific, but most commonly related to chronic microvascular ischemic disease. Electronically Signed   By: Jeannine Boga M.D.   On: 08/01/2022 01:54   NM PET Image Initial (PI) Skull Base To Thigh (F-18 FDG)  Result Date: 07/30/2022 CLINICAL DATA:  Initial treatment strategy  for chest CT demonstrating thoracic adenopathy and pulmonary nodules. Unintended weight loss EXAM: NUCLEAR MEDICINE PET SKULL BASE TO THIGH TECHNIQUE: 5.4 mCi F-18 FDG was injected intravenously. Full-ring PET imaging was performed from the skull base to thigh after the radiotracer. CT data was obtained and used for attenuation correction and anatomic localization. Fasting blood glucose: 102 mg/dl COMPARISON:  Chest CT 06/25/2022. Neck CT 06/25/2022. Abdominal CT 07/06/2021. FINDINGS: Mediastinal blood pool activity: SUV max 2.7 Liver activity: SUV max NA NECK: A low left  jugular/supraclavicular node measures 7 mm and a S.U.V. max of 5.4 on 40/4. Incidental CT findings: None. CHEST: Hypermetabolism corresponding to previously described confluence mediastinal and bilateral hilar adenopathy. Index AP window nodal conglomerate measures 2.3 cm and a S.U.V. max of 6.3 on 57/4, relatively similar in size on the prior CT. Right hilar nodal mass measures on the order of 2.1 cm and a S.U.V. max of 9.1 on 66/4. The diffuse pulmonary nodularity is also hypermetabolic. Example anteromedial right upper lobe pulmonary nodule of 7 mm and a S.U.V. max of 6.5 on 65/4. Felt to be increased since the prior exam. Incidental CT findings: Small right pleural effusion, new. Mild cardiomegaly with trace pericardial fluid or thickening. ABDOMEN/PELVIS: No abdominopelvic parenchymal or nodal hypermetabolism. Incidental CT findings: Normal adrenal glands.  Hysterectomy. SKELETON: Multifocal hypermetabolic osseous lesions. Example sclerotic right posterior acetabular and ischial lesion which is new since 07/06/2021 and measures a S.U.V. max of 7.7 on 166/4. An index T9 lesion measures a S.U.V. max of 12.3. Incidental CT findings: None. IMPRESSION: 1. Hypermetabolic thoracic/low cervical adenopathy, and pulmonary nodularity, favoring small-cell lung cancer versus lymphoma. 2. Hypermetabolic osseous lesions, most consistent with metastatic disease. 3. Although the thoracic findings could be seen with sarcoidosis, the hypermetabolic osseous lesions, including a new dominant right ischial sclerotic lesion, strongly favor metastasis. Hypermetabolic osseous sarcoid has been described but is not typically sclerotic. 4. New small right pleural effusion since 06/25/2022. Electronically Signed   By: Abigail Miyamoto M.D.   On: 07/30/2022 08:38    ASSESSMENT & PLAN:    57 year old female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

## 2022-08-26 NOTE — Progress Notes (Signed)
Referring Physician(s): [place] Past[place]   Supervising Physician: Daryll Brod  Patient Status:  [facility] - In-pt  Chief Complaint: Lung cancer, persistent right hydropneumothorax    Subjective: Pt currently asleep; under no apparent distress; afebrile; O2 sats 93% RA; chest tube back to waterseal after failed attempt at clamping earlier today by CCM   Allergies: Lidocaine, Percocet [oxycodone-acetaminophen], Hydromorphone hcl, and Oxycodone-acetaminophen  Medications: Prior to Admission medications   Medication Sig Start Date End Date Taking? Authorizing Provider  albuterol (VENTOLIN HFA) 108 (90 Base) MCG/ACT inhaler Inhale 2 puffs into the lungs every 6 (six) hours as needed for wheezing or shortness of breath. 05/31/22  Yes [name], MD  aspirin EC 81 MG tablet Take 1 tablet (81 mg total) by mouth daily. Swallow whole. 08/17/22  Yes Linus Galas, MD  clonazePAM (KLONOPIN) 1 MG tablet Take 1 tablet (1 mg total) by mouth 2 (two) times daily. 08/17/22  Yes Linus Galas, MD  clopidogrel (PLAVIX) 75 MG tablet Take 1 tablet (75 mg total) by mouth daily. 08/18/22  Yes Linus Galas, MD  dexamethasone (DECADRON) 4 MG tablet Take 1 tab 2 times daily starting day before pemetrexed. Then take 2 tabs daily x 3 days starting day after carboplatin. Take with food. 08/05/22  Yes Brunetta Genera, MD  diclofenac Sodium (VOLTAREN) 1 % GEL Apply 4 g topically 4 (four) times daily. Patient taking differently: Apply 4 g topically daily as needed. 08/04/22  Yes Linus Galas, MD  ergocalciferol (VITAMIN D2) 1.25 MG (50000 UT) capsule Take 1 capsule (50,000 Units total) by mouth once a week. 08/18/22  Yes Brunetta Genera, MD  folic acid (FOLVITE) 1 MG tablet Take 1 tablet (1 mg total) by mouth daily. Start 7 days before pemetrexed chemotherapy. Continue until 21 days after pemetrexed completed. 08/05/22  Yes Brunetta Genera, MD  HYDROcodone-acetaminophen  (NORCO/VICODIN) 5-325 MG tablet Take 1-2 tablets by mouth every 6 (six) hours as needed for moderate pain or severe pain. Do not drive after taking [id number]  Yes Pickenpack-Cousar, Athena N, NP  lidocaine (XYLOCAINE) 2 % solution Use as directed 15 mLs in the mouth or throat every 4 (four) hours as needed for mouth pain (sore throat). 08/04/22  Yes Linus Galas, MD  lidocaine-prilocaine (EMLA) cream Apply to affected area once Patient taking differently: Apply 1 Application topically daily as needed (For pain). 08/05/22  Yes Brunetta Genera, MD  ondansetron (ZOFRAN-ODT) 8 MG disintegrating tablet Take 1 tablet (8 mg total) by mouth every 8 (eight) hours as needed for nausea or vomiting. 08/04/22  Yes Linus Galas, MD  prochlorperazine (COMPAZINE) 10 MG tablet Take 1 tablet (10 mg total) by mouth every 6 (six) hours as needed for refractory nausea / vomiting. 08/04/22  Yes Linus Galas, MD  rosuvastatin (CRESTOR) 10 MG tablet Take 1 tablet (10 mg total) by mouth daily. 08/17/22  Yes Linus Galas, MD  sertraline (ZOLOFT) 100 MG tablet Take 100 mg by mouth 2 (two) times daily. 06/24/22  Yes [provider]  traZODone (DESYREL) 150 MG tablet Take 1 tablet (150 mg total) by mouth at bedtime as needed for sleep. Patient taking differently: Take 150 mg by mouth at bedtime. 08/17/22  Yes Linus Galas, MD  trimethobenzamide (TIGAN) 300 MG capsule Take 1 capsule (300 mg total) by mouth 3 (three) times daily as needed for nausea/vomiting. Tome 1 capsula (300 mg en total) por via oral 3 (tres) veces al dia segun sea necesario para las nauseas/vomitos. 08/12/22 08/12/23 Yes Humphrey Rolls,  Rosine Beat, MD     Vital Signs: BP 115/75 (BP Location: Right Arm)   Pulse 99   Temp 98.4 F (36.9 C) (Oral)   Resp 20   Ht '5\' 2"'$  (1.575 m)   Wt 100 lb 15.5 oz (45.8 kg)   LMP 02/08/2006   SpO2 93%   BMI 18.47 kg/m   Physical Exam pt asleep ; rt chest tube intact, OP 620 cc  yellow fluid; no obvious bubbles noted on air leak meter at present  Imaging: DG CHEST PORT 1 VIEW  Result Date: 08/26/2022 CLINICAL DATA:  Right pleural effusion. EXAM: PORTABLE CHEST 1 VIEW COMPARISON:  Chest radiographs 08/26/2022 at 4:54 a.m., 08/25/2022, 08/24/2022; CT chest 08/21/2022 FINDINGS: Right chest wall porta catheter tip again overlies the central superior vena cava. A right pigtail chest tube tip is again coronal overlying the lateral mid height of the right hemithorax, not simply changed. No significant change in small right apical pneumothorax measuring approximately 1.5 cm in craniocaudal height at the right lung apex. No significant change in right perihilar MR mild left midlung heterogeneous airspace opacities. Right superior perihilar fullness is again seen consistent with the lymphadenopathy seen on prior CT. No pleural effusion is seen. No acute skeletal abnormality. IMPRESSION: 1. No significant change in small right apical pneumothorax. Right pigtail chest tube tip overlies the lateral mid height of the right hemithorax, unchanged on frontal view. 2. No significant change in right perihilar and left midlung heterogeneous airspace opacities. Electronically Signed   By: Yvonne Kendall M.D.   On: 08/26/2022 13:07   DG Chest Port 1 View  Result Date: 08/26/2022 CLINICAL DATA:  Follow up pneumothorax EXAM: PORTABLE CHEST 1 VIEW COMPARISON:  08/25/2022 FINDINGS: Right apical pneumothorax status diminished slightly compared to the prior study with about 1.5 cm pleural separation at the apex. Right mid thorax chest tube in place. Diffuse alveolar and interstitial opacities consistent with pulmonary edema or bilateral pneumonia. No pleural effusions identified. Right-sided Port-A-Cath tip distal SVC. IMPRESSION: Persistent, slightly decreased right-sided pneumothorax with a right chest tube in place. Diffuse interstitial and alveolar opacities. Electronically Signed   By: Sammie Bench M.D.    On: 08/26/2022 07:50   CT [facility] PLEURAL DRAIN W/INDWELL CATH W/IMG GUIDE  Result Date: 08/25/2022 INDICATION: History of right-sided lung cancer, now with enlarging, symptomatic right-sided hydropneumothorax following recent thoracentesis. Please perform image guided placement of right-sided chest tube. EXAM: CT PERC PLEURAL DRAIN W/INDWELL CATH W/IMG GUIDE COMPARISON:  Chest radiograph-earlier same day; 08/23/2022; 08/22/2022; 08/21/2022 Ultrasound-guided right-sided thoracentesis-08/22/2022 Chest CT-08/21/2022 PET-CT-08/18/2022 MEDICATIONS: The patient is currently admitted to the hospital and receiving intravenous antibiotics. The antibiotics were administered within an appropriate time frame prior to the initiation of the procedure. ANESTHESIA/SEDATION: Moderate (conscious) sedation was employed during this procedure. A total of Versed 2 mg and Fentanyl 100 mcg was administered intravenously. Moderate Sedation Time: 21 minutes. The patient's level of consciousness and vital signs were monitored continuously by radiology nursing throughout the procedure under my direct supervision. CONTRAST:  None COMPLICATIONS: None immediate. PROCEDURE: RADIATION DOSE REDUCTION: This exam was performed according to the departmental dose-optimization program which includes automated exposure control, adjustment of the mA and/or kV according to patient size and/or use of iterative reconstruction technique. Informed written consent was obtained from the patient after a discussion of the risks, benefits and alternatives to treatment. The patient was placed supine on the CT gantry and a pre procedural CT was performed re-demonstrating the known moderate-sized right-sided hydropneumothorax. The procedure was planned  utilizing an anterolateral approach with attention made to avoid the port a catheter reservoir site. A timeout was performed prior to the initiation of the procedure. The skin overlying the right upper chest was  prepped and draped in the usual sterile fashion. The overlying soft tissues were anesthetized with 1% lidocaine with epinephrine. Next, an 78 gauge trocar needle was utilized to access the superolateral aspect of the right pleural space. Appropriate position was confirmed with the aspiration of air. Next, a short Amplatz wire was coiled within the apical aspect of the right pleural space. Appropriate position was confirmed with CT imaging (series 6). Next, the tract was serially dilated allowing placement of a 10 [place] all-purpose drainage catheter. Appropriate positioning was confirmed with a limited postprocedural CT scan. Next, air and approximately 250 cc of serous fluid was aspirated. The tube was connected to a pleura vac device and sutured in place. A dressing was applied. The patient tolerated the procedure well without immediate post procedural complication. IMPRESSION: Successful CT guided placement of a 10 French all purpose drain catheter into the apical aspects of right-sided hydropneumothorax with aspiration of air and approximately 250 cc of serous fluid. Electronically Signed   By: Sandi Mariscal M.D.   On: 08/25/2022 09:45   DG CHEST PORT 1 VIEW  Result Date: 08/25/2022 CLINICAL DATA:  Right pneumothorax. Mediastinal masses, no malignancy. EXAM: PORTABLE CHEST 1 VIEW COMPARISON:  Chest radiographs 08/24/2022 at 12:25 p.m., 08/23/2022, 08/22/2022; CT chest 08/21/2022; CT-guided percutaneous chest tube placement 08/24/2022 at 3 p.m. FINDINGS: The patient underwent CT-guided percutaneous chest tube placement on the right 08/24/2022 at 3 p.m. Compared to 08/24/2022 at 12:25 p.m. chest radiograph, there is a new right-sided chest tube with pigtail overlying the lateral right hemithorax at mid height. Interval decrease in right pneumothorax, with the extrapleural air space measuring approximately 2.3 cm in craniocaudal dimension at the superior right hemithorax compared to 5.3 cm on 08/24/2022 prior to  the chest tube placement. The prior layering right basilar air-fluid level is no longer visualized. No left pneumothorax. There are patchy heterogeneous airspace opacities throughout the right mid to lower lung and left perihilar region, similar to prior. Cardiac silhouette is normal in size. Right greater than left hilar fullness consistent with the mediastinal masses seen on prior CT. Right chest wall porta catheter tip overlies the central superior vena cava/right atrial junction. No acute skeletal abnormality. IMPRESSION: Compared to 08/24/2022 at 12:25 p.m.: 1. New right-sided chest tube with pigtail overlying the lateral right hemithorax at mid height. 2. Interval decrease in right pneumothorax, with the extrapleural air space measuring approximately 2.3 cm in craniocaudal dimension at the superior right hemithorax compared to 5.3 cm on 08/24/2022 prior to the chest tube placement. Electronically Signed   By: Yvonne Kendall M.D.   On: 08/25/2022 08:35   DG Chest 2 View  Addendum Date: 08/24/2022   ADDENDUM REPORT: 08/24/2022 12:53 ADDENDUM: Findings were discussed with and acknowledged by Dr. Barb Merino. Electronically Signed   By: Sammie Bench M.D.   On: 08/24/2022 12:53   Result Date: 08/24/2022 CLINICAL DATA:  Follow up pneumothorax EXAM: CHEST - 2 VIEW COMPARISON:  08/23/2022 FINDINGS: Right-sided hydropneumothorax has increased in size and there is no evidence of shift of mediastinal structures to the left. There is alveolar consolidation both lungs consistent with edema. Cardiac silhouette is unremarkable. Right-sided Port-A-Cath tip superimposed with distal SVC. IMPRESSION: Right hydropneumothorax with an interval increase in size and new mediastinal shift. Diffuse alveolar consolidation lungs consistent  with edema. Electronically Signed: By: Sammie Bench M.D. On: 08/24/2022 12:42   DG Chest 1 View  Result Date: 08/23/2022 CLINICAL DATA:  Right apical pneumothorax EXAM: CHEST  1 VIEW  COMPARISON:  Chest radiograph dated 08/22/2022 FINDINGS: Lines/tubes: Right chest wall port tip projects over the superior cavoatrial junction. Chest: Increased right lung opacification. Minimal patchy and interstitial left mid lung opacities. Pleura: Slightly increased small right apical pneumothorax. Slightly increased trace right pleural effusion. Heart/mediastinum: Similar  cardiomediastinal silhouette. Bones: No acute osseous abnormality. IMPRESSION: 1. Slightly increased small right apical pneumothorax and trace right pleural effusion. 2. Increased right lung opacification, which may represent atelectasis, pneumonia, or aspiration. Electronically Signed   By: Darrin Nipper M.D.   On: 08/23/2022 15:00    Labs:  CBC: Recent Labs    08/21/22 1930 08/22/22 0544 08/23/22 0515 08/25/22 0548  WBC 21.1* 20.0* 14.3* 12.6*  HGB 13.5 12.4 11.8* 12.2  HCT 39.6 36.9 35.6* 36.1  PLT 469* 403* 364 297    COAGS: Recent Labs    07/01/22 1122  INR 1.0    BMP: Recent Labs    08/21/22 1930 08/22/22 0544 08/23/22 0515 08/25/22 0548  NA 133* 135 132* 131*  K 3.3* 4.0 3.7 3.6  CL 105 107 105 101  CO2 18* 21* 21* 23  GLUCOSE 124* 105* 105* 117*  BUN '16 13 10 9  '$ CALCIUM 8.0* 7.8* 7.3* 7.5*  CREATININE 0.54 0.53 0.52 0.57  GFRNONAA >60 >60 >60 >60    LIVER FUNCTION TESTS: Recent Labs    08/21/22 1930 08/22/22 0544 08/23/22 0515 08/25/22 0548  BILITOT 0.7 0.7 0.6 0.7  AST 34 '28 19 19  '$ ALT 54* 44 34 33  ALKPHOS 129* 127* 109 96  PROT 7.5 6.6 6.3* 6.7  ALBUMIN 4.0 3.6 3.4* 3.4*    Assessment and Plan: Pt with hx stage IV lung cancer, recent rt thoracentesis with subsequent enlarging rt hydroptx, likely component of trapped lung as well; s/p rt chest tube placement 08/24/22; afebrile, no new labs; latest CXR today-1. No significant change in small right apical pneumothorax. Right pigtail chest tube tip overlies the lateral mid height of the right hemithorax, unchanged on frontal  view. 2. No significant change in right perihilar and left midlung heterogeneous airspace opacities.  Cont current tx; check am CXR; additional plans as outlined by CCM/TRH     Electronically Signed: D. Rowe Robert, PA-C 08/26/2022, 2:43 PM   I spent a total of 15 Minutes at the the patient's bedside AND on the patient's hospital floor or unit, greater than 50% of which was counseling/coordinating care for right chest tube placement    Patient ID: Karen [name], female   DOB: 1965/11/12, 57 y.o.   MRN: [id number]
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
## 2022-08-27 ENCOUNTER — Inpatient Hospital Stay: Payer: Commercial Managed Care - HMO

## 2022-08-27 ENCOUNTER — Other Ambulatory Visit (HOSPITAL_COMMUNITY): Payer: Self-pay

## 2022-08-27 ENCOUNTER — Encounter: Payer: Self-pay | Admitting: Hematology

## 2022-08-27 ENCOUNTER — Inpatient Hospital Stay: Payer: Commercial Managed Care - HMO | Admitting: Dietician

## 2022-08-27 ENCOUNTER — Inpatient Hospital Stay: Payer: Commercial Managed Care - HMO | Admitting: Hematology

## 2022-08-27 ENCOUNTER — Other Ambulatory Visit: Payer: Self-pay

## 2022-08-27 ENCOUNTER — Inpatient Hospital Stay (HOSPITAL_COMMUNITY): Payer: Commercial Managed Care - HMO

## 2022-08-27 DIAGNOSIS — Z7189 Other specified counseling: Secondary | ICD-10-CM

## 2022-08-27 DIAGNOSIS — J90 Pleural effusion, not elsewhere classified: Secondary | ICD-10-CM | POA: Diagnosis not present

## 2022-08-27 LAB — GUARDANT 360

## 2022-08-27 LAB — CULTURE, BODY FLUID W GRAM STAIN -BOTTLE: Culture: NO GROWTH

## 2022-08-27 MED ORDER — ONDANSETRON 8 MG/NS 50 ML IVPB
8.0000 mg | Freq: Two times a day (BID) | INTRAVENOUS | Status: DC
  Administered 2022-08-27 – 2022-09-02 (×13): 8 mg via INTRAVENOUS
  Filled 2022-08-27: qty 54
  Filled 2022-08-27: qty 8
  Filled 2022-08-27 (×2): qty 54
  Filled 2022-08-27 (×5): qty 8
  Filled 2022-08-27: qty 54
  Filled 2022-08-27 (×4): qty 8
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
## 2022-08-27 NOTE — Plan of Care (Addendum)
Patient AOX4, VSS throughout shift.   All meds given on time as ordered.  Pt c/o pain relieved by PRN norco.  Diminished lungs, IS encouraged.  Chest tube to water seal and intact, dressing intact, 211m output total this shift.  Pt had emesis episode and PRN compazine give for relief.  Pt used bedside commode.  POC maintained, will continue to monitor.  Problem: Education: Goal: Knowledge of disease or condition will improve Outcome: Progressing Goal: Knowledge of secondary prevention will improve (MUST DOCUMENT ALL) Outcome: Progressing Goal: Knowledge of patient specific risk factors will improve ([place]/A or DELETE if not current risk factor) Outcome: Progressing   Problem: Ischemic Stroke/TIA Tissue Perfusion: Goal: Complications of ischemic stroke/TIA will be minimized Outcome: Progressing   Problem: Coping: Goal: Will verbalize positive feelings about self Outcome: Progressing Goal: Will identify appropriate support needs Outcome: Progressing   Problem: Health Behavior/Discharge Planning: Goal: Ability to manage health-related needs will improve Outcome: Progressing Goal: Goals will be collaboratively established with patient/family Outcome: Progressing   Problem: Self-Care: Goal: Ability to participate in self-care as condition permits will improve Outcome: Progressing Goal: Verbalization of feelings and concerns over difficulty with self-care will improve Outcome: Progressing Goal: Ability to communicate needs accurately will improve Outcome: Progressing   Problem: Nutrition: Goal: Risk of aspiration will decrease Outcome: Progressing Goal: Dietary intake will improve Outcome: Progressing   Problem: Education: Goal: Knowledge of General Education information will improve Description: Including pain rating scale, medication(s)/side effects and non-pharmacologic comfort measures Outcome: Progressing   Problem: Health Behavior/Discharge Planning: Goal: Ability to  manage health-related needs will improve Outcome: Progressing   Problem: Clinical Measurements: Goal: Ability to maintain clinical measurements within normal limits will improve Outcome: Progressing Goal: Will remain free from infection Outcome: Progressing Goal: Diagnostic test results will improve Outcome: Progressing Goal: Respiratory complications will improve Outcome: Progressing Goal: Cardiovascular complication will be avoided Outcome: Progressing   Problem: Activity: Goal: Risk for activity intolerance will decrease Outcome: Progressing   Problem: Nutrition: Goal: Adequate nutrition will be maintained Outcome: Progressing   Problem: Coping: Goal: Level of anxiety will decrease Outcome: Progressing   Problem: Elimination: Goal: Will not experience complications related to bowel motility Outcome: Progressing Goal: Will not experience complications related to urinary retention Outcome: Progressing   Problem: Pain Managment: Goal: General experience of comfort will improve Outcome: Progressing   Problem: Safety: Goal: Ability to remain free from injury will improve Outcome: Progressing   Problem: Skin Integrity: Goal: Risk for impaired skin integrity will decrease Outcome: Progressing

## 2022-08-27 NOTE — Progress Notes (Signed)
PCCM Brief Progress Note  Notified pt feeling short of breath 30 min after clamping. Repeat CXR to me shows no change in size of pneumothorax. Keep chest tube clamped, we'll repeat CXR later this afternoon.   Karen [name]

## 2022-08-27 NOTE — Progress Notes (Signed)
PROGRESS NOTE    Karen [name]  [id number] DOB: 1965-07-09 DOA: 08/21/2022 PCP: Starlyn Skeans, MD    Brief Narrative:  57 year old female with history of non-small cell lung cancer on chemotherapy, chronic back pain and hypertension who underwent first round of induction chemotherapy with carboplatin and Alimta on 2/28 and since then having intractable nausea vomiting chest pain and shortness of breath. Unable to tolerate oral medications and diet.  Patient admitted due to cancer related pain, intractable nausea unrelieved at home.  She also noted to have right-sided pleural effusion, drained with a small pneumothorax. Continues to be symptomatic and found to have expanding pneumothorax and now managed with chest tube drainage. Intolerance to capping trials. 3/8, clamping trial in progress.  Patient feels uncomfortable however pneumothorax remains stable.   Assessment & Plan:   Intractable nausea and vomiting secondary to chemotherapy, no oral intake: Severe cancer related pain:  Persistent nausea.  She wants to try some meal today.  Will write regular diet so she can choose what she can comfortably take. Continue maintenance IV fluids. Patient on Klonopin 1 mg twice daily Dexamethasone 8 mg IV daily Marinol 5 mg twice daily Fentanyl patch 12 mcg/h Reglan 10 mg every 8 hours as scheduled Zyprexa 10 mg daily Trazodone at night Fentanyl IV as needed for pain relief Compazine as needed. Appreciate oncology and palliative care follow-up and symptom management.  Right-sided hemopneumothorax with lung cancer postprocedure: Patient continues to have cough and shortness of breath.  Chest x-ray showed expanding pneumothorax with midline shift.   Status post chest tube placement, continues to drain.   Unsuccessful clamping trial 3/7, followed by pulmonary.   Clamping trial today.  Hypophosphatemia: Replaced and adequate.   Metastatic non-small cell lung cancer on  chemotherapy: Followed by oncology.  Consulted to follow-up in the hospital.  Palliative care also consulted for symptom management. Followed by oncologist. She is likely hospice candidate.    DVT prophylaxis: enoxaparin (LOVENOX) injection 40 mg Start: 08/25/22 1000   Code Status: Full code Family Communication: None at the bedside Disposition Plan: Status is: Inpatient Remains inpatient appropriate because: Persistent nausea vomiting, shortness of breath.     Consultants:  Oncology Palliative care IR. Pulmonary  Procedures:  Thoracentesis Right-sided chest tube 3/5  Antimicrobials:  None   Subjective:  I examined patient in the morning rounds.  Today after 7 days in the hospital she looks comfortable.  She told me that they did not offer any diet to her.  She thinks she can try some liquid.  Pain is moderate and mostly on the epigastric and retrosternal area.  Objective: Vitals:   08/26/22 0742 08/26/22 1345 08/26/22 2059 08/27/22 0500  BP: 116/87 115/75 127/77 118/71  Pulse: (!) 109 99 99 97  Resp: '20 20 16 16  '$ Temp: 98.2 F (36.8 C) 98.4 F (36.9 C) 98.4 F (36.9 C) 97.6 F (36.4 C)  TempSrc: Oral Oral Oral Oral  SpO2: 92% 93% 96% 91%  Weight:      Height:        Intake/Output Summary (Last 24 hours) at 08/27/2022 1330 Last data filed at 08/27/2022 0500 Gross per 24 hour  Intake 749.53 ml  Output 210 ml  Net 539.53 ml    Filed Weights   08/21/22 1919 08/22/22 0146  Weight: 46.3 kg 45.8 kg    Examination:  General exam: Sick looking.  Mildly anxious.  Comfortable today.   Respiratory system: Poor inspiratory efforts. No added sounds.  Port-A-Cath present right  chest wall. Chest tube on waterseal.  Minimal clear drainage present. Cardiovascular system: S1 & S2 heard, RRR.  Gastrointestinal system: Soft.  Nondistended.  Mild epigastric tenderness mostly on the superficial skin palpation.   Central nervous system: Alert and oriented. No focal  neurological deficits. Psychiatry: Judgement and insight appear normal.  Normal mood today.    Data Reviewed: I have personally reviewed following labs and imaging studies  CBC: Recent Labs  Lab 08/21/22 1930 08/22/22 0544 08/23/22 0515 08/25/22 0548  WBC 21.1* 20.0* 14.3* 12.6*  NEUTROABS  --   --  12.4* 8.7*  HGB 13.5 12.4 11.8* 12.2  HCT 39.6 36.9 35.6* 36.1  MCV 91.0 93.2 92.5 90.9  PLT 469* 403* 364 [id number]    Basic Metabolic Panel: Recent Labs  Lab 08/21/22 1930 08/21/22 1958 08/22/22 0544 08/23/22 0515 08/25/22 0548  NA 133*  --  135 132* 131*  K 3.3*  --  4.0 3.7 3.6  CL 105  --  107 105 101  CO2 18*  --  21* 21* 23  GLUCOSE 124*  --  105* 105* 117*  BUN 16  --  '13 10 9  '$ CREATININE 0.54  --  0.53 0.52 0.57  CALCIUM 8.0*  --  7.8* 7.3* 7.5*  MG  --  2.4  --  2.5* 2.1  PHOS  --   --   --  1.5* 1.9*    GFR: Estimated Creatinine Clearance: 56.8 mL/min (by C-G formula based on SCr of 0.57 mg/dL). Liver Function Tests: Recent Labs  Lab 08/21/22 1930 08/22/22 0544 08/23/22 0515 08/25/22 0548  AST 34 '28 19 19  '$ ALT 54* 44 34 33  ALKPHOS 129* 127* 109 96  BILITOT 0.7 0.7 0.6 0.7  PROT 7.5 6.6 6.3* 6.7  ALBUMIN 4.0 3.6 3.4* 3.4*    Recent Labs  Lab 08/21/22 1930  LIPASE 46    No results for input(s): "AMMONIA" in the last 168 hours. Coagulation Profile: No results for input(s): "INR", "PROTIME" in the last 168 hours. Cardiac Enzymes: No results for input(s): "CKTOTAL", "CKMB", "CKMBINDEX", "TROPONINI" in the last 168 hours. BNP (last 3 results) No results for input(s): "PROBNP" in the last 8760 hours. HbA1C: No results for input(s): "HGBA1C" in the last 72 hours. CBG: No results for input(s): "GLUCAP" in the last 168 hours. Lipid Profile: No results for input(s): "CHOL", "HDL", "LDLCALC", "TRIG", "CHOLHDL", "LDLDIRECT" in the last 72 hours. Thyroid Function Tests: No results for input(s): "TSH", "T4TOTAL", "FREET4", "T3FREE", "THYROIDAB" in the  last 72 hours. Anemia Panel: No results for input(s): "VITAMINB12", "FOLATE", "FERRITIN", "TIBC", "IRON", "RETICCTPCT" in the last 72 hours. Sepsis Labs: Recent Labs  Lab 08/21/22 1958  LATICACIDVEN 1.2     Recent Results (from the past 240 hour(s))  Culture, body fluid w Gram Stain-bottle     Status: None   Collection Time: 08/21/22  7:16 PM   Specimen: Fluid  Result Value Ref Range Status   Specimen Description FLUID  Final   Special Requests NONE  Final   Culture   Final    NO GROWTH 5 DAYS Performed at [facility], [address], [place], [place] [address]    Report Status 08/27/2022 FINAL  Final  Gram stain     Status: None   Collection Time: 08/21/22  7:16 PM   Specimen: Fluid  Result Value Ref Range Status   Specimen Description FLUID  Final   Special Requests NONE  Final   Gram Stain  Final    FEW WBC PRESENT, PREDOMINANTLY PMN NO ORGANISMS SEEN Performed at [facility] [address], [place], [place] [address]    Report Status 08/22/2022 FINAL  Final  Resp panel by RT-PCR (RSV, Flu A&B, Covid) Anterior Nasal Swab     Status: None   Collection Time: 08/21/22  7:41 PM   Specimen: Anterior Nasal Swab  Result Value Ref Range Status   SARS Coronavirus 2 by RT PCR NEGATIVE NEGATIVE Final    Comment: (NOTE) SARS-CoV-2 target nucleic acids are NOT DETECTED.  The SARS-CoV-2 RNA is generally detectable in upper respiratory specimens during the acute phase of infection. The lowest concentration of SARS-CoV-2 viral copies this assay can detect is 138 copies/mL. A negative result does not preclude SARS-Cov-2 infection and should not be used as the sole basis for treatment or other patient management decisions. A negative result may occur with  improper specimen collection/handling, submission of specimen other than nasopharyngeal swab, presence of viral mutation(s) within the areas targeted by this assay, and inadequate number of viral copies(<138  copies/mL). A negative result must be combined with clinical observations, patient history, and epidemiological information. The expected result is Negative.  Fact Sheet for Patients:  EntrepreneurPulse.com.au  Fact Sheet for Healthcare Providers:  IncredibleEmployment.be  This test is no t yet approved or cleared by the [place] FDA and  has been authorized for detection and/or diagnosis of SARS-CoV-2 by FDA under an Emergency Use Authorization (EUA). This EUA will remain  in effect (meaning this test can be used) for the duration of the COVID-19 declaration under Section 564(b)(1) of the Act, 21 U.S.C.section 360bbb-3(b)(1), unless the authorization is terminated  or revoked sooner.       Influenza A by PCR NEGATIVE NEGATIVE Final   Influenza B by PCR NEGATIVE NEGATIVE Final    Comment: (NOTE) The Xpert Xpress SARS-CoV-2/FLU/RSV plus assay is intended as an aid in the diagnosis of influenza from Nasopharyngeal swab specimens and should not be used as a sole basis for treatment. Nasal washings and aspirates are unacceptable for Xpert Xpress SARS-CoV-2/FLU/RSV testing.  Fact Sheet for Patients: EntrepreneurPulse.com.au  Fact Sheet for Healthcare Providers: IncredibleEmployment.be  This test is not yet approved or cleared by the [place] FDA and has been authorized for detection and/or diagnosis of SARS-CoV-2 by FDA under an Emergency Use Authorization (EUA). This EUA will remain in effect (meaning this test can be used) for the duration of the COVID-19 declaration under Section 564(b)(1) of the Act, 21 U.S.C. section 360bbb-3(b)(1), unless the authorization is terminated or revoked.     Resp Syncytial Virus by PCR NEGATIVE NEGATIVE Final    Comment: (NOTE) Fact Sheet for Patients: EntrepreneurPulse.com.au  Fact Sheet for Healthcare  Providers: IncredibleEmployment.be  This test is not yet approved or cleared by the [place] FDA and has been authorized for detection and/or diagnosis of SARS-CoV-2 by FDA under an Emergency Use Authorization (EUA). This EUA will remain in effect (meaning this test can be used) for the duration of the COVID-19 declaration under Section 564(b)(1) of the Act, 21 U.S.C. section 360bbb-3(b)(1), unless the authorization is terminated or revoked.  Performed at [facility], [place] [address], [place], [place] [address]   Culture, blood (routine x 2)     Status: None   Collection Time: 08/21/22  7:58 PM   Specimen: BLOOD  Result Value Ref Range Status   Specimen Description   Final    BLOOD PORTA CATH Performed at  [facility], [place] [address] [place], [place] [address]    Special Requests   Final    BOTTLES DRAWN AEROBIC AND ANAEROBIC Blood Culture adequate volume Performed at [place] [address] [place], [place] [address]    Culture   Final    NO GROWTH 5 DAYS Performed at [facility], [place] [address] [place], [place] [address]    Report Status 08/26/2022 FINAL  Final  Culture, blood (routine x 2)     Status: None   Collection Time: 08/21/22  7:58 PM   Specimen: BLOOD LEFT ARM  Result Value Ref Range Status   Specimen Description   Final    BLOOD LEFT ARM Performed at [facility], [place] [address] [place], [place] [address]    Special Requests   Final    BOTTLES DRAWN AEROBIC AND ANAEROBIC Blood Culture adequate volume Performed at [facility] [address], [place], [place] [address]    Culture   Final    NO GROWTH 5 DAYS Performed at [facility], [place] [address] [place], [place] [address]    Report Status 08/26/2022 FINAL  Final  Fungus Culture With Stain     Status: None (Preliminary result)   Collection Time: 08/22/22 10:02 AM   Specimen:  PATH Cytology Pleural fluid  Result Value Ref Range Status   Fungus Stain Final report  Final    Comment: (NOTE) Performed At: [facility] [place], [place] [id number] Rush Farmer MD [id number]    Fungus (Mycology) Culture PENDING  Incomplete   Fungal Source PLEURAL  Final    Comment: Performed at [facility], [place] [address] [place], [place] [address]  Fungus Culture Result     Status: None   Collection Time: 08/22/22 10:02 AM  Result Value Ref Range Status   Result 1 Comment  Final    Comment: (NOTE) KOH/Calcofluor preparation:  no fungus observed. Performed At: [facility] [place], [place] [id number] Rush Farmer MD [id number]          Radiology Studies: DG CHEST PORT 1 VIEW  Result Date: 08/27/2022 CLINICAL DATA:  Follow up pneumothorax.  Right chest tube clamping. EXAM: PORTABLE CHEST 1 VIEW COMPARISON:  Radiographs earlier the same date, 08/26/2022 and 08/25/2022. FINDINGS: 1025 hours. Right IJ Port-A-Cath extends to the superior cavoatrial junction. Pigtail chest tube peripherally in the right pleural space is unchanged. Small right apical pneumothorax is unchanged. The heart size and mediastinal contours are stable with known right paratracheal and bilateral hilar adenopathy. Unchanged right greater than left interstitial and airspace opacities in both lungs. No significant pleural effusion. No acute osseous findings. There is a small amount of soft tissue emphysema within the right lateral chest wall. IMPRESSION: 1. Stable small right apical pneumothorax following chest tube clamping. No significant change in bilateral interstitial and airspace opacities. 2. Stable mediastinal and hilar adenopathy. Electronically Signed   By: Richardean Sale M.D.   On: 08/27/2022 11:09   DG CHEST PORT 1 VIEW  Result Date: 08/27/2022 CLINICAL DATA:  Pneumothorax on right EXAM: PORTABLE CHEST 1 VIEW COMPARISON:   Radiograph 08/26/2022 FINDINGS: Chest port catheter tip overlies the distal SVC/superior cavoatrial junction. Unchanged cardiomediastinal silhouette. There are diffuse interstitial opacities with right greater than left airspace disease. There is a small right apical pneumothorax which is decreased from prior exam, measuring up to 1.6 cm in the apex, previously 2.5  cm. Stable right peripheral pigtail chest tube. Bones are unchanged. IMPRESSION: Decreased, small right apical pneumothorax with chest tube in place. Unchanged bilateral interstitial and airspace disease, right greater than left. Electronically Signed   By: Maurine Simmering M.D.   On: 08/27/2022 08:17   DG CHEST PORT 1 VIEW  Result Date: 08/26/2022 CLINICAL DATA:  Right pleural effusion. EXAM: PORTABLE CHEST 1 VIEW COMPARISON:  Chest radiographs 08/26/2022 at 4:54 a.m., 08/25/2022, 08/24/2022; CT chest 08/21/2022 FINDINGS: Right chest wall porta catheter tip again overlies the central superior vena cava. A right pigtail chest tube tip is again coronal overlying the lateral mid height of the right hemithorax, not simply changed. No significant change in small right apical pneumothorax measuring approximately 1.5 cm in craniocaudal height at the right lung apex. No significant change in right perihilar MR mild left midlung heterogeneous airspace opacities. Right superior perihilar fullness is again seen consistent with the lymphadenopathy seen on prior CT. No pleural effusion is seen. No acute skeletal abnormality. IMPRESSION: 1. No significant change in small right apical pneumothorax. Right pigtail chest tube tip overlies the lateral mid height of the right hemithorax, unchanged on frontal view. 2. No significant change in right perihilar and left midlung heterogeneous airspace opacities. Electronically Signed   By: Yvonne Kendall M.D.   On: 08/26/2022 13:07   DG Chest Port 1 View  Result Date: 08/26/2022 CLINICAL DATA:  Follow up pneumothorax EXAM:  PORTABLE CHEST 1 VIEW COMPARISON:  08/25/2022 FINDINGS: Right apical pneumothorax status diminished slightly compared to the prior study with about 1.5 cm pleural separation at the apex. Right mid thorax chest tube in place. Diffuse alveolar and interstitial opacities consistent with pulmonary edema or bilateral pneumonia. No pleural effusions identified. Right-sided Port-A-Cath tip distal SVC. IMPRESSION: Persistent, slightly decreased right-sided pneumothorax with a right chest tube in place. Diffuse interstitial and alveolar opacities. Electronically Signed   By: Sammie Bench M.D.   On: 08/26/2022 07:50        Scheduled Meds:  aspirin EC  81 mg Oral Daily   Chlorhexidine Gluconate Cloth  6 each Topical Daily   clonazePAM  1 mg Oral BID   clopidogrel  75 mg Oral Daily   dexamethasone (DECADRON) injection  8 mg Intravenous Daily   diclofenac Sodium  4 g Topical QID   dronabinol  5 mg Oral BID AC   enoxaparin (LOVENOX) injection  40 mg Subcutaneous Q24H   feeding supplement  1 Container Oral TID BM   fentaNYL  1 patch Transdermal [id number]   folic acid  1 mg Oral Daily   metoCLOPramide (REGLAN) injection  10 mg Intravenous Q8H   multivitamin with minerals  1 tablet Oral Daily   OLANZapine zydis  10 mg Oral Daily   pantoprazole  20 mg Oral Daily   rosuvastatin  10 mg Oral Daily   senna  1 tablet Oral QHS   sertraline  100 mg Oral BID   traZODone  150 mg Oral QHS   Vitamin D (Ergocalciferol)  50,000 Units Oral Weekly   Continuous Infusions:  lactated ringers 1,000 mL with potassium chloride 40 mEq infusion 75 mL/hr at 08/27/22 1038   ondansetron 8 mg (08/27/22 1240)     LOS: 5 days    Time spent: 40 minutes    Barb Merino, MD [facility] Pager [phone]
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
## 2022-08-28 ENCOUNTER — Inpatient Hospital Stay (HOSPITAL_COMMUNITY): Payer: Commercial Managed Care - HMO

## 2022-08-28 DIAGNOSIS — J90 Pleural effusion, not elsewhere classified: Secondary | ICD-10-CM | POA: Diagnosis not present
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
## 2022-08-28 NOTE — Progress Notes (Signed)
PROGRESS NOTE    Karen [name]  [id number] DOB: 27-Oct-1965 DOA: 08/21/2022 PCP: Starlyn Skeans, MD    Brief Narrative:  57 year old female with history of non-small cell lung cancer on chemotherapy, chronic back pain and hypertension who underwent first round of induction chemotherapy with carboplatin and Alimta on 2/28 and since then having intractable nausea vomiting chest pain and shortness of breath. Unable to tolerate oral medications and diet.  Patient admitted due to cancer related pain, intractable nausea unrelieved at home.  She also noted to have right-sided pleural effusion, drained with a small pneumothorax. Continues to be symptomatic and found to have expanding pneumothorax and now managed with chest tube drainage. Intolerance to capping trials. 3/8, clamping trial in progress.  Patient feels uncomfortable however pneumothorax remains stable. Pulmonary evaluating for Pleurx catheter placement after clamping chest tube for some time.   Assessment & Plan:   Intractable nausea and vomiting secondary to chemotherapy, no oral intake: Severe cancer related pain:  Persistent nausea.  Mild clinical improvement today and was able to take some sips of liquids.  Allow regular diet so she can choose what she can comfortably take. Continue maintenance IV fluids. Patient on Klonopin 1 mg twice daily Dexamethasone 8 mg IV daily Marinol 5 mg twice daily Fentanyl patch 12 mcg/h Reglan 10 mg every 8 hours as scheduled Zyprexa 10 mg daily Trazodone at night Fentanyl IV as needed for pain relief Compazine as needed. Appreciate oncology and palliative care follow-up and symptom management.  Right-sided hemopneumothorax with lung cancer postprocedure: Patient continues to have cough and shortness of breath.  Chest x-ray showed expanding pneumothorax with midline shift.  Status post chest tube placement, continues to drain.   Unsuccessful clamping trial 3/7, followed by  pulmonary.   3/8-3/9, chest tube clamped with stable pneumothorax.  Planning for Pleurx catheter exchange.  Hypophosphatemia: Replaced and adequate.   Metastatic non-small cell lung cancer on chemotherapy: Followed by oncology.  Consulted to follow-up in the hospital.  Palliative care also consulted for symptom management. Followed by oncologist. She is likely hospice candidate.  Patient is receiving oral chemotherapy now.   DVT prophylaxis: enoxaparin (LOVENOX) injection 40 mg Start: 08/25/22 1000   Code Status: Full code Family Communication: None at the bedside Disposition Plan: Status is: Inpatient Remains inpatient appropriate because: Persistent nausea vomiting, shortness of breath.     Consultants:  Oncology Palliative care IR. Pulmonary  Procedures:  Thoracentesis Right-sided chest tube 3/5  Antimicrobials:  None   Subjective:  Patient seen and examined.  She has some cough and it hurts to cough.  Patient tells me that she had some improvement of her appetite and she can try some liquid today.  It hurts to cough.  Normal bowel movements.  Not feeling short of breath after clamping trial for last 24 hours.  Objective: Vitals:   08/27/22 0500 08/27/22 1343 08/27/22 2136 08/28/22 0559  BP: 118/71 122/77 131/85 128/86  Pulse: 97 96 88 99  Resp: '16 20 13 16  '$ Temp: 97.6 F (36.4 C) 97.8 F (36.6 C) (!) 97.4 F (36.3 C) 98.1 F (36.7 C)  TempSrc: Oral Oral Oral Oral  SpO2: 91% 98% 100% 95%  Weight:      Height:        Intake/Output Summary (Last 24 hours) at 08/28/2022 1120 Last data filed at 08/28/2022 0300 Gross per 24 hour  Intake 1294.37 ml  Output --  Net 1294.37 ml    Filed Weights   08/21/22 1919 08/22/22  0146  Weight: 46.3 kg 45.8 kg    Examination:  General exam: Frail and debilitated.  Anxious.  Looks comfortable electively today. Respiratory system: Poor inspiratory efforts. No added sounds.  Port-A-Cath present right chest wall. Chest  tube clamped. Cardiovascular system: S1 & S2 heard, RRR.  Gastrointestinal system: Soft.  Nondistended.  Mild epigastric tenderness mostly on the superficial skin palpation.   Central nervous system: Alert and oriented. No focal neurological deficits. Psychiatry: Judgement and insight appear normal.  Normal mood today.    Data Reviewed: I have personally reviewed following labs and imaging studies  CBC: Recent Labs  Lab 08/21/22 1930 08/22/22 0544 08/23/22 0515 08/25/22 0548  WBC 21.1* 20.0* 14.3* 12.6*  NEUTROABS  --   --  12.4* 8.7*  HGB 13.5 12.4 11.8* 12.2  HCT 39.6 36.9 35.6* 36.1  MCV 91.0 93.2 92.5 90.9  PLT 469* 403* 364 [id number]    Basic Metabolic Panel: Recent Labs  Lab 08/21/22 1930 08/21/22 1958 08/22/22 0544 08/23/22 0515 08/25/22 0548  NA 133*  --  135 132* 131*  K 3.3*  --  4.0 3.7 3.6  CL 105  --  107 105 101  CO2 18*  --  21* 21* 23  GLUCOSE 124*  --  105* 105* 117*  BUN 16  --  '13 10 9  '$ CREATININE 0.54  --  0.53 0.52 0.57  CALCIUM 8.0*  --  7.8* 7.3* 7.5*  MG  --  2.4  --  2.5* 2.1  PHOS  --   --   --  1.5* 1.9*    GFR: Estimated Creatinine Clearance: 56.8 mL/min (by C-G formula based on SCr of 0.57 mg/dL). Liver Function Tests: Recent Labs  Lab 08/21/22 1930 08/22/22 0544 08/23/22 0515 08/25/22 0548  AST 34 '28 19 19  '$ ALT 54* 44 34 33  ALKPHOS 129* 127* 109 96  BILITOT 0.7 0.7 0.6 0.7  PROT 7.5 6.6 6.3* 6.7  ALBUMIN 4.0 3.6 3.4* 3.4*    Recent Labs  Lab 08/21/22 1930  LIPASE 46    No results for input(s): "AMMONIA" in the last 168 hours. Coagulation Profile: No results for input(s): "INR", "PROTIME" in the last 168 hours. Cardiac Enzymes: No results for input(s): "CKTOTAL", "CKMB", "CKMBINDEX", "TROPONINI" in the last 168 hours. BNP (last 3 results) No results for input(s): "PROBNP" in the last 8760 hours. HbA1C: No results for input(s): "HGBA1C" in the last 72 hours. CBG: No results for input(s): "GLUCAP" in the last 168  hours. Lipid Profile: No results for input(s): "CHOL", "HDL", "LDLCALC", "TRIG", "CHOLHDL", "LDLDIRECT" in the last 72 hours. Thyroid Function Tests: No results for input(s): "TSH", "T4TOTAL", "FREET4", "T3FREE", "THYROIDAB" in the last 72 hours. Anemia Panel: No results for input(s): "VITAMINB12", "FOLATE", "FERRITIN", "TIBC", "IRON", "RETICCTPCT" in the last 72 hours. Sepsis Labs: Recent Labs  Lab 08/21/22 1958  LATICACIDVEN 1.2     Recent Results (from the past 240 hour(s))  Culture, body fluid w Gram Stain-bottle     Status: None   Collection Time: 08/21/22  7:16 PM   Specimen: Fluid  Result Value Ref Range Status   Specimen Description FLUID  Final   Special Requests NONE  Final   Culture   Final    NO GROWTH 5 DAYS Performed at [facility], [address] [place], [place] [address]    Report Status 08/27/2022 FINAL  Final  Gram stain     Status: None   Collection Time: 08/21/22  7:16 PM  Specimen: Fluid  Result Value Ref Range Status   Specimen Description FLUID  Final   Special Requests NONE  Final   Gram Stain   Final    FEW WBC PRESENT, PREDOMINANTLY PMN NO ORGANISMS SEEN Performed at [facility], [address], [place], [place] [address]    Report Status 08/22/2022 FINAL  Final  Resp panel by RT-PCR (RSV, Flu A&B, Covid) Anterior Nasal Swab     Status: None   Collection Time: 08/21/22  7:41 PM   Specimen: Anterior Nasal Swab  Result Value Ref Range Status   SARS Coronavirus 2 by RT PCR NEGATIVE NEGATIVE Final    Comment: (NOTE) SARS-CoV-2 target nucleic acids are NOT DETECTED.  The SARS-CoV-2 RNA is generally detectable in upper respiratory specimens during the acute phase of infection. The lowest concentration of SARS-CoV-2 viral copies this assay can detect is 138 copies/mL. A negative result does not preclude SARS-Cov-2 infection and should not be used as the sole basis for treatment or other patient management decisions. A negative  result may occur with  improper specimen collection/handling, submission of specimen other than nasopharyngeal swab, presence of viral mutation(s) within the areas targeted by this assay, and inadequate number of viral copies(<138 copies/mL). A negative result must be combined with clinical observations, patient history, and epidemiological information. The expected result is Negative.  Fact Sheet for Patients:  EntrepreneurPulse.com.au  Fact Sheet for Healthcare Providers:  IncredibleEmployment.be  This test is no t yet approved or cleared by the [place] FDA and  has been authorized for detection and/or diagnosis of SARS-CoV-2 by FDA under an Emergency Use Authorization (EUA). This EUA will remain  in effect (meaning this test can be used) for the duration of the COVID-19 declaration under Section 564(b)(1) of the Act, 21 U.S.C.section 360bbb-3(b)(1), unless the authorization is terminated  or revoked sooner.       Influenza A by PCR NEGATIVE NEGATIVE Final   Influenza B by PCR NEGATIVE NEGATIVE Final    Comment: (NOTE) The Xpert Xpress SARS-CoV-2/FLU/RSV plus assay is intended as an aid in the diagnosis of influenza from Nasopharyngeal swab specimens and should not be used as a sole basis for treatment. Nasal washings and aspirates are unacceptable for Xpert Xpress SARS-CoV-2/FLU/RSV testing.  Fact Sheet for Patients: EntrepreneurPulse.com.au  Fact Sheet for Healthcare Providers: IncredibleEmployment.be  This test is not yet approved or cleared by the [place] FDA and has been authorized for detection and/or diagnosis of SARS-CoV-2 by FDA under an Emergency Use Authorization (EUA). This EUA will remain in effect (meaning this test can be used) for the duration of the COVID-19 declaration under Section 564(b)(1) of the Act, 21 U.S.C. section 360bbb-3(b)(1), unless the authorization is  terminated or revoked.     Resp Syncytial Virus by PCR NEGATIVE NEGATIVE Final    Comment: (NOTE) Fact Sheet for Patients: EntrepreneurPulse.com.au  Fact Sheet for Healthcare Providers: IncredibleEmployment.be  This test is not yet approved or cleared by the [place] FDA and has been authorized for detection and/or diagnosis of SARS-CoV-2 by FDA under an Emergency Use Authorization (EUA). This EUA will remain in effect (meaning this test can be used) for the duration of the COVID-19 declaration under Section 564(b)(1) of the Act, 21 U.S.C. section 360bbb-3(b)(1), unless the authorization is terminated or revoked.  Performed at [facility], [place] [address], [place], [place] [address]   Culture, blood (routine x 2)     Status: None   Collection Time: 08/21/22  7:58 PM   Specimen: BLOOD  Result Value Ref Range Status   Specimen Description   Final    BLOOD PORTA CATH Performed at [facility] [address] [place], [place] [address]    Special Requests   Final    BOTTLES DRAWN AEROBIC AND ANAEROBIC Blood Culture adequate volume Performed at [facility] [address], [place], [place] [address]    Culture   Final    NO GROWTH 5 DAYS Performed at [facility], [place] [address], [place], [place] [address]    Report Status 08/26/2022 FINAL  Final  Culture, blood (routine x 2)     Status: None   Collection Time: 08/21/22  7:58 PM   Specimen: BLOOD LEFT ARM  Result Value Ref Range Status   Specimen Description   Final    BLOOD LEFT ARM Performed at [facility], [place] [address], [place], [place] [address]    Special Requests   Final    BOTTLES DRAWN AEROBIC AND ANAEROBIC Blood Culture adequate volume Performed at [facility] [address], [place], [place] [address]    Culture   Final    NO GROWTH 5 DAYS Performed at [place], [place] [address], [place], [place] [address]    Report Status 08/26/2022 FINAL  Final  Fungus Culture With Stain     Status: None (Preliminary result)   Collection Time: 08/22/22 10:02 AM   Specimen: PATH Cytology Pleural fluid  Result Value Ref Range Status   Fungus Stain Final report  Final    Comment: (NOTE) Performed At: [facility] [place], [place] [id number] Rush Farmer MD [id number]    Fungus (Mycology) Culture PENDING  Incomplete   Fungal Source PLEURAL  Final    Comment: Performed at [facility], [place] [address], [place], [place] [address]  Fungus Culture Result     Status: None   Collection Time: 08/22/22 10:02 AM  Result Value Ref Range Status   Result 1 Comment  Final    Comment: (NOTE) KOH/Calcofluor preparation:  no fungus observed. Performed At: [facility] [place], [place] [id number] Rush Farmer MD [id number]          Radiology Studies: DG CHEST PORT 1 VIEW  Result Date: 08/28/2022 CLINICAL DATA:  Right-sided pleural effusion EXAM: PORTABLE CHEST 1 VIEW COMPARISON:  Prior chest x-ray 08/27/2022 FINDINGS: Right IJ approach single-lumen power injectable port catheter remains in good position with the tip overlying the cavoatrial junction. Right-sided pigtail thoracostomy tube in unchanged position. Interval resolution of pneumothorax. Persistent small right-sided pleural effusion. Stable appearance of the lungs with right suprahilar mass, interstitial spread of disease throughout the right lung and left nodular airspace opacity. IMPRESSION: 1. Interval resolution of pneumothorax. 2. Small right-sided pleural effusion. 3. Stable support apparatus. 4. Stable known right upper lobe mass. Electronically Signed   By: Jacqulynn Cadet M.D.   On: 08/28/2022 08:43   DG CHEST PORT 1 VIEW  Result Date: 08/27/2022 CLINICAL DATA:  Pneumothorax on right side. EXAM: PORTABLE CHEST 1 VIEW COMPARISON:   Radiograph earlier today., Additional radiographs reviewed. CT 08/21/2022 FINDINGS: Right-sided pneumothorax is unchanged in size paralleling the posterior third rib, 17 mm from the under surface of the first rib. Pigtail catheter remains in place. Accessed chest port is again seen. Stable heart size and mediastinal contours with unchanged right suprahilar masslike opacity. Interstitial opacities diffusely are stable.  No new abnormalities. IMPRESSION: 1. Unchanged size of small right pneumothorax. Right pigtail catheter remains in place. 2. The exam is otherwise unchanged. Electronically Signed   By: Keith Rake M.D.   On: 08/27/2022 16:31   DG CHEST PORT 1 VIEW  Result Date: 08/27/2022 CLINICAL DATA:  Follow up pneumothorax.  Right chest tube clamping. EXAM: PORTABLE CHEST 1 VIEW COMPARISON:  Radiographs earlier the same date, 08/26/2022 and 08/25/2022. FINDINGS: 1025 hours. Right IJ Port-A-Cath extends to the superior cavoatrial junction. Pigtail chest tube peripherally in the right pleural space is unchanged. Small right apical pneumothorax is unchanged. The heart size and mediastinal contours are stable with known right paratracheal and bilateral hilar adenopathy. Unchanged right greater than left interstitial and airspace opacities in both lungs. No significant pleural effusion. No acute osseous findings. There is a small amount of soft tissue emphysema within the right lateral chest wall. IMPRESSION: 1. Stable small right apical pneumothorax following chest tube clamping. No significant change in bilateral interstitial and airspace opacities. 2. Stable mediastinal and hilar adenopathy. Electronically Signed   By: Richardean Sale M.D.   On: 08/27/2022 11:09   DG CHEST PORT 1 VIEW  Result Date: 08/27/2022 CLINICAL DATA:  Pneumothorax on right EXAM: PORTABLE CHEST 1 VIEW COMPARISON:  Radiograph 08/26/2022 FINDINGS: Chest port catheter tip overlies the distal SVC/superior cavoatrial junction. Unchanged  cardiomediastinal silhouette. There are diffuse interstitial opacities with right greater than left airspace disease. There is a small right apical pneumothorax which is decreased from prior exam, measuring up to 1.6 cm in the apex, previously 2.5 cm. Stable right peripheral pigtail chest tube. Bones are unchanged. IMPRESSION: Decreased, small right apical pneumothorax with chest tube in place. Unchanged bilateral interstitial and airspace disease, right greater than left. Electronically Signed   By: Maurine Simmering M.D.   On: 08/27/2022 08:17   DG CHEST PORT 1 VIEW  Result Date: 08/26/2022 CLINICAL DATA:  Right pleural effusion. EXAM: PORTABLE CHEST 1 VIEW COMPARISON:  Chest radiographs 08/26/2022 at 4:54 a.m., 08/25/2022, 08/24/2022; CT chest 08/21/2022 FINDINGS: Right chest wall porta catheter tip again overlies the central superior vena cava. A right pigtail chest tube tip is again coronal overlying the lateral mid height of the right hemithorax, not simply changed. No significant change in small right apical pneumothorax measuring approximately 1.5 cm in craniocaudal height at the right lung apex. No significant change in right perihilar MR mild left midlung heterogeneous airspace opacities. Right superior perihilar fullness is again seen consistent with the lymphadenopathy seen on prior CT. No pleural effusion is seen. No acute skeletal abnormality. IMPRESSION: 1. No significant change in small right apical pneumothorax. Right pigtail chest tube tip overlies the lateral mid height of the right hemithorax, unchanged on frontal view. 2. No significant change in right perihilar and left midlung heterogeneous airspace opacities. Electronically Signed   By: Yvonne Kendall M.D.   On: 08/26/2022 13:07        Scheduled Meds:  aspirin EC  81 mg Oral Daily   Chlorhexidine Gluconate Cloth  6 each Topical Daily   clonazePAM  1 mg Oral BID   clopidogrel  75 mg Oral Daily   dexamethasone (DECADRON) injection  8 mg  Intravenous Daily   diclofenac Sodium  4 g Topical QID   dronabinol  5 mg Oral BID AC   enoxaparin (LOVENOX) injection  40 mg Subcutaneous Q24H   feeding supplement  1 Container Oral TID BM   fentaNYL  1 patch Transdermal [id number]   folic  acid  1 mg Oral Daily   metoCLOPramide (REGLAN) injection  10 mg Intravenous Q8H   multivitamin with minerals  1 tablet Oral Daily   OLANZapine zydis  10 mg Oral Daily   pantoprazole  20 mg Oral Daily   rosuvastatin  10 mg Oral Daily   senna  1 tablet Oral QHS   sertraline  100 mg Oral BID   traZODone  150 mg Oral QHS   Vitamin D (Ergocalciferol)  50,000 Units Oral Weekly   Continuous Infusions:  lactated ringers 1,000 mL with potassium chloride 40 mEq infusion 75 mL/hr at 08/27/22 1038   ondansetron 8 mg (08/28/22 1008)     LOS: 6 days    Time spent: 40 minutes    Barb Merino, MD [facility] Pager [phone]

## 2022-08-29 ENCOUNTER — Inpatient Hospital Stay (HOSPITAL_COMMUNITY): Payer: Commercial Managed Care - HMO

## 2022-08-29 DIAGNOSIS — R112 Nausea with vomiting, unspecified: Secondary | ICD-10-CM | POA: Diagnosis not present

## 2022-08-29 DIAGNOSIS — G893 Neoplasm related pain (acute) (chronic): Secondary | ICD-10-CM | POA: Diagnosis not present

## 2022-08-29 NOTE — Progress Notes (Signed)
PT Cancellation Note  Patient Details Name: Karen [name] MRN: [id number] DOB: September 15, 1965   Cancelled Treatment:    Reason Eval/Treat Not Completed:  Attempted PT eval-nursing in helping pt bathe at this time. Will check back as schedule allows.    [facility]  Office: [phone]

## 2022-08-29 NOTE — Progress Notes (Signed)
PROGRESS NOTE    Karen [name]  [id number] DOB: 01-10-1966 DOA: 08/21/2022 PCP: Starlyn Skeans, MD   Brief Narrative:  57 year old female with history of non-small cell lung cancer on chemotherapy, chronic back pain and hypertension who underwent first round of induction chemotherapy with carboplatin and Alimta on 08/18/22 and since then started having intractable nausea, vomiting chest pain and shortness of breath and unable to tolerate oral medications and diet and subsequently admitted.  She was found to have sided pleural effusion which was drained resulting in pneumothorax which was managed by pigtail catheter placement on 08/24/2022 by IR.  Pulmonary and palliative care were consulted.  Patient catheter removed on 08/28/2022.  Assessment & Plan:   Intractable nausea and vomiting secondary to chemotherapy Severe cancer related pain -Oncology evaluated the patient during this hospitalization. -Palliative care following and helping with symptom management: Continue Decadron, olanzapine, Protonix, Marinol, Reglan along with as needed Compazine and Ativan. -Continue fentanyl patch and as needed Norco.  Continue bowel regimen. -Encourage oral intake.  Currently still on IV fluids.  DC IV fluids and monitor.  Right-sided pleural effusion status post thoracentesis with resultant right-sided hydropneumothorax -Pulmonary following.  Pigtail catheter placed on 08/24/2022 by IR and subsequently removed by pulmonary on 08/28/2022.  Follow further pulmonary recommendations.  Might need Pleurx catheter.  Metastatic non-small cell lung cancer on chemotherapy Goals of care -Oncology evaluated the patient during this hospitalization.  Outpatient follow-up with oncology. -Overall prognosis is guarded to poor.  Currently remains full code: Palliative care following  Leukocytosis -No labs today  Hyponatremia -No labs today.  Monitor intermittently.  Encourage oral intake.  Physical  deconditioning -PT eval  DVT prophylaxis: Lovenox Code Status: Full Family Communication: None at bedside Disposition Plan: Status is: Inpatient Remains inpatient appropriate because: Of severity of illness  Consultants: IR/oncology/pulmonary/palliative care  Procedures: As above  Antimicrobials: None   Subjective: Patient seen and examined at bedside.  Still complains of cough and shortness of breath.  Does not feel ready to go home.  No fever or vomiting reported.  Oral intake is not that good yet.  Objective: Vitals:   08/28/22 0559 08/28/22 1351 08/28/22 2139 08/29/22 0620  BP: 128/86 125/88 (!) 126/92 (!) 140/89  Pulse: 99 (!) 108 96 (!) 101  Resp: '16 20 18   '$ Temp: 98.1 F (36.7 C) 98 F (36.7 C) 98 F (36.7 C) 98.5 F (36.9 C)  TempSrc: Oral Oral Oral Oral  SpO2: 95% 100% 98% 96%  Weight:      Height:        Intake/Output Summary (Last 24 hours) at 08/29/2022 0939 Last data filed at 08/28/2022 1152 Gross per 24 hour  Intake 240 ml  Output --  Net 240 ml   Filed Weights   08/21/22 1919 08/22/22 0146  Weight: 46.3 kg 45.8 kg    Examination:  General exam: Appears calm and comfortable.  Looks chronically ill and deconditioned.  On room air. Respiratory system: Bilateral decreased breath sounds at bases with some scattered crackles Cardiovascular system: S1 & S2 heard, tachycardic  gastrointestinal system: Abdomen is distended, soft and nontender. Normal bowel sounds heard. Extremities: No cyanosis, clubbing, edema  Central nervous system: Alert and oriented.  Slow to respond.  Poor historian.  No focal neurological deficits. Moving extremities Skin: No rashes, lesions or ulcers Psychiatry: Flat affect.  Not agitated.    Data Reviewed: I have personally reviewed following labs and imaging studies  CBC: Recent Labs  Lab 08/23/22 0515 08/25/22  0548  WBC 14.3* 12.6*  NEUTROABS 12.4* 8.7*  HGB 11.8* 12.2  HCT 35.6* 36.1  MCV 92.5 90.9  PLT 364 [id number]    Basic Metabolic Panel: Recent Labs  Lab 08/23/22 0515 08/25/22 0548  NA 132* 131*  K 3.7 3.6  CL 105 101  CO2 21* 23  GLUCOSE 105* 117*  BUN 10 9  CREATININE 0.52 0.57  CALCIUM 7.3* 7.5*  MG 2.5* 2.1  PHOS 1.5* 1.9*   GFR: Estimated Creatinine Clearance: 56.8 mL/min (by C-G formula based on SCr of 0.57 mg/dL). Liver Function Tests: Recent Labs  Lab 08/23/22 0515 08/25/22 0548  AST 19 19  ALT 34 33  ALKPHOS 109 96  BILITOT 0.6 0.7  PROT 6.3* 6.7  ALBUMIN 3.4* 3.4*   No results for input(s): "LIPASE", "AMYLASE" in the last 168 hours. No results for input(s): "AMMONIA" in the last 168 hours. Coagulation Profile: No results for input(s): "INR", "PROTIME" in the last 168 hours. Cardiac Enzymes: No results for input(s): "CKTOTAL", "CKMB", "CKMBINDEX", "TROPONINI" in the last 168 hours. BNP (last 3 results) No results for input(s): "PROBNP" in the last 8760 hours. HbA1C: No results for input(s): "HGBA1C" in the last 72 hours. CBG: No results for input(s): "GLUCAP" in the last 168 hours. Lipid Profile: No results for input(s): "CHOL", "HDL", "LDLCALC", "TRIG", "CHOLHDL", "LDLDIRECT" in the last 72 hours. Thyroid Function Tests: No results for input(s): "TSH", "T4TOTAL", "FREET4", "T3FREE", "THYROIDAB" in the last 72 hours. Anemia Panel: No results for input(s): "VITAMINB12", "FOLATE", "FERRITIN", "TIBC", "IRON", "RETICCTPCT" in the last 72 hours. Sepsis Labs: No results for input(s): "PROCALCITON", "LATICACIDVEN" in the last 168 hours.  Recent Results (from the past 240 hour(s))  Culture, body fluid w Gram Stain-bottle     Status: None   Collection Time: 08/21/22  7:16 PM   Specimen: Fluid  Result Value Ref Range Status   Specimen Description FLUID  Final   Special Requests NONE  Final   Culture   Final    NO GROWTH 5 DAYS Performed at [facility], [address] [place], [place] [address]    Report Status 08/27/2022 FINAL  Final  Gram stain      Status: None   Collection Time: 08/21/22  7:16 PM   Specimen: Fluid  Result Value Ref Range Status   Specimen Description FLUID  Final   Special Requests NONE  Final   Gram Stain   Final    FEW WBC PRESENT, PREDOMINANTLY PMN NO ORGANISMS SEEN Performed at [facility], [place] [address], [place], [place] [address]    Report Status 08/22/2022 FINAL  Final  Resp panel by RT-PCR (RSV, Flu A&B, Covid) Anterior Nasal Swab     Status: None   Collection Time: 08/21/22  7:41 PM   Specimen: Anterior Nasal Swab  Result Value Ref Range Status   SARS Coronavirus 2 by RT PCR NEGATIVE NEGATIVE Final    Comment: (NOTE) SARS-CoV-2 target nucleic acids are NOT DETECTED.  The SARS-CoV-2 RNA is generally detectable in upper respiratory specimens during the acute phase of infection. The lowest concentration of SARS-CoV-2 viral copies this assay can detect is 138 copies/mL. A negative result does not preclude SARS-Cov-2 infection and should not be used as the sole basis for treatment or other patient management decisions. A negative result may occur with  improper specimen collection/handling, submission of specimen other than nasopharyngeal swab, presence of viral mutation(s) within the areas targeted by this assay, and inadequate number of  viral copies(<138 copies/mL). A negative result must be combined with clinical observations, patient history, and epidemiological information. The expected result is Negative.  Fact Sheet for Patients:  EntrepreneurPulse.com.au  Fact Sheet for Healthcare Providers:  IncredibleEmployment.be  This test is no t yet approved or cleared by the [place] FDA and  has been authorized for detection and/or diagnosis of SARS-CoV-2 by FDA under an Emergency Use Authorization (EUA). This EUA will remain  in effect (meaning this test can be used) for the duration of the COVID-19 declaration under Section 564(b)(1) of the Act,  21 U.S.C.section 360bbb-3(b)(1), unless the authorization is terminated  or revoked sooner.       Influenza A by PCR NEGATIVE NEGATIVE Final   Influenza B by PCR NEGATIVE NEGATIVE Final    Comment: (NOTE) The Xpert Xpress SARS-CoV-2/FLU/RSV plus assay is intended as an aid in the diagnosis of influenza from Nasopharyngeal swab specimens and should not be used as a sole basis for treatment. Nasal washings and aspirates are unacceptable for Xpert Xpress SARS-CoV-2/FLU/RSV testing.  Fact Sheet for Patients: EntrepreneurPulse.com.au  Fact Sheet for Healthcare Providers: IncredibleEmployment.be  This test is not yet approved or cleared by the [place] FDA and has been authorized for detection and/or diagnosis of SARS-CoV-2 by FDA under an Emergency Use Authorization (EUA). This EUA will remain in effect (meaning this test can be used) for the duration of the COVID-19 declaration under Section 564(b)(1) of the Act, 21 U.S.C. section 360bbb-3(b)(1), unless the authorization is terminated or revoked.     Resp Syncytial Virus by PCR NEGATIVE NEGATIVE Final    Comment: (NOTE) Fact Sheet for Patients: EntrepreneurPulse.com.au  Fact Sheet for Healthcare Providers: IncredibleEmployment.be  This test is not yet approved or cleared by the [place] FDA and has been authorized for detection and/or diagnosis of SARS-CoV-2 by FDA under an Emergency Use Authorization (EUA). This EUA will remain in effect (meaning this test can be used) for the duration of the COVID-19 declaration under Section 564(b)(1) of the Act, 21 U.S.C. section 360bbb-3(b)(1), unless the authorization is terminated or revoked.  Performed at [facility], [place] [address], [place], [place] [address]   Culture, blood (routine x 2)     Status: None   Collection Time: 08/21/22  7:58 PM   Specimen: BLOOD  Result Value Ref  Range Status   Specimen Description   Final    BLOOD PORTA CATH Performed at [place] [address] [place], [place] [address]    Special Requests   Final    BOTTLES DRAWN AEROBIC AND ANAEROBIC Blood Culture adequate volume Performed at [place] [address], [place], [place] [address]    Culture   Final    NO GROWTH 5 DAYS Performed at [facility], [place] [address] [place], [place] [address]    Report Status 08/26/2022 FINAL  Final  Culture, blood (routine x 2)     Status: None   Collection Time: 08/21/22  7:58 PM   Specimen: BLOOD LEFT ARM  Result Value Ref Range Status   Specimen Description   Final    BLOOD LEFT ARM Performed at [facility], [place] [address] [place], [place] [address]    Special Requests   Final    BOTTLES DRAWN AEROBIC AND ANAEROBIC Blood Culture adequate volume Performed at [place] [address], [place], [place] [address]    Culture   Final    NO  GROWTH 5 DAYS Performed at [facility], [place] [address] [place], [place] [address]    Report Status 08/26/2022 FINAL  Final  Fungus Culture With Stain     Status: None (Preliminary result)   Collection Time: 08/22/22 10:02 AM   Specimen: PATH Cytology Pleural fluid  Result Value Ref Range Status   Fungus Stain Final report  Final    Comment: (NOTE) Performed At: [facility] [place], [place] [id number] Rush Farmer MD [id number]    Fungus (Mycology) Culture PENDING  Incomplete   Fungal Source PLEURAL  Final    Comment: Performed at [facility], [place] [address], [place], [place] [address]  Fungus Culture Result     Status: None   Collection Time: 08/22/22 10:02 AM  Result Value Ref Range Status   Result 1 Comment  Final    Comment: (NOTE) KOH/Calcofluor preparation:  no fungus observed. Performed At: [facility] [place], [place]  [id number] Rush Farmer MD [id number]          Radiology Studies: DG CHEST PORT 1 VIEW  Result Date: 08/29/2022 CLINICAL DATA:  57 year old female history of right-sided pleural effusion. EXAM: PORTABLE CHEST 1 VIEW COMPARISON:  Chest x-ray 08/28/2022. FINDINGS: Right internal jugular single-lumen power porta cath with tip terminating at the superior cavoatrial junction. Previously noted pigtail drainage catheter has been removed. Diffuse interstitial prominence and peribronchial cuffing with widespread but patchy asymmetrically distributed airspace consolidation in the lungs bilaterally (right greater than left), most evident in the right mid to upper lung, similar to the prior study. No layering pleural effusion (although some fluid in the right major fissure may be present). No pneumothorax. No evidence of pulmonary edema. Heart size is normal. IMPRESSION: 1. Support apparatus, as above. 2. There may be some residual fluid in the right major fissure, but no layering pleural effusion is identified at this time. 3. 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 1. Electronically Signed   By: Vinnie Langton M.D.   On: 08/29/2022 08:28   DG CHEST PORT 1 VIEW  Result Date: 08/28/2022 CLINICAL DATA:  Right-sided pleural effusion EXAM: PORTABLE CHEST 1 VIEW COMPARISON:  Prior chest x-ray 08/27/2022 FINDINGS: Right IJ approach single-lumen power injectable port catheter remains in good position with the tip overlying the cavoatrial junction. Right-sided pigtail thoracostomy tube in unchanged position. Interval resolution of pneumothorax. Persistent small right-sided pleural effusion. Stable appearance of the lungs with right suprahilar mass, interstitial spread of disease throughout the right lung and left nodular airspace opacity. IMPRESSION: 1. Interval resolution of pneumothorax. 2. Small right-sided pleural effusion. 3. Stable  support apparatus. 4. Stable known right upper lobe mass. Electronically Signed   By: Jacqulynn Cadet M.D.   On: 08/28/2022 08:43   DG CHEST PORT 1 VIEW  Result Date: 08/27/2022 CLINICAL DATA:  Pneumothorax on right side. EXAM: PORTABLE CHEST 1 VIEW COMPARISON:  Radiograph earlier today., Additional radiographs reviewed. CT 08/21/2022 FINDINGS: Right-sided pneumothorax is unchanged in size paralleling the posterior third rib, 17 mm from the under surface of the first rib. Pigtail catheter remains in place. Accessed chest port is again seen. Stable heart size and mediastinal contours with unchanged right suprahilar masslike opacity. Interstitial opacities diffusely are stable. No new abnormalities. IMPRESSION: 1. Unchanged size of small right pneumothorax. Right pigtail catheter remains in place. 2. The exam is otherwise unchanged. Electronically Signed   By: Keith Rake  M.D.   On: 08/27/2022 16:31   DG CHEST PORT 1 VIEW  Result Date: 08/27/2022 CLINICAL DATA:  Follow up pneumothorax.  Right chest tube clamping. EXAM: PORTABLE CHEST 1 VIEW COMPARISON:  Radiographs earlier the same date, 08/26/2022 and 08/25/2022. FINDINGS: 1025 hours. Right IJ Port-A-Cath extends to the superior cavoatrial junction. Pigtail chest tube peripherally in the right pleural space is unchanged. Small right apical pneumothorax is unchanged. The heart size and mediastinal contours are stable with known right paratracheal and bilateral hilar adenopathy. Unchanged right greater than left interstitial and airspace opacities in both lungs. No significant pleural effusion. No acute osseous findings. There is a small amount of soft tissue emphysema within the right lateral chest wall. IMPRESSION: 1. Stable small right apical pneumothorax following chest tube clamping. No significant change in bilateral interstitial and airspace opacities. 2. Stable mediastinal and hilar adenopathy. Electronically Signed   By: Richardean Sale M.D.   On:  08/27/2022 11:09        Scheduled Meds:  aspirin EC  81 mg Oral Daily   Chlorhexidine Gluconate Cloth  6 each Topical Daily   clonazePAM  1 mg Oral BID   clopidogrel  75 mg Oral Daily   dexamethasone (DECADRON) injection  8 mg Intravenous Daily   diclofenac Sodium  4 g Topical QID   dronabinol  5 mg Oral BID AC   enoxaparin (LOVENOX) injection  40 mg Subcutaneous Q24H   feeding supplement  1 Container Oral TID BM   fentaNYL  1 patch Transdermal [id number]   folic acid  1 mg Oral Daily   metoCLOPramide (REGLAN) injection  10 mg Intravenous Q8H   multivitamin with minerals  1 tablet Oral Daily   OLANZapine zydis  10 mg Oral Daily   pantoprazole  20 mg Oral Daily   rosuvastatin  10 mg Oral Daily   senna  1 tablet Oral QHS   sertraline  100 mg Oral BID   traZODone  150 mg Oral QHS   Vitamin D (Ergocalciferol)  50,000 Units Oral Weekly   Continuous Infusions:  lactated ringers 1,000 mL with potassium chloride 40 mEq infusion 75 mL/hr at 08/27/22 1038   ondansetron 8 mg (08/28/22 2209)          Aline August, MD [facility] 08/29/2022, 9:39 AM

## 2022-08-29 NOTE — Progress Notes (Signed)
Brief PCCM Progress Note  # Right malignant pleural effusion - pocket of fluid is mostly posterior to posterior axillary line when she's left lateral decubitus and upright - not ideal for PleurX placement. Can reassess adequacy for pocket tomorrow. Can be considered on outpatient basis if she is ready for dc otherwise tomorrow - there will be greater than average risk for bleeding on plavix but I'd hate to interrupt course this early post stroke and she tolerated pigtail placement without bleeding.  Will follow  [place]

## 2022-08-30 ENCOUNTER — Other Ambulatory Visit (HOSPITAL_COMMUNITY): Payer: Self-pay

## 2022-08-30 ENCOUNTER — Other Ambulatory Visit: Payer: Self-pay

## 2022-08-30 ENCOUNTER — Telehealth: Payer: Self-pay | Admitting: Internal Medicine

## 2022-08-30 DIAGNOSIS — C3490 Malignant neoplasm of unspecified part of unspecified bronchus or lung: Secondary | ICD-10-CM | POA: Diagnosis not present

## 2022-08-30 DIAGNOSIS — J910 Malignant pleural effusion: Secondary | ICD-10-CM | POA: Diagnosis present

## 2022-08-30 DIAGNOSIS — R112 Nausea with vomiting, unspecified: Secondary | ICD-10-CM | POA: Diagnosis not present

## 2022-08-30 DIAGNOSIS — Z515 Encounter for palliative care: Secondary | ICD-10-CM | POA: Diagnosis not present

## 2022-08-30 DIAGNOSIS — J90 Pleural effusion, not elsewhere classified: Secondary | ICD-10-CM | POA: Diagnosis not present

## 2022-08-30 DIAGNOSIS — G893 Neoplasm related pain (acute) (chronic): Secondary | ICD-10-CM | POA: Diagnosis not present

## 2022-08-30 LAB — BASIC METABOLIC PANEL
Anion gap: 7 (ref 5–15)
BUN: 10 mg/dL (ref 6–20)
CO2: 22 mmol/L (ref 22–32)
Calcium: 7.5 mg/dL — ABNORMAL LOW (ref 8.9–10.3)
Chloride: 100 mmol/L (ref 98–111)
Creatinine, Ser: 0.66 mg/dL (ref 0.44–1.00)
GFR, Estimated: >60 mL/min (ref >60)
Glucose, Bld: 96 mg/dL (ref 70–99)
Potassium: 3.6 mmol/L (ref 3.5–5.1)
Sodium: 129 mmol/L — ABNORMAL LOW (ref 135–145)

## 2022-08-30 LAB — CBC WITH DIFFERENTIAL/PLATELET
Abs Immature Granulocytes: 0.08 10*3/uL — ABNORMAL HIGH (ref 0.00–0.07)
Basophils Absolute: 0 10*3/uL (ref 0.0–0.1)
Basophils Relative: 0 %
Eosinophils Absolute: 0.7 10*3/uL — ABNORMAL HIGH (ref 0.0–0.5)
Eosinophils Relative: 4 %
HCT: 32.2 % — ABNORMAL LOW (ref 36.0–46.0)
Hemoglobin: 11.1 g/dL — ABNORMAL LOW (ref 12.0–15.0)
Immature Granulocytes: 1 %
Lymphocytes Relative: 11 %
Lymphs Abs: 1.9 10*3/uL (ref 0.7–4.0)
MCH: 31.4 pg (ref 26.0–34.0)
MCHC: 34.5 g/dL (ref 30.0–36.0)
MCV: 91.2 fL (ref 80.0–100.0)
Monocytes Absolute: 1.3 10*3/uL — ABNORMAL HIGH (ref 0.1–1.0)
Monocytes Relative: 8 %
Neutro Abs: 12.8 10*3/uL — ABNORMAL HIGH (ref 1.7–7.7)
Neutrophils Relative %: 76 %
Platelets: 281 10*3/uL (ref 150–400)
RBC: 3.53 MIL/uL — ABNORMAL LOW (ref 3.87–5.11)
RDW: 12.5 % (ref 11.5–15.5)
WBC: 16.7 10*3/uL — ABNORMAL HIGH (ref 4.0–10.5)
nRBC: 0 % (ref 0.0–0.2)

## 2022-08-30 LAB — MAGNESIUM: Magnesium: 1.8 mg/dL (ref 1.7–2.4)

## 2022-08-30 MED ORDER — TRAMETINIB DIMETHYL SULFOXIDE 2 MG PO TABS: 2.0000 mg | ORAL_TABLET | Freq: Every day | ORAL | Status: DC

## 2022-08-30 MED ORDER — TRAMETINIB DIMETHYL SULFOXIDE 2 MG PO TABS
2.0000 mg | ORAL_TABLET | Freq: Every day | ORAL | Status: DC
  Administered 2022-08-31 – 2022-09-06 (×7): 2 mg via ORAL

## 2022-08-30 MED ORDER — DABRAFENIB MESYLATE 75 MG PO CAPS
150.0000 mg | ORAL_CAPSULE | Freq: Two times a day (BID) | ORAL | Status: DC
  Administered 2022-08-30 – 2022-09-06 (×14): 150 mg via ORAL

## 2022-08-30 NOTE — Progress Notes (Signed)
   NAME:  Karen [name], MRN:  [id number], DOB:  12-07-1965, LOS: 8 ADMISSION DATE:  08/21/2022, CONSULTATION DATE:  08/24/22 REFERRING MD:  Sloan Leiter, CHIEF COMPLAINT:  dyspnea   History of Present Illness:  77yF with recently discovered stage IV NSCLC with mets to bone sp first course of palliative chemo, vocal cord paralysis, latent TB, recent lacunar infarct who has had intractable nausea, vomiting, chest pain, dyspnea since starting treatment. She was found to have R pleural effusion and underwent thora 3/3 c/b enlarging hydropneumothorax now s/p IR pigtail placement 3/5.   PMT following.  Pertinent  Medical History  Stage IV NSCLC with mets to bone Vocal cord paralysis Latent TB Lacunar infarct  Significant Hospital Events: Including procedures, antibiotic start and stop dates in addition to other pertinent events   3/3 thoracentesis with 400 cc removed 3/5 IR pigtail for enlarging hydropneumothorax  Interim History / Subjective:  No overnight issues. Reports her breathing is somewhat difficult.   Objective   Blood pressure 129/86, pulse (!) 106, temperature 99.2 F (37.3 C), temperature source Oral, resp. rate 16, height 5\' 2"  (1.575 m), weight 45.8 kg, last menstrual period 02/08/2006, SpO2 90 %.        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  - no substantial pleural effusion pocket for pleurx placement.  - would not want to  interrupt her anticoagulation within 30 days of TIA and left parietal infarct. Looks like she was recommended to have 3 weeks DAPT and then asa alone based on review of neurology notes.  - will have her follow up with [place] in the office to check on effusion.  - this was communicated to the patient via interpreter as well as primary team attending.  Lenice Llamas, MD Pulmonary and [place] 08/30/2022 12:44 PM Pager: see AMION  If no response to pager, please call critical care on call (see AMION) until 7pm After 7:00 pm call [place]

## 2022-08-30 NOTE — Telephone Encounter (Signed)
Oral Chemotherapy Pharmacist Encounter    Called with interpreter (ID: [id number]) to talk with patient regarding new medications Tafinlar and Mekinist for the treatment of metastatic NSCLC, BRAF mutated, planned duration until disease progression or unacceptable drug toxicity.   Patient's spouse answered the phone as at the time of calling patient was actively vomiting. Will plan to meet with patient and patient's husband in person on 08/31/22 at 30 to discuss details of [place] and [place]. Patient's husband made aware that the [place] Myrtle has been delivered to the inpatient pharmacy and patient will receive first dose of that tonight at 43, and then Mekinist will be started 08/31/22 AM at 1000.   No further questions or concerns at this time. Oral Oncology Clinic will continue to follow.   Leron Croak, PharmD, BCPS, [facility] Hematology/Oncology Clinical Pharmacist [facility] and [place] [phone] 08/30/2022 4:01 PM

## 2022-08-30 NOTE — Telephone Encounter (Signed)
Please schedule 4 week follow up with Dr. Verlee Monte for pleural effusion. Note - patient is spanish speaking will need interpreter.

## 2022-08-30 NOTE — Progress Notes (Addendum)
PROGRESS NOTE    Karen [name]  [id number] DOB: [birth year] DOA: 08/21/2022 PCP: Starlyn Skeans, MD   Brief Narrative:  57 year old female with history of non-small cell lung cancer on chemotherapy, chronic back pain and hypertension who underwent first round of induction chemotherapy with carboplatin and Alimta on 08/18/22 and since then started having intractable nausea, vomiting chest pain and shortness of breath and unable to tolerate oral medications and diet and subsequently admitted.  She was found to have sided pleural effusion which was drained resulting in pneumothorax which was managed by pigtail catheter placement on 08/24/2022 by IR.  Pulmonary and palliative care were consulted.  Pigtail catheter removed on 08/28/2022.  Assessment & Plan:   Intractable nausea and vomiting secondary to chemotherapy Severe cancer related pain Major depression -Oncology evaluated the patient during this hospitalization. -Palliative care following and helping with symptom management: Continue Decadron, olanzapine, Protonix, Marinol, Reglan along with as needed Compazine and Ativan. -Continue fentanyl patch and as needed Norco.  Continue bowel regimen. -Encourage oral intake.  Off IV fluids.    Right-sided pleural effusion status post thoracentesis with resultant right-sided hydropneumothorax -Pulmonary following.  Pigtail catheter placed on 08/24/2022 by IR and subsequently removed by pulmonary on 08/28/2022.  Follow further pulmonary recommendations.  Might need Pleurx catheter prior to discharge.  Metastatic non-small cell lung cancer on chemotherapy Goals of care -Oncology evaluated the patient during this hospitalization.  Outpatient follow-up with oncology. -Overall prognosis is guarded to poor.  Currently remains full code: Palliative care following  Leukocytosis -WBCs worsening today.  Possibly from Decadron use.  Monitor  Hyponatremia -Sodium 129 today.  Monitor.  Encourage oral  intake.  History of ischemic stroke in 07/2022 Hyperlipidemia -Continue Aspirin, plavix and statin  Possible anemia of chronic disease -From lung cancer.  Monitor intermittently  Physical deconditioning -PT eval  DVT prophylaxis: Lovenox Code Status: Full Family Communication: None at bedside Disposition Plan: Status is: Inpatient Remains inpatient appropriate because: Of severity of illness  Consultants: IR/oncology/pulmonary/palliative care  Procedures: As above  Antimicrobials: None   Subjective: Patient seen and examined at bedside.  Does not feel well; feels weak with intermittent nausea and cough.  No fever, chest pain reported. Objective: Vitals:   08/29/22 0620 08/29/22 1322 08/29/22 1950 08/30/22 0451  BP: (!) 140/89 134/89 (!) 120/91 129/86  Pulse: (!) 101 (!) 109 (!) 108 (!) 106  Resp:  '20 18 16  '$ Temp: 98.5 F (36.9 C) 99.2 F (37.3 C) 98.7 F (37.1 C) 99.2 F (37.3 C)  TempSrc: Oral Oral Oral Oral  SpO2: 96% 93% 95% 90%  Weight:      Height:        Intake/Output Summary (Last 24 hours) at 08/30/2022 0815 Last data filed at 08/29/2022 1930 Gross per 24 hour  Intake 120 ml  Output --  Net 120 ml    Filed Weights   08/21/22 1919 08/22/22 0146  Weight: 46.3 kg 45.8 kg    Examination:  General: On room air.  No distress.  Looks chronically ill and deconditioned ENT/neck: No thyromegaly.  JVD is not elevated  respiratory: Decreased breath sounds at bases bilaterally with some crackles; no wheezing  CVS: S1-S2 heard, still tachycardic  abdominal: Soft, nontender, slightly distended; no organomegaly, bowel sounds are heard Extremities: Trace lower extremity edema; no cyanosis  CNS: Awake and alert.  Still slow to respond and a poor historian.  No focal neurologic deficit.  Moves extremities Lymph: No obvious lymphadenopathy Skin: No obvious ecchymosis/lesions  psych: Extremely flat affect.  Showing no signs of agitation  currently. musculoskeletal: No obvious joint swelling/deformity     Data Reviewed: I have personally reviewed following labs and imaging studies  CBC: Recent Labs  Lab 08/25/22 0548 08/30/22 0500  WBC 12.6* 16.7*  NEUTROABS 8.7* 12.8*  HGB 12.2 11.1*  HCT 36.1 32.2*  MCV 90.9 91.2  PLT 297 [id number]    Basic Metabolic Panel: Recent Labs  Lab 08/25/22 0548 08/30/22 0500  NA 131* 129*  K 3.6 3.6  CL 101 100  CO2 23 22  GLUCOSE 117* 96  BUN 9 10  CREATININE 0.57 0.66  CALCIUM 7.5* 7.5*  MG 2.1 1.8  PHOS 1.9*  --     GFR: Estimated Creatinine Clearance: 56.8 mL/min (by C-G formula based on SCr of 0.66 mg/dL). Liver Function Tests: Recent Labs  Lab 08/25/22 0548  AST 19  ALT 33  ALKPHOS 96  BILITOT 0.7  PROT 6.7  ALBUMIN 3.4*    No results for input(s): "LIPASE", "AMYLASE" in the last 168 hours. No results for input(s): "AMMONIA" in the last 168 hours. Coagulation Profile: No results for input(s): "INR", "PROTIME" in the last 168 hours. Cardiac Enzymes: No results for input(s): "CKTOTAL", "CKMB", "CKMBINDEX", "TROPONINI" in the last 168 hours. BNP (last 3 results) No results for input(s): "PROBNP" in the last 8760 hours. HbA1C: No results for input(s): "HGBA1C" in the last 72 hours. CBG: No results for input(s): "GLUCAP" in the last 168 hours. Lipid Profile: No results for input(s): "CHOL", "HDL", "LDLCALC", "TRIG", "CHOLHDL", "LDLDIRECT" in the last 72 hours. Thyroid Function Tests: No results for input(s): "TSH", "T4TOTAL", "FREET4", "T3FREE", "THYROIDAB" in the last 72 hours. Anemia Panel: No results for input(s): "VITAMINB12", "FOLATE", "FERRITIN", "TIBC", "IRON", "RETICCTPCT" in the last 72 hours. Sepsis Labs: No results for input(s): "PROCALCITON", "LATICACIDVEN" in the last 168 hours.  Recent Results (from the past 240 hour(s))  Culture, body fluid w Gram Stain-bottle     Status: None   Collection Time: 08/21/22  7:16 PM   Specimen: Fluid   Result Value Ref Range Status   Specimen Description FLUID  Final   Special Requests NONE  Final   Culture   Final    NO GROWTH 5 DAYS Performed at [facility], [address] [place], [place] [address]    Report Status 08/27/2022 FINAL  Final  Gram stain     Status: None   Collection Time: 08/21/22  7:16 PM   Specimen: Fluid  Result Value Ref Range Status   Specimen Description FLUID  Final   Special Requests NONE  Final   Gram Stain   Final    FEW WBC PRESENT, PREDOMINANTLY PMN NO ORGANISMS SEEN Performed at [facility], [place] [address] [place], [place] [address]    Report Status 08/22/2022 FINAL  Final  Resp panel by RT-PCR (RSV, Flu A&B, Covid) Anterior Nasal Swab     Status: None   Collection Time: 08/21/22  7:41 PM   Specimen: Anterior Nasal Swab  Result Value Ref Range Status   SARS Coronavirus 2 by RT PCR NEGATIVE NEGATIVE Final    Comment: (NOTE) SARS-CoV-2 target nucleic acids are NOT DETECTED.  The SARS-CoV-2 RNA is generally detectable in upper respiratory specimens during the acute phase of infection. The lowest concentration of SARS-CoV-2 viral copies this assay can detect is 138 copies/mL. A negative result does not preclude SARS-Cov-2 infection and should not be used as the sole basis for treatment  or other patient management decisions. A negative result may occur with  improper specimen collection/handling, submission of specimen other than nasopharyngeal swab, presence of viral mutation(s) within the areas targeted by this assay, and inadequate number of viral copies(<138 copies/mL). A negative result must be combined with clinical observations, patient history, and epidemiological information. The expected result is Negative.  Fact Sheet for Patients:  EntrepreneurPulse.com.au  Fact Sheet for Healthcare Providers:  IncredibleEmployment.be  This test is no t yet approved or cleared by the [place] FDA and  has been authorized for detection and/or diagnosis of SARS-CoV-2 by FDA under an Emergency Use Authorization (EUA). This EUA will remain  in effect (meaning this test can be used) for the duration of the COVID-19 declaration under Section 564(b)(1) of the Act, 21 U.S.C.section 360bbb-3(b)(1), unless the authorization is terminated  or revoked sooner.       Influenza A by PCR NEGATIVE NEGATIVE Final   Influenza B by PCR NEGATIVE NEGATIVE Final    Comment: (NOTE) The Xpert Xpress SARS-CoV-2/FLU/RSV plus assay is intended as an aid in the diagnosis of influenza from Nasopharyngeal swab specimens and should not be used as a sole basis for treatment. Nasal washings and aspirates are unacceptable for Xpert Xpress SARS-CoV-2/FLU/RSV testing.  Fact Sheet for Patients: EntrepreneurPulse.com.au  Fact Sheet for Healthcare Providers: IncredibleEmployment.be  This test is not yet approved or cleared by the [place] FDA and has been authorized for detection and/or diagnosis of SARS-CoV-2 by FDA under an Emergency Use Authorization (EUA). This EUA will remain in effect (meaning this test can be used) for the duration of the COVID-19 declaration under Section 564(b)(1) of the Act, 21 U.S.C. section 360bbb-3(b)(1), unless the authorization is terminated or revoked.     Resp Syncytial Virus by PCR NEGATIVE NEGATIVE Final    Comment: (NOTE) Fact Sheet for Patients: EntrepreneurPulse.com.au  Fact Sheet for Healthcare Providers: IncredibleEmployment.be  This test is not yet approved or cleared by the [place] FDA and has been authorized for detection and/or diagnosis of SARS-CoV-2 by FDA under an Emergency Use Authorization (EUA). This EUA will remain in effect (meaning this test can be used) for the duration of the COVID-19 declaration under Section 564(b)(1) of the Act, 21 U.S.C. section  360bbb-3(b)(1), unless the authorization is terminated or revoked.  Performed at [facility], [place] [address], [place], [place] [address]   Culture, blood (routine x 2)     Status: None   Collection Time: 08/21/22  7:58 PM   Specimen: BLOOD  Result Value Ref Range Status   Specimen Description   Final    BLOOD PORTA CATH Performed at [place] [address] [place], [place] [address]    Special Requests   Final    BOTTLES DRAWN AEROBIC AND ANAEROBIC Blood Culture adequate volume Performed at [place] [address], [place], [place] [address]    Culture   Final    NO GROWTH 5 DAYS Performed at [facility], [place] [address] [place], [place] [address]    Report Status 08/26/2022 FINAL  Final  Culture, blood (routine x 2)     Status: None   Collection Time: 08/21/22  7:58 PM   Specimen: BLOOD LEFT ARM  Result Value Ref Range Status   Specimen Description   Final    BLOOD LEFT ARM Performed at [facility], [place] [address], [place],  [address]    Special Requests  Final    BOTTLES DRAWN AEROBIC AND ANAEROBIC Blood Culture adequate volume Performed at [place] [address], [place], [place] [address]    Culture   Final    NO GROWTH 5 DAYS Performed at [facility], [place] [address] [place], [place] [address]    Report Status 08/26/2022 FINAL  Final  Fungus Culture With Stain     Status: None (Preliminary result)   Collection Time: 08/22/22 10:02 AM   Specimen: PATH Cytology Pleural fluid  Result Value Ref Range Status   Fungus Stain Final report  Final    Comment: (NOTE) Performed At: [facility] [place], [place] [id number] Rush Farmer MD [id number]    Fungus (Mycology) Culture PENDING  Incomplete   Fungal Source PLEURAL  Final    Comment: Performed at [facility], [place] [address], [place], [place]  [address]  Fungus Culture Result     Status: None   Collection Time: 08/22/22 10:02 AM  Result Value Ref Range Status   Result 1 Comment  Final    Comment: (NOTE) KOH/Calcofluor preparation:  no fungus observed. Performed At: [facility] [place], [place] [id number] Rush Farmer MD [id number]          Radiology Studies: DG CHEST PORT 1 VIEW  Result Date: 08/29/2022 CLINICAL DATA:  57 year old female history of right-sided pleural effusion. EXAM: PORTABLE CHEST 1 VIEW COMPARISON:  Chest x-ray 08/28/2022. FINDINGS: Right internal jugular single-lumen power porta cath with tip terminating at the superior cavoatrial junction. Previously noted pigtail drainage catheter has been removed. Diffuse interstitial prominence and peribronchial cuffing with widespread but patchy asymmetrically distributed airspace consolidation in the lungs bilaterally (right greater than left), most evident in the right mid to upper lung, similar to the prior study. No layering pleural effusion (although some fluid in the right major fissure may be present). No pneumothorax. No evidence of pulmonary edema. Heart size is normal. IMPRESSION: 1. Support apparatus, as above. 2. There may be some residual fluid in the right major fissure, but no layering pleural effusion is identified at this time. 3. 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 1. Electronically Signed   By: Vinnie Langton M.D.   On: 08/29/2022 08:28        Scheduled Meds:  aspirin EC  81 mg Oral Daily   Chlorhexidine Gluconate Cloth  6 each Topical Daily   clonazePAM  1 mg Oral BID   clopidogrel  75 mg Oral Daily   dexamethasone (DECADRON) injection  8 mg Intravenous Daily   diclofenac Sodium  4 g Topical QID   dronabinol  5 mg Oral BID AC   enoxaparin (LOVENOX) injection  40 mg Subcutaneous Q24H   feeding supplement  1 Container Oral TID BM   fentaNYL   1 patch Transdermal [id number]   folic acid  1 mg Oral Daily   metoCLOPramide (REGLAN) injection  10 mg Intravenous Q8H   multivitamin with minerals  1 tablet Oral Daily   OLANZapine zydis  10 mg Oral Daily   pantoprazole  20 mg Oral Daily   rosuvastatin  10 mg Oral Daily   senna  1 tablet Oral QHS   sertraline  100 mg Oral BID   traZODone  150 mg Oral QHS   Vitamin D (Ergocalciferol)  50,000 Units Oral Weekly   Continuous Infusions:  ondansetron 8 mg (08/29/22 2136)  Aline August, MD [facility] 08/30/2022, 8:15 AM

## 2022-08-30 NOTE — Evaluation (Signed)
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  She is s/p thoracentesis with resultant R sided hydropneumothorax. Pigtail catheter placed 08/24/22 and removed 08/28/22. 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  Clinical Impression  Pt admitted with above diagnosis. Up until the last couple of months, pt was independent and working.  Since that time she has became weaker and fatigues very easily and mobility becoming difficult.  She was still ambulatory without AD.  After CVA, pt reports no focal deficits and that sensation has resolved.  She lives with spouse who works during day and she lives in [address]rd floor apartment.  Today, pt requiring min guard for safety and only able to ambulate 20'  with min guard.  She fatigued easily and reports shortness of breath. VSS on RA.  Pt hopeful to progress with acute PT , but would benefit from HHPT.  Could also benefit from [facility] as she easily fatigues and is home alone at times.  Potentially could benefit from RW or rollator - needs further assessment to determine which is best. Pt currently with functional limitations due to the deficits listed below (see PT Problem List). Pt will benefit from skilled PT to increase their independence and safety with mobility to allow discharge to the venue listed below.          Recommendations for follow up therapy are one component of a multi-disciplinary discharge planning process, led by the attending physician.  Recommendations may be updated based on patient status, additional functional criteria and insurance authorization.  Follow Up Recommendations Home health PT      Assistance Recommended at Discharge Intermittent  Supervision/Assistance  Patient can return home with the following  A little help with walking and/or transfers;A little help with bathing/dressing/bathroom;Help with stairs or ramp for entrance;Assistance with cooking/housework    Equipment Recommendations BSC/3in1 (rollator vs RW)  Recommendations for Other Services       Functional Status Assessment Patient has had a recent decline in their functional status and demonstrates the ability to make significant improvements in function in a reasonable and predictable amount of time.     Precautions / Restrictions Precautions Precautions: Fall Precaution Comments: min fall risk Restrictions Weight Bearing Restrictions: No      Mobility  Bed Mobility Overal bed mobility: Needs Assistance Bed Mobility: Supine to Sit, Sit to Supine     Supine to sit: Modified independent (Device/Increase time) Sit to supine: Modified independent (Device/Increase time)        Transfers Overall transfer level: Needs assistance Equipment used: None Transfers: Sit to/from Stand, Bed to chair/wheelchair/BSC Sit to Stand: Min guard Stand pivot transfers: Min guard         General transfer comment: min guard for safety; sit to stand x 2; stand pivot x 1    Ambulation/Gait Ambulation/Gait assistance: Min guard Gait Distance (Feet): 20 Feet Assistive device: None Gait Pattern/deviations: Step-to pattern, Decreased stride length Gait velocity: decreased     General Gait Details: min guard for stabilty; fatigued easily  Stairs            Wheelchair Mobility    Modified Rankin (Stroke Patients Only)       Balance Overall balance assessment: Needs assistance Sitting-balance support: No  upper extremity supported Sitting balance-Leahy Scale: Good     Standing balance support: No upper extremity supported Standing balance-Leahy Scale: Fair                               Pertinent Vitals/Pain Pain Assessment Pain  Assessment: Faces Faces Pain Scale: Hurts a little bit Pain Location: generalized Pain Descriptors / Indicators: Discomfort, Grimacing Pain Intervention(s): Monitored during session, Repositioned    Home Living Family/patient expects to be discharged to:: Private residence Living Arrangements: Spouse/significant other Available Help at Discharge: Family;Available PRN/intermittently Type of Home: Apartment Home Access: Stairs to enter Entrance Stairs-Rails: Right Entrance Stairs-Number of Steps: 3 flights of steps   Home Layout: One level Home Equipment: None      Prior Function               Mobility Comments: Pt reports working and able to ambulate in community up until the past couple of months.  Since that time fatigues very easily and activity is difficult.  She was still ambulating in apartment but spouse assisted with getting up stairs.  Since CVA - no focal deficits, just easily fatigued, reports sensation resolved ADLs Comments: Pt was indenpendent with ADLS but fatigued easily; spouse assisting with IADLs last couple of months     Hand Dominance   Dominant Hand: Right    Extremity/Trunk Assessment   Upper Extremity Assessment Upper Extremity Assessment: LUE deficits/detail;RUE deficits/detail RUE Deficits / Details: ROM WFL; MMT 4-/5 RUE Sensation: WNL LUE Deficits / Details: ROM WFL; MMT 4-/5 LUE Sensation: WNL    Lower Extremity Assessment Lower Extremity Assessment: LLE deficits/detail;RLE deficits/detail RLE Deficits / Details: ROM WFL; MMT 4-/5 RLE Sensation: WNL LLE Deficits / Details: ROM WFL; MMT 4-/5 LLE Sensation: WNL    Cervical / Trunk Assessment Cervical / Trunk Assessment: Normal  Communication   Communication: Interpreter utilized;Prefers language other than English [place]ta Fortis [phone])  Cognition Arousal/Alertness: Awake/alert Behavior During Therapy: WFL for tasks assessed/performed Overall Cognitive Status: Within Functional Limits for  tasks assessed                                          General Comments General comments (skin integrity, edema, etc.): VSS on RA    Exercises     Assessment/Plan    PT Assessment Patient needs continued PT services  PT Problem List Decreased mobility;Decreased activity tolerance;Cardiopulmonary status limiting activity;Decreased strength;Decreased knowledge of use of DME;Decreased balance       PT Treatment Interventions Functional mobility training;Patient/family education;Gait training;Therapeutic activities;Stair training;DME instruction;Therapeutic exercise;Balance training    PT Goals (Current goals can be found in the Care Plan section)  Acute Rehab PT Goals Patient Stated Goal: more energy PT Goal Formulation: With patient Time For Goal Achievement: 09/13/22 Potential to Achieve Goals: Fair    Frequency Min 3X/week     Co-evaluation               AM-PAC PT "6 Clicks" Mobility  Outcome Measure Help needed turning from your back to your side while in a flat bed without using bedrails?: None Help needed moving from lying on your back to sitting on the side of a flat bed without using bedrails?: A Little Help needed moving to and from a bed to a chair (including a wheelchair)?: A Little Help needed standing up from  a chair using your arms (e.g., wheelchair or bedside chair)?: A Little Help needed to walk in hospital room?: A Little Help needed climbing 3-5 steps with a railing? : A Lot 6 Click Score: 18    End of Session Equipment Utilized During Treatment: Gait belt Activity Tolerance: Patient tolerated treatment well Patient left: in bed;with call bell/phone within reach;with bed alarm set Nurse Communication: Mobility status PT Visit Diagnosis: Difficulty in walking, not elsewhere classified (R26.2);Unsteadiness on feet (R26.81)    Time: [id number] PT Time Calculation (min) (ACUTE ONLY): 28 min   Charges:   PT Evaluation $PT Eval  Low Complexity: 1 Low PT Treatments $Therapeutic Activity: 8-22 mins        Abran Richard, PT Acute Rehab [facility] [phone]   Karlton Lemon 08/30/2022, 12:15 PM

## 2022-08-30 NOTE — Progress Notes (Signed)
Patient has nose bleed. Had her apply pressure and lean forward for 10 minutes. Put rolled gauze into nare. Notified Dr.
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
## 2022-08-31 ENCOUNTER — Inpatient Hospital Stay (HOSPITAL_COMMUNITY): Payer: Commercial Managed Care - HMO

## 2022-08-31 ENCOUNTER — Encounter: Payer: Self-pay | Admitting: Hematology

## 2022-08-31 ENCOUNTER — Telehealth: Payer: Self-pay | Admitting: Pharmacist

## 2022-08-31 DIAGNOSIS — J90 Pleural effusion, not elsewhere classified: Secondary | ICD-10-CM | POA: Diagnosis not present

## 2022-08-31 DIAGNOSIS — C3490 Malignant neoplasm of unspecified part of unspecified bronchus or lung: Secondary | ICD-10-CM | POA: Diagnosis not present

## 2022-08-31 DIAGNOSIS — R112 Nausea with vomiting, unspecified: Secondary | ICD-10-CM | POA: Diagnosis not present

## 2022-08-31 DIAGNOSIS — G893 Neoplasm related pain (acute) (chronic): Secondary | ICD-10-CM | POA: Diagnosis not present

## 2022-08-31 DIAGNOSIS — Z515 Encounter for palliative care: Secondary | ICD-10-CM | POA: Diagnosis not present

## 2022-08-31 DIAGNOSIS — J910 Malignant pleural effusion: Secondary | ICD-10-CM | POA: Diagnosis not present

## 2022-08-31 LAB — CBC WITH DIFFERENTIAL/PLATELET
Abs Immature Granulocytes: 0.09 10*3/uL — ABNORMAL HIGH (ref 0.00–0.07)
Basophils Absolute: 0 10*3/uL (ref 0.0–0.1)
Basophils Relative: 0 %
Eosinophils Absolute: 0.3 10*3/uL (ref 0.0–0.5)
Eosinophils Relative: 2 %
HCT: 33 % — ABNORMAL LOW (ref 36.0–46.0)
Hemoglobin: 11.1 g/dL — ABNORMAL LOW (ref 12.0–15.0)
Immature Granulocytes: 1 %
Lymphocytes Relative: 4 %
Lymphs Abs: 0.7 10*3/uL (ref 0.7–4.0)
MCH: 30.7 pg (ref 26.0–34.0)
MCHC: 33.6 g/dL (ref 30.0–36.0)
MCV: 91.2 fL (ref 80.0–100.0)
Monocytes Absolute: 1.1 10*3/uL — ABNORMAL HIGH (ref 0.1–1.0)
Monocytes Relative: 6 %
Neutro Abs: 14.8 10*3/uL — ABNORMAL HIGH (ref 1.7–7.7)
Neutrophils Relative %: 87 %
Platelets: 286 10*3/uL (ref 150–400)
RBC: 3.62 MIL/uL — ABNORMAL LOW (ref 3.87–5.11)
RDW: 12.6 % (ref 11.5–15.5)
WBC: 16.9 10*3/uL — ABNORMAL HIGH (ref 4.0–10.5)
nRBC: 0 % (ref 0.0–0.2)

## 2022-08-31 LAB — BASIC METABOLIC PANEL
Anion gap: 11 (ref 5–15)
BUN: 13 mg/dL (ref 6–20)
CO2: 23 mmol/L (ref 22–32)
Calcium: 7.5 mg/dL — ABNORMAL LOW (ref 8.9–10.3)
Chloride: 99 mmol/L (ref 98–111)
Creatinine, Ser: 0.56 mg/dL (ref 0.44–1.00)
GFR, Estimated: >60 mL/min (ref >60)
Glucose, Bld: 114 mg/dL — ABNORMAL HIGH (ref 70–99)
Potassium: 2.9 mmol/L — ABNORMAL LOW (ref 3.5–5.1)
Sodium: 133 mmol/L — ABNORMAL LOW (ref 135–145)

## 2022-08-31 LAB — MAGNESIUM: Magnesium: 1.9 mg/dL (ref 1.7–2.4)

## 2022-08-31 MED ORDER — SODIUM CHLORIDE 0.9 % IV SOLN
Freq: Once | INTRAVENOUS | Status: AC
  Filled 2022-08-31: qty 5

## 2022-08-31 MED ORDER — POTASSIUM CHLORIDE CRYS ER 20 MEQ PO TBCR
40.0000 meq | EXTENDED_RELEASE_TABLET | ORAL | Status: DC
  Administered 2022-08-31: 40 meq via ORAL
  Filled 2022-08-31: qty 2

## 2022-08-31 MED ORDER — LORAZEPAM 1 MG PO TABS
2.0000 mg | ORAL_TABLET | Freq: Three times a day (TID) | ORAL | Status: DC
  Administered 2022-08-31 – 2022-09-07 (×27): 2 mg via SUBLINGUAL
  Filled 2022-08-31 (×27): qty 2

## 2022-08-31 MED ORDER — OLANZAPINE 5 MG PO TBDP
15.0000 mg | ORAL_TABLET | Freq: Every day | ORAL | Status: DC
  Filled 2022-08-31: qty 3

## 2022-08-31 MED ORDER — DEXAMETHASONE 4 MG PO TABS
2.0000 mg | ORAL_TABLET | Freq: Every day | ORAL | Status: DC
  Administered 2022-08-31 – 2022-09-07 (×8): 2 mg via ORAL
  Filled 2022-08-31 (×8): qty 1

## 2022-08-31 MED ORDER — PANTOPRAZOLE SODIUM 40 MG PO TBEC: 40.0000 mg | DELAYED_RELEASE_TABLET | Freq: Two times a day (BID) | ORAL | Status: DC

## 2022-08-31 MED ORDER — SODIUM CHLORIDE 0.9 % IV SOLN: INTRAVENOUS | Status: DC

## 2022-08-31 MED ORDER — LORAZEPAM 1 MG PO TABS: 2.0000 mg | ORAL_TABLET | Freq: Three times a day (TID) | ORAL | Status: DC

## 2022-08-31 MED ORDER — LORAZEPAM 1 MG PO TABS
1.0000 mg | ORAL_TABLET | Freq: Two times a day (BID) | ORAL | Status: DC
  Administered 2022-08-31: 1 mg via ORAL
  Filled 2022-08-31: qty 1

## 2022-08-31 MED ORDER — POTASSIUM CHLORIDE 10 MEQ/100ML IV SOLN
10.0000 meq | INTRAVENOUS | Status: AC
  Administered 2022-08-31 (×4): 10 meq via INTRAVENOUS
  Filled 2022-08-31 (×4): qty 100

## 2022-08-31 MED ORDER — OLANZAPINE 5 MG PO TBDP
5.0000 mg | ORAL_TABLET | Freq: Every day | ORAL | Status: DC
  Administered 2022-08-31 – 2022-09-06 (×7): 5 mg via ORAL
  Filled 2022-08-31 (×7): qty 1

## 2022-08-31 MED ORDER — OLANZAPINE 5 MG PO TBDP
10.0000 mg | ORAL_TABLET | Freq: Every day | ORAL | Status: DC
  Administered 2022-08-31 – 2022-09-07 (×8): 10 mg via ORAL
  Filled 2022-08-31 (×9): qty 2

## 2022-08-31 MED ORDER — PANTOPRAZOLE SODIUM 40 MG IV SOLR
40.0000 mg | Freq: Two times a day (BID) | INTRAVENOUS | Status: DC
  Administered 2022-08-31 – 2022-09-07 (×15): 40 mg via INTRAVENOUS
  Filled 2022-08-31 (×16): qty 10

## 2022-08-31 NOTE — Telephone Encounter (Signed)
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 Utilized in room interpreter.   Counseled patient on administration, dosing, side effects, monitoring, drug-food interactions, safe handling, storage, and disposal.  Patient will take Tafinlar '75mg'$  capsules, 2 capsules ('150mg'$ ) by mouth twice daily.   Patient will take Mekinist '2mg'$  tablets,1 tablet by mouth once daily.   These will be taken on an empty stomach, 1 hour before or 2 hours after a meal.   Patient will take Tafinlar and Mekinist in the morning before breakfast, and will take Tafinlar again in the evening before bed, 2 hours after dinner.  Mekinist will be stored in the refrigerator.  Tafinlar start date: 08/30/22 PM Mekinist start date: 08/31/22   Adverse effects include but are not limited to: drug fever, rash, nausea, vomiting, diarrhea, peripheral edema, decreased LVEF.  Patient instructed that febrile reactions can occur with Tafinlar and Mekinist therapy. Patient instructed to interrupt therapy for any fever over 101.36F and to alert the office.  Patient has anti-emetic on hand and knows to take it if nausea develops.   Patient will obtain anti diarrheal and alert the office of 4 or more loose stools above baseline.  Reviewed with patient importance of keeping a medication schedule and plan for any missed doses. No barriers to medication adherence identified.  Medication reconciliation performed and medication/allergy list updated.  All questions answered.  Patient and patient's husband voiced understanding and appreciation.   Medication education handout given to patient. Patient knows to call the office with questions or concerns. [facility] phone number provided to patient.     Leron Croak, PharmD, BCPS, BCOP Hematology/Oncology Clinical Pharmacist [facility] [place] [phone] 08/31/2022 2:13 PM
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
## 2022-08-31 NOTE — Progress Notes (Addendum)
PROGRESS NOTE    Karen [name]  [id number] DOB: 1966-02-10 DOA: 08/21/2022 PCP: Starlyn Skeans, MD   Brief Narrative:  57 year old female with history of non-small cell lung cancer on chemotherapy, chronic back pain and hypertension who underwent first round of induction chemotherapy with carboplatin and Alimta on 08/18/22 and since then started having intractable nausea, vomiting chest pain and shortness of breath and unable to tolerate oral medications and diet and subsequently admitted.  She was found to have sided pleural effusion which was drained resulting in pneumothorax which was managed by pigtail catheter placement on 08/24/2022 by IR.  Pulmonary and palliative care were consulted.  Pigtail catheter removed on 08/28/2022.  Assessment & Plan:   Intractable nausea and vomiting secondary to chemotherapy Severe cancer related pain Major depression -Oncology evaluated the patient during this hospitalization. -Palliative care following and helping with symptom management -Continue IV fentanyl along with fentanyl patch and as needed Norco.  Continue bowel regimen. -Still intermittently vomiting.  Oral intake not that good.  Will resume IV fluids.  Right-sided pleural effusion status post thoracentesis with resultant right-sided hydropneumothorax -Pulmonary following.  Pigtail catheter placed on 08/24/2022 by IR and subsequently removed by pulmonary on 08/28/2022.  Pulmonary recommended outpatient follow-up with pulmonary to check on effusion and signed off on 08/30/2022  Metastatic non-small cell lung cancer on chemotherapy Goals of care -Oncology evaluated the patient during this hospitalization.  Outpatient follow-up with oncology. -Overall prognosis is guarded to poor.  Currently remains full code: Palliative care following.  If symptoms do not improve, might be a candidate for hospice.  Leukocytosis -WBCs worsening today to 16.9.  Possibly from Decadron use.   Monitor  Hyponatremia -Sodium 133 today.  Monitor.  Encourage oral intake.  IV fluids will be resumed today.  Hypokalemia -Replace.  Repeat a.m. labs  History of ischemic stroke in 07/2022 Hyperlipidemia -Continue Aspirin, plavix and statin  Possible anemia of chronic disease -From lung cancer.  Monitor intermittently  Physical deconditioning -PT recommends home health PT  DVT prophylaxis: Lovenox Code Status: Full Family Communication: None at bedside Disposition Plan: Status is: Inpatient Remains inpatient appropriate because: Of severity of illness  Consultants: IR/oncology/pulmonary/palliative care  Procedures: As above  Antimicrobials: None   Subjective: Patient seen and examined at bedside.  Still has intermittent nausea and vomited this morning as well.  Oral intake is still poor.  No fever, chest pain or worsening shortness of breath reported.  Objective: Vitals:   08/30/22 0451 08/30/22 1254 08/30/22 2154 08/31/22 0632  BP: 129/86 128/86 138/85 121/88  Pulse: (!) 106 (!) 104 (!) 101 (!) 106  Resp: '16 16 17 17  '$ Temp: 99.2 F (37.3 C) 97.8 F (36.6 C) 98.3 F (36.8 C) 97.8 F (36.6 C)  TempSrc: Oral Oral Oral Oral  SpO2: 90% 90% 92% 93%  Weight:      Height:        Intake/Output Summary (Last 24 hours) at 08/31/2022 1125 Last data filed at 08/31/2022 0900 Gross per 24 hour  Intake 466 ml  Output 650 ml  Net -184 ml    Filed Weights   08/21/22 1919 08/22/22 0146  Weight: 46.3 kg 45.8 kg    Examination:  General: No acute distress.  Still on room air.  Looks chronically ill and deconditioned ENT/neck: No obvious JVD elevation or palpable neck masses noted  respiratory: Bilateral decreased breath sounds at bases with scattered crackles  CVS: Still intermittently tachycardic; S1 and S2 heard  abdominal: Soft, nontender, mildly  distended; no organomegaly, normal bowel sound heard  extremities: No clubbing; mild lower extremity edema present   CNS: Alert and oriented.  Slow to respond and a poor historian.  No focal neurologic deficit.  Able to move extremities lymph: No palpable lymphadenopathy noted Skin: No obvious rashes/petechiae  psych: Not agitated currently.  Affect is still flat.   Musculoskeletal: No obvious joint swelling/deformity     Data Reviewed: I have personally reviewed following labs and imaging studies  CBC: Recent Labs  Lab 08/25/22 0548 08/30/22 0500 08/31/22 0558  WBC 12.6* 16.7* 16.9*  NEUTROABS 8.7* 12.8* 14.8*  HGB 12.2 11.1* 11.1*  HCT 36.1 32.2* 33.0*  MCV 90.9 91.2 91.2  PLT 297 281 [id number]    Basic Metabolic Panel: Recent Labs  Lab 08/25/22 0548 08/30/22 0500 08/31/22 0558  NA 131* 129* 133*  K 3.6 3.6 2.9*  CL 101 100 99  CO2 '23 22 23  '$ GLUCOSE 117* 96 114*  BUN '9 10 13  '$ CREATININE 0.57 0.66 0.56  CALCIUM 7.5* 7.5* 7.5*  MG 2.1 1.8 1.9  PHOS 1.9*  --   --     GFR: Estimated Creatinine Clearance: 56.8 mL/min (by C-G formula based on SCr of 0.56 mg/dL). Liver Function Tests: Recent Labs  Lab 08/25/22 0548  AST 19  ALT 33  ALKPHOS 96  BILITOT 0.7  PROT 6.7  ALBUMIN 3.4*    No results for input(s): "LIPASE", "AMYLASE" in the last 168 hours. No results for input(s): "AMMONIA" in the last 168 hours. Coagulation Profile: No results for input(s): "INR", "PROTIME" in the last 168 hours. Cardiac Enzymes: No results for input(s): "CKTOTAL", "CKMB", "CKMBINDEX", "TROPONINI" in the last 168 hours. BNP (last 3 results) No results for input(s): "PROBNP" in the last 8760 hours. HbA1C: No results for input(s): "HGBA1C" in the last 72 hours. CBG: No results for input(s): "GLUCAP" in the last 168 hours. Lipid Profile: No results for input(s): "CHOL", "HDL", "LDLCALC", "TRIG", "CHOLHDL", "LDLDIRECT" in the last 72 hours. Thyroid Function Tests: No results for input(s): "TSH", "T4TOTAL", "FREET4", "T3FREE", "THYROIDAB" in the last 72 hours. Anemia Panel: No results for  input(s): "VITAMINB12", "FOLATE", "FERRITIN", "TIBC", "IRON", "RETICCTPCT" in the last 72 hours. Sepsis Labs: No results for input(s): "PROCALCITON", "LATICACIDVEN" in the last 168 hours.  Recent Results (from the past 240 hour(s))  Culture, body fluid w Gram Stain-bottle     Status: None   Collection Time: 08/21/22  7:16 PM   Specimen: Fluid  Result Value Ref Range Status   Specimen Description FLUID  Final   Special Requests NONE  Final   Culture   Final    NO GROWTH 5 DAYS Performed at [facility], [address] [place], [place] [address]    Report Status 08/27/2022 FINAL  Final  Gram stain     Status: None   Collection Time: 08/21/22  7:16 PM   Specimen: Fluid  Result Value Ref Range Status   Specimen Description FLUID  Final   Special Requests NONE  Final   Gram Stain   Final    FEW WBC PRESENT, PREDOMINANTLY PMN NO ORGANISMS SEEN Performed at [facility], [place] [address] [place], [place] [address]    Report Status 08/22/2022 FINAL  Final  Resp panel by RT-PCR (RSV, Flu A&B, Covid) Anterior Nasal Swab     Status: None   Collection Time: 08/21/22  7:41 PM   Specimen: Anterior Nasal Swab  Result Value Ref Range Status  SARS Coronavirus 2 by RT PCR NEGATIVE NEGATIVE Final    Comment: (NOTE) SARS-CoV-2 target nucleic acids are NOT DETECTED.  The SARS-CoV-2 RNA is generally detectable in upper respiratory specimens during the acute phase of infection. The lowest concentration of SARS-CoV-2 viral copies this assay can detect is 138 copies/mL. A negative result does not preclude SARS-Cov-2 infection and should not be used as the sole basis for treatment or other patient management decisions. A negative result may occur with  improper specimen collection/handling, submission of specimen other than nasopharyngeal swab, presence of viral mutation(s) within the areas targeted by this assay, and inadequate number of viral copies(<138 copies/mL). A negative  result must be combined with clinical observations, patient history, and epidemiological information. The expected result is Negative.  Fact Sheet for Patients:  EntrepreneurPulse.com.au  Fact Sheet for Healthcare Providers:  IncredibleEmployment.be  This test is no t yet approved or cleared by the [place] FDA and  has been authorized for detection and/or diagnosis of SARS-CoV-2 by FDA under an Emergency Use Authorization (EUA). This EUA will remain  in effect (meaning this test can be used) for the duration of the COVID-19 declaration under Section 564(b)(1) of the Act, 21 U.S.C.section 360bbb-3(b)(1), unless the authorization is terminated  or revoked sooner.       Influenza A by PCR NEGATIVE NEGATIVE Final   Influenza B by PCR NEGATIVE NEGATIVE Final    Comment: (NOTE) The Xpert Xpress SARS-CoV-2/FLU/RSV plus assay is intended as an aid in the diagnosis of influenza from Nasopharyngeal swab specimens and should not be used as a sole basis for treatment. Nasal washings and aspirates are unacceptable for Xpert Xpress SARS-CoV-2/FLU/RSV testing.  Fact Sheet for Patients: EntrepreneurPulse.com.au  Fact Sheet for Healthcare Providers: IncredibleEmployment.be  This test is not yet approved or cleared by the [place] FDA and has been authorized for detection and/or diagnosis of SARS-CoV-2 by FDA under an Emergency Use Authorization (EUA). This EUA will remain in effect (meaning this test can be used) for the duration of the COVID-19 declaration under Section 564(b)(1) of the Act, 21 U.S.C. section 360bbb-3(b)(1), unless the authorization is terminated or revoked.     Resp Syncytial Virus by PCR NEGATIVE NEGATIVE Final    Comment: (NOTE) Fact Sheet for Patients: EntrepreneurPulse.com.au  Fact Sheet for Healthcare Providers: IncredibleEmployment.be  This  test is not yet approved or cleared by the [place] FDA and has been authorized for detection and/or diagnosis of SARS-CoV-2 by FDA under an Emergency Use Authorization (EUA). This EUA will remain in effect (meaning this test can be used) for the duration of the COVID-19 declaration under Section 564(b)(1) of the Act, 21 U.S.C. section 360bbb-3(b)(1), unless the authorization is terminated or revoked.  Performed at [facility], [place] [address], [place], [place] [address]   Culture, blood (routine x 2)     Status: None   Collection Time: 08/21/22  7:58 PM   Specimen: BLOOD  Result Value Ref Range Status   Specimen Description   Final    BLOOD PORTA CATH Performed at [facility] [address] [place], [place] [address]    Special Requests   Final    BOTTLES DRAWN AEROBIC AND ANAEROBIC Blood Culture adequate volume Performed at [facility] [address], [place], [place] [address]    Culture   Final    NO GROWTH 5 DAYS Performed at [facility], [place] [address], [place],  [address]  Report Status 08/26/2022 FINAL  Final  Culture, blood (routine x 2)     Status: None   Collection Time: 08/21/22  7:58 PM   Specimen: BLOOD LEFT ARM  Result Value Ref Range Status   Specimen Description   Final    BLOOD LEFT ARM Performed at [facility], [address] [place], [place] [address]    Special Requests   Final    BOTTLES DRAWN AEROBIC AND ANAEROBIC Blood Culture adequate volume Performed at [facility] [address] [place], [place] [address]    Culture   Final    NO GROWTH 5 DAYS Performed at [facility], [place] [address] [place], [place] [address]    Report Status 08/26/2022 FINAL  Final  Fungus Culture With Stain     Status: None (Preliminary result)   Collection Time: 08/22/22 10:02 AM   Specimen: PATH Cytology Pleural fluid  Result Value Ref Range Status    Fungus Stain Final report  Final    Comment: (NOTE) Performed At: [facility] [place], [place] [id number] Rush Farmer MD [id number]    Fungus (Mycology) Culture PENDING  Incomplete   Fungal Source PLEURAL  Final    Comment: Performed at [facility], [place] [address], [place],  [address]  Fungus Culture Result     Status: None   Collection Time: 08/22/22 10:02 AM  Result Value Ref Range Status   Result 1 Comment  Final    Comment: (NOTE) KOH/Calcofluor preparation:  no fungus observed. Performed At: [facility] [place], [place] [id number] Rush Farmer MD [id number]          Radiology Studies: No results found.      Scheduled Meds:  aspirin EC  81 mg Oral Daily   Chlorhexidine Gluconate Cloth  6 each Topical Daily   clopidogrel  75 mg Oral Daily   dabrafenib mesylate  150 mg Oral Q12H   diclofenac Sodium  4 g Topical QID   dronabinol  5 mg Oral BID AC   enoxaparin (LOVENOX) injection  40 mg Subcutaneous Q24H   feeding supplement  1 Container Oral TID BM   fentaNYL  1 patch Transdermal [id number]   folic acid  1 mg Oral Daily   LORazepam  2 mg Sublingual TID AC & HS   OLANZapine zydis  10 mg Oral Daily   OLANZapine zydis  5 mg Oral QHS   pantoprazole (PROTONIX) IV  40 mg Intravenous Q12H   senna  1 tablet Oral QHS   sertraline  100 mg Oral BID   trametinib dimethyl sulfoxide  2 mg Oral Daily   Continuous Infusions:  fosaprepitant (EMEND) 150 mg, dexamethasone (DECADRON) 12 mg in sodium chloride 0.9 % 145 mL IVPB     [START ON 09/01/2022] fosaprepitant (EMEND) 150 mg, dexamethasone (DECADRON) 12 mg in sodium chloride 0.9 % 145 mL IVPB     [START ON 09/02/2022] fosaprepitant (EMEND) 150 mg, dexamethasone (DECADRON) 12 mg in sodium chloride 0.9 % 145 mL IVPB     ondansetron 8 mg (08/31/22 0954)   potassium chloride            Aline August, MD [facility] 08/31/2022, 11:25 AM

## 2022-08-31 NOTE — Progress Notes (Signed)
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 350 MG/ML SOLN COMPARISON:  Chest CT dated 06/25/2022 and radiograph dated 08/21/2022. FINDINGS: Evaluation of this exam is limited due to respiratory motion artifact. Cardiovascular: There is no cardiomegaly small pericardial effusion, new since the prior CT and measuring 1 cm in thickness. The thoracic aorta is unremarkable. The origins of the great vessels of the aortic arch appear patent. Evaluation of the pulmonary arteries is limited due to respiratory motion. No central pulmonary artery embolus identified. Mediastinum/Nodes: There is infiltrative soft tissue the mediastinum and bilateral hila in keeping with known malignancy. No mediastinal fluid collection. Right-sided Port-A-Cath with tip at the cavoatrial junction. Lungs/Pleura: There is a moderate size right and trace left pleural effusions, new since the prior CT and suspicious for malignant effusion. There is partial compressive atelectasis of the right lower lobe. Patchy area of consolidation in the right perihilar region extending into the right upper  lobe and right middle lobes may represent combination of tumor infiltration, atelectasis, or pneumonia. There is however diffuse interstitial thickening and nodularity primarily involving the upper lobes bilaterally as well as in the lower lobes suspicious for lymphangitic carcinomatosis. There is no pneumothorax. There is narrowing of the right upper lobe bronchus as well as narrowing of the right middle and right lower lobe bronchi. The central airways remain  patent. Upper Abdomen: Small calcified splenic granuloma. Musculoskeletal: Sclerotic lesions involving T10, T12, and L3 consistent with metastatic disease. No acute osseous pathology. Review of the MIP images confirms the above findings. IMPRESSION: 1. No CT evidence of central pulmonary artery embolus. 2. Moderate size right and trace left pleural effusions, new since the prior CT and suspicious for malignant effusion. 3. Infiltrative mediastinal and hilar mass in keeping with known malignancy. 4. Significant progression of disease with bilateral perihilar consolidation and findings 5. suspicious for lymphangitic carcinomatosis. 6. Sclerotic lesions involving T10, T12, and L3 consistent with metastatic disease. Electronically Signed   By: Anner Crete M.D.   On: 08/21/2022 20:53   DG Chest Port 1 View  Result Date: 08/21/2022 CLINICAL DATA:  Shortness of breath EXAM: PORTABLE CHEST 1 VIEW COMPARISON:  PET-CT dated July 29, 2022 and CT examination dated August 13, 2022 FINDINGS: The heart is normal in size. Pulmonary vascular congestion with bilateral perihilar opacities consistent with patient's known hilar lymphadenopathy. Small bilateral pleural effusions. Right IJ access MediPort with distal tip in the SVC no acute osseous abnormality IMPRESSION: 1. Pulmonary vascular congestion with bilateral perihilar opacities consistent with patient's known hilar lymphadenopathy. 2. Small bilateral pleural effusions. Electronically Signed   By: Keane Police D.O.   On: 08/21/2022 19:59   VAS [place] LOWER EXTREMITY VENOUS (DVT)  Result Date: 08/17/2022  Lower Venous DVT Study Patient Name:  TERRICA DACE  Date of Exam:   08/17/2022 Medical Rec #: [id number]               Accession #:    [id number] Date of Birth: 1966/06/11                Patient Gender: F Patient Age:   57 years Exam Location:  [facility] Procedure:      VAS [place] LOWER EXTREMITY VENOUS (DVT) Referring Phys: Lottie Mussel --------------------------------------------------------------------------------  Other Indications: Stroke, primary lung adenocarcinoma. Comparison Study: No prior study. Performing Technologist: McKayla Maag RVT, [place]  Examination Guidelines: A complete evaluation includes B-mode imaging, spectral Doppler, color Doppler, and power Doppler as needed of all accessible portions of each vessel. Bilateral testing is considered an integral part of a complete examination. Limited examinations for reoccurring indications may be performed as noted. The reflux portion of the exam is performed with the patient in reverse Trendelenburg.  +---------+---------------+---------+-----------+----------+--------------+ RIGHT    CompressibilityPhasicitySpontaneityPropertiesThrombus Aging +---------+---------------+---------+-----------+----------+--------------+ CFV      Full           Yes      Yes                                 +---------+---------------+---------+-----------+----------+--------------+ SFJ      Full                                                        +---------+---------------+---------+-----------+----------+--------------+ FV Prox  Full                                                        +---------+---------------+---------+-----------+----------+--------------+ FV Mid   Full                                                        +---------+---------------+---------+-----------+----------+--------------+  FV DistalFull                                                         +---------+---------------+---------+-----------+----------+--------------+ PFV      Full                                                        +---------+---------------+---------+-----------+----------+--------------+ POP      Full           Yes      Yes                                 +---------+---------------+---------+-----------+----------+--------------+ PTV      Full                                                        +---------+---------------+---------+-----------+----------+--------------+ PERO     Full                                                        +---------+---------------+---------+-----------+----------+--------------+   +---------+---------------+---------+-----------+----------+--------------+ LEFT     CompressibilityPhasicitySpontaneityPropertiesThrombus Aging +---------+---------------+---------+-----------+----------+--------------+ CFV      Full           Yes      Yes                                 +---------+---------------+---------+-----------+----------+--------------+ SFJ      Full                                                        +---------+---------------+---------+-----------+----------+--------------+ FV Prox  Full                                                        +---------+---------------+---------+-----------+----------+--------------+ FV Mid   Full                                                        +---------+---------------+---------+-----------+----------+--------------+ FV DistalFull                                                        +---------+---------------+---------+-----------+----------+--------------+  PFV      Full                                                        +---------+---------------+---------+-----------+----------+--------------+ POP      Full           Yes      Yes                                  +---------+---------------+---------+-----------+----------+--------------+ PTV      Full                                                        +---------+---------------+---------+-----------+----------+--------------+ PERO     Full                                                        +---------+---------------+---------+-----------+----------+--------------+     Summary: BILATERAL: - No evidence of deep vein thrombosis seen in the lower extremities, bilaterally. - No evidence of superficial venous thrombosis in the lower extremities, bilaterally. -No evidence of popliteal cyst, bilaterally.   *See table(s) above for measurements and observations. Electronically signed by Deitra Mayo MD on 08/17/2022 at 7:34:49 PM.    Final    IR IMAGING GUIDED PORT INSERTION  Result Date: 08/17/2022 INDICATION: Chemotherapy access EXAM: Chest port placement using ultrasound and fluoroscopic guidance MEDICATIONS: Documented in the EMR ANESTHESIA/SEDATION: Moderate (conscious) sedation was employed during this procedure. A total of Versed 2 mg and Fentanyl 100 mcg was administered intravenously. Moderate Sedation Time: 32 minutes. The patient's level of consciousness and vital signs were monitored continuously by radiology nursing throughout the procedure under my direct supervision. FLUOROSCOPY TIME:  Fluoroscopy Time: 0.1 minutes (1 mGy) COMPLICATIONS: None immediate. PROCEDURE: Informed written consent was obtained from the patient after a thorough discussion of the procedural risks, benefits and alternatives. All questions were addressed. Maximal Sterile Barrier Technique was utilized including caps, mask, sterile gowns, sterile gloves, sterile drape, hand hygiene and skin antiseptic. A timeout was performed prior to the initiation of the procedure. The patient was placed supine on the exam table. The right neck and chest was prepped and draped in the standard sterile fashion. A preliminary  ultrasound of the right neck was performed and demonstrates a patent right internal jugular vein. A permanent ultrasound image was stored in the electronic medical record. The overlying skin was anesthetized with 1% Lidocaine. Using ultrasound guidance, access was obtained into the right internal jugular vein using a 21 gauge micropuncture set. A wire was advanced into the SVC, a short incision was made at the puncture site, and serial dilatation performed. Next, in an ipsilateral infraclavicular location, an incision was made at the site of the subcutaneous reservoir. Blunt dissection was used to open a pocket to contain the reservoir. A subcutaneous tunnel was then created from the port site to the puncture site. A(n) 8 Fr single lumen catheter was advanced  through the tunnel. The catheter was attached to the port and this was placed in the subcutaneous pocket. Under fluoroscopic guidance, a peel away sheath was placed, and the catheter was trimmed to the appropriate length and was advanced into the central veins. The catheter length is 23 cm. The tip of the catheter lies near the superior cavoatrial junction. The port flushes and aspirates appropriately. The port was flushed and locked with heparinized saline. The port pocket was closed in 2 layers using 3-0 and 4-0 Vicryl/absorbable suture. Dermabond was also applied to both incisions. The patient tolerated the procedure well and was transferred to recovery in stable condition. IMPRESSION: Successful placement of a right-sided chest port via the right internal jugular vein. The port is ready for immediate use. Electronically Signed   By: Albin Felling M.D.   On: 08/17/2022 16:49   ECHO TEE  Result Date: 08/17/2022    TRANSESOPHOGEAL ECHO REPORT   Patient Name:   DUDLEY CAIRE Date of Exam: 08/17/2022 Medical Rec #:  [id number]              Height:       62.0 in Accession #:    [id number]             Weight:       98.1 lb Date of Birth:  1965/12/11                BSA:          1.412 m Patient Age:    26 years               BP:           119/82 mmHg Patient Gender: F                      HR:           88 bpm. Exam Location:  Inpatient Procedure: Transesophageal Echo, Color Doppler, Cardiac Doppler and Saline            Contrast Bubble Study Indications:     Stroke  History:         Patient has prior history of Echocardiogram examinations, most                  recent 08/14/2022. Risk Factors:Hypertension. Latent TB,                  metastatic lung adenocarcinoma.  Sonographer:     Eartha Inch Referring Phys:  [id number] Margie Billet Diagnosing Phys: Oswaldo Milian MD PROCEDURE: After discussion of the risks and benefits of a TEE, an informed consent was obtained from the patient. The transesophogeal probe was passed without difficulty through the esophogus of the patient. Imaged were obtained with the patient in a left lateral decubitus position. Sedation performed by different physician. The patient was monitored while under deep sedation. Image quality was good. The patient's vital signs; including heart rate, blood pressure, and oxygen saturation; remained stable throughout the procedure. The patient developed no complications during the procedure.  IMPRESSIONS  1. Left ventricular ejection fraction, by estimation, is 60 to 65%. The left ventricle has normal function.  2. Right ventricular systolic function is normal. The right ventricular size is normal.  3. No left atrial/left atrial appendage thrombus was detected.  4. Moderate pericardial effusion. Moderate pleural effusion.  5. The mitral valve is normal in structure. Trivial mitral valve regurgitation.  6. Tricuspid valve regurgitation is mild  to moderate.  7. The aortic valve is tricuspid. Aortic valve regurgitation is not visualized. No aortic stenosis is present.  8. Evidence of atrial level shunting detected by color flow Doppler. Agitated saline contrast bubble study was positive with shunting  observed after >6 cardiac cycles suggestive of intrapulmonary shunting. Conclusion(s)/Recommendation(s): No intracardiac source of embolism detected on this on this transesophageal echocardiogram. FINDINGS  Left Ventricle: Left ventricular ejection fraction, by estimation, is 60 to 65%. The left ventricle has normal function. The left ventricular internal cavity size was normal in size. Right Ventricle: The right ventricular size is normal. No increase in right ventricular wall thickness. Right ventricular systolic function is normal. Left Atrium: Left atrial size was normal in size. No left atrial/left atrial appendage thrombus was detected. Right Atrium: Right atrial size was normal in size. Pericardium: A moderately sized pericardial effusion is present. Mitral Valve: The mitral valve is normal in structure. Trivial mitral valve regurgitation. Tricuspid Valve: The tricuspid valve is normal in structure. Tricuspid valve regurgitation is mild to moderate. Aortic Valve: The aortic valve is tricuspid. Aortic valve regurgitation is not visualized. No aortic stenosis is present. Pulmonic Valve: The pulmonic valve was not well visualized. Pulmonic valve regurgitation is trivial. Aorta: The aortic root and ascending aorta are structurally normal, with no evidence of dilitation. IAS/Shunts: Evidence of atrial level shunting detected by color flow Doppler. Agitated saline contrast was given intravenously to evaluate for intracardiac shunting. Agitated saline contrast bubble study was positive with shunting observed after >6 cardiac cycles suggestive of intrapulmonary shunting. Additional Comments: There is a moderate pleural effusion. Spectral Doppler performed. TRICUSPID VALVE TR Peak grad:   31.6 mmHg TR Mean grad:   21.0 mmHg TR Vmax:        281.00 cm/s TR Vmean:       218.0 cm/s Oswaldo Milian MD Electronically signed by Oswaldo Milian MD Signature Date/Time: 08/17/2022/1:59:41 PM    Final (Updated)     ECHOCARDIOGRAM COMPLETE  Result Date: 08/14/2022    ECHOCARDIOGRAM REPORT   Patient Name:   AMBYR CATCHINGS Date of Exam: 08/14/2022 Medical Rec #:  [id number]              Height:       62.0 in Accession #:    [id number]             Weight:       98.0 lb Date of Birth:  November 21, 1965               BSA:          1.412 m Patient Age:    71 years               BP:           134/68 mmHg Patient Gender: F                      HR:           74 bpm. Exam Location:  Inpatient Procedure: 2D Echo, Cardiac Doppler and Color Doppler Indications:    I63.9 Stroke  History:        Patient has prior history of Echocardiogram examinations, most                 recent 05/29/2021. Stroke; Risk Factors:Non-Smoker and                 Hypertension.  Sonographer:    Wilkie Aye RVT RCS Referring Phys:  [place] Comments: Some images are off axis IMPRESSIONS  1. Left ventricular ejection fraction, by estimation, is 65 to 70%. The left ventricle has normal function. The left ventricle has no regional wall motion abnormalities. Left ventricular diastolic parameters are consistent with Grade I diastolic dysfunction (impaired relaxation).  2. Right ventricular systolic function is normal. The right ventricular size is normal.  3. Moderate pericardial effusion. The pericardial effusion is circumferential. There is no evidence of cardiac tamponade.  4. The mitral valve is normal in structure. Trivial mitral valve regurgitation.  5. The aortic valve is tricuspid. Aortic valve regurgitation is not visualized. No aortic stenosis is present.  6. The inferior vena cava is normal in size with greater than 50% respiratory variability, suggesting right atrial pressure of 3 mmHg. Comparison(s): Compared to prior TTE on 05/2021, a moderate sized pericardial effusion is now present. Conclusion(s)/Recommendation(s): No intracardiac source of embolism detected on this transthoracic study. Consider a transesophageal  echocardiogram to exclude cardiac source of embolism if clinically indicated. FINDINGS  Left Ventricle: Left ventricular ejection fraction, by estimation, is 65 to 70%. The left ventricle has normal function. The left ventricle has no regional wall motion abnormalities. The left ventricular internal cavity size was normal in size. There is  no left ventricular hypertrophy. Left ventricular diastolic parameters are consistent with Grade I diastolic dysfunction (impaired relaxation). Right Ventricle: The right ventricular size is normal. No increase in right ventricular wall thickness. Right ventricular systolic function is normal. Left Atrium: Left atrial size was normal in size. Right Atrium: Right atrial size was normal in size. Pericardium: A moderately sized pericardial effusion is present. The pericardial effusion is circumferential. There is no evidence of cardiac tamponade. Mitral Valve: The mitral valve is normal in structure. Trivial mitral valve regurgitation. Tricuspid Valve: The tricuspid valve is normal in structure. Tricuspid valve regurgitation is mild. Aortic Valve: The aortic valve is tricuspid. Aortic valve regurgitation is not visualized. No aortic stenosis is present. Aortic valve mean gradient measures 3.0 mmHg. Aortic valve peak gradient measures 6.2 mmHg. Aortic valve area, by VTI measures 2.21 cm. Pulmonic Valve: The pulmonic valve was normal in structure. Pulmonic valve regurgitation is trivial. Aorta: The aortic root is normal in size and structure. Venous: The inferior vena cava is normal in size with greater than 50% respiratory variability, suggesting right atrial pressure of 3 mmHg. IAS/Shunts: The atrial septum is grossly normal.  LEFT VENTRICLE PLAX 2D LVIDd:         3.90 cm   Diastology LVIDs:         2.40 cm   LV e' medial:    5.33 cm/s LV PW:         1.00 cm   LV E/e' medial:  11.0 LV IVS:        0.90 cm   LV e' lateral:   9.46 cm/s LVOT diam:     1.80 cm   LV E/e' lateral: 6.2 LV  SV:         53 LV SV Index:   38 LVOT Area:     2.54 cm  RIGHT VENTRICLE             IVC RV S prime:     15.30 cm/s  IVC diam: 1.10 cm TAPSE (M-mode): 2.1 cm LEFT ATRIUM             Index        RIGHT ATRIUM  Index LA diam:        2.40 cm 1.70 cm/m   RA Area:     9.25 cm LA Vol (A2C):   21.7 ml 15.37 ml/m  RA Volume:   18.95 ml 13.42 ml/m LA Vol (A4C):   31.5 ml 22.31 ml/m LA Biplane Vol: 28.3 ml 20.05 ml/m  AORTIC VALVE                    PULMONIC VALVE AV Area (Vmax):    2.09 cm     PV Vmax:          0.77 m/s AV Area (Vmean):   2.02 cm     PV Peak grad:     2.4 mmHg AV Area (VTI):     2.21 cm     PR End Diast Vel: 6.86 msec AV Vmax:           124.00 cm/s AV Vmean:          83.300 cm/s AV VTI:            0.241 m AV Peak Grad:      6.2 mmHg AV Mean Grad:      3.0 mmHg LVOT Vmax:         102.00 cm/s LVOT Vmean:        66.000 cm/s LVOT VTI:          0.209 m LVOT/AV VTI ratio: 0.87  AORTA Ao Root diam: 2.60 cm Ao Asc diam:  2.60 cm MITRAL VALVE               TRICUSPID VALVE MV Area (PHT): 2.99 cm    TR Peak grad:   17.5 mmHg MV Decel Time: 254 msec    TR Vmax:        209.00 cm/s MV E velocity: 58.60 cm/s MV A velocity: 89.70 cm/s  SHUNTS MV E/A ratio:  0.65        Systemic VTI:  0.21 m                            Systemic Diam: 1.80 cm Gwyndolyn Kaufman MD Electronically signed by Gwyndolyn Kaufman MD Signature Date/Time: 08/14/2022/2:29:20 PM    Final    CT ANGIO HEAD NECK W WO CM  Result Date: 08/13/2022 CLINICAL DATA:  Stroke follow-up EXAM: CT ANGIOGRAPHY HEAD AND NECK TECHNIQUE: Multidetector CT imaging of the head and neck was performed using the standard protocol during bolus administration of intravenous contrast. Multiplanar CT image reconstructions and MIPs were obtained to evaluate the vascular anatomy. Carotid stenosis measurements (when applicable) are obtained utilizing NASCET criteria, using the distal internal carotid diameter as the denominator. RADIATION DOSE REDUCTION: This exam  was performed according to the departmental dose-optimization program which includes automated exposure control, adjustment of the mA and/or kV according to patient size and/or use of iterative reconstruction technique. CONTRAST:  38m OMNIPAQUE IOHEXOL 350 MG/ML SOLN COMPARISON:  07/01/2022 FINDINGS: CT HEAD FINDINGS Brain: There is no mass, hemorrhage or extra-axial collection. The size and configuration of the ventricles and extra-axial CSF spaces are normal. There is no acute or chronic infarction. The brain parenchyma is normal. Skull: The visualized skull base, calvarium and extracranial soft tissues are normal. Sinuses/Orbits: No fluid levels or advanced mucosal thickening of the visualized paranasal sinuses. No mastoid or middle ear effusion. The orbits are normal. CTA NECK FINDINGS SKELETON: There is no bony spinal canal stenosis. No lytic  or blastic lesion. OTHER NECK: Normal pharynx, larynx and major salivary glands. No cervical lymphadenopathy. Unremarkable thyroid gland. UPPER CHEST: Masslike opacity at the right hilum measuring 3.4 x 2.1 cm. There is peribronchial thickening in the right lung apex. Small right pleural effusion. AORTIC ARCH: There is no calcific atherosclerosis of the aortic arch. There is no aneurysm, dissection or hemodynamically significant stenosis of the visualized portion of the aorta. Conventional 3 vessel aortic branching pattern. The visualized proximal subclavian arteries are widely patent. RIGHT CAROTID SYSTEM: Normal without aneurysm, dissection or stenosis. LEFT CAROTID SYSTEM: Normal without aneurysm, dissection or stenosis. VERTEBRAL ARTERIES: Left dominant configuration. Both origins are clearly patent. There is no dissection, occlusion or flow-limiting stenosis to the skull base (V1-V3 segments). CTA HEAD FINDINGS POSTERIOR CIRCULATION: --Vertebral arteries: Normal V4 segments. --Inferior cerebellar arteries: Normal. --Basilar artery: Normal. --Superior cerebellar  arteries: Normal. --Posterior cerebral arteries (PCA): Normal. ANTERIOR CIRCULATION: --Intracranial internal carotid arteries: Normal. --Anterior cerebral arteries (ACA): Normal. Both A1 segments are present. Patent anterior communicating artery (a-comm). --Middle cerebral arteries (MCA): Normal. VENOUS SINUSES: As permitted by contrast timing, patent. ANATOMIC VARIANTS: None Review of the MIP images confirms the above findings. IMPRESSION: 1. No emergent large vessel occlusion or high-grade stenosis of the intracranial or cervical arteries. 2. Masslike opacity at the right hilum measuring 3.4 x 2.1 cm, likely confluent hilar malignant adenopathy. 3. Small right pleural effusion. Electronically Signed   By: Ulyses Jarred M.D.   On: 08/13/2022 23:51   MR Brain Wo Contrast (neuro protocol)  Result Date: 08/13/2022 CLINICAL DATA:  TIA EXAM: MRI HEAD WITHOUT CONTRAST TECHNIQUE: Multiplanar, multiecho pulse sequences of the brain and surrounding structures were obtained without intravenous contrast. COMPARISON:  MRI Brain 07/31/22 FINDINGS: Brain: Small punctate focus of acute infarct in the left parietal lobe (series 5, image 33). No hemorrhage. No hydrocephalus. No extra-axial fluid collection. There are multiple small scattered subcortical T2/FLAIR hyperintense lesions, which are favored to represent sequela of chronic microvascular ischemic change in appear unchanged compared to recent prior brain MRI. Vascular: Normal flow voids. Skull and upper cervical spine: Normal marrow signal. Sinuses/Orbits: Negative. Other: None. IMPRESSION: Small focus of diffusion restriction in the left parietal lobe is favored to represent a punctate acute infarct, new compared to 07/31/22. Given patient's history of malignancy a follow up MRI in 2-3 months to ensure resolution.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

## 2022-09-01 DIAGNOSIS — Z515 Encounter for palliative care: Secondary | ICD-10-CM | POA: Diagnosis not present

## 2022-09-01 DIAGNOSIS — R630 Anorexia: Secondary | ICD-10-CM

## 2022-09-01 DIAGNOSIS — R112 Nausea with vomiting, unspecified: Secondary | ICD-10-CM | POA: Diagnosis not present

## 2022-09-01 DIAGNOSIS — J910 Malignant pleural effusion: Secondary | ICD-10-CM | POA: Diagnosis not present

## 2022-09-01 DIAGNOSIS — Z8673 Personal history of transient ischemic attack (TIA), and cerebral infarction without residual deficits: Secondary | ICD-10-CM

## 2022-09-01 DIAGNOSIS — C3490 Malignant neoplasm of unspecified part of unspecified bronchus or lung: Secondary | ICD-10-CM | POA: Diagnosis not present

## 2022-09-01 DIAGNOSIS — J90 Pleural effusion, not elsewhere classified: Secondary | ICD-10-CM | POA: Diagnosis not present

## 2022-09-01 LAB — CBC WITH DIFFERENTIAL/PLATELET
Abs Immature Granulocytes: 0.02 10*3/uL (ref 0.00–0.07)
Basophils Absolute: 0 10*3/uL (ref 0.0–0.1)
Basophils Relative: 0 %
Eosinophils Absolute: 0 10*3/uL (ref 0.0–0.5)
Eosinophils Relative: 0 %
HCT: 30.5 % — ABNORMAL LOW (ref 36.0–46.0)
Hemoglobin: 10.3 g/dL — ABNORMAL LOW (ref 12.0–15.0)
Immature Granulocytes: 0 %
Lymphocytes Relative: 23 %
Lymphs Abs: 1.2 10*3/uL (ref 0.7–4.0)
MCH: 30.9 pg (ref 26.0–34.0)
MCHC: 33.8 g/dL (ref 30.0–36.0)
MCV: 91.6 fL (ref 80.0–100.0)
Monocytes Absolute: 0.3 10*3/uL (ref 0.1–1.0)
Monocytes Relative: 6 %
Neutro Abs: 3.8 10*3/uL (ref 1.7–7.7)
Neutrophils Relative %: 71 %
Platelets: 321 10*3/uL (ref 150–400)
RBC: 3.33 MIL/uL — ABNORMAL LOW (ref 3.87–5.11)
RDW: 12.9 % (ref 11.5–15.5)
WBC: 5.3 10*3/uL (ref 4.0–10.5)
nRBC: 0 % (ref 0.0–0.2)

## 2022-09-01 LAB — COMPREHENSIVE METABOLIC PANEL
ALT: 35 U/L (ref 0–44)
AST: 15 U/L (ref 15–41)
Albumin: 2.8 g/dL — ABNORMAL LOW (ref 3.5–5.0)
Alkaline Phosphatase: 97 U/L (ref 38–126)
Anion gap: 9 (ref 5–15)
BUN: 15 mg/dL (ref 6–20)
CO2: 22 mmol/L (ref 22–32)
Calcium: 7.3 mg/dL — ABNORMAL LOW (ref 8.9–10.3)
Chloride: 103 mmol/L (ref 98–111)
Creatinine, Ser: 0.47 mg/dL (ref 0.44–1.00)
GFR, Estimated: >60 mL/min (ref >60)
Glucose, Bld: 113 mg/dL — ABNORMAL HIGH (ref 70–99)
Potassium: 3.6 mmol/L (ref 3.5–5.1)
Sodium: 134 mmol/L — ABNORMAL LOW (ref 135–145)
Total Bilirubin: 0.5 mg/dL (ref 0.3–1.2)
Total Protein: 5.6 g/dL — ABNORMAL LOW (ref 6.5–8.1)

## 2022-09-01 LAB — MAGNESIUM: Magnesium: 2 mg/dL (ref 1.7–2.4)

## 2022-09-01 MED ORDER — DIPHENHYDRAMINE HCL 25 MG PO CAPS
50.0000 mg | ORAL_CAPSULE | Freq: Every evening | ORAL | Status: DC | PRN
  Administered 2022-09-01: 50 mg via ORAL
  Filled 2022-09-01: qty 2

## 2022-09-01 MED ORDER — POTASSIUM CHLORIDE CRYS ER 20 MEQ PO TBCR
40.0000 meq | EXTENDED_RELEASE_TABLET | Freq: Two times a day (BID) | ORAL | Status: AC
  Administered 2022-09-01 – 2022-09-02 (×2): 40 meq via ORAL
  Filled 2022-09-01 (×2): qty 2

## 2022-09-01 MED ORDER — SODIUM CHLORIDE 0.9 % IV SOLN: INTRAVENOUS | Status: DC

## 2022-09-01 MED ORDER — CALCIUM GLUCONATE-NACL 2-0.675 GM/100ML-% IV SOLN
2.0000 g | Freq: Once | INTRAVENOUS | Status: AC
  Administered 2022-09-01: 2000 mg via INTRAVENOUS
  Filled 2022-09-01: qty 100
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
## 2022-09-01 NOTE — Progress Notes (Signed)
PROGRESS NOTE    Karen [name]  [id number] DOB: [birth year] DOA: 08/21/2022 PCP: Starlyn Skeans, MD     Brief Narrative:  57 year old HF PMHx primary language Spanish (understands significant amount of English) non-small cell lung cancer on chemotherapy, chronic back pain and essential HTN, who underwent first round of induction chemotherapy with carboplatin and Alimta on 08/18/22 and since then started having intractable nausea, vomiting chest pain and shortness of breath and unable to tolerate oral medications and diet   Admitted. She was found to have sided pleural effusion which was drained resulting in pneumothorax which was managed by pigtail catheter placement on 08/24/2022 by IR. Pulmonary and palliative care were consulted. Pigtail catheter removed on 08/28/2022.    Subjective: A/O x 4, patient unsure how many doses of chemotherapy she has received to date.  Positive nausea, negative vomiting, positive chest pain (related to pneumothorax) described as mild.  Positive mild SOB.   Assessment & Plan: Covid vaccination;   Principal Problem:   Pleural effusion on right Active Problems:   Intractable nausea and vomiting   Primary adenocarcinoma of lung (HCC)   Cancer associated pain   Major depression, recurrent, chronic (HCC)   History of ischemic stroke   Goals of care, counseling/discussion   Malignant pleural effusion  Metastatic non-small cell lung cancer -On chemotherapy -Seen by Dr. Sullivan Lone Oncology evaluated the patient during this hospitalization.  Outpatient follow-up with oncology. -Overall prognosis is guarded to poor.   -3/13 palliative care evaluated continue current treatment.  Full code.  Patient would be a good hospice candidate however per palliative care TBD.    Right-sided pleural effusion  -S/p Thoracentesis with resultant right-sided hydropneumothorax -3/5 s/p.  Pigtail catheter placed by IR  -3/9 PCCM removed pigtail catheter  -Per PCCM  follow-up as outpatient upon discharge.  Signed off 3/11  Intractable nausea and vomiting -Most likely secondary to chemotherapy - Compazine 10 mg IV PRN.  Will discuss with patient will have to find some PO regimen to control her N/V - Lorazepam 2 mg TID -Emend  150 mg+ dexamethasone 12 mg x 2 more days - Zofran IV 8 mg BID  Severe cancer pain  -Fentanyl patch 12.5 mcg - 3/13 pain controlled today per patient  Major depression -Zyprexa 10 mg BID  Leukocytosis - Resolved  Hyponatremia - Poor oral intake, most likely component of dehydration - Normal saline 43m/hr  Hypokalemia - Potassium goal >4 -3/13 K-Dur 40 mEq x 2 doses  Hypocalcemia - Calcium goal> 8.9 - 3/13 corrected calcium= 8.2 - 3/13 Calcium Gluconate 2 g   Hx ischemic stroke in 07/2022 --ASA 81 mg daily - Plavix 75 mg daily   HLD - 3/13 lipid panel pending -Patient currently not on statin will start Lipitor in a.m.   Anemia of chronic disease? -Transfuse for hemoglobin<7 -3/13 anemia panel pending    Physical deconditioning -PT recommends home health PT    Mobility Assessment (last 72 hours)     Mobility Assessment     Row Name 08/30/22 2200 08/30/22 1213         Does patient have an order for bedrest or is patient medically unstable No - Continue assessment --      What is the highest level of mobility based on the progressive mobility assessment? Level 5 (Walks with assist in room/hall) - Balance while stepping forward/back and can walk in room with assist - Complete Level 5 (Walks with assist in room/hall) - Balance while stepping forward/back and  can walk in room with assist - Complete                     DVT prophylaxis: Lovenox Code Status: Full Family Communication:  Status is: Inpatient    Dispo: The patient is from: Home              Anticipated d/c is to:  ???              Anticipated d/c date is: > 3 days              Patient currently is not medically stable to  d/c.      Consultants:    Procedures/Significant Events:    I have personally reviewed and interpreted all radiology studies and my findings are as above.  VENTILATOR SETTINGS:    Cultures   Antimicrobials: Anti-infectives (From admission, onward)    None         Devices    LINES / TUBES:      Continuous Infusions:  sodium chloride 75 mL/hr at 09/01/22 0553   fosaprepitant (EMEND) 150 mg, dexamethasone (DECADRON) 12 mg in sodium chloride 0.9 % 145 mL IVPB     [START ON 09/02/2022] fosaprepitant (EMEND) 150 mg, dexamethasone (DECADRON) 12 mg in sodium chloride 0.9 % 145 mL IVPB     ondansetron Stopped (08/31/22 2145)     Objective: Vitals:   08/31/22 0632 08/31/22 1418 08/31/22 2119 09/01/22 0523  BP: 121/88 130/84 120/78 132/88  Pulse: (!) 106 (!) 103 87 95  Resp: '17 15 16 17  '$ Temp: 97.8 F (36.6 C) 98.4 F (36.9 C) (!) 97.5 F (36.4 C) 98.1 F (36.7 C)  TempSrc: Oral Oral Oral Oral  SpO2: 93% (!) 89% 90% 97%  Weight:      Height:        Intake/Output Summary (Last 24 hours) at 09/01/2022 [id number] Last data filed at 09/01/2022 0553 Gross per 24 hour  Intake 1713.7 ml  Output --  Net 1713.7 ml   Filed Weights   08/21/22 1919 08/22/22 0146  Weight: 46.3 kg 45.8 kg    Examination:  General: A/O x 4, No acute respiratory distress, cachectic Eyes: negative scleral hemorrhage, negative anisocoria, negative icterus ENT: Negative Runny nose, negative gingival bleeding, Neck:  Negative scars, masses, torticollis, lymphadenopathy, JVD, PORT-A-CATH right chest wall covered in clean no sign of infection Lungs: Clear to auscultation bilaterally without wheezes or crackles Cardiovascular: Regular rate and rhythm without murmur gallop or rub normal S1 and S2 Abdomen: negative abdominal pain, nondistended, positive soft, bowel sounds, no rebound, no ascites, no appreciable mass Extremities: No significant cyanosis, clubbing, or edema bilateral lower  extremities Skin: Negative rashes, lesions, ulcers Psychiatric:  Negative depression, negative anxiety, negative fatigue, negative mania  Central nervous system:  Cranial nerves II through XII intact, tongue/uvula midline, all extremities muscle strength 5/5, sensation intact throughout, negative dysarthria, negative expressive aphasia, negative receptive aphasia.  .     Data Reviewed: Care during the described time interval was provided by me .  I have reviewed this patient's available data, including medical history, events of note, physical examination, and all test results as part of my evaluation.  CBC: Recent Labs  Lab 08/30/22 0500 08/31/22 0558 09/01/22 0531  WBC 16.7* 16.9* 5.3  NEUTROABS 12.8* 14.8* 3.8  HGB 11.1* 11.1* 10.3*  HCT 32.2* 33.0* 30.5*  MCV 91.2 91.2 91.6  PLT 281 286 [id number]   Basic Metabolic Panel: Recent Labs  Lab 08/30/22 0500 08/31/22 0558 09/01/22 0531  NA 129* 133* 134*  K 3.6 2.9* 3.6  CL 100 99 103  CO2 '22 23 22  '$ GLUCOSE 96 114* 113*  BUN '10 13 15  '$ CREATININE 0.66 0.56 0.47  CALCIUM 7.5* 7.5* 7.3*  MG 1.8 1.9 2.0   GFR: Estimated Creatinine Clearance: 56.8 mL/min (by C-G formula based on SCr of 0.47 mg/dL). Liver Function Tests: Recent Labs  Lab 09/01/22 0531  AST 15  ALT 35  ALKPHOS 97  BILITOT 0.5  PROT 5.6*  ALBUMIN 2.8*   No results for input(s): "LIPASE", "AMYLASE" in the last 168 hours. No results for input(s): "AMMONIA" in the last 168 hours. Coagulation Profile: No results for input(s): "INR", "PROTIME" in the last 168 hours. Cardiac Enzymes: No results for input(s): "CKTOTAL", "CKMB", "CKMBINDEX", "TROPONINI" in the last 168 hours. BNP (last 3 results) No results for input(s): "PROBNP" in the last 8760 hours. HbA1C: No results for input(s): "HGBA1C" in the last 72 hours. CBG: No results for input(s): "GLUCAP" in the last 168 hours. Lipid Profile: No results for input(s): "CHOL", "HDL", "LDLCALC", "TRIG", "CHOLHDL",  "LDLDIRECT" in the last 72 hours. Thyroid Function Tests: No results for input(s): "TSH", "T4TOTAL", "FREET4", "T3FREE", "THYROIDAB" in the last 72 hours. Anemia Panel: No results for input(s): "VITAMINB12", "FOLATE", "FERRITIN", "TIBC", "IRON", "RETICCTPCT" in the last 72 hours. Sepsis Labs: No results for input(s): "PROCALCITON", "LATICACIDVEN" in the last 168 hours.  Recent Results (from the past 240 hour(s))  Fungus Culture With Stain     Status: None (Preliminary result)   Collection Time: 08/22/22 10:02 AM   Specimen: PATH Cytology Pleural fluid  Result Value Ref Range Status   Fungus Stain Final report  Final    Comment: (NOTE) Performed At: [facility] [place], [place] [id number] Rush Farmer MD [id number]    Fungus (Mycology) Culture PENDING  Incomplete   Fungal Source PLEURAL  Final    Comment: Performed at [facility], [place] [address] [place], [place] [address]  Fungus Culture Result     Status: None   Collection Time: 08/22/22 10:02 AM  Result Value Ref Range Status   Result 1 Comment  Final    Comment: (NOTE) KOH/Calcofluor preparation:  no fungus observed. Performed At: [facility] [place], [place] [id number] Rush Farmer MD [id number]          Radiology Studies: DG Abd 1 View  Result Date: 08/31/2022 CLINICAL DATA:  [id number] Intractable nausea and vomiting [id number] EXAM: ABDOMEN - 1 VIEW COMPARISON:  CT 01/27/2023 FINDINGS: Nonobstructive bowel gas pattern. Minimal stool burden. No radiopaque calculi overlie the kidneys. There is calcification overlying the left upper quadrant corresponding to a calcified splenic granuloma. No acute osseous abnormality. Degenerative changes of the spine prominent at L4-L5 and L5-S1. Sclerotic osseous metastatic lesion in the right ischial tuberosity and additional osseous lesions better seen on prior PET-CT. IMPRESSION: No evidence of bowel obstruction.  Electronically Signed   By: Maurine Simmering M.D.   On: 08/31/2022 12:17        Scheduled Meds:  aspirin EC  81 mg Oral Daily   Chlorhexidine Gluconate Cloth  6 each Topical Daily   clopidogrel  75 mg Oral Daily   dabrafenib mesylate  150 mg Oral Q12H   dexamethasone  2 mg Oral Daily   diclofenac Sodium  4 g Topical QID   enoxaparin (LOVENOX) injection  40 mg Subcutaneous Q24H   feeding supplement  1 Container Oral TID BM   fentaNYL  1 patch Transdermal [id number]   folic acid  1 mg Oral Daily   LORazepam  2 mg Sublingual TID AC & HS   OLANZapine zydis  10 mg Oral Daily   OLANZapine zydis  5 mg Oral QHS   pantoprazole (PROTONIX) IV  40 mg Intravenous Q12H   senna  1 tablet Oral QHS   sertraline  100 mg Oral BID   trametinib dimethyl sulfoxide  2 mg Oral Daily   Continuous Infusions:  sodium chloride 75 mL/hr at 09/01/22 0553   fosaprepitant (EMEND) 150 mg, dexamethasone (DECADRON) 12 mg in sodium chloride 0.9 % 145 mL IVPB     [START ON 09/02/2022] fosaprepitant (EMEND) 150 mg, dexamethasone (DECADRON) 12 mg in sodium chloride 0.9 % 145 mL IVPB     ondansetron Stopped (08/31/22 2145)     LOS: 10 days    Time spent:40 min    Arisbeth Purrington, Geraldo Docker, MD [facility]   If 7PM-7AM, please contact night-coverage 09/01/2022, 9:23 AM

## 2022-09-01 NOTE — Progress Notes (Signed)
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  She is s/p thoracentesis with resultant R sided hydropneumothorax. Pigtail catheter placed 08/24/22 and removed 08/28/22. 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    PT Comments    Pt agreeable to therapy. Pt ambulates in room without AD, good steadiness but reaching out for furniture and wall at times, maintains trunk slightly flexed reporting "a little" when asked about pain. Pt ambulates in hallway with IV pole, uses both hands to hold and push, good steadiness and able to navigate turns and around obstacles, no LOB, declines RW use.    Recommendations for follow up therapy are one component of a multi-disciplinary discharge planning process, led by the attending physician.  Recommendations may be updated based on patient status, additional functional criteria and insurance authorization.  Follow Up Recommendations  Home health PT     Assistance Recommended at Discharge Intermittent Supervision/Assistance  Patient can return home with the following A little help with walking and/or transfers;A little help with bathing/dressing/bathroom;Help with stairs or ramp for entrance;Assistance with cooking/housework   Equipment Recommendations  BSC/3in1 (rollator vs RW)    Recommendations for Other Services       Precautions / Restrictions Precautions Precautions: Fall Restrictions Weight Bearing Restrictions: No     Mobility  Bed Mobility Overal bed mobility: Modified Independent  General bed mobility comments: supine<>sit    Transfers Overall transfer level: Needs assistance Equipment used: None Transfers: Sit to/from Stand Sit to  Stand: Supervision  General transfer comment: supv for safety, pt powering to stand with BUE assisting and slow to rise    Ambulation/Gait Ambulation/Gait assistance: Supervision Gait Distance (Feet): 60 Feet Assistive device: IV Pole Gait Pattern/deviations: Step-to pattern, Decreased stride length Gait velocity: decreased  General Gait Details: trunk slightly flexed forward holding IV pole with both hands, decreased stride length and cadence   Stairs             Wheelchair Mobility    Modified Rankin (Stroke Patients Only)       Balance Overall balance assessment: Needs assistance Sitting-balance support: No upper extremity supported Sitting balance-Leahy Scale: Good  Standing balance support: No upper extremity supported, During functional activity Standing balance-Leahy Scale: Fair     Cognition Arousal/Alertness: Awake/alert Behavior During Therapy: WFL for tasks assessed/performed Overall Cognitive Status: Within Functional Limits for tasks assessed     Exercises      General Comments        Pertinent Vitals/Pain Pain Assessment Pain Assessment: Faces Faces Pain Scale: Hurts a little bit Pain Location: generalized Pain Descriptors / Indicators: Grimacing Pain Intervention(s): Limited activity within patient's tolerance, Monitored during session, Repositioned    Home Living                          Prior Function            PT Goals (current goals can now be found in the care plan section) Acute Rehab PT Goals Patient Stated Goal: more energy PT Goal Formulation: With patient Time For Goal Achievement: 09/13/22 Potential to Achieve Goals: Good Progress towards PT goals: Progressing toward goals    Frequency    Min 3X/week  PT Plan Current plan remains appropriate    Co-evaluation              AM-PAC PT "6 Clicks" Mobility   Outcome Measure  Help needed turning from your back to your side while in a flat bed  without using bedrails?: None Help needed moving from lying on your back to sitting on the side of a flat bed without using bedrails?: A Little Help needed moving to and from a bed to a chair (including a wheelchair)?: A Little Help needed standing up from a chair using your arms (e.g., wheelchair or bedside chair)?: A Little Help needed to walk in hospital room?: A Little Help needed climbing 3-5 steps with a railing? : A Lot 6 Click Score: 18    End of Session   Activity Tolerance: Patient tolerated treatment well Patient left: in bed;with call bell/phone within reach;with bed alarm set Nurse Communication: Mobility status PT Visit Diagnosis: Difficulty in walking, not elsewhere classified (R26.2);Unsteadiness on feet (R26.81)     Time: [id number] PT Time Calculation (min) (ACUTE ONLY): 21 min  Charges:  $Gait Training: 8-22 mins                      Tori Evoleth Nordmeyer PT, DPT 09/01/22, 1:38 PM

## 2022-09-01 NOTE — Progress Notes (Signed)
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 thoroughly discussed with the patient and questions answered. The benefits, risks, alternatives and complications were also discussed. The patient understands and wishes to proceed with the procedure. Written consent was obtained. Ultrasound was performed to localize and mark an adequate pocket of fluid in the right chest. The area was then prepped and draped in the normal sterile fashion. 1% Lidocaine was used for local anesthesia. Under ultrasound guidance a 6 Fr Safe-T-Centesis catheter was introduced. Thoracentesis was performed. The catheter was removed and a dressing  applied. FINDINGS: A total of approximately 420 mL of clear amber fluid was removed. Samples were sent to the laboratory as requested by the clinical team. IMPRESSION: Successful ultrasound guided right thoracentesis yielding 420 mL of pleural fluid. Post-procedure chest x-ray shows small apical pneumothorax most likely consistent with incomplete re-expansion due to known lung carcinoma. We will repeat chest x-ray  tomorrow morning to ensure stability. Procedure performed by: Gareth Eagle, PA-C Electronically Signed   By: Aletta Edouard M.D.   On: 08/22/2022 11:06   DG Chest 1 View  Result Date: 08/22/2022 CLINICAL DATA:  Status post right thoracentesis. EXAM: CHEST  1 VIEW COMPARISON:  08/21/2022 FINDINGS: Interval decreased right pleural effusion. Small right apical pneumothorax evident. Right suprahilar and parahilar airspace disease again noted with patchy subtle parahilar opacities in the left lung. The visualized bony structures of the thorax are unremarkable. Telemetry leads overlie the chest. IMPRESSION: 1. Interval decreased right pleural effusion status post thoracentesis. Small right apical pneumothorax. 2. Right suprahilar and parahilar airspace disease with patchy subtle parahilar opacities in the left lung. These results will be called to the ordering clinician or representative by the Radiologist Assistant, and communication documented in the PACS or [employer]. Electronically Signed   By: Misty Stanley M.D.   On: 08/22/2022 10:28   CT Angio Chest PE W and/or Wo Contrast  Result Date: 08/21/2022 CLINICAL DATA:  Concern for pulmonary embolism. History hands cold post noted it cold EXAM: CT ANGIOGRAPHY CHEST WITH CONTRAST TECHNIQUE: Multidetector CT imaging of the chest was performed using the standard protocol during bolus administration of intravenous contrast. Multiplanar CT image reconstructions and MIPs were obtained to evaluate the vascular anatomy. RADIATION DOSE REDUCTION: This exam was performed according to the departmental dose-optimization program which includes automated exposure control, adjustment of the mA and/or kV according to patient size and/or use of iterative reconstruction technique. CONTRAST:  72m OMNIPAQUE IOHEXOL 350 MG/ML SOLN COMPARISON:  Chest CT dated 06/25/2022 and radiograph dated 08/21/2022. FINDINGS: Evaluation of this exam is limited due to respiratory motion  artifact. Cardiovascular: There is no cardiomegaly small pericardial effusion, new since the prior CT and measuring 1 cm in thickness. The thoracic aorta is unremarkable. The origins of the great vessels of the aortic arch appear patent. Evaluation of the pulmonary arteries is limited due to respiratory motion. No central pulmonary artery embolus identified. Mediastinum/Nodes: There is infiltrative soft tissue the mediastinum and bilateral hila in keeping with known malignancy. No mediastinal fluid collection. Right-sided Port-A-Cath with tip at the cavoatrial junction. Lungs/Pleura: There is a moderate size right and trace left pleural effusions, new since the prior CT and suspicious for malignant effusion. There is partial compressive atelectasis of the right lower lobe. Patchy area of consolidation in the right perihilar region extending into the right upper lobe and right middle lobes may represent combination of tumor infiltration, atelectasis, or pneumonia. There is however diffuse interstitial thickening and nodularity primarily involving the upper lobes bilaterally as well as in the lower lobes suspicious for lymphangitic carcinomatosis. There is no pneumothorax. There is narrowing of the right upper lobe bronchus as well as narrowing of the right middle and right lower lobe bronchi. The central airways remain patent. Upper Abdomen: Small calcified splenic granuloma. Musculoskeletal: Sclerotic lesions involving T10, T12, and L3 consistent with metastatic disease. No acute osseous pathology. Review of the MIP images confirms the above findings. IMPRESSION: 1. No CT evidence of central pulmonary artery embolus. 2. Moderate size right and trace left pleural effusions,  new since the prior CT and suspicious for malignant effusion. 3. Infiltrative mediastinal and hilar mass in keeping with known malignancy. 4. Significant progression of disease with bilateral perihilar consolidation and findings 5. suspicious for  lymphangitic carcinomatosis. 6. Sclerotic lesions involving T10, T12, and L3 consistent with metastatic disease. Electronically Signed   By: Anner Crete M.D.   On: 08/21/2022 20:53   DG Chest Port 1 View  Result Date: 08/21/2022 CLINICAL DATA:  Shortness of breath EXAM: PORTABLE CHEST 1 VIEW COMPARISON:  PET-CT dated July 29, 2022 and CT examination dated August 13, 2022 FINDINGS: The heart is normal in size. Pulmonary vascular congestion with bilateral perihilar opacities consistent with patient's known hilar lymphadenopathy. Small bilateral pleural effusions. Right IJ access MediPort with distal tip in the SVC no acute osseous abnormality IMPRESSION: 1. Pulmonary vascular congestion with bilateral perihilar opacities consistent with patient's known hilar lymphadenopathy. 2. Small bilateral pleural effusions. Electronically Signed   By: Keane Police D.O.   On: 08/21/2022 19:59   VAS [place] LOWER EXTREMITY VENOUS (DVT)  Result Date: 08/17/2022  Lower Venous DVT Study Patient Name:  BAYE SCHNOOR  Date of Exam:   08/17/2022 Medical Rec #: [id number]               Accession #:    [id number] Date of Birth: 07-01-1965                Patient Gender: F Patient Age:   59 years Exam Location:  [facility] Procedure:      VAS [place] LOWER EXTREMITY VENOUS (DVT) Referring Phys: Lottie Mussel --------------------------------------------------------------------------------  Other Indications: Stroke, primary lung adenocarcinoma. Comparison Study: No prior study. Performing Technologist: McKayla Maag RVT, [place]  Examination Guidelines: A complete evaluation includes B-mode imaging, spectral Doppler, color Doppler, and power Doppler as needed of all accessible portions of each vessel. Bilateral testing is considered an integral part of a complete examination. Limited examinations for reoccurring indications may be performed as noted. The reflux portion of the exam is performed with the patient in reverse  Trendelenburg.  +---------+---------------+---------+-----------+----------+--------------+ RIGHT    CompressibilityPhasicitySpontaneityPropertiesThrombus Aging +---------+---------------+---------+-----------+----------+--------------+ CFV      Full           Yes      Yes                                 +---------+---------------+---------+-----------+----------+--------------+ SFJ      Full                                                        +---------+---------------+---------+-----------+----------+--------------+ FV Prox  Full                                                        +---------+---------------+---------+-----------+----------+--------------+ FV Mid   Full                                                        +---------+---------------+---------+-----------+----------+--------------+  FV DistalFull                                                        +---------+---------------+---------+-----------+----------+--------------+ PFV      Full                                                        +---------+---------------+---------+-----------+----------+--------------+ POP      Full           Yes      Yes                                 +---------+---------------+---------+-----------+----------+--------------+ PTV      Full                                                        +---------+---------------+---------+-----------+----------+--------------+ PERO     Full                                                        +---------+---------------+---------+-----------+----------+--------------+   +---------+---------------+---------+-----------+----------+--------------+ LEFT     CompressibilityPhasicitySpontaneityPropertiesThrombus Aging +---------+---------------+---------+-----------+----------+--------------+ CFV      Full           Yes      Yes                                  +---------+---------------+---------+-----------+----------+--------------+ SFJ      Full                                                        +---------+---------------+---------+-----------+----------+--------------+ FV Prox  Full                                                        +---------+---------------+---------+-----------+----------+--------------+ FV Mid   Full                                                        +---------+---------------+---------+-----------+----------+--------------+ FV DistalFull                                                        +---------+---------------+---------+-----------+----------+--------------+  PFV      Full                                                        +---------+---------------+---------+-----------+----------+--------------+ POP      Full           Yes      Yes                                 +---------+---------------+---------+-----------+----------+--------------+ PTV      Full                                                        +---------+---------------+---------+-----------+----------+--------------+ PERO     Full                                                        +---------+---------------+---------+-----------+----------+--------------+     Summary: BILATERAL: - No evidence of deep vein thrombosis seen in the lower extremities, bilaterally. - No evidence of superficial venous thrombosis in the lower extremities, bilaterally. -No evidence of popliteal cyst, bilaterally.   *See table(s) above for measurements and observations. Electronically signed by Deitra Mayo MD on 08/17/2022 at 7:34:49 PM.    Final    IR IMAGING GUIDED PORT INSERTION  Result Date: 08/17/2022 INDICATION: Chemotherapy access EXAM: Chest port placement using ultrasound and fluoroscopic guidance MEDICATIONS: Documented in the EMR ANESTHESIA/SEDATION: Moderate (conscious) sedation was employed during this  procedure. A total of Versed 2 mg and Fentanyl 100 mcg was administered intravenously. Moderate Sedation Time: 32 minutes. The patient's level of consciousness and vital signs were monitored continuously by radiology nursing throughout the procedure under my direct supervision. FLUOROSCOPY TIME:  Fluoroscopy Time: 0.1 minutes (1 mGy) COMPLICATIONS: None immediate. PROCEDURE: Informed written consent was obtained from the patient after a thorough discussion of the procedural risks, benefits and alternatives. All questions were addressed. Maximal Sterile Barrier Technique was utilized including caps, mask, sterile gowns, sterile gloves, sterile drape, hand hygiene and skin antiseptic. A timeout was performed prior to the initiation of the procedure. The patient was placed supine on the exam table. The right neck and chest was prepped and draped in the standard sterile fashion. A preliminary ultrasound of the right neck was performed and demonstrates a patent right internal jugular vein. A permanent ultrasound image was stored in the electronic medical record. The overlying skin was anesthetized with 1% Lidocaine. Using ultrasound guidance, access was obtained into the right internal jugular vein using a 21 gauge micropuncture set. A wire was advanced into the SVC, a short incision was made at the puncture site, and serial dilatation performed. Next, in an ipsilateral infraclavicular location, an incision was made at the site of the subcutaneous reservoir. Blunt dissection was used to open a pocket to contain the reservoir. A subcutaneous tunnel was then created from the port site to the puncture site. A(n) 8 Fr single lumen catheter was advanced through  the tunnel. The catheter was attached to the port and this was placed in the subcutaneous pocket. Under fluoroscopic guidance, a peel away sheath was placed, and the catheter was trimmed to the appropriate length and was advanced into the central veins. The catheter  length is 23 cm. The tip of the catheter lies near the superior cavoatrial junction. The port flushes and aspirates appropriately. The port was flushed and locked with heparinized saline. The port pocket was closed in 2 layers using 3-0 and 4-0 Vicryl/absorbable suture. Dermabond was also applied to both incisions. The patient tolerated the procedure well and was transferred to recovery in stable condition. IMPRESSION: Successful placement of a right-sided chest port via the right internal jugular vein. The port is ready for immediate use. Electronically Signed   By: Albin Felling M.D.   On: 08/17/2022 16:49   ECHO TEE  Result Date: 08/17/2022    TRANSESOPHOGEAL ECHO REPORT   Patient Name:   MALARY MABIN Date of Exam: 08/17/2022 Medical Rec #:  [id number]              Height:       62.0 in Accession #:    [id number]             Weight:       98.1 lb Date of Birth:  1965-09-12               BSA:          1.412 m Patient Age:    69 years               BP:           119/82 mmHg Patient Gender: F                      HR:           88 bpm. Exam Location:  Inpatient Procedure: Transesophageal Echo, Color Doppler, Cardiac Doppler and Saline            Contrast Bubble Study Indications:     Stroke  History:         Patient has prior history of Echocardiogram examinations, most                  recent 08/14/2022. Risk Factors:Hypertension. Latent TB,                  metastatic lung adenocarcinoma.  Sonographer:     Eartha Inch Referring Phys:  [id number] Margie Billet Diagnosing Phys: Oswaldo Milian MD PROCEDURE: After discussion of the risks and benefits of a TEE, an informed consent was obtained from the patient. The transesophogeal probe was passed without difficulty through the esophogus of the patient. Imaged were obtained with the patient in a left lateral decubitus position. Sedation performed by different physician. The patient was monitored while under deep sedation. Image quality was good. The  patient's vital signs; including heart rate, blood pressure, and oxygen saturation; remained stable throughout the procedure. The patient developed no complications during the procedure.  IMPRESSIONS  1. Left ventricular ejection fraction, by estimation, is 60 to 65%. The left ventricle has normal function.  2. Right ventricular systolic function is normal. The right ventricular size is normal.  3. No left atrial/left atrial appendage thrombus was detected.  4. Moderate pericardial effusion. Moderate pleural effusion.  5. The mitral valve is normal in structure. Trivial mitral valve regurgitation.  6. Tricuspid valve regurgitation is mild  to moderate.  7. The aortic valve is tricuspid. Aortic valve regurgitation is not visualized. No aortic stenosis is present.  8. Evidence of atrial level shunting detected by color flow Doppler. Agitated saline contrast bubble study was positive with shunting observed after >6 cardiac cycles suggestive of intrapulmonary shunting. Conclusion(s)/Recommendation(s): No intracardiac source of embolism detected on this on this transesophageal echocardiogram. FINDINGS  Left Ventricle: Left ventricular ejection fraction, by estimation, is 60 to 65%. The left ventricle has normal function. The left ventricular internal cavity size was normal in size. Right Ventricle: The right ventricular size is normal. No increase in right ventricular wall thickness. Right ventricular systolic function is normal. Left Atrium: Left atrial size was normal in size. No left atrial/left atrial appendage thrombus was detected. Right Atrium: Right atrial size was normal in size. Pericardium: A moderately sized pericardial effusion is present. Mitral Valve: The mitral valve is normal in structure. Trivial mitral valve regurgitation. Tricuspid Valve: The tricuspid valve is normal in structure. Tricuspid valve regurgitation is mild to moderate. Aortic Valve: The aortic valve is tricuspid. Aortic valve regurgitation  is not visualized. No aortic stenosis is present. Pulmonic Valve: The pulmonic valve was not well visualized. Pulmonic valve regurgitation is trivial. Aorta: The aortic root and ascending aorta are structurally normal, with no evidence of dilitation. IAS/Shunts: Evidence of atrial level shunting detected by color flow Doppler. Agitated saline contrast was given intravenously to evaluate for intracardiac shunting. Agitated saline contrast bubble study was positive with shunting observed after >6 cardiac cycles suggestive of intrapulmonary shunting. Additional Comments: There is a moderate pleural effusion. Spectral Doppler performed. TRICUSPID VALVE TR Peak grad:   31.6 mmHg TR Mean grad:   21.0 mmHg TR Vmax:        281.00 cm/s TR Vmean:       218.0 cm/s Oswaldo Milian MD Electronically signed by Oswaldo Milian MD Signature Date/Time: 08/17/2022/1:59:41 PM    Final (Updated)    ECHOCARDIOGRAM COMPLETE  Result Date: 08/14/2022    ECHOCARDIOGRAM REPORT   Patient Name:   DEANDA BARSON Date of Exam: 08/14/2022 Medical Rec #:  [id number]              Height:       62.0 in Accession #:    [id number]             Weight:       98.0 lb Date of Birth:  Jun 07, 1966               BSA:          1.412 m Patient Age:    60 years               BP:           134/68 mmHg Patient Gender: F                      HR:           74 bpm. Exam Location:  Inpatient Procedure: 2D Echo, Cardiac Doppler and Color Doppler Indications:    I63.9 Stroke  History:        Patient has prior history of Echocardiogram examinations, most                 recent 05/29/2021. Stroke; Risk Factors:Non-Smoker and                 Hypertension.  Sonographer:    Wilkie Aye RVT RCS Referring Phys: [phone]  EMILY B MULLEN  Sonographer Comments: Some images are off axis IMPRESSIONS  1. Left ventricular ejection fraction, by estimation, is 65 to 70%. The left ventricle has normal function. The left ventricle has no regional wall motion abnormalities.  Left ventricular diastolic parameters are consistent with Grade I diastolic dysfunction (impaired relaxation).  2. Right ventricular systolic function is normal. The right ventricular size is normal.  3. Moderate pericardial effusion. The pericardial effusion is circumferential. There is no evidence of cardiac tamponade.  4. The mitral valve is normal in structure. Trivial mitral valve regurgitation.  5. The aortic valve is tricuspid. Aortic valve regurgitation is not visualized. No aortic stenosis is present.  6. The inferior vena cava is normal in size with greater than 50% respiratory variability, suggesting right atrial pressure of 3 mmHg. Comparison(s): Compared to prior TTE on 05/2021, a moderate sized pericardial effusion is now present. Conclusion(s)/Recommendation(s): No intracardiac source of embolism detected on this transthoracic study. Consider a transesophageal echocardiogram to exclude cardiac source of embolism if clinically indicated. FINDINGS  Left Ventricle: Left ventricular ejection fraction, by estimation, is 65 to 70%. The left ventricle has normal function. The left ventricle has no regional wall motion abnormalities. The left ventricular internal cavity size was normal in size. There is  no left ventricular hypertrophy. Left ventricular diastolic parameters are consistent with Grade I diastolic dysfunction (impaired relaxation). Right Ventricle: The right ventricular size is normal. No increase in right ventricular wall thickness. Right ventricular systolic function is normal. Left Atrium: Left atrial size was normal in size. Right Atrium: Right atrial size was normal in size. Pericardium: A moderately sized pericardial effusion is present. The pericardial effusion is circumferential. There is no evidence of cardiac tamponade. Mitral Valve: The mitral valve is normal in structure. Trivial mitral valve regurgitation. Tricuspid Valve: The tricuspid valve is normal in structure. Tricuspid valve  regurgitation is mild. Aortic Valve: The aortic valve is tricuspid. Aortic valve regurgitation is not visualized. No aortic stenosis is present. Aortic valve mean gradient measures 3.0 mmHg. Aortic valve peak gradient measures 6.2 mmHg. Aortic valve area, by VTI measures 2.21 cm. Pulmonic Valve: The pulmonic valve was normal in structure. Pulmonic valve regurgitation is trivial. Aorta: The aortic root is normal in size and structure. Venous: The inferior vena cava is normal in size with greater than 50% respiratory variability, suggesting right atrial pressure of 3 mmHg. IAS/Shunts: The atrial septum is grossly normal.  LEFT VENTRICLE PLAX 2D LVIDd:         3.90 cm   Diastology LVIDs:         2.40 cm   LV e' medial:    5.33 cm/s LV PW:         1.00 cm   LV E/e' medial:  11.0 LV IVS:        0.90 cm   LV e' lateral:   9.46 cm/s LVOT diam:     1.80 cm   LV E/e' lateral: 6.2 LV SV:         53 LV SV Index:   38 LVOT Area:     2.54 cm  RIGHT VENTRICLE             IVC RV S prime:     15.30 cm/s  IVC diam: 1.10 cm TAPSE (M-mode): 2.1 cm LEFT ATRIUM             Index        RIGHT ATRIUM  Index LA diam:        2.40 cm 1.70 cm/m   RA Area:     9.25 cm LA Vol (A2C):   21.7 ml 15.37 ml/m  RA Volume:   18.95 ml 13.42 ml/m LA Vol (A4C):   31.5 ml 22.31 ml/m LA Biplane Vol: 28.3 ml 20.05 ml/m  AORTIC VALVE                    PULMONIC VALVE AV Area (Vmax):    2.09 cm     PV Vmax:          0.77 m/s AV Area (Vmean):   2.02 cm     PV Peak grad:     2.4 mmHg AV Area (VTI):     2.21 cm     PR End Diast Vel: 6.86 msec AV Vmax:           124.00 cm/s AV Vmean:          83.300 cm/s AV VTI:            0.241 m AV Peak Grad:      6.2 mmHg AV Mean Grad:      3.0 mmHg LVOT Vmax:         102.00 cm/s LVOT Vmean:        66.000 cm/s LVOT VTI:          0.209 m LVOT/AV VTI ratio: 0.87  AORTA Ao Root diam: 2.60 cm Ao Asc diam:  2.60 cm MITRAL VALVE               TRICUSPID VALVE MV Area (PHT): 2.99 cm    TR Peak grad:   17.5 mmHg MV  Decel Time: 254 msec    TR Vmax:        209.00 cm/s MV E velocity: 58.60 cm/s MV A velocity: 89.70 cm/s  SHUNTS MV E/A ratio:  0.65        Systemic VTI:  0.21 m                            Systemic Diam: 1.80 cm Gwyndolyn Kaufman MD Electronically signed by Gwyndolyn Kaufman MD Signature Date/Time: 08/14/2022/2:29:20 PM    Final    CT ANGIO HEAD NECK W WO CM  Result Date: 08/13/2022 CLINICAL DATA:  Stroke follow-up EXAM: CT ANGIOGRAPHY HEAD AND NECK TECHNIQUE: Multidetector CT imaging of the head and neck was performed using the standard protocol during bolus administration of intravenous contrast. Multiplanar CT image reconstructions and MIPs were obtained to evaluate the vascular anatomy. Carotid stenosis measurements (when applicable) are obtained utilizing NASCET criteria, using the distal internal carotid diameter as the denominator. RADIATION DOSE REDUCTION: This exam was performed according to the departmental dose-optimization program which includes automated exposure control, adjustment of the mA and/or kV according to patient size and/or use of iterative reconstruction technique. CONTRAST:  69m OMNIPAQUE IOHEXOL 350 MG/ML SOLN COMPARISON:  07/01/2022 FINDINGS: CT HEAD FINDINGS Brain: There is no mass, hemorrhage or extra-axial collection. The size and configuration of the ventricles and extra-axial CSF spaces are normal. There is no acute or chronic infarction. The brain parenchyma is normal. Skull: The visualized skull base, calvarium and extracranial soft tissues are normal. Sinuses/Orbits: No fluid levels or advanced mucosal thickening of the visualized paranasal sinuses. No mastoid or middle ear effusion. The orbits are normal. CTA NECK FINDINGS SKELETON: There is no bony spinal canal stenosis. No lytic  or blastic lesion. OTHER NECK: Normal pharynx, larynx and major salivary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

## 2022-09-01 NOTE — Progress Notes (Signed)
Karen [name] was not up for a visit when Karen [name] arrived. She desires Chaplain to come back later.   Chaplain is closing consult, but will follow-up.     09/01/22 1400  Spiritual Encounters  Type of Visit Initial  Care provided to: Patient;Pt not available

## 2022-09-02 DIAGNOSIS — E43 Unspecified severe protein-calorie malnutrition: Secondary | ICD-10-CM

## 2022-09-02 DIAGNOSIS — J910 Malignant pleural effusion: Secondary | ICD-10-CM | POA: Diagnosis not present

## 2022-09-02 DIAGNOSIS — Z7189 Other specified counseling: Secondary | ICD-10-CM | POA: Diagnosis not present

## 2022-09-02 DIAGNOSIS — Z515 Encounter for palliative care: Secondary | ICD-10-CM | POA: Diagnosis not present

## 2022-09-02 DIAGNOSIS — C3490 Malignant neoplasm of unspecified part of unspecified bronchus or lung: Secondary | ICD-10-CM | POA: Diagnosis not present

## 2022-09-02 DIAGNOSIS — R112 Nausea with vomiting, unspecified: Secondary | ICD-10-CM | POA: Diagnosis not present

## 2022-09-02 DIAGNOSIS — J90 Pleural effusion, not elsewhere classified: Secondary | ICD-10-CM | POA: Diagnosis not present

## 2022-09-02 DIAGNOSIS — F411 Generalized anxiety disorder: Secondary | ICD-10-CM

## 2022-09-02 DIAGNOSIS — R630 Anorexia: Secondary | ICD-10-CM | POA: Diagnosis not present

## 2022-09-02 LAB — COMPREHENSIVE METABOLIC PANEL
ALT: 38 U/L (ref 0–44)
AST: 23 U/L (ref 15–41)
Albumin: 2.8 g/dL — ABNORMAL LOW (ref 3.5–5.0)
Alkaline Phosphatase: 112 U/L (ref 38–126)
Anion gap: 6 (ref 5–15)
BUN: 15 mg/dL (ref 6–20)
CO2: 20 mmol/L — ABNORMAL LOW (ref 22–32)
Calcium: 7.6 mg/dL — ABNORMAL LOW (ref 8.9–10.3)
Chloride: 106 mmol/L (ref 98–111)
Creatinine, Ser: 0.47 mg/dL (ref 0.44–1.00)
GFR, Estimated: >60 mL/min (ref >60)
Glucose, Bld: 95 mg/dL (ref 70–99)
Potassium: 4.2 mmol/L (ref 3.5–5.1)
Sodium: 132 mmol/L — ABNORMAL LOW (ref 135–145)
Total Bilirubin: 0.3 mg/dL (ref 0.3–1.2)
Total Protein: 5.2 g/dL — ABNORMAL LOW (ref 6.5–8.1)

## 2022-09-02 LAB — FUNGUS CULTURE WITH STAIN

## 2022-09-02 LAB — CBC WITH DIFFERENTIAL/PLATELET
Abs Immature Granulocytes: 0.02 10*3/uL (ref 0.00–0.07)
Basophils Absolute: 0 10*3/uL (ref 0.0–0.1)
Basophils Relative: 0 %
Eosinophils Absolute: 0 10*3/uL (ref 0.0–0.5)
Eosinophils Relative: 0 %
HCT: 28.9 % — ABNORMAL LOW (ref 36.0–46.0)
Hemoglobin: 9.6 g/dL — ABNORMAL LOW (ref 12.0–15.0)
Immature Granulocytes: 0 %
Lymphocytes Relative: 36 %
Lymphs Abs: 2.3 10*3/uL (ref 0.7–4.0)
MCH: 30.9 pg (ref 26.0–34.0)
MCHC: 33.2 g/dL (ref 30.0–36.0)
MCV: 92.9 fL (ref 80.0–100.0)
Monocytes Absolute: 0.3 10*3/uL (ref 0.1–1.0)
Monocytes Relative: 5 %
Neutro Abs: 3.7 10*3/uL (ref 1.7–7.7)
Neutrophils Relative %: 59 %
Platelets: 358 10*3/uL (ref 150–400)
RBC: 3.11 MIL/uL — ABNORMAL LOW (ref 3.87–5.11)
RDW: 13.2 % (ref 11.5–15.5)
WBC: 6.4 10*3/uL (ref 4.0–10.5)
nRBC: 0 % (ref 0.0–0.2)

## 2022-09-02 LAB — MAGNESIUM: Magnesium: 2 mg/dL (ref 1.7–2.4)

## 2022-09-02 LAB — IRON AND TIBC
Iron: 80 ug/dL (ref 28–170)
Saturation Ratios: 38 % — ABNORMAL HIGH (ref 10.4–31.8)
TIBC: 209 ug/dL — ABNORMAL LOW (ref 250–450)
UIBC: 129 ug/dL

## 2022-09-02 LAB — LIPID PANEL
Cholesterol: 124 mg/dL (ref 0–200)
HDL: 46 mg/dL (ref >40)
LDL Cholesterol: 49 mg/dL (ref 0–99)
Total CHOL/HDL Ratio: 2.7 RATIO
Triglycerides: 146 mg/dL (ref <150)
VLDL: 29 mg/dL (ref 0–40)

## 2022-09-02 LAB — FUNGUS CULTURE RESULT

## 2022-09-02 LAB — RETICULOCYTES
Immature Retic Fract: 12.8 % (ref 2.3–15.9)
RBC.: 3.11 MIL/uL — ABNORMAL LOW (ref 3.87–5.11)
Retic Count, Absolute: 70 10*3/uL (ref 19.0–186.0)
Retic Ct Pct: 2.3 % (ref 0.4–3.1)

## 2022-09-02 LAB — PHOSPHORUS: Phosphorus: 1.5 mg/dL — ABNORMAL LOW (ref 2.5–4.6)

## 2022-09-02 LAB — FERRITIN: Ferritin: 211 ng/mL (ref 11–307)

## 2022-09-02 LAB — FUNGAL ORGANISM REFLEX

## 2022-09-02 LAB — FOLATE: Folate: 8.9 ng/mL (ref >5.9)

## 2022-09-02 LAB — VITAMIN B12: Vitamin B-12: 957 pg/mL — ABNORMAL HIGH (ref 180–914)

## 2022-09-02 MED ORDER — FENTANYL 25 MCG/HR TD PT72
1.0000 | MEDICATED_PATCH | TRANSDERMAL | Status: DC
  Administered 2022-09-02 – 2022-09-05 (×2): 1 via TRANSDERMAL
  Filled 2022-09-02 (×2): qty 1

## 2022-09-02 MED ORDER — DICYCLOMINE HCL 10 MG PO CAPS
10.0000 mg | ORAL_CAPSULE | Freq: Once | ORAL | Status: AC
  Administered 2022-09-02: 10 mg via ORAL
  Filled 2022-09-02: qty 1

## 2022-09-02 MED ORDER — SODIUM PHOSPHATES 45 MMOLE/15ML IV SOLN
30.0000 mmol | Freq: Once | INTRAVENOUS | Status: AC
  Administered 2022-09-02: 30 mmol via INTRAVENOUS
  Filled 2022-09-02: qty 10

## 2022-09-02 MED ORDER — TRAZODONE HCL 50 MG PO TABS
150.0000 mg | ORAL_TABLET | Freq: Every evening | ORAL | Status: DC | PRN
  Administered 2022-09-02 – 2022-09-06 (×5): 150 mg via ORAL
  Filled 2022-09-02 (×5): qty 3

## 2022-09-02 MED ORDER — ONDANSETRON 4 MG PO TBDP
8.0000 mg | ORAL_TABLET | Freq: Two times a day (BID) | ORAL | Status: DC
  Administered 2022-09-02 – 2022-09-07 (×10): 8 mg via ORAL
  Filled 2022-09-02 (×10): qty 2

## 2022-09-02 MED ORDER — MEGESTROL ACETATE 400 MG/10ML PO SUSP
400.0000 mg | Freq: Two times a day (BID) | ORAL | Status: DC
  Administered 2022-09-02 – 2022-09-07 (×9): 400 mg via ORAL
  Filled 2022-09-02 (×10): qty 10

## 2022-09-02 NOTE — Progress Notes (Signed)
PROGRESS NOTE    Karen [name]  [id number] DOB: 1965/10/29 DOA: 08/21/2022 PCP: Starlyn Skeans, MD     Brief Narrative:  57 year old HF PMHx primary language Spanish (understands significant amount of English) non-small cell lung cancer on chemotherapy, chronic back pain and essential HTN, who underwent first round of induction chemotherapy with carboplatin and Alimta on 08/18/22 and since then started having intractable nausea, vomiting chest pain and shortness of breath and unable to tolerate oral medications and diet   Admitted. She was found to have sided pleural effusion which was drained resulting in pneumothorax which was managed by pigtail catheter placement on 08/24/2022 by IR. Pulmonary and palliative care were consulted. Pigtail catheter removed on 08/28/2022.    Subjective: 3/14 afebrile overnight A/O x 4.  Patient much more alert today.  States ambulated a little bit around room.  States attempting to consume meals but only able to eat a little at a time.  States understands she has lost significant amount of weight.  Negative chest pain, negative SOB.   Assessment & Plan: Covid vaccination;   Principal Problem:   Pleural effusion on right Active Problems:   Intractable nausea and vomiting   Primary adenocarcinoma of lung (HCC)   Cancer associated pain   Major depression, recurrent, chronic (HCC)   History of ischemic stroke   Goals of care, counseling/discussion   Malignant pleural effusion   Protein-calorie malnutrition, severe (HCC)  Metastatic non-small cell lung cancer -On chemotherapy -Seen by Dr. Sullivan Lone Oncology evaluated the patient during this hospitalization.  Outpatient follow-up with oncology. -Overall prognosis is guarded to poor.   -3/12 per Dr. Sullivan Lone oncology started on Dabrafenib plus Trametinib towards treatment of her metastatic BRAF mutated lung adenocarcinoma.  -3/12per Dr. Sullivan Lone oncology dexamethasone 2 mg daily to suppress  medication related fever oncology to wean off as outpatient. -3/13 palliative care evaluated continue current treatment.  Full code.  Patient would be a good hospice candidate however per palliative care TBD.   -3/14Per Oncology  - have to get more active and be able to nutritionally sustain herself to benefit from palliative treatments.  Right-sided pleural effusion  -S/p Thoracentesis with resultant right-sided hydropneumothorax -3/5 s/p.  Pigtail catheter placed by IR  -3/9 PCCM removed pigtail catheter  -Per PCCM follow-up as outpatient upon discharge.  Signed off 3/11 -3/14 obtain follow-up PCXR in a.m.  Intractable nausea and vomiting -Most likely secondary to chemotherapy - Compazine 10 mg IV PRN.  Will discuss with patient will have to find some PO regimen to control her N/V - Lorazepam 2 mg TID -Emend  150 mg+ dexamethasone 12 mg x 2 more days - Zofran IV 8 mg BID -3/14 resolved  Severe cancer pain  - 3/13 pain controlled today per patient - 3/14 increase Fentanyl patch 25 mcg  Major depression -Zyprexa 10 mg BID  Leukocytosis - Resolved  Hyponatremia - Poor oral intake, most likely component of dehydration - Normal saline 70m/hr  Hypokalemia - Potassium goal >4 -3/13 K-Dur 40 mEq x 2 doses  Hypocalcemia - Calcium goal> 8.9 - 3/13 corrected calcium= 8.2 - 3/13 Calcium Gluconate 2 g  Hypophosphatemia -Phosphorus goal> 2.5 - 3/14 sodium phosphate 30 mmol   Hx ischemic stroke in 07/2022 --ASA 81 mg daily - Plavix 75 mg daily  HLD - 3/13 lipid panel pending -Patient currently not on statin will start Lipitor in a.m.   Anemia of chronic disease? -Transfuse for hemoglobin<7 -3/13 anemia panel pending  Severe protein  calorie malnutrition - 3/14 patient informed family can bring any food into hospital for her that she will eat. - 3/14 Megace 400 mg BID    Physical deconditioning -PT recommends home health PT      Mobility Assessment (last 72  hours)     Mobility Assessment     Row Name 09/01/22 2117 09/01/22 1336 08/30/22 2200       Does patient have an order for bedrest or is patient medically unstable No - Continue assessment -- No - Continue assessment     What is the highest level of mobility based on the progressive mobility assessment? Level 6 (Walks independently in room and hall) - Balance while walking in room without assist - Complete Level 5 (Walks with assist in room/hall) - Balance while stepping forward/back and can walk in room with assist - Complete Level 5 (Walks with assist in room/hall) - Balance while stepping forward/back and can walk in room with assist - Complete                    DVT prophylaxis: Lovenox Code Status: Full Family Communication:  Status is: Inpatient    Dispo: The patient is from: Home              Anticipated d/c is to:  ???              Anticipated d/c date is: > 3 days              Patient currently is not medically stable to d/c.      Consultants:  Oncology Dr.Mitra Lucky Rathke   Procedures/Significant Events:    I have personally reviewed and interpreted all radiology studies and my findings are as above.  VENTILATOR SETTINGS:    Cultures   Antimicrobials: Anti-infectives (From admission, onward)    None         Devices    LINES / TUBES:      Continuous Infusions:  sodium chloride 75 mL/hr at 09/02/22 0536     Objective: Vitals:   09/01/22 0523 09/01/22 2128 09/02/22 0536 09/02/22 1316  BP: 132/88 (!) 140/95 (!) 134/95 (!) 143/83  Pulse: 95 88 89 95  Resp: '17 17 17 20  '$ Temp: 98.1 F (36.7 C) 97.9 F (36.6 C) 98.1 F (36.7 C) 98.5 F (36.9 C)  TempSrc: Oral Oral Oral Oral  SpO2: 97% 98% 95% 93%  Weight:      Height:        Intake/Output Summary (Last 24 hours) at 09/02/2022 2139 Last data filed at 09/02/2022 2007 Gross per 24 hour  Intake 725.92 ml  Output --  Net 725.92 ml   Filed Weights   08/21/22 1919 08/22/22 0146   Weight: 46.3 kg 45.8 kg   Physical Exam:  General: A/O x 4, No acute respiratory distress, severe cachectic Eyes: negative scleral hemorrhage, negative anisocoria, negative icterus ENT: Negative Runny nose, negative gingival bleeding, Neck:  Negative scars, masses, torticollis, lymphadenopathy, JVD,PORT-A-CATH right chest wall covered in clean no sign of infection Lungs: Clear to auscultation bilaterally without wheezes or crackles Cardiovascular: Regular rate and rhythm without murmur gallop or rub normal S1 and S2 Abdomen: negative abdominal pain, nondistended, positive soft, bowel sounds, no rebound, no ascites, no appreciable mass Extremities: No significant cyanosis, clubbing, or edema bilateral lower extremities Skin: Negative rashes, lesions, ulcers Psychiatric:  Negative depression, negative anxiety, negative fatigue, negative mania  Central nervous system:  Cranial nerves II through XII intact, tongue/uvula  midline, all extremities muscle strength 5/5, sensation intact throughout, negative dysarthria, negative expressive aphasia, negative receptive aphasia.   .     Data Reviewed: Care during the described time interval was provided by me .  I have reviewed this patient's available data, including medical history, events of note, physical examination, and all test results as part of my evaluation.  CBC: Recent Labs  Lab 08/30/22 0500 08/31/22 0558 09/01/22 0531 09/02/22 0528  WBC 16.7* 16.9* 5.3 6.4  NEUTROABS 12.8* 14.8* 3.8 3.7  HGB 11.1* 11.1* 10.3* 9.6*  HCT 32.2* 33.0* 30.5* 28.9*  MCV 91.2 91.2 91.6 92.9  PLT 281 286 321 [id number]   Basic Metabolic Panel: Recent Labs  Lab 08/30/22 0500 08/31/22 0558 09/01/22 0531 09/02/22 0528  NA 129* 133* 134* 132*  K 3.6 2.9* 3.6 4.2  CL 100 99 103 106  CO2 '22 23 22 '$ 20*  GLUCOSE 96 114* 113* 95  BUN '10 13 15 15  '$ CREATININE 0.66 0.56 0.47 0.47  CALCIUM 7.5* 7.5* 7.3* 7.6*  MG 1.8 1.9 2.0 2.0  PHOS  --   --   --  1.5*    GFR: Estimated Creatinine Clearance: 56.8 mL/min (by C-G formula based on SCr of 0.47 mg/dL). Liver Function Tests: Recent Labs  Lab 09/01/22 0531 09/02/22 0528  AST 15 23  ALT 35 38  ALKPHOS 97 112  BILITOT 0.5 0.3  PROT 5.6* 5.2*  ALBUMIN 2.8* 2.8*   No results for input(s): "LIPASE", "AMYLASE" in the last 168 hours. No results for input(s): "AMMONIA" in the last 168 hours. Coagulation Profile: No results for input(s): "INR", "PROTIME" in the last 168 hours. Cardiac Enzymes: No results for input(s): "CKTOTAL", "CKMB", "CKMBINDEX", "TROPONINI" in the last 168 hours. BNP (last 3 results) No results for input(s): "PROBNP" in the last 8760 hours. HbA1C: No results for input(s): "HGBA1C" in the last 72 hours. CBG: No results for input(s): "GLUCAP" in the last 168 hours. Lipid Profile: Recent Labs    09/02/22 0528  CHOL 124  HDL 46  LDLCALC 49  TRIG 146  CHOLHDL 2.7   Thyroid Function Tests: No results for input(s): "TSH", "T4TOTAL", "FREET4", "T3FREE", "THYROIDAB" in the last 72 hours. Anemia Panel: Recent Labs    09/02/22 0528  VITAMINB12 957*  FOLATE 8.9  FERRITIN 211  TIBC 209*  IRON 80  RETICCTPCT 2.3   Sepsis Labs: No results for input(s): "PROCALCITON", "LATICACIDVEN" in the last 168 hours.  No results found for this or any previous visit (from the past 240 hour(s)).        Radiology Studies: No results found.      Scheduled Meds:  aspirin EC  81 mg Oral Daily   Chlorhexidine Gluconate Cloth  6 each Topical Daily   clopidogrel  75 mg Oral Daily   dabrafenib mesylate  150 mg Oral Q12H   dexamethasone  2 mg Oral Daily   diclofenac Sodium  4 g Topical QID   dicyclomine  10 mg Oral Once   enoxaparin (LOVENOX) injection  40 mg Subcutaneous Q24H   feeding supplement  1 Container Oral TID BM   fentaNYL  1 patch Transdermal [id number]   folic acid  1 mg Oral Daily   LORazepam  2 mg Sublingual TID AC & HS   megestrol  400 mg Oral BID    OLANZapine zydis  10 mg Oral Daily   OLANZapine zydis  5 mg Oral QHS   ondansetron  8 mg Oral Q12H   pantoprazole (  PROTONIX) IV  40 mg Intravenous Q12H   senna  1 tablet Oral QHS   sertraline  100 mg Oral BID   trametinib dimethyl sulfoxide  2 mg Oral Daily   Continuous Infusions:  sodium chloride 75 mL/hr at 09/02/22 0536     LOS: 11 days    Time spent:40 min    Adreonna Yontz, Geraldo Docker, MD [facility]   If 7PM-7AM, please contact night-coverage 09/02/2022, 9:39 PM

## 2022-09-02 NOTE — Progress Notes (Signed)
Palliative Medicine Progress Note   Patient Name: Karen [name]       Date: 09/02/2022 DOB: [birth year]  Age: 57 y.o. MRN#: [id number] Attending Physician: Allie Bossier, MD Primary Care Physician: Starlyn Skeans, MD Admit Date: 08/21/2022  Reason for Consultation/Follow-up: Symptom management-  Pt with adenocarcinoma of lung, just had first round of induction chemo, and did not go very well. Now in with SOB + worsening N/V. Has worsening cancer on CT and malignant effusion.     Patient Profile/HPI: 56 y.o. 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 CT scan result indicated effusion - s/p thoracentesis with 417m off. Pathology revealed malignant pleural effusion. Scan also indicated progression of cancer since last scan 07/05/22 and concerns for lymphangitic carcinomatosis. Palliative medicine consulted for symptom management.     3/3- worsening post procedure hydropneumothorax- chest tube placed 3/9- chest tube out 3/12- continues to have nausea and vomiting, started oral immunotherapy   Subjective: Chart reviewed including labs, progress notes, and imaging.  Bedside visit using video interpreter (Jonita Albee#[phone]. OAlicereports nausea is improved.  She reports she has been able to tolerate food/drink.  Per I&O flowsheet, she ate 50% of lunch.   She denies pain but does endorse shortness of breath. OMicellereports that she has not slept well the past 2  nights and ask why her trazodone was discontinued.  She reports she has been on this for many years.    Objective:  Physical Exam Vitals reviewed.  Constitutional:      General: She is not in acute distress. Pulmonary:     Effort: Pulmonary effort is normal.  Neurological:     Mental Status: She is alert and oriented to person, place, and time.             Vital Signs: BP (!) 143/83 (BP Location: Right Arm)   Pulse 95   Temp 98.5 F (36.9 C) (Oral)   Resp 20   Ht '5\' 2"'$  (1.575 m)   Wt 45.8 kg   LMP 02/08/2006  SpO2 93%   BMI 18.47 kg/m  SpO2: SpO2: 93 % O2 Device: O2 Device: Room Air   LBM: Last BM Date : 08/28/22      Palliative Medicine Assessment & Plan   Assessment: Principal Problem:   Pleural effusion on right Active Problems:   Major depression, recurrent, chronic (HCC)   Intractable nausea and vomiting   Primary adenocarcinoma of lung (HCC)   History of ischemic stroke   Cancer associated pain   Goals of care, counseling/discussion   Malignant pleural effusion    Recommendations/Plan:  Refractory/chronic CINV/chronic nausea exacerbated by anxiety: Change ondansetron 8 mg from IV to ODT 2 times daily Continue Compazine 10 mg IV every 4 hours as needed for refractory nausea/vomiting Continue lorazepam 2 mg sublingual 3 times daily with meals and nightly for anticipatory nausea before eating Continue Zyprexa ODT 10 mg every morning and 5 mg nightly Continue Protonix 40 mg IV twice daily -consider changing to p.o. if nausea continues to be controlled Restart trazodone 150 mg at bedtime as needed for sleep - patient reports that she has been on this for many years Pain: Increase fentanyl patch to 25 mcg every 3 days Continue hydrocodone on-acetaminophen 5-325 mg 2 tablets every 6 hours as needed for pain (she has not needed this)   Code Status: Full   Prognosis:  Unable to determine  Discharge Planning: To Be Determined, PT/OT recommending  home health    Thank you for allowing the Palliative Medicine Team to assist in the care of this patient.   MDM - High   Lavena Bullion, NP   Please contact Palliative Medicine Team phone at [phone] for questions and concerns.  For individual providers, please see AMION.

## 2022-09-02 NOTE — Plan of Care (Signed)
  Problem: Activity: Goal: Risk for activity intolerance will decrease Outcome: Progressing   Problem: Coping: Goal: Level of anxiety will decrease Outcome: Progressing   Problem: Nutrition: Goal: Adequate nutrition will be maintained Outcome: Progressing   

## 2022-09-02 NOTE — Progress Notes (Signed)
Nutrition Follow-up  DOCUMENTATION CODES:   Underweight  INTERVENTION:   -Needs updated weight   -Boost Breeze po TID, each supplement provides 250 kcal and 9 grams of protein   NUTRITION DIAGNOSIS:   Increased nutrient needs related to cancer and cancer related treatments as evidenced by estimated needs.  Ongoing.  GOAL:   Patient will meet greater than or equal to 90% of their needs  Needs supplements.  MONITOR:   PO intake, Supplement acceptance, Labs, Weight trends, I & O's  ASSESSMENT:   58 y.o. 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 CT scan result indicated effusion concerning for malignant effusion- s/p thoracentesis with 488m off, pathology pending. Scan also indicated progression of cancer since last scan 07/05/22 and concerns for lymphangitic carcinomatosis.  Will attempt to speak with patient at later time when interpreter available. Per chart review, patient's nausea has improved. Still requiring medication. Palliative care following for symptom management. Started immunotherapy 3/12.  Consuming 25-50% of meals currently. Not drinking Boost Breeze.   Admission weight: 102 lbs Last weight: 100 lbs  Medications: Folic acid, KLOR-CON, Senokot, Emend,  Zofran, Na Phos  Labs reviewed: Low Na Low Phos   Diet Order:   Diet Order             Diet regular Room service appropriate? Yes; Fluid consistency: Thin  Diet effective now                   EDUCATION NEEDS:   Not appropriate for education at this time  Skin:  Skin Assessment: Reviewed RN Assessment  Last BM:  3/9  Height:   Ht Readings from Last 1 Encounters:   08/21/22 '5\' 2"'$  (1.575 m)    Weight:   Wt Readings from Last 1 Encounters:  08/22/22 45.8 kg    BMI:  Body mass index is 18.47 kg/m.  Estimated Nutritional Needs:   Kcal:  1650-1850  Protein:  75-95g  Fluid:  >1.9L/day   LClayton Bibles MS, RD, LDN Inpatient Clinical Dietitian Contact information available via Amion

## 2022-09-02 NOTE — Progress Notes (Signed)
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  Result Date: 08/21/2022 CLINICAL DATA:  Concern for pulmonary embolism. History hands cold post noted it cold EXAM: CT ANGIOGRAPHY CHEST WITH CONTRAST TECHNIQUE: Multidetector CT imaging of the chest was performed using the standard protocol during bolus administration of intravenous contrast. Multiplanar CT image reconstructions and MIPs were obtained to evaluate the vascular anatomy. RADIATION DOSE REDUCTION: This exam was performed according to the departmental dose-optimization program which includes automated exposure control, adjustment of the mA and/or kV according to patient size and/or use of iterative reconstruction technique. CONTRAST:  75mL OMNIPAQUE IOHEXOL 350 MG/ML SOLN COMPARISON:  Chest CT dated 06/25/2022 and radiograph dated 08/21/2022. FINDINGS: Evaluation of this exam is limited due to respiratory motion artifact. Cardiovascular: There is no cardiomegaly small pericardial effusion, new since the prior CT and measuring 1 cm in thickness. The thoracic aorta is unremarkable. The origins of the great vessels of the aortic arch appear patent. Evaluation of the pulmonary arteries is limited due to respiratory motion. No central pulmonary artery embolus identified.  Mediastinum/Nodes: There is infiltrative soft tissue the mediastinum and bilateral hila in keeping with known malignancy. No mediastinal fluid collection. Right-sided Port-A-Cath with tip at the cavoatrial junction. Lungs/Pleura: There is a moderate size right and trace left pleural effusions, new since the prior CT and suspicious for malignant effusion. There is partial compressive atelectasis of the right lower lobe. Patchy area of consolidation in the right perihilar  region extending into the right upper lobe and right middle lobes may represent combination of tumor infiltration, atelectasis, or pneumonia. There is however diffuse interstitial thickening and nodularity primarily involving the upper lobes bilaterally as well as in the lower lobes suspicious for lymphangitic carcinomatosis. There is no pneumothorax. There is narrowing of the right upper lobe bronchus as well as narrowing of the right middle and right lower lobe bronchi. The central airways remain patent. Upper Abdomen: Small calcified splenic granuloma. Musculoskeletal: Sclerotic lesions involving T10, T12, and L3 consistent with metastatic disease. No acute osseous pathology. Review of the MIP images confirms the above findings. IMPRESSION: 1. No CT evidence of central pulmonary artery embolus. 2. Moderate size right and trace left pleural effusions, new since the prior CT and suspicious for malignant effusion. 3. Infiltrative mediastinal and hilar mass in keeping with known malignancy. 4. Significant progression of disease with bilateral perihilar consolidation and findings 5. suspicious for lymphangitic carcinomatosis. 6. Sclerotic lesions involving T10, T12, and L3 consistent with metastatic disease. Electronically Signed   By: Elgie Collard M.D.   On: 08/21/2022 20:53   DG Chest Port 1 View  Result Date: 08/21/2022 CLINICAL DATA:  Shortness of breath EXAM: PORTABLE CHEST 1 VIEW COMPARISON:  PET-CT dated July 29, 2022 and CT  examination dated August 13, 2022 FINDINGS: The heart is normal in size. Pulmonary vascular congestion with bilateral perihilar opacities consistent with patient's known hilar lymphadenopathy. Small bilateral pleural effusions. Right IJ access MediPort with distal tip in the SVC no acute osseous abnormality IMPRESSION: 1. Pulmonary vascular congestion with bilateral perihilar opacities consistent with patient's known hilar lymphadenopathy. 2. Small bilateral pleural effusions. Electronically Signed   By: Larose Hires D.O.   On: 08/21/2022 19:59   VAS [place] LOWER EXTREMITY VENOUS (DVT)  Result Date: 08/17/2022  Lower Venous DVT Study Patient Name:  MODELL FENDRICK  Date of Exam:   08/17/2022 Medical Rec #: [id number]               Accession #:    [id number] Date of Birth: Jul 15, 1965                Patient Gender: F Patient Age:   82 years Exam Location:  [facility] Procedure:      VAS [place] LOWER EXTREMITY VENOUS (DVT) Referring Phys: Mercie Eon --------------------------------------------------------------------------------  Other Indications: Stroke, primary lung adenocarcinoma. Comparison Study: No prior study. Performing Technologist: McKayla Maag RVT, [place]  Examination Guidelines: A complete evaluation includes B-mode imaging, spectral Doppler, color Doppler, and power Doppler as needed of all accessible portions of each vessel. Bilateral testing is considered an integral part of a complete examination. Limited examinations for reoccurring indications may be performed as noted. The reflux portion of the exam is performed with the patient in reverse Trendelenburg.  +---------+---------------+---------+-----------+----------+--------------+ RIGHT    CompressibilityPhasicitySpontaneityPropertiesThrombus Aging +---------+---------------+---------+-----------+----------+--------------+ CFV      Full           Yes      Yes                                  +---------+---------------+---------+-----------+----------+--------------+ SFJ      Full                                                        +---------+---------------+---------+-----------+----------+--------------+  FV Prox  Full                                                        +---------+---------------+---------+-----------+----------+--------------+ FV Mid   Full                                                        +---------+---------------+---------+-----------+----------+--------------+ FV DistalFull                                                        +---------+---------------+---------+-----------+----------+--------------+ PFV      Full                                                        +---------+---------------+---------+-----------+----------+--------------+ POP      Full           Yes      Yes                                 +---------+---------------+---------+-----------+----------+--------------+ PTV      Full                                                        +---------+---------------+---------+-----------+----------+--------------+ PERO     Full                                                        +---------+---------------+---------+-----------+----------+--------------+   +---------+---------------+---------+-----------+----------+--------------+ LEFT     CompressibilityPhasicitySpontaneityPropertiesThrombus Aging +---------+---------------+---------+-----------+----------+--------------+ CFV      Full           Yes      Yes                                 +---------+---------------+---------+-----------+----------+--------------+ SFJ      Full                                                        +---------+---------------+---------+-----------+----------+--------------+ FV Prox  Full                                                         +---------+---------------+---------+-----------+----------+--------------+  FV Mid   Full                                                        +---------+---------------+---------+-----------+----------+--------------+ FV DistalFull                                                        +---------+---------------+---------+-----------+----------+--------------+ PFV      Full                                                        +---------+---------------+---------+-----------+----------+--------------+ POP      Full           Yes      Yes                                 +---------+---------------+---------+-----------+----------+--------------+ PTV      Full                                                        +---------+---------------+---------+-----------+----------+--------------+ PERO     Full                                                        +---------+---------------+---------+-----------+----------+--------------+     Summary: BILATERAL: - No evidence of deep vein thrombosis seen in the lower extremities, bilaterally. - No evidence of superficial venous thrombosis in the lower extremities, bilaterally. -No evidence of popliteal cyst, bilaterally.   *See table(s) above for measurements and observations. Electronically signed by Waverly Ferrari MD on 08/17/2022 at 7:34:49 PM.    Final    IR IMAGING GUIDED PORT INSERTION  Result Date: 08/17/2022 INDICATION: Chemotherapy access EXAM: Chest port placement using ultrasound and fluoroscopic guidance MEDICATIONS: Documented in the EMR ANESTHESIA/SEDATION: Moderate (conscious) sedation was employed during this procedure. A total of Versed 2 mg and Fentanyl 100 mcg was administered intravenously. Moderate Sedation Time: 32 minutes. The patient's level of consciousness and vital signs were monitored continuously by radiology nursing throughout the procedure under my direct supervision. FLUOROSCOPY TIME:   Fluoroscopy Time: 0.1 minutes (1 mGy) COMPLICATIONS: None immediate. PROCEDURE: Informed written consent was obtained from the patient after a thorough discussion of the procedural risks, benefits and alternatives. All questions were addressed. Maximal Sterile Barrier Technique was utilized including caps, mask, sterile gowns, sterile gloves, sterile drape, hand hygiene and skin antiseptic. A timeout was performed prior to the initiation of the procedure. The patient was placed supine on the exam table. The right neck and chest was prepped and draped in the standard sterile fashion. A preliminary ultrasound of the right neck  was performed and demonstrates a patent right internal jugular vein. A permanent ultrasound image was stored in the electronic medical record. The overlying skin was anesthetized with 1% Lidocaine. Using ultrasound guidance, access was obtained into the right internal jugular vein using a 21 gauge micropuncture set. A wire was advanced into the SVC, a short incision was made at the puncture site, and serial dilatation performed. Next, in an ipsilateral infraclavicular location, an incision was made at the site of the subcutaneous reservoir. Blunt dissection was used to open a pocket to contain the reservoir. A subcutaneous tunnel was then created from the port site to the puncture site. A(n) 8 Fr single lumen catheter was advanced through the tunnel. The catheter was attached to the port and this was placed in the subcutaneous pocket. Under fluoroscopic guidance, a peel away sheath was placed, and the catheter was trimmed to the appropriate length and was advanced into the central veins. The catheter length is 23 cm. The tip of the catheter lies near the superior cavoatrial junction. The port flushes and aspirates appropriately. The port was flushed and locked with heparinized saline. The port pocket was closed in 2 layers using 3-0 and 4-0 Vicryl/absorbable suture. Dermabond was also applied to  both incisions. The patient tolerated the procedure well and was transferred to recovery in stable condition. IMPRESSION: Successful placement of a right-sided chest port via the right internal jugular vein. The port is ready for immediate use. Electronically Signed   By: Olive Bass M.D.   On: 08/17/2022 16:49   ECHO TEE  Result Date: 08/17/2022    TRANSESOPHOGEAL ECHO REPORT   Patient Name:   CARNISHA FELTZ Date of Exam: 08/17/2022 Medical Rec #:  [id number]              Height:       62.0 in Accession #:    [id number]             Weight:       98.1 lb Date of Birth:  February 15, 1966               BSA:          1.412 m Patient Age:    56 years               BP:           119/82 mmHg Patient Gender: F                      HR:           88 bpm. Exam Location:  Inpatient Procedure: Transesophageal Echo, Color Doppler, Cardiac Doppler and Saline            Contrast Bubble Study Indications:     Stroke  History:         Patient has prior history of Echocardiogram examinations, most                  recent 08/14/2022. Risk Factors:Hypertension. Latent TB,                  metastatic lung adenocarcinoma.  Sonographer:     Milda Smart Referring Phys:  [id number] Cyndi Bender Diagnosing Phys: Epifanio Lesches MD PROCEDURE: After discussion of the risks and benefits of a TEE, an informed consent was obtained from the patient. The transesophogeal probe was passed without difficulty through the esophogus of the patient. Imaged were obtained with the patient in a  left lateral decubitus position. Sedation performed by different physician. The patient was monitored while under deep sedation. Image quality was good. The patient's vital signs; including heart rate, blood pressure, and oxygen saturation; remained stable throughout the procedure. The patient developed no complications during the procedure.  IMPRESSIONS  1. Left ventricular ejection fraction, by estimation, is 60 to 65%. The left ventricle has normal  function.  2. Right ventricular systolic function is normal. The right ventricular size is normal.  3. No left atrial/left atrial appendage thrombus was detected.  4. Moderate pericardial effusion. Moderate pleural effusion.  5. The mitral valve is normal in structure. Trivial mitral valve regurgitation.  6. Tricuspid valve regurgitation is mild to moderate.  7. The aortic valve is tricuspid. Aortic valve regurgitation is not visualized. No aortic stenosis is present.  8. Evidence of atrial level shunting detected by color flow Doppler. Agitated saline contrast bubble study was positive with shunting observed after >6 cardiac cycles suggestive of intrapulmonary shunting. Conclusion(s)/Recommendation(s): No intracardiac source of embolism detected on this on this transesophageal echocardiogram. FINDINGS  Left Ventricle: Left ventricular ejection fraction, by estimation, is 60 to 65%. The left ventricle has normal function. The left ventricular internal cavity size was normal in size. Right Ventricle: The right ventricular size is normal. No increase in right ventricular wall thickness. Right ventricular systolic function is normal. Left Atrium: Left atrial size was normal in size. No left atrial/left atrial appendage thrombus was detected. Right Atrium: Right atrial size was normal in size. Pericardium: A moderately sized pericardial effusion is present. Mitral Valve: The mitral valve is normal in structure. Trivial mitral valve regurgitation. Tricuspid Valve: The tricuspid valve is normal in structure. Tricuspid valve regurgitation is mild to moderate. Aortic Valve: The aortic valve is tricuspid. Aortic valve regurgitation is not visualized. No aortic stenosis is present. Pulmonic Valve: The pulmonic valve was not well visualized. Pulmonic valve regurgitation is trivial. Aorta: The aortic root and ascending aorta are structurally normal, with no evidence of dilitation. IAS/Shunts: Evidence of atrial level shunting  detected by color flow Doppler. Agitated saline contrast was given intravenously to evaluate for intracardiac shunting. Agitated saline contrast bubble study was positive with shunting observed after >6 cardiac cycles suggestive of intrapulmonary shunting. Additional Comments: There is a moderate pleural effusion. Spectral Doppler performed. TRICUSPID VALVE TR Peak grad:   31.6 mmHg TR Mean grad:   21.0 mmHg TR Vmax:        281.00 cm/s TR Vmean:       218.0 cm/s Epifanio Lesches MD Electronically signed by Epifanio Lesches MD Signature Date/Time: 08/17/2022/1:59:41 PM    Final (Updated)    ECHOCARDIOGRAM COMPLETE  Result Date: 08/14/2022    ECHOCARDIOGRAM REPORT   Patient Name:   MISHAL PROBERT Date of Exam: 08/14/2022 Medical Rec #:  [id number]              Height:       62.0 in Accession #:    [id number]             Weight:       98.0 lb Date of Birth:  May 29, 1966               BSA:          1.412 m Patient Age:    56 years               BP:           134/68 mmHg Patient Gender: F  HR:           74 bpm. Exam Location:  Inpatient Procedure: 2D Echo, Cardiac Doppler and Color Doppler Indications:    I63.9 Stroke  History:        Patient has prior history of Echocardiogram examinations, most                 recent 05/29/2021. Stroke; Risk Factors:Non-Smoker and                 Hypertension.  Sonographer:    Wilkie Aye RVT RCS Referring Phys: [id number] EMILY B MULLEN  Sonographer Comments: Some images are off axis IMPRESSIONS  1. Left ventricular ejection fraction, by estimation, is 65 to 70%. The left ventricle has normal function. The left ventricle has no regional wall motion abnormalities. Left ventricular diastolic parameters are consistent with Grade I diastolic dysfunction (impaired relaxation).  2. Right ventricular systolic function is normal. The right ventricular size is normal.  3. Moderate pericardial effusion. The pericardial effusion is circumferential. There is no  evidence of cardiac tamponade.  4. The mitral valve is normal in structure. Trivial mitral valve regurgitation.  5. The aortic valve is tricuspid. Aortic valve regurgitation is not visualized. No aortic stenosis is present.  6. The inferior vena cava is normal in size with greater than 50% respiratory variability, suggesting right atrial pressure of 3 mmHg. Comparison(s): Compared to prior TTE on 05/2021, a moderate sized pericardial effusion is now present. Conclusion(s)/Recommendation(s): No intracardiac source of embolism detected on this transthoracic study. Consider a transesophageal echocardiogram to exclude cardiac source of embolism if clinically indicated. FINDINGS  Left Ventricle: Left ventricular ejection fraction, by estimation, is 65 to 70%. The left ventricle has normal function. The left ventricle has no regional wall motion abnormalities. The left ventricular internal cavity size was normal in size. There is  no left ventricular hypertrophy. Left ventricular diastolic parameters are consistent with Grade I diastolic dysfunction (impaired relaxation). Right Ventricle: The right ventricular size is normal. No increase in right ventricular wall thickness. Right ventricular systolic function is normal. Left Atrium: Left atrial size was normal in size. Right Atrium: Right atrial size was normal in size. Pericardium: A moderately sized pericardial effusion is present. The pericardial effusion is circumferential. There is no evidence of cardiac tamponade. Mitral Valve: The mitral valve is normal in structure. Trivial mitral valve regurgitation. Tricuspid Valve: The tricuspid valve is normal in structure. Tricuspid valve regurgitation is mild. Aortic Valve: The aortic valve is tricuspid. Aortic valve regurgitation is not visualized. No aortic stenosis is present. Aortic valve mean gradient measures 3.0 mmHg. Aortic valve peak gradient measures 6.2 mmHg. Aortic valve area, by VTI measures 2.21 cm. Pulmonic  Valve: The pulmonic valve was normal in structure. Pulmonic valve regurgitation is trivial. Aorta: The aortic root is normal in size and structure. Venous: The inferior vena cava is normal in size with greater than 50% respiratory variability, suggesting right atrial pressure of 3 mmHg. IAS/Shunts: The atrial septum is grossly normal.  LEFT VENTRICLE PLAX 2D LVIDd:         3.90 cm   Diastology LVIDs:         2.40 cm   LV e' medial:    5.33 cm/s LV PW:         1.00 cm   LV E/e' medial:  11.0 LV IVS:        0.90 cm   LV e' lateral:   9.46 cm/s LVOT diam:  1.80 cm   LV E/e' lateral: 6.2 LV SV:         53 LV SV Index:   38 LVOT Area:     2.54 cm  RIGHT VENTRICLE             IVC RV S prime:     15.30 cm/s  IVC diam: 1.10 cm TAPSE (M-mode): 2.1 cm LEFT ATRIUM             Index        RIGHT ATRIUM          Index LA diam:        2.40 cm 1.70 cm/m   RA Area:     9.25 cm LA Vol (A2C):   21.7 ml 15.37 ml/m  RA Volume:   18.95 ml 13.42 ml/m LA Vol (A4C):   31.5 ml 22.31 ml/m LA Biplane Vol: 28.3 ml 20.05 ml/m  AORTIC VALVE                    PULMONIC VALVE AV Area (Vmax):    2.09 cm     PV Vmax:          0.77 m/s AV Area (Vmean):   2.02 cm     PV Peak grad:     2.4 mmHg AV Area (VTI):     2.21 cm     PR End Diast Vel: 6.86 msec AV Vmax:           124.00 cm/s AV Vmean:          83.300 cm/s AV VTI:            0.241 m AV Peak Grad:      6.2 mmHg AV Mean Grad:      3.0 mmHg LVOT Vmax:         102.00 cm/s LVOT Vmean:        66.000 cm/s LVOT VTI:          0.209 m LVOT/AV VTI ratio: 0.87  AORTA Ao Root diam: 2.60 cm Ao Asc diam:  2.60 cm MITRAL VALVE               TRICUSPID VALVE MV Area (PHT): 2.99 cm    TR Peak grad:   17.5 mmHg MV Decel Time: 254 msec    TR Vmax:        209.00 cm/s MV E velocity: 58.60 cm/s MV A velocity: 89.70 cm/s  SHUNTS MV E/A ratio:  0.65        Systemic VTI:  0.21 m                            Systemic Diam: 1.80 cm Laurance Flatten MD Electronically signed by Laurance Flatten MD Signature  Date/Time: 08/14/2022/2:29:20 PM    Final    CT ANGIO HEAD NECK W WO CM  Result Date: 08/13/2022 CLINICAL DATA:  Stroke follow-up EXAM: CT ANGIOGRAPHY HEAD AND NECK TECHNIQUE: Multidetector CT imaging of the head and neck was performed using the standard protocol during bolus administration of intravenous contrast. Multiplanar CT image reconstructions and MIPs were obtained to evaluate the vascular anatomy. Carotid stenosis measurements (when applicable) are obtained utilizing NASCET criteria, using the distal internal carotid diameter as the denominator. RADIATION DOSE REDUCTION: This exam was performed according to the departmental dose-optimization program which includes automated exposure control, adjustment of the mA and/or kV according to patient size and/or use of  iterative reconstruction technique. CONTRAST:  75mL OMNIPAQUE IOHEXOL 350 MG/ML SOLN COMPARISON:  07/01/2022 FINDINGS: CT HEAD FINDINGS Brain: There is no mass, hemorrhage or extra-axial collection. The size and configuration of the ventricles and extra-axial CSF spaces are normal. There is no acute or chronic infarction. The brain parenchyma is normal. Skull: The visualized skull base, calvarium and extracranial soft tissues are normal. Sinuses/Orbits: No fluid levels or advanced mucosal thickening of the visualized paranasal sinuses. No mastoid or middle ear effusion. The orbits are normal. CTA NECK FINDINGS SKELETON: There is no bony spinal canal stenosis. No lytic or blastic lesion. OTHER NECK: Normal pharynx, larynx and major salivary glands. No cervical lymphadenopathy. Unremarkable thyroid gland. UPPER CHEST: Masslike opacity at the right hilum measuring 3.4 x 2.1 cm. There is peribronchial thickening in the right lung apex. Small right pleural effusion. AORTIC ARCH: There is no calcific atherosclerosis of the aortic arch. There is no aneurysm, dissection or hemodynamically significant stenosis of the visualized portion of the aorta.  Conventional 3 vessel aortic branching pattern. The visualized proximal subclavian arteries are widely patent. RIGHT CAROTID SYSTEM: Normal without aneurysm, dissection or stenosis. LEFT CAROTID SYSTEM: Normal without aneurysm, dissection or stenosis. VERTEBRAL ARTERIES: Left dominant configuration. Both origins are clearly patent. There is no dissection, occlusion or flow-limiting stenosis to the skull base (V1-V3 segments). CTA HEAD FINDINGS POSTERIOR CIRCULATION: --Vertebral arteries: Normal V4 segments. --Inferior cerebellar arteries: Normal. --Basilar artery: Normal. --Superior cerebellar arteries: Normal. --Posterior cerebral arteries (PCA): Normal. ANTERIOR CIRCULATION: --Intracranial internal carotid arteries: Normal. --Anterior cerebral arteries (ACA): Normal. Both A1 segments are present. Patent anterior communicating artery (a-comm). --Middle cerebral arteries (MCA): Normal. VENOUS SINUSES: As permitted by contrast timing, patent. ANATOMIC VARIANTS: None Review of the MIP images confirms the above findings. IMPRESSION: 1. No emergent large vessel occlusion or high-grade stenosis of the intracranial or cervical arteries. 2. Masslike opacity at the right hilum measuring 3.4 x 2.1 cm, likely confluent hilar malignant adenopathy. 3. Small right pleural effusion. Electronically Signed   By: Deatra Robinson M.D.   On: 08/13/2022 23:51   MR Brain Wo Contrast (neuro protocol)  Result Date: 08/13/2022 CLINICAL DATA:  TIA EXAM: MRI HEAD WITHOUT CONTRAST TECHNIQUE: Multiplanar, multiecho pulse sequences of the brain and surrounding structures were obtained without intravenous contrast. COMPARISON:  MRI Brain 07/31/22 FINDINGS: Brain: Small punctate focus of acute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

## 2022-09-03 ENCOUNTER — Other Ambulatory Visit (HOSPITAL_COMMUNITY): Payer: Self-pay

## 2022-09-03 ENCOUNTER — Inpatient Hospital Stay (HOSPITAL_COMMUNITY): Payer: Commercial Managed Care - HMO

## 2022-09-03 DIAGNOSIS — R112 Nausea with vomiting, unspecified: Secondary | ICD-10-CM | POA: Diagnosis not present

## 2022-09-03 DIAGNOSIS — G893 Neoplasm related pain (acute) (chronic): Secondary | ICD-10-CM | POA: Diagnosis not present

## 2022-09-03 DIAGNOSIS — J90 Pleural effusion, not elsewhere classified: Secondary | ICD-10-CM | POA: Diagnosis not present

## 2022-09-03 DIAGNOSIS — R630 Anorexia: Secondary | ICD-10-CM | POA: Diagnosis not present

## 2022-09-03 DIAGNOSIS — E43 Unspecified severe protein-calorie malnutrition: Secondary | ICD-10-CM | POA: Diagnosis not present

## 2022-09-03 DIAGNOSIS — C3490 Malignant neoplasm of unspecified part of unspecified bronchus or lung: Secondary | ICD-10-CM | POA: Diagnosis not present

## 2022-09-03 DIAGNOSIS — F419 Anxiety disorder, unspecified: Secondary | ICD-10-CM

## 2022-09-03 LAB — COMPREHENSIVE METABOLIC PANEL
ALT: 33 U/L (ref 0–44)
AST: 21 U/L (ref 15–41)
Albumin: 2.6 g/dL — ABNORMAL LOW (ref 3.5–5.0)
Alkaline Phosphatase: 145 U/L — ABNORMAL HIGH (ref 38–126)
Anion gap: 5 (ref 5–15)
BUN: 11 mg/dL (ref 6–20)
CO2: 21 mmol/L — ABNORMAL LOW (ref 22–32)
Calcium: 6.9 mg/dL — ABNORMAL LOW (ref 8.9–10.3)
Chloride: 106 mmol/L (ref 98–111)
Creatinine, Ser: 0.49 mg/dL (ref 0.44–1.00)
GFR, Estimated: >60 mL/min (ref >60)
Glucose, Bld: 95 mg/dL (ref 70–99)
Potassium: 2.9 mmol/L — ABNORMAL LOW (ref 3.5–5.1)
Sodium: 132 mmol/L — ABNORMAL LOW (ref 135–145)
Total Bilirubin: 0.4 mg/dL (ref 0.3–1.2)
Total Protein: 4.8 g/dL — ABNORMAL LOW (ref 6.5–8.1)

## 2022-09-03 LAB — BASIC METABOLIC PANEL
Anion gap: 6 (ref 5–15)
BUN: 10 mg/dL (ref 6–20)
CO2: 19 mmol/L — ABNORMAL LOW (ref 22–32)
Calcium: 7.4 mg/dL — ABNORMAL LOW (ref 8.9–10.3)
Chloride: 108 mmol/L (ref 98–111)
Creatinine, Ser: 0.49 mg/dL (ref 0.44–1.00)
GFR, Estimated: >60 mL/min (ref >60)
Glucose, Bld: 137 mg/dL — ABNORMAL HIGH (ref 70–99)
Potassium: 3.4 mmol/L — ABNORMAL LOW (ref 3.5–5.1)
Sodium: 133 mmol/L — ABNORMAL LOW (ref 135–145)

## 2022-09-03 LAB — CBC WITH DIFFERENTIAL/PLATELET
Abs Immature Granulocytes: 0.01 10*3/uL (ref 0.00–0.07)
Basophils Absolute: 0 10*3/uL (ref 0.0–0.1)
Basophils Relative: 0 %
Eosinophils Absolute: 0 10*3/uL (ref 0.0–0.5)
Eosinophils Relative: 0 %
HCT: 27.6 % — ABNORMAL LOW (ref 36.0–46.0)
Hemoglobin: 9.4 g/dL — ABNORMAL LOW (ref 12.0–15.0)
Immature Granulocytes: 0 %
Lymphocytes Relative: 36 %
Lymphs Abs: 2.1 10*3/uL (ref 0.7–4.0)
MCH: 31.2 pg (ref 26.0–34.0)
MCHC: 34.1 g/dL (ref 30.0–36.0)
MCV: 91.7 fL (ref 80.0–100.0)
Monocytes Absolute: 0.3 10*3/uL (ref 0.1–1.0)
Monocytes Relative: 5 %
Neutro Abs: 3.5 10*3/uL (ref 1.7–7.7)
Neutrophils Relative %: 59 %
Platelets: 345 10*3/uL (ref 150–400)
RBC: 3.01 MIL/uL — ABNORMAL LOW (ref 3.87–5.11)
RDW: 13.2 % (ref 11.5–15.5)
WBC: 5.9 10*3/uL (ref 4.0–10.5)
nRBC: 0 % (ref 0.0–0.2)

## 2022-09-03 LAB — MAGNESIUM
Magnesium: 1.8 mg/dL (ref 1.7–2.4)
Magnesium: 1.8 mg/dL (ref 1.7–2.4)

## 2022-09-03 LAB — PHOSPHORUS
Phosphorus: 2.5 mg/dL (ref 2.5–4.6)
Phosphorus: 1.8 mg/dL — ABNORMAL LOW (ref 2.5–4.6)

## 2022-09-03 MED ORDER — POTASSIUM CHLORIDE 20 MEQ PO PACK
60.0000 meq | PACK | Freq: Once | ORAL | Status: AC
  Administered 2022-09-03: 60 meq via ORAL
  Filled 2022-09-03: qty 3

## 2022-09-03 MED ORDER — SODIUM CHLORIDE 0.9 % IV SOLN
3.0000 g | Freq: Once | INTRAVENOUS | Status: AC
  Administered 2022-09-03: 3 g via INTRAVENOUS
  Filled 2022-09-03: qty 30

## 2022-09-03 MED ORDER — LORAZEPAM 0.5 MG PO TABS: 0.5000 mg | ORAL_TABLET | Freq: Four times a day (QID) | ORAL | Status: DC | PRN
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
## 2022-09-03 NOTE — Progress Notes (Signed)
Physical Therapy Treatment Patient Details Name: Karen [name] MRN: [id number] DOB: November 19, 1965 Today's Date: 09/03/2022   History of Present Illness Pt is 57 yo female admitted with intractable nausea and vomiting secondary to chemotherapy (pt with metastatic non-small cell lung CA) and found to have malignant pleural effusion.  She is s/p thoracentesis with resultant R sided hydropneumothorax. Pigtail catheter placed 08/24/22 and removed 08/28/22. 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    PT Comments    Pt seen for PT tx with pt agreeable. [place] interpreter [phone]). Pt is able to ambulate increased distances on this date, but still holds to IV pole with 1 UE for increased support. Pt demonstrates decreased gait speed throughout mobility but no LOB when holding to IV pole. Pt performed sit<>stands without BUE support for strengthening & endurance training & single leg stance to challenge balance. Pt would benefit from further endurance & balance training & as well as practice negotiating stairs as pt has 3 flights to access her apartment.    Recommendations for follow up therapy are one component of a multi-disciplinary discharge planning process, led by the attending physician.  Recommendations may be updated based on patient status, additional functional criteria and insurance authorization.  Follow Up Recommendations  Home health PT     Assistance Recommended at Discharge Intermittent Supervision/Assistance  Patient can return home with the following A little help with walking and/or transfers;A little help with bathing/dressing/bathroom;Help with stairs or ramp for entrance;Assistance with cooking/housework   Equipment Recommendations  BSC/3in1;Rolling walker (2 wheels)    Recommendations for Other Services       Precautions / Restrictions Precautions Precautions: Fall Restrictions Weight Bearing Restrictions: No     Mobility  Bed  Mobility Overal bed mobility: Modified Independent Bed Mobility: Supine to Sit, Sit to Supine     Supine to sit: Modified independent (Device/Increase time) Sit to supine: Modified independent (Device/Increase time)        Transfers Overall transfer level: Modified independent Equipment used: None Transfers: Sit to/from Stand Sit to Stand: Modified independent (Device/Increase time)                Ambulation/Gait Ambulation/Gait assistance: Supervision Gait Distance (Feet): 150 Feet Assistive device: IV Pole Gait Pattern/deviations: Decreased step length - right, Decreased step length - left, Decreased stride length Gait velocity: decreased     General Gait Details: Pt attempts taking a few steps without holding to IV pole but pt appears uneasy & requires CGA at times. PT then provided IV pole for pt to hold to with 1UE. Pt ambulates with IV pole without LOB & supervision.   Stairs             Wheelchair Mobility    Modified Rankin (Stroke Patients Only)       Balance Overall balance assessment: Needs assistance Sitting-balance support: No upper extremity supported, Feet supported Sitting balance-Leahy Scale: Good     Standing balance support: During functional activity, No upper extremity supported Standing balance-Leahy Scale: Fair                              Cognition Arousal/Alertness: Awake/alert Behavior During Therapy: WFL for tasks assessed/performed Overall Cognitive Status: Within Functional Limits for tasks assessed  Exercises Other Exercises Other Exercises: Pt performed 10x sit<>stand without BUE support for BLE strengthening & endurance training. PT provides education to not lean posteriorly on bed with BLE. Other Exercises: Pt engaged in balance task of single leg stance on each BLE. Pt requires up to min assist & occasional UE support to correct LOB. Max single  leg stance time of ~8 seconds.    General Comments General comments (skin integrity, edema, etc.): max HR 112 bpm      Pertinent Vitals/Pain Pain Assessment Pain Assessment: 0-10 Pain Score:  (3-5/10) Pain Location: L side of chest Pain Descriptors / Indicators:  (pt does not describe) Pain Intervention(s): Monitored during session (notified nurse who reports medical team is aware of this)    Home Living                          Prior Function            PT Goals (current goals can now be found in the care plan section) Acute Rehab PT Goals Patient Stated Goal: none stated PT Goal Formulation: With patient Time For Goal Achievement: 09/13/22 Potential to Achieve Goals: Good Progress towards PT goals: Progressing toward goals    Frequency    Min 3X/week      PT Plan Current plan remains appropriate;Equipment recommendations need to be updated    Co-evaluation              AM-PAC PT "6 Clicks" Mobility   Outcome Measure  Help needed turning from your back to your side while in a flat bed without using bedrails?: None Help needed moving from lying on your back to sitting on the side of a flat bed without using bedrails?: None Help needed moving to and from a bed to a chair (including a wheelchair)?: A Little Help needed standing up from a chair using your arms (e.g., wheelchair or bedside chair)?: None Help needed to walk in hospital room?: A Little Help needed climbing 3-5 steps with a railing? : A Little 6 Click Score: 21    End of Session   Activity Tolerance: Patient tolerated treatment well;Patient limited by fatigue Patient left: in bed;with call bell/phone within reach   PT Visit Diagnosis: Difficulty in walking, not elsewhere classified (R26.2);Unsteadiness on feet (R26.81);Muscle weakness (generalized) (M62.81)     Time: [id number] PT Time Calculation (min) (ACUTE ONLY): 15 min  Charges:  $Therapeutic Activity: 8-22 mins                      Lavone Nian, PT, DPT 09/03/22, 12:08 PM   Waunita Schooner 09/03/2022, 12:07 PM

## 2022-09-03 NOTE — Progress Notes (Signed)
Palliative Medicine Progress Note   Patient Name: Karen [name]       Date: 09/03/2022 DOB: 19-May-1966  Age: 57 y.o. MRN#: [id number] Attending Physician: Allie Bossier, MD Primary Care Physician: Starlyn Skeans, MD Admit Date: 08/21/2022  Reason for Consultation/Follow-up: {Reason for Consult:23484}  HPI/Patient Profile: 57 y.o. 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 CT scan result indicated effusion - s/p thoracentesis with 464mL off. Pathology revealed malignant pleural effusion. Scan also indicated progression of cancer since last scan 07/05/22 and concerns for lymphangitic carcinomatosis. Palliative medicine consulted for symptom management.     3/3- worsening post procedure hydropneumothorax- chest tube placed 3/9- chest tube out 3/12- continues to have nausea and vomiting, started oral immunotherapy  Subjective: ***  Objective:  Physical Exam          Vital Signs: BP 130/88 (BP Location: Right Arm)   Pulse 95   Temp 98 F (36.7 C) (Oral)   Resp 20   Ht 5\' 2"  (1.575 m)   Wt 45.8 kg   LMP 02/08/2006   SpO2 95%   BMI 18.47 kg/m  SpO2: SpO2: 95 % O2 Device: O2 Device: Room Air O2 Flow Rate: O2 Flow Rate (L/min): 2 L/min  Intake/output summary:  Intake/Output Summary (Last 24 hours) at 09/03/2022 1725 Last data filed at 09/03/2022 1512 Gross per 24 hour  Intake 434 ml  Output --  Net 434 ml    LBM: Last BM Date :  09/02/22     Palliative Assessment/Data: ***     Palliative Medicine Assessment & Plan   Assessment: Principal Problem:   Pleural effusion on right Active Problems:   Major depression, recurrent, chronic (HCC)   Intractable nausea and vomiting   Primary adenocarcinoma of lung (HCC)   History of ischemic stroke   Cancer associated pain   Goals of care, counseling/discussion   Malignant pleural effusion   Protein-calorie malnutrition, severe (HCC)    Recommendations/Plan: ***  Goals of Care and Additional Recommendations: Limitations on Scope of Treatment: {Recommended Scope  and Preferences:21019}  Code Status:   Prognosis:  {Palliative Care Prognosis:23504}  Discharge Planning: {Palliative dispostion:23505}  Care plan was discussed with ***  Thank you for allowing the Palliative Medicine Team to assist in the care of this patient.   ***   Lavena Bullion, NP   Please contact Palliative Medicine Team phone at [phone] for questions and concerns.  For individual providers, please see AMION.

## 2022-09-03 NOTE — Progress Notes (Signed)
PROGRESS NOTE    Karen [name]  [id number] DOB: 24-Oct-1965 DOA: 08/21/2022 PCP: Starlyn Skeans, MD     Brief Narrative:  57 year old HF PMHx primary language Spanish (understands significant amount of English) non-small cell lung cancer on chemotherapy, chronic back pain and essential HTN, who underwent first round of induction chemotherapy with carboplatin and Alimta on 08/18/22 and since then started having intractable nausea, vomiting chest pain and shortness of breath and unable to tolerate oral medications and diet   Admitted. She was found to have sided pleural effusion which was drained resulting in pneumothorax which was managed by pigtail catheter placement on 08/24/2022 by IR. Pulmonary and palliative care were consulted. Pigtail catheter removed on 08/28/2022.    Subjective: 3/15 afebrile overnight A/O x 4, negative abdominal pain, negative chest pain, positive increasing SOB especially with ambulation.   Assessment & Plan: Covid vaccination;   Principal Problem:   Pleural effusion on right Active Problems:   Intractable nausea and vomiting   Primary adenocarcinoma of lung (HCC)   Cancer associated pain   Major depression, recurrent, chronic (HCC)   History of ischemic stroke   Goals of care, counseling/discussion   Malignant pleural effusion   Protein-calorie malnutrition, severe (HCC)  Metastatic non-small cell lung cancer -On chemotherapy -Seen by Dr. Sullivan Lone Oncology evaluated the patient during this hospitalization.  Outpatient follow-up with oncology. -Overall prognosis is guarded to poor.   -3/12 per Dr. Sullivan Lone oncology started on Dabrafenib plus Trametinib towards treatment of her metastatic BRAF mutated lung adenocarcinoma.  -3/12per Dr. Sullivan Lone oncology dexamethasone 2 mg daily to suppress medication related fever oncology to wean off as outpatient. -3/13 palliative care evaluated continue current treatment.  Full code.  Patient would be a  good hospice candidate however per palliative care TBD.   -3/14Per Oncology  - have to get more active and be able to nutritionally sustain herself to benefit from palliative treatments.  Right-sided pleural effusion  -S/p Thoracentesis with resultant right-sided hydropneumothorax -3/5 s/p.  Pigtail catheter placed by IR  -3/9 PCCM removed pigtail catheter  -Per PCCM follow-up as outpatient upon discharge.  Signed off 3/11 -3/15 PCXR; recurrence of malignant pleural effusion -3/15 in a.m. will convene multidisciplinary rounds consisting of  Dr. Sullivan Lone oncology, and PCCM.  Discussed placement of Pleurx catheter   Intractable nausea and vomiting -Most likely secondary to chemotherapy - Compazine 10 mg IV PRN.  Will discuss with patient will have to find some PO regimen to control her N/V - Lorazepam 2 mg TID -Emend  150 mg+ dexamethasone 12 mg x 2 more days - Zofran IV 8 mg BID -3/14 resolved  Severe cancer pain  - 3/13 pain controlled today per patient - 3/14 increase Fentanyl patch 25 mcg  Major depression -Zyprexa 10 mg BID  Leukocytosis - Resolved  Hyponatremia - Poor oral intake, most likely component of dehydration - Normal saline 83ml/hr  Hypokalemia - Potassium goal >4 - 3/15 K-Dur 60 mEq -3/15 recheck potassium @1600   Hypocalcemia - Calcium goal> 8.9 - 3/15 corrected calcium= 8.0 - 3/15 Calcium Gluconate 3 g  Hypophosphatemia -Phosphorus goal> 2.5 - 3/14 sodium phosphate 30 mmol   Hx ischemic stroke in 07/2022 --ASA 81 mg daily - Plavix 75 mg daily  HLD - 3/13 lipid panel pending -Patient currently not on statin will start Lipitor in a.m.   Anemia of chronic disease? -Transfuse for hemoglobin<7 -3/13 anemia panel pending  Severe protein calorie malnutrition - 3/14 patient informed family can  bring any food into hospital for her that she will eat. - 3/14 Megace 400 mg BID    Physical deconditioning -PT recommends home health  PT      Mobility Assessment (last 72 hours)     Mobility Assessment     Row Name 09/03/22 1206 09/02/22 2027 09/01/22 2117 09/01/22 1336     Does patient have an order for bedrest or is patient medically unstable -- No - Continue assessment No - Continue assessment --    What is the highest level of mobility based on the progressive mobility assessment? Level 5 (Walks with assist in room/hall) - Balance while stepping forward/back and can walk in room with assist - Complete Level 6 (Walks independently in room and hall) - Balance while walking in room without assist - Complete Level 6 (Walks independently in room and hall) - Balance while walking in room without assist - Complete Level 5 (Walks with assist in room/hall) - Balance while stepping forward/back and can walk in room with assist - Complete                   DVT prophylaxis: Lovenox Code Status: Full Family Communication: 3/15 husband at bedside for discussion of plan of care all questions answered Status is: Inpatient    Dispo: The patient is from: Home              Anticipated d/c is to:  ???              Anticipated d/c date is: > 3 days              Patient currently is not medically stable to d/c.      Consultants:  Oncology Dr.Mitra Lucky Rathke   Procedures/Significant Events:    I have personally reviewed and interpreted all radiology studies and my findings are as above.  VENTILATOR SETTINGS:    Cultures   Antimicrobials: Anti-infectives (From admission, onward)    None         Devices    LINES / TUBES:      Continuous Infusions:  sodium chloride 75 mL/hr at 09/02/22 0536     Objective: Vitals:   09/02/22 1316 09/02/22 2141 09/03/22 0523 09/03/22 1323  BP: (!) 143/83 118/76 120/70 130/88  Pulse: 95 82 88 95  Resp: 20 14 14 20   Temp: 98.5 F (36.9 C) 98 F (36.7 C) 98 F (36.7 C) 98 F (36.7 C)  TempSrc: Oral Oral Oral Oral  SpO2: 93% 96% 93% 95%  Weight:       Height:        Intake/Output Summary (Last 24 hours) at 09/03/2022 1538 Last data filed at 09/03/2022 1512 Gross per 24 hour  Intake 434 ml  Output --  Net 434 ml    Filed Weights   08/21/22 1919 08/22/22 0146  Weight: 46.3 kg 45.8 kg    Physical Exam:  General: A/O x 4, positive acute respiratory distress, cachectic Eyes: negative scleral hemorrhage, negative anisocoria, negative icterus ENT: Negative Runny nose, negative gingival bleeding, Neck:  Negative scars, masses, torticollis, lymphadenopathy, JVD Lungs: decreased breath sounds RML, remainder of lung fields clear to auscultation  without wheezes or crackles Cardiovascular: Regular rate and rhythm without murmur gallop or rub normal S1 and S2 Abdomen: negative abdominal pain, nondistended, positive soft, bowel sounds, no rebound, no ascites, no appreciable mass Extremities: No significant cyanosis, clubbing, or edema bilateral lower extremities Skin: Negative rashes, lesions, ulcers Psychiatric: Positive depression, positive  anxiety, negative fatigue, negative mania  Central nervous system:  Cranial nerves II through XII intact, tongue/uvula midline, all extremities muscle strength 5/5, sensation intact throughout, negative dysarthria, negative expressive aphasia, negative receptive aphasia.    .     Data Reviewed: Care during the described time interval was provided by me .  I have reviewed this patient's available data, including medical history, events of note, physical examination, and all test results as part of my evaluation.  CBC: Recent Labs  Lab 08/30/22 0500 08/31/22 0558 09/01/22 0531 09/02/22 0528 09/03/22 0515  WBC 16.7* 16.9* 5.3 6.4 5.9  NEUTROABS 12.8* 14.8* 3.8 3.7 3.5  HGB 11.1* 11.1* 10.3* 9.6* 9.4*  HCT 32.2* 33.0* 30.5* 28.9* 27.6*  MCV 91.2 91.2 91.6 92.9 91.7  PLT 281 286 321 [id number]    Basic Metabolic Panel: Recent Labs  Lab 08/30/22 0500 08/31/22 0558 09/01/22 0531  09/02/22 0528 09/03/22 0515  NA 129* 133* 134* 132* 132*  K 3.6 2.9* 3.6 4.2 2.9*  CL 100 99 103 106 106  CO2 22 23 22  20* 21*  GLUCOSE 96 114* 113* 95 95  BUN 10 13 15 15 11   CREATININE 0.66 0.56 0.47 0.47 0.49  CALCIUM 7.5* 7.5* 7.3* 7.6* 6.9*  MG 1.8 1.9 2.0 2.0 1.8  PHOS  --   --   --  1.5* 2.5    GFR: Estimated Creatinine Clearance: 56.8 mL/min (by C-G formula based on SCr of 0.49 mg/dL). Liver Function Tests: Recent Labs  Lab 09/01/22 0531 09/02/22 0528 09/03/22 0515  AST 15 23 21   ALT 35 38 33  ALKPHOS 97 112 145*  BILITOT 0.5 0.3 0.4  PROT 5.6* 5.2* 4.8*  ALBUMIN 2.8* 2.8* 2.6*    No results for input(s): "LIPASE", "AMYLASE" in the last 168 hours. No results for input(s): "AMMONIA" in the last 168 hours. Coagulation Profile: No results for input(s): "INR", "PROTIME" in the last 168 hours. Cardiac Enzymes: No results for input(s): "CKTOTAL", "CKMB", "CKMBINDEX", "TROPONINI" in the last 168 hours. BNP (last 3 results) No results for input(s): "PROBNP" in the last 8760 hours. HbA1C: No results for input(s): "HGBA1C" in the last 72 hours. CBG: No results for input(s): "GLUCAP" in the last 168 hours. Lipid Profile: Recent Labs    09/02/22 0528  CHOL 124  HDL 46  LDLCALC 49  TRIG 146  CHOLHDL 2.7    Thyroid Function Tests: No results for input(s): "TSH", "T4TOTAL", "FREET4", "T3FREE", "THYROIDAB" in the last 72 hours. Anemia Panel: Recent Labs    09/02/22 0528  VITAMINB12 957*  FOLATE 8.9  FERRITIN 211  TIBC 209*  IRON 80  RETICCTPCT 2.3    Sepsis Labs: No results for input(s): "PROCALCITON", "LATICACIDVEN" in the last 168 hours.  No results found for this or any previous visit (from the past 240 hour(s)).        Radiology Studies: DG CHEST PORT 1 VIEW  Result Date: 09/03/2022 CLINICAL DATA:  Pleural effusion EXAM: PORTABLE CHEST 1 VIEW COMPARISON:  08/29/2022 FINDINGS: There is a right chest wall port a catheter with tip at the  superior cavoatrial junction. Stable cardiac enlargement. Small right pleural effusion appears increased from previous exam. Right perihilar lung mass with postobstructive changes in the right upper lobe and right middle lobe are again noted. Similar appearance of left upper lobe and superior segment of left lower lobe perihilar opacities. IMPRESSION: 1. Increase in small right pleural effusion. 2. No change in right perihilar lung mass with postobstructive changes  in the right upper lobe and right middle lobe. 3. Left lung opacities are unchanged. Electronically Signed   By: Kerby Moors M.D.   On: 09/03/2022 07:02        Scheduled Meds:  aspirin EC  81 mg Oral Daily   Chlorhexidine Gluconate Cloth  6 each Topical Daily   clopidogrel  75 mg Oral Daily   dabrafenib mesylate  150 mg Oral Q12H   dexamethasone  2 mg Oral Daily   diclofenac Sodium  4 g Topical QID   enoxaparin (LOVENOX) injection  40 mg Subcutaneous Q24H   feeding supplement  1 Container Oral TID BM   fentaNYL  1 patch Transdermal [id number]   folic acid  1 mg Oral Daily   LORazepam  2 mg Sublingual TID AC & HS   megestrol  400 mg Oral BID   OLANZapine zydis  10 mg Oral Daily   OLANZapine zydis  5 mg Oral QHS   ondansetron  8 mg Oral Q12H   pantoprazole (PROTONIX) IV  40 mg Intravenous Q12H   senna  1 tablet Oral QHS   sertraline  100 mg Oral BID   trametinib dimethyl sulfoxide  2 mg Oral Daily   Continuous Infusions:  sodium chloride 75 mL/hr at 09/02/22 0536     LOS: 12 days    Time spent:40 min    Adir Schicker, Geraldo Docker, MD [facility]   If 7PM-7AM, please contact night-coverage 09/03/2022, 3:38 PM

## 2022-09-04 DIAGNOSIS — C3490 Malignant neoplasm of unspecified part of unspecified bronchus or lung: Secondary | ICD-10-CM | POA: Diagnosis not present

## 2022-09-04 DIAGNOSIS — Z515 Encounter for palliative care: Secondary | ICD-10-CM | POA: Diagnosis not present

## 2022-09-04 DIAGNOSIS — J90 Pleural effusion, not elsewhere classified: Secondary | ICD-10-CM | POA: Diagnosis not present

## 2022-09-04 DIAGNOSIS — G893 Neoplasm related pain (acute) (chronic): Secondary | ICD-10-CM | POA: Diagnosis not present

## 2022-09-04 DIAGNOSIS — R112 Nausea with vomiting, unspecified: Secondary | ICD-10-CM | POA: Diagnosis not present

## 2022-09-04 LAB — CBC WITH DIFFERENTIAL/PLATELET
Abs Immature Granulocytes: 0.02 10*3/uL (ref 0.00–0.07)
Basophils Absolute: 0 10*3/uL (ref 0.0–0.1)
Basophils Relative: 0 %
Eosinophils Absolute: 0 10*3/uL (ref 0.0–0.5)
Eosinophils Relative: 0 %
HCT: 29.4 % — ABNORMAL LOW (ref 36.0–46.0)
Hemoglobin: 10.1 g/dL — ABNORMAL LOW (ref 12.0–15.0)
Immature Granulocytes: 0 %
Lymphocytes Relative: 41 %
Lymphs Abs: 2.4 10*3/uL (ref 0.7–4.0)
MCH: 31.6 pg (ref 26.0–34.0)
MCHC: 34.4 g/dL (ref 30.0–36.0)
MCV: 91.9 fL (ref 80.0–100.0)
Monocytes Absolute: 0.3 10*3/uL (ref 0.1–1.0)
Monocytes Relative: 4 %
Neutro Abs: 3.2 10*3/uL (ref 1.7–7.7)
Neutrophils Relative %: 55 %
Platelets: 426 10*3/uL — ABNORMAL HIGH (ref 150–400)
RBC: 3.2 MIL/uL — ABNORMAL LOW (ref 3.87–5.11)
RDW: 13.6 % (ref 11.5–15.5)
WBC: 5.9 10*3/uL (ref 4.0–10.5)
nRBC: 0 % (ref 0.0–0.2)

## 2022-09-04 LAB — COMPREHENSIVE METABOLIC PANEL
ALT: 31 U/L (ref 0–44)
AST: 22 U/L (ref 15–41)
Albumin: 2.5 g/dL — ABNORMAL LOW (ref 3.5–5.0)
Alkaline Phosphatase: 232 U/L — ABNORMAL HIGH (ref 38–126)
Anion gap: 6 (ref 5–15)
BUN: 11 mg/dL (ref 6–20)
CO2: 21 mmol/L — ABNORMAL LOW (ref 22–32)
Calcium: 7.6 mg/dL — ABNORMAL LOW (ref 8.9–10.3)
Chloride: 104 mmol/L (ref 98–111)
Creatinine, Ser: 0.41 mg/dL — ABNORMAL LOW (ref 0.44–1.00)
GFR, Estimated: >60 mL/min (ref >60)
Glucose, Bld: 97 mg/dL (ref 70–99)
Potassium: 3.4 mmol/L — ABNORMAL LOW (ref 3.5–5.1)
Sodium: 131 mmol/L — ABNORMAL LOW (ref 135–145)
Total Bilirubin: 0.3 mg/dL (ref 0.3–1.2)
Total Protein: 4.8 g/dL — ABNORMAL LOW (ref 6.5–8.1)

## 2022-09-04 LAB — MAGNESIUM: Magnesium: 1.5 mg/dL — ABNORMAL LOW (ref 1.7–2.4)

## 2022-09-04 LAB — PHOSPHORUS: Phosphorus: 1.9 mg/dL — ABNORMAL LOW (ref 2.5–4.6)

## 2022-09-04 MED ORDER — POTASSIUM CHLORIDE CRYS ER 20 MEQ PO TBCR
50.0000 meq | EXTENDED_RELEASE_TABLET | Freq: Once | ORAL | Status: AC
  Administered 2022-09-04: 50 meq via ORAL
  Filled 2022-09-04: qty 1

## 2022-09-04 MED ORDER — DM-GUAIFENESIN ER 30-600 MG PO TB12
1.0000 | ORAL_TABLET | Freq: Two times a day (BID) | ORAL | Status: DC
  Administered 2022-09-04 – 2022-09-07 (×7): 1 via ORAL
  Filled 2022-09-04 (×7): qty 1

## 2022-09-04 MED ORDER — POTASSIUM PHOSPHATES 15 MMOLE/5ML IV SOLN
30.0000 mmol | Freq: Once | INTRAVENOUS | Status: AC
  Administered 2022-09-04: 30 mmol via INTRAVENOUS
  Filled 2022-09-04: qty 10

## 2022-09-04 MED ORDER — CALCIUM GLUCONATE-NACL 2-0.675 GM/100ML-% IV SOLN
2.0000 g | Freq: Once | INTRAVENOUS | Status: AC
  Administered 2022-09-04: 2000 mg via INTRAVENOUS
  Filled 2022-09-04: qty 100

## 2022-09-04 MED ORDER — MAGNESIUM SULFATE 4 GM/100ML IV SOLN
4.0000 g | Freq: Once | INTRAVENOUS | Status: AC
  Administered 2022-09-04: 4 g via INTRAVENOUS
  Filled 2022-09-04: qty 100

## 2022-09-04 NOTE — Progress Notes (Signed)
   NAME:  Karen [name], MRN:  [id number], DOB:  05/18/1966, LOS: 66 ADMISSION DATE:  08/21/2022, CONSULTATION DATE:  08/24/22 REFERRING MD:  Sloan Leiter, CHIEF COMPLAINT:  dyspnea   History of Present Illness:  81yF with recently discovered stage IV NSCLC with mets to bone sp first course of palliative chemo, vocal cord paralysis, latent TB, recent lacunar infarct who has had intractable nausea, vomiting, chest pain, dyspnea since starting treatment. She was found to have R pleural effusion and underwent thora 3/3 c/b enlarging hydropneumothorax now s/p IR pigtail placement 3/5.  Chest tube was discontinued 3/9  Asked to see by Dr. Sherral Hammers for shortness of breath and recurrent pleural effusion Pertinent  Medical History  Stage IV NSCLC with mets to bone Vocal cord paralysis Latent TB Lacunar infarct  Significant Hospital Events: Including procedures, antibiotic start and stop dates in addition to other pertinent events   3/3 thoracentesis with 400 cc removed 3/5 IR pigtail for enlarging hydropneumothorax 3/9 chest tube discontinued  Interim History / Subjective:  Denies significant complaints at present States her breathing is fine Denies any chest pain or discomfort  Objective   Blood pressure 121/75, pulse 91, temperature 98.2 F (36.8 C), temperature source Oral, resp. rate 17, height 5\' 2"  (1.575 m), weight 45.8 kg, last menstrual period 02/08/2006, SpO2 90 %.        Intake/Output Summary (Last 24 hours) at 09/04/2022 1121 Last data filed at 09/04/2022 0045 Gross per 24 hour  Intake 249.44 ml  Output --  Net 249.44 ml   Filed Weights   08/21/22 1919 08/22/22 0146  Weight: 46.3 kg 45.8 kg    Examination: Chronically ill-appearing Does not appear to be in respiratory distress Decreased air movement bilaterally S1-S2 appreciated Bowel sounds appreciated No peripheral edema  CT chest 08/21/2022 reviewed-progressive disease, pleural effusion Sequential chest x-rays  reviewed Most recent chest x-ray Resolved Hospital Problem list    Assessment & Plan:   Patient with progressive metastatic adenocarcinoma of the lung, malignant pleural effusion Started on immunotherapy  Post recent thoracentesis with hydropneumothorax  She does have a small effusion at present based on most recent chest x-ray I do not believe effusion is contributing to symptoms at present  Repeat chest x-ray in 2 to 3 days to assess for effusion  If significant reaccumulation of fluid, may offer her thoracentesis  A Pleurx catheter will only be appropriate if significant effusion contributing to shortness of breath, rapid reaccumulation of fluid  Continue current lines of care  Sherrilyn Rist, MD [place] [facility] Pager: See Shea Evans

## 2022-09-04 NOTE — Progress Notes (Signed)
PROGRESS NOTE    Karen [name]  [id number] DOB: 1965-10-16 DOA: 08/21/2022 PCP: Starlyn Skeans, MD     Brief Narrative:  57 year old HF PMHx primary language Spanish (understands significant amount of English) non-small cell lung cancer on chemotherapy, chronic back pain and essential HTN, who underwent first round of induction chemotherapy with carboplatin and Alimta on 08/18/22 and since then started having intractable nausea, vomiting chest pain and shortness of breath and unable to tolerate oral medications and diet   Admitted. She was found to have sided pleural effusion which was drained resulting in pneumothorax which was managed by pigtail catheter placement on 08/24/2022 by IR. Pulmonary and palliative care were consulted. Pigtail catheter removed on 08/28/2022.    Subjective: On 3/16 afebrile overnight A/O x 4, negative abdominal pain, negative chest pain.  Positive DOE, negative SOB.   Assessment & Plan: Covid vaccination;   Principal Problem:   Pleural effusion on right Active Problems:   Intractable nausea and vomiting   Primary adenocarcinoma of lung (HCC)   Cancer associated pain   Major depression, recurrent, chronic (HCC)   History of ischemic stroke   Goals of care, counseling/discussion   Malignant pleural effusion   Protein-calorie malnutrition, severe (HCC)  Metastatic non-small cell lung cancer -On chemotherapy -Seen by Dr. Sullivan Lone On[facility] evaluated the patient during this hospitalization.  Outpatient follow-up with oncology. -Overall prognosis is guarded to poor.   -3/12 per Dr. Sullivan Lone oncology started on Dabrafenib plus Trametinib towards treatment of her metastatic BRAF mutated lung adenocarcinoma.  -3/12per Dr. Sullivan Lone oncology dexamethasone 2 mg daily to suppress medication related fever oncology to wean off as outpatient. -3/13 palliative care evaluated continue current treatment.  Full code.  Patient would be a good hospice  candidate however per palliative care TBD.   -3/14Per Oncology  - have to get more active and be able to nutritionally sustain herself to benefit from palliative treatments. -3/16 discussed case with [facility] PCCM regarding the fact that RIGHT metastatic pleural effusion is beginning to reaccumulate.  Will see patient and discuss placement of Pleurx cath vs frequent thoracentesis.  Do not like to place Pleurx cath in malignant pleural effusions.  Will await recommendations.  Also included Dr. Sullivan Lone oncology in chat. -3/16 ADDENDUM;Dr.Adewale Olalere PCCM concurred that placing a pigtail catheter at this time would not be appropriate.  Thoracentesis for significant reaccumulation (expect by next 7 to 10 days will require additional thoracentesis)  Right-sided pleural effusion  -S/p Thoracentesis with resultant right-sided hydropneumothorax -3/5 s/p.  Pigtail catheter placed by IR  -3/9 PCCM removed pigtail catheter  -Per PCCM follow-up as outpatient upon discharge.  Signed off 3/11 -3/15 PCXR; recurrence of malignant pleural effusion -3/15 in a.m. will convene multidisciplinary rounds consisting of  Dr. Sullivan Lone oncology, and PCCM.  Discussed placement of Pleurx catheter -3/16 See metastatic lung cancer.  No Pleurx cath   Intractable nausea and vomiting -Most likely secondary to chemotherapy - Compazine 10 mg IV PRN.  Will discuss with patient will have to find some PO regimen to control her N/V - Lorazepam 2 mg TID -Emend  150 mg+ dexamethasone 12 mg x 2 more days - Zofran IV 8 mg BID -3/14 resolved  Severe cancer pain  - 3/13 pain controlled today per patient - 3/14 increase Fentanyl patch 25 mcg  Major depression -Zyprexa 10 mg BID  Leukocytosis - Resolved  Hyponatremia - Poor oral intake, most likely component of dehydration - Normal saline 37ml/hr  Hypokalemia - Potassium goal >4 - 3/15 K-Dur 60 mEq -3/15 recheck potassium @1600  -3/16 K-Phos 30 mmol +  K-Dur 50 mEq  Hypocalcemia - Calcium goal> 8.9 - 3/15 corrected calcium= 8.0 - 3/15 Calcium Gluconate 3 g -3/16 corrected calcium= 8.8 -3/16 Calcium Gluconate 2 g  Hypophosphatemia -Phosphorus goal> 2.5 - 3/14 sodium phosphate 30 mmol -3/16 see hypokalemia  Hypomagnesmia - Magnesium goal> 2 - 3/16 Magnesium IV 4 g   Hx ischemic stroke in 07/2022 --ASA 81 mg daily - Plavix 75 mg daily  HLD - 3/13 lipid panel pending -Patient currently not on statin will start Lipitor in a.m.   Anemia of chronic disease? -Transfuse for hemoglobin<7 -3/13 anemia panel pending  Severe protein calorie malnutrition - 3/14 patient informed family can bring any food into hospital for her that she will eat. - 3/14 Megace 400 mg BID    Physical deconditioning -PT recommends home health PT      Mobility Assessment (last 72 hours)     Mobility Assessment     Row Name 09/03/22 2140 09/03/22 1206 09/02/22 2027 09/01/22 2117 09/01/22 1336   Does patient have an order for bedrest or is patient medically unstable No - Continue assessment -- No - Continue assessment No - Continue assessment --   What is the highest level of mobility based on the progressive mobility assessment? Level 6 (Walks independently in room and hall) - Balance while walking in room without assist - Complete Level 5 (Walks with assist in room/hall) - Balance while stepping forward/back and can walk in room with assist - Complete Level 6 (Walks independently in room and hall) - Balance while walking in room without assist - Complete Level 6 (Walks independently in room and hall) - Balance while walking in room without assist - Complete Level 5 (Walks with assist in room/hall) - Balance while stepping forward/back and can walk in room with assist - Complete                  DVT prophylaxis: Lovenox Code Status: Full Family Communication: 3/16 husband at bedside for discussion of plan of care all questions  answered Status is: Inpatient    Dispo: The patient is from: Home              Anticipated d/c is to:  ???              Anticipated d/c date is: > 3 days              Patient currently is not medically stable to d/c.      Consultants:  Oncology Dr.Mitra Marlyce Huge PCCM   Procedures/Significant Events:    I have personally reviewed and interpreted all radiology studies and my findings are as above.  VENTILATOR SETTINGS:    Cultures   Antimicrobials: Anti-infectives (From admission, onward)    None         Devices    LINES / TUBES:      Continuous Infusions:  sodium chloride 75 mL/hr at 09/02/22 0536     Objective: Vitals:   09/03/22 0523 09/03/22 1323 09/03/22 2058 09/04/22 0520  BP: 120/70 130/88 131/82 121/75  Pulse: 88 95 95 91  Resp: 14 20 16 17   Temp: 98 F (36.7 C) 98 F (36.7 C) 98.1 F (36.7 C) 98.2 F (36.8 C)  TempSrc: Oral Oral Oral Oral  SpO2: 93% 95% 95% 90%  Weight:      Height:  Intake/Output Summary (Last 24 hours) at 09/04/2022 0743 Last data filed at 09/04/2022 0045 Gross per 24 hour  Intake 249.44 ml  Output --  Net 249.44 ml    Filed Weights   08/21/22 1919 08/22/22 0146  Weight: 46.3 kg 45.8 kg   Physical Exam:  General: A/O x 4, No acute respiratory distress, cachectic Eyes: negative scleral hemorrhage, negative anisocoria, negative icterus ENT: Negative Runny nose, negative gingival bleeding, Neck:  Negative scars, masses, torticollis, lymphadenopathy, JVD Lungs: mild decreased RML sounds, remainder lung fields clear to auscultation, without wheezes or crackles Cardiovascular: Regular rate and rhythm without murmur gallop or rub normal S1 and S2 Abdomen: negative abdominal pain, nondistended, positive soft, bowel sounds, no rebound, no ascites, no appreciable mass Extremities: No significant cyanosis, clubbing, or edema bilateral lower extremities Skin: Negative rashes, lesions,  ulcers Psychiatric:  Negative depression, negative anxiety, negative fatigue, negative mania  Central nervous system:  Cranial nerves II through XII intact, tongue/uvula midline, all extremities muscle strength 5/5, sensation intact throughout, negative dysarthria, negative expressive aphasia, negative receptive aphasia.    .     Data Reviewed: Care during the described time interval was provided by me .  I have reviewed this patient's available data, including medical history, events of note, physical examination, and all test results as part of my evaluation.  CBC: Recent Labs  Lab 08/31/22 0558 09/01/22 0531 09/02/22 0528 09/03/22 0515 09/04/22 0542  WBC 16.9* 5.3 6.4 5.9 5.9  NEUTROABS 14.8* 3.8 3.7 3.5 3.2  HGB 11.1* 10.3* 9.6* 9.4* 10.1*  HCT 33.0* 30.5* 28.9* 27.6* 29.4*  MCV 91.2 91.6 92.9 91.7 91.9  PLT 286 321 358 345 426*    Basic Metabolic Panel: Recent Labs  Lab 09/01/22 0531 09/02/22 0528 09/03/22 0515 09/03/22 1600 09/04/22 0542  NA 134* 132* 132* 133* 131*  K 3.6 4.2 2.9* 3.4* 3.4*  CL 103 106 106 108 104  CO2 22 20* 21* 19* 21*  GLUCOSE 113* 95 95 137* 97  BUN 15 15 11 10 11   CREATININE 0.47 0.47 0.49 0.49 0.41*  CALCIUM 7.3* 7.6* 6.9* 7.4* 7.6*  MG 2.0 2.0 1.8 1.8 1.5*  PHOS  --  1.5* 2.5 1.8* 1.9*    GFR: Estimated Creatinine Clearance: 56.8 mL/min (A) (by C-G formula based on SCr of 0.41 mg/dL (L)). Liver Function Tests: Recent Labs  Lab 09/01/22 0531 09/02/22 0528 09/03/22 0515 09/04/22 0542  AST 15 23 21 22   ALT 35 38 33 31  ALKPHOS 97 112 145* 232*  BILITOT 0.5 0.3 0.4 0.3  PROT 5.6* 5.2* 4.8* 4.8*  ALBUMIN 2.8* 2.8* 2.6* 2.5*    No results for input(s): "LIPASE", "AMYLASE" in the last 168 hours. No results for input(s): "AMMONIA" in the last 168 hours. Coagulation Profile: No results for input(s): "INR", "PROTIME" in the last 168 hours. Cardiac Enzymes: No results for input(s): "CKTOTAL", "CKMB", "CKMBINDEX", "TROPONINI" in  the last 168 hours. BNP (last 3 results) No results for input(s): "PROBNP" in the last 8760 hours. HbA1C: No results for input(s): "HGBA1C" in the last 72 hours. CBG: No results for input(s): "GLUCAP" in the last 168 hours. Lipid Profile: Recent Labs    09/02/22 0528  CHOL 124  HDL 46  LDLCALC 49  TRIG 146  CHOLHDL 2.7    Thyroid Function Tests: No results for input(s): "TSH", "T4TOTAL", "FREET4", "T3FREE", "THYROIDAB" in the last 72 hours. Anemia Panel: Recent Labs    09/02/22 0528  VITAMINB12 957*  FOLATE 8.9  FERRITIN  211  TIBC 209*  IRON 80  RETICCTPCT 2.3    Sepsis Labs: No results for input(s): "PROCALCITON", "LATICACIDVEN" in the last 168 hours.  No results found for this or any previous visit (from the past 240 hour(s)).        Radiology Studies: DG CHEST PORT 1 VIEW  Result Date: 09/03/2022 CLINICAL DATA:  Pleural effusion EXAM: PORTABLE CHEST 1 VIEW COMPARISON:  08/29/2022 FINDINGS: There is a right chest wall port a catheter with tip at the superior cavoatrial junction. Stable cardiac enlargement. Small right pleural effusion appears increased from previous exam. Right perihilar lung mass with postobstructive changes in the right upper lobe and right middle lobe are again noted. Similar appearance of left upper lobe and superior segment of left lower lobe perihilar opacities. IMPRESSION: 1. Increase in small right pleural effusion. 2. No change in right perihilar lung mass with postobstructive changes in the right upper lobe and right middle lobe. 3. Left lung opacities are unchanged. Electronically Signed   By: Kerby Moors M.D.   On: 09/03/2022 07:02        Scheduled Meds:  aspirin EC  81 mg Oral Daily   Chlorhexidine Gluconate Cloth  6 each Topical Daily   clopidogrel  75 mg Oral Daily   dabrafenib mesylate  150 mg Oral Q12H   dexamethasone  2 mg Oral Daily   diclofenac Sodium  4 g Topical QID   enoxaparin (LOVENOX) injection  40 mg  Subcutaneous Q24H   feeding supplement  1 Container Oral TID BM   fentaNYL  1 patch Transdermal [id number]   folic acid  1 mg Oral Daily   LORazepam  2 mg Sublingual TID AC & HS   megestrol  400 mg Oral BID   OLANZapine zydis  10 mg Oral Daily   OLANZapine zydis  5 mg Oral QHS   ondansetron  8 mg Oral Q12H   pantoprazole (PROTONIX) IV  40 mg Intravenous Q12H   senna  1 tablet Oral QHS   sertraline  100 mg Oral BID   trametinib dimethyl sulfoxide  2 mg Oral Daily   Continuous Infusions:  sodium chloride 75 mL/hr at 09/02/22 0536     LOS: 13 days    Time spent:40 min    Noele Icenhour, Geraldo Docker, MD [facility]   If 7PM-7AM, please contact night-coverage 09/04/2022, 7:43 AM

## 2022-09-05 DIAGNOSIS — J90 Pleural effusion, not elsewhere classified: Secondary | ICD-10-CM | POA: Diagnosis not present

## 2022-09-05 DIAGNOSIS — R630 Anorexia: Secondary | ICD-10-CM | POA: Diagnosis not present

## 2022-09-05 DIAGNOSIS — F411 Generalized anxiety disorder: Secondary | ICD-10-CM | POA: Diagnosis not present

## 2022-09-05 DIAGNOSIS — R112 Nausea with vomiting, unspecified: Secondary | ICD-10-CM | POA: Diagnosis not present

## 2022-09-05 LAB — COMPREHENSIVE METABOLIC PANEL
ALT: 30 U/L (ref 0–44)
AST: 23 U/L (ref 15–41)
Albumin: 2.7 g/dL — ABNORMAL LOW (ref 3.5–5.0)
Alkaline Phosphatase: 295 U/L — ABNORMAL HIGH (ref 38–126)
Anion gap: 7 (ref 5–15)
BUN: 14 mg/dL (ref 6–20)
CO2: 21 mmol/L — ABNORMAL LOW (ref 22–32)
Calcium: 7.1 mg/dL — ABNORMAL LOW (ref 8.9–10.3)
Chloride: 105 mmol/L (ref 98–111)
Creatinine, Ser: 0.52 mg/dL (ref 0.44–1.00)
GFR, Estimated: >60 mL/min (ref >60)
Glucose, Bld: 98 mg/dL (ref 70–99)
Potassium: 3.8 mmol/L (ref 3.5–5.1)
Sodium: 133 mmol/L — ABNORMAL LOW (ref 135–145)
Total Bilirubin: 0.3 mg/dL (ref 0.3–1.2)
Total Protein: 4.9 g/dL — ABNORMAL LOW (ref 6.5–8.1)

## 2022-09-05 LAB — CBC WITH DIFFERENTIAL/PLATELET
Abs Immature Granulocytes: 0.02 10*3/uL (ref 0.00–0.07)
Basophils Absolute: 0 10*3/uL (ref 0.0–0.1)
Basophils Relative: 0 %
Eosinophils Absolute: 0.1 10*3/uL (ref 0.0–0.5)
Eosinophils Relative: 1 %
HCT: 31.4 % — ABNORMAL LOW (ref 36.0–46.0)
Hemoglobin: 10.6 g/dL — ABNORMAL LOW (ref 12.0–15.0)
Immature Granulocytes: 0 %
Lymphocytes Relative: 37 %
Lymphs Abs: 2.8 10*3/uL (ref 0.7–4.0)
MCH: 31 pg (ref 26.0–34.0)
MCHC: 33.8 g/dL (ref 30.0–36.0)
MCV: 91.8 fL (ref 80.0–100.0)
Monocytes Absolute: 0.3 10*3/uL (ref 0.1–1.0)
Monocytes Relative: 4 %
Neutro Abs: 4.4 10*3/uL (ref 1.7–7.7)
Neutrophils Relative %: 58 %
Platelets: 509 10*3/uL — ABNORMAL HIGH (ref 150–400)
RBC: 3.42 MIL/uL — ABNORMAL LOW (ref 3.87–5.11)
RDW: 14 % (ref 11.5–15.5)
WBC: 7.6 10*3/uL (ref 4.0–10.5)
nRBC: 0 % (ref 0.0–0.2)

## 2022-09-05 LAB — PHOSPHORUS: Phosphorus: 3.1 mg/dL (ref 2.5–4.6)

## 2022-09-05 LAB — MAGNESIUM: Magnesium: 2.1 mg/dL (ref 1.7–2.4)

## 2022-09-05 MED ORDER — SODIUM CHLORIDE 0.9 % IV SOLN
4.0000 g | Freq: Once | INTRAVENOUS | Status: AC
  Administered 2022-09-05: 4 g via INTRAVENOUS
  Filled 2022-09-05 (×2): qty 40

## 2022-09-05 NOTE — Progress Notes (Signed)
PROGRESS NOTE    Karen [name]  [id number] DOB: [birth year] DOA: 08/21/2022 PCP: Starlyn Skeans, MD     Brief Narrative:  57 year old HF PMHx primary language Spanish (understands significant amount of English) non-small cell lung cancer on chemotherapy, chronic back pain and essential HTN, who underwent first round of induction chemotherapy with carboplatin and Alimta on 08/18/22 and since then started having intractable nausea, vomiting chest pain and shortness of breath and unable to tolerate oral medications and diet   Admitted. She was found to have sided pleural effusion which was drained resulting in pneumothorax which was managed by pigtail catheter placement on 08/24/2022 by IR. Pulmonary and palliative care were consulted. Pigtail catheter removed on 08/28/2022.    Subjective: 3/17 afebrile overnight A/O x 4, negative abdominal pain, negative chest pain, states overnight was having loose stools (explained secondary to p.o. magnesium most likely), increased urine frequency with loss of control.  Negative dysuria.    Assessment & Plan: Covid vaccination;   Principal Problem:   Pleural effusion on right Active Problems:   Intractable nausea and vomiting   Primary adenocarcinoma of lung (HCC)   Cancer associated pain   Major depression, recurrent, chronic (HCC)   History of ischemic stroke   Goals of care, counseling/discussion   Malignant pleural effusion   Protein-calorie malnutrition, severe (HCC)  Metastatic non-small cell lung cancer -On chemotherapy -Seen by Dr. Sullivan Lone On[facility] evaluated the patient during this hospitalization.  Outpatient follow-up with oncology. -Overall prognosis is guarded to poor.   -3/12 per Dr. Sullivan Lone oncology started on Dabrafenib plus Trametinib towards treatment of her metastatic BRAF mutated lung adenocarcinoma.  -3/12per Dr. Sullivan Lone oncology dexamethasone 2 mg daily to suppress medication related fever oncology to wean  off as outpatient. -3/13 palliative care evaluated continue current treatment.  Full code.  Patient would be a good hospice candidate however per palliative care TBD.   -3/14Per Oncology  - have to get more active and be able to nutritionally sustain herself to benefit from palliative treatments. -3/16 discussed case with [facility] PCCM regarding the fact that RIGHT metastatic pleural effusion is beginning to reaccumulate.  Will see patient and discuss placement of Pleurx cath vs frequent thoracentesis.  Do not like to place Pleurx cath in malignant pleural effusions.  Will await recommendations.  Also included Dr. Sullivan Lone oncology in chat. -3/16 ADDENDUM;Dr.Adewale Olalere PCCM concurred that placing a pigtail catheter at this time would not be appropriate.  Thoracentesis for significant reaccumulation (expect by next 7 to 10 days will require additional thoracentesis)  Right-sided pleural effusion  -S/p Thoracentesis with resultant right-sided hydropneumothorax -3/5 s/p.  Pigtail catheter placed by IR  -3/9 PCCM removed pigtail catheter  -Per PCCM follow-up as outpatient upon discharge.  Signed off 3/11 -3/15 PCXR; recurrence of malignant pleural effusion -3/15 in a.m. will convene multidisciplinary rounds consisting of  Dr. Sullivan Lone oncology, and PCCM.  Discussed placement of Pleurx catheter -3/16 See metastatic lung cancer.  No Pleurx cath   Intractable nausea and vomiting -Most likely secondary to chemotherapy - Compazine 10 mg IV PRN.  Will discuss with patient will have to find some PO regimen to control her N/V - Lorazepam 2 mg TID -Emend  150 mg+ dexamethasone 12 mg x 2 more days - Zofran IV 8 mg BID -3/14 resolved  Severe cancer pain  - 3/13 pain controlled today per patient - 3/14 increase Fentanyl patch 25 mcg  Major depression -Zyprexa 10 mg BID  Stress  incontinence vs UTI - 3/17 urine culture pending - 3/17 if negative begin medication for stress  incontinence  Leukocytosis - Resolved  Hyponatremia - Poor oral intake, most likely component of dehydration - Normal saline 66ml/hr  Hypokalemia - Potassium goal >4 - 3/15 K-Dur 60 mEq -3/15 recheck potassium @1600  -3/16 K-Phos 30 mmol + K-Dur 50 mEq  Hypocalcemia - Calcium goal> 8.9 - 3/15 corrected calcium= 8.0 - 3/15 Calcium Gluconate 3 g -3/16 corrected calcium= 8.8 -3/16 = calcium gluconate 2 g -3/17= corrected calcium= 8.1 - 3/17 calcium gluconate 4 g  Hypophosphatemia -Phosphorus goal> 2.5 - 3/14 sodium phosphate 30 mmol -3/16 see hypokalemia  Hypomagnesmia - Magnesium goal> 2 - 3/16 Magnesium IV 4 g   Hx ischemic stroke in 07/2022 --ASA 81 mg daily - Plavix 75 mg daily  HLD - 3/13 lipid panel pending -Patient currently not on statin will start Lipitor in a.m.   Anemia of chronic disease? -Transfuse for hemoglobin<7 -3/13 anemia panel pending  Severe protein calorie malnutrition - 3/14 patient informed family can bring any food into hospital for her that she will eat. - 3/14 Megace 400 mg BID    Physical deconditioning -PT recommends home health PT      Mobility Assessment (last 72 hours)     Mobility Assessment     Row Name 09/04/22 1940 09/03/22 2140 09/03/22 1206 09/02/22 2027     Does patient have an order for bedrest or is patient medically unstable No - Continue assessment No - Continue assessment -- No - Continue assessment    What is the highest level of mobility based on the progressive mobility assessment? Level 6 (Walks independently in room and hall) - Balance while walking in room without assist - Complete Level 6 (Walks independently in room and hall) - Balance while walking in room without assist - Complete Level 5 (Walks with assist in room/hall) - Balance while stepping forward/back and can walk in room with assist - Complete Level 6 (Walks independently in room and hall) - Balance while walking in room without assist - Complete                    DVT prophylaxis: Lovenox Code Status: Full Family Communication: 3/16 husband at bedside for discussion of plan of care all questions answered Status is: Inpatient    Dispo: The patient is from: Home              Anticipated d/c is to:  ???              Anticipated d/c date is: > 3 days              Patient currently is not medically stable to d/c.      Consultants:  Oncology Dr.Mitra Marlyce Huge PCCM   Procedures/Significant Events:    I have personally reviewed and interpreted all radiology studies and my findings are as above.  VENTILATOR SETTINGS:    Cultures   Antimicrobials: Anti-infectives (From admission, onward)    None         Devices    LINES / TUBES:      Continuous Infusions:  sodium chloride 75 mL/hr at 09/02/22 0536     Objective: Vitals:   09/04/22 0520 09/04/22 1353 09/04/22 2126 09/05/22 0554  BP: 121/75 117/85 101/70 100/67  Pulse: 91 (!) 101 97 98  Resp: 17 17 14 14   Temp: 98.2 F (36.8 C) 98.1 F (36.7 C) 97.8 F (36.6  C) 98.1 F (36.7 C)  TempSrc: Oral Oral Oral Oral  SpO2: 90% 94% 96% 91%  Weight:      Height:        Intake/Output Summary (Last 24 hours) at 09/05/2022 1249 Last data filed at 09/05/2022 1100 Gross per 24 hour  Intake 2316.38 ml  Output --  Net 2316.38 ml    Filed Weights   08/21/22 1919 08/22/22 0146  Weight: 46.3 kg 45.8 kg   Physical Exam:  General: A/O x 4, No acute respiratory distress, cachectic Eyes: negative scleral hemorrhage, negative anisocoria, negative icterus ENT: Negative Runny nose, negative gingival bleeding, Neck:  Negative scars, masses, torticollis, lymphadenopathy, JVD Lungs: decreased RML sounds, remainder lung fields clear to auscultation, without wheezes or crackles Cardiovascular: Regular rate and rhythm without murmur gallop or rub normal S1 and S2 Abdomen: negative abdominal pain, nondistended, positive soft, bowel  sounds, no rebound, no ascites, no appreciable mass Extremities: No significant cyanosis, clubbing, or edema bilateral lower extremities Skin: Negative rashes, lesions, ulcers Psychiatric:  Negative depression, negative anxiety, negative fatigue, negative mania  Central nervous system:  Cranial nerves II through XII intact, tongue/uvula midline, all extremities muscle strength 5/5, sensation intact throughout, negative dysarthria, negative expressive aphasia, negative receptive aphasia.    .     Data Reviewed: Care during the described time interval was provided by me .  I have reviewed this patient's available data, including medical history, events of note, physical examination, and all test results as part of my evaluation.  CBC: Recent Labs  Lab 09/01/22 0531 09/02/22 0528 09/03/22 0515 09/04/22 0542 09/05/22 0555  WBC 5.3 6.4 5.9 5.9 7.6  NEUTROABS 3.8 3.7 3.5 3.2 4.4  HGB 10.3* 9.6* 9.4* 10.1* 10.6*  HCT 30.5* 28.9* 27.6* 29.4* 31.4*  MCV 91.6 92.9 91.7 91.9 91.8  PLT 321 358 345 426* 509*    Basic Metabolic Panel: Recent Labs  Lab 09/02/22 0528 09/03/22 0515 09/03/22 1600 09/04/22 0542 09/05/22 0555  NA 132* 132* 133* 131* 133*  K 4.2 2.9* 3.4* 3.4* 3.8  CL 106 106 108 104 105  CO2 20* 21* 19* 21* 21*  GLUCOSE 95 95 137* 97 98  BUN 15 11 10 11 14   CREATININE 0.47 0.49 0.49 0.41* 0.52  CALCIUM 7.6* 6.9* 7.4* 7.6* 7.1*  MG 2.0 1.8 1.8 1.5* 2.1  PHOS 1.5* 2.5 1.8* 1.9* 3.1    GFR: Estimated Creatinine Clearance: 56.8 mL/min (by C-G formula based on SCr of 0.52 mg/dL). Liver Function Tests: Recent Labs  Lab 09/01/22 0531 09/02/22 0528 09/03/22 0515 09/04/22 0542 09/05/22 0555  AST 15 23 21 22 23   ALT 35 38 33 31 30  ALKPHOS 97 112 145* 232* 295*  BILITOT 0.5 0.3 0.4 0.3 0.3  PROT 5.6* 5.2* 4.8* 4.8* 4.9*  ALBUMIN 2.8* 2.8* 2.6* 2.5* 2.7*    No results for input(s): "LIPASE", "AMYLASE" in the last 168 hours. No results for input(s): "AMMONIA" in  the last 168 hours. Coagulation Profile: No results for input(s): "INR", "PROTIME" in the last 168 hours. Cardiac Enzymes: No results for input(s): "CKTOTAL", "CKMB", "CKMBINDEX", "TROPONINI" in the last 168 hours. BNP (last 3 results) No results for input(s): "PROBNP" in the last 8760 hours. HbA1C: No results for input(s): "HGBA1C" in the last 72 hours. CBG: No results for input(s): "GLUCAP" in the last 168 hours. Lipid Profile: No results for input(s): "CHOL", "HDL", "LDLCALC", "TRIG", "CHOLHDL", "LDLDIRECT" in the last 72 hours.  Thyroid Function Tests: No results for input(s): "TSH", "  T4TOTAL", "FREET4", "T3FREE", "THYROIDAB" in the last 72 hours. Anemia Panel: No results for input(s): "VITAMINB12", "FOLATE", "FERRITIN", "TIBC", "IRON", "RETICCTPCT" in the last 72 hours.  Sepsis Labs: No results for input(s): "PROCALCITON", "LATICACIDVEN" in the last 168 hours.  No results found for this or any previous visit (from the past 240 hour(s)).        Radiology Studies: No results found.      Scheduled Meds:  aspirin EC  81 mg Oral Daily   Chlorhexidine Gluconate Cloth  6 each Topical Daily   clopidogrel  75 mg Oral Daily   dabrafenib mesylate  150 mg Oral Q12H   dexamethasone  2 mg Oral Daily   dextromethorphan-guaiFENesin  1 tablet Oral BID   diclofenac Sodium  4 g Topical QID   enoxaparin (LOVENOX) injection  40 mg Subcutaneous Q24H   feeding supplement  1 Container Oral TID BM   fentaNYL  1 patch Transdermal [id number]   folic acid  1 mg Oral Daily   LORazepam  2 mg Sublingual TID AC & HS   megestrol  400 mg Oral BID   OLANZapine zydis  10 mg Oral Daily   OLANZapine zydis  5 mg Oral QHS   ondansetron  8 mg Oral Q12H   pantoprazole (PROTONIX) IV  40 mg Intravenous Q12H   senna  1 tablet Oral QHS   sertraline  100 mg Oral BID   trametinib dimethyl sulfoxide  2 mg Oral Daily   Continuous Infusions:  sodium chloride 75 mL/hr at 09/02/22 0536     LOS: 14 days     Time spent:40 min    Allea Kassner, Geraldo Docker, MD [facility]   If 7PM-7AM, please contact night-coverage 09/05/2022, 12:49 PM

## 2022-09-06 DIAGNOSIS — R112 Nausea with vomiting, unspecified: Secondary | ICD-10-CM | POA: Diagnosis not present

## 2022-09-06 DIAGNOSIS — J90 Pleural effusion, not elsewhere classified: Secondary | ICD-10-CM | POA: Diagnosis not present

## 2022-09-06 DIAGNOSIS — F411 Generalized anxiety disorder: Secondary | ICD-10-CM | POA: Diagnosis not present

## 2022-09-06 DIAGNOSIS — R630 Anorexia: Secondary | ICD-10-CM | POA: Diagnosis not present

## 2022-09-06 LAB — COMPREHENSIVE METABOLIC PANEL
ALT: 33 U/L (ref 0–44)
AST: 24 U/L (ref 15–41)
Albumin: 2.5 g/dL — ABNORMAL LOW (ref 3.5–5.0)
Alkaline Phosphatase: 339 U/L — ABNORMAL HIGH (ref 38–126)
Anion gap: 5 (ref 5–15)
BUN: 12 mg/dL (ref 6–20)
CO2: 21 mmol/L — ABNORMAL LOW (ref 22–32)
Calcium: 7.3 mg/dL — ABNORMAL LOW (ref 8.9–10.3)
Chloride: 106 mmol/L (ref 98–111)
Creatinine, Ser: 0.53 mg/dL (ref 0.44–1.00)
GFR, Estimated: >60 mL/min (ref >60)
Glucose, Bld: 91 mg/dL (ref 70–99)
Potassium: 3.7 mmol/L (ref 3.5–5.1)
Sodium: 132 mmol/L — ABNORMAL LOW (ref 135–145)
Total Bilirubin: 0.4 mg/dL (ref 0.3–1.2)
Total Protein: 5 g/dL — ABNORMAL LOW (ref 6.5–8.1)

## 2022-09-06 LAB — CBC WITH DIFFERENTIAL/PLATELET
Abs Immature Granulocytes: 0.05 10*3/uL (ref 0.00–0.07)
Basophils Absolute: 0 10*3/uL (ref 0.0–0.1)
Basophils Relative: 0 %
Eosinophils Absolute: 0.1 10*3/uL (ref 0.0–0.5)
Eosinophils Relative: 2 %
HCT: 30.1 % — ABNORMAL LOW (ref 36.0–46.0)
Hemoglobin: 10 g/dL — ABNORMAL LOW (ref 12.0–15.0)
Immature Granulocytes: 1 %
Lymphocytes Relative: 37 %
Lymphs Abs: 2.1 10*3/uL (ref 0.7–4.0)
MCH: 31 pg (ref 26.0–34.0)
MCHC: 33.2 g/dL (ref 30.0–36.0)
MCV: 93.2 fL (ref 80.0–100.0)
Monocytes Absolute: 0.3 10*3/uL (ref 0.1–1.0)
Monocytes Relative: 5 %
Neutro Abs: 3.2 10*3/uL (ref 1.7–7.7)
Neutrophils Relative %: 55 %
Platelets: 484 10*3/uL — ABNORMAL HIGH (ref 150–400)
RBC: 3.23 MIL/uL — ABNORMAL LOW (ref 3.87–5.11)
RDW: 14.2 % (ref 11.5–15.5)
WBC: 5.8 10*3/uL (ref 4.0–10.5)
nRBC: 0 % (ref 0.0–0.2)

## 2022-09-06 LAB — MAGNESIUM: Magnesium: 1.7 mg/dL (ref 1.7–2.4)

## 2022-09-06 LAB — PHOSPHORUS: Phosphorus: 2.5 mg/dL (ref 2.5–4.6)

## 2022-09-06 MED ORDER — SENNA 8.6 MG PO TABS: 1.0000 | ORAL_TABLET | Freq: Every day | ORAL | 0 refills | Status: DC

## 2022-09-06 MED ORDER — CALCIUM CARBONATE ANTACID 420 MG PO CHEW
420.0000 mg | CHEWABLE_TABLET | Freq: Every day | ORAL | Status: DC
  Filled 2022-09-06: qty 1

## 2022-09-06 MED ORDER — POTASSIUM CHLORIDE CRYS ER 20 MEQ PO TBCR: 40.0000 meq | EXTENDED_RELEASE_TABLET | Freq: Every day | ORAL | 0 refills | Status: DC

## 2022-09-06 MED ORDER — DM-GUAIFENESIN ER 30-600 MG PO TB12: 1.0000 | ORAL_TABLET | Freq: Two times a day (BID) | ORAL | 0 refills | Status: DC

## 2022-09-06 MED ORDER — ACETAMINOPHEN 325 MG PO TABS: 650.0000 mg | ORAL_TABLET | Freq: Four times a day (QID) | ORAL | 0 refills | Status: DC | PRN

## 2022-09-06 MED ORDER — OLANZAPINE 10 MG PO TBDP: 10.0000 mg | ORAL_TABLET | Freq: Every day | ORAL | 0 refills | Status: DC

## 2022-09-06 MED ORDER — PANTOPRAZOLE SODIUM 40 MG PO TBEC: 40.0000 mg | DELAYED_RELEASE_TABLET | Freq: Two times a day (BID) | ORAL | 0 refills | Status: DC

## 2022-09-06 MED ORDER — MAGNESIUM OXIDE -MG SUPPLEMENT 400 (240 MG) MG PO TABS: 400.0000 mg | ORAL_TABLET | Freq: Every day | ORAL | 0 refills | Status: DC

## 2022-09-06 MED ORDER — SULFAMETHOXAZOLE-TRIMETHOPRIM 800-160 MG PO TABS: 1.0000 | ORAL_TABLET | Freq: Two times a day (BID) | ORAL | 0 refills | Status: DC

## 2022-09-06 MED ORDER — MAGNESIUM OXIDE -MG SUPPLEMENT 400 (240 MG) MG PO TABS
400.0000 mg | ORAL_TABLET | Freq: Every day | ORAL | Status: DC
  Administered 2022-09-06 – 2022-09-07 (×2): 400 mg via ORAL
  Filled 2022-09-06 (×2): qty 1

## 2022-09-06 MED ORDER — POTASSIUM CHLORIDE CRYS ER 20 MEQ PO TBCR
40.0000 meq | EXTENDED_RELEASE_TABLET | Freq: Every day | ORAL | Status: DC
  Administered 2022-09-06 – 2022-09-07 (×2): 40 meq via ORAL
  Filled 2022-09-06 (×2): qty 2

## 2022-09-06 MED ORDER — CALCIUM CARBONATE ANTACID 420 MG PO CHEW: 420.0000 mg | CHEWABLE_TABLET | Freq: Every day | ORAL | 0 refills | Status: DC

## 2022-09-06 MED ORDER — MEGESTROL ACETATE 400 MG/10ML PO SUSP: 400.0000 mg | Freq: Two times a day (BID) | ORAL | 0 refills | Status: DC

## 2022-09-06 MED ORDER — FENTANYL 25 MCG/HR TD PT72: 1.0000 | MEDICATED_PATCH | TRANSDERMAL | 0 refills | Status: DC

## 2022-09-06 MED ORDER — LORAZEPAM 0.5 MG PO TABS: 0.5000 mg | ORAL_TABLET | Freq: Four times a day (QID) | ORAL | 0 refills | Status: DC | PRN

## 2022-09-06 MED ORDER — DIPHENHYDRAMINE HCL 50 MG PO CAPS: 50.0000 mg | ORAL_CAPSULE | Freq: Every evening | ORAL | 0 refills | Status: DC | PRN

## 2022-09-06 MED ORDER — OLANZAPINE 5 MG PO TBDP: 5.0000 mg | ORAL_TABLET | Freq: Every day | ORAL | 0 refills | Status: DC

## 2022-09-06 MED ORDER — SULFAMETHOXAZOLE-TRIMETHOPRIM 800-160 MG PO TABS
1.0000 | ORAL_TABLET | Freq: Two times a day (BID) | ORAL | Status: DC
  Administered 2022-09-06 – 2022-09-07 (×3): 1 via ORAL
  Filled 2022-09-06 (×3): qty 1

## 2022-09-06 NOTE — Plan of Care (Signed)

## 2022-09-06 NOTE — Discharge Summary (Signed)
Physician Discharge Summary  Archer Pajares [id number] DOB: 03-26-1966 DOA: 08/21/2022  PCP: Starlyn Skeans, MD  Admit date: 08/21/2022 Discharge date: 09/07/2022  Time spent: 30 minutes  Recommendations for Outpatient Follow-up:    Metastatic non-small cell lung cancer -On chemotherapy -Seen by Dr. Sullivan Lone Oncology evaluated the patient during this hospitalization.  Outpatient follow-up with oncology. -Overall prognosis is guarded to poor.   -3/12 per Dr. Sullivan Lone oncology started on Dabrafenib plus Trametinib towards treatment of her metastatic BRAF mutated lung adenocarcinoma.  -3/12per Dr. Sullivan Lone oncology dexamethasone 2 mg daily to suppress medication related fever oncology to wean off as outpatient. -3/13 palliative care evaluated continue current treatment.  Full code.  Patient would be a good hospice candidate however per palliative care TBD.   -3/14Per Oncology             - have to get more active and be able to nutritionally sustain herself to benefit from palliative treatments. -Follow-up with oncology Dr. Dillon Bjork on 3/20 -follow up with RN Lexine Baton at [facility] for palliative medicine    Right-sided pleural effusion  -S/p Thoracentesis with resultant right-sided hydropneumothorax -3/5 s/p.  Pigtail catheter placed by IR  -3/9 PCCM removed pigtail catheter  -Per PCCM follow-up as outpatient upon discharge.  Signed off 3/11 -3/15 PCXR; recurrence of malignant pleural effusion -3/15 in a.m. will convene multidisciplinary rounds consisting of  Dr. Sullivan Lone oncology, and PCCM.  Discussed placement of Pleurx catheter -3/16 See metastatic lung cancer.  No Pleurx cath -3/16 discussed case with [facility] PCCM regarding the fact that RIGHT metastatic pleural effusion is beginning to reaccumulate.  Will see patient and discuss placement of Pleurx cath vs frequent thoracentesis.  Do not like to place Pleurx cath in malignant pleural effusions.  Will await  recommendations.  Also included Dr. Sullivan Lone oncology in chat. -3/16 ADDENDUM;Dr.Adewale Olalere PCCM concurred that placing a pigtail catheter at this time would not be appropriate.  Thoracentesis for significant reaccumulation (expect by next 7 to 10 days will require additional thoracentesis) -Per PCCM  -Can do thoracentesis PRN if patient benefits symptomatically.   Alternative would be pleurX but she would need someone to drain at home or [employer] would need to approve skilled nursing care at home to do this.   She has an appt with Dr. Verlee Monte on 09/20/22 to discuss further.  Please reach out if any questions or concerns (securechat preferred).     Intractable nausea and vomiting -Most likely secondary to chemotherapy - Compazine 10 mg IV PRN.  Will discuss with patient will have to find some PO regimen to control her N/V - Lorazepam 2 mg TID -Emend  150 mg+ dexamethasone 12 mg x 2 more days - Zofran IV 8 mg BID -3/14 resolved   Severe cancer pain  - 3/13 pain controlled today per patient - 3/14 increase Fentanyl patch 25 mcg   Major depression -Zyprexa 10 mg BID   Stress incontinence vs UTI - 3/17 urine culture pending - 3/17 if negative begin medication for stress incontinence -Urine culture not drawn until 3/18, therefore will not start medication for stress incontinence.  Given symptoms Bactrim DS x 3 days.  Patient will see oncology on 3/20   Leukocytosis - Resolved   Hyponatremia - Poor oral intake, most likely component of dehydration - Normal saline 61ml/hr   Hypokalemia - Potassium goal >4 - 3/15 K-Dur 60 mEq -3/15 recheck potassium @1600  -3/16 K-Phos 30 mmol + K-Dur 50 mEq -  K-Dur 40 mEq daily, PCP/oncology to adjust medication   Hypocalcemia - Calcium goal> 8.9 - 3/15 corrected calcium= 8.0 - 3/15 Calcium Gluconate 3 g -3/16 corrected calcium= 8.8 -3/16 = calcium gluconate 2 g -3/17= corrected calcium= 8.1 - 3/17 calcium gluconate 4 g -Calcium carbonate  420 mg daily   Hypophosphatemia -Phosphorus goal> 2.5 - 3/14 sodium phosphate 30 mmol -3/16 see hypokalemia -Resolved   Hypomagnesmia - Magnesium goal> 2 - 3/16 Magnesium IV 4 g -Magnesium oxide 400 mg daily   Hx ischemic stroke in 07/2022 --ASA 81 mg daily - Plavix 75 mg daily   HLD - 3/13 lipid panel pending -Patient currently not on statin will start Lipitor in a.m.   Anemia of chronic disease? -Transfuse for hemoglobin<7 -3/13 anemia panel pending   Severe protein calorie malnutrition - 3/14 patient informed family can bring any food into hospital for her that she will eat. - 3/14 Megace 400 mg BID      Discharge Diagnoses:  Principal Problem:   Pleural effusion on right Active Problems:   Intractable nausea and vomiting   Primary adenocarcinoma of lung (HCC)   Cancer associated pain   Major depression, recurrent, chronic (HCC)   History of ischemic stroke   Goals of care, counseling/discussion   Malignant pleural effusion   Protein-calorie malnutrition, severe ([place])   Discharge Condition: Stable  Diet recommendation: Regular  Filed Weights   08/21/22 1919 08/22/22 0146  Weight: 46.3 kg 45.8 kg    History of present illness:  57 year old HF PMHx primary language Spanish (understands significant amount of English) non-small cell lung cancer on chemotherapy, chronic back pain and essential HTN, who underwent first round of induction chemotherapy with carboplatin and Alimta on 08/18/22 and since then started having intractable nausea, vomiting chest pain and shortness of breath and unable to tolerate oral medications and diet    Admitted. She was found to have sided pleural effusion which was drained resulting in pneumothorax which was managed by pigtail catheter placement on 08/24/2022 by IR. Pulmonary and palliative care were consulted. Pigtail catheter removed on 08/28/2022.     Hospital Course:  See above   Consultations: Oncology [facility] PCCM    Discharge Exam: Vitals:   09/06/22 0531 09/06/22 1255 09/06/22 2046 09/07/22 0506  BP: 109/78 120/78 122/76 118/65  Pulse: 100 (!) 105 94 97  Resp: 18 16  17   Temp: 98.2 F (36.8 C) 97.8 F (36.6 C) 98.5 F (36.9 C) 97.7 F (36.5 C)  TempSrc: Oral Oral Oral Oral  SpO2: 99% 97% 98% 98%  Weight:      Height:        General: A/O x 4, No acute respiratory distress, cachectic Eyes: negative scleral hemorrhage, negative anisocoria, negative icterus ENT: Negative Runny nose, negative gingival bleeding, Neck:  Negative scars, masses, torticollis, lymphadenopathy, JVD Lungs: Clear to auscultation bilaterally without wheezes or crackles Cardiovascular: Regular rate and rhythm without murmur gallop or rub normal S1 and S2  Discharge Instructions  Discharge Instructions     Amb Referral to Palliative Care   Complete by: As directed       Allergies as of 09/07/2022       Reactions   Lidocaine Itching   3/5 -  patient received 1 % Lidocaine for thoracentesis on 3/3 and had no reaction. S Hunter RN   Percocet [oxycodone-acetaminophen]    unknown   Hydromorphone Hcl Itching   Oxycodone-acetaminophen Itching   Patient can tolerate acetaminophen  Medication List     STOP taking these medications    clonazePAM 1 MG tablet Commonly known as: KLONOPIN   ergocalciferol 1.25 MG (50000 UT) capsule Commonly known as: VITAMIN D2   rosuvastatin 10 MG tablet Commonly known as: CRESTOR       TAKE these medications    acetaminophen 325 MG tablet Commonly known as: TYLENOL Take 2 tablets (650 mg total) by mouth every 6 (six) hours as needed for mild pain (or Fever >/= 101).   albuterol 108 (90 Base) MCG/ACT inhaler Commonly known as: VENTOLIN HFA Inhale 2 puffs into the lungs every 6 (six) hours as needed for wheezing or shortness of breath.   aspirin EC 81 MG tablet Take 1 tablet (81 mg total) by mouth daily. Swallow whole.    calcium carbonate 420 MG Chew chewable tablet Commonly known as: TITRALAC Chew 1 tablet (420 mg total) by mouth daily.   clopidogrel 75 MG tablet Commonly known as: PLAVIX Take 1 tablet (75 mg total) by mouth daily.   dexamethasone 2 MG tablet Commonly known as: DECADRON Tome 1 tableta (2 mg en total) por va oral diariamente. (Take 1 tablet (2 mg) by mouth daily.)   dextromethorphan-guaiFENesin 30-600 MG 12hr tablet Commonly known as: MUCINEX DM Take 1 tablet by mouth 2 (two) times daily.   diclofenac Sodium 1 % Gel Commonly known as: Voltaren Apply 4 g topically 4 (four) times daily. What changed:  when to take this reasons to take this   diphenhydrAMINE 50 MG capsule Commonly known as: BENADRYL Take 1 capsule (50 mg total) by mouth at bedtime as needed for sleep.   fentaNYL 25 MCG/HR Commonly known as: [place] 1 patch onto the skin every 3 (three) days. Start taking on: March 20, 123456   folic acid 1 MG tablet Commonly known as: FOLVITE Take 1 tablet (1 mg total) by mouth daily. Start 7 days before pemetrexed chemotherapy. Continue until 21 days after pemetrexed completed.   HYDROcodone-acetaminophen 5-325 MG tablet Commonly known as: NORCO/VICODIN Take 1-2 tablets by mouth every 6 (six) hours as needed for moderate pain or severe pain. Do not drive after taking   lidocaine 2 % solution Commonly known as: XYLOCAINE Use as directed 15 mLs in the mouth or throat every 4 (four) hours as needed for mouth pain (sore throat).   lidocaine-prilocaine cream Commonly known as: EMLA Apply to affected area once What changed:  how much to take how to take this when to take this reasons to take this additional instructions   LORazepam 0.5 MG tablet Commonly known as: ATIVAN Take 1 tablet (0.5 mg total) by mouth every 6 (six) hours as needed for anxiety.   magnesium oxide 400 (240 Mg) MG tablet Commonly known as: MAG-OX Take 1 tablet (400 mg total) by mouth  daily.   megestrol 400 MG/10ML suspension Commonly known as: MEGACE Take 10 mLs (400 mg total) by mouth 2 (two) times daily.   [place] 2 MG tablet Generic drug: trametinib dimethyl sulfoxide Take 1 tablet (2 mg total) by mouth daily. Take 1 hour before or 2 hours after a meal. Store refrigerated in original container.   OLANZapine zydis 5 MG disintegrating tablet Commonly known as: ZYPREXA Take 1 tablet (5 mg total) by mouth at bedtime.   OLANZapine zydis 10 MG disintegrating tablet Commonly known as: ZYPREXA Take 1 tablet (10 mg total) by mouth daily.   ondansetron 8 MG disintegrating tablet Commonly known as: ZOFRAN-ODT Take 1 tablet (8 mg  total) by mouth every 8 (eight) hours as needed for nausea or vomiting.   pantoprazole 40 MG tablet Commonly known as: Protonix Take 1 tablet (40 mg total) by mouth 2 (two) times daily.   potassium chloride SA 20 MEQ tablet Commonly known as: KLOR-CON M Take 2 tablets (40 mEq total) by mouth daily.   prochlorperazine 10 MG tablet Commonly known as: COMPAZINE Take 1 tablet (10 mg total) by mouth every 6 (six) hours as needed for refractory nausea / vomiting.   senna 8.6 MG Tabs tablet Commonly known as: SENOKOT Take 1 tablet (8.6 mg total) by mouth at bedtime.   sertraline 100 MG tablet Commonly known as: ZOLOFT Take 100 mg by mouth 2 (two) times daily.   sulfamethoxazole-trimethoprim 800-160 MG tablet Commonly known as: BACTRIM DS Take 1 tablet by mouth every 12 (twelve) hours.   Tafinlar 75 MG capsule Generic drug: dabrafenib mesylate Take 2 capsules (150 mg total) by mouth 2 (two) times daily. Take on an empty stomach 1 hour before or 2 hours after meals.   traZODone 150 MG tablet Commonly known as: DESYREL Take 1 tablet (150 mg total) by mouth at bedtime as needed for sleep. What changed: when to take this   trimethobenzamide 300 MG capsule Commonly known as: Tigan Take 1 capsule (300 mg total) by mouth 3 (three) times  daily as needed for nausea/vomiting. Tome 1 capsula (300 mg en total) por via oral 3 (tres) veces al dia segun sea necesario para las nauseas/vomitos.               Durable Medical Equipment  (From admission, onward)           Start     Ordered   09/07/22 1019  For home use only DME Walker rolling  Once       Question Answer Comment  Walker: With 5 Inch Wheels   Patient needs a walker to treat with the following condition Weakness      09/07/22 1019           Allergies  Allergen Reactions   Lidocaine Itching    3/5 -  patient received 1 % Lidocaine for thoracentesis on 3/3 and had no reaction. S Hunter RN   Percocet [Oxycodone-Acetaminophen]     unknown   Hydromorphone Hcl Itching   Oxycodone-Acetaminophen Itching    Patient can tolerate acetaminophen      The results of significant diagnostics from this hospitalization (including imaging, microbiology, ancillary and laboratory) are listed below for reference.    Significant Diagnostic Studies: DG CHEST PORT 1 VIEW  Result Date: 09/03/2022 CLINICAL DATA:  Pleural effusion EXAM: PORTABLE CHEST 1 VIEW COMPARISON:  08/29/2022 FINDINGS: There is a right chest wall port a catheter with tip at the superior cavoatrial junction. Stable cardiac enlargement. Small right pleural effusion appears increased from previous exam. Right perihilar lung mass with postobstructive changes in the right upper lobe and right middle lobe are again noted. Similar appearance of left upper lobe and superior segment of left lower lobe perihilar opacities. IMPRESSION: 1. Increase in small right pleural effusion. 2. No change in right perihilar lung mass with postobstructive changes in the right upper lobe and right middle lobe. 3. Left lung opacities are unchanged. Electronically Signed   By: Kerby Moors M.D.   On: 09/03/2022 07:02   DG Abd 1 View  Result Date: 08/31/2022 CLINICAL DATA:  [id number] Intractable nausea and vomiting [id number] EXAM:  ABDOMEN - 1 VIEW COMPARISON:  CT 01/27/2023 FINDINGS: Nonobstructive bowel gas pattern. Minimal stool burden. No radiopaque calculi overlie the kidneys. There is calcification overlying the left upper quadrant corresponding to a calcified splenic granuloma. No acute osseous abnormality. Degenerative changes of the spine prominent at L4-L5 and L5-S1. Sclerotic osseous metastatic lesion in the right ischial tuberosity and additional osseous lesions better seen on prior PET-CT. IMPRESSION: No evidence of bowel obstruction. Electronically Signed   By: Maurine Simmering M.D.   On: 08/31/2022 12:17   DG CHEST PORT 1 VIEW  Result Date: 08/29/2022 CLINICAL DATA:  57 year old female history of right-sided pleural effusion. EXAM: PORTABLE CHEST 1 VIEW COMPARISON:  Chest x-ray 08/28/2022. FINDINGS: Right internal jugular single-lumen power porta cath with tip terminating at the superior cavoatrial junction. Previously noted pigtail drainage catheter has been removed. Diffuse interstitial prominence and peribronchial cuffing with widespread but patchy asymmetrically distributed airspace consolidation in the lungs bilaterally (right greater than left), most evident in the right mid to upper lung, similar to the prior study. No layering pleural effusion (although some fluid in the right major fissure may be present). No pneumothorax. No evidence of pulmonary edema. Heart size is normal. IMPRESSION: 1. Support apparatus, as above. 2. There may be some residual fluid in the right major fissure, but no layering pleural effusion is identified at this time. 3. 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 1. Electronically Signed   By: Vinnie Langton M.D.   On: 08/29/2022 08:28   DG CHEST PORT 1 VIEW  Result Date: 08/28/2022 CLINICAL DATA:  Right-sided pleural effusion EXAM: PORTABLE CHEST 1 VIEW COMPARISON:  Prior chest x-ray 08/27/2022 FINDINGS: Right IJ approach  single-lumen power injectable port catheter remains in good position with the tip overlying the cavoatrial junction. Right-sided pigtail thoracostomy tube in unchanged position. Interval resolution of pneumothorax. Persistent small right-sided pleural effusion. Stable appearance of the lungs with right suprahilar mass, interstitial spread of disease throughout the right lung and left nodular airspace opacity. IMPRESSION: 1. Interval resolution of pneumothorax. 2. Small right-sided pleural effusion. 3. Stable support apparatus. 4. Stable known right upper lobe mass. Electronically Signed   By: Jacqulynn Cadet M.D.   On: 08/28/2022 08:43   DG CHEST PORT 1 VIEW  Result Date: 08/27/2022 CLINICAL DATA:  Pneumothorax on right side. EXAM: PORTABLE CHEST 1 VIEW COMPARISON:  Radiograph earlier today., Additional radiographs reviewed. CT 08/21/2022 FINDINGS: Right-sided pneumothorax is unchanged in size paralleling the posterior third rib, 17 mm from the under surface of the first rib. Pigtail catheter remains in place. Accessed chest port is again seen. Stable heart size and mediastinal contours with unchanged right suprahilar masslike opacity. Interstitial opacities diffusely are stable. No new abnormalities. IMPRESSION: 1. Unchanged size of small right pneumothorax. Right pigtail catheter remains in place. 2. The exam is otherwise unchanged. Electronically Signed   By: Keith Rake M.D.   On: 08/27/2022 16:31   DG CHEST PORT 1 VIEW  Result Date: 08/27/2022 CLINICAL DATA:  Follow up pneumothorax.  Right chest tube clamping. EXAM: PORTABLE CHEST 1 VIEW COMPARISON:  Radiographs earlier the same date, 08/26/2022 and 08/25/2022. FINDINGS: 1025 hours. Right IJ Port-A-Cath extends to the superior cavoatrial junction. Pigtail chest tube peripherally in the right pleural space is unchanged. Small right apical pneumothorax is unchanged. The heart size and mediastinal contours are stable with known right paratracheal and  bilateral hilar adenopathy. Unchanged right greater than left interstitial and airspace opacities in both lungs. No significant  pleural effusion. No acute osseous findings. There is a small amount of soft tissue emphysema within the right lateral chest wall. IMPRESSION: 1. Stable small right apical pneumothorax following chest tube clamping. No significant change in bilateral interstitial and airspace opacities. 2. Stable mediastinal and hilar adenopathy. Electronically Signed   By: Richardean Sale M.D.   On: 08/27/2022 11:09   DG CHEST PORT 1 VIEW  Result Date: 08/27/2022 CLINICAL DATA:  Pneumothorax on right EXAM: PORTABLE CHEST 1 VIEW COMPARISON:  Radiograph 08/26/2022 FINDINGS: Chest port catheter tip overlies the distal SVC/superior cavoatrial junction. Unchanged cardiomediastinal silhouette. There are diffuse interstitial opacities with right greater than left airspace disease. There is a small right apical pneumothorax which is decreased from prior exam, measuring up to 1.6 cm in the apex, previously 2.5 cm. Stable right peripheral pigtail chest tube. Bones are unchanged. IMPRESSION: Decreased, small right apical pneumothorax with chest tube in place. Unchanged bilateral interstitial and airspace disease, right greater than left. Electronically Signed   By: Maurine Simmering M.D.   On: 08/27/2022 08:17   DG CHEST PORT 1 VIEW  Result Date: 08/26/2022 CLINICAL DATA:  Right pleural effusion. EXAM: PORTABLE CHEST 1 VIEW COMPARISON:  Chest radiographs 08/26/2022 at 4:54 a.m., 08/25/2022, 08/24/2022; CT chest 08/21/2022 FINDINGS: Right chest wall porta catheter tip again overlies the central superior vena cava. A right pigtail chest tube tip is again coronal overlying the lateral mid height of the right hemithorax, not simply changed. No significant change in small right apical pneumothorax measuring approximately 1.5 cm in craniocaudal height at the right lung apex. No significant change in right perihilar MR mild  left midlung heterogeneous airspace opacities. Right superior perihilar fullness is again seen consistent with the lymphadenopathy seen on prior CT. No pleural effusion is seen. No acute skeletal abnormality. IMPRESSION: 1. No significant change in small right apical pneumothorax. Right pigtail chest tube tip overlies the lateral mid height of the right hemithorax, unchanged on frontal view. 2. No significant change in right perihilar and left midlung heterogeneous airspace opacities. Electronically Signed   By: Yvonne Kendall M.D.   On: 08/26/2022 13:07   DG Chest Port 1 View  Result Date: 08/26/2022 CLINICAL DATA:  Follow up pneumothorax EXAM: PORTABLE CHEST 1 VIEW COMPARISON:  08/25/2022 FINDINGS: Right apical pneumothorax status diminished slightly compared to the prior study with about 1.5 cm pleural separation at the apex. Right mid thorax chest tube in place. Diffuse alveolar and interstitial opacities consistent with pulmonary edema or bilateral pneumonia. No pleural effusions identified. Right-sided Port-A-Cath tip distal SVC. IMPRESSION: Persistent, slightly decreased right-sided pneumothorax with a right chest tube in place. Diffuse interstitial and alveolar opacities. Electronically Signed   By: Sammie Bench M.D.   On: 08/26/2022 07:50   CT [facility] PLEURAL DRAIN W/INDWELL CATH W/IMG GUIDE  Result Date: 08/25/2022 INDICATION: History of right-sided lung cancer, now with enlarging, symptomatic right-sided hydropneumothorax following recent thoracentesis. Please perform image guided placement of right-sided chest tube. EXAM: CT PERC PLEURAL DRAIN W/INDWELL CATH W/IMG GUIDE COMPARISON:  Chest radiograph-earlier same day; 08/23/2022; 08/22/2022; 08/21/2022 Ultrasound-guided right-sided thoracentesis-08/22/2022 Chest CT-08/21/2022 PET-CT-08/18/2022 MEDICATIONS: The patient is currently admitted to the hospital and receiving intravenous antibiotics. The antibiotics were administered within an appropriate  time frame prior to the initiation of the procedure. ANESTHESIA/SEDATION: Moderate (conscious) sedation was employed during this procedure. A total of Versed 2 mg and Fentanyl 100 mcg was administered intravenously. Moderate Sedation Time: 21 minutes. The patient's level of consciousness and vital signs were monitored continuously  by radiology nursing throughout the procedure under my direct supervision. CONTRAST:  None COMPLICATIONS: None immediate. PROCEDURE: RADIATION DOSE REDUCTION: This exam was performed according to the departmental dose-optimization program which includes automated exposure control, adjustment of the mA and/or kV according to patient size and/or use of iterative reconstruction technique. Informed written consent was obtained from the patient after a discussion of the risks, benefits and alternatives to treatment. The patient was placed supine on the CT gantry and a pre procedural CT was performed re-demonstrating the known moderate-sized right-sided hydropneumothorax. The procedure was planned utilizing an anterolateral approach with attention made to avoid the port a catheter reservoir site. A timeout was performed prior to the initiation of the procedure. The skin overlying the right upper chest was prepped and draped in the usual sterile fashion. The overlying soft tissues were anesthetized with 1% lidocaine with epinephrine. Next, an 75 gauge trocar needle was utilized to access the superolateral aspect of the right pleural space. Appropriate position was confirmed with the aspiration of air. Next, a short Amplatz wire was coiled within the apical aspect of the right pleural space. Appropriate position was confirmed with CT imaging (series 6). Next, the tract was serially dilated allowing placement of a 10 [place] all-purpose drainage catheter. Appropriate positioning was confirmed with a limited postprocedural CT scan. Next, air and approximately 250 cc of serous fluid was aspirated. The  tube was connected to a pleura vac device and sutured in place. A dressing was applied. The patient tolerated the procedure well without immediate post procedural complication. IMPRESSION: Successful CT guided placement of a 10 [place] all purpose drain catheter into the apical aspects of right-sided hydropneumothorax with aspiration of air and approximately 250 cc of serous fluid. Electronically Signed   By: Sandi Mariscal M.D.   On: 08/25/2022 09:45   DG CHEST PORT 1 VIEW  Result Date: 08/25/2022 CLINICAL DATA:  Right pneumothorax. Mediastinal masses, no malignancy. EXAM: PORTABLE CHEST 1 VIEW COMPARISON:  Chest radiographs 08/24/2022 at 12:25 p.m., 08/23/2022, 08/22/2022; CT chest 08/21/2022; CT-guided percutaneous chest tube placement 08/24/2022 at 3 p.m. FINDINGS: The patient underwent CT-guided percutaneous chest tube placement on the right 08/24/2022 at 3 p.m. Compared to 08/24/2022 at 12:25 p.m. chest radiograph, there is a new right-sided chest tube with pigtail overlying the lateral right hemithorax at mid height. Interval decrease in right pneumothorax, with the extrapleural air space measuring approximately 2.3 cm in craniocaudal dimension at the superior right hemithorax compared to 5.3 cm on 08/24/2022 prior to the chest tube placement. The prior layering right basilar air-fluid level is no longer visualized. No left pneumothorax. There are patchy heterogeneous airspace opacities throughout the right mid to lower lung and left perihilar region, similar to prior. Cardiac silhouette is normal in size. Right greater than left hilar fullness consistent with the mediastinal masses seen on prior CT. Right chest wall porta catheter tip overlies the central superior vena cava/right atrial junction. No acute skeletal abnormality. IMPRESSION: Compared to 08/24/2022 at 12:25 p.m.: 1. New right-sided chest tube with pigtail overlying the lateral right hemithorax at mid height. 2. Interval decrease in right  pneumothorax, with the extrapleural air space measuring approximately 2.3 cm in craniocaudal dimension at the superior right hemithorax compared to 5.3 cm on 08/24/2022 prior to the chest tube placement. Electronically Signed   By: Yvonne Kendall M.D.   On: 08/25/2022 08:35   DG Chest 2 View  Addendum Date: 08/24/2022   ADDENDUM REPORT: 08/24/2022 12:53 ADDENDUM: Findings were discussed with and  acknowledged by Dr. Barb Merino. Electronically Signed   By: Sammie Bench M.D.   On: 08/24/2022 12:53   Result Date: 08/24/2022 CLINICAL DATA:  Follow up pneumothorax EXAM: CHEST - 2 VIEW COMPARISON:  08/23/2022 FINDINGS: Right-sided hydropneumothorax has increased in size and there is no evidence of shift of mediastinal structures to the left. There is alveolar consolidation both lungs consistent with edema. Cardiac silhouette is unremarkable. Right-sided Port-A-Cath tip superimposed with distal SVC. IMPRESSION: Right hydropneumothorax with an interval increase in size and new mediastinal shift. Diffuse alveolar consolidation lungs consistent with edema. Electronically Signed: By: Sammie Bench M.D. On: 08/24/2022 12:42   DG Chest 1 View  Result Date: 08/23/2022 CLINICAL DATA:  Right apical pneumothorax EXAM: CHEST  1 VIEW COMPARISON:  Chest radiograph dated 08/22/2022 FINDINGS: Lines/tubes: Right chest wall port tip projects over the superior cavoatrial junction. Chest: Increased right lung opacification. Minimal patchy and interstitial left mid lung opacities. Pleura: Slightly increased small right apical pneumothorax. Slightly increased trace right pleural effusion. Heart/mediastinum: Similar  cardiomediastinal silhouette. Bones: No acute osseous abnormality. IMPRESSION: 1. Slightly increased small right apical pneumothorax and trace right pleural effusion. 2. Increased right lung opacification, which may represent atelectasis, pneumonia, or aspiration. Electronically Signed   By: Darrin Nipper M.D.   On:  08/23/2022 15:00   US THORACENTESIS ASP PLEURAL SPACE W/IMG GUIDE  Result Date: 08/22/2022 INDICATION: Lung cancer with right pleural effusion. Request for diagnostic and therapeutic thoracentesis. EXAM: ULTRASOUND GUIDED RIGHT THORACENTESIS MEDICATIONS: 1% lidocaine 4 mL COMPLICATIONS: SIR Level A - No therapy, no consequence. PROCEDURE: An ultrasound guided thoracentesis was thoroughly discussed with the patient and questions answered. The benefits, risks, alternatives and complications were also discussed. The patient understands and wishes to proceed with the procedure. Written consent was obtained. Ultrasound was performed to localize and mark an adequate pocket of fluid in the right chest. The area was then prepped and draped in the normal sterile fashion. 1% Lidocaine was used for local anesthesia. Under ultrasound guidance a 6 Fr Safe-T-Centesis catheter was introduced. Thoracentesis was performed. The catheter was removed and a dressing applied. FINDINGS: A total of approximately 420 mL of clear amber fluid was removed. Samples were sent to the laboratory as requested by the clinical team. IMPRESSION: Successful ultrasound guided right thoracentesis yielding 420 mL of pleural fluid. Post-procedure chest x-ray shows small apical pneumothorax most likely consistent with incomplete re-expansion due to known lung carcinoma. We will repeat chest x-ray tomorrow morning to ensure stability. Procedure performed by: Gareth Eagle, PA-C Electronically Signed   By: Aletta Edouard M.D.   On: 08/22/2022 11:06   DG Chest 1 View  Result Date: 08/22/2022 CLINICAL DATA:  Status post right thoracentesis. EXAM: CHEST  1 VIEW COMPARISON:  08/21/2022 FINDINGS: Interval decreased right pleural effusion. Small right apical pneumothorax evident. Right suprahilar and parahilar airspace disease again noted with patchy subtle parahilar opacities in the left lung. The visualized bony structures of the thorax are unremarkable.  Telemetry leads overlie the chest. IMPRESSION: 1. Interval decreased right pleural effusion status post thoracentesis. Small right apical pneumothorax. 2. Right suprahilar and parahilar airspace disease with patchy subtle parahilar opacities in the left lung. These results will be called to the ordering clinician or representative by the Radiologist Assistant, and communication documented in the PACS or [employer]. Electronically Signed   By: Misty Stanley M.D.   On: 08/22/2022 10:28   CT Angio Chest PE W and/or Wo Contrast  Result Date: 08/21/2022 CLINICAL DATA:  Concern for pulmonary embolism. History hands cold post noted it cold EXAM: CT ANGIOGRAPHY CHEST WITH CONTRAST TECHNIQUE: Multidetector CT imaging of the chest was performed using the standard protocol during bolus administration of intravenous contrast. Multiplanar CT image reconstructions and MIPs were obtained to evaluate the vascular anatomy. RADIATION DOSE REDUCTION: This exam was performed according to the departmental dose-optimization program which includes automated exposure control, adjustment of the mA and/or kV according to patient size and/or use of iterative reconstruction technique. CONTRAST:  29mL OMNIPAQUE IOHEXOL 350 MG/ML SOLN COMPARISON:  Chest CT dated 06/25/2022 and radiograph dated 08/21/2022. FINDINGS: Evaluation of this exam is limited due to respiratory motion artifact. Cardiovascular: There is no cardiomegaly small pericardial effusion, new since the prior CT and measuring 1 cm in thickness. The thoracic aorta is unremarkable. The origins of the great vessels of the aortic arch appear patent. Evaluation of the pulmonary arteries is limited due to respiratory motion. No central pulmonary artery embolus identified. Mediastinum/Nodes: There is infiltrative soft tissue the mediastinum and bilateral hila in keeping with known malignancy. No mediastinal fluid collection. Right-sided Port-A-Cath with tip at the cavoatrial  junction. Lungs/Pleura: There is a moderate size right and trace left pleural effusions, new since the prior CT and suspicious for malignant effusion. There is partial compressive atelectasis of the right lower lobe. Patchy area of consolidation in the right perihilar region extending into the right upper lobe and right middle lobes may represent combination of tumor infiltration, atelectasis, or pneumonia. There is however diffuse interstitial thickening and nodularity primarily involving the upper lobes bilaterally as well as in the lower lobes suspicious for lymphangitic carcinomatosis. There is no pneumothorax. There is narrowing of the right upper lobe bronchus as well as narrowing of the right middle and right lower lobe bronchi. The central airways remain patent. Upper Abdomen: Small calcified splenic granuloma. Musculoskeletal: Sclerotic lesions involving T10, T12, and L3 consistent with metastatic disease. No acute osseous pathology. Review of the MIP images confirms the above findings. IMPRESSION: 1. No CT evidence of central pulmonary artery embolus. 2. Moderate size right and trace left pleural effusions, new since the prior CT and suspicious for malignant effusion. 3. Infiltrative mediastinal and hilar mass in keeping with known malignancy. 4. Significant progression of disease with bilateral perihilar consolidation and findings 5. suspicious for lymphangitic carcinomatosis. 6. Sclerotic lesions involving T10, T12, and L3 consistent with metastatic disease. Electronically Signed   By: Anner Crete M.D.   On: 08/21/2022 20:53   DG Chest Port 1 View  Result Date: 08/21/2022 CLINICAL DATA:  Shortness of breath EXAM: PORTABLE CHEST 1 VIEW COMPARISON:  PET-CT dated July 29, 2022 and CT examination dated August 13, 2022 FINDINGS: The heart is normal in size. Pulmonary vascular congestion with bilateral perihilar opacities consistent with patient's known hilar lymphadenopathy. Small bilateral  pleural effusions. Right IJ access MediPort with distal tip in the SVC no acute osseous abnormality IMPRESSION: 1. Pulmonary vascular congestion with bilateral perihilar opacities consistent with patient's known hilar lymphadenopathy. 2. Small bilateral pleural effusions. Electronically Signed   By: Keane Police D.O.   On: 08/21/2022 19:59   VAS [place] LOWER EXTREMITY VENOUS (DVT)  Result Date: 08/17/2022  Lower Venous DVT Study Patient Name:  SHANDREA AHLMAN  Date of Exam:   08/17/2022 Medical Rec #: [id number]               Accession #:    [id number] Date of Birth: 14-Nov-1965  Patient Gender: F Patient Age:   37 years Exam Location:  [facility] Procedure:      VAS [place] LOWER EXTREMITY VENOUS (DVT) Referring Phys: JULIE MACHEN --------------------------------------------------------------------------------  Other Indications: Stroke, primary lung adenocarcinoma. Comparison Study: No prior study. Performing Technologist: McKayla Maag RVT, [place]  Examination Guidelines: A complete evaluation includes B-mode imaging, spectral Doppler, color Doppler, and power Doppler as needed of all accessible portions of each vessel. Bilateral testing is considered an integral part of a complete examination. Limited examinations for reoccurring indications may be performed as noted. The reflux portion of the exam is performed with the patient in reverse Trendelenburg.  +---------+---------------+---------+-----------+----------+--------------+ RIGHT    CompressibilityPhasicitySpontaneityPropertiesThrombus Aging +---------+---------------+---------+-----------+----------+--------------+ CFV      Full           Yes      Yes                                 +---------+---------------+---------+-----------+----------+--------------+ SFJ      Full                                                        +---------+---------------+---------+-----------+----------+--------------+ FV Prox  Full                                                         +---------+---------------+---------+-----------+----------+--------------+ FV Mid   Full                                                        +---------+---------------+---------+-----------+----------+--------------+ FV DistalFull                                                        +---------+---------------+---------+-----------+----------+--------------+ PFV      Full                                                        +---------+---------------+---------+-----------+----------+--------------+ POP      Full           Yes      Yes                                 +---------+---------------+---------+-----------+----------+--------------+ PTV      Full                                                        +---------+---------------+---------+-----------+----------+--------------+  PERO     Full                                                        +---------+---------------+---------+-----------+----------+--------------+   +---------+---------------+---------+-----------+----------+--------------+ LEFT     CompressibilityPhasicitySpontaneityPropertiesThrombus Aging +---------+---------------+---------+-----------+----------+--------------+ CFV      Full           Yes      Yes                                 +---------+---------------+---------+-----------+----------+--------------+ SFJ      Full                                                        +---------+---------------+---------+-----------+----------+--------------+ FV Prox  Full                                                        +---------+---------------+---------+-----------+----------+--------------+ FV Mid   Full                                                        +---------+---------------+---------+-----------+----------+--------------+ FV DistalFull                                                         +---------+---------------+---------+-----------+----------+--------------+ PFV      Full                                                        +---------+---------------+---------+-----------+----------+--------------+ POP      Full           Yes      Yes                                 +---------+---------------+---------+-----------+----------+--------------+ PTV      Full                                                        +---------+---------------+---------+-----------+----------+--------------+ PERO     Full                                                        +---------+---------------+---------+-----------+----------+--------------+  Summary: BILATERAL: - No evidence of deep vein thrombosis seen in the lower extremities, bilaterally. - No evidence of superficial venous thrombosis in the lower extremities, bilaterally. -No evidence of popliteal cyst, bilaterally.   *See table(s) above for measurements and observations. Electronically signed by Deitra Mayo MD on 08/17/2022 at 7:34:49 PM.    Final    IR IMAGING GUIDED PORT INSERTION  Result Date: 08/17/2022 INDICATION: Chemotherapy access EXAM: Chest port placement using ultrasound and fluoroscopic guidance MEDICATIONS: Documented in the EMR ANESTHESIA/SEDATION: Moderate (conscious) sedation was employed during this procedure. A total of Versed 2 mg and Fentanyl 100 mcg was administered intravenously. Moderate Sedation Time: 32 minutes. The patient's level of consciousness and vital signs were monitored continuously by radiology nursing throughout the procedure under my direct supervision. FLUOROSCOPY TIME:  Fluoroscopy Time: 0.1 minutes (1 mGy) COMPLICATIONS: None immediate. PROCEDURE: Informed written consent was obtained from the patient after a thorough discussion of the procedural risks, benefits and alternatives. All questions were addressed. Maximal Sterile Barrier Technique was utilized including  caps, mask, sterile gowns, sterile gloves, sterile drape, hand hygiene and skin antiseptic. A timeout was performed prior to the initiation of the procedure. The patient was placed supine on the exam table. The right neck and chest was prepped and draped in the standard sterile fashion. A preliminary ultrasound of the right neck was performed and demonstrates a patent right internal jugular vein. A permanent ultrasound image was stored in the electronic medical record. The overlying skin was anesthetized with 1% Lidocaine. Using ultrasound guidance, access was obtained into the right internal jugular vein using a 21 gauge micropuncture set. A wire was advanced into the SVC, a short incision was made at the puncture site, and serial dilatation performed. Next, in an ipsilateral infraclavicular location, an incision was made at the site of the subcutaneous reservoir. Blunt dissection was used to open a pocket to contain the reservoir. A subcutaneous tunnel was then created from the port site to the puncture site. A(n) 8 Fr single lumen catheter was advanced through the tunnel. The catheter was attached to the port and this was placed in the subcutaneous pocket. Under fluoroscopic guidance, a peel away sheath was placed, and the catheter was trimmed to the appropriate length and was advanced into the central veins. The catheter length is 23 cm. The tip of the catheter lies near the superior cavoatrial junction. The port flushes and aspirates appropriately. The port was flushed and locked with heparinized saline. The port pocket was closed in 2 layers using 3-0 and 4-0 Vicryl/absorbable suture. Dermabond was also applied to both incisions. The patient tolerated the procedure well and was transferred to recovery in stable condition. IMPRESSION: Successful placement of a right-sided chest port via the right internal jugular vein. The port is ready for immediate use. Electronically Signed   By: Albin Felling M.D.   On:  08/17/2022 16:49   ECHO TEE  Result Date: 08/17/2022    TRANSESOPHOGEAL ECHO REPORT   Patient Name:   LENAE BODOH Date of Exam: 08/17/2022 Medical Rec #:  [id number]              Height:       62.0 in Accession #:    [id number]             Weight:       98.1 lb Date of Birth:  03-20-1966               BSA:  1.412 m Patient Age:    36 years               BP:           119/82 mmHg Patient Gender: F                      HR:           88 bpm. Exam Location:  Inpatient Procedure: Transesophageal Echo, Color Doppler, Cardiac Doppler and Saline            Contrast Bubble Study Indications:     Stroke  History:         Patient has prior history of Echocardiogram examinations, most                  recent 08/14/2022. Risk Factors:Hypertension. Latent TB,                  metastatic lung adenocarcinoma.  Sonographer:     Eartha Inch Referring Phys:  [id number] Margie Billet Diagnosing Phys: Oswaldo Milian MD PROCEDURE: After discussion of the risks and benefits of a TEE, an informed consent was obtained from the patient. The transesophogeal probe was passed without difficulty through the esophogus of the patient. Imaged were obtained with the patient in a left lateral decubitus position. Sedation performed by different physician. The patient was monitored while under deep sedation. Image quality was good. The patient's vital signs; including heart rate, blood pressure, and oxygen saturation; remained stable throughout the procedure. The patient developed no complications during the procedure.  IMPRESSIONS  1. Left ventricular ejection fraction, by estimation, is 60 to 65%. The left ventricle has normal function.  2. Right ventricular systolic function is normal. The right ventricular size is normal.  3. No left atrial/left atrial appendage thrombus was detected.  4. Moderate pericardial effusion. Moderate pleural effusion.  5. The mitral valve is normal in structure. Trivial mitral valve regurgitation.   6. Tricuspid valve regurgitation is mild to moderate.  7. The aortic valve is tricuspid. Aortic valve regurgitation is not visualized. No aortic stenosis is present.  8. Evidence of atrial level shunting detected by color flow Doppler. Agitated saline contrast bubble study was positive with shunting observed after >6 cardiac cycles suggestive of intrapulmonary shunting. Conclusion(s)/Recommendation(s): No intracardiac source of embolism detected on this on this transesophageal echocardiogram. FINDINGS  Left Ventricle: Left ventricular ejection fraction, by estimation, is 60 to 65%. The left ventricle has normal function. The left ventricular internal cavity size was normal in size. Right Ventricle: The right ventricular size is normal. No increase in right ventricular wall thickness. Right ventricular systolic function is normal. Left Atrium: Left atrial size was normal in size. No left atrial/left atrial appendage thrombus was detected. Right Atrium: Right atrial size was normal in size. Pericardium: A moderately sized pericardial effusion is present. Mitral Valve: The mitral valve is normal in structure. Trivial mitral valve regurgitation. Tricuspid Valve: The tricuspid valve is normal in structure. Tricuspid valve regurgitation is mild to moderate. Aortic Valve: The aortic valve is tricuspid. Aortic valve regurgitation is not visualized. No aortic stenosis is present. Pulmonic Valve: The pulmonic valve was not well visualized. Pulmonic valve regurgitation is trivial. Aorta: The aortic root and ascending aorta are structurally normal, with no evidence of dilitation. IAS/Shunts: Evidence of atrial level shunting detected by color flow Doppler. Agitated saline contrast was given intravenously to evaluate for intracardiac shunting. Agitated saline contrast bubble study was positive with shunting  observed after >6 cardiac cycles suggestive of intrapulmonary shunting. Additional Comments: There is a moderate pleural  effusion. Spectral Doppler performed. TRICUSPID VALVE TR Peak grad:   31.6 mmHg TR Mean grad:   21.0 mmHg TR Vmax:        281.00 cm/s TR Vmean:       218.0 cm/s Oswaldo Milian MD Electronically signed by Oswaldo Milian MD Signature Date/Time: 08/17/2022/1:59:41 PM    Final (Updated)    ECHOCARDIOGRAM COMPLETE  Result Date: 08/14/2022    ECHOCARDIOGRAM REPORT   Patient Name:   BRITNIE SITTERLY Date of Exam: 08/14/2022 Medical Rec #:  [id number]              Height:       62.0 in Accession #:    [id number]             Weight:       98.0 lb Date of Birth:  [birth year]               BSA:          1.412 m Patient Age:    30 years               BP:           134/68 mmHg Patient Gender: F                      HR:           74 bpm. Exam Location:  Inpatient Procedure: 2D Echo, Cardiac Doppler and Color Doppler Indications:    I63.9 Stroke  History:        Patient has prior history of Echocardiogram examinations, most                 recent 05/29/2021. Stroke; Risk Factors:Non-Smoker and                 Hypertension.  Sonographer:    Wilkie Aye RVT RCS Referring Phys: [id number] EMILY B MULLEN  Sonographer Comments: Some images are off axis IMPRESSIONS  1. Left ventricular ejection fraction, by estimation, is 65 to 70%. The left ventricle has normal function. The left ventricle has no regional wall motion abnormalities. Left ventricular diastolic parameters are consistent with Grade I diastolic dysfunction (impaired relaxation).  2. Right ventricular systolic function is normal. The right ventricular size is normal.  3. Moderate pericardial effusion. The pericardial effusion is circumferential. There is no evidence of cardiac tamponade.  4. The mitral valve is normal in structure. Trivial mitral valve regurgitation.  5. The aortic valve is tricuspid. Aortic valve regurgitation is not visualized. No aortic stenosis is present.  6. The inferior vena cava is normal in size with greater than 50% respiratory  variability, suggesting right atrial pressure of 3 mmHg. Comparison(s): Compared to prior TTE on 05/2021, a moderate sized pericardial effusion is now present. Conclusion(s)/Recommendation(s): No intracardiac source of embolism detected on this transthoracic study. Consider a transesophageal echocardiogram to exclude cardiac source of embolism if clinically indicated. FINDINGS  Left Ventricle: Left ventricular ejection fraction, by estimation, is 65 to 70%. The left ventricle has normal function. The left ventricle has no regional wall motion abnormalities. The left ventricular internal cavity size was normal in size. There is  no left ventricular hypertrophy. Left ventricular diastolic parameters are consistent with Grade I diastolic dysfunction (impaired relaxation). Right Ventricle: The right ventricular size is normal. No increase in right ventricular wall thickness. Right ventricular systolic function  is normal. Left Atrium: Left atrial size was normal in size. Right Atrium: Right atrial size was normal in size. Pericardium: A moderately sized pericardial effusion is present. The pericardial effusion is circumferential. There is no evidence of cardiac tamponade. Mitral Valve: The mitral valve is normal in structure. Trivial mitral valve regurgitation. Tricuspid Valve: The tricuspid valve is normal in structure. Tricuspid valve regurgitation is mild. Aortic Valve: The aortic valve is tricuspid. Aortic valve regurgitation is not visualized. No aortic stenosis is present. Aortic valve mean gradient measures 3.0 mmHg. Aortic valve peak gradient measures 6.2 mmHg. Aortic valve area, by VTI measures 2.21 cm. Pulmonic Valve: The pulmonic valve was normal in structure. Pulmonic valve regurgitation is trivial. Aorta: The aortic root is normal in size and structure. Venous: The inferior vena cava is normal in size with greater than 50% respiratory variability, suggesting right atrial pressure of 3 mmHg. IAS/Shunts: The  atrial septum is grossly normal.  LEFT VENTRICLE PLAX 2D LVIDd:         3.90 cm   Diastology LVIDs:         2.40 cm   LV e' medial:    5.33 cm/s LV PW:         1.00 cm   LV E/e' medial:  11.0 LV IVS:        0.90 cm   LV e' lateral:   9.46 cm/s LVOT diam:     1.80 cm   LV E/e' lateral: 6.2 LV SV:         53 LV SV Index:   38 LVOT Area:     2.54 cm  RIGHT VENTRICLE             IVC RV S prime:     15.30 cm/s  IVC diam: 1.10 cm TAPSE (M-mode): 2.1 cm LEFT ATRIUM             Index        RIGHT ATRIUM          Index LA diam:        2.40 cm 1.70 cm/m   RA Area:     9.25 cm LA Vol (A2C):   21.7 ml 15.37 ml/m  RA Volume:   18.95 ml 13.42 ml/m LA Vol (A4C):   31.5 ml 22.31 ml/m LA Biplane Vol: 28.3 ml 20.05 ml/m  AORTIC VALVE                    PULMONIC VALVE AV Area (Vmax):    2.09 cm     PV Vmax:          0.77 m/s AV Area (Vmean):   2.02 cm     PV Peak grad:     2.4 mmHg AV Area (VTI):     2.21 cm     PR End Diast Vel: 6.86 msec AV Vmax:           124.00 cm/s AV Vmean:          83.300 cm/s AV VTI:            0.241 m AV Peak Grad:      6.2 mmHg AV Mean Grad:      3.0 mmHg LVOT Vmax:         102.00 cm/s LVOT Vmean:        66.000 cm/s LVOT VTI:          0.209 m LVOT/AV VTI ratio: 0.87  AORTA Ao Root diam: 2.60  cm Ao Asc diam:  2.60 cm MITRAL VALVE               TRICUSPID VALVE MV Area (PHT): 2.99 cm    TR Peak grad:   17.5 mmHg MV Decel Time: 254 msec    TR Vmax:        209.00 cm/s MV E velocity: 58.60 cm/s MV A velocity: 89.70 cm/s  SHUNTS MV E/A ratio:  0.65        Systemic VTI:  0.21 m                            Systemic Diam: 1.80 cm Gwyndolyn Kaufman MD Electronically signed by Gwyndolyn Kaufman MD Signature Date/Time: 08/14/2022/2:29:20 PM    Final    CT ANGIO HEAD NECK W WO CM  Result Date: 08/13/2022 CLINICAL DATA:  Stroke follow-up EXAM: CT ANGIOGRAPHY HEAD AND NECK TECHNIQUE: Multidetector CT imaging of the head and neck was performed using the standard protocol during bolus administration of  intravenous contrast. Multiplanar CT image reconstructions and MIPs were obtained to evaluate the vascular anatomy. Carotid stenosis measurements (when applicable) are obtained utilizing NASCET criteria, using the distal internal carotid diameter as the denominator. RADIATION DOSE REDUCTION: This exam was performed according to the departmental dose-optimization program which includes automated exposure control, adjustment of the mA and/or kV according to patient size and/or use of iterative reconstruction technique. CONTRAST:  74mL OMNIPAQUE IOHEXOL 350 MG/ML SOLN COMPARISON:  07/01/2022 FINDINGS: CT HEAD FINDINGS Brain: There is no mass, hemorrhage or extra-axial collection. The size and configuration of the ventricles and extra-axial CSF spaces are normal. There is no acute or chronic infarction. The brain parenchyma is normal. Skull: The visualized skull base, calvarium and extracranial soft tissues are normal. Sinuses/Orbits: No fluid levels or advanced mucosal thickening of the visualized paranasal sinuses. No mastoid or middle ear effusion. The orbits are normal. CTA NECK FINDINGS SKELETON: There is no bony spinal canal stenosis. No lytic or blastic lesion. OTHER NECK: Normal pharynx, larynx and major salivary glands. No cervical lymphadenopathy. Unremarkable thyroid gland. UPPER CHEST: Masslike opacity at the right hilum measuring 3.4 x 2.1 cm. There is peribronchial thickening in the right lung apex. Small right pleural effusion. AORTIC ARCH: There is no calcific atherosclerosis of the aortic arch. There is no aneurysm, dissection or hemodynamically significant stenosis of the visualized portion of the aorta. Conventional 3 vessel aortic branching pattern. The visualized proximal subclavian arteries are widely patent. RIGHT CAROTID SYSTEM: Normal without aneurysm, dissection or stenosis. LEFT CAROTID SYSTEM: Normal without aneurysm, dissection or stenosis. VERTEBRAL ARTERIES: Left dominant configuration.  Both origins are clearly patent. There is no dissection, occlusion or flow-limiting stenosis to the skull base (V1-V3 segments). CTA HEAD FINDINGS POSTERIOR CIRCULATION: --Vertebral arteries: Normal V4 segments. --Inferior cerebellar arteries: Normal. --Basilar artery: Normal. --Superior cerebellar arteries: Normal. --Posterior cerebral arteries (PCA): Normal. ANTERIOR CIRCULATION: --Intracranial internal carotid arteries: Normal. --Anterior cerebral arteries (ACA): Normal. Both A1 segments are present. Patent anterior communicating artery (a-comm). --Middle cerebral arteries (MCA): Normal. VENOUS SINUSES: As permitted by contrast timing, patent. ANATOMIC VARIANTS: None Review of the MIP images confirms the above findings. IMPRESSION: 1. No emergent large vessel occlusion or high-grade stenosis of the intracranial or cervical arteries. 2. Masslike opacity at the right hilum measuring 3.4 x 2.1 cm, likely confluent hilar malignant adenopathy. 3. Small right pleural effusion. Electronically Signed   By: Ulyses Jarred M.D.   On: 08/13/2022 23:51  MR Brain Wo Contrast (neuro protocol)  Result Date: 08/13/2022 CLINICAL DATA:  TIA EXAM: MRI HEAD WITHOUT CONTRAST TECHNIQUE: Multiplanar, multiecho pulse sequences of the brain and surrounding structures were obtained without intravenous contrast. COMPARISON:  MRI Brain 07/31/22 FINDINGS: Brain: Small punctate focus of acute infarct in the left parietal lobe (series 5, image 33). No hemorrhage. No hydrocephalus. No extra-axial fluid collection. There are multiple small scattered subcortical T2/FLAIR hyperintense lesions, which are favored to represent sequela of chronic microvascular ischemic change in appear unchanged compared to recent prior brain MRI. Vascular: Normal flow voids. Skull and upper cervical spine: Normal marrow signal. Sinuses/Orbits: Negative. Other: None. IMPRESSION: Small focus of diffusion restriction in the left parietal lobe is favored to represent  a punctate acute infarct, new compared to 07/31/22. Given patient's history of malignancy a follow up MRI in 2-3 months to ensure resolution. Electronically Signed   By: Marin Roberts M.D.   On: 08/13/2022 15:59    Microbiology: Recent Results (from the past 240 hour(s))  Urine Culture (for pregnant, neutropenic or urologic patients or patients with an indwelling urinary catheter)     Status: None   Collection Time: 09/06/22  8:40 AM   Specimen: Urine, Clean Catch  Result Value Ref Range Status   Specimen Description   Final    URINE, CLEAN CATCH Performed at [facility], [place] [address], [place], [place] [address]    Special Requests   Final    NONE Performed at [facility], [place] [address] [place], [place] [address]    Culture   Final    NO GROWTH Performed at [facility], [place] [address], [place],  [address]    Report Status 09/07/2022 FINAL  Final     Labs: Basic Metabolic Panel: Recent Labs  Lab 09/03/22 0515 09/03/22 1600 09/04/22 0542 09/05/22 0555 09/06/22 0550 09/07/22 0500  NA 132* 133* 131* 133* 132*  --   K 2.9* 3.4* 3.4* 3.8 3.7  --   CL 106 108 104 105 106  --   CO2 21* 19* 21* 21* 21*  --   GLUCOSE 95 137* 97 98 91  --   BUN 11 10 11 14 12   --   CREATININE 0.49 0.49 0.41* 0.52 0.53  --   CALCIUM 6.9* 7.4* 7.6* 7.1* 7.3*  --   MG 1.8 1.8 1.5* 2.1 1.7 1.8  PHOS 2.5 1.8* 1.9* 3.1 2.5 2.1*   Liver Function Tests: Recent Labs  Lab 09/02/22 0528 09/03/22 0515 09/04/22 0542 09/05/22 0555 09/06/22 0550  AST 23 21 22 23 24   ALT 38 33 31 30 33  ALKPHOS 112 145* 232* 295* 339*  BILITOT 0.3 0.4 0.3 0.3 0.4  PROT 5.2* 4.8* 4.8* 4.9* 5.0*  ALBUMIN 2.8* 2.6* 2.5* 2.7* 2.5*   No results for input(s): "LIPASE", "AMYLASE" in the last 168 hours. No results for input(s): "AMMONIA" in the last 168 hours. CBC: Recent Labs  Lab 09/03/22 0515 09/04/22 0542 09/05/22 0555 09/06/22 0550 09/07/22 0500  WBC 5.9  5.9 7.6 5.8 4.5  NEUTROABS 3.5 3.2 4.4 3.2 2.6  HGB 9.4* 10.1* 10.6* 10.0* 9.6*  HCT 27.6* 29.4* 31.4* 30.1* 29.6*  MCV 91.7 91.9 91.8 93.2 94.6  PLT 345 426* 509* 484* 463*   Cardiac Enzymes: No results for input(s): "CKTOTAL", "CKMB", "CKMBINDEX", "TROPONINI" in the last 168 hours. BNP: BNP (last 3 results) Recent Labs    08/21/22 1930  BNP 36.3    ProBNP (last  3 results) No results for input(s): "PROBNP" in the last 8760 hours.  CBG: No results for input(s): "GLUCAP" in the last 168 hours.     Signed:  Dia Crawford, MD [facility]

## 2022-09-06 NOTE — Progress Notes (Signed)
PROGRESS NOTE    Karen [name]  [id number] DOB: 1966/04/16 DOA: 08/21/2022 PCP: Starlyn Skeans, MD     Brief Narrative:  57 year old HF PMHx primary language Spanish (understands significant amount of English) non-small cell lung cancer on chemotherapy, chronic back pain and essential HTN, who underwent first round of induction chemotherapy with carboplatin and Alimta on 08/18/22 and since then started having intractable nausea, vomiting chest pain and shortness of breath and unable to tolerate oral medications and diet    Admitted. She was found to have sided pleural effusion which was drained resulting in pneumothorax which was managed by pigtail catheter placement on 08/24/2022 by IR. Pulmonary and palliative care were consulted. Pigtail catheter removed on 08/28/2022.    Subjective: 3/18 A/O x 4.  Negative abdominal pain, negative chest pain.  Still having polyuria, negative dysuria.  States does not have transportation arranged for today's discharge, request redischarge in a.m.   Assessment & Plan: Covid vaccination;   Principal Problem:   Pleural effusion on right Active Problems:   Intractable nausea and vomiting   Primary adenocarcinoma of lung (HCC)   Cancer associated pain   Major depression, recurrent, chronic (HCC)   History of ischemic stroke   Goals of care, counseling/discussion   Malignant pleural effusion   Protein-calorie malnutrition, severe (HCC)  Metastatic non-small cell lung cancer -On chemotherapy -Seen by Dr. Sullivan Lone Oncology evaluated the patient during this hospitalization.  Outpatient follow-up with oncology. -Overall prognosis is guarded to poor.   -3/12 per Dr. Sullivan Lone oncology started on Dabrafenib plus Trametinib towards treatment of her metastatic BRAF mutated lung adenocarcinoma.  -3/12per Dr. Sullivan Lone oncology dexamethasone 2 mg daily to suppress medication related fever oncology to wean off as outpatient. -3/13 palliative care  evaluated continue current treatment.  Full code.  Patient would be a good hospice candidate however per palliative care TBD.   -3/14Per Oncology             - have to get more active and be able to nutritionally sustain herself to benefit from palliative treatments. -Dabrafenib 150 mg BID -Mekinist 2 mg daily -Follow-up with oncology Dr. Dillon Bjork on 3/20 -follow up with RN Lexine Baton at [facility] for palliative medicine    Right-sided pleural effusion  -S/p Thoracentesis with resultant right-sided hydropneumothorax -3/5 s/p.  Pigtail catheter placed by IR  -3/9 PCCM removed pigtail catheter  -Per PCCM follow-up as outpatient upon discharge.  Signed off 3/11 -3/15 PCXR; recurrence of malignant pleural effusion -3/15 in a.m. will convene multidisciplinary rounds consisting of  Dr. Sullivan Lone oncology, and PCCM.  Discussed placement of Pleurx catheter -3/16 See metastatic lung cancer.  No Pleurx cath -3/16 discussed case with [facility] PCCM regarding the fact that RIGHT metastatic pleural effusion is beginning to reaccumulate.  Will see patient and discuss placement of Pleurx cath vs frequent thoracentesis.  Do not like to place Pleurx cath in malignant pleural effusions.  Will await recommendations.  Also included Dr. Sullivan Lone oncology in chat. -3/16 ADDENDUM;Dr.Adewale Olalere PCCM concurred that placing a pigtail catheter at this time would not be appropriate.  Thoracentesis for significant reaccumulation (expect by next 7 to 10 days will require additional thoracentesis) -Per PCCM             -Can do thoracentesis PRN if patient benefits symptomatically.Alternative would be pleurX   -Follow-up with Dr. Verlee Monte PCCM on 09/20/22 to discuss further.      Intractable nausea and vomiting -Most likely secondary  to chemotherapy - Compazine 10 mg IV PRN.  Will discuss with patient will have to find some PO regimen to control her N/V - Lorazepam 2 mg TID -Emend  150 mg+ dexamethasone 12 mg  x 2 more days - Zofran IV 8 mg BID -3/14 resolved   Severe cancer pain  - 3/13 pain controlled today per patient - 3/14 increase Fentanyl patch 25 mcg   Major depression -Zyprexa 10 mg BID   Stress incontinence vs UTI - 3/17 urine culture pending - 3/17 if negative begin medication for stress incontinence -Urine culture not drawn until 3/18, therefore will not start medication for stress incontinence.  Given symptoms Bactrim DS x 3 days.  Patient will see oncology on 3/20   Leukocytosis - Resolved   Hyponatremia - Poor oral intake, most likely component of dehydration - Normal saline 67ml/hr   Hypokalemia - Potassium goal >4 - 3/15 K-Dur 60 mEq -3/15 recheck potassium @1600  -3/16 K-Phos 30 mmol + K-Dur 50 mEq - K-Dur 40 mEq daily, PCP/oncology to adjust medication   Hypocalcemia - Calcium goal> 8.9 - 3/15 corrected calcium= 8.0 - 3/15 Calcium Gluconate 3 g -3/16 corrected calcium= 8.8 -3/16 = calcium gluconate 2 g -3/17= corrected calcium= 8.1 - 3/17 calcium gluconate 4 g -Calcium carbonate 420 mg daily   Hypophosphatemia -Phosphorus goal> 2.5 - 3/14 sodium phosphate 30 mmol -3/16 see hypokalemia -Resolved   Hypomagnesmia - Magnesium goal> 2 - 3/16 Magnesium IV 4 g -Magnesium oxide 400 mg daily   Hx ischemic stroke in 07/2022 --ASA 81 mg daily - Plavix 75 mg daily   HLD - 3/14 LDL = 49  - Hold on starting statin.     Anemia of chronic disease? -Transfuse for hemoglobin<7 -3/13 anemia panel pending   Severe protein calorie malnutrition - 3/14 patient informed family can bring any food into hospital for her that she will eat. - 3/14 Megace 400 mg BID       Mobility Assessment (last 72 hours)     Mobility Assessment     Row Name 09/05/22 2140 09/04/22 1940 09/03/22 2140       Does patient have an order for bedrest or is patient medically unstable No - Continue assessment No - Continue assessment No - Continue assessment     What is the highest  level of mobility based on the progressive mobility assessment? Level 6 (Walks independently in room and hall) - Balance while walking in room without assist - Complete Level 6 (Walks independently in room and hall) - Balance while walking in room without assist - Complete Level 6 (Walks independently in room and hall) - Balance while walking in room without assist - Complete                    DVT prophylaxis: Lovenox Code Status: Full Family Communication: 3/16 husband at bedside for discussion of plan of care all questions answered Status is: Inpatient       Dispo: The patient is from: Home              Anticipated d/c is to: [facility]              Anticipated d/c date is: 3/19              Patient currently is not medically stable to d/c.      Consultants:  Oncology Dr.Mitra Marlyce Huge PCCM  Procedures/Significant Events:    I have personally reviewed and interpreted all  radiology studies and my findings are as above.  VENTILATOR SETTINGS:    Cultures   Antimicrobials:    Devices    LINES / TUBES:      Continuous Infusions:  sodium chloride 75 mL/hr at 09/06/22 0028     Objective: Vitals:   09/05/22 1306 09/05/22 2249 09/06/22 0531 09/06/22 1255  BP: 115/82 110/72 109/78 120/78  Pulse: 99 (!) 103 100 (!) 105  Resp: 17 17 18 16   Temp: 98.8 F (37.1 C) 98.3 F (36.8 C) 98.2 F (36.8 C) 97.8 F (36.6 C)  TempSrc: Oral Oral Oral Oral  SpO2: 94% 95% 99% 97%  Weight:      Height:        Intake/Output Summary (Last 24 hours) at 09/06/2022 1614 Last data filed at 09/06/2022 1300 Gross per 24 hour  Intake 885 ml  Output 300 ml  Net 585 ml   Filed Weights   08/21/22 1919 08/22/22 0146  Weight: 46.3 kg 45.8 kg    Examination:  General: A/O x 4, No acute respiratory distress, cachectic Eyes: negative scleral hemorrhage, negative anisocoria, negative icterus ENT: Negative Runny nose, negative gingival bleeding, Neck:   Negative scars, masses, torticollis, lymphadenopathy, JVD Lungs: Clear to auscultation bilaterally without wheezes or crackles Cardiovascular: Regular rate and rhythm without murmur gallop or rub normal S1 and S2 Abdomen: negative abdominal pain, nondistended, positive soft, bowel sounds, no rebound, no ascites, no appreciable mass Extremities: No significant cyanosis, clubbing, or edema bilateral lower extremities Skin: Negative rashes, lesions, ulcers Psychiatric:  Negative depression, negative anxiety, negative fatigue, negative mania  Central nervous system:  Cranial nerves II through XII intact, tongue/uvula midline, all extremities muscle strength 5/5, sensation intact throughout, negative dysarthria, negative expressive aphasia, negative receptive aphasia.  .     Data Reviewed: Care during the described time interval was provided by me .  I have reviewed this patient's available data, including medical history, events of note, physical examination, and all test results as part of my evaluation.  CBC: Recent Labs  Lab 09/02/22 0528 09/03/22 0515 09/04/22 0542 09/05/22 0555 09/06/22 0550  WBC 6.4 5.9 5.9 7.6 5.8  NEUTROABS 3.7 3.5 3.2 4.4 3.2  HGB 9.6* 9.4* 10.1* 10.6* 10.0*  HCT 28.9* 27.6* 29.4* 31.4* 30.1*  MCV 92.9 91.7 91.9 91.8 93.2  PLT 358 345 426* 509* [id number]*   Basic Metabolic Panel: Recent Labs  Lab 09/03/22 0515 09/03/22 1600 09/04/22 0542 09/05/22 0555 09/06/22 0550  NA 132* 133* 131* 133* 132*  K 2.9* 3.4* 3.4* 3.8 3.7  CL 106 108 104 105 106  CO2 21* 19* 21* 21* 21*  GLUCOSE 95 137* 97 98 91  BUN 11 10 11 14 12   CREATININE 0.49 0.49 0.41* 0.52 0.53  CALCIUM 6.9* 7.4* 7.6* 7.1* 7.3*  MG 1.8 1.8 1.5* 2.1 1.7  PHOS 2.5 1.8* 1.9* 3.1 2.5   GFR: Estimated Creatinine Clearance: 56.8 mL/min (by C-G formula based on SCr of 0.53 mg/dL). Liver Function Tests: Recent Labs  Lab 09/02/22 0528 09/03/22 0515 09/04/22 0542 09/05/22 0555 09/06/22 0550  AST  23 21 22 23 24   ALT 38 33 31 30 33  ALKPHOS 112 145* 232* 295* 339*  BILITOT 0.3 0.4 0.3 0.3 0.4  PROT 5.2* 4.8* 4.8* 4.9* 5.0*  ALBUMIN 2.8* 2.6* 2.5* 2.7* 2.5*   No results for input(s): "LIPASE", "AMYLASE" in the last 168 hours. No results for input(s): "AMMONIA" in the last 168 hours. Coagulation Profile: No results for input(s): "INR", "PROTIME"  in the last 168 hours. Cardiac Enzymes: No results for input(s): "CKTOTAL", "CKMB", "CKMBINDEX", "TROPONINI" in the last 168 hours. BNP (last 3 results) No results for input(s): "PROBNP" in the last 8760 hours. HbA1C: No results for input(s): "HGBA1C" in the last 72 hours. CBG: No results for input(s): "GLUCAP" in the last 168 hours. Lipid Profile: No results for input(s): "CHOL", "HDL", "LDLCALC", "TRIG", "CHOLHDL", "LDLDIRECT" in the last 72 hours. Thyroid Function Tests: No results for input(s): "TSH", "T4TOTAL", "FREET4", "T3FREE", "THYROIDAB" in the last 72 hours. Anemia Panel: No results for input(s): "VITAMINB12", "FOLATE", "FERRITIN", "TIBC", "IRON", "RETICCTPCT" in the last 72 hours. Sepsis Labs: No results for input(s): "PROCALCITON", "LATICACIDVEN" in the last 168 hours.  No results found for this or any previous visit (from the past 240 hour(s)).       Radiology Studies: No results found.      Scheduled Meds:  aspirin EC  81 mg Oral Daily   [START ON 09/07/2022] calcium carbonate  420 mg Oral Daily   Chlorhexidine Gluconate Cloth  6 each Topical Daily   clopidogrel  75 mg Oral Daily   dabrafenib mesylate  150 mg Oral Q12H   dexamethasone  2 mg Oral Daily   dextromethorphan-guaiFENesin  1 tablet Oral BID   diclofenac Sodium  4 g Topical QID   enoxaparin (LOVENOX) injection  40 mg Subcutaneous Q24H   feeding supplement  1 Container Oral TID BM   fentaNYL  1 patch Transdermal [id number]   folic acid  1 mg Oral Daily   LORazepam  2 mg Sublingual TID AC & HS   magnesium oxide  400 mg Oral Daily   megestrol  400 mg  Oral BID   OLANZapine zydis  10 mg Oral Daily   OLANZapine zydis  5 mg Oral QHS   ondansetron  8 mg Oral Q12H   pantoprazole (PROTONIX) IV  40 mg Intravenous Q12H   potassium chloride  40 mEq Oral Daily   senna  1 tablet Oral QHS   sertraline  100 mg Oral BID   sulfamethoxazole-trimethoprim  1 tablet Oral Q12H   trametinib dimethyl sulfoxide  2 mg Oral Daily   Continuous Infusions:  sodium chloride 75 mL/hr at 09/06/22 0028     LOS: 15 days    Time spent:40 min    Taiten Brawn, Geraldo Docker, MD [facility]   If 7PM-7AM, please contact night-coverage 09/06/2022, 4:14 PM

## 2022-09-06 NOTE — Progress Notes (Signed)
Case and imaging reviewed.  Can do thoracentesis PRN if patient benefits symptomatically.  Alternative would be pleurX but she would need someone to drain at home or [employer] would need to approve skilled nursing care at home to do this.  She has an appt with Dr. Verlee Monte on 09/20/22 to discuss further.  Please reach out if any questions or concerns (securechat preferred).  Erskine Emery MD PCCM

## 2022-09-06 NOTE — Progress Notes (Signed)
  Daily Progress Note   Patient Name: Karen [name]       Date: 09/06/2022 DOB: 03-21-1966  Age: 57 y.o. MRN#: [id number] Attending Physician: Allie Bossier, MD Primary Care Physician: Starlyn Skeans, MD Admit Date: 08/21/2022 Length of Stay: 15 days  Discussed care with primary hospitalist today. Patient last seen by palliative provider Elie Confer, NP on 09/03/22. Patient planning to follow up with outpatient oncology. Patient to be discharged today. Have placed referral to PMT provider at [facility] to assist with symptom management once discharged. Will sign off. Please reach out if PMT can be of assistance in the future. Thank you for involving our team in patient's care.    Chelsea Aus, DO Palliative Care Provider PMT # [phone]

## 2022-09-06 NOTE — Progress Notes (Signed)
PT Cancellation Note  Patient Details Name: Karen [name] MRN: [id number] DOB: 1965-10-11   Cancelled Treatment:    Reason Eval/Treat Not Completed: Other (comment) Declined PT due to wanting to visit with family.  Did see pt ambulating with family in hallway.  Will f/u as able.  Abran Richard, PT [facility] [phone]   Karlton Lemon 09/06/2022, 1:33 PM

## 2022-09-07 ENCOUNTER — Other Ambulatory Visit (HOSPITAL_COMMUNITY): Payer: Self-pay

## 2022-09-07 ENCOUNTER — Encounter (HOSPITAL_COMMUNITY): Payer: Self-pay

## 2022-09-07 DIAGNOSIS — G893 Neoplasm related pain (acute) (chronic): Secondary | ICD-10-CM | POA: Diagnosis not present

## 2022-09-07 DIAGNOSIS — F411 Generalized anxiety disorder: Secondary | ICD-10-CM | POA: Diagnosis not present

## 2022-09-07 DIAGNOSIS — R630 Anorexia: Secondary | ICD-10-CM | POA: Diagnosis not present

## 2022-09-07 DIAGNOSIS — J90 Pleural effusion, not elsewhere classified: Secondary | ICD-10-CM | POA: Diagnosis not present

## 2022-09-07 LAB — CBC WITH DIFFERENTIAL/PLATELET
Abs Immature Granulocytes: 0.02 10*3/uL (ref 0.00–0.07)
Basophils Absolute: 0 10*3/uL (ref 0.0–0.1)
Basophils Relative: 0 %
Eosinophils Absolute: 0.1 10*3/uL (ref 0.0–0.5)
Eosinophils Relative: 2 %
HCT: 29.6 % — ABNORMAL LOW (ref 36.0–46.0)
Hemoglobin: 9.6 g/dL — ABNORMAL LOW (ref 12.0–15.0)
Immature Granulocytes: 0 %
Lymphocytes Relative: 33 %
Lymphs Abs: 1.5 10*3/uL (ref 0.7–4.0)
MCH: 30.7 pg (ref 26.0–34.0)
MCHC: 32.4 g/dL (ref 30.0–36.0)
MCV: 94.6 fL (ref 80.0–100.0)
Monocytes Absolute: 0.3 10*3/uL (ref 0.1–1.0)
Monocytes Relative: 8 %
Neutro Abs: 2.6 10*3/uL (ref 1.7–7.7)
Neutrophils Relative %: 57 %
Platelets: 463 10*3/uL — ABNORMAL HIGH (ref 150–400)
RBC: 3.13 MIL/uL — ABNORMAL LOW (ref 3.87–5.11)
RDW: 14.6 % (ref 11.5–15.5)
WBC: 4.5 10*3/uL (ref 4.0–10.5)
nRBC: 0 % (ref 0.0–0.2)

## 2022-09-07 LAB — URINE CULTURE: Culture: NO GROWTH

## 2022-09-07 LAB — MAGNESIUM: Magnesium: 1.8 mg/dL (ref 1.7–2.4)

## 2022-09-07 LAB — PHOSPHORUS: Phosphorus: 2.1 mg/dL — ABNORMAL LOW (ref 2.5–4.6)

## 2022-09-07 MED ORDER — ACETAMINOPHEN 325 MG PO TABS: 650.0000 mg | ORAL_TABLET | Freq: Four times a day (QID) | ORAL | 0 refills | Status: DC | PRN

## 2022-09-07 MED ORDER — CALCIUM CARBONATE ANTACID 500 MG PO CHEW
1.0000 | CHEWABLE_TABLET | Freq: Every day | ORAL | Status: DC
  Administered 2022-09-07: 200 mg via ORAL
  Filled 2022-09-07: qty 1

## 2022-09-07 MED ORDER — HEPARIN SOD (PORK) LOCK FLUSH 100 UNIT/ML IV SOLN
500.0000 [IU] | INTRAVENOUS | Status: AC | PRN
  Administered 2022-09-07: 500 [IU]
  Filled 2022-09-07: qty 5

## 2022-09-07 MED FILL — Dexamethasone Sodium Phosphate Inj 100 MG/10ML: INTRAMUSCULAR | Qty: 1 | Status: AC

## 2022-09-07 MED FILL — Fosaprepitant Dimeglumine For IV Infusion 150 MG (Base Eq): INTRAVENOUS | Qty: 5 | Status: AC

## 2022-09-07 NOTE — TOC Transition Note (Addendum)
Transition of Care [facility]) - CM/SW Discharge Note   Patient Details  Name: Karen [name] MRN: [id number] Date of Birth: April 10, 1966  [facility] CM/SW Contact:  Angelita Ingles, RN Phone Number:[phone]  09/07/2022, 10:07 AM   Clinical Narrative:    Patient has discharge orders. CM followed up with spouse for home health recommendations due to patient does not give response on previous attempts to discuss. CM spoke with spouse [name] Smithwho states that he doesn't think that the patient wants to participate in any type of physical therapy once discharged. Spouse states that he is currently on his way to facility and will update CM on final decision once he arrives.   1021 BCS and rolling walker have been ordered and to be delivered to the bedside. Bedside nurse updated.   1040 Patient refused DME . Husband states that she doesn't need DME/ DME has been cancelled.          Patient Goals and CMS Choice      Discharge Placement                         Discharge Plan and Services Additional resources added to the After Visit Summary for                                       Social Determinants of Health (SDOH) Interventions SDOH Screenings   Food Insecurity: No Food Insecurity (08/22/2022)  Housing: Low Risk  (08/22/2022)  Transportation Needs: No Transportation Needs (08/22/2022)  Recent Concern: Transportation Needs - Unmet Transportation Needs (07/09/2022)  Utilities: Not At Risk (08/22/2022)  Depression (PHQ2-9): High Risk (08/10/2022)  Social Connections: Socially Isolated (07/09/2022)  Tobacco Use: Low Risk  (08/21/2022)     Readmission Risk Interventions     No data to display

## 2022-09-08 ENCOUNTER — Other Ambulatory Visit: Payer: Self-pay | Admitting: Hematology

## 2022-09-08 ENCOUNTER — Ambulatory Visit: Payer: Commercial Managed Care - HMO

## 2022-09-08 ENCOUNTER — Other Ambulatory Visit: Payer: Self-pay | Admitting: Physician Assistant

## 2022-09-08 ENCOUNTER — Other Ambulatory Visit: Payer: Self-pay

## 2022-09-08 ENCOUNTER — Telehealth: Payer: Self-pay

## 2022-09-08 ENCOUNTER — Other Ambulatory Visit (HOSPITAL_COMMUNITY): Payer: Self-pay

## 2022-09-08 ENCOUNTER — Other Ambulatory Visit: Payer: Commercial Managed Care - HMO

## 2022-09-08 ENCOUNTER — Encounter: Payer: Self-pay | Admitting: Hematology

## 2022-09-08 ENCOUNTER — Inpatient Hospital Stay (HOSPITAL_BASED_OUTPATIENT_CLINIC_OR_DEPARTMENT_OTHER): Payer: Commercial Managed Care - HMO | Admitting: Physician Assistant

## 2022-09-08 ENCOUNTER — Inpatient Hospital Stay (HOSPITAL_BASED_OUTPATIENT_CLINIC_OR_DEPARTMENT_OTHER): Payer: Commercial Managed Care - HMO | Admitting: Nurse Practitioner

## 2022-09-08 ENCOUNTER — Inpatient Hospital Stay: Payer: Commercial Managed Care - HMO

## 2022-09-08 ENCOUNTER — Encounter: Payer: Self-pay | Admitting: Nurse Practitioner

## 2022-09-08 VITALS — BP 127/95 | HR 116 | Temp 97.9°F | Resp 15 | Wt 98.8 lb

## 2022-09-08 DIAGNOSIS — M549 Dorsalgia, unspecified: Secondary | ICD-10-CM | POA: Diagnosis present

## 2022-09-08 DIAGNOSIS — I3131 Malignant pericardial effusion in diseases classified elsewhere: Secondary | ICD-10-CM | POA: Diagnosis present

## 2022-09-08 DIAGNOSIS — C419 Malignant neoplasm of bone and articular cartilage, unspecified: Secondary | ICD-10-CM

## 2022-09-08 DIAGNOSIS — I10 Essential (primary) hypertension: Secondary | ICD-10-CM | POA: Diagnosis present

## 2022-09-08 DIAGNOSIS — J910 Malignant pleural effusion: Secondary | ICD-10-CM | POA: Diagnosis present

## 2022-09-08 DIAGNOSIS — G893 Neoplasm related pain (acute) (chronic): Secondary | ICD-10-CM

## 2022-09-08 DIAGNOSIS — G8929 Other chronic pain: Secondary | ICD-10-CM | POA: Diagnosis present

## 2022-09-08 DIAGNOSIS — T39395A Adverse effect of other nonsteroidal anti-inflammatory drugs [NSAID], initial encounter: Secondary | ICD-10-CM | POA: Diagnosis present

## 2022-09-08 DIAGNOSIS — Z515 Encounter for palliative care: Secondary | ICD-10-CM

## 2022-09-08 DIAGNOSIS — C3490 Malignant neoplasm of unspecified part of unspecified bronchus or lung: Secondary | ICD-10-CM

## 2022-09-08 DIAGNOSIS — R7401 Elevation of levels of liver transaminase levels: Secondary | ICD-10-CM | POA: Diagnosis present

## 2022-09-08 DIAGNOSIS — F32A Depression, unspecified: Secondary | ICD-10-CM | POA: Diagnosis present

## 2022-09-08 DIAGNOSIS — Z5982 Transportation insecurity: Secondary | ICD-10-CM

## 2022-09-08 DIAGNOSIS — K529 Noninfective gastroenteritis and colitis, unspecified: Principal | ICD-10-CM | POA: Diagnosis present

## 2022-09-08 DIAGNOSIS — Z885 Allergy status to narcotic agent status: Secondary | ICD-10-CM

## 2022-09-08 DIAGNOSIS — Z79899 Other long term (current) drug therapy: Secondary | ICD-10-CM

## 2022-09-08 DIAGNOSIS — R530 Neoplastic (malignant) related fatigue: Secondary | ICD-10-CM

## 2022-09-08 DIAGNOSIS — R634 Abnormal weight loss: Secondary | ICD-10-CM | POA: Diagnosis not present

## 2022-09-08 DIAGNOSIS — D630 Anemia in neoplastic disease: Secondary | ICD-10-CM | POA: Diagnosis present

## 2022-09-08 DIAGNOSIS — E441 Mild protein-calorie malnutrition: Secondary | ICD-10-CM | POA: Diagnosis present

## 2022-09-08 DIAGNOSIS — F419 Anxiety disorder, unspecified: Secondary | ICD-10-CM | POA: Diagnosis present

## 2022-09-08 DIAGNOSIS — Z7902 Long term (current) use of antithrombotics/antiplatelets: Secondary | ICD-10-CM

## 2022-09-08 DIAGNOSIS — R627 Adult failure to thrive: Secondary | ICD-10-CM | POA: Diagnosis present

## 2022-09-08 DIAGNOSIS — Z90710 Acquired absence of both cervix and uterus: Secondary | ICD-10-CM

## 2022-09-08 DIAGNOSIS — Z888 Allergy status to other drugs, medicaments and biological substances status: Secondary | ICD-10-CM

## 2022-09-08 DIAGNOSIS — Z7189 Other specified counseling: Secondary | ICD-10-CM

## 2022-09-08 DIAGNOSIS — E872 Acidosis, unspecified: Secondary | ICD-10-CM | POA: Diagnosis present

## 2022-09-08 DIAGNOSIS — R7989 Other specified abnormal findings of blood chemistry: Secondary | ICD-10-CM | POA: Diagnosis present

## 2022-09-08 DIAGNOSIS — E785 Hyperlipidemia, unspecified: Secondary | ICD-10-CM | POA: Diagnosis present

## 2022-09-08 DIAGNOSIS — R112 Nausea with vomiting, unspecified: Secondary | ICD-10-CM

## 2022-09-08 DIAGNOSIS — R630 Anorexia: Secondary | ICD-10-CM

## 2022-09-08 DIAGNOSIS — Z8615 Personal history of latent tuberculosis infection: Secondary | ICD-10-CM

## 2022-09-08 DIAGNOSIS — Z95828 Presence of other vascular implants and grafts: Secondary | ICD-10-CM | POA: Insufficient documentation

## 2022-09-08 DIAGNOSIS — E876 Hypokalemia: Secondary | ICD-10-CM | POA: Diagnosis present

## 2022-09-08 DIAGNOSIS — K2970 Gastritis, unspecified, without bleeding: Secondary | ICD-10-CM | POA: Diagnosis present

## 2022-09-08 DIAGNOSIS — K5900 Constipation, unspecified: Secondary | ICD-10-CM | POA: Diagnosis present

## 2022-09-08 DIAGNOSIS — Y92009 Unspecified place in unspecified non-institutional (private) residence as the place of occurrence of the external cause: Secondary | ICD-10-CM

## 2022-09-08 DIAGNOSIS — Z8673 Personal history of transient ischemic attack (TIA), and cerebral infarction without residual deficits: Secondary | ICD-10-CM

## 2022-09-08 DIAGNOSIS — B370 Candidal stomatitis: Secondary | ICD-10-CM | POA: Diagnosis present

## 2022-09-08 DIAGNOSIS — C7951 Secondary malignant neoplasm of bone: Secondary | ICD-10-CM | POA: Diagnosis present

## 2022-09-08 DIAGNOSIS — R748 Abnormal levels of other serum enzymes: Secondary | ICD-10-CM | POA: Diagnosis present

## 2022-09-08 DIAGNOSIS — Z7982 Long term (current) use of aspirin: Secondary | ICD-10-CM

## 2022-09-08 DIAGNOSIS — Z91419 Personal history of unspecified adult abuse: Secondary | ICD-10-CM

## 2022-09-08 DIAGNOSIS — E860 Dehydration: Secondary | ICD-10-CM | POA: Diagnosis present

## 2022-09-08 DIAGNOSIS — R188 Other ascites: Secondary | ICD-10-CM | POA: Diagnosis present

## 2022-09-08 DIAGNOSIS — Z8249 Family history of ischemic heart disease and other diseases of the circulatory system: Secondary | ICD-10-CM

## 2022-09-08 DIAGNOSIS — D75839 Thrombocytosis, unspecified: Secondary | ICD-10-CM | POA: Diagnosis present

## 2022-09-08 DIAGNOSIS — Z681 Body mass index (BMI) 19 or less, adult: Secondary | ICD-10-CM

## 2022-09-08 LAB — CMP (CANCER CENTER ONLY)
ALT: 41 U/L (ref 0–44)
AST: 36 U/L (ref 15–41)
Albumin: 3.4 g/dL — ABNORMAL LOW (ref 3.5–5.0)
Alkaline Phosphatase: 492 U/L — ABNORMAL HIGH (ref 38–126)
Anion gap: 8 (ref 5–15)
BUN: 7 mg/dL (ref 6–20)
CO2: 21 mmol/L — ABNORMAL LOW (ref 22–32)
Calcium: 8 mg/dL — ABNORMAL LOW (ref 8.9–10.3)
Chloride: 108 mmol/L (ref 98–111)
Creatinine: 0.58 mg/dL (ref 0.44–1.00)
GFR, Estimated: >60 mL/min (ref >60)
Glucose, Bld: 88 mg/dL (ref 70–99)
Potassium: 3.5 mmol/L (ref 3.5–5.1)
Sodium: 137 mmol/L (ref 135–145)
Total Bilirubin: 0.2 mg/dL — ABNORMAL LOW (ref 0.3–1.2)
Total Protein: 6 g/dL — ABNORMAL LOW (ref 6.5–8.1)

## 2022-09-08 LAB — CBC WITH DIFFERENTIAL (CANCER CENTER ONLY)
Abs Immature Granulocytes: 0.02 10*3/uL (ref 0.00–0.07)
Basophils Absolute: 0 10*3/uL (ref 0.0–0.1)
Basophils Relative: 1 %
Eosinophils Absolute: 0.2 10*3/uL (ref 0.0–0.5)
Eosinophils Relative: 3 %
HCT: 34.2 % — ABNORMAL LOW (ref 36.0–46.0)
Hemoglobin: 11.8 g/dL — ABNORMAL LOW (ref 12.0–15.0)
Immature Granulocytes: 0 %
Lymphocytes Relative: 51 %
Lymphs Abs: 2.5 10*3/uL (ref 0.7–4.0)
MCH: 31.6 pg (ref 26.0–34.0)
MCHC: 34.5 g/dL (ref 30.0–36.0)
MCV: 91.4 fL (ref 80.0–100.0)
Monocytes Absolute: 0.5 10*3/uL (ref 0.1–1.0)
Monocytes Relative: 10 %
Neutro Abs: 1.7 10*3/uL (ref 1.7–7.7)
Neutrophils Relative %: 35 %
Platelet Count: 556 10*3/uL — ABNORMAL HIGH (ref 150–400)
RBC: 3.74 MIL/uL — ABNORMAL LOW (ref 3.87–5.11)
RDW: 15 % (ref 11.5–15.5)
WBC Count: 4.9 10*3/uL (ref 4.0–10.5)
nRBC: 0 % (ref 0.0–0.2)

## 2022-09-08 MED ORDER — SODIUM CHLORIDE 0.9 % IV SOLN: 8.0000 mg | Freq: Once | INTRAVENOUS | Status: DC

## 2022-09-08 MED ORDER — ONDANSETRON 8 MG PO TBDP
8.0000 mg | ORAL_TABLET | Freq: Three times a day (TID) | ORAL | 0 refills | Status: DC | PRN
  Filled 2022-09-08: qty 30, 10d supply, fill #0

## 2022-09-08 MED ORDER — FENTANYL 25 MCG/HR TD PT72: 1.0000 | MEDICATED_PATCH | TRANSDERMAL | 0 refills | Status: DC

## 2022-09-08 MED ORDER — PROCHLORPERAZINE MALEATE 10 MG PO TABS
10.0000 mg | ORAL_TABLET | Freq: Four times a day (QID) | ORAL | 0 refills | Status: DC | PRN
  Filled 2022-09-08: qty 60, 15d supply, fill #0

## 2022-09-08 MED ORDER — SODIUM CHLORIDE 0.9% FLUSH
10.0000 mL | Freq: Once | INTRAVENOUS | Status: AC
  Administered 2022-09-08: 10 mL

## 2022-09-08 MED ORDER — LORAZEPAM 0.5 MG PO TABS
0.5000 mg | ORAL_TABLET | Freq: Four times a day (QID) | ORAL | 0 refills | Status: DC | PRN
  Filled 2022-09-08: qty 10, 3d supply, fill #0

## 2022-09-08 MED ORDER — ONDANSETRON HCL 4 MG/2ML IJ SOLN
8.0000 mg | Freq: Once | INTRAMUSCULAR | Status: AC
  Administered 2022-09-08: 8 mg via INTRAVENOUS
  Filled 2022-09-08: qty 4

## 2022-09-08 MED ORDER — FENTANYL 25 MCG/HR TD PT72
1.0000 | MEDICATED_PATCH | TRANSDERMAL | 0 refills | Status: DC
  Filled 2022-09-08 (×2): qty 10, 30d supply, fill #0

## 2022-09-08 MED ORDER — SODIUM CHLORIDE 0.9 % IV SOLN: INTRAVENOUS | Status: DC

## 2022-09-08 NOTE — Progress Notes (Unsigned)
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 Informed written consent was obtained from the patient after a discussion of the risks, benefits and alternatives to treatment. The patient was placed supine on the CT gantry and a pre procedural CT was performed re-demonstrating the known moderate-sized right-sided hydropneumothorax. The procedure was planned utilizing an anterolateral approach with attention made to avoid the port a catheter reservoir site. A timeout was performed prior to the initiation of the procedure. The skin overlying the right upper chest was prepped and draped in the usual sterile fashion. The overlying soft tissues were anesthetized with 1% lidocaine with epinephrine. Next, an 18 gauge trocar needle was utilized to access the superolateral aspect of the right pleural space. Appropriate position was confirmed with the aspiration of air. Next, a short Amplatz wire was coiled within the apical aspect of the right pleural space. Appropriate position was confirmed with CT imaging (series 6). Next, the tract was serially dilated allowing placement of a 10 [place] all-purpose  drainage catheter. Appropriate positioning was confirmed with a limited postprocedural CT scan. Next, air and approximately 250 cc of serous fluid was aspirated. The tube was connected to a pleura vac device and sutured in place. A dressing was applied. The patient tolerated the procedure well without immediate post procedural complication. IMPRESSION: Successful CT guided placement of a 10 [place] all purpose drain catheter into the apical aspects of right-sided hydropneumothorax with aspiration of air and approximately 250 cc of serous  fluid. Electronically Signed   By: Sandi Mariscal M.D.   On: 08/25/2022 09:45   DG CHEST PORT 1 VIEW  Result Date: 08/25/2022 CLINICAL DATA:  Right pneumothorax. Mediastinal masses, no malignancy. EXAM: PORTABLE CHEST 1 VIEW COMPARISON:  Chest radiographs 08/24/2022 at 12:25 p.m., 08/23/2022, 08/22/2022; CT chest 08/21/2022; CT-guided percutaneous chest tube placement 08/24/2022 at 3 p.m. FINDINGS: The patient underwent CT-guided percutaneous chest tube placement on the right 08/24/2022 at 3 p.m. Compared to 08/24/2022 at 12:25 p.m. chest radiograph, there is a new right-sided chest tube with pigtail overlying the lateral right hemithorax at mid height. Interval decrease in right pneumothorax, with the extrapleural air space measuring approximately 2.3 cm in craniocaudal dimension at the superior right hemithorax compared to 5.3 cm on 08/24/2022 prior to the chest tube placement. The prior layering right basilar air-fluid level is no longer visualized. No left pneumothorax. There are patchy heterogeneous airspace opacities throughout the right mid to lower lung and left perihilar region, similar to prior. Cardiac silhouette is normal in size. Right greater than left hilar fullness consistent with the mediastinal masses seen on prior CT. Right chest wall porta catheter tip overlies the central superior vena cava/right atrial junction. No acute skeletal abnormality. IMPRESSION:  Compared to 08/24/2022 at 12:25 p.m.: 1. New right-sided chest tube with pigtail overlying the lateral right hemithorax at mid height. 2. Interval decrease in right pneumothorax, with the extrapleural air space measuring approximately 2.3 cm in craniocaudal dimension at the superior right hemithorax compared to 5.3 cm on 08/24/2022 prior to the chest tube placement. Electronically Signed   By: Yvonne Kendall M.D.   On: 08/25/2022 08:35   DG Chest 2 View  Addendum Date: 08/24/2022   ADDENDUM REPORT: 08/24/2022 12:53 ADDENDUM: Findings were discussed with and acknowledged by Dr. Barb Merino. Electronically Signed   By: Sammie Bench M.D.   On: 08/24/2022 12:53   Result Date: 08/24/2022 CLINICAL DATA:  Follow up pneumothorax EXAM: CHEST - 2 VIEW COMPARISON:  08/23/2022 FINDINGS: Right-sided hydropneumothorax has increased in size and there is no evidence of shift of mediastinal structures to the left. There is alveolar consolidation both lungs consistent with edema. Cardiac silhouette is unremarkable. Right-sided Port-A-Cath tip superimposed with distal SVC. IMPRESSION: Right hydropneumothorax with an interval increase in size and new mediastinal shift. Diffuse alveolar consolidation lungs consistent with edema. Electronically Signed: By: Sammie Bench M.D. On: 08/24/2022 12:42   DG Chest 1 View  Result Date: 08/23/2022 CLINICAL DATA:  Right apical pneumothorax EXAM: CHEST  1 VIEW COMPARISON:  Chest radiograph dated 08/22/2022 FINDINGS: Lines/tubes: Right chest wall port tip projects over the superior cavoatrial junction. Chest: Increased right lung opacification. Minimal patchy and interstitial left mid lung opacities. Pleura: Slightly increased small right apical pneumothorax. Slightly increased trace right pleural effusion. Heart/mediastinum: Similar  cardiomediastinal silhouette. Bones: No acute osseous abnormality. IMPRESSION: 1. Slightly increased small right apical pneumothorax and trace right  pleural effusion. 2. Increased right lung opacification, which may represent atelectasis, pneumonia, or aspiration. Electronically Signed   By: Darrin Nipper M.D.   On: 08/23/2022 15:00   US THORACENTESIS ASP PLEURAL SPACE W/IMG GUIDE  Result Date: 08/22/2022 INDICATION: Lung cancer with right pleural effusion. Request for diagnostic and therapeutic thoracentesis. EXAM: ULTRASOUND GUIDED RIGHT THORACENTESIS MEDICATIONS: 1% lidocaine 4 mL COMPLICATIONS: SIR Level A - No therapy, no consequence. PROCEDURE: An ultrasound guided thoracentesis was thoroughly discussed with the patient and questions answered. The benefits, risks, alternatives and complications were also discussed. The patient understands and  wishes to proceed with the procedure. Written consent was obtained. Ultrasound was performed to localize and mark an adequate pocket of fluid in the right chest. The area was then prepped and draped in the normal sterile fashion. 1% Lidocaine was used for local anesthesia. Under ultrasound guidance a 6 Fr Safe-T-Centesis catheter was introduced. Thoracentesis was performed. The catheter was removed and a dressing applied. FINDINGS: A total of approximately 420 mL of clear amber fluid was removed. Samples were sent to the laboratory as requested by the clinical team. IMPRESSION: Successful ultrasound guided right thoracentesis yielding 420 mL of pleural fluid. Post-procedure chest x-ray shows small apical pneumothorax most likely consistent with incomplete re-expansion due to known lung carcinoma. We will repeat chest x-ray tomorrow morning to ensure stability. Procedure performed by: Gareth Eagle, PA-C Electronically Signed   By: Aletta Edouard M.D.   On: 08/22/2022 11:06   DG Chest 1 View  Result Date: 08/22/2022 CLINICAL DATA:  Status post right thoracentesis. EXAM: CHEST  1 VIEW COMPARISON:  08/21/2022 FINDINGS: Interval decreased right pleural effusion. Small right apical pneumothorax evident. Right suprahilar  and parahilar airspace disease again noted with patchy subtle parahilar opacities in the left lung. The visualized bony structures of the thorax are unremarkable. Telemetry leads overlie the chest. IMPRESSION: 1. Interval decreased right pleural effusion status post thoracentesis. Small right apical pneumothorax. 2. Right suprahilar and parahilar airspace disease with patchy subtle parahilar opacities in the left lung. These results will be called to the ordering clinician or representative by the Radiologist Assistant, and communication documented in the PACS or [employer]. Electronically Signed   By: Misty Stanley M.D.   On: 08/22/2022 10:28   CT Angio Chest PE W and/or Wo Contrast  Result Date: 08/21/2022 CLINICAL DATA:  Concern for pulmonary embolism. History hands cold post noted it cold EXAM: CT ANGIOGRAPHY CHEST WITH CONTRAST TECHNIQUE: Multidetector CT imaging of the chest was performed using the standard protocol during bolus administration of intravenous contrast. Multiplanar CT image reconstructions and MIPs were obtained to evaluate the vascular anatomy. RADIATION DOSE REDUCTION: This exam was performed according to the departmental dose-optimization program which includes automated exposure control, adjustment of the mA and/or kV according to patient size and/or use of iterative reconstruction technique. CONTRAST:  30mL OMNIPAQUE IOHEXOL 350 MG/ML SOLN COMPARISON:  Chest CT dated 06/25/2022 and radiograph dated 08/21/2022. FINDINGS: Evaluation of this exam is limited due to respiratory motion artifact. Cardiovascular: There is no cardiomegaly small pericardial effusion, new since the prior CT and measuring 1 cm in thickness. The thoracic aorta is unremarkable. The origins of the great vessels of the aortic arch appear patent. Evaluation of the pulmonary arteries is limited due to respiratory motion. No central pulmonary artery embolus identified. Mediastinum/Nodes: There is infiltrative soft  tissue the mediastinum and bilateral hila in keeping with known malignancy. No mediastinal fluid collection. Right-sided Port-A-Cath with tip at the cavoatrial junction. Lungs/Pleura: There is a moderate size right and trace left pleural effusions, new since the prior CT and suspicious for malignant effusion. There is partial compressive atelectasis of the right lower lobe. Patchy area of consolidation in the right perihilar region extending into the right upper lobe and right middle lobes may represent combination of tumor infiltration, atelectasis, or pneumonia. There is however diffuse interstitial thickening and nodularity primarily involving the upper lobes bilaterally as well as in the lower lobes suspicious for lymphangitic carcinomatosis. There is no pneumothorax. There is narrowing of the right upper lobe bronchus as well as  narrowing of the right middle and right lower lobe bronchi. The central airways remain patent. Upper Abdomen: Small calcified splenic granuloma. Musculoskeletal: Sclerotic lesions involving T10, T12, and L3 consistent with metastatic disease. No acute osseous pathology. Review of the MIP images confirms the above findings. IMPRESSION: 1. No CT evidence of central pulmonary artery embolus. 2. Moderate size right and trace left pleural effusions, new since the prior CT and suspicious for malignant effusion. 3. Infiltrative mediastinal and hilar mass in keeping with known malignancy. 4. Significant progression of disease with bilateral perihilar consolidation and findings 5. suspicious for lymphangitic carcinomatosis. 6. Sclerotic lesions involving T10, T12, and L3 consistent with metastatic disease. Electronically Signed   By: Anner Crete M.D.   On: 08/21/2022 20:53   DG Chest Port 1 View  Result Date: 08/21/2022 CLINICAL DATA:  Shortness of breath EXAM: PORTABLE CHEST 1 VIEW COMPARISON:  PET-CT dated July 29, 2022 and CT examination dated August 13, 2022 FINDINGS: The heart  is normal in size. Pulmonary vascular congestion with bilateral perihilar opacities consistent with patient's known hilar lymphadenopathy. Small bilateral pleural effusions. Right IJ access MediPort with distal tip in the SVC no acute osseous abnormality IMPRESSION: 1. Pulmonary vascular congestion with bilateral perihilar opacities consistent with patient's known hilar lymphadenopathy. 2. Small bilateral pleural effusions. Electronically Signed   By: Keane Police D.O.   On: 08/21/2022 19:59   VAS [place] LOWER EXTREMITY VENOUS (DVT)  Result Date: 08/17/2022  Lower Venous DVT Study Patient Name:  Karen [name]  Date of Exam:   08/17/2022 Medical Rec #: [id number]               Accession #:    [id number] Date of Birth: [birth year]                Patient Gender: F Patient Age:   71 years Exam Location:  [facility] Procedure:      VAS [place] LOWER EXTREMITY VENOUS (DVT) Referring Phys: Lottie Mussel --------------------------------------------------------------------------------  Other Indications: Stroke, primary lung adenocarcinoma. Comparison Study: No prior study. Performing Technologist: McKayla Maag RVT, [place]  Examination Guidelines: A complete evaluation includes B-mode imaging, spectral Doppler, color Doppler, and power Doppler as needed of all accessible portions of each vessel. Bilateral testing is considered an integral part of a complete examination. Limited examinations for reoccurring indications may be performed as noted. The reflux portion of the exam is performed with the patient in reverse Trendelenburg.  +---------+---------------+---------+-----------+----------+--------------+ RIGHT    CompressibilityPhasicitySpontaneityPropertiesThrombus Aging +---------+---------------+---------+-----------+----------+--------------+ CFV      Full           Yes      Yes                                 +---------+---------------+---------+-----------+----------+--------------+ SFJ      Full                                                         +---------+---------------+---------+-----------+----------+--------------+ FV Prox  Full                                                        +---------+---------------+---------+-----------+----------+--------------+  FV Mid   Full                                                        +---------+---------------+---------+-----------+----------+--------------+ FV DistalFull                                                        +---------+---------------+---------+-----------+----------+--------------+ PFV      Full                                                        +---------+---------------+---------+-----------+----------+--------------+ POP      Full           Yes      Yes                                 +---------+---------------+---------+-----------+----------+--------------+ PTV      Full                                                        +---------+---------------+---------+-----------+----------+--------------+ PERO     Full                                                        +---------+---------------+---------+-----------+----------+--------------+   +---------+---------------+---------+-----------+----------+--------------+ LEFT     CompressibilityPhasicitySpontaneityPropertiesThrombus Aging +---------+---------------+---------+-----------+----------+--------------+ CFV      Full           Yes      Yes                                 +---------+---------------+---------+-----------+----------+--------------+ SFJ      Full                                                        +---------+---------------+---------+-----------+----------+--------------+ FV Prox  Full                                                        +---------+---------------+---------+-----------+----------+--------------+ FV Mid   Full                                                         +---------+---------------+---------+-----------+----------+--------------+  FV DistalFull                                                        +---------+---------------+---------+-----------+----------+--------------+ PFV      Full                                                        +---------+---------------+---------+-----------+----------+--------------+ POP      Full           Yes      Yes                                 +---------+---------------+---------+-----------+----------+--------------+ PTV      Full                                                        +---------+---------------+---------+-----------+----------+--------------+ PERO     Full                                                        +---------+---------------+---------+-----------+----------+--------------+     Summary: BILATERAL: - No evidence of deep vein thrombosis seen in the lower extremities, bilaterally. - No evidence of superficial venous thrombosis in the lower extremities, bilaterally. -No evidence of popliteal cyst, bilaterally.   *See table(s) above for measurements and observations. Electronically signed by Deitra Mayo MD on 08/17/2022 at 7:34:49 PM.    Final    IR IMAGING GUIDED PORT INSERTION  Result Date: 08/17/2022 INDICATION: Chemotherapy access EXAM: Chest port placement using ultrasound and fluoroscopic guidance MEDICATIONS: Documented in the EMR ANESTHESIA/SEDATION: Moderate (conscious) sedation was employed during this procedure. A total of Versed 2 mg and Fentanyl 100 mcg was administered intravenously. Moderate Sedation Time: 32 minutes. The patient's level of consciousness and vital signs were monitored continuously by radiology nursing throughout the procedure under my direct supervision. FLUOROSCOPY TIME:  Fluoroscopy Time: 0.1 minutes (1 mGy) COMPLICATIONS: None immediate. PROCEDURE: Informed written consent was obtained from the patient after a  thorough discussion of the procedural risks, benefits and alternatives. All questions were addressed. Maximal Sterile Barrier Technique was utilized including caps, mask, sterile gowns, sterile gloves, sterile drape, hand hygiene and skin antiseptic. A timeout was performed prior to the initiation of the procedure. The patient was placed supine on the exam table. The right neck and chest was prepped and draped in the standard sterile fashion. A preliminary ultrasound of the right neck was performed and demonstrates a patent right internal jugular vein. A permanent ultrasound image was stored in the electronic medical record. The overlying skin was anesthetized with 1% Lidocaine. Using ultrasound guidance, access was obtained into the right internal jugular vein using a 21 gauge micropuncture set. A wire was advanced into the SVC, a short incision was made at the  puncture site, and serial dilatation performed. Next, in an ipsilateral infraclavicular location, an incision was made at the site of the subcutaneous reservoir. Blunt dissection was used to open a pocket to contain the reservoir. A subcutaneous tunnel was then created from the port site to the puncture site. A(n) 8 Fr single lumen catheter was advanced through the tunnel. The catheter was attached to the port and this was placed in the subcutaneous pocket. Under fluoroscopic guidance, a peel away sheath was placed, and the catheter was trimmed to the appropriate length and was advanced into the central veins. The catheter length is 23 cm. The tip of the catheter lies near the superior cavoatrial junction. The port flushes and aspirates appropriately. The port was flushed and locked with heparinized saline. The port pocket was closed in 2 layers using 3-0 and 4-0 Vicryl/absorbable suture. Dermabond was also applied to both incisions. The patient tolerated the procedure well and was transferred to recovery in stable condition. IMPRESSION: Successful placement  of a right-sided chest port via the right internal jugular vein. The port is ready for immediate use. Electronically Signed   By: Albin Felling M.D.   On: 08/17/2022 16:49   ECHO TEE  Result Date: 08/17/2022    TRANSESOPHOGEAL ECHO REPORT   Patient Name:   Karen [name] Date of Exam: 08/17/2022 Medical Rec #:  [id number]              Height:       62.0 in Accession #:    [id number]             Weight:       98.1 lb Date of Birth:  Feb 15, 1966               BSA:          1.412 m Patient Age:    75 years               BP:           119/82 mmHg Patient Gender: F                      HR:           88 bpm. Exam Location:  Inpatient Procedure: Transesophageal Echo, Color Doppler, Cardiac Doppler and Saline            Contrast Bubble Study Indications:     Stroke  History:         Patient has prior history of Echocardiogram examinations, most                  recent 08/14/2022. Risk Factors:Hypertension. Latent TB,                  metastatic lung adenocarcinoma.  Sonographer:     Eartha Inch Referring Phys:  [id number] Margie Billet Diagnosing Phys: Oswaldo Milian MD PROCEDURE: After discussion of the risks and benefits of a TEE, an informed consent was obtained from the patient. The transesophogeal probe was passed without difficulty through the esophogus of the patient. Imaged were obtained with the patient in a left lateral decubitus position. Sedation performed by different physician. The patient was monitored while under deep sedation. Image quality was good. The patient's vital signs; including heart rate, blood pressure, and oxygen saturation; remained stable throughout the procedure. The patient developed no complications during the procedure.  IMPRESSIONS  1. Left ventricular ejection fraction, by estimation, is 60 to 65%. The  left ventricle has normal function.  2. Right ventricular systolic function is normal. The right ventricular size is normal.  3. No left atrial/left atrial appendage thrombus  was detected.  4. Moderate pericardial effusion. Moderate pleural effusion.  5. The mitral valve is normal in structure. Trivial mitral valve regurgitation.  6. Tricuspid valve regurgitation is mild to moderate.  7. The aortic valve is tricuspid. Aortic valve regurgitation is not visualized. No aortic stenosis is present.  8. Evidence of atrial level shunting detected by color flow Doppler. Agitated saline contrast bubble study was positive with shunting observed after >6 cardiac cycles suggestive of intrapulmonary shunting. Conclusion(s)/Recommendation(s): No intracardiac source of embolism detected on this on this transesophageal echocardiogram. FINDINGS  Left Ventricle: Left ventricular ejection fraction, by estimation, is 60 to 65%. The left ventricle has normal function. The left ventricular internal cavity size was normal in size. Right Ventricle: The right ventricular size is normal. No increase in right ventricular wall thickness. Right ventricular systolic function is normal. Left Atrium: Left atrial size was normal in size. No left atrial/left atrial appendage thrombus was detected. Right Atrium: Right atrial size was normal in size. Pericardium: A moderately sized pericardial effusion is present. Mitral Valve: The mitral valve is normal in structure. Trivial mitral valve regurgitation. Tricuspid Valve: The tricuspid valve is normal in structure. Tricuspid valve regurgitation is mild to moderate. Aortic Valve: The aortic valve is tricuspid. Aortic valve regurgitation is not visualized. No aortic stenosis is present. Pulmonic Valve: The pulmonic valve was not well visualized. Pulmonic valve regurgitation is trivial. Aorta: The aortic root and ascending aorta are structurally normal, with no evidence of dilitation. IAS/Shunts: Evidence of atrial level shunting detected by color flow Doppler. Agitated saline contrast was given intravenously to evaluate for intracardiac shunting. Agitated saline contrast  bubble study was positive with shunting observed after >6 cardiac cycles suggestive of intrapulmonary shunting. Additional Comments: There is a moderate pleural effusion. Spectral Doppler performed. TRICUSPID VALVE TR Peak grad:   31.6 mmHg TR Mean grad:   21.0 mmHg TR Vmax:        281.00 cm/s TR Vmean:       218.0 cm/s Oswaldo Milian MD Electronically signed by Oswaldo Milian MD Signature Date/Time: 08/17/2022/1:59:41 PM    Final (Updated)    ECHOCARDIOGRAM COMPLETE  Result Date: 08/14/2022    ECHOCARDIOGRAM REPORT   Patient Name:   Karen [name] Date of Exam: 08/14/2022 Medical Rec #:  [id number]              Height:       62.0 in Accession #:    [id number]             Weight:       98.0 lb Date of Birth:  1966-01-17               BSA:          1.412 m Patient Age:    27 years               BP:           134/68 mmHg Patient Gender: F                      HR:           74 bpm. Exam Location:  Inpatient Procedure: 2D Echo, Cardiac Doppler and Color Doppler Indications:    I63.9 Stroke  History:        Patient  has prior history of Echocardiogram examinations, most                 recent 05/29/2021. Stroke; Risk Factors:Non-Smoker and                 Hypertension.  Sonographer:    Wilkie Aye RVT RCS Referring Phys: [id number] EMILY B MULLEN  Sonographer Comments: Some images are off axis IMPRESSIONS  1. Left ventricular ejection fraction, by estimation, is 65 to 70%. The left ventricle has normal function. The left ventricle has no regional wall motion abnormalities. Left ventricular diastolic parameters are consistent with Grade I diastolic dysfunction (impaired relaxation).  2. Right ventricular systolic function is normal. The right ventricular size is normal.  3. Moderate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

## 2022-09-08 NOTE — Patient Instructions (Signed)

## 2022-09-08 NOTE — Transitions of Care (Post Inpatient/ED Visit) (Signed)
   09/08/2022  Name: Karen [name] MRN: [id number] DOB: December 14, 1965  Today's TOC FU Call Status: Today's TOC FU Call Status:: Successful TOC FU Call Competed TOC FU Call Complete Date: 09/08/22  Transition Care Management Follow-up Telephone Call Date of Discharge: 09/07/22 Discharge Facility: [facility]) Type of Discharge: Inpatient Admission Primary Inpatient Discharge Diagnosis:: lung cancer How have you been since you were released from the hospital?: Same Any questions or concerns?: No  Items Reviewed: Did you receive and understand the discharge instructions provided?: Yes Medications obtained and verified?: Yes (Medications Reviewed) Any new allergies since your discharge?: No Dietary orders reviewed?: Yes Do you have support at home?: Yes People in Home: spouse  Home Care and Equipment/Supplies: [place] Ordered?: NA Any new equipment or medical supplies ordered?: NA  Functional Questionnaire: Do you need assistance with bathing/showering or dressing?: Yes Do you need assistance with meal preparation?: Yes Do you need assistance with eating?: No Do you have difficulty maintaining continence: No Do you need assistance with getting out of bed/getting out of a chair/moving?: Yes Do you have difficulty managing or taking your medications?: No  Follow up appointments reviewed: PCP Follow-up appointment confirmed?: NA MD Provider Line Number:[phone] Given: No Specialist Hospital Follow-up appointment confirmed?: Yes Date of Specialist follow-up appointment?: 09/08/22 Follow-Up Specialty Provider:: Oncology Do you need transportation to your follow-up appointment?: No Do you understand care options if your condition(s) worsen?: Yes-patient verbalized understanding    SIGNATURE Juanda Crumble, [place] Nurse Health Advisor Direct Dial [phone]

## 2022-09-09 ENCOUNTER — Encounter: Payer: Self-pay | Admitting: Hematology

## 2022-09-09 ENCOUNTER — Inpatient Hospital Stay (HOSPITAL_COMMUNITY)
Admission: EM | Admit: 2022-09-09 | Discharge: 2022-09-13 | DRG: 392 | Disposition: A | Discharge status: Home / Self Care | Payer: Commercial Managed Care - HMO | Attending: Internal Medicine | Admitting: Internal Medicine

## 2022-09-09 ENCOUNTER — Emergency Department (HOSPITAL_COMMUNITY): Payer: Commercial Managed Care - HMO

## 2022-09-09 ENCOUNTER — Telehealth: Payer: Self-pay | Admitting: Physician Assistant

## 2022-09-09 ENCOUNTER — Encounter (HOSPITAL_COMMUNITY): Payer: Self-pay

## 2022-09-09 ENCOUNTER — Observation Stay (HOSPITAL_COMMUNITY): Payer: Commercial Managed Care - HMO

## 2022-09-09 DIAGNOSIS — R112 Nausea with vomiting, unspecified: Secondary | ICD-10-CM | POA: Diagnosis not present

## 2022-09-09 DIAGNOSIS — E785 Hyperlipidemia, unspecified: Secondary | ICD-10-CM | POA: Diagnosis present

## 2022-09-09 DIAGNOSIS — G893 Neoplasm related pain (acute) (chronic): Secondary | ICD-10-CM

## 2022-09-09 DIAGNOSIS — C3490 Malignant neoplasm of unspecified part of unspecified bronchus or lung: Secondary | ICD-10-CM | POA: Diagnosis present

## 2022-09-09 DIAGNOSIS — Z515 Encounter for palliative care: Secondary | ICD-10-CM

## 2022-09-09 DIAGNOSIS — I3131 Malignant pericardial effusion in diseases classified elsewhere: Secondary | ICD-10-CM | POA: Diagnosis present

## 2022-09-09 DIAGNOSIS — F32A Depression, unspecified: Secondary | ICD-10-CM | POA: Diagnosis present

## 2022-09-09 DIAGNOSIS — J90 Pleural effusion, not elsewhere classified: Secondary | ICD-10-CM | POA: Diagnosis present

## 2022-09-09 DIAGNOSIS — F419 Anxiety disorder, unspecified: Secondary | ICD-10-CM | POA: Diagnosis present

## 2022-09-09 DIAGNOSIS — R7989 Other specified abnormal findings of blood chemistry: Secondary | ICD-10-CM | POA: Diagnosis present

## 2022-09-09 DIAGNOSIS — D75839 Thrombocytosis, unspecified: Secondary | ICD-10-CM | POA: Diagnosis present

## 2022-09-09 DIAGNOSIS — E441 Mild protein-calorie malnutrition: Secondary | ICD-10-CM | POA: Diagnosis present

## 2022-09-09 LAB — COMPREHENSIVE METABOLIC PANEL
ALT: 52 U/L — ABNORMAL HIGH (ref 0–44)
AST: 46 U/L — ABNORMAL HIGH (ref 15–41)
Albumin: 3.2 g/dL — ABNORMAL LOW (ref 3.5–5.0)
Alkaline Phosphatase: 465 U/L — ABNORMAL HIGH (ref 38–126)
Anion gap: 9 (ref 5–15)
BUN: 6 mg/dL (ref 6–20)
CO2: 15 mmol/L — ABNORMAL LOW (ref 22–32)
Calcium: 7.9 mg/dL — ABNORMAL LOW (ref 8.9–10.3)
Chloride: 110 mmol/L (ref 98–111)
Creatinine, Ser: 0.5 mg/dL (ref 0.44–1.00)
GFR, Estimated: >60 mL/min (ref >60)
Glucose, Bld: 109 mg/dL — ABNORMAL HIGH (ref 70–99)
Potassium: 3.5 mmol/L (ref 3.5–5.1)
Sodium: 134 mmol/L — ABNORMAL LOW (ref 135–145)
Total Bilirubin: 0.6 mg/dL (ref 0.3–1.2)
Total Protein: 6.1 g/dL — ABNORMAL LOW (ref 6.5–8.1)

## 2022-09-09 LAB — CBC WITH DIFFERENTIAL/PLATELET
Abs Immature Granulocytes: 0 10*3/uL (ref 0.00–0.07)
Basophils Absolute: 0 10*3/uL (ref 0.0–0.1)
Basophils Relative: 1 %
Eosinophils Absolute: 0.1 10*3/uL (ref 0.0–0.5)
Eosinophils Relative: 2 %
HCT: 34.2 % — ABNORMAL LOW (ref 36.0–46.0)
Hemoglobin: 11.7 g/dL — ABNORMAL LOW (ref 12.0–15.0)
Immature Granulocytes: 0 %
Lymphocytes Relative: 44 %
Lymphs Abs: 1.9 10*3/uL (ref 0.7–4.0)
MCH: 31.2 pg (ref 26.0–34.0)
MCHC: 34.2 g/dL (ref 30.0–36.0)
MCV: 91.2 fL (ref 80.0–100.0)
Monocytes Absolute: 0.3 10*3/uL (ref 0.1–1.0)
Monocytes Relative: 8 %
Neutro Abs: 1.9 10*3/uL (ref 1.7–7.7)
Neutrophils Relative %: 45 %
Platelets: 532 10*3/uL — ABNORMAL HIGH (ref 150–400)
RBC: 3.75 MIL/uL — ABNORMAL LOW (ref 3.87–5.11)
RDW: 15.2 % (ref 11.5–15.5)
WBC: 4.3 10*3/uL (ref 4.0–10.5)
nRBC: 0 % (ref 0.0–0.2)

## 2022-09-09 LAB — MAGNESIUM: Magnesium: 1.9 mg/dL (ref 1.7–2.4)

## 2022-09-09 LAB — PHOSPHORUS: Phosphorus: 1.2 mg/dL — ABNORMAL LOW (ref 2.5–4.6)

## 2022-09-09 LAB — LACTIC ACID, PLASMA: Lactic Acid, Venous: 1.8 mmol/L (ref 0.5–1.9)

## 2022-09-09 LAB — LIPASE, BLOOD: Lipase: 47 U/L (ref 11–51)

## 2022-09-09 MED ORDER — FENTANYL 25 MCG/HR TD PT72
1.0000 | MEDICATED_PATCH | TRANSDERMAL | Status: DC
  Administered 2022-09-09 – 2022-09-12 (×2): 1 via TRANSDERMAL
  Filled 2022-09-09 (×2): qty 1

## 2022-09-09 MED ORDER — ENOXAPARIN SODIUM 30 MG/0.3ML IJ SOSY
30.0000 mg | PREFILLED_SYRINGE | INTRAMUSCULAR | Status: DC
  Administered 2022-09-09 – 2022-09-12 (×4): 30 mg via SUBCUTANEOUS
  Filled 2022-09-09 (×4): qty 0.3

## 2022-09-09 MED ORDER — PROCHLORPERAZINE EDISYLATE 10 MG/2ML IJ SOLN
10.0000 mg | Freq: Once | INTRAMUSCULAR | Status: AC
  Administered 2022-09-09: 10 mg via INTRAVENOUS
  Filled 2022-09-09: qty 2

## 2022-09-09 MED ORDER — METOCLOPRAMIDE HCL 5 MG/ML IJ SOLN
5.0000 mg | Freq: Four times a day (QID) | INTRAMUSCULAR | Status: DC
  Administered 2022-09-09 – 2022-09-12 (×12): 5 mg via INTRAVENOUS
  Filled 2022-09-09 (×12): qty 2

## 2022-09-09 MED ORDER — IOHEXOL 300 MG/ML  SOLN
100.0000 mL | Freq: Once | INTRAMUSCULAR | Status: AC | PRN
  Administered 2022-09-09: 100 mL via INTRAVENOUS

## 2022-09-09 MED ORDER — DIAZEPAM 5 MG/ML IJ SOLN
2.5000 mg | Freq: Once | INTRAMUSCULAR | Status: AC
  Administered 2022-09-09: 2.5 mg via INTRAVENOUS
  Filled 2022-09-09: qty 2

## 2022-09-09 MED ORDER — POTASSIUM PHOSPHATES 15 MMOLE/5ML IV SOLN
30.0000 mmol | Freq: Once | INTRAVENOUS | Status: AC
  Administered 2022-09-09: 30 mmol via INTRAVENOUS
  Filled 2022-09-09: qty 10

## 2022-09-09 MED ORDER — ONDANSETRON HCL 4 MG/2ML IJ SOLN
4.0000 mg | Freq: Four times a day (QID) | INTRAMUSCULAR | Status: DC | PRN
  Administered 2022-09-09 – 2022-09-11 (×4): 4 mg via INTRAVENOUS
  Filled 2022-09-09 (×4): qty 2

## 2022-09-09 MED ORDER — FENTANYL CITRATE PF 50 MCG/ML IJ SOSY: 50.0000 ug | PREFILLED_SYRINGE | INTRAMUSCULAR | Status: DC | PRN

## 2022-09-09 MED ORDER — SODIUM CHLORIDE 0.9 % IV BOLUS
1000.0000 mL | Freq: Once | INTRAVENOUS | Status: AC
  Administered 2022-09-09: 1000 mL via INTRAVENOUS

## 2022-09-09 MED ORDER — PROCHLORPERAZINE EDISYLATE 10 MG/2ML IJ SOLN
5.0000 mg | INTRAMUSCULAR | Status: DC | PRN
  Administered 2022-09-09 – 2022-09-11 (×5): 5 mg via INTRAVENOUS
  Filled 2022-09-09 (×5): qty 2

## 2022-09-09 MED ORDER — SODIUM CHLORIDE 0.9 % IV SOLN: INTRAVENOUS | Status: DC

## 2022-09-09 MED ORDER — PANTOPRAZOLE SODIUM 40 MG IV SOLR
40.0000 mg | Freq: Once | INTRAVENOUS | Status: AC
  Administered 2022-09-09: 40 mg via INTRAVENOUS
  Filled 2022-09-09: qty 10

## 2022-09-09 MED ORDER — LIDOCAINE HCL 1 % IJ SOLN
INTRAMUSCULAR | Status: AC
  Administered 2022-09-09: 15 mL
  Filled 2022-09-09: qty 20

## 2022-09-09 MED ORDER — FENTANYL CITRATE PF 50 MCG/ML IJ SOSY
50.0000 ug | PREFILLED_SYRINGE | Freq: Once | INTRAMUSCULAR | Status: AC
  Administered 2022-09-09: 50 ug via INTRAVENOUS
  Filled 2022-09-09: qty 1

## 2022-09-09 MED ORDER — SODIUM CHLORIDE (PF) 0.9 % IJ SOLN
INTRAMUSCULAR | Status: AC
  Filled 2022-09-09: qty 50

## 2022-09-09 MED ORDER — ONDANSETRON HCL 4 MG PO TABS
4.0000 mg | ORAL_TABLET | Freq: Four times a day (QID) | ORAL | Status: DC | PRN
  Administered 2022-09-12: 4 mg via ORAL
  Filled 2022-09-09: qty 1

## 2022-09-09 MED ORDER — LACTATED RINGERS IV SOLN: INTRAVENOUS | Status: DC

## 2022-09-09 MED ORDER — POTASSIUM CHLORIDE 10 MEQ/100ML IV SOLN
10.0000 meq | INTRAVENOUS | Status: AC
  Administered 2022-09-09 (×2): 10 meq via INTRAVENOUS
  Filled 2022-09-09 (×2): qty 100

## 2022-09-09 MED ORDER — ONDANSETRON HCL 4 MG/2ML IJ SOLN
4.0000 mg | Freq: Once | INTRAMUSCULAR | Status: AC
  Administered 2022-09-09: 4 mg via INTRAVENOUS
  Filled 2022-09-09: qty 2

## 2022-09-09 MED ORDER — DIAZEPAM 5 MG/ML IJ SOLN
5.0000 mg | Freq: Four times a day (QID) | INTRAMUSCULAR | Status: DC | PRN
  Administered 2022-09-09 – 2022-09-10 (×3): 5 mg via INTRAVENOUS
  Filled 2022-09-09 (×3): qty 2

## 2022-09-09 NOTE — ED Triage Notes (Signed)
Brought in by ambulance for n/v. Pt released yesterday for the same thing.
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
## 2022-09-09 NOTE — Telephone Encounter (Signed)
Per 3/20 LOS reached out to patient to schedule follow up per Murray Hodgkins added patient to 11:45am. Left voicemail for patient
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
## 2022-09-09 NOTE — Procedures (Signed)
Ultrasound-guided  therapeutic right  thoracentesis performed yielding 600 cc  of yellow  fluid. No immediate complications. Follow-up chest x-ray pending.  EBL none.

## 2022-09-09 NOTE — ED Provider Notes (Signed)
Patient here with ongoing nausea and vomiting.  Undergoing chemotherapy for adenocarcinoma with metastasis.  She has a bicarb of 15 likely from hydration.  On my reevaluation she is still very symptomatic.  Still having active nausea and vomiting.  She is got multiple rounds of antiemetics with not much improvement.  CT scan overall unremarkable.  She is got a chronic effusion.  Shared decision was made to admit her for observation stay for further hydration, nausea care and to make sure she can tolerate a diet.  She has not been able to hold things down for about 24+ hours.  Will admit to medicine for further care.  This chart was dictated using voice recognition software.  Despite best efforts to proofread,  errors can occur which can change the documentation meaning.    Lennice Sites, DO 09/09/22 [phone]

## 2022-09-10 ENCOUNTER — Telehealth: Payer: Self-pay | Admitting: Hematology

## 2022-09-10 ENCOUNTER — Observation Stay (HOSPITAL_COMMUNITY): Payer: Commercial Managed Care - HMO

## 2022-09-10 ENCOUNTER — Encounter: Payer: Self-pay | Admitting: Hematology

## 2022-09-10 DIAGNOSIS — J910 Malignant pleural effusion: Secondary | ICD-10-CM | POA: Diagnosis present

## 2022-09-10 DIAGNOSIS — R748 Abnormal levels of other serum enzymes: Secondary | ICD-10-CM | POA: Diagnosis present

## 2022-09-10 DIAGNOSIS — D630 Anemia in neoplastic disease: Secondary | ICD-10-CM | POA: Diagnosis present

## 2022-09-10 DIAGNOSIS — J90 Pleural effusion, not elsewhere classified: Secondary | ICD-10-CM | POA: Diagnosis not present

## 2022-09-10 DIAGNOSIS — I3139 Other pericardial effusion (noninflammatory): Secondary | ICD-10-CM

## 2022-09-10 DIAGNOSIS — Y92009 Unspecified place in unspecified non-institutional (private) residence as the place of occurrence of the external cause: Secondary | ICD-10-CM | POA: Diagnosis not present

## 2022-09-10 DIAGNOSIS — C7951 Secondary malignant neoplasm of bone: Secondary | ICD-10-CM | POA: Diagnosis present

## 2022-09-10 DIAGNOSIS — E872 Acidosis, unspecified: Secondary | ICD-10-CM | POA: Diagnosis present

## 2022-09-10 DIAGNOSIS — R112 Nausea with vomiting, unspecified: Secondary | ICD-10-CM | POA: Diagnosis present

## 2022-09-10 DIAGNOSIS — K5900 Constipation, unspecified: Secondary | ICD-10-CM | POA: Diagnosis present

## 2022-09-10 DIAGNOSIS — E860 Dehydration: Secondary | ICD-10-CM | POA: Diagnosis present

## 2022-09-10 DIAGNOSIS — D75839 Thrombocytosis, unspecified: Secondary | ICD-10-CM | POA: Diagnosis present

## 2022-09-10 DIAGNOSIS — R627 Adult failure to thrive: Secondary | ICD-10-CM | POA: Diagnosis present

## 2022-09-10 DIAGNOSIS — F419 Anxiety disorder, unspecified: Secondary | ICD-10-CM | POA: Diagnosis present

## 2022-09-10 DIAGNOSIS — E785 Hyperlipidemia, unspecified: Secondary | ICD-10-CM | POA: Diagnosis present

## 2022-09-10 DIAGNOSIS — E876 Hypokalemia: Secondary | ICD-10-CM | POA: Diagnosis present

## 2022-09-10 DIAGNOSIS — E441 Mild protein-calorie malnutrition: Secondary | ICD-10-CM | POA: Diagnosis present

## 2022-09-10 DIAGNOSIS — K529 Noninfective gastroenteritis and colitis, unspecified: Secondary | ICD-10-CM | POA: Diagnosis present

## 2022-09-10 DIAGNOSIS — C3490 Malignant neoplasm of unspecified part of unspecified bronchus or lung: Secondary | ICD-10-CM | POA: Diagnosis present

## 2022-09-10 DIAGNOSIS — K2970 Gastritis, unspecified, without bleeding: Secondary | ICD-10-CM | POA: Diagnosis present

## 2022-09-10 DIAGNOSIS — I10 Essential (primary) hypertension: Secondary | ICD-10-CM | POA: Diagnosis present

## 2022-09-10 DIAGNOSIS — R188 Other ascites: Secondary | ICD-10-CM | POA: Diagnosis present

## 2022-09-10 DIAGNOSIS — Z681 Body mass index (BMI) 19 or less, adult: Secondary | ICD-10-CM | POA: Diagnosis not present

## 2022-09-10 DIAGNOSIS — I3131 Malignant pericardial effusion in diseases classified elsewhere: Secondary | ICD-10-CM | POA: Diagnosis present

## 2022-09-10 DIAGNOSIS — B370 Candidal stomatitis: Secondary | ICD-10-CM | POA: Diagnosis present

## 2022-09-10 DIAGNOSIS — F32A Depression, unspecified: Secondary | ICD-10-CM | POA: Diagnosis present

## 2022-09-10 LAB — CBC
HCT: 31.2 % — ABNORMAL LOW (ref 36.0–46.0)
Hemoglobin: 10.6 g/dL — ABNORMAL LOW (ref 12.0–15.0)
MCH: 31.8 pg (ref 26.0–34.0)
MCHC: 34 g/dL (ref 30.0–36.0)
MCV: 93.7 fL (ref 80.0–100.0)
Platelets: 489 10*3/uL — ABNORMAL HIGH (ref 150–400)
RBC: 3.33 MIL/uL — ABNORMAL LOW (ref 3.87–5.11)
RDW: 15.1 % (ref 11.5–15.5)
WBC: 4 10*3/uL (ref 4.0–10.5)
nRBC: 0 % (ref 0.0–0.2)

## 2022-09-10 LAB — ECHOCARDIOGRAM LIMITED
Est EF: >75
Height: 62 in
S' Lateral: 1.9 cm
Weight: 1580.79 oz

## 2022-09-10 LAB — COMPREHENSIVE METABOLIC PANEL
ALT: 45 U/L — ABNORMAL HIGH (ref 0–44)
AST: 35 U/L (ref 15–41)
Albumin: 3.4 g/dL — ABNORMAL LOW (ref 3.5–5.0)
Alkaline Phosphatase: 424 U/L — ABNORMAL HIGH (ref 38–126)
Anion gap: 7 (ref 5–15)
BUN: <5 mg/dL — ABNORMAL LOW (ref 6–20)
CO2: 19 mmol/L — ABNORMAL LOW (ref 22–32)
Calcium: 6.9 mg/dL — ABNORMAL LOW (ref 8.9–10.3)
Chloride: 105 mmol/L (ref 98–111)
Creatinine, Ser: 0.48 mg/dL (ref 0.44–1.00)
GFR, Estimated: >60 mL/min (ref >60)
Glucose, Bld: 120 mg/dL — ABNORMAL HIGH (ref 70–99)
Potassium: 3.4 mmol/L — ABNORMAL LOW (ref 3.5–5.1)
Sodium: 131 mmol/L — ABNORMAL LOW (ref 135–145)
Total Bilirubin: 0.4 mg/dL (ref 0.3–1.2)
Total Protein: 5.9 g/dL — ABNORMAL LOW (ref 6.5–8.1)

## 2022-09-10 LAB — MAGNESIUM: Magnesium: 1.6 mg/dL — ABNORMAL LOW (ref 1.7–2.4)

## 2022-09-10 LAB — PHOSPHORUS: Phosphorus: 2.1 mg/dL — ABNORMAL LOW (ref 2.5–4.6)

## 2022-09-10 MED ORDER — POTASSIUM CHLORIDE 10 MEQ/100ML IV SOLN
10.0000 meq | INTRAVENOUS | Status: AC
  Administered 2022-09-10 (×3): 10 meq via INTRAVENOUS
  Filled 2022-09-10 (×3): qty 100

## 2022-09-10 MED ORDER — SERTRALINE HCL 100 MG PO TABS
100.0000 mg | ORAL_TABLET | Freq: Two times a day (BID) | ORAL | Status: DC
  Administered 2022-09-10 – 2022-09-13 (×7): 100 mg via ORAL
  Filled 2022-09-10 (×7): qty 1

## 2022-09-10 MED ORDER — FLUCONAZOLE IN SODIUM CHLORIDE 200-0.9 MG/100ML-% IV SOLN
200.0000 mg | Freq: Every day | INTRAVENOUS | Status: DC
  Administered 2022-09-10 – 2022-09-12 (×3): 200 mg via INTRAVENOUS
  Filled 2022-09-10 (×3): qty 100

## 2022-09-10 MED ORDER — PANTOPRAZOLE SODIUM 40 MG IV SOLR
40.0000 mg | Freq: Two times a day (BID) | INTRAVENOUS | Status: DC
  Administered 2022-09-10 – 2022-09-12 (×5): 40 mg via INTRAVENOUS
  Filled 2022-09-10 (×5): qty 10

## 2022-09-10 MED ORDER — POLYETHYLENE GLYCOL 3350 17 G PO PACK: 17.0000 g | PACK | Freq: Every day | ORAL | Status: DC

## 2022-09-10 MED ORDER — OLANZAPINE 5 MG PO TBDP: 10.0000 mg | ORAL_TABLET | Freq: Every day | ORAL | Status: DC

## 2022-09-10 MED ORDER — OLANZAPINE 5 MG PO TBDP: 5.0000 mg | ORAL_TABLET | Freq: Every day | ORAL | Status: DC

## 2022-09-10 MED ORDER — MAGNESIUM SULFATE 2 GM/50ML IV SOLN
2.0000 g | Freq: Once | INTRAVENOUS | Status: AC
  Administered 2022-09-10: 2 g via INTRAVENOUS
  Filled 2022-09-10: qty 50

## 2022-09-10 MED ORDER — NYSTATIN 100000 UNIT/ML MT SUSP
5.0000 mL | Freq: Four times a day (QID) | OROMUCOSAL | Status: DC
  Administered 2022-09-10 – 2022-09-13 (×14): 500000 [IU] via ORAL
  Filled 2022-09-10 (×15): qty 5

## 2022-09-10 MED ORDER — SODIUM CHLORIDE 0.9 % IV SOLN
3.0000 g | Freq: Three times a day (TID) | INTRAVENOUS | Status: DC
  Administered 2022-09-10 – 2022-09-13 (×10): 3 g via INTRAVENOUS
  Filled 2022-09-10 (×11): qty 8

## 2022-09-10 MED ORDER — LORAZEPAM 2 MG/ML IJ SOLN
1.0000 mg | Freq: Four times a day (QID) | INTRAMUSCULAR | Status: DC | PRN
  Administered 2022-09-10 – 2022-09-12 (×5): 1 mg via INTRAVENOUS
  Filled 2022-09-10 (×5): qty 1

## 2022-09-10 MED ORDER — POTASSIUM CHLORIDE CRYS ER 20 MEQ PO TBCR: 40.0000 meq | EXTENDED_RELEASE_TABLET | Freq: Once | ORAL | Status: DC

## 2022-09-10 MED ORDER — POTASSIUM PHOSPHATES 15 MMOLE/5ML IV SOLN
15.0000 mmol | Freq: Once | INTRAVENOUS | Status: AC
  Administered 2022-09-10: 15 mmol via INTRAVENOUS
  Filled 2022-09-10: qty 5

## 2022-09-10 NOTE — Progress Notes (Signed)
Patient refused to have another IV placed at this time for medication administration.

## 2022-09-10 NOTE — Progress Notes (Addendum)
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 found to have lung adenocarcinoma with bone mets 06/2022.  She was admitted 07/2022 with CVA.  Echo 08/14/2022 showed EF 65 to 70%, moderate pericardial effusion, no evidence of tamponade.  Suspected to be malignant effusion.  TEE was done given CVA,  showed no cardiac source of embolism.  She presented to ED 09/09/2022 with nausea, vomiting, and abdominal pain.  She had undergone her first round of chemotherapy.  In ED, initial vital signs showed BP 135/90, pulse 106, SpO2 99% on room air.  Labs notable for sodium 134, creatinine 0.5, alk phos 465 AST 46, ALT 52, WBC 4.3, lactate 1.8, hemoglobin 11.7, platelets 532.  EKG shows sinus tachycardia, rate 105, low voltage. CT abdomen pelvis showed moderate-sized layering right pleural effusion and increased circumferential pericardial effusion measuring  up to 9 mm.  Underwent thoracentesis with 600 cc of fluid removed.  On exam, patient is alert and oriented, regular rate and rhythm, no murmurs, lungs CTAB, no LE edema or JVD.  For her pericardial effusion, will check echo to monitor.  No clinical evidence of tamponade.  Donato Heinz, MD

## 2022-09-10 NOTE — Progress Notes (Signed)
  Transition of Care [facility]) Screening Note   Patient Details  Name: Karen [name] Date of Birth: 06-25-1965   Transition of Care [facility]) CM/SW Contact:    Vassie Moselle, LCSW Phone Number: 09/10/2022, 1:48 PM    Transition of Care Department [facility]) has reviewed patient and no TOC needs have been identified at this time. We will continue to monitor patient advancement through interdisciplinary progression rounds. If new patient transition needs arise, please place a TOC consult.

## 2022-09-10 NOTE — Progress Notes (Signed)
  Echocardiogram 2D Echocardiogram has been performed.  Karen [name] 09/10/2022, 9:48 AM

## 2022-09-10 NOTE — Telephone Encounter (Signed)
Per 3/22 IB reached out to patient to schedule; left voicemail.
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
## 2022-09-11 DIAGNOSIS — I3131 Malignant pericardial effusion in diseases classified elsewhere: Secondary | ICD-10-CM | POA: Diagnosis not present

## 2022-09-11 DIAGNOSIS — R112 Nausea with vomiting, unspecified: Secondary | ICD-10-CM | POA: Diagnosis not present

## 2022-09-11 LAB — BASIC METABOLIC PANEL
Anion gap: 9 (ref 5–15)
BUN: <5 mg/dL — ABNORMAL LOW (ref 6–20)
CO2: 19 mmol/L — ABNORMAL LOW (ref 22–32)
Calcium: 6.6 mg/dL — ABNORMAL LOW (ref 8.9–10.3)
Chloride: 105 mmol/L (ref 98–111)
Creatinine, Ser: 0.48 mg/dL (ref 0.44–1.00)
GFR, Estimated: >60 mL/min (ref >60)
Glucose, Bld: 105 mg/dL — ABNORMAL HIGH (ref 70–99)
Potassium: 3.3 mmol/L — ABNORMAL LOW (ref 3.5–5.1)
Sodium: 133 mmol/L — ABNORMAL LOW (ref 135–145)

## 2022-09-11 LAB — CBC
HCT: 29.8 % — ABNORMAL LOW (ref 36.0–46.0)
Hemoglobin: 10 g/dL — ABNORMAL LOW (ref 12.0–15.0)
MCH: 31.3 pg (ref 26.0–34.0)
MCHC: 33.6 g/dL (ref 30.0–36.0)
MCV: 93.1 fL (ref 80.0–100.0)
Platelets: 410 10*3/uL — ABNORMAL HIGH (ref 150–400)
RBC: 3.2 MIL/uL — ABNORMAL LOW (ref 3.87–5.11)
RDW: 15.1 % (ref 11.5–15.5)
WBC: 4.7 10*3/uL (ref 4.0–10.5)
nRBC: 0 % (ref 0.0–0.2)

## 2022-09-11 LAB — PHOSPHORUS: Phosphorus: 1.8 mg/dL — ABNORMAL LOW (ref 2.5–4.6)

## 2022-09-11 LAB — MAGNESIUM: Magnesium: 2.1 mg/dL (ref 1.7–2.4)

## 2022-09-11 MED ORDER — SODIUM CHLORIDE 0.9% FLUSH: 10.0000 mL | INTRAVENOUS | Status: DC | PRN

## 2022-09-11 MED ORDER — CALCIUM CARBONATE 1250 (500 CA) MG PO TABS
1.0000 | ORAL_TABLET | Freq: Two times a day (BID) | ORAL | Status: DC
  Administered 2022-09-11 – 2022-09-13 (×4): 1250 mg via ORAL
  Filled 2022-09-11 (×5): qty 1

## 2022-09-11 MED ORDER — POTASSIUM PHOSPHATES 15 MMOLE/5ML IV SOLN
30.0000 mmol | Freq: Once | INTRAVENOUS | Status: AC
  Administered 2022-09-11: 30 mmol via INTRAVENOUS
  Filled 2022-09-11: qty 10

## 2022-09-11 MED ORDER — ENSURE ENLIVE PO LIQD
237.0000 mL | Freq: Three times a day (TID) | ORAL | Status: DC
  Administered 2022-09-13 (×2): 237 mL via ORAL

## 2022-09-11 MED ORDER — CHLORHEXIDINE GLUCONATE CLOTH 2 % EX PADS
6.0000 | MEDICATED_PAD | Freq: Every day | CUTANEOUS | Status: DC
  Administered 2022-09-11 – 2022-09-13 (×3): 6 via TOPICAL

## 2022-09-11 MED ORDER — OXYMETAZOLINE HCL 0.05 % NA SOLN
3.0000 | NASAL | Status: DC | PRN
  Filled 2022-09-11: qty 15

## 2022-09-11 MED ORDER — LIP MEDEX EX OINT: 1.0000 | TOPICAL_OINTMENT | CUTANEOUS | Status: DC | PRN

## 2022-09-11 MED ORDER — SALINE SPRAY 0.65 % NA SOLN
1.0000 | NASAL | Status: DC | PRN
  Filled 2022-09-11: qty 44

## 2022-09-11 MED ORDER — POTASSIUM CHLORIDE 10 MEQ/100ML IV SOLN
10.0000 meq | INTRAVENOUS | Status: AC
  Administered 2022-09-11 (×3): 10 meq via INTRAVENOUS
  Filled 2022-09-11 (×2): qty 100
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
## 2022-09-11 NOTE — Progress Notes (Signed)
Progress Note  Patient Name: Karen [name] Date of Encounter: 09/11/2022  Primary Cardiologist: New to [facility]  Interval Summary   Chart reviewed.  No significant events overnight.  She does not report any chest pain or shortness of breath at rest.  Spanish interpreter was utilized throughout our encounter.  Vital Signs    Vitals:   09/10/22 1517 09/10/22 1600 09/10/22 1906 09/11/22 0416  BP: 136/84  135/84 124/81  Pulse: 79  95 93  Resp: 20 15 16 16   Temp: 97.9 F (36.6 C)  98.4 F (36.9 C) 98.1 F (36.7 C)  TempSrc: Axillary  Oral Oral  SpO2: 97%  100% 98%  Weight:      Height:        Intake/Output Summary (Last 24 hours) at 09/11/2022 0815 Last data filed at 09/11/2022 0547 Gross per 24 hour  Intake 3618.12 ml  Output --  Net 3618.12 ml   Filed Weights   09/09/22 0932  Weight: 44.8 kg    Physical Exam   GEN: No acute distress.   Neck: No JVD. Cardiac: RRR, no gallop or rub.  Respiratory: Nonlabored.  Decreased breath sounds at the bases. GI: Soft, nontender, bowel sounds present. MS: No edema.  ECG/Telemetry    An ECG dated 09/09/2022 was personally reviewed today and demonstrated:  Sinus tachycardia.  Current telemetry shows sinus rhythm.  Labs    Chemistry Recent Labs  Lab 09/08/22 1113 09/09/22 0523 09/10/22 0609 09/11/22 0549  NA 137 134* 131* 133*  K 3.5 3.5 3.4* 3.3*  CL 108 110 105 105  CO2 21* 15* 19* 19*  GLUCOSE 88 109* 120* 105*  BUN 7 6 <5* <5*  CREATININE 0.58 0.50 0.48 0.48  CALCIUM 8.0* 7.9* 6.9* 6.6*  PROT 6.0* 6.1* 5.9*  --   ALBUMIN 3.4* 3.2* 3.4*  --   AST 36 46* 35  --   ALT 41 52* 45*  --   ALKPHOS 492* 465* 424*  --   BILITOT 0.2* 0.6 0.4  --   GFRNONAA >60 >60 >60 >60  ANIONGAP 8 9 7 9     Hematology Recent Labs  Lab 09/09/22 0523 09/10/22 0609 09/11/22 0549  WBC 4.3 4.0 4.7  RBC 3.75* 3.33* 3.20*  HGB 11.7* 10.6* 10.0*  HCT 34.2* 31.2* 29.8*  MCV 91.2 93.7 93.1  MCH 31.2 31.8  31.3  MCHC 34.2 34.0 33.6  RDW 15.2 15.1 15.1  PLT 532* 489* 410*   Cardiac Enzymes Recent Labs  Lab 08/21/22 1930 08/21/22 2129  TROPONINIHS 3 3   Lipid Panel     Component Value Date/Time   CHOL 124 09/02/2022 0528   CHOL 260 (H) 08/25/2021 1151   TRIG 146 09/02/2022 0528   HDL 46 09/02/2022 0528   HDL 49 08/25/2021 1151   CHOLHDL 2.7 09/02/2022 0528   VLDL 29 09/02/2022 0528   LDLCALC 49 09/02/2022 0528   LDLCALC 160 (H) 08/25/2021 1151   LABVLDL 51 (H) 08/25/2021 1151    Cardiac Studies   Echocardiogram 09/10/2022:  1. Limited echo for pericardial effusion   2. Left ventricular ejection fraction, by estimation, is >75%. Left  ventricular ejection fraction by PLAX is 79 %. The left ventricle has  hyperdynamic function. The left ventricle has no regional wall motion  abnormalities.   3. A small pericardial effusion is present. The pericardial effusion is  circumferential. There is no evidence of cardiac tamponade.   4. The aortic valve is tricuspid. Aortic valve  regurgitation is not  visualized. No aortic stenosis is present.   5. There is normal pulmonary artery systolic pressure.   6. The inferior vena cava is normal in size with greater than 50%  respiratory variability, suggesting right atrial pressure of 3 mmHg.   Assessment & Plan   Pericardial effusion, suspected to be malignant and in association with adenocarcinoma of the lung.  I reviewed the echocardiogram images, pericardial effusion is small to moderate and circumferential without clear evidence of tamponade.  There is mild compression of the RV in early diastole, but no significant respiratory change in mitral inflow and she has otherwise been hemodynamically stable.  Would continue observation for now.  Signed, Rozann Lesches, MD  09/11/2022, 8:15 AM

## 2022-09-12 DIAGNOSIS — I3131 Malignant pericardial effusion in diseases classified elsewhere: Secondary | ICD-10-CM | POA: Diagnosis not present

## 2022-09-12 LAB — CBC
HCT: 29.9 % — ABNORMAL LOW (ref 36.0–46.0)
Hemoglobin: 10.2 g/dL — ABNORMAL LOW (ref 12.0–15.0)
MCH: 31.5 pg (ref 26.0–34.0)
MCHC: 34.1 g/dL (ref 30.0–36.0)
MCV: 92.3 fL (ref 80.0–100.0)
Platelets: 425 10*3/uL — ABNORMAL HIGH (ref 150–400)
RBC: 3.24 MIL/uL — ABNORMAL LOW (ref 3.87–5.11)
RDW: 14.7 % (ref 11.5–15.5)
WBC: 4.9 10*3/uL (ref 4.0–10.5)
nRBC: 0 % (ref 0.0–0.2)

## 2022-09-12 LAB — BASIC METABOLIC PANEL
Anion gap: 6 (ref 5–15)
BUN: <5 mg/dL — ABNORMAL LOW (ref 6–20)
CO2: 22 mmol/L (ref 22–32)
Calcium: 6.9 mg/dL — ABNORMAL LOW (ref 8.9–10.3)
Chloride: 104 mmol/L (ref 98–111)
Creatinine, Ser: 0.53 mg/dL (ref 0.44–1.00)
GFR, Estimated: >60 mL/min (ref >60)
Glucose, Bld: 101 mg/dL — ABNORMAL HIGH (ref 70–99)
Potassium: 3.3 mmol/L — ABNORMAL LOW (ref 3.5–5.1)
Sodium: 132 mmol/L — ABNORMAL LOW (ref 135–145)

## 2022-09-12 LAB — MAGNESIUM: Magnesium: 2 mg/dL (ref 1.7–2.4)

## 2022-09-12 LAB — PHOSPHORUS: Phosphorus: 2 mg/dL — ABNORMAL LOW (ref 2.5–4.6)

## 2022-09-12 MED ORDER — CLONAZEPAM 1 MG PO TABS
1.0000 mg | ORAL_TABLET | Freq: Two times a day (BID) | ORAL | Status: DC
  Administered 2022-09-12 – 2022-09-13 (×3): 1 mg via ORAL
  Filled 2022-09-12 (×3): qty 1

## 2022-09-12 MED ORDER — POTASSIUM CHLORIDE 10 MEQ/100ML IV SOLN
10.0000 meq | INTRAVENOUS | Status: AC
  Administered 2022-09-12 (×3): 10 meq via INTRAVENOUS
  Filled 2022-09-12 (×3): qty 100

## 2022-09-12 MED ORDER — POTASSIUM PHOSPHATES 15 MMOLE/5ML IV SOLN
30.0000 mmol | Freq: Once | INTRAVENOUS | Status: AC
  Administered 2022-09-12: 30 mmol via INTRAVENOUS
  Filled 2022-09-12: qty 10

## 2022-09-12 MED ORDER — ASPIRIN 81 MG PO TBEC
81.0000 mg | DELAYED_RELEASE_TABLET | Freq: Every day | ORAL | Status: DC
  Administered 2022-09-12 – 2022-09-13 (×2): 81 mg via ORAL
  Filled 2022-09-12 (×2): qty 1

## 2022-09-12 MED ORDER — LORAZEPAM 1 MG PO TABS: 1.0000 mg | ORAL_TABLET | Freq: Four times a day (QID) | ORAL | Status: DC | PRN

## 2022-09-12 MED ORDER — PANTOPRAZOLE SODIUM 40 MG PO TBEC
40.0000 mg | DELAYED_RELEASE_TABLET | Freq: Two times a day (BID) | ORAL | Status: DC
  Administered 2022-09-12 – 2022-09-13 (×2): 40 mg via ORAL
  Filled 2022-09-12 (×2): qty 1

## 2022-09-12 MED ORDER — METOCLOPRAMIDE HCL 5 MG PO TABS
5.0000 mg | ORAL_TABLET | Freq: Three times a day (TID) | ORAL | Status: DC
  Administered 2022-09-12 – 2022-09-13 (×5): 5 mg via ORAL
  Filled 2022-09-12 (×5): qty 1

## 2022-09-12 MED ORDER — K PHOS MONO-SOD PHOS DI & MONO 155-852-130 MG PO TABS
500.0000 mg | ORAL_TABLET | Freq: Two times a day (BID) | ORAL | Status: DC
  Administered 2022-09-12 – 2022-09-13 (×2): 500 mg via ORAL
  Filled 2022-09-12 (×2): qty 2

## 2022-09-12 NOTE — Progress Notes (Signed)
Mobility Specialist - Progress Note   09/12/22 1350  Mobility  Activity Ambulated with assistance in hallway  Level of Assistance Modified independent, requires aide device or extra time  Assistive Device Front wheel walker  Distance Ambulated (ft) 250 ft  Activity Response Tolerated well  Mobility Referral Yes  $Mobility charge 1 Mobility   Pt received in bed and agreeable to mobility. Eager to ambulate despite feeling nauseous. No other complaints during session. Pt to bed after session w/ all needs met.   [facility]
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
## 2022-09-13 DIAGNOSIS — I3131 Malignant pericardial effusion in diseases classified elsewhere: Secondary | ICD-10-CM | POA: Diagnosis not present

## 2022-09-13 DIAGNOSIS — R112 Nausea with vomiting, unspecified: Secondary | ICD-10-CM | POA: Diagnosis not present

## 2022-09-13 LAB — BASIC METABOLIC PANEL
Anion gap: 8 (ref 5–15)
BUN: <5 mg/dL — ABNORMAL LOW (ref 6–20)
CO2: 23 mmol/L (ref 22–32)
Calcium: 6.8 mg/dL — ABNORMAL LOW (ref 8.9–10.3)
Chloride: 103 mmol/L (ref 98–111)
Creatinine, Ser: 0.51 mg/dL (ref 0.44–1.00)
GFR, Estimated: >60 mL/min (ref >60)
Glucose, Bld: 107 mg/dL — ABNORMAL HIGH (ref 70–99)
Potassium: 3.5 mmol/L (ref 3.5–5.1)
Sodium: 134 mmol/L — ABNORMAL LOW (ref 135–145)

## 2022-09-13 LAB — CBC
HCT: 30.9 % — ABNORMAL LOW (ref 36.0–46.0)
Hemoglobin: 10.3 g/dL — ABNORMAL LOW (ref 12.0–15.0)
MCH: 31.2 pg (ref 26.0–34.0)
MCHC: 33.3 g/dL (ref 30.0–36.0)
MCV: 93.6 fL (ref 80.0–100.0)
Platelets: 390 10*3/uL (ref 150–400)
RBC: 3.3 MIL/uL — ABNORMAL LOW (ref 3.87–5.11)
RDW: 14.9 % (ref 11.5–15.5)
WBC: 5.5 10*3/uL (ref 4.0–10.5)
nRBC: 0 % (ref 0.0–0.2)

## 2022-09-13 LAB — PHOSPHORUS: Phosphorus: 3.8 mg/dL (ref 2.5–4.6)

## 2022-09-13 LAB — MAGNESIUM: Magnesium: 2 mg/dL (ref 1.7–2.4)

## 2022-09-13 MED ORDER — K PHOS MONO-SOD PHOS DI & MONO 155-852-130 MG PO TABS: 500.0000 mg | ORAL_TABLET | Freq: Two times a day (BID) | ORAL | 0 refills | Status: AC

## 2022-09-13 MED ORDER — FLUCONAZOLE 200 MG PO TABS
200.0000 mg | ORAL_TABLET | Freq: Every day | ORAL | Status: DC
  Administered 2022-09-13: 200 mg via ORAL
  Filled 2022-09-13: qty 1

## 2022-09-13 MED ORDER — METOCLOPRAMIDE HCL 5 MG PO TABS: 5.0000 mg | ORAL_TABLET | Freq: Three times a day (TID) | ORAL | 0 refills | Status: DC

## 2022-09-13 MED ORDER — AMOXICILLIN-POT CLAVULANATE 875-125 MG PO TABS: 1.0000 | ORAL_TABLET | Freq: Two times a day (BID) | ORAL | Status: DC

## 2022-09-13 MED ORDER — AMOXICILLIN-POT CLAVULANATE 875-125 MG PO TABS: 1.0000 | ORAL_TABLET | Freq: Two times a day (BID) | ORAL | 0 refills | Status: AC

## 2022-09-13 MED ORDER — FENTANYL 25 MCG/HR TD PT72: 1.0000 | MEDICATED_PATCH | TRANSDERMAL | 0 refills | Status: DC

## 2022-09-13 MED ORDER — ONDANSETRON 8 MG PO TBDP: 8.0000 mg | ORAL_TABLET | Freq: Three times a day (TID) | ORAL | 0 refills | Status: DC | PRN

## 2022-09-13 MED ORDER — PANTOPRAZOLE SODIUM 40 MG PO TBEC: 40.0000 mg | DELAYED_RELEASE_TABLET | Freq: Two times a day (BID) | ORAL | 0 refills | Status: DC

## 2022-09-13 MED ORDER — SERTRALINE HCL 100 MG PO TABS: 100.0000 mg | ORAL_TABLET | Freq: Two times a day (BID) | ORAL | 0 refills | Status: AC

## 2022-09-13 MED ORDER — NYSTATIN 100000 UNIT/ML MT SUSP: 5.0000 mL | Freq: Four times a day (QID) | OROMUCOSAL | 0 refills | Status: DC

## 2022-09-13 MED ORDER — FLUCONAZOLE 200 MG PO TABS: 200.0000 mg | ORAL_TABLET | Freq: Every day | ORAL | 0 refills | Status: AC

## 2022-09-13 MED ORDER — PROCHLORPERAZINE MALEATE 10 MG PO TABS: 10.0000 mg | ORAL_TABLET | Freq: Four times a day (QID) | ORAL | 0 refills | Status: DC | PRN

## 2022-09-13 MED ORDER — HEPARIN SOD (PORK) LOCK FLUSH 100 UNIT/ML IV SOLN
500.0000 [IU] | INTRAVENOUS | Status: AC | PRN
  Administered 2022-09-13: 500 [IU]
  Filled 2022-09-13: qty 5

## 2022-09-13 NOTE — Progress Notes (Signed)
Mobility Specialist - Progress Note   09/13/22 1046  Mobility  Activity Ambulated with assistance in hallway  Level of Assistance Contact guard assist, steadying assist  Assistive Device None  Distance Ambulated (ft) 500 ft  Activity Response Tolerated well  Mobility Referral Yes  $Mobility charge 1 Mobility   Pt received in bed and agreed to mobility, with no issues throughout session pt returned to bed with all needs met.   Roderick Pee Mobility Specialist
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
## 2022-09-13 NOTE — Discharge Summary (Signed)
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 effusion. No acute osseous findings. There is a small amount of soft tissue emphysema within the right lateral chest wall. IMPRESSION: 1. Stable small right apical pneumothorax following chest tube clamping. No significant change in bilateral interstitial and airspace opacities. 2. Stable mediastinal and hilar adenopathy. Electronically Signed   By: Richardean Sale M.D.   On: 08/27/2022 11:09   DG CHEST PORT 1 VIEW  Result Date: 08/27/2022 CLINICAL DATA:  Pneumothorax on right EXAM: PORTABLE CHEST 1 VIEW COMPARISON:  Radiograph 08/26/2022 FINDINGS: Chest port catheter tip overlies the distal SVC/superior cavoatrial junction. Unchanged cardiomediastinal silhouette. There are diffuse interstitial opacities with right greater than left airspace disease. There is a small right apical pneumothorax which is decreased from prior exam, measuring up to 1.6 cm in the apex, previously 2.5 cm. Stable right peripheral pigtail chest tube. Bones are unchanged.  IMPRESSION: Decreased, small right apical pneumothorax with chest tube in place. Unchanged bilateral interstitial and airspace disease, right greater than left.  Electronically Signed   By: Maurine Simmering M.D.   On: 08/27/2022 08:17   DG CHEST PORT 1 VIEW  Result Date: 08/26/2022 CLINICAL DATA:  Right pleural effusion. EXAM: PORTABLE CHEST 1 VIEW COMPARISON:  Chest radiographs 08/26/2022 at 4:54 a.m., 08/25/2022, 08/24/2022; CT chest 08/21/2022 FINDINGS: Right chest wall porta catheter tip again overlies the central superior vena cava. A right pigtail chest tube tip is again coronal overlying the lateral mid height of the right hemithorax, not simply changed. No significant change in small right apical pneumothorax measuring approximately 1.5 cm in craniocaudal height at the right lung apex. No significant change in right perihilar MR mild left midlung heterogeneous airspace opacities. Right superior perihilar fullness is again seen consistent with the lymphadenopathy seen on prior CT. No pleural effusion is seen. No acute skeletal abnormality. IMPRESSION: 1. No significant change in small right apical pneumothorax. Right pigtail chest tube tip overlies the lateral mid height of the right hemithorax, unchanged on frontal view. 2. No significant change in right perihilar and left midlung heterogeneous airspace opacities. Electronically Signed   By: Yvonne Kendall M.D.   On: 08/26/2022 13:07   DG Chest Port 1 View  Result Date: 08/26/2022 CLINICAL DATA:  Follow up pneumothorax EXAM: PORTABLE CHEST 1 VIEW COMPARISON:  08/25/2022 FINDINGS: Right apical pneumothorax status diminished slightly compared to the prior study with about 1.5 cm pleural separation at the apex. Right mid thorax chest tube in place. Diffuse alveolar and interstitial opacities consistent with pulmonary edema or bilateral pneumonia. No pleural effusions identified. Right-sided Port-A-Cath tip distal SVC. IMPRESSION: Persistent, slightly decreased  right-sided pneumothorax with a right chest tube in place. Diffuse interstitial and alveolar opacities. Electronically Signed   By: Sammie Bench M.D.   On: 08/26/2022 07:50   CT [facility] PLEURAL DRAIN W/INDWELL CATH W/IMG GUIDE  Result Date: 08/25/2022 INDICATION: History of right-sided lung cancer, now with enlarging, symptomatic right-sided hydropneumothorax following recent thoracentesis. Please perform image guided placement of right-sided chest tube. EXAM: CT PERC PLEURAL DRAIN W/INDWELL CATH W/IMG GUIDE COMPARISON:  Chest radiograph-earlier same day; 08/23/2022; 08/22/2022; 08/21/2022 Ultrasound-guided right-sided thoracentesis-08/22/2022 Chest CT-08/21/2022 PET-CT-08/18/2022 MEDICATIONS: The patient is currently admitted to the hospital and receiving intravenous antibiotics. The antibiotics were administered within an appropriate time frame prior to the initiation of the procedure. ANESTHESIA/SEDATION: Moderate (conscious) sedation was employed during this procedure. A total of Versed 2 mg and Fentanyl 100 mcg was administered intravenously. Moderate Sedation Time: 21 minutes. The patient's level of consciousness and vital signs were monitored continuously by radiology nursing throughout the procedure under my direct supervision. CONTRAST:  None COMPLICATIONS: None immediate. PROCEDURE: RADIATION DOSE REDUCTION: This exam was performed according to the departmental dose-optimization program which includes automated exposure control, adjustment of the mA and/or kV according to patient size and/or use of iterative reconstruction technique. Informed written consent was obtained from the patient after a discussion of the risks, benefits and alternatives to treatment. The patient was placed supine on the CT gantry and a pre procedural CT was performed re-demonstrating the known moderate-sized right-sided hydropneumothorax. The procedure was planned utilizing an anterolateral approach with attention made to  avoid the port a catheter reservoir site. A timeout was performed prior to the initiation of the procedure. The skin overlying the right upper chest was prepped and draped in the usual sterile fashion. The overlying soft tissues were anesthetized with 1% lidocaine with epinephrine. Next, an 3 gauge trocar needle was utilized to access the superolateral aspect of the right pleural space.  Appropriate position was confirmed with the aspiration of air. Next, a short Amplatz wire was coiled within the apical aspect of the right pleural space. Appropriate position was confirmed with CT imaging (series 6). Next, the tract was serially dilated allowing placement of a 10 [place] all-purpose drainage catheter. Appropriate positioning was confirmed with a limited postprocedural CT scan. Next, air and approximately 250 cc of serous fluid was aspirated. The tube was connected to a pleura vac device and sutured in place. A dressing was applied. The patient tolerated the procedure well without immediate post procedural complication. IMPRESSION: Successful CT guided placement of a 10 [place] all purpose drain catheter into the apical aspects of right-sided hydropneumothorax with aspiration of air and approximately 250 cc of serous fluid. Electronically Signed   By: Sandi Mariscal M.D.   On: 08/25/2022 09:45   DG CHEST PORT 1 VIEW  Result Date: 08/25/2022 CLINICAL DATA:  Right pneumothorax. Mediastinal masses, no malignancy. EXAM: PORTABLE CHEST 1 VIEW COMPARISON:  Chest radiographs 08/24/2022 at 12:25 p.m., 08/23/2022, 08/22/2022; CT chest 08/21/2022; CT-guided percutaneous chest tube placement 08/24/2022 at 3 p.m. FINDINGS: The patient underwent CT-guided percutaneous chest tube placement on the right 08/24/2022 at 3 p.m. Compared to 08/24/2022 at 12:25 p.m. chest radiograph, there is a new right-sided chest tube with pigtail overlying the lateral right hemithorax at mid height. Interval decrease in right pneumothorax, with the  extrapleural air space measuring approximately 2.3 cm in craniocaudal dimension at the superior right hemithorax compared to 5.3 cm on 08/24/2022 prior to the chest tube placement. The prior layering right basilar air-fluid level is no longer visualized. No left pneumothorax. There are patchy heterogeneous airspace opacities throughout the right mid to lower lung and left perihilar region, similar to prior. Cardiac silhouette is normal in size. Right greater than left hilar fullness consistent with the mediastinal masses seen on prior CT. Right chest wall porta catheter tip overlies the central superior vena cava/right atrial junction. No acute skeletal abnormality. IMPRESSION: Compared to 08/24/2022 at 12:25 p.m.: 1. New right-sided chest tube with pigtail overlying the lateral right hemithorax at mid height. 2. Interval decrease in right pneumothorax, with the extrapleural air space measuring approximately 2.3 cm in craniocaudal dimension at the superior right hemithorax compared to 5.3 cm on 08/24/2022 prior to the chest tube placement. Electronically Signed   By: Yvonne Kendall M.D.   On: 08/25/2022 08:35   DG Chest 2 View  Addendum Date: 08/24/2022   ADDENDUM REPORT: 08/24/2022 12:53 ADDENDUM: Findings were discussed with and acknowledged by Dr. Barb Merino. Electronically Signed   By: Sammie Bench M.D.   On: 08/24/2022 12:53   Result Date: 08/24/2022 CLINICAL DATA:  Follow up pneumothorax EXAM: CHEST - 2 VIEW COMPARISON:  08/23/2022 FINDINGS: Right-sided hydropneumothorax has increased in size and there is no evidence of shift of mediastinal structures to the left. There is alveolar consolidation both lungs consistent with edema. Cardiac silhouette is unremarkable. Right-sided Port-A-Cath tip superimposed with distal SVC. IMPRESSION: Right hydropneumothorax with an interval increase in size and new mediastinal shift. Diffuse alveolar consolidation lungs consistent with edema. Electronically Signed:  By: Sammie Bench M.D. On: 08/24/2022 12:42   DG Chest 1 View  Result Date: 08/23/2022 CLINICAL DATA:  Right apical pneumothorax EXAM: CHEST  1 VIEW COMPARISON:  Chest radiograph dated 08/22/2022 FINDINGS: Lines/tubes: Right chest wall port tip projects over the superior cavoatrial junction. Chest: Increased right lung opacification. Minimal patchy and interstitial left mid lung opacities. Pleura: Slightly increased small right apical  pneumothorax. Slightly increased trace right pleural effusion. Heart/mediastinum: Similar  cardiomediastinal silhouette. Bones: No acute osseous abnormality. IMPRESSION: 1. Slightly increased small right apical pneumothorax and trace right pleural effusion. 2. Increased right lung opacification, which may represent atelectasis, pneumonia, or aspiration. Electronically Signed   By: Darrin Nipper M.D.   On: 08/23/2022 15:00   US THORACENTESIS ASP PLEURAL SPACE W/IMG GUIDE  Result Date: 08/22/2022 INDICATION: Lung cancer with right pleural effusion. Request for diagnostic and therapeutic thoracentesis. EXAM: ULTRASOUND GUIDED RIGHT THORACENTESIS MEDICATIONS: 1% lidocaine 4 mL COMPLICATIONS: SIR Level A - No therapy, no consequence. PROCEDURE: An ultrasound guided thoracentesis was thoroughly discussed with the patient and questions answered. The benefits, risks, alternatives and complications were also discussed. The patient understands and wishes to proceed with the procedure. Written consent was obtained. Ultrasound was performed to localize and mark an adequate pocket of fluid in the right chest. The area was then prepped and draped in the normal sterile fashion. 1% Lidocaine was used for local anesthesia. Under ultrasound guidance a 6 Fr Safe-T-Centesis catheter was introduced. Thoracentesis was performed. The catheter was removed and a dressing applied. FINDINGS: A total of approximately 420 mL of clear amber fluid was removed. Samples were sent to the laboratory as requested by  the clinical team. IMPRESSION: Successful ultrasound guided right thoracentesis yielding 420 mL of pleural fluid. Post-procedure chest x-ray shows small apical pneumothorax most likely consistent with incomplete re-expansion due to known lung carcinoma. We will repeat chest x-ray tomorrow morning to ensure stability. Procedure performed by: Gareth Eagle, PA-C Electronically Signed   By: Aletta Edouard M.D.   On: 08/22/2022 11:06   DG Chest 1 View  Result Date: 08/22/2022 CLINICAL DATA:  Status post right thoracentesis. EXAM: CHEST  1 VIEW COMPARISON:  08/21/2022 FINDINGS: Interval decreased right pleural effusion. Small right apical pneumothorax evident. Right suprahilar and parahilar airspace disease again noted with patchy subtle parahilar opacities in the left lung. The visualized bony structures of the thorax are unremarkable. Telemetry leads overlie the chest. IMPRESSION: 1. Interval decreased right pleural effusion status post thoracentesis. Small right apical pneumothorax. 2. Right suprahilar and parahilar airspace disease with patchy subtle parahilar opacities in the left lung. These results will be called to the ordering clinician or representative by the Radiologist Assistant, and communication documented in the PACS or [employer]. Electronically Signed   By: Misty Stanley M.D.   On: 08/22/2022 10:28   CT Angio Chest PE W and/or Wo Contrast  Result Date: 08/21/2022 CLINICAL DATA:  Concern for pulmonary embolism. History hands cold post noted it cold EXAM: CT ANGIOGRAPHY CHEST WITH CONTRAST TECHNIQUE: Multidetector CT imaging of the chest was performed using the standard protocol during bolus administration of intravenous contrast. Multiplanar CT image reconstructions and MIPs were obtained to evaluate the vascular anatomy. RADIATION DOSE REDUCTION: This exam was performed according to the departmental dose-optimization program which includes automated exposure control, adjustment of the mA  and/or kV according to patient size and/or use of iterative reconstruction technique. CONTRAST:  69mL OMNIPAQUE IOHEXOL 350 MG/ML SOLN COMPARISON:  Chest CT dated 06/25/2022 and radiograph dated 08/21/2022. FINDINGS: Evaluation of this exam is limited due to respiratory motion artifact. Cardiovascular: There is no cardiomegaly small pericardial effusion, new since the prior CT and measuring 1 cm in thickness. The thoracic aorta is unremarkable. The origins of the great vessels of the aortic arch appear patent. Evaluation of the pulmonary arteries is limited due to respiratory motion. No central pulmonary artery embolus identified. Mediastinum/Nodes:  There is infiltrative soft tissue the mediastinum and bilateral hila in keeping with known malignancy. No mediastinal fluid collection. Right-sided Port-A-Cath with tip at the cavoatrial junction. Lungs/Pleura: There is a moderate size right and trace left pleural effusions, new since the prior CT and suspicious for malignant effusion. There is partial compressive atelectasis of the right lower lobe. Patchy area of consolidation in the right perihilar region extending into the right upper lobe and right middle lobes may represent combination of tumor infiltration, atelectasis, or pneumonia. There is however diffuse interstitial thickening and nodularity primarily involving the upper lobes bilaterally as well as in the lower lobes suspicious for lymphangitic carcinomatosis. There is no pneumothorax. There is narrowing of the right upper lobe bronchus as well as narrowing of the right middle and right lower lobe bronchi. The central airways remain patent. Upper Abdomen: Small calcified splenic granuloma. Musculoskeletal: Sclerotic lesions involving T10, T12, and L3 consistent with metastatic disease. No acute osseous pathology. Review of the MIP images confirms the above findings. IMPRESSION: 1. No CT evidence of central pulmonary artery embolus. 2. Moderate size right and  trace left pleural effusions, new since the prior CT and suspicious for malignant effusion. 3. Infiltrative mediastinal and hilar mass in keeping with known malignancy. 4. Significant progression of disease with bilateral perihilar consolidation and findings 5. suspicious for lymphangitic carcinomatosis. 6. Sclerotic lesions involving T10, T12, and L3 consistent with metastatic disease. Electronically Signed   By: Anner Crete M.D.   On: 08/21/2022 20:53   DG Chest Port 1 View  Result Date: 08/21/2022 CLINICAL DATA:  Shortness of breath EXAM: PORTABLE CHEST 1 VIEW COMPARISON:  PET-CT dated July 29, 2022 and CT examination dated August 13, 2022 FINDINGS: The heart is normal in size. Pulmonary vascular congestion with bilateral perihilar opacities consistent with patient's known hilar lymphadenopathy. Small bilateral pleural effusions. Right IJ access MediPort with distal tip in the SVC no acute osseous abnormality IMPRESSION: 1. Pulmonary vascular congestion with bilateral perihilar opacities consistent with patient's known hilar lymphadenopathy. 2. Small bilateral pleural effusions. Electronically Signed   By: Keane Police D.O.   On: 08/21/2022 19:59   VAS [place] LOWER EXTREMITY VENOUS (DVT)  Result Date: 08/17/2022  Lower Venous DVT Study Patient Name:  MUSKAN DOWN  Date of Exam:   08/17/2022 Medical Rec #: [id number]               Accession #:    [id number] Date of Birth: June 17, 1966                Patient Gender: F Patient Age:   50 years Exam Location:  [facility] Procedure:      VAS [place] LOWER EXTREMITY VENOUS (DVT) Referring Phys: Lottie Mussel --------------------------------------------------------------------------------  Other Indications: Stroke, primary lung adenocarcinoma. Comparison Study: No prior study. Performing Technologist: McKayla Maag RVT, [place]  Examination Guidelines: A complete evaluation includes B-mode imaging, spectral Doppler, color Doppler, and power Doppler as  needed of all accessible portions of each vessel. Bilateral testing is considered an integral part of a complete examination. Limited examinations for reoccurring indications may be performed as noted. The reflux portion of the exam is performed with the patient in reverse Trendelenburg.  +---------+---------------+---------+-----------+----------+--------------+ RIGHT    CompressibilityPhasicitySpontaneityPropertiesThrombus Aging +---------+---------------+---------+-----------+----------+--------------+ CFV      Full           Yes      Yes                                 +---------+---------------+---------+-----------+----------+--------------+  SFJ      Full                                                        +---------+---------------+---------+-----------+----------+--------------+ FV Prox  Full                                                        +---------+---------------+---------+-----------+----------+--------------+ FV Mid   Full                                                        +---------+---------------+---------+-----------+----------+--------------+ FV DistalFull                                                        +---------+---------------+---------+-----------+----------+--------------+ PFV      Full                                                        +---------+---------------+---------+-----------+----------+--------------+ POP      Full           Yes      Yes                                 +---------+---------------+---------+-----------+----------+--------------+ PTV      Full                                                        +---------+---------------+---------+-----------+----------+--------------+ PERO     Full                                                        +---------+---------------+---------+-----------+----------+--------------+    +---------+---------------+---------+-----------+----------+--------------+ LEFT     CompressibilityPhasicitySpontaneityPropertiesThrombus Aging +---------+---------------+---------+-----------+----------+--------------+ CFV      Full           Yes      Yes                                 +---------+---------------+---------+-----------+----------+--------------+ SFJ      Full                                                        +---------+---------------+---------+-----------+----------+--------------+  FV Prox  Full                                                        +---------+---------------+---------+-----------+----------+--------------+ FV Mid   Full                                                        +---------+---------------+---------+-----------+----------+--------------+ FV DistalFull                                                        +---------+---------------+---------+-----------+----------+--------------+ PFV      Full                                                        +---------+---------------+---------+-----------+----------+--------------+ POP      Full           Yes      Yes                                 +---------+---------------+---------+-----------+----------+--------------+ PTV      Full                                                        +---------+---------------+---------+-----------+----------+--------------+ PERO     Full                                                        +---------+---------------+---------+-----------+----------+--------------+     Summary: BILATERAL: - No evidence of deep vein thrombosis seen in the lower extremities, bilaterally. - No evidence of superficial venous thrombosis in the lower extremities, bilaterally. -No evidence of popliteal cyst, bilaterally.   *See table(s) above for measurements and observations. Electronically signed by Deitra Mayo MD on  08/17/2022 at 7:34:49 PM.    Final    IR IMAGING GUIDED PORT INSERTION  Result Date: 08/17/2022 INDICATION: Chemotherapy access EXAM: Chest port placement using ultrasound and fluoroscopic guidance MEDICATIONS: Documented in the EMR ANESTHESIA/SEDATION: Moderate (conscious) sedation was employed during this procedure. A total of Versed 2 mg and Fentanyl 100 mcg was administered intravenously. Moderate Sedation Time: 32 minutes. The patient's level of consciousness and vital signs were monitored continuously by radiology nursing throughout the procedure under my direct supervision. FLUOROSCOPY TIME:  Fluoroscopy Time: 0.1 minutes (1 mGy) COMPLICATIONS: None immediate. PROCEDURE: Informed written consent was obtained from the patient after a thorough discussion of the procedural risks, benefits and alternatives. All questions were addressed. Maximal  Sterile Barrier Technique was utilized including caps, mask, sterile gowns, sterile gloves, sterile drape, hand hygiene and skin antiseptic. A timeout was performed prior to the initiation of the procedure. The patient was placed supine on the exam table. The right neck and chest was prepped and draped in the standard sterile fashion. A preliminary ultrasound of the right neck was performed and demonstrates a patent right internal jugular vein. A permanent ultrasound image was stored in the electronic medical record. The overlying skin was anesthetized with 1% Lidocaine. Using ultrasound guidance, access was obtained into the right internal jugular vein using a 21 gauge micropuncture set. A wire was advanced into the SVC, a short incision was made at the puncture site, and serial dilatation performed. Next, in an ipsilateral infraclavicular location, an incision was made at the site of the subcutaneous reservoir. Blunt dissection was used to open a pocket to contain the reservoir. A subcutaneous tunnel was then created from the port site to the puncture site. A(n) 8 Fr  single lumen catheter was advanced through the tunnel. The catheter was attached to the port and this was placed in the subcutaneous pocket. Under fluoroscopic guidance, a peel away sheath was placed, and the catheter was trimmed to the appropriate length and was advanced into the central veins. The catheter length is 23 cm. The tip of the catheter lies near the superior cavoatrial junction. The port flushes and aspirates appropriately. The port was flushed and locked with heparinized saline. The port pocket was closed in 2 layers using 3-0 and 4-0 Vicryl/absorbable suture. Dermabond was also applied to both incisions. The patient tolerated the procedure well and was transferred to recovery in stable condition. IMPRESSION: Successful placement of a right-sided chest port via the right internal jugular vein. The port is ready for immediate use. Electronically Signed   By: Albin Felling M.D.   On: 08/17/2022 16:49   ECHO TEE  Result Date: 08/17/2022    TRANSESOPHOGEAL ECHO REPORT   Patient Name:   SRISHTI LABRAKE Date of Exam: 08/17/2022 Medical Rec #:  [id number]              Height:       62.0 in Accession #:    [id number]             Weight:       98.1 lb Date of Birth:  January 07, 1966               BSA:          1.412 m Patient Age:    28 years               BP:           119/82 mmHg Patient Gender: F                      HR:           88 bpm. Exam Location:  Inpatient Procedure: Transesophageal Echo, Color Doppler, Cardiac Doppler and Saline            Contrast Bubble Study Indications:     Stroke  History:         Patient has prior history of Echocardiogram examinations, most                  recent 08/14/2022. Risk Factors:Hypertension. Latent TB,                  metastatic lung  adenocarcinoma.  Sonographer:     Eartha Inch Referring Phys:  [id number] Margie [place] Diagnosing Phys: Oswaldo Milian MD PROCEDURE: After discussion of the risks and benefits of a TEE, an informed consent was obtained from the  patient. The transesophogeal probe was passed without difficulty through the esophogus of the patient. Imaged were obtained with the patient in a left lateral decubitus position. Sedation performed by different physician. The patient was monitored while under deep sedation. Image quality was good. The patient's vital signs; including heart rate, blood pressure, and oxygen saturation; remained stable throughout the procedure. The patient developed no complications during the procedure.  IMPRESSIONS  1. Left ventricular ejection fraction, by estimation, is 60 to 65%. The left ventricle has normal function.  2. Right ventricular systolic function is normal. The right ventricular size is normal.  3. No left atrial/left atrial appendage thrombus was detected.  4. Moderate pericardial effusion. Moderate pleural effusion.  5. The mitral valve is normal in structure. Trivial mitral valve regurgitation.  6. Tricuspid valve regurgitation is mild to moderate.  7. The aortic valve is tricuspid. Aortic valve regurgitation is not visualized. No aortic stenosis is present.  8. Evidence of atrial level shunting detected by color flow Doppler. Agitated saline contrast bubble study was positive with shunting observed after >6 cardiac cycles suggestive of intrapulmonary shunting. Conclusion(s)/Recommendation(s): No intracardiac source of embolism detected on this on this transesophageal echocardiogram. FINDINGS  Left Ventricle: Left ventricular ejection fraction, by estimation, is 60 to 65%. The left ventricle has normal function. The left ventricular internal cavity size was normal in size. Right Ventricle: The right ventricular size is normal. No increase in right ventricular wall thickness. Right ventricular systolic function is normal. Left Atrium: Left atrial size was normal in size. No left atrial/left atrial appendage thrombus was detected. Right Atrium: Right atrial size was normal in size. Pericardium: A moderately sized  pericardial effusion is present. Mitral Valve: The mitral valve is normal in structure. Trivial mitral valve regurgitation. Tricuspid Valve: The tricuspid valve is normal in structure. Tricuspid valve regurgitation is mild to moderate. Aortic Valve: The aortic valve is tricuspid. Aortic valve regurgitation is not visualized. No aortic stenosis is present. Pulmonic Valve: The pulmonic valve was not well visualized. Pulmonic valve regurgitation is trivial. Aorta: The aortic root and ascending aorta are structurally normal, with no evidence of dilitation. IAS/Shunts: Evidence of atrial level shunting detected by color flow Doppler. Agitated saline contrast was given intravenously to evaluate for intracardiac shunting. Agitated saline contrast bubble study was positive with shunting observed after >6 cardiac cycles suggestive of intrapulmonary shunting. Additional Comments: There is a moderate pleural effusion. Spectral Doppler performed. TRICUSPID VALVE TR Peak grad:   31.6 mmHg TR Mean grad:   21.0 mmHg TR Vmax:        281.00 cm/s TR Vmean:       218.0 cm/s Oswaldo Milian MD Electronically signed by Oswaldo Milian MD Signature Date/Time: 08/17/2022/1:59:41 PM    Final (Updated)     Microbiology: Results for orders placed or performed during the hospital encounter of 08/21/22  Culture, body fluid w Gram Stain-bottle     Status: None   Collection Time: 08/21/22  7:16 PM   Specimen: Fluid  Result Value Ref Range Status   Specimen Description FLUID  Final   Special Requests NONE  Final   Culture   Final    NO GROWTH 5 DAYS Performed at [facility], [address], [place], [place] [address]  Report Status 08/27/2022 FINAL  Final  Gram stain     Status: None   Collection Time: 08/21/22  7:16 PM   Specimen: Fluid  Result Value Ref Range Status   Specimen Description FLUID  Final   Special Requests NONE  Final   Gram Stain   Final    FEW WBC PRESENT, PREDOMINANTLY PMN NO ORGANISMS  SEEN Performed at [facility], [address], [place], [place] [address]    Report Status 08/22/2022 FINAL  Final  Resp panel by RT-PCR (RSV, Flu A&B, Covid) Anterior Nasal Swab     Status: None   Collection Time: 08/21/22  7:41 PM   Specimen: Anterior Nasal Swab  Result Value Ref Range Status   SARS Coronavirus 2 by RT PCR NEGATIVE NEGATIVE Final    Comment: (NOTE) SARS-CoV-2 target nucleic acids are NOT DETECTED.  The SARS-CoV-2 RNA is generally detectable in upper respiratory specimens during the acute phase of infection. The lowest concentration of SARS-CoV-2 viral copies this assay can detect is 138 copies/mL. A negative result does not preclude SARS-Cov-2 infection and should not be used as the sole basis for treatment or other patient management decisions. A negative result may occur with  improper specimen collection/handling, submission of specimen other than nasopharyngeal swab, presence of viral mutation(s) within the areas targeted by this assay, and inadequate number of viral copies(<138 copies/mL). A negative result must be combined with clinical observations, patient history, and epidemiological information. The expected result is Negative.  Fact Sheet for Patients:  EntrepreneurPulse.com.au  Fact Sheet for Healthcare Providers:  IncredibleEmployment.be  This test is no t yet approved or cleared by the [place] FDA and  has been authorized for detection and/or diagnosis of SARS-CoV-2 by FDA under an Emergency Use Authorization (EUA). This EUA will remain  in effect (meaning this test can be used) for the duration of the COVID-19 declaration under Section 564(b)(1) of the Act, 21 U.S.C.section 360bbb-3(b)(1), unless the authorization is terminated  or revoked sooner.       Influenza A by PCR NEGATIVE NEGATIVE Final   Influenza B by PCR NEGATIVE NEGATIVE Final    Comment: (NOTE) The Xpert Xpress  SARS-CoV-2/FLU/RSV plus assay is intended as an aid in the diagnosis of influenza from Nasopharyngeal swab specimens and should not be used as a sole basis for treatment. Nasal washings and aspirates are unacceptable for Xpert Xpress SARS-CoV-2/FLU/RSV testing.  Fact Sheet for Patients: EntrepreneurPulse.com.au  Fact Sheet for Healthcare Providers: IncredibleEmployment.be  This test is not yet approved or cleared by the [place] FDA and has been authorized for detection and/or diagnosis of SARS-CoV-2 by FDA under an Emergency Use Authorization (EUA). This EUA will remain in effect (meaning this test can be used) for the duration of the COVID-19 declaration under Section 564(b)(1) of the Act, 21 U.S.C. section 360bbb-3(b)(1), unless the authorization is terminated or revoked.     Resp Syncytial Virus by PCR NEGATIVE NEGATIVE Final    Comment: (NOTE) Fact Sheet for Patients: EntrepreneurPulse.com.au  Fact Sheet for Healthcare Providers: IncredibleEmployment.be  This test is not yet approved or cleared by the [place] FDA and has been authorized for detection and/or diagnosis of SARS-CoV-2 by FDA under an Emergency Use Authorization (EUA). This EUA will remain in effect (meaning this test can be used) for the duration of the COVID-19 declaration under Section 564(b)(1) of the Act, 21 U.S.C. section 360bbb-3(b)(1), unless the authorization is terminated or revoked.  Performed at [facility],  [place] [address], [place], [place] [address]   Culture, blood (routine x 2)     Status: None   Collection Time: 08/21/22  7:58 PM   Specimen: BLOOD  Result Value Ref Range Status   Specimen Description   Final    BLOOD PORTA CATH Performed at [facility] [address], [place], [place] [address]    Special Requests   Final    BOTTLES DRAWN AEROBIC AND ANAEROBIC Blood  Culture adequate volume Performed at [facility] [address] [place], [place] [address]    Culture   Final    NO GROWTH 5 DAYS Performed at [facility], [place] [address], [place], [place] [address]    Report Status 08/26/2022 FINAL  Final  Culture, blood (routine x 2)     Status: None   Collection Time: 08/21/22  7:58 PM   Specimen: BLOOD LEFT ARM  Result Value Ref Range Status   Specimen Description   Final    BLOOD LEFT ARM Performed at [facility], [place] [address] [place], [place] [address]    Special Requests   Final    BOTTLES DRAWN AEROBIC AND ANAEROBIC Blood Culture adequate volume Performed at [place] [address] [place], [place] [address]    Culture   Final    NO GROWTH 5 DAYS Performed at [facility], [place] [address] [place], [place] [address]    Report Status 08/26/2022 FINAL  Final  Fungus Culture With Stain     Status: None (Preliminary result)   Collection Time: 08/22/22 10:02 AM   Specimen: PATH Cytology Pleural fluid  Result Value Ref Range Status   Fungus Stain Final report  Final    Comment: (NOTE) Performed At: [facility] [place], [place] [id number] Rush Farmer MD [id number]    Fungus (Mycology) Culture PENDING  Incomplete   Fungal Source PLEURAL  Final    Comment: Performed at [facility], [place] [address], [place], [place] [address]  Fungus Culture Result     Status: None   Collection Time: 08/22/22 10:02 AM  Result Value Ref Range Status   Result 1 Comment  Final    Comment: (NOTE) KOH/Calcofluor preparation:  no fungus observed. Performed At: [facility] [place], [place] [id number] Rush Farmer MD [id number]   Urine Culture (for pregnant, neutropenic or urologic patients or patients with an indwelling urinary catheter)     Status: None   Collection Time: 09/06/22  8:40 AM   Specimen: Urine, Clean  Catch  Result Value Ref Range Status   Specimen Description   Final    URINE, CLEAN CATCH Performed at [facility], [place] [address], [place], [place] [address]    Special Requests   Final    NONE Performed at [facility], [place] [address] [place], [place] [address]    Culture   Final    NO GROWTH Performed at [facility], [place] [address], [place], [place] [address]    Report Status 09/07/2022 FINAL  Final    Labs: CBC: Recent Labs  Lab 09/07/22 0500 09/08/22 1113 09/09/22 0523 09/10/22 0609 09/11/22 0549 09/12/22 0238 09/13/22 0241  WBC 4.5 4.9 4.3 4.0 4.7 4.9 5.5  NEUTROABS 2.6 1.7 1.9  --   --   --   --   HGB 9.6* 11.8* 11.7* 10.6* 10.0* 10.2* 10.3*  HCT  29.6* 34.2* 34.2* 31.2* 29.8* 29.9* 30.9*  MCV 94.6 91.4 91.2 93.7 93.1 92.3 93.6  PLT 463* 556* 532* 489* 410* 425* [id number]   Basic Metabolic Panel: Recent Labs  Lab 09/09/22 0523 09/10/22 0609 09/11/22 0549 09/12/22 0238 09/13/22 0241  NA 134* 131* 133* 132* 134*  K 3.5 3.4* 3.3* 3.3* 3.5  CL 110 105 105 104 103  CO2 15* 19* 19* 22 23  GLUCOSE 109* 120* 105* 101* 107*  BUN 6 <5* <5* <5* <5*  CREATININE 0.50 0.48 0.48 0.53 0.51  CALCIUM 7.9* 6.9* 6.6* 6.9* 6.8*  MG 1.9 1.6* 2.1 2.0 2.0  PHOS 1.2* 2.1* 1.8* 2.0* 3.8   Liver Function Tests: Recent Labs  Lab 09/08/22 1113 09/09/22 0523 09/10/22 0609  AST 36 46* 35  ALT 41 52* 45*  ALKPHOS 492* 465* 424*  BILITOT 0.2* 0.6 0.4  PROT 6.0* 6.1* 5.9*  ALBUMIN 3.4* 3.2* 3.4*   CBG: No results for input(s): "GLUCAP" in the last 168 hours.  Discharge time spent: greater than 30 minutes.  Signed: Elmarie Shiley, MD [facility] 09/13/2022

## 2022-09-13 NOTE — Progress Notes (Signed)
Rounding Note    Patient Name: Karen [name] Date of Encounter: 09/13/2022  [facility] Cardiologist: Gardiner Rhyme   Subjective   57 year old with history of metastatic lung cancer.  She has metastatic pleural effusion.  She was found to have a small pericardial effusion.  She is currently breathing okay.   Inpatient Medications    Scheduled Meds:  aspirin EC  81 mg Oral Daily   calcium carbonate  1 tablet Oral BID WC   Chlorhexidine Gluconate Cloth  6 each Topical Daily   clonazePAM  1 mg Oral BID   enoxaparin (LOVENOX) injection  30 mg Subcutaneous Q24H   feeding supplement  237 mL Oral TID BM   fentaNYL  1 patch Transdermal Q72H   fluconazole  200 mg Oral Daily   metoCLOPramide  5 mg Oral TID AC & HS   nystatin  5 mL Oral QID   pantoprazole  40 mg Oral BID   phosphorus  500 mg Oral BID   sertraline  100 mg Oral BID   Continuous Infusions:  ampicillin-sulbactam (UNASYN) IV 200 mL/hr at 09/13/22 0526   PRN Meds: fentaNYL (SUBLIMAZE) injection, lip balm, ondansetron **OR** ondansetron (ZOFRAN) IV, oxymetazoline, prochlorperazine, sodium chloride, sodium chloride flush   Vital Signs    Vitals:   09/12/22 0347 09/12/22 1411 09/12/22 1950 09/13/22 0444  BP: 132/84 139/86 125/88 112/84  Pulse: 97 92 96 95  Resp:  16 16 16   Temp:  98.9 F (37.2 C) 98.4 F (36.9 C) 98.4 F (36.9 C)  TempSrc:  Oral Oral Oral  SpO2:  100% 96% 99%  Weight:      Height:        Intake/Output Summary (Last 24 hours) at 09/13/2022 1058 Last data filed at 09/13/2022 0931 Gross per 24 hour  Intake 574.15 ml  Output --  Net 574.15 ml      09/09/2022    9:32 AM 09/08/2022   11:42 AM 08/22/2022    1:46 AM  Last 3 Weights  Weight (lbs) 98 lb 12.8 oz 98 lb 12.8 oz 100 lb 15.5 oz  Weight (kg) 44.815 kg 44.815 kg 45.8 kg      Telemetry     NSR at 91.  Personally Reviewed  ECG    == - Personally Reviewed  Physical Exam   GEN: Thin, chronically ill-appearing  female, no acute distress Neck: No JVD Cardiac: RRR, no murmurs, rubs, or gallops.  Respiratory: Clear to auscultation bilaterally. GI: Soft, nontender, non-distended  MS: No edema; No deformity. Neuro:  Nonfocal  Psych: Normal affect   Labs    High Sensitivity Troponin:   Recent Labs  Lab 08/21/22 1930 08/21/22 2129  TROPONINIHS 3 3     Chemistry Recent Labs  Lab 09/08/22 1113 09/08/22 1113 09/09/22 0523 09/10/22 0609 09/11/22 0549 09/12/22 0238 09/13/22 0241  NA 137  --  134* 131* 133* 132* 134*  K 3.5  --  3.5 3.4* 3.3* 3.3* 3.5  CL 108  --  110 105 105 104 103  CO2 21*  --  15* 19* 19* 22 23  GLUCOSE 88  --  109* 120* 105* 101* 107*  BUN 7  --  6 <5* <5* <5* <5*  CREATININE 0.58   < > 0.50 0.48 0.48 0.53 0.51  CALCIUM 8.0*  --  7.9* 6.9* 6.6* 6.9* 6.8*  MG  --   --  1.9 1.6* 2.1 2.0 2.0  PROT 6.0*  --  6.1* 5.9*  --   --   --  ALBUMIN 3.4*  --  3.2* 3.4*  --   --   --   AST 36  --  46* 35  --   --   --   ALT 41  --  52* 45*  --   --   --   ALKPHOS 492*  --  465* 424*  --   --   --   BILITOT 0.2*  --  0.6 0.4  --   --   --   GFRNONAA >60   < > >60 >60 >60 >60 >60  ANIONGAP 8  --  9 7 9 6 8    < > = values in this interval not displayed.    Lipids No results for input(s): "CHOL", "TRIG", "HDL", "LABVLDL", "LDLCALC", "CHOLHDL" in the last 168 hours.  Hematology Recent Labs  Lab 09/11/22 0549 09/12/22 0238 09/13/22 0241  WBC 4.7 4.9 5.5  RBC 3.20* 3.24* 3.30*  HGB 10.0* 10.2* 10.3*  HCT 29.8* 29.9* 30.9*  MCV 93.1 92.3 93.6  MCH 31.3 31.5 31.2  MCHC 33.6 34.1 33.3  RDW 15.1 14.7 14.9  PLT 410* 425* 390   Thyroid No results for input(s): "TSH", "FREET4" in the last 168 hours.  BNPNo results for input(s): "BNP", "PROBNP" in the last 168 hours.  DDimer No results for input(s): "DDIMER" in the last 168 hours.   Radiology    No results found.  Cardiac Studies      Patient Profile     57 y.o. female  with metastatic lung cancer , found to have a  small pericardial effusion   Assessment & Plan      Pericardial effusion:   likely malignant since she has a malignant pleural effusion . 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    [place] will sign off.   Medication Recommendations:    Other recommendations (labs, testing, etc):    Follow up as an outpatient:     For questions or updates, please contact [place] Please consult [email or web address] for contact info under        Signed, Mertie Moores, MD  09/13/2022, 10:58 AM

## 2022-09-13 NOTE — Progress Notes (Signed)
PHARMACIST - PHYSICIAN COMMUNICATION DR:   Tyrell Antonio CONCERNING: Antibiotic IV to Oral Route Change Policy  RECOMMENDATION: This patient is receiving fluconazole by the intravenous route.  Based on criteria approved by the Pharmacy and Therapeutics Committee, the antibiotic(s) is/are being converted to the equivalent oral dose form(s).   DESCRIPTION: These criteria include: Patient being treated for a respiratory tract infection, urinary tract infection, cellulitis or clostridium difficile associated diarrhea if on metronidazole The patient is not neutropenic and does not exhibit a GI malabsorption state The patient is eating (either orally or via tube) and/or has been taking other orally administered medications for a least 24 hours The patient is improving clinically and has a Tmax < 100.5  If you have questions about this conversion, please contact the Pharmacy Department  []   [phone] )  [facility] []   [phone] )  [facility] []   [phone] )  [facility] []   [phone] )  [facility] [x]   [phone] )  [place], PharmD, BCPS 09/13/2022 7:45 AM

## 2022-09-14 ENCOUNTER — Telehealth: Payer: Self-pay

## 2022-09-14 NOTE — Transitions of Care (Post Inpatient/ED Visit) (Unsigned)
   09/14/2022  Name: Karen [name] MRN: [id number] DOB: 1965/11/17  Today's TOC FU Call Status: Today's TOC FU Call Status:: Unsuccessul Call (1st Attempt) Unsuccessful Call (1st Attempt) Date: 09/14/22  Attempted to reach the patient regarding the most recent Inpatient/ED visit.  Follow Up Plan: Additional outreach attempts will be made to reach the patient to complete the Transitions of Care (Post Inpatient/ED visit) call.   Signature Juanda Crumble, [place] Direct Dial [phone]

## 2022-09-15 ENCOUNTER — Other Ambulatory Visit (HOSPITAL_COMMUNITY): Payer: Self-pay

## 2022-09-15 NOTE — Transitions of Care (Post Inpatient/ED Visit) (Signed)
   09/15/2022  Name: Karen [name] MRN: [id number] DOB: Apr 20, 1966  Today's TOC FU Call Status: Today's TOC FU Call Status:: Unsuccessful Call (2nd Attempt) Unsuccessful Call (1st Attempt) Date: 09/14/22 Unsuccessful Call (2nd Attempt) Date: 09/15/22  Attempted to reach the patient regarding the most recent Inpatient/ED visit.  Follow Up Plan: No further outreach attempts will be made at this time. We have been unable to contact the patient.  Signature Juanda Crumble, [place] Direct Dial [phone]

## 2022-09-16 ENCOUNTER — Other Ambulatory Visit: Payer: Self-pay

## 2022-09-16 ENCOUNTER — Other Ambulatory Visit: Payer: Self-pay | Admitting: Physician Assistant

## 2022-09-16 DIAGNOSIS — C3490 Malignant neoplasm of unspecified part of unspecified bronchus or lung: Secondary | ICD-10-CM

## 2022-09-17 ENCOUNTER — Inpatient Hospital Stay (HOSPITAL_BASED_OUTPATIENT_CLINIC_OR_DEPARTMENT_OTHER): Payer: Commercial Managed Care - HMO | Admitting: Physician Assistant

## 2022-09-17 ENCOUNTER — Other Ambulatory Visit: Payer: Commercial Managed Care - HMO

## 2022-09-17 ENCOUNTER — Encounter: Payer: Self-pay | Admitting: Nurse Practitioner

## 2022-09-17 ENCOUNTER — Inpatient Hospital Stay (HOSPITAL_BASED_OUTPATIENT_CLINIC_OR_DEPARTMENT_OTHER): Payer: Commercial Managed Care - HMO | Admitting: Nurse Practitioner

## 2022-09-17 ENCOUNTER — Inpatient Hospital Stay: Payer: Commercial Managed Care - HMO

## 2022-09-17 ENCOUNTER — Other Ambulatory Visit: Payer: Self-pay

## 2022-09-17 VITALS — BP 119/84 | HR 113 | Temp 97.7°F | Resp 18 | Wt 93.0 lb

## 2022-09-17 DIAGNOSIS — C3490 Malignant neoplasm of unspecified part of unspecified bronchus or lung: Secondary | ICD-10-CM

## 2022-09-17 DIAGNOSIS — R530 Neoplastic (malignant) related fatigue: Secondary | ICD-10-CM

## 2022-09-17 DIAGNOSIS — Z515 Encounter for palliative care: Secondary | ICD-10-CM | POA: Diagnosis not present

## 2022-09-17 DIAGNOSIS — F32A Depression, unspecified: Secondary | ICD-10-CM | POA: Diagnosis not present

## 2022-09-17 DIAGNOSIS — G43909 Migraine, unspecified, not intractable, without status migrainosus: Secondary | ICD-10-CM | POA: Diagnosis not present

## 2022-09-17 DIAGNOSIS — C419 Malignant neoplasm of bone and articular cartilage, unspecified: Secondary | ICD-10-CM | POA: Diagnosis not present

## 2022-09-17 DIAGNOSIS — K589 Irritable bowel syndrome without diarrhea: Secondary | ICD-10-CM | POA: Diagnosis not present

## 2022-09-17 DIAGNOSIS — F419 Anxiety disorder, unspecified: Secondary | ICD-10-CM | POA: Diagnosis not present

## 2022-09-17 DIAGNOSIS — R630 Anorexia: Secondary | ICD-10-CM | POA: Diagnosis not present

## 2022-09-17 DIAGNOSIS — I10 Essential (primary) hypertension: Secondary | ICD-10-CM | POA: Diagnosis not present

## 2022-09-17 DIAGNOSIS — G893 Neoplasm related pain (acute) (chronic): Secondary | ICD-10-CM | POA: Diagnosis not present

## 2022-09-17 DIAGNOSIS — Z79899 Other long term (current) drug therapy: Secondary | ICD-10-CM | POA: Diagnosis not present

## 2022-09-17 LAB — CMP (CANCER CENTER ONLY)
ALT: 42 U/L (ref 0–44)
AST: 31 U/L (ref 15–41)
Albumin: 3.8 g/dL (ref 3.5–5.0)
Alkaline Phosphatase: 309 U/L — ABNORMAL HIGH (ref 38–126)
Anion gap: 7 (ref 5–15)
BUN: 17 mg/dL (ref 6–20)
CO2: 23 mmol/L (ref 22–32)
Calcium: 8.4 mg/dL — ABNORMAL LOW (ref 8.9–10.3)
Chloride: 109 mmol/L (ref 98–111)
Creatinine: 0.81 mg/dL (ref 0.44–1.00)
GFR, Estimated: >60 mL/min (ref >60)
Glucose, Bld: 103 mg/dL — ABNORMAL HIGH (ref 70–99)
Potassium: 4 mmol/L (ref 3.5–5.1)
Sodium: 139 mmol/L (ref 135–145)
Total Bilirubin: 0.3 mg/dL (ref 0.3–1.2)
Total Protein: 6.5 g/dL (ref 6.5–8.1)

## 2022-09-17 LAB — CBC WITH DIFFERENTIAL (CANCER CENTER ONLY)
Abs Immature Granulocytes: 0.02 10*3/uL (ref 0.00–0.07)
Basophils Absolute: 0.1 10*3/uL (ref 0.0–0.1)
Basophils Relative: 1 %
Eosinophils Absolute: 0 10*3/uL (ref 0.0–0.5)
Eosinophils Relative: 0 %
HCT: 34.7 % — ABNORMAL LOW (ref 36.0–46.0)
Hemoglobin: 11.7 g/dL — ABNORMAL LOW (ref 12.0–15.0)
Immature Granulocytes: 0 %
Lymphocytes Relative: 44 %
Lymphs Abs: 3.5 10*3/uL (ref 0.7–4.0)
MCH: 31.7 pg (ref 26.0–34.0)
MCHC: 33.7 g/dL (ref 30.0–36.0)
MCV: 94 fL (ref 80.0–100.0)
Monocytes Absolute: 0.4 10*3/uL (ref 0.1–1.0)
Monocytes Relative: 5 %
Neutro Abs: 4 10*3/uL (ref 1.7–7.7)
Neutrophils Relative %: 50 %
Platelet Count: 371 10*3/uL (ref 150–400)
RBC: 3.69 MIL/uL — ABNORMAL LOW (ref 3.87–5.11)
RDW: 15.4 % (ref 11.5–15.5)
WBC Count: 8 10*3/uL (ref 4.0–10.5)
nRBC: 0 % (ref 0.0–0.2)

## 2022-09-17 LAB — MAGNESIUM: Magnesium: 2 mg/dL (ref 1.7–2.4)

## 2022-09-17 LAB — ACID FAST CULTURE WITH REFLEXED SENSITIVITIES (MYCOBACTERIA): Acid Fast Culture: NEGATIVE

## 2022-09-17 NOTE — Progress Notes (Signed)
Karen [name] CSW Progress Note  [employer] representative met with patient to assess needs.  Spanish Interpreter was present.  Patient had minimal time to meet with CSW today.  Provided her with contact information and resource phone numbers.  Also provided her with a bag of food from the food pantry.    Karen Pickle Laquenta Whitsell, LCSW
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
## 2022-09-19 ENCOUNTER — Encounter: Payer: Self-pay | Admitting: Hematology
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
## 2022-09-20 ENCOUNTER — Encounter: Payer: Self-pay | Admitting: Student

## 2022-09-20 ENCOUNTER — Ambulatory Visit (INDEPENDENT_AMBULATORY_CARE_PROVIDER_SITE_OTHER): Payer: Commercial Managed Care - HMO | Admitting: Student

## 2022-09-20 VITALS — BP 114/80 | HR 111 | Temp 97.2°F | Ht 62.0 in | Wt 91.8 lb

## 2022-09-20 DIAGNOSIS — J910 Malignant pleural effusion: Secondary | ICD-10-CM

## 2022-09-20 DIAGNOSIS — K5900 Constipation, unspecified: Secondary | ICD-10-CM | POA: Diagnosis not present

## 2022-09-20 MED ORDER — POLYETHYLENE GLYCOL 3350 17 GM/SCOOP PO POWD: ORAL | 0 refills | Status: DC

## 2022-09-20 NOTE — Patient Instructions (Addendum)
-   would add miralax 1-2 scoop with glass of water with each meal until you have 1 soft bowel movement. Then use 1 scoop daily with glass of water as needed to have one soft bowel movement daily - call us if you feel more short of breath and we can try to get you scheduled quickly for thoracentesis

## 2022-09-21 ENCOUNTER — Inpatient Hospital Stay: Payer: Commercial Managed Care - HMO | Attending: Hematology | Admitting: Licensed Clinical Social Worker

## 2022-09-21 DIAGNOSIS — G893 Neoplasm related pain (acute) (chronic): Secondary | ICD-10-CM | POA: Insufficient documentation

## 2022-09-21 DIAGNOSIS — F32A Depression, unspecified: Secondary | ICD-10-CM | POA: Insufficient documentation

## 2022-09-21 DIAGNOSIS — Z7982 Long term (current) use of aspirin: Secondary | ICD-10-CM | POA: Insufficient documentation

## 2022-09-21 DIAGNOSIS — C3490 Malignant neoplasm of unspecified part of unspecified bronchus or lung: Secondary | ICD-10-CM | POA: Insufficient documentation

## 2022-09-21 DIAGNOSIS — Z79899 Other long term (current) drug therapy: Secondary | ICD-10-CM | POA: Insufficient documentation

## 2022-09-21 DIAGNOSIS — E43 Unspecified severe protein-calorie malnutrition: Secondary | ICD-10-CM | POA: Insufficient documentation

## 2022-09-21 DIAGNOSIS — F419 Anxiety disorder, unspecified: Secondary | ICD-10-CM | POA: Insufficient documentation

## 2022-09-21 NOTE — Progress Notes (Signed)
[place] CSW Progress Note  Clinical Education officer, museum returned husband's call.  Treddrick stated patient wishes to apply for disability.  Provided education regarding the process.  He is to obtain patient's mother's maiden name and her father's name.  CSW to follow up with patient.    Rodman Pickle Anas Reister, LCSW

## 2022-09-23 ENCOUNTER — Encounter: Payer: Self-pay | Admitting: Hematology

## 2022-09-24 LAB — FUNGAL ORGANISM REFLEX

## 2022-09-24 LAB — FUNGUS CULTURE WITH STAIN

## 2022-09-24 LAB — FUNGUS CULTURE RESULT

## 2022-09-29 ENCOUNTER — Other Ambulatory Visit (HOSPITAL_COMMUNITY): Payer: Self-pay

## 2022-09-29 ENCOUNTER — Other Ambulatory Visit: Payer: Self-pay

## 2022-09-29 DIAGNOSIS — G893 Neoplasm related pain (acute) (chronic): Secondary | ICD-10-CM

## 2022-09-29 DIAGNOSIS — Z515 Encounter for palliative care: Secondary | ICD-10-CM

## 2022-09-29 DIAGNOSIS — C3490 Malignant neoplasm of unspecified part of unspecified bronchus or lung: Secondary | ICD-10-CM

## 2022-09-29 MED ORDER — DABRAFENIB MESYLATE 75 MG PO CAPS
150.0000 mg | ORAL_CAPSULE | Freq: Two times a day (BID) | ORAL | 2 refills | Status: DC
Start: 2022-09-29 — End: 2022-10-05
  Filled 2022-09-29: qty 120, 30d supply, fill #0

## 2022-09-29 MED ORDER — TRAMETINIB DIMETHYL SULFOXIDE 2 MG PO TABS
2.0000 mg | ORAL_TABLET | Freq: Every day | ORAL | 2 refills | Status: DC
Start: 2022-09-29 — End: 2022-10-05
  Filled 2022-09-29: qty 30, 30d supply, fill #0

## 2022-09-30 ENCOUNTER — Encounter: Payer: Self-pay | Admitting: Hematology

## 2022-09-30 ENCOUNTER — Other Ambulatory Visit (HOSPITAL_COMMUNITY): Payer: Self-pay

## 2022-09-30 MED ORDER — FENTANYL 25 MCG/HR TD PT72
1.0000 | MEDICATED_PATCH | TRANSDERMAL | 0 refills | Status: DC
Start: 2022-09-30 — End: 2022-10-05

## 2022-10-02 ENCOUNTER — Other Ambulatory Visit (HOSPITAL_COMMUNITY): Payer: Self-pay

## 2022-10-05 ENCOUNTER — Other Ambulatory Visit: Payer: Self-pay

## 2022-10-05 ENCOUNTER — Other Ambulatory Visit (HOSPITAL_COMMUNITY): Payer: Self-pay

## 2022-10-05 ENCOUNTER — Inpatient Hospital Stay (HOSPITAL_BASED_OUTPATIENT_CLINIC_OR_DEPARTMENT_OTHER): Payer: Commercial Managed Care - HMO | Admitting: Hematology

## 2022-10-05 ENCOUNTER — Inpatient Hospital Stay: Payer: Commercial Managed Care - HMO

## 2022-10-05 ENCOUNTER — Encounter: Payer: Self-pay | Admitting: Hematology

## 2022-10-05 DIAGNOSIS — F32A Depression, unspecified: Secondary | ICD-10-CM | POA: Diagnosis not present

## 2022-10-05 DIAGNOSIS — Z7982 Long term (current) use of aspirin: Secondary | ICD-10-CM | POA: Diagnosis not present

## 2022-10-05 DIAGNOSIS — F419 Anxiety disorder, unspecified: Secondary | ICD-10-CM | POA: Diagnosis not present

## 2022-10-05 DIAGNOSIS — Z515 Encounter for palliative care: Secondary | ICD-10-CM | POA: Diagnosis not present

## 2022-10-05 DIAGNOSIS — E43 Unspecified severe protein-calorie malnutrition: Secondary | ICD-10-CM | POA: Diagnosis not present

## 2022-10-05 DIAGNOSIS — Z79899 Other long term (current) drug therapy: Secondary | ICD-10-CM | POA: Diagnosis not present

## 2022-10-05 DIAGNOSIS — C3490 Malignant neoplasm of unspecified part of unspecified bronchus or lung: Secondary | ICD-10-CM | POA: Diagnosis present

## 2022-10-05 DIAGNOSIS — G893 Neoplasm related pain (acute) (chronic): Secondary | ICD-10-CM

## 2022-10-05 LAB — CBC WITH DIFFERENTIAL (CANCER CENTER ONLY)
Abs Immature Granulocytes: 0.03 10*3/uL (ref 0.00–0.07)
Basophils Absolute: 0 10*3/uL (ref 0.0–0.1)
Basophils Relative: 1 %
Eosinophils Absolute: 0.1 10*3/uL (ref 0.0–0.5)
Eosinophils Relative: 2 %
HCT: 30.8 % — ABNORMAL LOW (ref 36.0–46.0)
Hemoglobin: 10.6 g/dL — ABNORMAL LOW (ref 12.0–15.0)
Immature Granulocytes: 0 %
Lymphocytes Relative: 33 %
Lymphs Abs: 2.2 10*3/uL (ref 0.7–4.0)
MCH: 32.7 pg (ref 26.0–34.0)
MCHC: 34.4 g/dL (ref 30.0–36.0)
MCV: 95.1 fL (ref 80.0–100.0)
Monocytes Absolute: 0.5 10*3/uL (ref 0.1–1.0)
Monocytes Relative: 8 %
Neutro Abs: 3.8 10*3/uL (ref 1.7–7.7)
Neutrophils Relative %: 56 %
Platelet Count: 298 10*3/uL (ref 150–400)
RBC: 3.24 MIL/uL — ABNORMAL LOW (ref 3.87–5.11)
RDW: 15.6 % — ABNORMAL HIGH (ref 11.5–15.5)
WBC Count: 6.8 10*3/uL (ref 4.0–10.5)
nRBC: 0 % (ref 0.0–0.2)

## 2022-10-05 LAB — CMP (CANCER CENTER ONLY)
ALT: 20 U/L (ref 0–44)
AST: 20 U/L (ref 15–41)
Albumin: 3.5 g/dL (ref 3.5–5.0)
Alkaline Phosphatase: 97 U/L (ref 38–126)
Anion gap: 5 (ref 5–15)
BUN: 10 mg/dL (ref 6–20)
CO2: 23 mmol/L (ref 22–32)
Calcium: 8.7 mg/dL — ABNORMAL LOW (ref 8.9–10.3)
Chloride: 112 mmol/L — ABNORMAL HIGH (ref 98–111)
Creatinine: 0.59 mg/dL (ref 0.44–1.00)
GFR, Estimated: >60 mL/min (ref >60)
Glucose, Bld: 112 mg/dL — ABNORMAL HIGH (ref 70–99)
Potassium: 4 mmol/L (ref 3.5–5.1)
Sodium: 140 mmol/L (ref 135–145)
Total Bilirubin: 0.2 mg/dL — ABNORMAL LOW (ref 0.3–1.2)
Total Protein: 6.1 g/dL — ABNORMAL LOW (ref 6.5–8.1)

## 2022-10-05 MED ORDER — FENTANYL 25 MCG/HR TD PT72
1.0000 | MEDICATED_PATCH | TRANSDERMAL | 0 refills | Status: DC
Start: 2022-10-05 — End: 2022-11-04

## 2022-10-05 MED ORDER — METOCLOPRAMIDE HCL 5 MG PO TABS: 5.0000 mg | ORAL_TABLET | Freq: Three times a day (TID) | ORAL | 0 refills | Status: DC

## 2022-10-05 MED ORDER — DEXAMETHASONE 0.5 MG PO TABS
0.5000 mg | ORAL_TABLET | Freq: Every day | ORAL | 1 refills | Status: DC
  Filled 2022-10-05 – 2022-11-24 (×3): qty 30, 30d supply, fill #0
  Filled 2022-12-23 – 2023-01-05 (×2): qty 30, 30d supply, fill #1

## 2022-10-05 MED ORDER — TRAMETINIB DIMETHYL SULFOXIDE 2 MG PO TABS
2.0000 mg | ORAL_TABLET | Freq: Every day | ORAL | 2 refills | Status: DC
Start: 2022-10-05 — End: 2022-12-01
  Filled 2022-10-05 – 2022-11-24 (×3): qty 30, 30d supply, fill #0

## 2022-10-05 MED ORDER — DABRAFENIB MESYLATE 75 MG PO CAPS
150.0000 mg | ORAL_CAPSULE | Freq: Two times a day (BID) | ORAL | 2 refills | Status: DC
Start: 2022-10-05 — End: 2023-01-06
  Filled 2022-10-05 – 2022-12-20 (×3): qty 120, 30d supply, fill #0

## 2022-10-05 MED ORDER — PANTOPRAZOLE SODIUM 40 MG PO TBEC: 40.0000 mg | DELAYED_RELEASE_TABLET | Freq: Two times a day (BID) | ORAL | 1 refills | Status: DC
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
## 2022-10-06 ENCOUNTER — Other Ambulatory Visit: Payer: Self-pay

## 2022-10-06 ENCOUNTER — Telehealth: Payer: Self-pay | Admitting: Hematology

## 2022-10-06 NOTE — Telephone Encounter (Signed)
Attempted to call patient's spouse about new appointments, unable to leave a voicemail. Mailing patient reminders.

## 2022-10-07 ENCOUNTER — Other Ambulatory Visit: Payer: Self-pay

## 2022-10-07 ENCOUNTER — Other Ambulatory Visit (HOSPITAL_COMMUNITY): Payer: Self-pay

## 2022-10-08 ENCOUNTER — Telehealth: Payer: Self-pay | Admitting: Nurse Practitioner

## 2022-10-12 ENCOUNTER — Encounter: Payer: Self-pay | Admitting: Hematology

## 2022-10-14 ENCOUNTER — Other Ambulatory Visit: Payer: Self-pay

## 2022-10-14 ENCOUNTER — Other Ambulatory Visit (HOSPITAL_COMMUNITY): Payer: Self-pay

## 2022-10-14 ENCOUNTER — Other Ambulatory Visit: Payer: Self-pay | Admitting: Hematology

## 2022-10-15 MED ORDER — CLONAZEPAM 1 MG PO TABS: 1.0000 mg | ORAL_TABLET | Freq: Two times a day (BID) | ORAL | 0 refills | Status: DC

## 2022-10-16 ENCOUNTER — Encounter (HOSPITAL_COMMUNITY): Payer: Self-pay

## 2022-10-16 ENCOUNTER — Other Ambulatory Visit: Payer: Self-pay

## 2022-10-16 ENCOUNTER — Inpatient Hospital Stay (HOSPITAL_COMMUNITY)
Admission: EM | Admit: 2022-10-16 | Discharge: 2022-10-23 | DRG: 392 | Disposition: A | Discharge status: Home / Self Care | Payer: Commercial Managed Care - HMO | Attending: Internal Medicine | Admitting: Internal Medicine

## 2022-10-16 DIAGNOSIS — Z681 Body mass index (BMI) 19 or less, adult: Secondary | ICD-10-CM

## 2022-10-16 DIAGNOSIS — C3490 Malignant neoplasm of unspecified part of unspecified bronchus or lung: Secondary | ICD-10-CM | POA: Diagnosis present

## 2022-10-16 DIAGNOSIS — M546 Pain in thoracic spine: Secondary | ICD-10-CM | POA: Diagnosis present

## 2022-10-16 DIAGNOSIS — R112 Nausea with vomiting, unspecified: Secondary | ICD-10-CM | POA: Diagnosis present

## 2022-10-16 DIAGNOSIS — Z7982 Long term (current) use of aspirin: Secondary | ICD-10-CM

## 2022-10-16 DIAGNOSIS — Z8249 Family history of ischemic heart disease and other diseases of the circulatory system: Secondary | ICD-10-CM

## 2022-10-16 DIAGNOSIS — F419 Anxiety disorder, unspecified: Secondary | ICD-10-CM | POA: Diagnosis present

## 2022-10-16 DIAGNOSIS — E876 Hypokalemia: Secondary | ICD-10-CM

## 2022-10-16 DIAGNOSIS — K5900 Constipation, unspecified: Secondary | ICD-10-CM | POA: Insufficient documentation

## 2022-10-16 DIAGNOSIS — E8721 Acute metabolic acidosis: Secondary | ICD-10-CM | POA: Diagnosis present

## 2022-10-16 DIAGNOSIS — Z91419 Personal history of unspecified adult abuse: Secondary | ICD-10-CM

## 2022-10-16 DIAGNOSIS — T451X5A Adverse effect of antineoplastic and immunosuppressive drugs, initial encounter: Secondary | ICD-10-CM | POA: Diagnosis present

## 2022-10-16 DIAGNOSIS — G894 Chronic pain syndrome: Secondary | ICD-10-CM | POA: Diagnosis present

## 2022-10-16 DIAGNOSIS — E781 Pure hyperglyceridemia: Secondary | ICD-10-CM | POA: Diagnosis present

## 2022-10-16 DIAGNOSIS — D638 Anemia in other chronic diseases classified elsewhere: Secondary | ICD-10-CM | POA: Diagnosis present

## 2022-10-16 DIAGNOSIS — R1084 Generalized abdominal pain: Secondary | ICD-10-CM

## 2022-10-16 DIAGNOSIS — E441 Mild protein-calorie malnutrition: Secondary | ICD-10-CM | POA: Diagnosis present

## 2022-10-16 DIAGNOSIS — Z8711 Personal history of peptic ulcer disease: Secondary | ICD-10-CM

## 2022-10-16 DIAGNOSIS — I10 Essential (primary) hypertension: Secondary | ICD-10-CM | POA: Diagnosis present

## 2022-10-16 DIAGNOSIS — R1110 Vomiting, unspecified: Secondary | ICD-10-CM | POA: Diagnosis present

## 2022-10-16 DIAGNOSIS — Z888 Allergy status to other drugs, medicaments and biological substances status: Secondary | ICD-10-CM

## 2022-10-16 DIAGNOSIS — R636 Underweight: Secondary | ICD-10-CM | POA: Diagnosis present

## 2022-10-16 DIAGNOSIS — G893 Neoplasm related pain (acute) (chronic): Secondary | ICD-10-CM | POA: Diagnosis present

## 2022-10-16 DIAGNOSIS — Z8673 Personal history of transient ischemic attack (TIA), and cerebral infarction without residual deficits: Secondary | ICD-10-CM

## 2022-10-16 DIAGNOSIS — Z515 Encounter for palliative care: Secondary | ICD-10-CM

## 2022-10-16 DIAGNOSIS — K589 Irritable bowel syndrome without diarrhea: Secondary | ICD-10-CM | POA: Diagnosis present

## 2022-10-16 DIAGNOSIS — Z8616 Personal history of COVID-19: Secondary | ICD-10-CM

## 2022-10-16 DIAGNOSIS — K5903 Drug induced constipation: Principal | ICD-10-CM | POA: Diagnosis present

## 2022-10-16 DIAGNOSIS — F32A Depression, unspecified: Secondary | ICD-10-CM | POA: Diagnosis present

## 2022-10-16 DIAGNOSIS — Z90710 Acquired absence of both cervix and uterus: Secondary | ICD-10-CM

## 2022-10-16 DIAGNOSIS — Z79891 Long term (current) use of opiate analgesic: Secondary | ICD-10-CM

## 2022-10-16 DIAGNOSIS — Z885 Allergy status to narcotic agent status: Secondary | ICD-10-CM

## 2022-10-16 DIAGNOSIS — T40605A Adverse effect of unspecified narcotics, initial encounter: Secondary | ICD-10-CM | POA: Diagnosis present

## 2022-10-16 DIAGNOSIS — Z79899 Other long term (current) drug therapy: Secondary | ICD-10-CM

## 2022-10-16 DIAGNOSIS — E871 Hypo-osmolality and hyponatremia: Secondary | ICD-10-CM | POA: Diagnosis present

## 2022-10-16 LAB — CBC
HCT: 34.8 % — ABNORMAL LOW (ref 36.0–46.0)
Hemoglobin: 11.4 g/dL — ABNORMAL LOW (ref 12.0–15.0)
MCH: 31.8 pg (ref 26.0–34.0)
MCHC: 32.8 g/dL (ref 30.0–36.0)
MCV: 96.9 fL (ref 80.0–100.0)
Platelets: 340 10*3/uL (ref 150–400)
RBC: 3.59 MIL/uL — ABNORMAL LOW (ref 3.87–5.11)
RDW: 14.6 % (ref 11.5–15.5)
WBC: 5.6 10*3/uL (ref 4.0–10.5)
nRBC: 0 % (ref 0.0–0.2)

## 2022-10-16 MED ORDER — SODIUM CHLORIDE 0.9 % IV BOLUS
500.0000 mL | Freq: Once | INTRAVENOUS | Status: AC
  Administered 2022-10-16: 500 mL via INTRAVENOUS

## 2022-10-16 MED ORDER — ONDANSETRON HCL 4 MG/2ML IJ SOLN
4.0000 mg | Freq: Once | INTRAMUSCULAR | Status: AC
  Administered 2022-10-16: 4 mg via INTRAVENOUS
  Filled 2022-10-16: qty 2

## 2022-10-16 MED ORDER — MORPHINE SULFATE (PF) 4 MG/ML IV SOLN
4.0000 mg | Freq: Once | INTRAVENOUS | Status: AC
  Administered 2022-10-16: 4 mg via INTRAVENOUS
  Filled 2022-10-16: qty 1

## 2022-10-16 NOTE — ED Triage Notes (Signed)
Pt bib ems from home for nausea and worsening weakness after having a chemo tx today. Pt dx with lung cancer with mets to the bones. Pt given 4mg  zofran by EMS and took 4mg  PO zofran at home with no relief.

## 2022-10-16 NOTE — ED Provider Notes (Signed)
[place] EMERGENCY DEPARTMENT AT [facility] Provider Note   CSN: [id number] Arrival date & time: 10/16/22  2152     History {Add pertinent medical, surgical, social history, OB history to HPI:1} Chief Complaint  Patient presents with   Nausea    Pt bib ems from home for nausea and worsening weakness after having a chemo tx today. Pt dx with lung cancer with mets to the bones. Pt given 4mg  zofran by EMS and took 4mg  PO zofran at home with no relief.    Karen [name] is a 57 y.o. female.  57 year old female brought in by EMS from home with periumbilical abdominal pain with nausea and vomiting x 2 days.  Per EMS note, patient week after chemo today however patient notes that she has not had chemo for several months.  History of metastatic lung cancer.  Denies fevers, chills, shortness of breath.  Last bowel movement 2 days ago, no urinary symptoms.  Patient has been taking Zofran at home without improvement in her symptoms.  Prior abdominal surgeries include hysterectomy.  No other complaints or concerns.       Home Medications Prior to Admission medications   Medication Sig Start Date End Date Taking? Authorizing Provider  acetaminophen (TYLENOL) 325 MG tablet Take 2 tablets (650 mg total) by mouth every 6 (six) hours as needed for mild pain (or Fever >/= 101). 09/07/22   Drema Dallas, MD  albuterol (VENTOLIN HFA) 108 (90 Base) MCG/ACT inhaler Inhale 2 puffs into the lungs every 6 (six) hours as needed for wheezing or shortness of breath. Patient taking differently: Inhale 2 puffs into the lungs See admin instructions. Inhale 2 puffs into the lungs every 2-4 hours as needed for shortness of breath or wheezing 05/31/22   Quincy Simmonds, MD  aspirin EC 81 MG tablet Take 1 tablet (81 mg total) by mouth daily. Swallow whole. 08/17/22   Lyndle Herrlich, MD  clonazePAM (KLONOPIN) 1 MG tablet Take 1 tablet (1 mg total) by mouth 2 (two) times daily. 10/15/22   Mapp,  Gaylyn Cheers, MD  dabrafenib mesylate (TAFINLAR) 75 MG capsule Take 2 capsules (150 mg total) by mouth 2 (two) times daily. Take on an empty stomach 1 hour before or 2 hours after meals. 10/05/22   Johney Maine, MD  dexamethasone (DECADRON) 0.5 MG tablet Take 1 tablet (0.5 mg total) by mouth daily with breakfast. 10/05/22   Johney Maine, MD  fentaNYL (DURAGESIC) 25 MCG/HR Place 1 patch onto the skin every 3 (three) days. 10/05/22   Johney Maine, MD  folic acid (FOLVITE) 1 MG tablet Take 1 mg by mouth daily.    [provider]  metoCLOPramide (REGLAN) 5 MG tablet TAKE 1 TABLET BY MOUTH 3 TIMES DAILY BEFORE MEALS. 10/14/22   Johney Maine, MD  ondansetron (ZOFRAN-ODT) 8 MG disintegrating tablet Dissolve 1 tablet (8 mg total) by mouth every 8 (eight) hours as needed for nausea or vomiting. 09/13/22   Regalado, Belkys A, MD  pantoprazole (PROTONIX) 40 MG tablet Take 1 tablet (40 mg total) by mouth 2 (two) times daily. 10/05/22 12/04/22  Johney Maine, MD  polyethylene glycol powder Lifescape) 17 GM/SCOOP powder 1 scoop daily as needed to have one soft bowel movement daily 09/20/22   Omar Person, MD  prochlorperazine (COMPAZINE) 10 MG tablet Take 1 tablet (10 mg total) by mouth every 6 (six) hours as needed for refractory nausea / vomiting. 09/13/22   Regalado, [employer]  A, MD  senna (SENOKOT) 8.6 MG TABS tablet Take 1 tablet (8.6 mg total) by mouth at bedtime. 09/06/22   Drema Dallas, MD  sertraline (ZOLOFT) 100 MG tablet Take 1 tablet (100 mg total) by mouth 2 (two) times daily. 09/13/22   Regalado, Belkys A, MD  SUMAtriptan (IMITREX) 50 MG tablet Take 50 mg by mouth See admin instructions. Take 50 mg total by mouth once as needed for migraine. May repeat in 2 hours if headache persists or recurs.    [provider]  trametinib dimethyl sulfoxide (MEKINIST) 2 MG tablet Take 1 tablet (2 mg total) by mouth daily. Take 1 hour before or 2 hours after a meal.  Store refrigerated in original container. 10/05/22   Johney Maine, MD  traZODone (DESYREL) 150 MG tablet TOME UNA TABLETA POR VIA ORAL AL ACOSTARSE CUANDO SEA NECESARIO FOR SLEEP 09/14/22   Mapp, Gaylyn Cheers, MD      Allergies    Percocet [oxycodone-acetaminophen], Lidocaine, Other, and Hydromorphone hcl    Review of Systems   Review of Systems Negative except as per HPI Physical Exam Updated Vital Signs BP (!) 145/88 (BP Location: Left Arm)   Pulse 89   Temp 97.7 F (36.5 C) (Oral)   Resp 16   Ht 5\' 2"  (1.575 m)   Wt 43.1 kg   LMP 02/08/2006   SpO2 100%   BMI 17.38 kg/m  Physical Exam Vitals and nursing note reviewed.  Constitutional:      General: She is not in acute distress.    Appearance: She is well-developed. She is not diaphoretic.  HENT:     Head: Normocephalic and atraumatic.  Cardiovascular:     Rate and Rhythm: Normal rate and regular rhythm.     Heart sounds: Normal heart sounds.  Pulmonary:     Effort: Pulmonary effort is normal.     Breath sounds: Normal breath sounds.  Abdominal:     Palpations: Abdomen is soft.     Tenderness: There is abdominal tenderness in the periumbilical area and left lower quadrant. There is no guarding or rebound.  Skin:    General: Skin is warm and dry.     Findings: No erythema or rash.  Neurological:     Mental Status: She is alert and oriented to person, place, and time.  Psychiatric:        Behavior: Behavior normal.     ED Results / Procedures / Treatments   Labs (all labs ordered are listed, but only abnormal results are displayed) Labs Reviewed  CBC - Abnormal; Notable for the following components:      Result Value   RBC 3.59 (*)    Hemoglobin 11.4 (*)    HCT 34.8 (*)    All other components within normal limits  COMPREHENSIVE METABOLIC PANEL  LIPASE, BLOOD  URINALYSIS, ROUTINE W REFLEX MICROSCOPIC    EKG None  Radiology No results found.  Procedures Procedures  {Document cardiac monitor,  telemetry assessment procedure when appropriate:1}  Medications Ordered in ED Medications  sodium chloride 0.9 % bolus 500 mL (has no administration in time range)  ondansetron (ZOFRAN) injection 4 mg (has no administration in time range)  morphine (PF) 4 MG/ML injection 4 mg (has no administration in time range)    ED Course/ Medical Decision Making/ A&P   {   Click here for ABCD2, HEART and other calculatorsREFRESH Note before signing :1}  Medical Decision Making Amount and/or Complexity of Data Reviewed Labs: ordered.   This patient presents to the ED for concern of ***, this involves an extensive number of treatment options, and is a complaint that carries with it a high risk of complications and morbidity.  The differential diagnosis includes ***   Co morbidities that complicate the patient evaluation  ***   Additional history obtained:  Additional history obtained from *** External records from outside source obtained and reviewed including ***   Lab Tests:  I Ordered, and personally interpreted labs.  The pertinent results include:  ***   Imaging Studies ordered:  I ordered imaging studies including ***  I independently visualized and interpreted imaging which showed *** I agree with the radiologist interpretation   Cardiac Monitoring: / EKG:  The patient was maintained on a cardiac monitor.  I personally viewed and interpreted the cardiac monitored which showed an underlying rhythm of: ***   Consultations Obtained:  I requested consultation with the ***,  and discussed lab and imaging findings as well as pertinent plan - they recommend: ***   Problem List / ED Course / Critical interventions / Medication management  *** I ordered medication including ***  for ***  Reevaluation of the patient after these medicines showed that the patient {resolved/improved/worsened:23923::"improved"} I have reviewed the patients home medicines  and have made adjustments as needed   Social Determinants of Health:  ***   Test / Admission - Considered:  ***   {Document critical care time when appropriate:1} {Document review of labs and clinical decision tools ie heart score, Chads2Vasc2 etc:1}  {Document your independent review of radiology images, and any outside records:1} {Document your discussion with family members, caretakers, and with consultants:1} {Document social determinants of health affecting pt's care:1} {Document your decision making why or why not admission, treatments were needed:1} Final Clinical Impression(s) / ED Diagnoses Final diagnoses:  None    Rx / DC Orders ED Discharge Orders     None

## 2022-10-17 ENCOUNTER — Emergency Department (HOSPITAL_COMMUNITY): Payer: Commercial Managed Care - HMO

## 2022-10-17 DIAGNOSIS — E876 Hypokalemia: Secondary | ICD-10-CM

## 2022-10-17 DIAGNOSIS — E871 Hypo-osmolality and hyponatremia: Secondary | ICD-10-CM | POA: Diagnosis present

## 2022-10-17 DIAGNOSIS — Z79891 Long term (current) use of opiate analgesic: Secondary | ICD-10-CM | POA: Diagnosis not present

## 2022-10-17 DIAGNOSIS — R1110 Vomiting, unspecified: Secondary | ICD-10-CM

## 2022-10-17 DIAGNOSIS — Z515 Encounter for palliative care: Secondary | ICD-10-CM | POA: Diagnosis not present

## 2022-10-17 DIAGNOSIS — R531 Weakness: Secondary | ICD-10-CM | POA: Diagnosis not present

## 2022-10-17 DIAGNOSIS — R112 Nausea with vomiting, unspecified: Secondary | ICD-10-CM | POA: Diagnosis present

## 2022-10-17 DIAGNOSIS — F32A Depression, unspecified: Secondary | ICD-10-CM | POA: Diagnosis present

## 2022-10-17 DIAGNOSIS — Z681 Body mass index (BMI) 19 or less, adult: Secondary | ICD-10-CM | POA: Diagnosis not present

## 2022-10-17 DIAGNOSIS — Z7982 Long term (current) use of aspirin: Secondary | ICD-10-CM | POA: Diagnosis not present

## 2022-10-17 DIAGNOSIS — Z885 Allergy status to narcotic agent status: Secondary | ICD-10-CM | POA: Diagnosis not present

## 2022-10-17 DIAGNOSIS — Z8616 Personal history of COVID-19: Secondary | ICD-10-CM | POA: Diagnosis not present

## 2022-10-17 DIAGNOSIS — Z79899 Other long term (current) drug therapy: Secondary | ICD-10-CM | POA: Diagnosis not present

## 2022-10-17 DIAGNOSIS — F419 Anxiety disorder, unspecified: Secondary | ICD-10-CM | POA: Diagnosis present

## 2022-10-17 DIAGNOSIS — G893 Neoplasm related pain (acute) (chronic): Secondary | ICD-10-CM | POA: Diagnosis present

## 2022-10-17 DIAGNOSIS — Z90710 Acquired absence of both cervix and uterus: Secondary | ICD-10-CM | POA: Diagnosis not present

## 2022-10-17 DIAGNOSIS — C3490 Malignant neoplasm of unspecified part of unspecified bronchus or lung: Secondary | ICD-10-CM | POA: Diagnosis present

## 2022-10-17 DIAGNOSIS — R636 Underweight: Secondary | ICD-10-CM | POA: Diagnosis present

## 2022-10-17 DIAGNOSIS — E781 Pure hyperglyceridemia: Secondary | ICD-10-CM | POA: Diagnosis present

## 2022-10-17 DIAGNOSIS — M546 Pain in thoracic spine: Secondary | ICD-10-CM | POA: Diagnosis present

## 2022-10-17 DIAGNOSIS — D638 Anemia in other chronic diseases classified elsewhere: Secondary | ICD-10-CM | POA: Diagnosis present

## 2022-10-17 DIAGNOSIS — G894 Chronic pain syndrome: Secondary | ICD-10-CM | POA: Diagnosis present

## 2022-10-17 DIAGNOSIS — I10 Essential (primary) hypertension: Secondary | ICD-10-CM | POA: Diagnosis present

## 2022-10-17 DIAGNOSIS — E8721 Acute metabolic acidosis: Secondary | ICD-10-CM | POA: Diagnosis present

## 2022-10-17 DIAGNOSIS — K5903 Drug induced constipation: Secondary | ICD-10-CM | POA: Diagnosis present

## 2022-10-17 DIAGNOSIS — K589 Irritable bowel syndrome without diarrhea: Secondary | ICD-10-CM | POA: Diagnosis present

## 2022-10-17 DIAGNOSIS — Z8249 Family history of ischemic heart disease and other diseases of the circulatory system: Secondary | ICD-10-CM | POA: Diagnosis not present

## 2022-10-17 DIAGNOSIS — Z7189 Other specified counseling: Secondary | ICD-10-CM | POA: Diagnosis not present

## 2022-10-17 HISTORY — DX: Vomiting, unspecified: R11.10

## 2022-10-17 LAB — COMPREHENSIVE METABOLIC PANEL
ALT: 33 U/L (ref 0–44)
AST: 35 U/L (ref 15–41)
Albumin: 3.8 g/dL (ref 3.5–5.0)
Alkaline Phosphatase: 72 U/L (ref 38–126)
Anion gap: 13 (ref 5–15)
BUN: 13 mg/dL (ref 6–20)
CO2: 16 mmol/L — ABNORMAL LOW (ref 22–32)
Calcium: 8.5 mg/dL — ABNORMAL LOW (ref 8.9–10.3)
Chloride: 108 mmol/L (ref 98–111)
Creatinine, Ser: 0.7 mg/dL (ref 0.44–1.00)
GFR, Estimated: >60 mL/min (ref >60)
Glucose, Bld: 119 mg/dL — ABNORMAL HIGH (ref 70–99)
Potassium: 3.3 mmol/L — ABNORMAL LOW (ref 3.5–5.1)
Sodium: 137 mmol/L (ref 135–145)
Total Bilirubin: 0.8 mg/dL (ref 0.3–1.2)
Total Protein: 7 g/dL (ref 6.5–8.1)

## 2022-10-17 LAB — URINALYSIS, ROUTINE W REFLEX MICROSCOPIC
Bilirubin Urine: NEGATIVE
Glucose, UA: NEGATIVE mg/dL
Hgb urine dipstick: NEGATIVE
Ketones, ur: 5 mg/dL — AB
Leukocytes,Ua: NEGATIVE
Nitrite: NEGATIVE
Protein, ur: NEGATIVE mg/dL
Specific Gravity, Urine: <1.005 — ABNORMAL LOW (ref 1.005–1.030)
pH: 6 (ref 5.0–8.0)

## 2022-10-17 LAB — CBC
HCT: 33.4 % — ABNORMAL LOW (ref 36.0–46.0)
Hemoglobin: 11 g/dL — ABNORMAL LOW (ref 12.0–15.0)
MCH: 31.7 pg (ref 26.0–34.0)
MCHC: 32.9 g/dL (ref 30.0–36.0)
MCV: 96.3 fL (ref 80.0–100.0)
Platelets: 346 10*3/uL (ref 150–400)
RBC: 3.47 MIL/uL — ABNORMAL LOW (ref 3.87–5.11)
RDW: 14.7 % (ref 11.5–15.5)
WBC: 5.5 10*3/uL (ref 4.0–10.5)
nRBC: 0 % (ref 0.0–0.2)

## 2022-10-17 LAB — BASIC METABOLIC PANEL
Anion gap: 13 (ref 5–15)
BUN: 12 mg/dL (ref 6–20)
CO2: 18 mmol/L — ABNORMAL LOW (ref 22–32)
Calcium: 8.6 mg/dL — ABNORMAL LOW (ref 8.9–10.3)
Chloride: 107 mmol/L (ref 98–111)
Creatinine, Ser: 0.59 mg/dL (ref 0.44–1.00)
GFR, Estimated: >60 mL/min (ref >60)
Glucose, Bld: 115 mg/dL — ABNORMAL HIGH (ref 70–99)
Potassium: 3.7 mmol/L (ref 3.5–5.1)
Sodium: 138 mmol/L (ref 135–145)

## 2022-10-17 LAB — LIPASE, BLOOD: Lipase: 34 U/L (ref 11–51)

## 2022-10-17 MED ORDER — SENNA 8.6 MG PO TABS
1.0000 | ORAL_TABLET | Freq: Every day | ORAL | Status: DC
  Administered 2022-10-17: 8.6 mg via ORAL
  Filled 2022-10-17: qty 1

## 2022-10-17 MED ORDER — CHLORHEXIDINE GLUCONATE CLOTH 2 % EX PADS
6.0000 | MEDICATED_PAD | Freq: Every day | CUTANEOUS | Status: DC
  Administered 2022-10-17 – 2022-10-23 (×7): 6 via TOPICAL

## 2022-10-17 MED ORDER — ENOXAPARIN SODIUM 40 MG/0.4ML IJ SOSY
40.0000 mg | PREFILLED_SYRINGE | INTRAMUSCULAR | Status: DC
  Administered 2022-10-17 – 2022-10-22 (×6): 40 mg via SUBCUTANEOUS
  Filled 2022-10-17 (×6): qty 0.4

## 2022-10-17 MED ORDER — POLYETHYLENE GLYCOL 3350 17 G PO PACK: 17.0000 g | PACK | Freq: Every day | ORAL | Status: DC | PRN

## 2022-10-17 MED ORDER — ONDANSETRON HCL 4 MG PO TABS: 4.0000 mg | ORAL_TABLET | Freq: Four times a day (QID) | ORAL | Status: DC | PRN

## 2022-10-17 MED ORDER — MAGNESIUM CITRATE PO SOLN
1.0000 | Freq: Every day | ORAL | Status: DC | PRN
  Administered 2022-10-17: 1 via ORAL
  Filled 2022-10-17: qty 296

## 2022-10-17 MED ORDER — SERTRALINE HCL 50 MG PO TABS
100.0000 mg | ORAL_TABLET | Freq: Two times a day (BID) | ORAL | Status: DC
  Administered 2022-10-17 – 2022-10-18 (×2): 100 mg via ORAL
  Filled 2022-10-17 (×2): qty 2

## 2022-10-17 MED ORDER — POLYETHYLENE GLYCOL 3350 17 G PO PACK
17.0000 g | PACK | Freq: Two times a day (BID) | ORAL | Status: DC
  Filled 2022-10-17: qty 1

## 2022-10-17 MED ORDER — SODIUM CHLORIDE (PF) 0.9 % IJ SOLN
INTRAMUSCULAR | Status: AC
  Filled 2022-10-17: qty 100

## 2022-10-17 MED ORDER — FENTANYL 25 MCG/HR TD PT72
1.0000 | MEDICATED_PATCH | TRANSDERMAL | Status: DC
  Administered 2022-10-17 – 2022-10-20 (×2): 1 via TRANSDERMAL
  Filled 2022-10-17 (×2): qty 1

## 2022-10-17 MED ORDER — ONDANSETRON HCL 4 MG/2ML IJ SOLN
4.0000 mg | Freq: Once | INTRAMUSCULAR | Status: AC
  Administered 2022-10-17: 4 mg via INTRAVENOUS
  Filled 2022-10-17: qty 2

## 2022-10-17 MED ORDER — PANTOPRAZOLE SODIUM 40 MG PO TBEC
40.0000 mg | DELAYED_RELEASE_TABLET | Freq: Two times a day (BID) | ORAL | Status: DC
  Administered 2022-10-17 – 2022-10-18 (×2): 40 mg via ORAL
  Filled 2022-10-17 (×2): qty 1

## 2022-10-17 MED ORDER — METOCLOPRAMIDE HCL 5 MG/ML IJ SOLN
10.0000 mg | Freq: Once | INTRAMUSCULAR | Status: AC
  Administered 2022-10-17: 10 mg via INTRAVENOUS
  Filled 2022-10-17: qty 2

## 2022-10-17 MED ORDER — CLONAZEPAM 0.5 MG PO TABS
1.0000 mg | ORAL_TABLET | Freq: Two times a day (BID) | ORAL | Status: DC
  Administered 2022-10-17 – 2022-10-18 (×3): 1 mg via ORAL
  Filled 2022-10-17 (×3): qty 2

## 2022-10-17 MED ORDER — IOHEXOL 300 MG/ML  SOLN
75.0000 mL | Freq: Once | INTRAMUSCULAR | Status: AC | PRN
  Administered 2022-10-17: 75 mL via INTRAVENOUS

## 2022-10-17 MED ORDER — POTASSIUM CHLORIDE CRYS ER 20 MEQ PO TBCR
40.0000 meq | EXTENDED_RELEASE_TABLET | Freq: Once | ORAL | Status: AC
  Administered 2022-10-17: 40 meq via ORAL
  Filled 2022-10-17: qty 2

## 2022-10-17 MED ORDER — DEXAMETHASONE 0.5 MG PO TABS
0.5000 mg | ORAL_TABLET | Freq: Every day | ORAL | Status: DC
  Filled 2022-10-17: qty 1

## 2022-10-17 MED ORDER — TRAZODONE HCL 50 MG PO TABS
150.0000 mg | ORAL_TABLET | Freq: Every evening | ORAL | Status: DC | PRN
  Administered 2022-10-17 – 2022-10-22 (×3): 150 mg via ORAL
  Filled 2022-10-17 (×3): qty 3

## 2022-10-17 MED ORDER — TRAMETINIB DIMETHYL SULFOXIDE 2 MG PO TABS: 2.0000 mg | ORAL_TABLET | Freq: Every day | ORAL | Status: DC

## 2022-10-17 MED ORDER — METOCLOPRAMIDE HCL 5 MG/ML IJ SOLN
5.0000 mg | Freq: Four times a day (QID) | INTRAMUSCULAR | Status: DC | PRN
  Administered 2022-10-18: 5 mg via INTRAVENOUS
  Filled 2022-10-17: qty 2

## 2022-10-17 MED ORDER — POTASSIUM CHLORIDE IN NACL 20-0.9 MEQ/L-% IV SOLN
INTRAVENOUS | Status: DC
  Filled 2022-10-17 (×13): qty 1000

## 2022-10-17 MED ORDER — ONDANSETRON HCL 4 MG/2ML IJ SOLN
4.0000 mg | Freq: Four times a day (QID) | INTRAMUSCULAR | Status: DC | PRN
  Administered 2022-10-17 – 2022-10-18 (×4): 4 mg via INTRAVENOUS
  Filled 2022-10-17 (×4): qty 2

## 2022-10-17 MED ORDER — ASPIRIN 81 MG PO TBEC
81.0000 mg | DELAYED_RELEASE_TABLET | Freq: Every day | ORAL | Status: DC
  Administered 2022-10-18: 81 mg via ORAL
  Filled 2022-10-17: qty 1

## 2022-10-17 MED ORDER — ALBUTEROL SULFATE (2.5 MG/3ML) 0.083% IN NEBU: 2.5000 mg | INHALATION_SOLUTION | RESPIRATORY_TRACT | Status: DC | PRN

## 2022-10-17 MED ORDER — FOLIC ACID 1 MG PO TABS
1.0000 mg | ORAL_TABLET | Freq: Every day | ORAL | Status: DC
  Administered 2022-10-18: 1 mg via ORAL
  Filled 2022-10-17: qty 1

## 2022-10-17 MED ORDER — DABRAFENIB MESYLATE 75 MG PO CAPS: 150.0000 mg | ORAL_CAPSULE | Freq: Two times a day (BID) | ORAL | Status: DC
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
## 2022-10-18 ENCOUNTER — Other Ambulatory Visit: Payer: Self-pay

## 2022-10-18 DIAGNOSIS — C3490 Malignant neoplasm of unspecified part of unspecified bronchus or lung: Secondary | ICD-10-CM

## 2022-10-18 DIAGNOSIS — K5903 Drug induced constipation: Principal | ICD-10-CM

## 2022-10-18 DIAGNOSIS — K5900 Constipation, unspecified: Secondary | ICD-10-CM | POA: Insufficient documentation

## 2022-10-18 DIAGNOSIS — G894 Chronic pain syndrome: Secondary | ICD-10-CM | POA: Diagnosis not present

## 2022-10-18 DIAGNOSIS — R112 Nausea with vomiting, unspecified: Secondary | ICD-10-CM | POA: Diagnosis not present

## 2022-10-18 LAB — BASIC METABOLIC PANEL
Anion gap: 7 (ref 5–15)
BUN: 7 mg/dL (ref 6–20)
CO2: 21 mmol/L — ABNORMAL LOW (ref 22–32)
Calcium: 8.2 mg/dL — ABNORMAL LOW (ref 8.9–10.3)
Chloride: 109 mmol/L (ref 98–111)
Creatinine, Ser: 0.54 mg/dL (ref 0.44–1.00)
GFR, Estimated: >60 mL/min (ref >60)
Glucose, Bld: 122 mg/dL — ABNORMAL HIGH (ref 70–99)
Potassium: 3.8 mmol/L (ref 3.5–5.1)
Sodium: 137 mmol/L (ref 135–145)

## 2022-10-18 LAB — CBC
HCT: 30.1 % — ABNORMAL LOW (ref 36.0–46.0)
Hemoglobin: 10 g/dL — ABNORMAL LOW (ref 12.0–15.0)
MCH: 31.6 pg (ref 26.0–34.0)
MCHC: 33.2 g/dL (ref 30.0–36.0)
MCV: 95.3 fL (ref 80.0–100.0)
Platelets: 307 10*3/uL (ref 150–400)
RBC: 3.16 MIL/uL — ABNORMAL LOW (ref 3.87–5.11)
RDW: 14.5 % (ref 11.5–15.5)
WBC: 6.5 10*3/uL (ref 4.0–10.5)
nRBC: 0 % (ref 0.0–0.2)

## 2022-10-18 MED ORDER — LORAZEPAM 2 MG/ML PO CONC
1.0000 mg | Freq: Two times a day (BID) | ORAL | Status: DC
  Administered 2022-10-18 – 2022-10-19 (×3): 1 mg via SUBLINGUAL
  Filled 2022-10-18 (×3): qty 1

## 2022-10-18 MED ORDER — METOCLOPRAMIDE HCL 5 MG/ML IJ SOLN
5.0000 mg | Freq: Three times a day (TID) | INTRAMUSCULAR | Status: DC
  Administered 2022-10-18 – 2022-10-20 (×6): 5 mg via INTRAVENOUS
  Filled 2022-10-18 (×6): qty 2

## 2022-10-18 MED ORDER — DEXAMETHASONE SODIUM PHOSPHATE 4 MG/ML IJ SOLN
1.0000 mg | Freq: Every day | INTRAMUSCULAR | Status: DC
  Administered 2022-10-18 – 2022-10-23 (×6): 1 mg via INTRAVENOUS
  Filled 2022-10-18 (×6): qty 1

## 2022-10-18 MED ORDER — PROCHLORPERAZINE EDISYLATE 10 MG/2ML IJ SOLN
10.0000 mg | Freq: Four times a day (QID) | INTRAMUSCULAR | Status: DC | PRN
  Administered 2022-10-18 – 2022-10-19 (×2): 10 mg via INTRAVENOUS
  Filled 2022-10-18 (×2): qty 2

## 2022-10-18 MED ORDER — BISACODYL 10 MG RE SUPP
10.0000 mg | Freq: Every day | RECTAL | Status: DC | PRN
  Administered 2022-10-18 – 2022-10-22 (×3): 10 mg via RECTAL
  Filled 2022-10-18 (×3): qty 1

## 2022-10-18 MED ORDER — PANTOPRAZOLE SODIUM 40 MG IV SOLR
40.0000 mg | Freq: Two times a day (BID) | INTRAVENOUS | Status: DC
  Administered 2022-10-18 – 2022-10-23 (×10): 40 mg via INTRAVENOUS
  Filled 2022-10-18 (×10): qty 10

## 2022-10-18 NOTE — Progress Notes (Signed)
       Overnight   NAME: Karen [name] MRN: [id number] DOB : 1965/10/12    Date of Service   10/18/2022   HPI/Events of Note   Notified by RN for patient request to try some degree of advancement in diet    Interventions/ Plan   Advance as tolerated to clear liquid  Advance very slowly  Guard against N/V with small volume attempts  Antiemetics as needed or in advance       Chinita Greenland BSN MSNA MSN ACNPC-AG Acute Care Nurse Practitioner [facility]

## 2022-10-18 NOTE — Progress Notes (Signed)
  Progress Note   Patient: Karen [name] ZOX:[id number] DOB: [birth year] DOA: 10/16/2022     1 DOS: the patient was seen and examined on 10/18/2022   Brief hospital course: 57 year old woman PMH metastatic lung adenocarcinoma followed by Dr. Radene Knee, status post first round of chemotherapy February 2024, presented with intractable nausea and vomiting.  Imaging was without acute findings.  Assessment and Plan: Intractable nausea and vomiting related to chemotherapy Remains nauseous and has vomiting. Adjust antiemetics. Change medications to IV or sublingual.  Metastatic lung adenocarcinoma Cancer related chest pain, mid back from T9 lesion Continue fentanyl patch Follow-up with Dr. Candise Che  Constipation Secondary to chronic narcotics.  To be contributing to nausea and vomiting.  Bowel regimen. Imaging showed dilated gallbladder, no features to suggest acute cholecystitis.  Anxiety BID Klonopin > SL Ativan while vomiting.  IV Ativan is in very short supply.  Severe protein calorie malnutrition with weight loss of 20 to 30 pounds in the last 3 months  Follow-up as an outpatient.  PMH lacunar infarct with right upper extremity paresthesias and numbness, resolved.  Resume aspirin when able to tolerate p.o.     Subjective:  Ongoing nausea and vomiting Minimal lower abdominal pain  Physical Exam: Vitals:   10/17/22 1714 10/17/22 2114 10/18/22 0548 10/18/22 1314  BP: (!) 157/98 (!) 150/101 126/80 (!) 130/90  Pulse: 93 97 82 85  Resp: 15 18 18 15   Temp: 98.5 F (36.9 C) 98.2 F (36.8 C) 97.9 F (36.6 C) 98.2 F (36.8 C)  TempSrc: Oral Oral Oral Oral  SpO2: 100% 100% 100% 100%  Weight:      Height:       Physical Exam Vitals reviewed.  Constitutional:      General: She is not in acute distress.    Appearance: She is not ill-appearing or toxic-appearing.  Cardiovascular:     Rate and Rhythm: Normal rate and regular rhythm.     Heart sounds: No murmur  heard. Pulmonary:     Effort: Pulmonary effort is normal. No respiratory distress.     Breath sounds: No wheezing, rhonchi or rales.  Abdominal:     General: There is no distension.     Palpations: Abdomen is soft.     Tenderness: There is abdominal tenderness (minimal lower abd pain over bladder).  Neurological:     Mental Status: She is alert.  Psychiatric:        Mood and Affect: Mood normal.        Behavior: Behavior normal.     Data Reviewed: BMP noted Hgb stable 10.0. WBC WNL. Gallbladder u/s: Dilated gallbladder with small amount of sludge but no shadowing stones or wall thickening. Negative sonographic Murphy. No biliary dilatation CT 1. Distended gallbladder with possible mild wall thickening, this may be correlated with ultrasound as indicated 2. Large stool burden suggesting constipation 3. Slight interval progression of skeletal metastatic disease.  Family Communication: none  Disposition: Status is: Inpatient Remains inpatient appropriate because: intractable n/v  Planned Discharge Destination: Home    Time spent: 35 minutes  Author: Brendia Sacks, MD 10/18/2022 3:51 PM  For on call review [email or web address].

## 2022-10-18 NOTE — Hospital Course (Addendum)
57 year old woman PMH metastatic lung adenocarcinoma followed by Dr. Candise Che, status post first round of chemotherapy February 2024, presented with intractable nausea and vomiting.  Imaging was without acute findings.

## 2022-10-19 ENCOUNTER — Other Ambulatory Visit (HOSPITAL_COMMUNITY): Payer: Self-pay

## 2022-10-19 ENCOUNTER — Inpatient Hospital Stay: Payer: Commercial Managed Care - HMO | Admitting: Nurse Practitioner

## 2022-10-19 DIAGNOSIS — K5903 Drug induced constipation: Secondary | ICD-10-CM | POA: Diagnosis not present

## 2022-10-19 DIAGNOSIS — C3490 Malignant neoplasm of unspecified part of unspecified bronchus or lung: Secondary | ICD-10-CM | POA: Diagnosis not present

## 2022-10-19 DIAGNOSIS — R112 Nausea with vomiting, unspecified: Secondary | ICD-10-CM | POA: Diagnosis not present

## 2022-10-19 LAB — CBC
HCT: 31.8 % — ABNORMAL LOW (ref 36.0–46.0)
Hemoglobin: 10.4 g/dL — ABNORMAL LOW (ref 12.0–15.0)
MCH: 31.4 pg (ref 26.0–34.0)
MCHC: 32.7 g/dL (ref 30.0–36.0)
MCV: 96.1 fL (ref 80.0–100.0)
Platelets: 275 10*3/uL (ref 150–400)
RBC: 3.31 MIL/uL — ABNORMAL LOW (ref 3.87–5.11)
RDW: 14.4 % (ref 11.5–15.5)
WBC: 6.6 10*3/uL (ref 4.0–10.5)
nRBC: 0 % (ref 0.0–0.2)

## 2022-10-19 LAB — BASIC METABOLIC PANEL
Anion gap: 6 (ref 5–15)
BUN: 5 mg/dL — ABNORMAL LOW (ref 6–20)
CO2: 20 mmol/L — ABNORMAL LOW (ref 22–32)
Calcium: 7.6 mg/dL — ABNORMAL LOW (ref 8.9–10.3)
Chloride: 106 mmol/L (ref 98–111)
Creatinine, Ser: 0.52 mg/dL (ref 0.44–1.00)
GFR, Estimated: >60 mL/min (ref >60)
Glucose, Bld: 88 mg/dL (ref 70–99)
Potassium: 3.7 mmol/L (ref 3.5–5.1)
Sodium: 132 mmol/L — ABNORMAL LOW (ref 135–145)

## 2022-10-19 MED ORDER — ONDANSETRON HCL 4 MG/2ML IJ SOLN
4.0000 mg | Freq: Once | INTRAMUSCULAR | Status: AC | PRN
  Administered 2022-10-19: 4 mg via INTRAVENOUS
  Filled 2022-10-19: qty 2

## 2022-10-19 MED ORDER — IBUPROFEN 200 MG PO TABS
400.0000 mg | ORAL_TABLET | Freq: Four times a day (QID) | ORAL | Status: DC | PRN
  Administered 2022-10-19 – 2022-10-21 (×2): 400 mg via ORAL
  Filled 2022-10-19 (×2): qty 2

## 2022-10-19 MED ORDER — PROCHLORPERAZINE EDISYLATE 10 MG/2ML IJ SOLN
10.0000 mg | Freq: Four times a day (QID) | INTRAMUSCULAR | Status: DC
  Administered 2022-10-19 – 2022-10-23 (×14): 10 mg via INTRAVENOUS
  Filled 2022-10-19 (×15): qty 2

## 2022-10-19 MED ORDER — CLONAZEPAM 1 MG PO TABS
1.0000 mg | ORAL_TABLET | Freq: Two times a day (BID) | ORAL | Status: DC
  Administered 2022-10-19 – 2022-10-23 (×9): 1 mg via ORAL
  Filled 2022-10-19 (×9): qty 1

## 2022-10-19 NOTE — Progress Notes (Signed)
  Progress Note   Patient: Karen [name] ZOX:[id number] DOB: 07/15/1965 DOA: 10/16/2022     2 DOS: the patient was seen and examined on 10/19/2022   Brief hospital course: 57 year old woman PMH metastatic lung adenocarcinoma followed by Dr. Candise Che, status post first round of chemotherapy February 2024, presented with intractable nausea and vomiting.  Imaging was without acute findings.  Assessment and Plan: Intractable nausea and vomiting related to chemotherapy Remains nauseous, will keep on clear liquids.  Requesting Klonopin (chronic home use) Schedule antiemetic.   Metastatic lung adenocarcinoma Cancer related chest pain, mid back from T9 lesion Continue fentanyl patch Follow-up with Dr. Candise Che   Constipation Secondary to chronic narcotics.  To be contributing to nausea and vomiting.  Bowels moving.  Continue bowel regimen. Imaging showed dilated gallbladder, no features to suggest acute cholecystitis.   Anxiety Resume twice daily Klonopin per patient request.   Severe protein calorie malnutrition with weight loss of 20 to 30 pounds in the last 3 months  Follow-up as an outpatient.   PMH lacunar infarct with right upper extremity paresthesias and numbness, resolved.  Resume aspirin when able to tolerate p.o.  Home when nausea resolved and tolerating diet.     Subjective:   Requested advancement in diet overnight > clear liquids Still has nausea, small amount of vomiting today Some generalized abdominal pain  Physical Exam: Vitals:   10/18/22 1314 10/18/22 2205 10/19/22 0609 10/19/22 1344  BP: (!) 130/90 (!) 140/94 (!) 134/92 138/84  Pulse: 85 88 81 88  Resp: 15 18 18 18   Temp: 98.2 F (36.8 C) 97.7 F (36.5 C) 98.1 F (36.7 C) 97.8 F (36.6 C)  TempSrc: Oral Oral Oral Oral  SpO2: 100% 99% 100% 99%  Weight:      Height:       Physical Exam Vitals reviewed.  Constitutional:      General: She is not in acute distress.    Appearance: She is not  ill-appearing or toxic-appearing.  Cardiovascular:     Rate and Rhythm: Normal rate and regular rhythm.     Heart sounds: No murmur heard. Pulmonary:     Effort: Pulmonary effort is normal. No respiratory distress.     Breath sounds: No wheezing, rhonchi or rales.  Abdominal:     General: There is no distension.     Palpations: Abdomen is soft.     Tenderness: There is abdominal tenderness (Mild generalized tenderness, minimal to no RUQ tenderness).  Neurological:     Mental Status: She is alert.  Psychiatric:        Mood and Affect: Mood normal.        Behavior: Behavior normal.     Data Reviewed: Na+ 132 Hgb stable 10.4  Family Communication: none  Disposition: Status is: Inpatient Remains inpatient appropriate because: poor oral intake, persistent nausea  Planned Discharge Destination: Home    Time spent: 35 minutes  Author: Brendia Sacks, MD 10/19/2022 2:24 PM  For on call review [email or web address].

## 2022-10-20 ENCOUNTER — Inpatient Hospital Stay (HOSPITAL_COMMUNITY): Payer: Commercial Managed Care - HMO

## 2022-10-20 DIAGNOSIS — R112 Nausea with vomiting, unspecified: Secondary | ICD-10-CM | POA: Diagnosis not present

## 2022-10-20 DIAGNOSIS — C3490 Malignant neoplasm of unspecified part of unspecified bronchus or lung: Secondary | ICD-10-CM | POA: Diagnosis not present

## 2022-10-20 DIAGNOSIS — G894 Chronic pain syndrome: Secondary | ICD-10-CM | POA: Diagnosis not present

## 2022-10-20 LAB — BASIC METABOLIC PANEL
Anion gap: 10 (ref 5–15)
BUN: <5 mg/dL — ABNORMAL LOW (ref 6–20)
CO2: 19 mmol/L — ABNORMAL LOW (ref 22–32)
Calcium: 8.1 mg/dL — ABNORMAL LOW (ref 8.9–10.3)
Chloride: 100 mmol/L (ref 98–111)
Creatinine, Ser: 0.52 mg/dL (ref 0.44–1.00)
GFR, Estimated: >60 mL/min (ref >60)
Glucose, Bld: 106 mg/dL — ABNORMAL HIGH (ref 70–99)
Potassium: 3.4 mmol/L — ABNORMAL LOW (ref 3.5–5.1)
Sodium: 129 mmol/L — ABNORMAL LOW (ref 135–145)

## 2022-10-20 LAB — CBC
HCT: 30.4 % — ABNORMAL LOW (ref 36.0–46.0)
Hemoglobin: 10.5 g/dL — ABNORMAL LOW (ref 12.0–15.0)
MCH: 31.9 pg (ref 26.0–34.0)
MCHC: 34.5 g/dL (ref 30.0–36.0)
MCV: 92.4 fL (ref 80.0–100.0)
Platelets: 247 10*3/uL (ref 150–400)
RBC: 3.29 MIL/uL — ABNORMAL LOW (ref 3.87–5.11)
RDW: 13.6 % (ref 11.5–15.5)
WBC: 4.7 10*3/uL (ref 4.0–10.5)
nRBC: 0 % (ref 0.0–0.2)

## 2022-10-20 MED ORDER — SENNOSIDES-DOCUSATE SODIUM 8.6-50 MG PO TABS
1.0000 | ORAL_TABLET | Freq: Two times a day (BID) | ORAL | Status: DC
  Administered 2022-10-20 – 2022-10-23 (×7): 1 via ORAL
  Filled 2022-10-20 (×7): qty 1

## 2022-10-20 MED ORDER — FLEET ENEMA 7-19 GM/118ML RE ENEM
1.0000 | ENEMA | Freq: Once | RECTAL | Status: AC
  Administered 2022-10-20: 1 via RECTAL
  Filled 2022-10-20: qty 1

## 2022-10-20 MED ORDER — HYDROXYZINE HCL 10 MG PO TABS
10.0000 mg | ORAL_TABLET | Freq: Once | ORAL | Status: AC
  Administered 2022-10-20: 10 mg via ORAL
  Filled 2022-10-20 (×2): qty 1

## 2022-10-20 MED ORDER — POLYETHYLENE GLYCOL 3350 17 G PO PACK
17.0000 g | PACK | Freq: Two times a day (BID) | ORAL | Status: DC
  Administered 2022-10-20 – 2022-10-23 (×6): 17 g via ORAL
  Filled 2022-10-20 (×7): qty 1

## 2022-10-20 MED ORDER — METOCLOPRAMIDE HCL 5 MG/ML IJ SOLN
10.0000 mg | Freq: Four times a day (QID) | INTRAMUSCULAR | Status: DC | PRN
  Administered 2022-10-20: 10 mg via INTRAVENOUS
  Filled 2022-10-20: qty 2

## 2022-10-20 NOTE — Progress Notes (Signed)
Pt c/o anxiety and nausea unrelieved with medication regime.  Pt had 2 episodes of active vomiting so far tonight.  Johann Capers NP made aware and orders received, will cont to monitor

## 2022-10-20 NOTE — TOC Progression Note (Signed)
Transition of Care [facility]) - Progression Note    Patient Details  Name: Rainbow Salman MRN: [id number] Date of Birth: [birth year]  Transition of [facility]) CM/SW Contact  Beckie Busing, RN Phone Number:[phone]  10/20/2022, 2:45 PM  Clinical Narrative:     Transition of Care [facility]) Screening Note   Patient Details  Name: Sherina Stammer Date of Birth: [birth year]   Transition of Care [facility]) CM/SW Contact:    Beckie Busing, RN Phone Number: 10/20/2022, 2:45 PM    Transition of Care Department [facility]) has reviewed patient and no TOC needs have been identified at this time. We will continue to monitor patient advancement through interdisciplinary progression rounds. If new patient transition needs arise, please place a TOC consult.          Expected Discharge Plan and Services                                               Social Determinants of Health (SDOH) Interventions SDOH Screenings   Food Insecurity: No Food Insecurity (10/17/2022)  Housing: Low Risk  (10/17/2022)  Transportation Needs: No Transportation Needs (10/17/2022)  Utilities: Not At Risk (10/17/2022)  Depression (PHQ2-9): High Risk (08/10/2022)  Social Connections: Socially Isolated (07/09/2022)  Tobacco Use: Low Risk  (10/16/2022)    Readmission Risk Interventions     No data to display
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
## 2022-10-21 ENCOUNTER — Other Ambulatory Visit (HOSPITAL_COMMUNITY): Payer: Self-pay

## 2022-10-21 DIAGNOSIS — G894 Chronic pain syndrome: Secondary | ICD-10-CM | POA: Diagnosis not present

## 2022-10-21 DIAGNOSIS — C3490 Malignant neoplasm of unspecified part of unspecified bronchus or lung: Secondary | ICD-10-CM | POA: Diagnosis not present

## 2022-10-21 DIAGNOSIS — Z7189 Other specified counseling: Secondary | ICD-10-CM

## 2022-10-21 DIAGNOSIS — R531 Weakness: Secondary | ICD-10-CM

## 2022-10-21 DIAGNOSIS — Z515 Encounter for palliative care: Secondary | ICD-10-CM

## 2022-10-21 DIAGNOSIS — R112 Nausea with vomiting, unspecified: Secondary | ICD-10-CM | POA: Diagnosis not present

## 2022-10-21 LAB — BASIC METABOLIC PANEL
Anion gap: 7 (ref 5–15)
BUN: <5 mg/dL — ABNORMAL LOW (ref 6–20)
CO2: 21 mmol/L — ABNORMAL LOW (ref 22–32)
Calcium: 8.4 mg/dL — ABNORMAL LOW (ref 8.9–10.3)
Chloride: 105 mmol/L (ref 98–111)
Creatinine, Ser: 0.61 mg/dL (ref 0.44–1.00)
GFR, Estimated: >60 mL/min (ref >60)
Glucose, Bld: 124 mg/dL — ABNORMAL HIGH (ref 70–99)
Potassium: 3.5 mmol/L (ref 3.5–5.1)
Sodium: 133 mmol/L — ABNORMAL LOW (ref 135–145)

## 2022-10-21 LAB — CBC
HCT: 32.3 % — ABNORMAL LOW (ref 36.0–46.0)
Hemoglobin: 11.4 g/dL — ABNORMAL LOW (ref 12.0–15.0)
MCH: 33 pg (ref 26.0–34.0)
MCHC: 35.3 g/dL (ref 30.0–36.0)
MCV: 93.6 fL (ref 80.0–100.0)
Platelets: 283 10*3/uL (ref 150–400)
RBC: 3.45 MIL/uL — ABNORMAL LOW (ref 3.87–5.11)
RDW: 14.1 % (ref 11.5–15.5)
WBC: 5.3 10*3/uL (ref 4.0–10.5)
nRBC: 0 % (ref 0.0–0.2)

## 2022-10-21 LAB — MAGNESIUM: Magnesium: 1.9 mg/dL (ref 1.7–2.4)

## 2022-10-21 MED ORDER — MILK AND MOLASSES ENEMA
1.0000 | Freq: Once | RECTAL | Status: AC
  Administered 2022-10-21: 240 mL via RECTAL
  Filled 2022-10-21: qty 240

## 2022-10-21 MED ORDER — ADULT MULTIVITAMIN W/MINERALS CH
1.0000 | ORAL_TABLET | Freq: Every day | ORAL | Status: DC
  Administered 2022-10-21 – 2022-10-23 (×3): 1 via ORAL
  Filled 2022-10-21 (×3): qty 1

## 2022-10-21 MED ORDER — BOOST / RESOURCE BREEZE PO LIQD CUSTOM
1.0000 | Freq: Three times a day (TID) | ORAL | Status: DC
  Administered 2022-10-23: 1 via ORAL
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
## 2022-10-21 NOTE — Progress Notes (Signed)
Mobility Specialist - Progress Note   10/21/22 1457  Mobility  Activity Ambulated independently in hallway;Ambulated independently to bathroom  Level of Assistance Independent  Assistive Device None  Distance Ambulated (ft) 265 ft  Activity Response Tolerated well  Mobility Referral Yes  $Mobility charge 1 Mobility   Pt received in bed and agreeable to mobility. No complaints during session. Pt to bathroom after session with all needs met & nurse in room.  [facility]
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
## 2022-10-22 ENCOUNTER — Other Ambulatory Visit (HOSPITAL_COMMUNITY): Payer: Self-pay

## 2022-10-22 DIAGNOSIS — R112 Nausea with vomiting, unspecified: Secondary | ICD-10-CM | POA: Diagnosis not present

## 2022-10-22 LAB — BASIC METABOLIC PANEL
Anion gap: 7 (ref 5–15)
BUN: <5 mg/dL — ABNORMAL LOW (ref 6–20)
CO2: 23 mmol/L (ref 22–32)
Calcium: 8.3 mg/dL — ABNORMAL LOW (ref 8.9–10.3)
Chloride: 105 mmol/L (ref 98–111)
Creatinine, Ser: 0.62 mg/dL (ref 0.44–1.00)
GFR, Estimated: >60 mL/min (ref >60)
Glucose, Bld: 99 mg/dL (ref 70–99)
Potassium: 3.7 mmol/L (ref 3.5–5.1)
Sodium: 135 mmol/L (ref 135–145)

## 2022-10-22 LAB — CBC
HCT: 31.1 % — ABNORMAL LOW (ref 36.0–46.0)
Hemoglobin: 10.5 g/dL — ABNORMAL LOW (ref 12.0–15.0)
MCH: 31.8 pg (ref 26.0–34.0)
MCHC: 33.8 g/dL (ref 30.0–36.0)
MCV: 94.2 fL (ref 80.0–100.0)
Platelets: 252 10*3/uL (ref 150–400)
RBC: 3.3 MIL/uL — ABNORMAL LOW (ref 3.87–5.11)
RDW: 14.1 % (ref 11.5–15.5)
WBC: 5.2 10*3/uL (ref 4.0–10.5)
nRBC: 0 % (ref 0.0–0.2)

## 2022-10-22 LAB — MAGNESIUM: Magnesium: 1.9 mg/dL (ref 1.7–2.4)
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
## 2022-10-23 DIAGNOSIS — R112 Nausea with vomiting, unspecified: Secondary | ICD-10-CM | POA: Diagnosis not present

## 2022-10-23 DIAGNOSIS — C3490 Malignant neoplasm of unspecified part of unspecified bronchus or lung: Secondary | ICD-10-CM | POA: Diagnosis not present

## 2022-10-23 LAB — BASIC METABOLIC PANEL
Anion gap: 9 (ref 5–15)
BUN: <5 mg/dL — ABNORMAL LOW (ref 6–20)
CO2: 23 mmol/L (ref 22–32)
Calcium: 8.9 mg/dL (ref 8.9–10.3)
Chloride: 101 mmol/L (ref 98–111)
Creatinine, Ser: 0.62 mg/dL (ref 0.44–1.00)
GFR, Estimated: >60 mL/min (ref >60)
Glucose, Bld: 119 mg/dL — ABNORMAL HIGH (ref 70–99)
Potassium: 3.5 mmol/L (ref 3.5–5.1)
Sodium: 133 mmol/L — ABNORMAL LOW (ref 135–145)

## 2022-10-23 LAB — MAGNESIUM: Magnesium: 1.9 mg/dL (ref 1.7–2.4)

## 2022-10-23 MED ORDER — BISACODYL 10 MG RE SUPP: 10.0000 mg | Freq: Every day | RECTAL | 0 refills | Status: DC | PRN

## 2022-10-23 MED ORDER — ONDANSETRON 8 MG PO TBDP
8.0000 mg | ORAL_TABLET | Freq: Three times a day (TID) | ORAL | 0 refills | Status: DC | PRN
Start: 2022-10-23 — End: 2022-12-15

## 2022-10-23 MED ORDER — SENNOSIDES-DOCUSATE SODIUM 8.6-50 MG PO TABS: 1.0000 | ORAL_TABLET | Freq: Two times a day (BID) | ORAL | 0 refills | Status: DC

## 2022-10-23 MED ORDER — METOCLOPRAMIDE HCL 5 MG PO TABS: 5.0000 mg | ORAL_TABLET | Freq: Three times a day (TID) | ORAL | 0 refills | Status: DC

## 2022-10-23 MED ORDER — PROCHLORPERAZINE MALEATE 10 MG PO TABS
10.0000 mg | ORAL_TABLET | Freq: Four times a day (QID) | ORAL | 0 refills | Status: DC | PRN
Start: 2022-10-23 — End: 2022-12-15

## 2022-10-23 MED ORDER — POLYETHYLENE GLYCOL 3350 17 G PO PACK: 17.0000 g | PACK | Freq: Two times a day (BID) | ORAL | 0 refills | Status: DC

## 2022-10-23 MED ORDER — ALBUTEROL SULFATE HFA 108 (90 BASE) MCG/ACT IN AERS: 2.0000 | INHALATION_SPRAY | RESPIRATORY_TRACT | Status: DC

## 2022-10-23 MED ORDER — PANTOPRAZOLE SODIUM 40 MG PO TBEC: 40.0000 mg | DELAYED_RELEASE_TABLET | Freq: Two times a day (BID) | ORAL | 1 refills | Status: DC

## 2022-10-23 MED ORDER — HEPARIN SOD (PORK) LOCK FLUSH 100 UNIT/ML IV SOLN
500.0000 [IU] | Freq: Once | INTRAVENOUS | Status: AC
  Administered 2022-10-23: 500 [IU] via INTRAVENOUS
  Filled 2022-10-23: qty 5
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
## 2022-10-23 NOTE — Progress Notes (Signed)
Karen [name] to be D/C'd Home per MD order.  Discussed with the patient and all questions fully answered.  VSS, Skin clean, dry and intact without evidence of skin break down, no evidence of skin tears noted. Port-a-catheter discontinued intact. Site without signs and symptoms of complications. Dressing and pressure applied.  An After Visit Summary was printed and given to the patient. Patient received prescription.  D/c education completed with patient/family including follow up instructions, medication list, d/c activities limitations if indicated, with other d/c instructions as indicated by MD - patient able to verbalize understanding, all questions fully answered.   Patient instructed to return to ED, call 911, or call MD for any changes in condition.   Patient escorted via WC, and D/C home via private auto.  [name] 10/23/2022 11:36 AM

## 2022-10-25 ENCOUNTER — Telehealth: Payer: Self-pay

## 2022-10-25 NOTE — Transitions of Care (Post Inpatient/ED Visit) (Unsigned)
   10/25/2022  Name: Fierra Cavanna MRN: [id number] DOB: 07-03-1965  Today's TOC FU Call Status: Today's TOC FU Call Status:: Unsuccessul Call (1st Attempt) Unsuccessful Call (1st Attempt) Date: 10/25/22  Attempted to reach the patient regarding the most recent Inpatient/ED visit.  Follow Up Plan: Additional outreach attempts will be made to reach the patient to complete the Transitions of Care (Post Inpatient/ED visit) call.   Signature Karena Addison, LPN [facility] Nurse Health Advisor Direct Dial [phone]

## 2022-10-26 NOTE — Transitions of Care (Post Inpatient/ED Visit) (Unsigned)
   10/26/2022  Name: Karen [name] MRN: [id number] DOB: 1965-12-19  Today's TOC FU Call Status: Today's TOC FU Call Status:: Unsuccessful Call (2nd Attempt) Unsuccessful Call (1st Attempt) Date: 10/25/22 Unsuccessful Call (2nd Attempt) Date: 10/26/22  Attempted to reach the patient regarding the most recent Inpatient/ED visit.  Follow Up Plan: Additional outreach attempts will be made to reach the patient to complete the Transitions of Care (Post Inpatient/ED visit) call.   Signature Karena Addison, LPN [facility] Nurse Health Advisor Direct Dial [phone]

## 2022-10-27 ENCOUNTER — Other Ambulatory Visit: Payer: Self-pay

## 2022-10-27 NOTE — Transitions of Care (Post Inpatient/ED Visit) (Signed)
   10/27/2022  Name: Karen [name] MRN: [id number] DOB: 07-01-1965  Today's TOC FU Call Status: Today's TOC FU Call Status:: Unsuccessful Call (3rd Attempt) Unsuccessful Call (1st Attempt) Date: 10/25/22 Unsuccessful Call (2nd Attempt) Date: 10/26/22 Unsuccessful Call (3rd Attempt) Date: 10/27/22  Attempted to reach the patient regarding the most recent Inpatient/ED visit.  Follow Up Plan: No further outreach attempts will be made at this time. We have been unable to contact the patient.  Signature Karena Addison, LPN [facility] Nurse Health Advisor Direct Dial [phone]

## 2022-10-30 ENCOUNTER — Other Ambulatory Visit: Payer: Self-pay

## 2022-10-30 ENCOUNTER — Encounter (HOSPITAL_COMMUNITY): Payer: Self-pay

## 2022-10-30 ENCOUNTER — Emergency Department (HOSPITAL_COMMUNITY)
Admission: EM | Admit: 2022-10-30 | Discharge: 2022-10-31 | Disposition: A | Discharge status: Home / Self Care | Payer: Commercial Managed Care - HMO | Attending: Emergency Medicine | Admitting: Emergency Medicine

## 2022-10-30 ENCOUNTER — Emergency Department (HOSPITAL_COMMUNITY): Payer: Commercial Managed Care - HMO

## 2022-10-30 DIAGNOSIS — Z7982 Long term (current) use of aspirin: Secondary | ICD-10-CM | POA: Insufficient documentation

## 2022-10-30 DIAGNOSIS — R112 Nausea with vomiting, unspecified: Secondary | ICD-10-CM | POA: Diagnosis present

## 2022-10-30 DIAGNOSIS — T451X5A Adverse effect of antineoplastic and immunosuppressive drugs, initial encounter: Secondary | ICD-10-CM | POA: Diagnosis not present

## 2022-10-30 DIAGNOSIS — K5903 Drug induced constipation: Secondary | ICD-10-CM | POA: Insufficient documentation

## 2022-10-30 DIAGNOSIS — I10 Essential (primary) hypertension: Secondary | ICD-10-CM | POA: Diagnosis not present

## 2022-10-30 DIAGNOSIS — E876 Hypokalemia: Secondary | ICD-10-CM | POA: Insufficient documentation

## 2022-10-30 DIAGNOSIS — Z79899 Other long term (current) drug therapy: Secondary | ICD-10-CM | POA: Insufficient documentation

## 2022-10-30 HISTORY — DX: Malignant neoplasm of unspecified part of unspecified bronchus or lung: C34.90

## 2022-10-30 LAB — COMPREHENSIVE METABOLIC PANEL
ALT: 17 U/L (ref 0–44)
AST: 18 U/L (ref 15–41)
Albumin: 4.4 g/dL (ref 3.5–5.0)
Alkaline Phosphatase: 54 U/L (ref 38–126)
Anion gap: 14 (ref 5–15)
BUN: 12 mg/dL (ref 6–20)
CO2: 20 mmol/L — ABNORMAL LOW (ref 22–32)
Calcium: 9 mg/dL (ref 8.9–10.3)
Chloride: 98 mmol/L (ref 98–111)
Creatinine, Ser: 0.82 mg/dL (ref 0.44–1.00)
GFR, Estimated: >60 mL/min (ref >60)
Glucose, Bld: 117 mg/dL — ABNORMAL HIGH (ref 70–99)
Potassium: 2.7 mmol/L — CL (ref 3.5–5.1)
Sodium: 132 mmol/L — ABNORMAL LOW (ref 135–145)
Total Bilirubin: 0.9 mg/dL (ref 0.3–1.2)
Total Protein: 7.3 g/dL (ref 6.5–8.1)

## 2022-10-30 LAB — URINALYSIS, ROUTINE W REFLEX MICROSCOPIC
Bacteria, UA: NONE SEEN
Bilirubin Urine: NEGATIVE
Glucose, UA: NEGATIVE mg/dL
Hgb urine dipstick: NEGATIVE
Ketones, ur: 5 mg/dL — AB
Leukocytes,Ua: NEGATIVE
Nitrite: NEGATIVE
Protein, ur: 30 mg/dL — AB
Specific Gravity, Urine: 1.023 (ref 1.005–1.030)
pH: 5 (ref 5.0–8.0)

## 2022-10-30 LAB — CBC
HCT: 36.7 % (ref 36.0–46.0)
Hemoglobin: 12.8 g/dL (ref 12.0–15.0)
MCH: 32.2 pg (ref 26.0–34.0)
MCHC: 34.9 g/dL (ref 30.0–36.0)
MCV: 92.4 fL (ref 80.0–100.0)
Platelets: 340 10*3/uL (ref 150–400)
RBC: 3.97 MIL/uL (ref 3.87–5.11)
RDW: 12.9 % (ref 11.5–15.5)
WBC: 6.6 10*3/uL (ref 4.0–10.5)
nRBC: 0 % (ref 0.0–0.2)

## 2022-10-30 LAB — LIPASE, BLOOD: Lipase: 47 U/L (ref 11–51)

## 2022-10-30 MED ORDER — SODIUM CHLORIDE 0.9 % IV BOLUS
1000.0000 mL | Freq: Once | INTRAVENOUS | Status: AC
  Administered 2022-10-30: 1000 mL via INTRAVENOUS

## 2022-10-30 MED ORDER — POTASSIUM CHLORIDE CRYS ER 20 MEQ PO TBCR
80.0000 meq | EXTENDED_RELEASE_TABLET | Freq: Once | ORAL | Status: DC
  Filled 2022-10-30: qty 4

## 2022-10-30 MED ORDER — ONDANSETRON HCL 4 MG/2ML IJ SOLN
4.0000 mg | Freq: Once | INTRAMUSCULAR | Status: AC
  Administered 2022-10-30: 4 mg via INTRAVENOUS
  Filled 2022-10-30: qty 2

## 2022-10-30 MED ORDER — POTASSIUM CHLORIDE 10 MEQ/100ML IV SOLN
10.0000 meq | Freq: Once | INTRAVENOUS | Status: AC
  Administered 2022-10-30: 10 meq via INTRAVENOUS

## 2022-10-30 MED ORDER — POTASSIUM CHLORIDE CRYS ER 20 MEQ PO TBCR
20.0000 meq | EXTENDED_RELEASE_TABLET | Freq: Once | ORAL | Status: AC
  Administered 2022-10-30: 20 meq via ORAL

## 2022-10-30 MED ORDER — POTASSIUM CHLORIDE 10 MEQ/100ML IV SOLN
10.0000 meq | INTRAVENOUS | Status: AC
  Administered 2022-10-30 – 2022-10-31 (×3): 10 meq via INTRAVENOUS
  Filled 2022-10-30 (×2): qty 100

## 2022-10-30 NOTE — ED Provider Notes (Signed)
[place] [facility]ENC[facility] DEPARTMENT AT [facility] Provider Note   CSN: [id number] Arrival date & time: 10/30/22  2202     History  Chief Complaint  Patient presents with   Emesis    Karen [name] is a 57 y.o. female.  HPI   Patient with medical history including GERD, hypertension,  recently diagnosed with metastatic lung cancer currently on chemo, malignant right pleural effusion, chronic abdominal pain, hypertension, IBS, presenting with complaints of nausea vomiting stomach pain been going on for the last 6 days, states that she has been taking Zofran without much relief.  Pain is constant, epigastric region does not radiate, does not endorse bloody emesis or coffee-ground emesis, states that she has not had a bowel movement over a week, she denies any dysuria or hematuria.  States that this feels like when she was admitted.  She is not endorsing this like chest pain, shortness of breath or chest pain, states she has had in the past.  No fevers chills cough congestion.  Reviewed patient's charts diagnosed with metastatic lung cancer back in January, undergoing currently on immuno therapy,Has been admitted 3 times for uncontrolled nausea and vomiting, was most recently admitted on 04/27 and discharge on 05/04 for intractable nausea and vomiting, patient was given scheduled Reglan and Zofran improved without any other intervention, CT imaging was negative, she was found to be constipated and was given bowel prep.  COVID is patient is here for nausea and vomiting is likely multifactorial constipation, narcotic use as well as current chemotherapy treatments.  Home Medications Prior to Admission medications   Medication Sig Start Date End Date Taking? Authorizing Provider  ondansetron (ZOFRAN) 4 MG tablet Take 1 tablet (4 mg total) by mouth every 6 (six) hours. 10/31/22  Yes Carroll Sage, PA-C  potassium chloride (KLOR-CON) 10 MEQ tablet Take 1 tablet (10 mEq total) by  mouth daily. 10/31/22  Yes Carroll Sage, PA-C  acetaminophen (TYLENOL) 325 MG tablet Take 2 tablets (650 mg total) by mouth every 6 (six) hours as needed for mild pain (or Fever >/= 101). 09/07/22   Drema Dallas, MD  albuterol (VENTOLIN HFA) 108 (90 Base) MCG/ACT inhaler Inhale 2 puffs into the lungs See admin instructions. Inhale 2 puffs into the lungs every 2-4 hours as needed for shortness of breath or wheezing 10/23/22   Glade Lloyd, MD  aspirin EC 81 MG tablet Take 1 tablet (81 mg total) by mouth daily. Swallow whole. 08/17/22   Lyndle Herrlich, MD  bisacodyl (DULCOLAX) 10 MG suppository Place 1 suppository (10 mg total) rectally daily as needed for moderate constipation. 10/23/22   Glade Lloyd, MD  clonazePAM (KLONOPIN) 1 MG tablet Take 1 tablet (1 mg total) by mouth 2 (two) times daily. 10/15/22   Mapp, Gaylyn Cheers, MD  dabrafenib mesylate (TAFINLAR) 75 MG capsule Take 2 capsules (150 mg total) by mouth 2 (two) times daily. Take on an empty stomach 1 hour before or 2 hours after meals. Patient taking differently: Take 75 mg by mouth daily. Take on an empty stomach 1 hour before or 2 hours after meals. 10/05/22   Johney Maine, MD  dexamethasone (DECADRON) 0.5 MG tablet Take 1 tablet (0.5 mg total) by mouth daily with breakfast. 10/05/22   Johney Maine, MD  fentaNYL (DURAGESIC) 25 MCG/HR Place 1 patch onto the skin every 3 (three) days. Patient taking differently: Place 1 patch onto the skin every 3 (three) days as needed (pain). 10/05/22   Candise Che,  Corene Cornea, MD  folic acid (FOLVITE) 1 MG tablet Take 1 mg by mouth daily.    [provider]  metoCLOPramide (REGLAN) 5 MG tablet Take 1 tablet (5 mg total) by mouth 3 (three) times daily before meals. 10/23/22   Glade Lloyd, MD  ondansetron (ZOFRAN-ODT) 8 MG disintegrating tablet Dissolve 1 tablet (8 mg total) by mouth every 8 (eight) hours as needed for nausea or vomiting. 10/23/22   Glade Lloyd, MD  pantoprazole  (PROTONIX) 40 MG tablet Take 1 tablet (40 mg total) by mouth 2 (two) times daily. 10/23/22 12/22/22  Glade Lloyd, MD  polyethylene glycol (MIRALAX / GLYCOLAX) 17 g packet Take 17 g by mouth 2 (two) times daily. 10/23/22   Glade Lloyd, MD  prochlorperazine (COMPAZINE) 10 MG tablet Take 1 tablet (10 mg total) by mouth every 6 (six) hours as needed for refractory nausea / vomiting. 10/23/22   Glade Lloyd, MD  senna-docusate (SENOKOT-S) 8.6-50 MG tablet Take 1 tablet by mouth 2 (two) times daily. 10/23/22   Glade Lloyd, MD  sertraline (ZOLOFT) 100 MG tablet Take 1 tablet (100 mg total) by mouth 2 (two) times daily. 09/13/22   Regalado, Belkys A, MD  SUMAtriptan (IMITREX) 50 MG tablet Take 50 mg by mouth See admin instructions. Take 50 mg total by mouth once as needed for migraine. May repeat in 2 hours if headache persists or recurs.    [provider]  trametinib dimethyl sulfoxide (MEKINIST) 2 MG tablet Take 1 tablet (2 mg total) by mouth daily. Take 1 hour before or 2 hours after a meal. Store refrigerated in original container. 10/05/22   Johney Maine, MD  traZODone (DESYREL) 150 MG tablet TOME UNA TABLETA POR VIA ORAL AL ACOSTARSE CUANDO SEA NECESARIO FOR SLEEP Patient taking differently: Take 150 mg by mouth at bedtime. 09/14/22   Mapp, Gaylyn Cheers, MD      Allergies    Percocet [oxycodone-acetaminophen], Lidocaine, Other, and Hydromorphone hcl    Review of Systems   Review of Systems  Constitutional:  Negative for chills and fever.  Respiratory:  Negative for shortness of breath.   Cardiovascular:  Negative for chest pain.  Gastrointestinal:  Positive for abdominal pain, constipation, nausea and vomiting.  Neurological:  Negative for headaches.    Physical Exam Updated Vital Signs BP (!) 144/90 (BP Location: Left Arm)   Pulse 88   Temp 98.3 F (36.8 C) (Oral)   Resp 16   Ht 5\' 2"  (1.575 m)   Wt 44 kg   LMP 02/08/2006   SpO2 99%   BMI 17.74 kg/m  Physical Exam Vitals  and nursing note reviewed.  Constitutional:      General: She is not in acute distress.    Appearance: She is not ill-appearing.  HENT:     Head: Normocephalic and atraumatic.     Nose: No congestion.  Eyes:     Conjunctiva/sclera: Conjunctivae normal.  Cardiovascular:     Rate and Rhythm: Regular rhythm. Tachycardia present.     Pulses: Normal pulses.     Heart sounds: No murmur heard.    No friction rub. No gallop.  Pulmonary:     Effort: No respiratory distress.     Breath sounds: No wheezing, rhonchi or rales.  Abdominal:     Palpations: Abdomen is soft.     Tenderness: There is abdominal tenderness. There is no right CVA tenderness or left CVA tenderness.     Comments: Abdomen nondistended, soft, noted epigastric tenderness right  lower quadrant, no guarding rebound or peritoneal sign.  Musculoskeletal:     Right lower leg: No edema.     Left lower leg: No edema.  Skin:    General: Skin is warm and dry.  Neurological:     Mental Status: She is alert.  Psychiatric:        Mood and Affect: Mood normal.     ED Results / Procedures / Treatments   Labs (all labs ordered are listed, but only abnormal results are displayed) Labs Reviewed  COMPREHENSIVE METABOLIC PANEL - Abnormal; Notable for the following components:      Result Value   Sodium 132 (*)    Potassium 2.7 (*)    CO2 20 (*)    Glucose, Bld 117 (*)    All other components within normal limits  URINALYSIS, ROUTINE W REFLEX MICROSCOPIC - Abnormal; Notable for the following components:   Ketones, ur 5 (*)    Protein, ur 30 (*)    All other components within normal limits  LIPASE, BLOOD  CBC  MAGNESIUM  LACTIC ACID, PLASMA  TROPONIN I (HIGH SENSITIVITY)  TROPONIN I (HIGH SENSITIVITY)    EKG EKG Interpretation  Date/Time:  Saturday Oct 30 2022 22:21:02 EDT Ventricular Rate:  101 PR Interval:    QRS Duration: 107 QT Interval:  347 QTC Calculation: 450 R Axis:   93 Text Interpretation: Undetermined  rhythm Borderline right axis deviation Borderline T wave abnormalities When compared with ECG of 09/09/2022, No significant change was found Confirmed by Dione Booze ([id number]) on 10/30/2022 10:42:02 PM  Radiology CT ABDOMEN PELVIS W CONTRAST  Result Date: 10/31/2022 CLINICAL DATA:  Abdominal pain, acute, nonlocalized. History of metastatic lung cancer. EXAM: CT ABDOMEN AND PELVIS WITH CONTRAST TECHNIQUE: Multidetector CT imaging of the abdomen and pelvis was performed using the standard protocol following bolus administration of intravenous contrast. RADIATION DOSE REDUCTION: This exam was performed according to the departmental dose-optimization program which includes automated exposure control, adjustment of the mA and/or kV according to patient size and/or use of iterative reconstruction technique. CONTRAST:  80mL OMNIPAQUE IOHEXOL 300 MG/ML  SOLN COMPARISON:  CT abdomen pelvis 10/17/2022, ultrasound abdomen 10/17/2022 FINDINGS: Lower chest: No acute abnormality. Hepatobiliary: No focal liver abnormality. Hydropic gallbladder. No gallstones, gallbladder wall thickening, or pericholecystic fluid. No biliary dilatation. Pancreas: No focal lesion. Normal pancreatic contour. No surrounding inflammatory changes. No main pancreatic ductal dilatation. Spleen: Nonspecific calcification. Normal in size without focal abnormality. Adrenals/Urinary Tract: No adrenal nodule bilaterally. Bilateral kidneys enhance symmetrically. 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 No hydronephrosis. No hydroureter.  No nephroureterolithiasis. The urinary bladder is unremarkable. On delayed imaging, there is no urothelial wall thickening and there are no filling defects in the opacified portions of the bilateral collecting systems or ureters. Stomach/Bowel: Stomach is within normal limits. No evidence of bowel wall thickening or dilatation. Appendix appears normal. Vascular/Lymphatic: No abdominal aorta or iliac  aneurysm. No abdominal, pelvic, or inguinal lymphadenopathy. Reproductive: Status post hysterectomy. No adnexal masses. Other: No intraperitoneal free fluid. No intraperitoneal free gas. No organized fluid collection. Musculoskeletal: No abdominal wall hernia or abnormality. Redemonstration of scattered appendicular and axial sclerotic metastatic lesions. No acute displaced fracture. Multilevel degenerative changes of the spine. IMPRESSION: 1. Nonspecific hydropic gallbladder. 2. Scattered appendicular and axial sclerotic metastatic lesions. Electronically Signed   By: Tish Frederickson M.D.   On: 10/31/2022 03:32   DG Chest 2 View  Result Date: 10/30/2022 CLINICAL DATA:  Nausea and vomiting. EXAM: CHEST -  2 VIEW COMPARISON:  Oct 20, 2022 FINDINGS: There is stable right-sided venous Port-A-Cath positioning. The heart size and mediastinal contours are within normal limits. Both lungs are clear. The visualized skeletal structures are unremarkable. IMPRESSION: No active cardiopulmonary disease. Electronically Signed   By: Aram Candela M.D.   On: 10/30/2022 23:38    Procedures Procedures    Medications Ordered in ED Medications  sodium chloride 0.9 % bolus 1,000 mL (0 mLs Intravenous Stopped 10/31/22 0116)  ondansetron (ZOFRAN) injection 4 mg (4 mg Intravenous Given 10/30/22 2255)  potassium chloride 10 mEq in 100 mL IVPB (0 mEq Intravenous Stopped 10/31/22 0336)  potassium chloride SA (KLOR-CON M) CR tablet 20 mEq (20 mEq Oral Given 10/30/22 2350)  potassium chloride 10 mEq in 100 mL IVPB (0 mEq Intravenous Stopped 10/31/22 0053)  metoCLOPramide (REGLAN) injection 10 mg (10 mg Intravenous Given 10/31/22 0229)  iohexol (OMNIPAQUE) 300 MG/ML solution 100 mL (80 mLs Intravenous Contrast Given 10/31/22 0321)    ED Course/ Medical Decision Making/ A&P                             Medical Decision Making Amount and/or Complexity of Data Reviewed Labs: ordered. Radiology: ordered.  Risk Prescription  drug management.   This patient presents to the ED for concern of abdominal pain, this involves an extensive number of treatment options, and is a complaint that carries with it a high risk of complications and morbidity.  The differential diagnosis includes bowel obstruction, diverticulitis, volvulus, metastatic disease, UTI, PE, ACS    Additional history obtained:  Additional history obtained from N/A External records from outside source obtained and reviewed including oncology notes   Co morbidities that complicate the patient evaluation  Metastatic lung cancer currently on chemo  Social Determinants of Health:  Non-English speaking    Lab Tests:  I Ordered, and personally interpreted labs.  The pertinent results include: CBC is unremarkable, CMP reveals sodium 132, potassium 2.7, CO2 20, glucose 117, lipase 47, magnesium 1.9, lactic 0.9, UA is unremarkable   Imaging Studies ordered:  I ordered imaging studies including chest x-ray, CT abdomen pelvis I independently visualized and interpreted imaging which showed negative for acute findings by history, negative for acute findings I agree with the radiologist interpretation   Cardiac Monitoring:  The patient was maintained on a cardiac monitor.  I personally viewed and interpreted the cardiac monitored which showed an underlying rhythm of: Without signs of ischemia   Medicines ordered and prescription drug management:  I ordered medication including fluids, antiemetics I have reviewed the patients home medicines and have made adjustments as needed  Critical Interventions:  N/A   Reevaluation:  Presents with abdominal pain, nausea and vomiting, will obtain lab workup imaging and reassess  Patient is noted low potassium likely from nausea and vomiting, will obtain a magnesium, provide her with supplemental potassium.  Reassessed states she still has some abdominal pain, offered pain medication but she deferred,  she is tolerating p.o., but due to her history of metastatic disease and has not had a bowel movement over a week we will send him for repeat CT abdomen pelvis.  Reassessed tolerating p.o. patient has tolerated oral potassium pill, has had applesauce, and drinking without any vomiting.  Patient had concerns of being constipated, states that she has not had a bowel movement since her discharge on the on the fourth, states that she was taking at home laxatives without much  relief, I offered patient enema here in the hospital to help with bowel movement, she was in agreement with this.  I explained to her that likely her constipation is  from being on opiates, I explained the importance of good digestive health which includes hydration, high-fiber diet, exercise, and the importance of following up with oncology for further evaluation.  I explained that overall there is no acute abnormalities at this time requiring hospital admission, including but not limited to bowel obstructions, volvulus, intra-abdominal infections, GI bleeds, and this is likely a chronic issue which will need further management as an outpatient setting, I explained that I can provide her with symptom management at this time.  Patient had large bowel movement, reassessed the patient, she states that she has concerns about being discharged since she is losing weight, had extensive conversation about this is likely due for her disease process metastatic cancer.offered suggestions to help with ways to continue to get nutrition,I aslo  recommend that she needs to follow-up with her oncologist.   Consultations Obtained:  N/a    Test Considered:  N/a    Rule out Suspicion for bowel obstruction, volvulus, diverticulitis, appendicitis, kidney stone, Pilo, AAA, dissection is low at this time his CT imaging is all negative these findings.  I doubt UTI does not endorse any urinary symptoms, UA is negative for signs infection or hematuria.   I have low suspicion for ACS as history is atypical, patient has no cardiac history, EKG was sinus rhythm without signs of ischemia, patient had negative delta troponin.  Low suspicion for PE low at this time denies shortness of breath pleuritic chest pain she is nontachypneic nonhypoxic presentation atypical etiology.    Dispostion and problem list  After consideration of the diagnostic results and the patients response to treatment, I feel that the patent would benefit from discharge.  Constipation-acute on chronic, likely secondary due to chronic opioid use, I recommend good digestive health, continue with the laxatives that were prescribed to her, and follow-up with her oncologist. Hypokalemia-likely secondary due to poor oral intake as well as vomiting, will discharge home with oral potassium pills.             Final Clinical Impression(s) / ED Diagnoses Final diagnoses:  Hypokalemia  Drug-induced constipation    Rx / DC Orders ED Discharge Orders          Ordered    ondansetron (ZOFRAN) 4 MG tablet  Every 6 hours        10/31/22 0649    potassium chloride (KLOR-CON) 10 MEQ tablet  Daily        10/31/22 0649              Carroll Sage, PA-C 10/31/22 4098    Dione Booze, MD 10/31/22 [phone]

## 2022-10-30 NOTE — ED Triage Notes (Addendum)
Pt was BIB by [facility] from home - pt has had N/V for 6 days but today Zofran did not relieve.  Pt was diagnosed with Lung Cancer in January.  Pt states she has not been able to drink or eat anything in 6 days and has lost weight.

## 2022-10-31 ENCOUNTER — Emergency Department (HOSPITAL_COMMUNITY): Payer: Commercial Managed Care - HMO

## 2022-10-31 LAB — TROPONIN I (HIGH SENSITIVITY)
Troponin I (High Sensitivity): 5 ng/L (ref <18)
Troponin I (High Sensitivity): 5 ng/L (ref <18)

## 2022-10-31 LAB — MAGNESIUM: Magnesium: 1.9 mg/dL (ref 1.7–2.4)

## 2022-10-31 LAB — LACTIC ACID, PLASMA: Lactic Acid, Venous: 0.9 mmol/L (ref 0.5–1.9)

## 2022-10-31 MED ORDER — IOHEXOL 300 MG/ML  SOLN
100.0000 mL | Freq: Once | INTRAMUSCULAR | Status: AC | PRN
  Administered 2022-10-31: 80 mL via INTRAVENOUS

## 2022-10-31 MED ORDER — POTASSIUM CHLORIDE ER 10 MEQ PO TBCR: 10.0000 meq | EXTENDED_RELEASE_TABLET | Freq: Every day | ORAL | 0 refills | Status: DC

## 2022-10-31 MED ORDER — METOCLOPRAMIDE HCL 5 MG/ML IJ SOLN
10.0000 mg | Freq: Once | INTRAMUSCULAR | Status: AC
  Administered 2022-10-31: 10 mg via INTRAVENOUS
  Filled 2022-10-31: qty 2

## 2022-10-31 MED ORDER — ONDANSETRON HCL 4 MG PO TABS: 4.0000 mg | ORAL_TABLET | Freq: Four times a day (QID) | ORAL | 0 refills | Status: DC

## 2022-10-31 NOTE — ED Notes (Signed)
Pt given ice chips for PO challenge °

## 2022-10-31 NOTE — ED Notes (Signed)
Pt attempted to call husband with no answer, pt states husband does not get off work until 0900

## 2022-10-31 NOTE — Discharge Instructions (Signed)
Constipation-suspect this is coming from the medication that you are taking, is very important that you stay hydrated, please drink plenty of water as this will help with bowel movements, I also want t you to eat foods that are high in fiber like legumes, leafy green vegetables, cereals, this can help with bowel movements.  Also recommend exercising walking is much as possible as this can also help with bowel regimens.  I want you to continue taking MiraLAX, this medication only works if you are hydrated, please continue to take this until you have regular bowel movements. Low potassium-your potassium was low today, started on potassium pills please take as prescribed, want to follow-up with your primary doctor for reassessment.  I highly recommend that you follow-up with your oncologist, they can help manage the symptoms, please call their office for further assessment.  Come back to the emergency department if you develop chest pain, shortness of breath, severe abdominal pain, uncontrolled nausea, vomiting, diarrhea.

## 2022-11-01 ENCOUNTER — Other Ambulatory Visit: Payer: Self-pay

## 2022-11-01 ENCOUNTER — Other Ambulatory Visit (HOSPITAL_COMMUNITY): Payer: Self-pay

## 2022-11-02 ENCOUNTER — Other Ambulatory Visit: Payer: Self-pay

## 2022-11-02 ENCOUNTER — Telehealth: Payer: Self-pay | Admitting: Hematology

## 2022-11-02 ENCOUNTER — Other Ambulatory Visit (HOSPITAL_COMMUNITY): Payer: Self-pay
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
## 2022-11-04 ENCOUNTER — Other Ambulatory Visit: Payer: Self-pay

## 2022-11-04 ENCOUNTER — Inpatient Hospital Stay: Payer: Commercial Managed Care - HMO

## 2022-11-04 ENCOUNTER — Inpatient Hospital Stay: Payer: Commercial Managed Care - HMO | Attending: Hematology | Admitting: Nurse Practitioner

## 2022-11-04 ENCOUNTER — Other Ambulatory Visit: Payer: Self-pay | Admitting: *Deleted

## 2022-11-04 ENCOUNTER — Encounter: Payer: Self-pay | Admitting: Nurse Practitioner

## 2022-11-04 ENCOUNTER — Ambulatory Visit: Payer: Commercial Managed Care - HMO

## 2022-11-04 VITALS — BP 115/92 | HR 102 | Temp 97.7°F | Resp 17 | Ht 62.0 in | Wt 86.4 lb

## 2022-11-04 DIAGNOSIS — K589 Irritable bowel syndrome without diarrhea: Secondary | ICD-10-CM | POA: Insufficient documentation

## 2022-11-04 DIAGNOSIS — R634 Abnormal weight loss: Secondary | ICD-10-CM

## 2022-11-04 DIAGNOSIS — Z515 Encounter for palliative care: Secondary | ICD-10-CM

## 2022-11-04 DIAGNOSIS — Z95828 Presence of other vascular implants and grafts: Secondary | ICD-10-CM

## 2022-11-04 DIAGNOSIS — C3490 Malignant neoplasm of unspecified part of unspecified bronchus or lung: Secondary | ICD-10-CM

## 2022-11-04 DIAGNOSIS — K5903 Drug induced constipation: Secondary | ICD-10-CM

## 2022-11-04 DIAGNOSIS — G893 Neoplasm related pain (acute) (chronic): Secondary | ICD-10-CM

## 2022-11-04 DIAGNOSIS — Z7982 Long term (current) use of aspirin: Secondary | ICD-10-CM | POA: Diagnosis not present

## 2022-11-04 DIAGNOSIS — G43909 Migraine, unspecified, not intractable, without status migrainosus: Secondary | ICD-10-CM | POA: Insufficient documentation

## 2022-11-04 DIAGNOSIS — E876 Hypokalemia: Secondary | ICD-10-CM | POA: Diagnosis not present

## 2022-11-04 DIAGNOSIS — I10 Essential (primary) hypertension: Secondary | ICD-10-CM | POA: Diagnosis not present

## 2022-11-04 DIAGNOSIS — Z79899 Other long term (current) drug therapy: Secondary | ICD-10-CM | POA: Diagnosis not present

## 2022-11-04 DIAGNOSIS — Z8711 Personal history of peptic ulcer disease: Secondary | ICD-10-CM | POA: Insufficient documentation

## 2022-11-04 DIAGNOSIS — F32A Depression, unspecified: Secondary | ICD-10-CM | POA: Diagnosis not present

## 2022-11-04 DIAGNOSIS — E43 Unspecified severe protein-calorie malnutrition: Secondary | ICD-10-CM | POA: Insufficient documentation

## 2022-11-04 DIAGNOSIS — R530 Neoplastic (malignant) related fatigue: Secondary | ICD-10-CM

## 2022-11-04 DIAGNOSIS — C7951 Secondary malignant neoplasm of bone: Secondary | ICD-10-CM | POA: Diagnosis present

## 2022-11-04 DIAGNOSIS — R110 Nausea: Secondary | ICD-10-CM

## 2022-11-04 DIAGNOSIS — Z7189 Other specified counseling: Secondary | ICD-10-CM

## 2022-11-04 LAB — CBC WITH DIFFERENTIAL (CANCER CENTER ONLY)
Abs Immature Granulocytes: 0.01 10*3/uL (ref 0.00–0.07)
Basophils Absolute: 0 10*3/uL (ref 0.0–0.1)
Basophils Relative: 0 %
Eosinophils Absolute: 0 10*3/uL (ref 0.0–0.5)
Eosinophils Relative: 0 %
HCT: 34.7 % — ABNORMAL LOW (ref 36.0–46.0)
Hemoglobin: 12.8 g/dL (ref 12.0–15.0)
Immature Granulocytes: 0 %
Lymphocytes Relative: 31 %
Lymphs Abs: 1.5 10*3/uL (ref 0.7–4.0)
MCH: 33 pg (ref 26.0–34.0)
MCHC: 36.9 g/dL — ABNORMAL HIGH (ref 30.0–36.0)
MCV: 89.4 fL (ref 80.0–100.0)
Monocytes Absolute: 0.4 10*3/uL (ref 0.1–1.0)
Monocytes Relative: 7 %
Neutro Abs: 3 10*3/uL (ref 1.7–7.7)
Neutrophils Relative %: 62 %
Platelet Count: 306 10*3/uL (ref 150–400)
RBC: 3.88 MIL/uL (ref 3.87–5.11)
RDW: 13 % (ref 11.5–15.5)
WBC Count: 4.9 10*3/uL (ref 4.0–10.5)
nRBC: 0 % (ref 0.0–0.2)

## 2022-11-04 LAB — CMP (CANCER CENTER ONLY)
ALT: 18 U/L (ref 0–44)
AST: 21 U/L (ref 15–41)
Albumin: 4.6 g/dL (ref 3.5–5.0)
Alkaline Phosphatase: 58 U/L (ref 38–126)
Anion gap: 10 (ref 5–15)
BUN: 10 mg/dL (ref 6–20)
CO2: 27 mmol/L (ref 22–32)
Calcium: 9.2 mg/dL (ref 8.9–10.3)
Chloride: 98 mmol/L (ref 98–111)
Creatinine: 0.8 mg/dL (ref 0.44–1.00)
GFR, Estimated: >60 mL/min (ref >60)
Glucose, Bld: 127 mg/dL — ABNORMAL HIGH (ref 70–99)
Potassium: 2.8 mmol/L — ABNORMAL LOW (ref 3.5–5.1)
Sodium: 135 mmol/L (ref 135–145)
Total Bilirubin: 0.5 mg/dL (ref 0.3–1.2)
Total Protein: 7.2 g/dL (ref 6.5–8.1)

## 2022-11-04 LAB — MAGNESIUM: Magnesium: 1.9 mg/dL (ref 1.7–2.4)

## 2022-11-04 MED ORDER — FAMOTIDINE 20 MG IN NS 100 ML IVPB
20.0000 mg | Freq: Once | INTRAVENOUS | Status: AC
  Administered 2022-11-04: 20 mg via INTRAVENOUS
  Filled 2022-11-04: qty 100

## 2022-11-04 MED ORDER — KETOROLAC TROMETHAMINE 30 MG/ML IJ SOLN
30.0000 mg | Freq: Once | INTRAMUSCULAR | Status: AC
  Administered 2022-11-04: 30 mg via INTRAVENOUS
  Filled 2022-11-04: qty 1

## 2022-11-04 MED ORDER — METOCLOPRAMIDE HCL 5 MG/ML IJ SOLN
10.0000 mg | Freq: Once | INTRAMUSCULAR | Status: AC
  Administered 2022-11-04: 10 mg via INTRAVENOUS
  Filled 2022-11-04: qty 2

## 2022-11-04 MED ORDER — SORBITOL 70 % PO SOLN
15.0000 mL | Freq: Every day | ORAL | 0 refills | Status: DC | PRN
Start: 2022-11-04 — End: 2023-03-07

## 2022-11-04 MED ORDER — POTASSIUM CHLORIDE CRYS ER 20 MEQ PO TBCR
20.0000 meq | EXTENDED_RELEASE_TABLET | Freq: Two times a day (BID) | ORAL | 0 refills | Status: DC
Start: 2022-11-04 — End: 2022-11-24

## 2022-11-04 MED ORDER — MAGNESIUM SULFATE 2 GM/50ML IV SOLN
2.0000 g | Freq: Once | INTRAVENOUS | Status: AC
  Administered 2022-11-04: 2 g via INTRAVENOUS
  Filled 2022-11-04: qty 50

## 2022-11-04 MED ORDER — SODIUM CHLORIDE 0.9% FLUSH
10.0000 mL | Freq: Once | INTRAVENOUS | Status: AC
  Administered 2022-11-04: 10 mL

## 2022-11-04 MED ORDER — POTASSIUM CHLORIDE CRYS ER 20 MEQ PO TBCR
40.0000 meq | EXTENDED_RELEASE_TABLET | Freq: Once | ORAL | Status: AC
  Administered 2022-11-04: 40 meq via ORAL
  Filled 2022-11-04: qty 2

## 2022-11-04 MED ORDER — HEPARIN SOD (PORK) LOCK FLUSH 100 UNIT/ML IV SOLN
500.0000 [IU] | Freq: Once | INTRAVENOUS | Status: AC
  Administered 2022-11-04: 500 [IU]

## 2022-11-04 MED ORDER — SODIUM CHLORIDE 0.9 % IV SOLN
10.0000 mg | Freq: Once | INTRAVENOUS | Status: AC
  Administered 2022-11-04: 10 mg via INTRAVENOUS
  Filled 2022-11-04: qty 10

## 2022-11-04 MED ORDER — SODIUM CHLORIDE 0.9 % IV SOLN: Freq: Once | INTRAVENOUS | Status: AC

## 2022-11-04 MED ORDER — MAGNESIUM CITRATE PO SOLN
1.0000 | Freq: Once | ORAL | 0 refills | Status: AC
Start: 2022-11-04 — End: 2022-11-04

## 2022-11-04 MED ORDER — FENTANYL 25 MCG/HR TD PT72
1.0000 | MEDICATED_PATCH | TRANSDERMAL | 0 refills | Status: DC
Start: 2022-11-04 — End: 2022-12-15

## 2022-11-04 MED ORDER — POTASSIUM CHLORIDE 10 MEQ/100ML IV SOLN
10.0000 meq | INTRAVENOUS | Status: AC
  Administered 2022-11-04 (×3): 10 meq via INTRAVENOUS
  Filled 2022-11-04 (×3): qty 100

## 2022-11-04 MED ORDER — DEXAMETHASONE 4 MG PO TABS
4.0000 mg | ORAL_TABLET | Freq: Every day | ORAL | 0 refills | Status: DC
Start: 2022-11-04 — End: 2022-12-13

## 2022-11-04 MED ORDER — ONDANSETRON HCL 4 MG/2ML IJ SOLN
8.0000 mg | Freq: Once | INTRAMUSCULAR | Status: AC
  Administered 2022-11-04: 8 mg via INTRAVENOUS
  Filled 2022-11-04: qty 4

## 2022-11-04 MED ORDER — FAMOTIDINE 20 MG IN NS 100 ML IVPB: 20.0000 mg | Freq: Once | INTRAVENOUS | Status: DC

## 2022-11-09 ENCOUNTER — Other Ambulatory Visit (HOSPITAL_COMMUNITY): Payer: Self-pay
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
## 2022-11-10 ENCOUNTER — Inpatient Hospital Stay: Payer: Commercial Managed Care - HMO | Admitting: Nurse Practitioner

## 2022-11-10 ENCOUNTER — Other Ambulatory Visit: Payer: Self-pay

## 2022-11-10 ENCOUNTER — Inpatient Hospital Stay (HOSPITAL_BASED_OUTPATIENT_CLINIC_OR_DEPARTMENT_OTHER): Payer: Commercial Managed Care - HMO | Admitting: Hematology

## 2022-11-10 ENCOUNTER — Inpatient Hospital Stay: Payer: Commercial Managed Care - HMO

## 2022-11-10 VITALS — BP 118/85 | HR 106 | Temp 97.7°F | Resp 18 | Wt 85.1 lb

## 2022-11-10 DIAGNOSIS — R1312 Dysphagia, oropharyngeal phase: Secondary | ICD-10-CM

## 2022-11-10 DIAGNOSIS — C3490 Malignant neoplasm of unspecified part of unspecified bronchus or lung: Secondary | ICD-10-CM | POA: Diagnosis not present

## 2022-11-10 DIAGNOSIS — R112 Nausea with vomiting, unspecified: Secondary | ICD-10-CM | POA: Diagnosis not present

## 2022-11-10 DIAGNOSIS — C419 Malignant neoplasm of bone and articular cartilage, unspecified: Secondary | ICD-10-CM | POA: Diagnosis not present

## 2022-11-10 LAB — CMP (CANCER CENTER ONLY)
ALT: 27 U/L (ref 0–44)
AST: 26 U/L (ref 15–41)
Albumin: 4.6 g/dL (ref 3.5–5.0)
Alkaline Phosphatase: 58 U/L (ref 38–126)
Anion gap: 8 (ref 5–15)
BUN: 9 mg/dL (ref 6–20)
CO2: 25 mmol/L (ref 22–32)
Calcium: 9.5 mg/dL (ref 8.9–10.3)
Chloride: 101 mmol/L (ref 98–111)
Creatinine: 0.79 mg/dL (ref 0.44–1.00)
GFR, Estimated: >60 mL/min (ref >60)
Glucose, Bld: 124 mg/dL — ABNORMAL HIGH (ref 70–99)
Potassium: 4.2 mmol/L (ref 3.5–5.1)
Sodium: 134 mmol/L — ABNORMAL LOW (ref 135–145)
Total Bilirubin: 0.7 mg/dL (ref 0.3–1.2)
Total Protein: 7.1 g/dL (ref 6.5–8.1)

## 2022-11-10 LAB — CBC WITH DIFFERENTIAL (CANCER CENTER ONLY)
Abs Immature Granulocytes: 0.02 10*3/uL (ref 0.00–0.07)
Basophils Absolute: 0.1 10*3/uL (ref 0.0–0.1)
Basophils Relative: 1 %
Eosinophils Absolute: 0.1 10*3/uL (ref 0.0–0.5)
Eosinophils Relative: 1 %
HCT: 37.8 % (ref 36.0–46.0)
Hemoglobin: 13.2 g/dL (ref 12.0–15.0)
Immature Granulocytes: 0 %
Lymphocytes Relative: 26 %
Lymphs Abs: 2.1 10*3/uL (ref 0.7–4.0)
MCH: 32.4 pg (ref 26.0–34.0)
MCHC: 34.9 g/dL (ref 30.0–36.0)
MCV: 92.6 fL (ref 80.0–100.0)
Monocytes Absolute: 0.4 10*3/uL (ref 0.1–1.0)
Monocytes Relative: 5 %
Neutro Abs: 5.5 10*3/uL (ref 1.7–7.7)
Neutrophils Relative %: 67 %
Platelet Count: 334 10*3/uL (ref 150–400)
RBC: 4.08 MIL/uL (ref 3.87–5.11)
RDW: 13.2 % (ref 11.5–15.5)
WBC Count: 8.1 10*3/uL (ref 4.0–10.5)
nRBC: 0 % (ref 0.0–0.2)

## 2022-11-10 LAB — MAGNESIUM: Magnesium: 1.8 mg/dL (ref 1.7–2.4)

## 2022-11-10 NOTE — Progress Notes (Signed)
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 and to monitor for any nausea -patient would like to proceed with treatment -informed patient that taking medication regularly and having adequate food-intake is required with her decision to continue treatment -Previous scans showed constipation but no other abdominal issues -will order swallow study for further evaluation of swallowing issues -will refer patient to GI for further evaluation of nausea and swallowing issues. GI may evaluate for any stomach ulcers.  -will stop dabrafenib + Trametinib to ensure that the medication is not causing nausea -will order brain scan to rule out any findings that may cause nausea -If nausea persists despite GI intervention and stopping treatment medications, nausea may be related to depression/anxiety -recommend patient to regularly follow-up closely with a psychiatrist to to manage depression  FOLLOW  UP: MRI brain ASAP GI referral for evaluation of persistent nausea and dysphagia RTC with Dr Candise Che with labs in 3 weeks Close f/u woth psych to mx depression.  The total time spent in the  appointment was 41 minutes* .  All of the patient's questions were answered with apparent satisfaction. The patient knows to call the clinic with any problems, questions or concerns.   Wyvonnia Lora MD MS AAHIVMS [facility] Hematology/Oncology Physician [facility]  .*Total Encounter Time as defined by the Centers for Medicare and Medicaid Services includes, in addition to the face-to-face time of a patient visit (documented in the note above) non-face-to-face time: obtaining and reviewing outside history, ordering and reviewing medications, tests or procedures, care coordination (communications with other health care professionals or caregivers) and documentation in the medical record.    I,Mitra Faeizi,acting as a Neurosurgeon for Wyvonnia Lora, MD.,have documented all relevant documentation on the behalf of Wyvonnia Lora, MD,as directed by  Wyvonnia Lora, MD while in the presence of Wyvonnia Lora, MD.  .I have reviewed the above documentation for accuracy and completeness, and I agree with the above. Johney Maine MD

## 2022-11-11 ENCOUNTER — Other Ambulatory Visit (HOSPITAL_COMMUNITY): Payer: Self-pay

## 2022-11-16 ENCOUNTER — Encounter: Payer: Self-pay | Admitting: Hematology

## 2022-11-18 ENCOUNTER — Telehealth (HOSPITAL_COMMUNITY): Payer: Self-pay

## 2022-11-18 NOTE — Telephone Encounter (Signed)
Attempted to contact patient via interpreter services - could not leave voicemail on patients phone. Left vm on spouses phone to schedule OP Modified Barium Swallow.

## 2022-11-24 ENCOUNTER — Other Ambulatory Visit: Payer: Self-pay | Admitting: Nurse Practitioner

## 2022-11-24 ENCOUNTER — Other Ambulatory Visit (HOSPITAL_COMMUNITY): Payer: Self-pay

## 2022-11-24 ENCOUNTER — Other Ambulatory Visit: Payer: Self-pay

## 2022-11-24 DIAGNOSIS — E876 Hypokalemia: Secondary | ICD-10-CM

## 2022-11-24 DIAGNOSIS — C3490 Malignant neoplasm of unspecified part of unspecified bronchus or lung: Secondary | ICD-10-CM

## 2022-11-24 DIAGNOSIS — Z515 Encounter for palliative care: Secondary | ICD-10-CM

## 2022-11-24 MED ORDER — POTASSIUM CHLORIDE CRYS ER 20 MEQ PO TBCR
20.0000 meq | EXTENDED_RELEASE_TABLET | Freq: Every day | ORAL | 2 refills | Status: DC
Start: 2022-11-24 — End: 2022-12-31
  Filled 2022-11-24: qty 30, 30d supply, fill #0
  Filled 2022-12-23: qty 30, 30d supply, fill #1

## 2022-11-24 NOTE — Telephone Encounter (Signed)
From: Sharlyn Bologna To: Mayra Reel, NP Sent: 11/24/2022 9:03 AM EDT Subject: Medication Renewal Request  Refills have been requested for the following medications:   potassium chloride SA (KLOR-CON M) 20 MEQ tablet Jake Samples Pickenpack-Cousar]  Preferred pharmacy: [facility] Delivery method: Daryll Drown

## 2022-11-25 ENCOUNTER — Encounter: Payer: Self-pay | Admitting: Hematology

## 2022-11-25 ENCOUNTER — Telehealth (HOSPITAL_COMMUNITY): Payer: Self-pay

## 2022-11-25 NOTE — Telephone Encounter (Signed)
2nd attempt to contact patient and spouse of patient to schedule OP Modified Barium Swallow - left voicemail via interpreter services.

## 2022-11-29 ENCOUNTER — Telehealth: Payer: Self-pay | Admitting: Hematology

## 2022-11-29 NOTE — Telephone Encounter (Signed)
Patient's voicemail box was full, unable to leave a message regarding appointments. Also patient contacts was unavailable as well.

## 2022-11-30 ENCOUNTER — Other Ambulatory Visit: Payer: Self-pay | Admitting: Physician Assistant

## 2022-11-30 DIAGNOSIS — C3490 Malignant neoplasm of unspecified part of unspecified bronchus or lung: Secondary | ICD-10-CM

## 2022-12-01 ENCOUNTER — Ambulatory Visit (INDEPENDENT_AMBULATORY_CARE_PROVIDER_SITE_OTHER): Payer: Commercial Managed Care - HMO | Admitting: Neurology

## 2022-12-01 ENCOUNTER — Encounter: Payer: Self-pay | Admitting: Neurology

## 2022-12-01 ENCOUNTER — Other Ambulatory Visit: Payer: Self-pay

## 2022-12-01 ENCOUNTER — Inpatient Hospital Stay: Payer: Commercial Managed Care - HMO | Attending: Hematology

## 2022-12-01 ENCOUNTER — Other Ambulatory Visit (HOSPITAL_COMMUNITY): Payer: Self-pay

## 2022-12-01 ENCOUNTER — Inpatient Hospital Stay (HOSPITAL_BASED_OUTPATIENT_CLINIC_OR_DEPARTMENT_OTHER): Payer: Commercial Managed Care - HMO | Admitting: Physician Assistant

## 2022-12-01 VITALS — BP 112/74 | HR 92 | Temp 98.1°F | Resp 17 | Wt 85.1 lb

## 2022-12-01 VITALS — BP 100/66 | HR 84 | Ht 62.0 in | Wt 84.2 lb

## 2022-12-01 DIAGNOSIS — Z7902 Long term (current) use of antithrombotics/antiplatelets: Secondary | ICD-10-CM | POA: Diagnosis not present

## 2022-12-01 DIAGNOSIS — I639 Cerebral infarction, unspecified: Secondary | ICD-10-CM

## 2022-12-01 DIAGNOSIS — G459 Transient cerebral ischemic attack, unspecified: Secondary | ICD-10-CM

## 2022-12-01 DIAGNOSIS — R1312 Dysphagia, oropharyngeal phase: Secondary | ICD-10-CM | POA: Diagnosis not present

## 2022-12-01 DIAGNOSIS — F32A Depression, unspecified: Secondary | ICD-10-CM | POA: Diagnosis not present

## 2022-12-01 DIAGNOSIS — Z79899 Other long term (current) drug therapy: Secondary | ICD-10-CM | POA: Diagnosis not present

## 2022-12-01 DIAGNOSIS — C3490 Malignant neoplasm of unspecified part of unspecified bronchus or lung: Secondary | ICD-10-CM | POA: Insufficient documentation

## 2022-12-01 DIAGNOSIS — Z8615 Personal history of latent tuberculosis infection: Secondary | ICD-10-CM | POA: Diagnosis not present

## 2022-12-01 DIAGNOSIS — Z7952 Long term (current) use of systemic steroids: Secondary | ICD-10-CM | POA: Insufficient documentation

## 2022-12-01 DIAGNOSIS — R112 Nausea with vomiting, unspecified: Secondary | ICD-10-CM | POA: Diagnosis not present

## 2022-12-01 DIAGNOSIS — E782 Mixed hyperlipidemia: Secondary | ICD-10-CM

## 2022-12-01 DIAGNOSIS — R110 Nausea: Secondary | ICD-10-CM | POA: Diagnosis not present

## 2022-12-01 DIAGNOSIS — F419 Anxiety disorder, unspecified: Secondary | ICD-10-CM | POA: Insufficient documentation

## 2022-12-01 DIAGNOSIS — Z7982 Long term (current) use of aspirin: Secondary | ICD-10-CM | POA: Insufficient documentation

## 2022-12-01 DIAGNOSIS — C7951 Secondary malignant neoplasm of bone: Secondary | ICD-10-CM | POA: Diagnosis present

## 2022-12-01 LAB — CMP (CANCER CENTER ONLY)
ALT: 19 U/L (ref 0–44)
AST: 26 U/L (ref 15–41)
Albumin: 3.6 g/dL (ref 3.5–5.0)
Alkaline Phosphatase: 58 U/L (ref 38–126)
Anion gap: 6 (ref 5–15)
BUN: 7 mg/dL (ref 6–20)
CO2: 28 mmol/L (ref 22–32)
Calcium: 9.1 mg/dL (ref 8.9–10.3)
Chloride: 104 mmol/L (ref 98–111)
Creatinine: 0.69 mg/dL (ref 0.44–1.00)
GFR, Estimated: >60 mL/min (ref >60)
Glucose, Bld: 123 mg/dL — ABNORMAL HIGH (ref 70–99)
Potassium: 4.2 mmol/L (ref 3.5–5.1)
Sodium: 138 mmol/L (ref 135–145)
Total Bilirubin: 0.3 mg/dL (ref 0.3–1.2)
Total Protein: 6.2 g/dL — ABNORMAL LOW (ref 6.5–8.1)

## 2022-12-01 LAB — CBC WITH DIFFERENTIAL (CANCER CENTER ONLY)
Abs Immature Granulocytes: 0.02 10*3/uL (ref 0.00–0.07)
Basophils Absolute: 0 10*3/uL (ref 0.0–0.1)
Basophils Relative: 1 %
Eosinophils Absolute: 0.1 10*3/uL (ref 0.0–0.5)
Eosinophils Relative: 1 %
HCT: 32.8 % — ABNORMAL LOW (ref 36.0–46.0)
Hemoglobin: 10.8 g/dL — ABNORMAL LOW (ref 12.0–15.0)
Immature Granulocytes: 0 %
Lymphocytes Relative: 24 %
Lymphs Abs: 1.6 10*3/uL (ref 0.7–4.0)
MCH: 32 pg (ref 26.0–34.0)
MCHC: 32.9 g/dL (ref 30.0–36.0)
MCV: 97 fL (ref 80.0–100.0)
Monocytes Absolute: 0.4 10*3/uL (ref 0.1–1.0)
Monocytes Relative: 7 %
Neutro Abs: 4.3 10*3/uL (ref 1.7–7.7)
Neutrophils Relative %: 67 %
Platelet Count: 281 10*3/uL (ref 150–400)
RBC: 3.38 MIL/uL — ABNORMAL LOW (ref 3.87–5.11)
RDW: 13 % (ref 11.5–15.5)
WBC Count: 6.4 10*3/uL (ref 4.0–10.5)
nRBC: 0 % (ref 0.0–0.2)

## 2022-12-01 LAB — MAGNESIUM: Magnesium: 1.8 mg/dL (ref 1.7–2.4)

## 2022-12-01 MED ORDER — SCOPOLAMINE 1 MG/3DAYS TD PT72: 1.0000 | MEDICATED_PATCH | TRANSDERMAL | 12 refills | Status: DC

## 2022-12-01 MED ORDER — ATORVASTATIN CALCIUM 40 MG PO TABS
40.0000 mg | ORAL_TABLET | Freq: Every day | ORAL | 3 refills | Status: DC
Start: 2022-12-01 — End: 2023-03-07

## 2022-12-01 MED ORDER — TRAMETINIB DIMETHYL SULFOXIDE 2 MG PO TABS
2.0000 mg | ORAL_TABLET | Freq: Every day | ORAL | 2 refills | Status: DC
Start: 2022-12-01 — End: 2023-01-06
  Filled 2022-12-01 – 2022-12-20 (×2): qty 30, 30d supply, fill #0
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
## 2022-12-01 NOTE — Patient Instructions (Addendum)
I had a long d/w patient and her husband using Spanish language interpreter about her recent TIA, silent small cerebral infarct, risk for recurrent stroke/TIAs, personally independently reviewed imaging studies and stroke evaluation results and answered questions.Continue aspirin 81 mg daily  for secondary stroke prevention and maintain strict control of hypertension with blood pressure goal below 130/90, diabetes with hemoglobin A1c goal below 6.5% and lipids with LDL cholesterol goal below 70 mg/dL. I also advised the patient to eat a healthy diet with plenty of whole grains, cereals, fruits and vegetables, exercise regularly and maintain ideal body weight.  Recommend she start taking atorvastatin 40 mg daily for her mild hyperlipidemia.  Followup in the future with me in 6 months or call earlier if necessary.  Stroke Prevention Some medical conditions and behaviors can lead to a higher chance of having a stroke. You can help prevent a stroke by eating healthy, exercising, not smoking, and managing any medical conditions you have. Stroke is a leading cause of functional impairment. Primary prevention is particularly important because a majority of strokes are first-time events. Stroke changes the lives of not only those who experience a stroke but also their family and other caregivers. How can this condition affect me? A stroke is a medical emergency and should be treated right away. A stroke can lead to brain damage and can sometimes be life-threatening. If a person gets medical treatment right away, there is a better chance of surviving and recovering from a stroke. What can increase my risk? The following medical conditions may increase your risk of a stroke: Cardiovascular disease. High blood pressure (hypertension). Diabetes. High cholesterol. Sickle cell disease. Blood clotting disorders (hypercoagulable state). Obesity. Sleep disorders (obstructive sleep apnea). Other risk factors  include: Being older than age 60. Having a history of blood clots, stroke, or mini-stroke (transient ischemic attack, TIA). Genetic factors, such as race, ethnicity, or a family history of stroke. Smoking cigarettes or using other tobacco products. Taking birth control pills, especially if you also use tobacco. Heavy use of alcohol or drugs, especially cocaine and methamphetamine. Physical inactivity. What actions can I take to prevent this? Manage your health conditions High cholesterol levels. Eating a healthy diet is important for preventing high cholesterol. If cholesterol cannot be managed through diet alone, you may need to take medicines. Take any prescribed medicines to control your cholesterol as told by your health care provider. Hypertension. To reduce your risk of stroke, try to keep your blood pressure below 130/80. Eating a healthy diet and exercising regularly are important for controlling blood pressure. If these steps are not enough to manage your blood pressure, you may need to take medicines. Take any prescribed medicines to control hypertension as told by your health care provider. Ask your health care provider if you should monitor your blood pressure at home. Have your blood pressure checked every year, even if your blood pressure is normal. Blood pressure increases with age and some medical conditions. Diabetes. Eating a healthy diet and exercising regularly are important parts of managing your blood sugar (glucose). If your blood sugar cannot be managed through diet and exercise, you may need to take medicines. Take any prescribed medicines to control your diabetes as told by your health care provider. Get evaluated for obstructive sleep apnea. Talk to your health care provider about getting a sleep evaluation if you snore a lot or have excessive sleepiness. Make sure that any other medical conditions you have, such as atrial fibrillation or atherosclerosis, are  managed. Nutrition Follow instructions from your health care provider about what to eat or drink to help manage your health condition. These instructions may include: Reducing your daily calorie intake. Limiting how much salt (sodium) you use to 1,500 milligrams (mg) each day. Using only healthy fats for cooking, such as olive oil, canola oil, or sunflower oil. Eating healthy foods. You can do this by: Choosing foods that are high in fiber, such as whole grains, and fresh fruits and vegetables. Eating at least 5 servings of fruits and vegetables a day. Try to fill one-half of your plate with fruits and vegetables at each meal. Choosing lean protein foods, such as lean cuts of meat, poultry without skin, fish, tofu, beans, and nuts. Eating low-fat dairy products. Avoiding foods that are high in sodium. This can help lower blood pressure. Avoiding foods that have saturated fat, trans fat, and cholesterol. This can help prevent high cholesterol. Avoiding processed and prepared foods. Counting your daily carbohydrate intake.  Lifestyle If you drink alcohol: Limit how much you have to: 0-1 drink a day for women who are not pregnant. 0-2 drinks a day for men. Know how much alcohol is in your drink. In the [place] one drink equals one 12 oz bottle of beer (3108mL), one 5 oz glass of wine (14mL), or one 1 oz glass of hard liquor (22mL). Do not use any products that contain nicotine or tobacco. These products include cigarettes, chewing tobacco, and vaping devices, such as e-cigarettes. If you need help quitting, ask your health care provider. Avoid secondhand smoke. Do not use drugs. Activity  Try to stay at a healthy weight. Get at least 30 minutes of exercise on most days, such as: Fast walking. Biking. Swimming. Medicines Take over-the-counter and prescription medicines only as told by your health care provider. Aspirin or blood thinners (antiplatelets or anticoagulants) may be recommended  to reduce your risk of forming blood clots that can lead to stroke. Avoid taking birth control pills. Talk to your health care provider about the risks of taking birth control pills if: You are over 7 years old. You smoke. You get very bad headaches. You have had a blood clot. Where to find more information American Stroke Association: www.strokeassociation.org Get help right away if: You or a loved one has any symptoms of a stroke. "BE FAST" is an easy way to remember the main warning signs of a stroke: B - Balance. Signs are dizziness, sudden trouble walking, or loss of balance. E - Eyes. Signs are trouble seeing or a sudden change in vision. F - Face. Signs are sudden weakness or numbness of the face, or the face or eyelid drooping on one side. A - Arms. Signs are weakness or numbness in an arm. This happens suddenly and usually on one side of the body. S - Speech. Signs are sudden trouble speaking, slurred speech, or trouble understanding what people say. T - Time. Time to call emergency services. Write down what time symptoms started. You or a loved one has other signs of a stroke, such as: A sudden, severe headache with no known cause. Nausea or vomiting. Seizure. These symptoms may represent a serious problem that is an emergency. Do not wait to see if the symptoms will go away. Get medical help right away. Call your local emergency services (911 in the [place] Do not drive yourself to the hospital. Summary You can help to prevent a stroke by eating healthy, exercising, not smoking, limiting alcohol intake, and managing  any medical conditions you may have. Do not use any products that contain nicotine or tobacco. These include cigarettes, chewing tobacco, and vaping devices, such as e-cigarettes. If you need help quitting, ask your health care provider. Remember "BE FAST" for warning signs of a stroke. Get help right away if you or a loved one has any of these signs. This information  is not intended to replace advice given to you by your health care provider. Make sure you discuss any questions you have with your health care provider. Document Revised: 05/10/2022 Document Reviewed: 05/10/2022 Elsevier Patient Education  2024 [employer].

## 2022-12-02 ENCOUNTER — Other Ambulatory Visit: Payer: Self-pay

## 2022-12-02 ENCOUNTER — Telehealth: Payer: Self-pay | Admitting: Hematology

## 2022-12-02 ENCOUNTER — Other Ambulatory Visit (HOSPITAL_COMMUNITY): Payer: Self-pay

## 2022-12-02 MED ORDER — CLONAZEPAM 1 MG PO TABS: 1.0000 mg | ORAL_TABLET | Freq: Two times a day (BID) | ORAL | 0 refills | Status: DC

## 2022-12-03 ENCOUNTER — Other Ambulatory Visit (HOSPITAL_COMMUNITY): Payer: Self-pay

## 2022-12-06 ENCOUNTER — Ambulatory Visit (HOSPITAL_COMMUNITY)
Admission: RE | Admit: 2022-12-06 | Discharge: 2022-12-06 | Disposition: A | Discharge status: Home / Self Care | Payer: Commercial Managed Care - HMO | Source: Ambulatory Visit | Attending: Hematology | Admitting: Hematology

## 2022-12-06 ENCOUNTER — Other Ambulatory Visit: Payer: Self-pay

## 2022-12-06 ENCOUNTER — Other Ambulatory Visit (HOSPITAL_COMMUNITY): Payer: Self-pay

## 2022-12-06 DIAGNOSIS — C3490 Malignant neoplasm of unspecified part of unspecified bronchus or lung: Secondary | ICD-10-CM | POA: Diagnosis present

## 2022-12-06 MED ORDER — GADOBUTROL 1 MMOL/ML IV SOLN
4.0000 mL | Freq: Once | INTRAVENOUS | Status: AC | PRN
  Administered 2022-12-06: 4 mL via INTRAVENOUS

## 2022-12-10 ENCOUNTER — Other Ambulatory Visit (HOSPITAL_COMMUNITY): Payer: Self-pay
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
## 2022-12-13 ENCOUNTER — Other Ambulatory Visit: Payer: Self-pay

## 2022-12-13 ENCOUNTER — Encounter: Payer: Self-pay | Admitting: Student

## 2022-12-13 ENCOUNTER — Ambulatory Visit (INDEPENDENT_AMBULATORY_CARE_PROVIDER_SITE_OTHER): Payer: Commercial Managed Care - HMO | Admitting: Student

## 2022-12-13 VITALS — BP 104/53 | HR 87 | Temp 98.2°F | Ht 62.0 in | Wt 86.0 lb

## 2022-12-13 DIAGNOSIS — R112 Nausea with vomiting, unspecified: Secondary | ICD-10-CM | POA: Diagnosis not present

## 2022-12-13 DIAGNOSIS — H539 Unspecified visual disturbance: Secondary | ICD-10-CM

## 2022-12-13 DIAGNOSIS — G43009 Migraine without aura, not intractable, without status migrainosus: Secondary | ICD-10-CM

## 2022-12-13 NOTE — Assessment & Plan Note (Signed)
Patient reporting vision changes for the past 4 months, mainly impacting her left eye. She states that she began having gradually worsening blurry vision in her left and it has gotten to the point where she is unable to see accurately in that eye. She is able to see blurry images but cannot distinguish between objects if right eye is covered. Over the past 2 weeks, she has noticed similar (but a lot more mild) changes in the right eye. Denies any floaters. Unclear as to why she did not have this evaluated earlier but she reports a lot of ongoing stress with cancer diagnosis and treatment for it.   On exam, her visual acuity is grossly diminished in the left eye. She is unable to count fingers from 5 ft away, just notes blurry image. On visual field testing closer to patient, she is able to detect motion in left upper and lower temporal quadrants but only able to detect light in upper and lower nasal quadrants. Visual field testing on right side is without any abnormalities. Her pupils are equal, round, and reactive to light. No corneal abrasions noted and no hemorrhage noted.  Unclear as to etiology of progressive vision loss. She is on trametinib, which has a <10% risk for blurred vision, <1% risk for retinal vein occlusion, and unclear frequency for retinal detachment. Given inability to perform in-depth fundoscopic exam, will urgently refer her to ophthalmology for further evaluation.  Plan: -referral to ophthalmology

## 2022-12-13 NOTE — Patient Instructions (Addendum)
[name],  It was a pleasure seeing you in the clinic today.   I have placed an urgent referral to the eye doctor for your vision changes. They will call you to schedule this appointment. Please also discuss these changes with Dr. Candise Che. Please follow up in 1 month for your next visit.  Please call our clinic at [phone] if you have any questions or concerns. The best time to call is Monday-Friday from 9am-4pm, but there is someone available 24/7 at the same number. If you need medication refills, please notify your pharmacy one week in advance and they will send [place] a request.   Thank you for letting us take part in your care. We look forward to seeing you next time!

## 2022-12-13 NOTE — Assessment & Plan Note (Signed)
She reports improvement in her nausea and vomiting, has not experienced either for the past 3 days. She states that her nausea does respond well to zofran. Last EKG 1 month ago with QTC 450. Discussed that it would be prudent to check another EKG prior to additional refills if needed in the future. She understands.

## 2022-12-13 NOTE — Assessment & Plan Note (Signed)
She reports daily headaches that have begun over the past week. These headaches occur in the morning, during the daytime, or at nighttime without a set pattern. She does experience headaches that wake her up at night. She states that they are all around her head, unable to localize to one particular area. No associated aura. States that ibuprofen does take the edge off but does not eliminate the headache. She has a history of migraines and was previously on sumatriptan, but was told to stop this due to concern for interaction with chemotherapy regimen. I have asked her to discuss with her oncologist, Dr. Candise Che, who she will see in 2 days on if a triptan is okay to use with current chemotherapy regimen.   Also on the differential for headache would be possible medication withdrawal/overuse headache. She is on fentanyl patch for pain management regularly along with antiemetics. Will await further discussion with Dr. Candise Che prior to deciding therapy.
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
## 2022-12-15 ENCOUNTER — Other Ambulatory Visit: Payer: Self-pay

## 2022-12-15 ENCOUNTER — Inpatient Hospital Stay (HOSPITAL_BASED_OUTPATIENT_CLINIC_OR_DEPARTMENT_OTHER): Payer: Commercial Managed Care - HMO | Admitting: Nurse Practitioner

## 2022-12-15 ENCOUNTER — Other Ambulatory Visit (HOSPITAL_COMMUNITY): Payer: Self-pay

## 2022-12-15 ENCOUNTER — Encounter: Payer: Self-pay | Admitting: Hematology

## 2022-12-15 ENCOUNTER — Encounter: Payer: Self-pay | Admitting: Nurse Practitioner

## 2022-12-15 VITALS — BP 111/69 | HR 92 | Temp 98.0°F | Resp 16 | Wt 85.7 lb

## 2022-12-15 DIAGNOSIS — F32A Depression, unspecified: Secondary | ICD-10-CM

## 2022-12-15 DIAGNOSIS — R21 Rash and other nonspecific skin eruption: Secondary | ICD-10-CM

## 2022-12-15 DIAGNOSIS — C3490 Malignant neoplasm of unspecified part of unspecified bronchus or lung: Secondary | ICD-10-CM | POA: Diagnosis not present

## 2022-12-15 DIAGNOSIS — G893 Neoplasm related pain (acute) (chronic): Secondary | ICD-10-CM | POA: Diagnosis not present

## 2022-12-15 DIAGNOSIS — Z515 Encounter for palliative care: Secondary | ICD-10-CM | POA: Diagnosis not present

## 2022-12-15 DIAGNOSIS — K5903 Drug induced constipation: Secondary | ICD-10-CM

## 2022-12-15 DIAGNOSIS — R5383 Other fatigue: Secondary | ICD-10-CM

## 2022-12-15 DIAGNOSIS — R630 Anorexia: Secondary | ICD-10-CM

## 2022-12-15 MED ORDER — PROCHLORPERAZINE MALEATE 10 MG PO TABS
10.0000 mg | ORAL_TABLET | Freq: Four times a day (QID) | ORAL | 1 refills | Status: DC | PRN
Start: 2022-12-15 — End: 2023-01-27
  Filled 2022-12-15: qty 30, 8d supply, fill #0
  Filled 2023-01-05: qty 30, 8d supply, fill #1

## 2022-12-15 MED ORDER — ONDANSETRON 8 MG PO TBDP
8.0000 mg | ORAL_TABLET | Freq: Three times a day (TID) | ORAL | 1 refills | Status: DC | PRN
Start: 2022-12-15 — End: 2023-01-27
  Filled 2022-12-15: qty 30, 10d supply, fill #0
  Filled 2023-01-05: qty 30, 10d supply, fill #1

## 2022-12-15 MED ORDER — EUCERIN ORIGINAL HEALING EX CREA
1.0000 | TOPICAL_CREAM | Freq: Two times a day (BID) | CUTANEOUS | 0 refills | Status: DC | PRN
Start: 2022-12-15 — End: 2022-12-31
  Filled 2022-12-15: qty 454, 30d supply, fill #0

## 2022-12-15 MED ORDER — PANTOPRAZOLE SODIUM 40 MG PO TBEC
40.0000 mg | DELAYED_RELEASE_TABLET | Freq: Two times a day (BID) | ORAL | 1 refills | Status: DC
  Filled 2022-12-15: qty 60, 30d supply, fill #0

## 2022-12-15 MED ORDER — SUMATRIPTAN SUCCINATE 50 MG PO TABS
50.0000 mg | ORAL_TABLET | ORAL | 1 refills | Status: DC
  Filled 2022-12-15: qty 9, 30d supply, fill #0
  Filled 2023-01-10: qty 9, 30d supply, fill #1
  Filled 2023-01-27: qty 9, 30d supply, fill #2

## 2022-12-15 MED ORDER — FENTANYL 25 MCG/HR TD PT72
1.0000 | MEDICATED_PATCH | TRANSDERMAL | 0 refills | Status: DC
Start: 2022-12-15 — End: 2023-01-27
  Filled 2022-12-15: qty 10, 30d supply, fill #0

## 2022-12-16 ENCOUNTER — Encounter: Payer: Self-pay | Admitting: Hematology

## 2022-12-16 ENCOUNTER — Other Ambulatory Visit (HOSPITAL_COMMUNITY): Payer: Self-pay

## 2022-12-20 ENCOUNTER — Other Ambulatory Visit: Payer: Self-pay

## 2022-12-20 ENCOUNTER — Other Ambulatory Visit (HOSPITAL_COMMUNITY): Payer: Self-pay

## 2022-12-21 ENCOUNTER — Other Ambulatory Visit (HOSPITAL_COMMUNITY): Payer: Self-pay

## 2022-12-22 ENCOUNTER — Other Ambulatory Visit: Payer: Self-pay

## 2022-12-22 ENCOUNTER — Other Ambulatory Visit (HOSPITAL_COMMUNITY): Payer: Self-pay

## 2022-12-23 NOTE — Progress Notes (Signed)
Internal Medicine Clinic Attending  Case discussed with Dr. Jinwala at the time of the visit.  We reviewed the resident's history and exam and pertinent patient test results.  I agree with the assessment, diagnosis, and plan of care documented in the resident's note.  

## 2022-12-24 ENCOUNTER — Other Ambulatory Visit (HOSPITAL_COMMUNITY): Payer: Self-pay

## 2022-12-28 ENCOUNTER — Encounter: Payer: Self-pay | Admitting: Hematology
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
## 2022-12-30 ENCOUNTER — Other Ambulatory Visit: Payer: Self-pay

## 2022-12-30 DIAGNOSIS — C3490 Malignant neoplasm of unspecified part of unspecified bronchus or lung: Secondary | ICD-10-CM

## 2022-12-31 ENCOUNTER — Encounter: Payer: Self-pay | Admitting: Nurse Practitioner

## 2022-12-31 ENCOUNTER — Inpatient Hospital Stay: Payer: Commercial Managed Care - HMO | Attending: Hematology

## 2022-12-31 ENCOUNTER — Inpatient Hospital Stay (HOSPITAL_BASED_OUTPATIENT_CLINIC_OR_DEPARTMENT_OTHER): Payer: Commercial Managed Care - HMO | Admitting: Nurse Practitioner

## 2022-12-31 ENCOUNTER — Other Ambulatory Visit: Payer: Self-pay

## 2022-12-31 ENCOUNTER — Inpatient Hospital Stay (HOSPITAL_BASED_OUTPATIENT_CLINIC_OR_DEPARTMENT_OTHER): Payer: Commercial Managed Care - HMO | Admitting: Hematology

## 2022-12-31 ENCOUNTER — Other Ambulatory Visit (HOSPITAL_COMMUNITY): Payer: Self-pay

## 2022-12-31 VITALS — BP 100/65 | HR 83 | Temp 97.5°F | Resp 18 | Wt 87.8 lb

## 2022-12-31 DIAGNOSIS — C3490 Malignant neoplasm of unspecified part of unspecified bronchus or lung: Secondary | ICD-10-CM | POA: Diagnosis present

## 2022-12-31 DIAGNOSIS — Z8711 Personal history of peptic ulcer disease: Secondary | ICD-10-CM | POA: Diagnosis not present

## 2022-12-31 DIAGNOSIS — Z8611 Personal history of tuberculosis: Secondary | ICD-10-CM | POA: Diagnosis not present

## 2022-12-31 DIAGNOSIS — Z515 Encounter for palliative care: Secondary | ICD-10-CM

## 2022-12-31 DIAGNOSIS — M545 Low back pain, unspecified: Secondary | ICD-10-CM | POA: Insufficient documentation

## 2022-12-31 DIAGNOSIS — R1312 Dysphagia, oropharyngeal phase: Secondary | ICD-10-CM

## 2022-12-31 DIAGNOSIS — K589 Irritable bowel syndrome without diarrhea: Secondary | ICD-10-CM | POA: Insufficient documentation

## 2022-12-31 DIAGNOSIS — Z7982 Long term (current) use of aspirin: Secondary | ICD-10-CM | POA: Diagnosis not present

## 2022-12-31 DIAGNOSIS — I10 Essential (primary) hypertension: Secondary | ICD-10-CM | POA: Diagnosis not present

## 2022-12-31 DIAGNOSIS — R21 Rash and other nonspecific skin eruption: Secondary | ICD-10-CM

## 2022-12-31 DIAGNOSIS — R1013 Epigastric pain: Secondary | ICD-10-CM

## 2022-12-31 DIAGNOSIS — G893 Neoplasm related pain (acute) (chronic): Secondary | ICD-10-CM

## 2022-12-31 DIAGNOSIS — Z79899 Other long term (current) drug therapy: Secondary | ICD-10-CM | POA: Insufficient documentation

## 2022-12-31 DIAGNOSIS — R530 Neoplastic (malignant) related fatigue: Secondary | ICD-10-CM

## 2022-12-31 DIAGNOSIS — C7951 Secondary malignant neoplasm of bone: Secondary | ICD-10-CM | POA: Insufficient documentation

## 2022-12-31 DIAGNOSIS — E876 Hypokalemia: Secondary | ICD-10-CM

## 2022-12-31 DIAGNOSIS — Z7952 Long term (current) use of systemic steroids: Secondary | ICD-10-CM | POA: Insufficient documentation

## 2022-12-31 DIAGNOSIS — K5900 Constipation, unspecified: Secondary | ICD-10-CM

## 2022-12-31 LAB — CBC WITH DIFFERENTIAL (CANCER CENTER ONLY)
Abs Immature Granulocytes: 0.01 10*3/uL (ref 0.00–0.07)
Basophils Absolute: 0 10*3/uL (ref 0.0–0.1)
Basophils Relative: 1 %
Eosinophils Absolute: 0 10*3/uL (ref 0.0–0.5)
Eosinophils Relative: 1 %
HCT: 33 % — ABNORMAL LOW (ref 36.0–46.0)
Hemoglobin: 11.2 g/dL — ABNORMAL LOW (ref 12.0–15.0)
Immature Granulocytes: 0 %
Lymphocytes Relative: 34 %
Lymphs Abs: 1.8 10*3/uL (ref 0.7–4.0)
MCH: 32.4 pg (ref 26.0–34.0)
MCHC: 33.9 g/dL (ref 30.0–36.0)
MCV: 95.4 fL (ref 80.0–100.0)
Monocytes Absolute: 0.4 10*3/uL (ref 0.1–1.0)
Monocytes Relative: 7 %
Neutro Abs: 2.9 10*3/uL (ref 1.7–7.7)
Neutrophils Relative %: 57 %
Platelet Count: 313 10*3/uL (ref 150–400)
RBC: 3.46 MIL/uL — ABNORMAL LOW (ref 3.87–5.11)
RDW: 12.3 % (ref 11.5–15.5)
WBC Count: 5.1 10*3/uL (ref 4.0–10.5)
nRBC: 0 % (ref 0.0–0.2)

## 2022-12-31 LAB — CMP (CANCER CENTER ONLY)
ALT: 12 U/L (ref 0–44)
AST: 21 U/L (ref 15–41)
Albumin: 3.8 g/dL (ref 3.5–5.0)
Alkaline Phosphatase: 65 U/L (ref 38–126)
Anion gap: 7 (ref 5–15)
BUN: 11 mg/dL (ref 6–20)
CO2: 26 mmol/L (ref 22–32)
Calcium: 9.4 mg/dL (ref 8.9–10.3)
Chloride: 102 mmol/L (ref 98–111)
Creatinine: 0.77 mg/dL (ref 0.44–1.00)
GFR, Estimated: >60 mL/min (ref >60)
Glucose, Bld: 116 mg/dL — ABNORMAL HIGH (ref 70–99)
Potassium: 3.7 mmol/L (ref 3.5–5.1)
Sodium: 135 mmol/L (ref 135–145)
Total Bilirubin: 0.2 mg/dL — ABNORMAL LOW (ref 0.3–1.2)
Total Protein: 6.5 g/dL (ref 6.5–8.1)

## 2022-12-31 LAB — MAGNESIUM: Magnesium: 1.8 mg/dL (ref 1.7–2.4)

## 2022-12-31 MED ORDER — POTASSIUM CHLORIDE CRYS ER 20 MEQ PO TBCR
20.0000 meq | EXTENDED_RELEASE_TABLET | Freq: Every day | ORAL | 2 refills | Status: DC
Start: 2022-12-31 — End: 2023-01-28

## 2022-12-31 MED ORDER — TRIAMCINOLONE ACETONIDE 0.1 % EX CREA
1.0000 | TOPICAL_CREAM | Freq: Two times a day (BID) | CUTANEOUS | 2 refills | Status: DC | PRN
Start: 2022-12-31 — End: 2023-03-07

## 2022-12-31 MED ORDER — PANTOPRAZOLE SODIUM 40 MG PO TBEC
40.0000 mg | DELAYED_RELEASE_TABLET | Freq: Two times a day (BID) | ORAL | 3 refills | Status: DC
Start: 2022-12-31 — End: 2023-09-07

## 2022-12-31 MED ORDER — SENNOSIDES-DOCUSATE SODIUM 8.6-50 MG PO TABS
1.0000 | ORAL_TABLET | Freq: Two times a day (BID) | ORAL | 3 refills | Status: DC
Start: 2022-12-31 — End: 2023-04-05
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
## 2023-01-01 ENCOUNTER — Other Ambulatory Visit (HOSPITAL_COMMUNITY): Payer: Self-pay

## 2023-01-03 ENCOUNTER — Other Ambulatory Visit: Payer: Self-pay

## 2023-01-03 ENCOUNTER — Telehealth: Payer: Self-pay | Admitting: Hematology

## 2023-01-03 ENCOUNTER — Other Ambulatory Visit: Payer: Self-pay | Admitting: Student

## 2023-01-03 MED ORDER — CLONAZEPAM 1 MG PO TABS: 1.0000 mg | ORAL_TABLET | Freq: Two times a day (BID) | ORAL | 0 refills | Status: DC

## 2023-01-03 NOTE — Telephone Encounter (Signed)
Patient is aware of upcoming appointment times/dates.  

## 2023-01-03 NOTE — Progress Notes (Signed)
Patient requested a refill of her Clonazepam. PDMP reviewed and deemed appropriate for a month refill.

## 2023-01-06 ENCOUNTER — Encounter: Payer: Self-pay | Admitting: Hematology

## 2023-01-06 MED ORDER — TRAMETINIB DIMETHYL SULFOXIDE 2 MG PO TABS
2.0000 mg | ORAL_TABLET | Freq: Every day | ORAL | 2 refills | Status: DC
Start: 2023-01-06 — End: 2023-04-20
  Filled 2023-01-06 – 2023-01-13 (×3): qty 30, 30d supply, fill #0
  Filled 2023-02-11: qty 30, 30d supply, fill #1
  Filled 2023-03-07: qty 30, 30d supply, fill #2

## 2023-01-06 MED ORDER — DABRAFENIB MESYLATE 75 MG PO CAPS
150.0000 mg | ORAL_CAPSULE | Freq: Two times a day (BID) | ORAL | 2 refills | Status: DC
  Filled 2023-01-06 – 2023-01-13 (×2): qty 120, 30d supply, fill #0
  Filled 2023-02-11: qty 120, 30d supply, fill #1
  Filled 2023-03-07: qty 120, 30d supply, fill #2

## 2023-01-07 ENCOUNTER — Other Ambulatory Visit (HOSPITAL_COMMUNITY): Payer: Self-pay

## 2023-01-07 ENCOUNTER — Other Ambulatory Visit: Payer: Self-pay

## 2023-01-10 ENCOUNTER — Other Ambulatory Visit (HOSPITAL_COMMUNITY): Payer: Self-pay | Admitting: *Deleted

## 2023-01-10 DIAGNOSIS — R1310 Dysphagia, unspecified: Secondary | ICD-10-CM

## 2023-01-11 ENCOUNTER — Other Ambulatory Visit (HOSPITAL_COMMUNITY): Payer: Self-pay

## 2023-01-13 ENCOUNTER — Other Ambulatory Visit (HOSPITAL_COMMUNITY): Payer: Self-pay

## 2023-01-17 ENCOUNTER — Other Ambulatory Visit: Payer: Self-pay
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
## 2023-01-18 ENCOUNTER — Ambulatory Visit (HOSPITAL_COMMUNITY)
Admission: RE | Admit: 2023-01-18 | Discharge: 2023-01-18 | Disposition: A | Discharge status: Home / Self Care | Payer: Commercial Managed Care - HMO | Source: Ambulatory Visit | Attending: Hematology | Admitting: Hematology

## 2023-01-18 ENCOUNTER — Telehealth: Payer: Self-pay

## 2023-01-18 ENCOUNTER — Ambulatory Visit (HOSPITAL_COMMUNITY)
Admission: RE | Admit: 2023-01-18 | Discharge: 2023-01-18 | Disposition: A | Discharge status: Home / Self Care | Payer: Commercial Managed Care - HMO | Source: Ambulatory Visit

## 2023-01-18 DIAGNOSIS — R1312 Dysphagia, oropharyngeal phase: Secondary | ICD-10-CM | POA: Diagnosis present

## 2023-01-18 DIAGNOSIS — R1310 Dysphagia, unspecified: Secondary | ICD-10-CM

## 2023-01-18 NOTE — Telephone Encounter (Signed)
Patient has been scheduled. Aware of appt date and time.    Scheduling Message Entered by Ok Edwards P on 01/14/2023 at  7:52 AM Priority: Routine PALLIATIVE CARE    Department: [facility]  Provider:  Scheduling Notes:  Hi please call and schedule this pt for a 30 min appt in the next 2 weeks, thank you

## 2023-01-18 NOTE — Therapy (Signed)
Modified Barium Swallow Study  Patient Details  Name: Karen [name] MRN: [id number] Date of Birth: 1966/03/21  Today's Date: 01/18/2023  Modified Barium Swallow completed.  Full report located under Chart Review in the Imaging Section.  History of Present Illness Karen [name] is a 57 y.o. female with oncologic medical history including newly diagnosed metastatic lung adenocarcinoma (07/2022), HTN, migraines, IBS, peptic ulcer disease, and depression. MRI brain, 12/14/22, "No evidence of metastatic disease. Punctate focus of cortical  restricted diffusion in the left parietal region seen in February  has resolved and is most consistent with an old small vessel  infarction. Mild chronic small-vessel ischemic change elsewhere of  the cerebral hemispheric white matter."  CT abdomen pelvis, 10/31/22, "1. Nonspecific hydropic gallbladder.  2. 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 "moderate pharyngeal dysphagia with low risk of aspiration." Recommended regular and thin with safe swallowing strategies.   Clinical Impression Pt presents with a mild-moderate pharyngeal dysphagia with limited risk of aspiration from a prandial source. Limited change in pharyngeal swallow function compared to MBSS in January 2024. Pt with swallow initiation at the level of the vallecula or pyrifrom sinuses across all textures. Trace pharyngeal stasis appreciated which cleared with spontaneous cleansing swallows. Trace nasopharyngeal regurgitation noted occasionally. No instances of penetration or aspiration noted. Piecemeal swallow appreciated; ?habitual. Concern for pharyngoesophageal dysphagia given esophageal retention and backflow below the level of the PES. Retention appeared to correlate with pt's s/sx of coughing/throat clearing. Pt may benefit from GI consult for further evaluation. Recommend continuation of a regular diet with thin  liquids with safe swallowing stategies/aspiration precautions and reflux precautions as outlined below. Pt educated re: results, recommendations, and SLP POC. Pt shown videofluoroscopic images for reinforcement of content. Interpreter present for duration of evaluation and education. Factors that may increase risk of adverse event in presence of aspiration Karen [name] 2021): Poor general health and/or compromised immunity;Respiratory or GI disease;Frail or deconditioned  Swallow Evaluation Recommendations Recommendations: PO diet PO Diet Recommendation: Regular;Thin liquids (Level 0) Liquid Administration via: Spoon;Cup;Straw Medication Administration: Other (Comment) (as tolerated) Supervision: Patient able to self-feed Swallowing strategies  : Slow rate;Small bites/sips Postural changes: Position pt fully upright for meals (Upright for 60-90 minutes after meals) Oral care recommendations: Oral care BID (2x/day) Recommended consults: Consider GI consultation    Karen [name], M.S., CCC-SLP Speech-Language Pathologist Secure Chat Preferred  O: [phone]   Karen [name] 01/18/2023,12:11 PM

## 2023-01-19 ENCOUNTER — Encounter (INDEPENDENT_AMBULATORY_CARE_PROVIDER_SITE_OTHER): Payer: Self-pay | Admitting: Ophthalmology

## 2023-01-19 ENCOUNTER — Ambulatory Visit (INDEPENDENT_AMBULATORY_CARE_PROVIDER_SITE_OTHER): Payer: Commercial Managed Care - HMO | Admitting: Ophthalmology

## 2023-01-19 DIAGNOSIS — C801 Malignant (primary) neoplasm, unspecified: Secondary | ICD-10-CM

## 2023-01-19 DIAGNOSIS — H35033 Hypertensive retinopathy, bilateral: Secondary | ICD-10-CM | POA: Diagnosis not present

## 2023-01-19 DIAGNOSIS — H3323 Serous retinal detachment, bilateral: Secondary | ICD-10-CM | POA: Diagnosis not present

## 2023-01-19 DIAGNOSIS — H3581 Retinal edema: Secondary | ICD-10-CM

## 2023-01-19 DIAGNOSIS — C419 Malignant neoplasm of bone and articular cartilage, unspecified: Secondary | ICD-10-CM

## 2023-01-19 DIAGNOSIS — I10 Essential (primary) hypertension: Secondary | ICD-10-CM | POA: Diagnosis not present

## 2023-01-19 DIAGNOSIS — H04123 Dry eye syndrome of bilateral lacrimal glands: Secondary | ICD-10-CM

## 2023-01-19 DIAGNOSIS — C3490 Malignant neoplasm of unspecified part of unspecified bronchus or lung: Secondary | ICD-10-CM

## 2023-01-20 ENCOUNTER — Encounter (INDEPENDENT_AMBULATORY_CARE_PROVIDER_SITE_OTHER): Payer: Self-pay | Admitting: Ophthalmology

## 2023-01-22 ENCOUNTER — Other Ambulatory Visit (HOSPITAL_COMMUNITY): Payer: Self-pay

## 2023-01-27 ENCOUNTER — Other Ambulatory Visit: Payer: Self-pay | Admitting: Hematology

## 2023-01-27 ENCOUNTER — Other Ambulatory Visit (HOSPITAL_COMMUNITY): Payer: Self-pay

## 2023-01-27 ENCOUNTER — Other Ambulatory Visit: Payer: Self-pay | Admitting: Nurse Practitioner

## 2023-01-27 DIAGNOSIS — G893 Neoplasm related pain (acute) (chronic): Secondary | ICD-10-CM

## 2023-01-27 DIAGNOSIS — Z515 Encounter for palliative care: Secondary | ICD-10-CM

## 2023-01-27 DIAGNOSIS — C3490 Malignant neoplasm of unspecified part of unspecified bronchus or lung: Secondary | ICD-10-CM

## 2023-01-28 ENCOUNTER — Other Ambulatory Visit: Payer: Self-pay

## 2023-01-28 ENCOUNTER — Other Ambulatory Visit (HOSPITAL_COMMUNITY): Payer: Self-pay

## 2023-01-28 ENCOUNTER — Other Ambulatory Visit: Payer: Self-pay | Admitting: Nurse Practitioner

## 2023-01-28 ENCOUNTER — Encounter: Payer: Self-pay | Admitting: Hematology

## 2023-01-28 DIAGNOSIS — Z515 Encounter for palliative care: Secondary | ICD-10-CM

## 2023-01-28 DIAGNOSIS — E876 Hypokalemia: Secondary | ICD-10-CM

## 2023-01-28 DIAGNOSIS — C3490 Malignant neoplasm of unspecified part of unspecified bronchus or lung: Secondary | ICD-10-CM

## 2023-01-28 MED ORDER — POTASSIUM CHLORIDE CRYS ER 20 MEQ PO TBCR
20.0000 meq | EXTENDED_RELEASE_TABLET | Freq: Every day | ORAL | 2 refills | Status: DC
Start: 2023-01-28 — End: 2023-04-05

## 2023-01-28 MED ORDER — PROCHLORPERAZINE MALEATE 10 MG PO TABS
10.0000 mg | ORAL_TABLET | Freq: Four times a day (QID) | ORAL | 1 refills | Status: DC | PRN
  Filled 2023-01-28: qty 30, 8d supply, fill #0
  Filled 2023-02-25: qty 30, 8d supply, fill #1

## 2023-01-28 MED ORDER — FENTANYL 25 MCG/HR TD PT72
1.0000 | MEDICATED_PATCH | TRANSDERMAL | 0 refills | Status: DC
Start: 2023-01-28 — End: 2023-03-07
  Filled 2023-01-28: qty 10, 30d supply, fill #0

## 2023-01-28 MED ORDER — DEXAMETHASONE 0.5 MG PO TABS
0.5000 mg | ORAL_TABLET | Freq: Every day | ORAL | 1 refills | Status: DC
  Filled 2023-01-28: qty 30, 30d supply, fill #0
  Filled 2023-02-25: qty 30, 30d supply, fill #1

## 2023-01-28 MED ORDER — ONDANSETRON 8 MG PO TBDP
8.0000 mg | ORAL_TABLET | Freq: Three times a day (TID) | ORAL | 1 refills | Status: DC | PRN
  Filled 2023-01-28: qty 30, 10d supply, fill #0
  Filled 2023-02-25: qty 30, 10d supply, fill #1

## 2023-01-28 MED ORDER — ONDANSETRON HCL 4 MG PO TABS: 4.0000 mg | ORAL_TABLET | Freq: Four times a day (QID) | ORAL | 0 refills | Status: DC

## 2023-01-28 MED ORDER — SUMATRIPTAN SUCCINATE 50 MG PO TABS
50.0000 mg | ORAL_TABLET | ORAL | 1 refills | Status: DC
  Filled 2023-01-28 – 2023-02-25 (×2): qty 10, 34d supply, fill #0
  Filled 2023-03-28: qty 10, 34d supply, fill #1

## 2023-01-28 NOTE — Progress Notes (Signed)
Contacted pt's husband per Dr Candise Che to: have Ms Karen [name] hold he mekinist for now pending additional ophthalmic evaluation at [facility]. Pt's husband verbalized understanding and will hold medication.
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
## 2023-02-01 ENCOUNTER — Other Ambulatory Visit: Payer: Self-pay

## 2023-02-01 ENCOUNTER — Other Ambulatory Visit (HOSPITAL_COMMUNITY): Payer: Self-pay

## 2023-02-01 ENCOUNTER — Encounter: Payer: Self-pay | Admitting: Nurse Practitioner

## 2023-02-01 ENCOUNTER — Inpatient Hospital Stay: Payer: Commercial Managed Care - HMO | Attending: Hematology | Admitting: Nurse Practitioner

## 2023-02-01 VITALS — BP 101/54 | HR 79 | Temp 97.5°F | Resp 16 | Wt 92.3 lb

## 2023-02-01 DIAGNOSIS — C3490 Malignant neoplasm of unspecified part of unspecified bronchus or lung: Secondary | ICD-10-CM | POA: Diagnosis not present

## 2023-02-01 DIAGNOSIS — C419 Malignant neoplasm of bone and articular cartilage, unspecified: Secondary | ICD-10-CM

## 2023-02-01 DIAGNOSIS — R530 Neoplastic (malignant) related fatigue: Secondary | ICD-10-CM

## 2023-02-01 DIAGNOSIS — Z515 Encounter for palliative care: Secondary | ICD-10-CM | POA: Diagnosis not present

## 2023-02-01 DIAGNOSIS — R630 Anorexia: Secondary | ICD-10-CM | POA: Diagnosis not present

## 2023-02-04 ENCOUNTER — Other Ambulatory Visit: Payer: Self-pay

## 2023-02-04 DIAGNOSIS — C3490 Malignant neoplasm of unspecified part of unspecified bronchus or lung: Secondary | ICD-10-CM

## 2023-02-07 ENCOUNTER — Other Ambulatory Visit: Payer: Self-pay

## 2023-02-08 MED ORDER — CLONAZEPAM 1 MG PO TABS: 1.0000 mg | ORAL_TABLET | Freq: Two times a day (BID) | ORAL | 0 refills | Status: DC

## 2023-02-09 ENCOUNTER — Other Ambulatory Visit (HOSPITAL_COMMUNITY): Payer: Self-pay

## 2023-02-09 NOTE — Progress Notes (Incomplete)
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  She reports bilateral blurry vision.   Past/Anticipated interventions by opthalmology, if any: Dr. Haywood Lasso -Bilateral EBRT preferred if no plan to change the chemotherapy, will discuss with medical oncologist Dr. Candise Che.  -If switching to new chemo, can assess response to the new drug and hold off on EBRT.    Past/Anticipated chemotherapy by medical oncology, if any:  Dr. Candise Che -Metastatic lung adenocarcinoma (- BRAF mutated.) Noted to have with extensive mediastinal lymphadenopathy and lower cervical adenopathy bilateral diffuse pulmonary nodularity, right hilar nodal mass. -continue Dabrafenib+ trametinib.      Pain on a scale of 0-10 is:  Patient denies pain   Ambulatory status? Up ab lib with some monitoring Walker? No Wheelchair?: No, patient refuses to use any assisted devices.  Vision: Patient reports having headaches all the time and blurry vision.  SAFETY ISSUES: Prior radiation? No Pacemaker/ICD? No Possible current pregnancy? Hysterectomy Is the patient on methotrexate? No  Additional Complaints / other details:  She was asking about disability and shower chair. The question is that she wants to be educated about her radiation

## 2023-02-10 ENCOUNTER — Ambulatory Visit
Admission: RE | Admit: 2023-02-10 | Discharge: 2023-02-10 | Disposition: A | Discharge status: Home / Self Care | Payer: Commercial Managed Care - HMO | Source: Ambulatory Visit | Attending: Radiation Oncology | Admitting: Radiation Oncology

## 2023-02-10 VITALS — BP 91/65 | HR 90 | Temp 97.5°F | Resp 18 | Ht 62.0 in

## 2023-02-10 DIAGNOSIS — E785 Hyperlipidemia, unspecified: Secondary | ICD-10-CM | POA: Diagnosis not present

## 2023-02-10 DIAGNOSIS — Z5986 Financial insecurity: Secondary | ICD-10-CM | POA: Insufficient documentation

## 2023-02-10 DIAGNOSIS — C6960 Malignant neoplasm of unspecified orbit: Secondary | ICD-10-CM | POA: Insufficient documentation

## 2023-02-10 DIAGNOSIS — Z7982 Long term (current) use of aspirin: Secondary | ICD-10-CM | POA: Diagnosis not present

## 2023-02-10 DIAGNOSIS — I10 Essential (primary) hypertension: Secondary | ICD-10-CM | POA: Diagnosis not present

## 2023-02-10 DIAGNOSIS — Z79899 Other long term (current) drug therapy: Secondary | ICD-10-CM | POA: Diagnosis not present

## 2023-02-10 DIAGNOSIS — K589 Irritable bowel syndrome without diarrhea: Secondary | ICD-10-CM | POA: Diagnosis not present

## 2023-02-10 DIAGNOSIS — C3490 Malignant neoplasm of unspecified part of unspecified bronchus or lung: Secondary | ICD-10-CM | POA: Insufficient documentation

## 2023-02-10 DIAGNOSIS — Z7952 Long term (current) use of systemic steroids: Secondary | ICD-10-CM | POA: Diagnosis not present

## 2023-02-10 DIAGNOSIS — C7949 Secondary malignant neoplasm of other parts of nervous system: Secondary | ICD-10-CM

## 2023-02-10 DIAGNOSIS — Z8711 Personal history of peptic ulcer disease: Secondary | ICD-10-CM | POA: Insufficient documentation
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
## 2023-02-11 ENCOUNTER — Inpatient Hospital Stay: Payer: Commercial Managed Care - HMO | Admitting: Licensed Clinical Social Worker

## 2023-02-11 ENCOUNTER — Other Ambulatory Visit: Payer: Self-pay

## 2023-02-11 ENCOUNTER — Other Ambulatory Visit (HOSPITAL_COMMUNITY): Payer: Self-pay

## 2023-02-11 DIAGNOSIS — C3490 Malignant neoplasm of unspecified part of unspecified bronchus or lung: Secondary | ICD-10-CM

## 2023-02-11 NOTE — Progress Notes (Signed)
[facility] CSW Progress Note  Clinical Child psychotherapist  contacted pt's spouse by phone to answer questions regarding disability for pt.  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  Pt will not qualify for SSD.  Pt may qualify for Medicaid and SSI .  Pt's husband requested CSW email contact details for DSS outstation to him as he was at work at the time of the call.  CSW emailed contact details along w/ contact details for the patient financial resource specialist to apply for the [employer] as pt has not done so yet.  Pt's spouse to contact CSW with any further questions.      Rachel Moulds, LCSW Clinical Social Worker [facility]

## 2023-02-16 ENCOUNTER — Ambulatory Visit: Payer: Commercial Managed Care - HMO | Admitting: Radiation Oncology

## 2023-02-23 ENCOUNTER — Other Ambulatory Visit (HOSPITAL_COMMUNITY): Payer: Self-pay

## 2023-02-24 ENCOUNTER — Ambulatory Visit (HOSPITAL_COMMUNITY): Payer: Commercial Managed Care - HMO

## 2023-02-25 ENCOUNTER — Other Ambulatory Visit: Payer: Self-pay | Admitting: Hematology

## 2023-02-25 ENCOUNTER — Other Ambulatory Visit: Payer: Self-pay

## 2023-02-25 ENCOUNTER — Ambulatory Visit
Admission: RE | Admit: 2023-02-25 | Discharge: 2023-02-25 | Disposition: A | Discharge status: Home / Self Care | Payer: Managed Care, Other (non HMO) | Source: Ambulatory Visit | Attending: Radiation Oncology | Admitting: Radiation Oncology

## 2023-02-25 ENCOUNTER — Other Ambulatory Visit (HOSPITAL_COMMUNITY): Payer: Self-pay

## 2023-02-25 DIAGNOSIS — C7951 Secondary malignant neoplasm of bone: Secondary | ICD-10-CM | POA: Diagnosis not present

## 2023-02-25 DIAGNOSIS — C3490 Malignant neoplasm of unspecified part of unspecified bronchus or lung: Secondary | ICD-10-CM | POA: Diagnosis present

## 2023-02-25 DIAGNOSIS — Z510 Encounter for antineoplastic radiation therapy: Secondary | ICD-10-CM | POA: Diagnosis present

## 2023-02-25 MED ORDER — ONDANSETRON HCL 4 MG PO TABS
4.0000 mg | ORAL_TABLET | Freq: Four times a day (QID) | ORAL | 0 refills | Status: DC
  Filled 2023-02-25: qty 12, 3d supply, fill #0

## 2023-02-26 ENCOUNTER — Other Ambulatory Visit (HOSPITAL_COMMUNITY): Payer: Self-pay

## 2023-03-01 ENCOUNTER — Telehealth: Payer: Self-pay | Admitting: *Deleted

## 2023-03-01 ENCOUNTER — Encounter (INDEPENDENT_AMBULATORY_CARE_PROVIDER_SITE_OTHER): Payer: Commercial Managed Care - HMO | Admitting: Ophthalmology

## 2023-03-01 DIAGNOSIS — C419 Malignant neoplasm of bone and articular cartilage, unspecified: Secondary | ICD-10-CM

## 2023-03-01 DIAGNOSIS — H04123 Dry eye syndrome of bilateral lacrimal glands: Secondary | ICD-10-CM

## 2023-03-01 DIAGNOSIS — H35033 Hypertensive retinopathy, bilateral: Secondary | ICD-10-CM

## 2023-03-01 DIAGNOSIS — I10 Essential (primary) hypertension: Secondary | ICD-10-CM

## 2023-03-01 DIAGNOSIS — C3490 Malignant neoplasm of unspecified part of unspecified bronchus or lung: Secondary | ICD-10-CM

## 2023-03-01 DIAGNOSIS — H3323 Serous retinal detachment, bilateral: Secondary | ICD-10-CM

## 2023-03-01 NOTE — Telephone Encounter (Signed)
Husband states Niyati has severe headache, ears hurting, lost vision completely yesterday and is not eating. Wants to know if they should got to the ED. He will take her to the ED

## 2023-03-03 ENCOUNTER — Ambulatory Visit: Payer: Managed Care, Other (non HMO) | Admitting: Radiation Oncology

## 2023-03-04 ENCOUNTER — Ambulatory Visit: Payer: Managed Care, Other (non HMO)

## 2023-03-07 ENCOUNTER — Other Ambulatory Visit: Payer: Self-pay

## 2023-03-07 ENCOUNTER — Inpatient Hospital Stay (HOSPITAL_BASED_OUTPATIENT_CLINIC_OR_DEPARTMENT_OTHER): Payer: Managed Care, Other (non HMO) | Admitting: Hematology

## 2023-03-07 ENCOUNTER — Encounter: Payer: Self-pay | Admitting: Nurse Practitioner

## 2023-03-07 ENCOUNTER — Inpatient Hospital Stay (HOSPITAL_BASED_OUTPATIENT_CLINIC_OR_DEPARTMENT_OTHER): Payer: Managed Care, Other (non HMO) | Admitting: Nurse Practitioner

## 2023-03-07 ENCOUNTER — Other Ambulatory Visit: Payer: Self-pay | Admitting: Neurology

## 2023-03-07 ENCOUNTER — Ambulatory Visit: Payer: Managed Care, Other (non HMO) | Admitting: Radiation Oncology

## 2023-03-07 ENCOUNTER — Inpatient Hospital Stay: Payer: Managed Care, Other (non HMO) | Attending: Hematology

## 2023-03-07 VITALS — BP 99/63 | HR 98 | Temp 97.5°F | Resp 18 | Wt 92.6 lb

## 2023-03-07 DIAGNOSIS — C7949 Secondary malignant neoplasm of other parts of nervous system: Secondary | ICD-10-CM

## 2023-03-07 DIAGNOSIS — J90 Pleural effusion, not elsewhere classified: Secondary | ICD-10-CM | POA: Insufficient documentation

## 2023-03-07 DIAGNOSIS — Z95828 Presence of other vascular implants and grafts: Secondary | ICD-10-CM

## 2023-03-07 DIAGNOSIS — Z515 Encounter for palliative care: Secondary | ICD-10-CM | POA: Diagnosis not present

## 2023-03-07 DIAGNOSIS — C7951 Secondary malignant neoplasm of bone: Secondary | ICD-10-CM | POA: Diagnosis present

## 2023-03-07 DIAGNOSIS — R1314 Dysphagia, pharyngoesophageal phase: Secondary | ICD-10-CM | POA: Diagnosis not present

## 2023-03-07 DIAGNOSIS — C3490 Malignant neoplasm of unspecified part of unspecified bronchus or lung: Secondary | ICD-10-CM

## 2023-03-07 DIAGNOSIS — R110 Nausea: Secondary | ICD-10-CM

## 2023-03-07 DIAGNOSIS — F419 Anxiety disorder, unspecified: Secondary | ICD-10-CM

## 2023-03-07 DIAGNOSIS — G893 Neoplasm related pain (acute) (chronic): Secondary | ICD-10-CM

## 2023-03-07 DIAGNOSIS — C801 Malignant (primary) neoplasm, unspecified: Secondary | ICD-10-CM

## 2023-03-07 DIAGNOSIS — R630 Anorexia: Secondary | ICD-10-CM

## 2023-03-07 LAB — CMP (CANCER CENTER ONLY)
ALT: 24 U/L (ref 0–44)
AST: 33 U/L (ref 15–41)
Albumin: 4.1 g/dL (ref 3.5–5.0)
Alkaline Phosphatase: 80 U/L (ref 38–126)
Anion gap: 8 (ref 5–15)
BUN: 15 mg/dL (ref 6–20)
CO2: 26 mmol/L (ref 22–32)
Calcium: 9.1 mg/dL (ref 8.9–10.3)
Chloride: 104 mmol/L (ref 98–111)
Creatinine: 0.92 mg/dL (ref 0.44–1.00)
GFR, Estimated: >60 mL/min (ref >60)
Glucose, Bld: 166 mg/dL — ABNORMAL HIGH (ref 70–99)
Potassium: 3.5 mmol/L (ref 3.5–5.1)
Sodium: 138 mmol/L (ref 135–145)
Total Bilirubin: 0.4 mg/dL (ref 0.3–1.2)
Total Protein: 7.1 g/dL (ref 6.5–8.1)

## 2023-03-07 LAB — CBC WITH DIFFERENTIAL (CANCER CENTER ONLY)
Abs Immature Granulocytes: 0.01 10*3/uL (ref 0.00–0.07)
Basophils Absolute: 0 10*3/uL (ref 0.0–0.1)
Basophils Relative: 1 %
Eosinophils Absolute: 0.1 10*3/uL (ref 0.0–0.5)
Eosinophils Relative: 2 %
HCT: 34.2 % — ABNORMAL LOW (ref 36.0–46.0)
Hemoglobin: 11.3 g/dL — ABNORMAL LOW (ref 12.0–15.0)
Immature Granulocytes: 0 %
Lymphocytes Relative: 31 %
Lymphs Abs: 1.8 10*3/uL (ref 0.7–4.0)
MCH: 31.2 pg (ref 26.0–34.0)
MCHC: 33 g/dL (ref 30.0–36.0)
MCV: 94.5 fL (ref 80.0–100.0)
Monocytes Absolute: 0.4 10*3/uL (ref 0.1–1.0)
Monocytes Relative: 7 %
Neutro Abs: 3.5 10*3/uL (ref 1.7–7.7)
Neutrophils Relative %: 59 %
Platelet Count: 314 10*3/uL (ref 150–400)
RBC: 3.62 MIL/uL — ABNORMAL LOW (ref 3.87–5.11)
RDW: 12.3 % (ref 11.5–15.5)
WBC Count: 5.9 10*3/uL (ref 4.0–10.5)
nRBC: 0 % (ref 0.0–0.2)

## 2023-03-07 LAB — MAGNESIUM: Magnesium: 1.7 mg/dL (ref 1.7–2.4)

## 2023-03-07 MED ORDER — HEPARIN SOD (PORK) LOCK FLUSH 100 UNIT/ML IV SOLN
500.0000 [IU] | Freq: Once | INTRAVENOUS | Status: AC
  Administered 2023-03-07: 500 [IU]

## 2023-03-07 MED ORDER — ONDANSETRON HCL 4 MG PO TABS: 4.0000 mg | ORAL_TABLET | Freq: Four times a day (QID) | ORAL | 0 refills | Status: DC

## 2023-03-07 MED ORDER — HYDROCODONE-ACETAMINOPHEN 5-325 MG PO TABS: 1.0000 | ORAL_TABLET | Freq: Four times a day (QID) | ORAL | 0 refills | Status: DC | PRN

## 2023-03-07 MED ORDER — FOLIC ACID 1 MG PO TABS: 1.0000 mg | ORAL_TABLET | Freq: Every day | ORAL | 3 refills | Status: DC

## 2023-03-07 MED ORDER — SODIUM CHLORIDE 0.9% FLUSH
10.0000 mL | Freq: Once | INTRAVENOUS | Status: AC
  Administered 2023-03-07: 10 mL

## 2023-03-07 MED ORDER — PROCHLORPERAZINE MALEATE 10 MG PO TABS: 10.0000 mg | ORAL_TABLET | Freq: Four times a day (QID) | ORAL | 1 refills | Status: DC | PRN
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
## 2023-03-08 ENCOUNTER — Ambulatory Visit: Payer: Managed Care, Other (non HMO) | Admitting: Radiation Oncology

## 2023-03-08 ENCOUNTER — Ambulatory Visit: Payer: Managed Care, Other (non HMO)

## 2023-03-09 ENCOUNTER — Ambulatory Visit: Payer: Managed Care, Other (non HMO) | Admitting: Radiation Oncology

## 2023-03-10 ENCOUNTER — Other Ambulatory Visit: Payer: Self-pay

## 2023-03-10 ENCOUNTER — Ambulatory Visit: Payer: Managed Care, Other (non HMO)

## 2023-03-10 ENCOUNTER — Ambulatory Visit
Admission: RE | Admit: 2023-03-10 | Discharge: 2023-03-10 | Disposition: A | Discharge status: Home / Self Care | Payer: Managed Care, Other (non HMO) | Source: Ambulatory Visit | Attending: Radiation Oncology | Admitting: Radiation Oncology

## 2023-03-10 DIAGNOSIS — C3490 Malignant neoplasm of unspecified part of unspecified bronchus or lung: Secondary | ICD-10-CM | POA: Diagnosis not present

## 2023-03-10 LAB — RAD ONC ARIA SESSION SUMMARY
Course Elapsed Days: 0
Plan Fractions Treated to Date: 1
Plan Prescribed Dose Per Fraction: 2.5 Gy
Plan Total Fractions Prescribed: 15
Plan Total Prescribed Dose: 37.5 Gy
Reference Point Dosage Given to Date: 2.5 Gy
Reference Point Session Dosage Given: 2.5 Gy
Session Number: 1

## 2023-03-11 ENCOUNTER — Ambulatory Visit
Admission: RE | Admit: 2023-03-11 | Discharge: 2023-03-11 | Disposition: A | Discharge status: Home / Self Care | Payer: Managed Care, Other (non HMO) | Source: Ambulatory Visit | Attending: Radiation Oncology | Admitting: Radiation Oncology

## 2023-03-11 ENCOUNTER — Other Ambulatory Visit: Payer: Self-pay

## 2023-03-11 ENCOUNTER — Inpatient Hospital Stay: Payer: Managed Care, Other (non HMO) | Admitting: Licensed Clinical Social Worker

## 2023-03-11 ENCOUNTER — Ambulatory Visit: Payer: Managed Care, Other (non HMO)

## 2023-03-11 DIAGNOSIS — C3490 Malignant neoplasm of unspecified part of unspecified bronchus or lung: Secondary | ICD-10-CM

## 2023-03-11 LAB — RAD ONC ARIA SESSION SUMMARY
Course Elapsed Days: 1
Plan Fractions Treated to Date: 2
Plan Prescribed Dose Per Fraction: 2.5 Gy
Plan Total Fractions Prescribed: 15
Plan Total Prescribed Dose: 37.5 Gy
Reference Point Dosage Given to Date: 5 Gy
Reference Point Session Dosage Given: 2.5 Gy
Session Number: 2

## 2023-03-11 NOTE — Progress Notes (Signed)
[facility] CSW Progress Note  Visual merchandiser met with patient and spouse to answer questions regarding disability.  CSW provided pt's spouse with contact information for both the DSS outstation and the financial resource specialist again, reiterating the process to apply for the [employer].  Spouse once again asked for clarification for SSD.  Pt is not a [place] citizen and will not qualify.  CSW inquired about pt's depression.  Pt's spouse informed CSW she has been connected to [facility] for a number of years and sees a counselor via telehealth once a month.  Pt reportedly suffers from depression and anxiety.  CSW encouraged pt's spouse to inform her providers at [facility] if there has been a significant change in her mood as it may be appropriate to change her medication, to which he verbalized agreement.  CSW to continue to provide support throughout duration of treatment as appropriate.      Rachel Moulds, LCSW Clinical Social Worker [facility]

## 2023-03-14 ENCOUNTER — Ambulatory Visit: Payer: Managed Care, Other (non HMO)

## 2023-03-14 ENCOUNTER — Other Ambulatory Visit: Payer: Self-pay

## 2023-03-14 ENCOUNTER — Ambulatory Visit
Admission: RE | Admit: 2023-03-14 | Discharge: 2023-03-14 | Disposition: A | Discharge status: Home / Self Care | Payer: Managed Care, Other (non HMO) | Source: Ambulatory Visit | Attending: Radiation Oncology | Admitting: Radiation Oncology

## 2023-03-14 ENCOUNTER — Encounter: Payer: Self-pay | Admitting: Hematology

## 2023-03-14 DIAGNOSIS — C3490 Malignant neoplasm of unspecified part of unspecified bronchus or lung: Secondary | ICD-10-CM | POA: Diagnosis not present

## 2023-03-14 LAB — RAD ONC ARIA SESSION SUMMARY
Course Elapsed Days: 4
Plan Fractions Treated to Date: 3
Plan Prescribed Dose Per Fraction: 2.5 Gy
Plan Total Fractions Prescribed: 15
Plan Total Prescribed Dose: 37.5 Gy
Reference Point Dosage Given to Date: 7.5 Gy
Reference Point Session Dosage Given: 2.5 Gy
Session Number: 3

## 2023-03-14 MED ORDER — CLONAZEPAM 1 MG PO TABS: 1.0000 mg | ORAL_TABLET | Freq: Two times a day (BID) | ORAL | 0 refills | Status: DC

## 2023-03-15 ENCOUNTER — Other Ambulatory Visit: Payer: Self-pay

## 2023-03-15 ENCOUNTER — Ambulatory Visit: Payer: Managed Care, Other (non HMO)

## 2023-03-15 ENCOUNTER — Ambulatory Visit
Admission: RE | Admit: 2023-03-15 | Discharge: 2023-03-15 | Disposition: A | Discharge status: Home / Self Care | Payer: Managed Care, Other (non HMO) | Source: Ambulatory Visit | Attending: Radiation Oncology | Admitting: Radiation Oncology

## 2023-03-15 DIAGNOSIS — C3490 Malignant neoplasm of unspecified part of unspecified bronchus or lung: Secondary | ICD-10-CM | POA: Diagnosis not present

## 2023-03-15 LAB — RAD ONC ARIA SESSION SUMMARY
Course Elapsed Days: 5
Plan Fractions Treated to Date: 4
Plan Prescribed Dose Per Fraction: 2.5 Gy
Plan Total Fractions Prescribed: 15
Plan Total Prescribed Dose: 37.5 Gy
Reference Point Dosage Given to Date: 10 Gy
Reference Point Session Dosage Given: 2.5 Gy
Session Number: 4

## 2023-03-16 ENCOUNTER — Ambulatory Visit
Admission: RE | Admit: 2023-03-16 | Discharge: 2023-03-16 | Disposition: A | Discharge status: Home / Self Care | Payer: Managed Care, Other (non HMO) | Source: Ambulatory Visit | Attending: Radiation Oncology | Admitting: Radiation Oncology

## 2023-03-16 ENCOUNTER — Other Ambulatory Visit: Payer: Self-pay

## 2023-03-16 ENCOUNTER — Ambulatory Visit: Payer: Managed Care, Other (non HMO)

## 2023-03-16 ENCOUNTER — Encounter: Payer: Self-pay | Admitting: Hematology

## 2023-03-16 DIAGNOSIS — C3490 Malignant neoplasm of unspecified part of unspecified bronchus or lung: Secondary | ICD-10-CM | POA: Diagnosis not present

## 2023-03-16 LAB — RAD ONC ARIA SESSION SUMMARY
Course Elapsed Days: 6
Plan Fractions Treated to Date: 5
Plan Prescribed Dose Per Fraction: 2.5 Gy
Plan Total Fractions Prescribed: 15
Plan Total Prescribed Dose: 37.5 Gy
Reference Point Dosage Given to Date: 12.5 Gy
Reference Point Session Dosage Given: 2.5 Gy
Session Number: 5

## 2023-03-17 ENCOUNTER — Ambulatory Visit: Payer: Managed Care, Other (non HMO)

## 2023-03-17 ENCOUNTER — Other Ambulatory Visit: Payer: Self-pay

## 2023-03-17 ENCOUNTER — Ambulatory Visit
Admission: RE | Admit: 2023-03-17 | Discharge: 2023-03-17 | Disposition: A | Discharge status: Home / Self Care | Payer: Managed Care, Other (non HMO) | Source: Ambulatory Visit | Attending: Radiation Oncology | Admitting: Radiation Oncology

## 2023-03-17 DIAGNOSIS — C3490 Malignant neoplasm of unspecified part of unspecified bronchus or lung: Secondary | ICD-10-CM | POA: Diagnosis not present

## 2023-03-17 LAB — RAD ONC ARIA SESSION SUMMARY
Course Elapsed Days: 7
Plan Fractions Treated to Date: 6
Plan Prescribed Dose Per Fraction: 2.5 Gy
Plan Total Fractions Prescribed: 15
Plan Total Prescribed Dose: 37.5 Gy
Reference Point Dosage Given to Date: 15 Gy
Reference Point Session Dosage Given: 2.5 Gy
Session Number: 6

## 2023-03-18 ENCOUNTER — Ambulatory Visit
Admission: RE | Admit: 2023-03-18 | Discharge: 2023-03-18 | Disposition: A | Discharge status: Home / Self Care | Payer: Managed Care, Other (non HMO) | Source: Ambulatory Visit | Attending: Radiation Oncology | Admitting: Radiation Oncology

## 2023-03-18 ENCOUNTER — Ambulatory Visit: Payer: Managed Care, Other (non HMO)

## 2023-03-18 ENCOUNTER — Ambulatory Visit
Admission: RE | Admit: 2023-03-18 | Discharge: 2023-03-18 | Disposition: A | Discharge status: Home / Self Care | Payer: Commercial Managed Care - HMO | Source: Ambulatory Visit | Attending: Radiation Oncology | Admitting: Radiation Oncology

## 2023-03-18 ENCOUNTER — Other Ambulatory Visit: Payer: Self-pay

## 2023-03-18 DIAGNOSIS — C3490 Malignant neoplasm of unspecified part of unspecified bronchus or lung: Secondary | ICD-10-CM | POA: Diagnosis not present

## 2023-03-18 LAB — RAD ONC ARIA SESSION SUMMARY
Course Elapsed Days: 8
Plan Fractions Treated to Date: 7
Plan Prescribed Dose Per Fraction: 2.5 Gy
Plan Total Fractions Prescribed: 15
Plan Total Prescribed Dose: 37.5 Gy
Reference Point Dosage Given to Date: 17.5 Gy
Reference Point Session Dosage Given: 2.5 Gy
Session Number: 7

## 2023-03-21 ENCOUNTER — Encounter: Payer: Self-pay | Admitting: Hematology

## 2023-03-21 ENCOUNTER — Ambulatory Visit
Admission: RE | Admit: 2023-03-21 | Discharge: 2023-03-21 | Disposition: A | Discharge status: Home / Self Care | Payer: Managed Care, Other (non HMO) | Source: Ambulatory Visit | Attending: Radiation Oncology | Admitting: Radiation Oncology

## 2023-03-21 ENCOUNTER — Ambulatory Visit: Payer: Managed Care, Other (non HMO)

## 2023-03-21 ENCOUNTER — Other Ambulatory Visit: Payer: Self-pay

## 2023-03-21 DIAGNOSIS — C3490 Malignant neoplasm of unspecified part of unspecified bronchus or lung: Secondary | ICD-10-CM | POA: Diagnosis not present

## 2023-03-21 LAB — RAD ONC ARIA SESSION SUMMARY
Course Elapsed Days: 11
Plan Fractions Treated to Date: 8
Plan Prescribed Dose Per Fraction: 2.5 Gy
Plan Total Fractions Prescribed: 15
Plan Total Prescribed Dose: 37.5 Gy
Reference Point Dosage Given to Date: 20 Gy
Reference Point Session Dosage Given: 2.5 Gy
Session Number: 8

## 2023-03-22 ENCOUNTER — Other Ambulatory Visit: Payer: Self-pay

## 2023-03-22 ENCOUNTER — Ambulatory Visit
Admission: RE | Admit: 2023-03-22 | Discharge: 2023-03-22 | Disposition: A | Discharge status: Home / Self Care | Payer: Commercial Managed Care - HMO | Source: Ambulatory Visit | Attending: Radiation Oncology | Admitting: Radiation Oncology

## 2023-03-22 ENCOUNTER — Ambulatory Visit: Payer: Commercial Managed Care - HMO

## 2023-03-22 DIAGNOSIS — C3490 Malignant neoplasm of unspecified part of unspecified bronchus or lung: Secondary | ICD-10-CM | POA: Diagnosis present

## 2023-03-22 DIAGNOSIS — Z510 Encounter for antineoplastic radiation therapy: Secondary | ICD-10-CM | POA: Diagnosis present

## 2023-03-22 LAB — RAD ONC ARIA SESSION SUMMARY
Course Elapsed Days: 12
Plan Fractions Treated to Date: 9
Plan Prescribed Dose Per Fraction: 2.5 Gy
Plan Total Fractions Prescribed: 15
Plan Total Prescribed Dose: 37.5 Gy
Reference Point Dosage Given to Date: 22.5 Gy
Reference Point Session Dosage Given: 2.5 Gy
Session Number: 9
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
## 2023-03-23 ENCOUNTER — Ambulatory Visit (HOSPITAL_COMMUNITY)
Admission: RE | Admit: 2023-03-23 | Discharge: 2023-03-23 | Disposition: A | Discharge status: Home / Self Care | Payer: Commercial Managed Care - HMO | Source: Ambulatory Visit | Attending: Hematology | Admitting: Hematology

## 2023-03-23 ENCOUNTER — Ambulatory Visit: Payer: Commercial Managed Care - HMO

## 2023-03-23 ENCOUNTER — Encounter: Payer: Self-pay | Admitting: Radiation Oncology

## 2023-03-23 ENCOUNTER — Ambulatory Visit
Admission: RE | Admit: 2023-03-23 | Discharge: 2023-03-23 | Disposition: A | Discharge status: Home / Self Care | Payer: Commercial Managed Care - HMO | Source: Ambulatory Visit | Attending: Radiation Oncology | Admitting: Radiation Oncology

## 2023-03-23 ENCOUNTER — Other Ambulatory Visit: Payer: Self-pay

## 2023-03-23 DIAGNOSIS — C7949 Secondary malignant neoplasm of other parts of nervous system: Secondary | ICD-10-CM | POA: Insufficient documentation

## 2023-03-23 DIAGNOSIS — C3490 Malignant neoplasm of unspecified part of unspecified bronchus or lung: Secondary | ICD-10-CM | POA: Insufficient documentation

## 2023-03-23 DIAGNOSIS — C801 Malignant (primary) neoplasm, unspecified: Secondary | ICD-10-CM | POA: Insufficient documentation

## 2023-03-23 LAB — RAD ONC ARIA SESSION SUMMARY
Course Elapsed Days: 13
Plan Fractions Treated to Date: 10
Plan Prescribed Dose Per Fraction: 2.5 Gy
Plan Total Fractions Prescribed: 15
Plan Total Prescribed Dose: 37.5 Gy
Reference Point Dosage Given to Date: 25 Gy
Reference Point Session Dosage Given: 2.5 Gy
Session Number: 10

## 2023-03-23 MED ORDER — GADOBUTROL 1 MMOL/ML IV SOLN
4.0000 mL | Freq: Once | INTRAVENOUS | Status: AC | PRN
  Administered 2023-03-23: 4 mL via INTRAVENOUS

## 2023-03-23 NOTE — Progress Notes (Signed)
Received call from patient's spouse regarding applying for grants. Reviewed chart and patient has 2 insurances. As of now there are currently not any grants patient would qualify for including [employer] and advised him of this. Advised should anything change, I will connect with them at that time. He verbalized understanding.  He has my card for any additional financial questions or concerns.

## 2023-03-24 ENCOUNTER — Encounter: Payer: Self-pay | Admitting: Nurse Practitioner

## 2023-03-24 ENCOUNTER — Ambulatory Visit: Payer: Commercial Managed Care - HMO

## 2023-03-24 ENCOUNTER — Other Ambulatory Visit: Payer: Self-pay

## 2023-03-24 ENCOUNTER — Ambulatory Visit
Admission: RE | Admit: 2023-03-24 | Discharge: 2023-03-24 | Disposition: A | Discharge status: Home / Self Care | Payer: Commercial Managed Care - HMO | Source: Ambulatory Visit | Attending: Radiation Oncology | Admitting: Radiation Oncology

## 2023-03-24 ENCOUNTER — Other Ambulatory Visit (HOSPITAL_COMMUNITY): Payer: Self-pay

## 2023-03-24 ENCOUNTER — Inpatient Hospital Stay: Payer: Commercial Managed Care - HMO | Attending: Hematology | Admitting: Nurse Practitioner

## 2023-03-24 VITALS — BP 100/64 | HR 84 | Temp 97.5°F | Resp 15 | Ht 62.0 in | Wt 93.4 lb

## 2023-03-24 DIAGNOSIS — Z515 Encounter for palliative care: Secondary | ICD-10-CM | POA: Diagnosis not present

## 2023-03-24 DIAGNOSIS — Z7189 Other specified counseling: Secondary | ICD-10-CM | POA: Diagnosis not present

## 2023-03-24 DIAGNOSIS — C3490 Malignant neoplasm of unspecified part of unspecified bronchus or lung: Secondary | ICD-10-CM | POA: Diagnosis not present

## 2023-03-24 DIAGNOSIS — R530 Neoplastic (malignant) related fatigue: Secondary | ICD-10-CM

## 2023-03-24 DIAGNOSIS — C419 Malignant neoplasm of bone and articular cartilage, unspecified: Secondary | ICD-10-CM

## 2023-03-24 DIAGNOSIS — G893 Neoplasm related pain (acute) (chronic): Secondary | ICD-10-CM

## 2023-03-24 DIAGNOSIS — R630 Anorexia: Secondary | ICD-10-CM

## 2023-03-24 LAB — RAD ONC ARIA SESSION SUMMARY
Course Elapsed Days: 14
Plan Fractions Treated to Date: 11
Plan Prescribed Dose Per Fraction: 2.5 Gy
Plan Total Fractions Prescribed: 15
Plan Total Prescribed Dose: 37.5 Gy
Reference Point Dosage Given to Date: 27.5 Gy
Reference Point Session Dosage Given: 2.5 Gy
Session Number: 11

## 2023-03-25 ENCOUNTER — Ambulatory Visit
Admission: RE | Admit: 2023-03-25 | Discharge: 2023-03-25 | Disposition: A | Discharge status: Home / Self Care | Payer: Commercial Managed Care - HMO | Source: Ambulatory Visit | Attending: Radiation Oncology | Admitting: Radiation Oncology

## 2023-03-25 ENCOUNTER — Ambulatory Visit: Payer: Commercial Managed Care - HMO

## 2023-03-25 ENCOUNTER — Other Ambulatory Visit: Payer: Self-pay

## 2023-03-25 DIAGNOSIS — C3490 Malignant neoplasm of unspecified part of unspecified bronchus or lung: Secondary | ICD-10-CM | POA: Diagnosis not present

## 2023-03-25 LAB — RAD ONC ARIA SESSION SUMMARY
Course Elapsed Days: 15
Plan Fractions Treated to Date: 12
Plan Prescribed Dose Per Fraction: 2.5 Gy
Plan Total Fractions Prescribed: 15
Plan Total Prescribed Dose: 37.5 Gy
Reference Point Dosage Given to Date: 30 Gy
Reference Point Session Dosage Given: 2.5 Gy
Session Number: 12

## 2023-03-28 ENCOUNTER — Ambulatory Visit: Payer: Commercial Managed Care - HMO

## 2023-03-28 ENCOUNTER — Other Ambulatory Visit: Payer: Self-pay

## 2023-03-28 ENCOUNTER — Ambulatory Visit
Admission: RE | Admit: 2023-03-28 | Discharge: 2023-03-28 | Disposition: A | Discharge status: Home / Self Care | Payer: Commercial Managed Care - HMO | Source: Ambulatory Visit | Attending: Radiation Oncology | Admitting: Radiation Oncology

## 2023-03-28 DIAGNOSIS — C3490 Malignant neoplasm of unspecified part of unspecified bronchus or lung: Secondary | ICD-10-CM | POA: Diagnosis not present

## 2023-03-28 LAB — RAD ONC ARIA SESSION SUMMARY
Course Elapsed Days: 18
Plan Fractions Treated to Date: 13
Plan Prescribed Dose Per Fraction: 2.5 Gy
Plan Total Fractions Prescribed: 15
Plan Total Prescribed Dose: 37.5 Gy
Reference Point Dosage Given to Date: 32.5 Gy
Reference Point Session Dosage Given: 2.5 Gy
Session Number: 13

## 2023-03-29 ENCOUNTER — Ambulatory Visit: Payer: Commercial Managed Care - HMO

## 2023-03-30 ENCOUNTER — Ambulatory Visit: Payer: Commercial Managed Care - HMO

## 2023-03-31 ENCOUNTER — Ambulatory Visit: Payer: Commercial Managed Care - HMO

## 2023-03-31 ENCOUNTER — Telehealth: Payer: Self-pay | Admitting: *Deleted

## 2023-03-31 NOTE — Telephone Encounter (Signed)
Spoke with the patient's husband to see how she is doing, she has not been in for treatment in the last couple of days.  He states she is not feeling well, having diarrhea, nausea, and increased fatigue.  She is taking all of her medications.  She does not want to come in today or tomorrow for treatment, she plans to resume on Monday.  Providers and treatment machine informed.    Lind Covert RN, BSN

## 2023-04-01 ENCOUNTER — Other Ambulatory Visit (HOSPITAL_COMMUNITY): Payer: Self-pay

## 2023-04-01 ENCOUNTER — Ambulatory Visit: Payer: Commercial Managed Care - HMO

## 2023-04-02 ENCOUNTER — Encounter: Payer: Self-pay | Admitting: Hematology

## 2023-04-02 ENCOUNTER — Other Ambulatory Visit (HOSPITAL_COMMUNITY): Payer: Self-pay

## 2023-04-04 ENCOUNTER — Other Ambulatory Visit: Payer: Self-pay | Admitting: Physician Assistant

## 2023-04-04 ENCOUNTER — Other Ambulatory Visit (HOSPITAL_COMMUNITY): Payer: Self-pay

## 2023-04-04 ENCOUNTER — Other Ambulatory Visit: Payer: Self-pay

## 2023-04-04 ENCOUNTER — Ambulatory Visit
Admission: RE | Admit: 2023-04-04 | Discharge: 2023-04-04 | Disposition: A | Discharge status: Home / Self Care | Payer: Commercial Managed Care - HMO | Source: Ambulatory Visit | Attending: Radiation Oncology | Admitting: Radiation Oncology

## 2023-04-04 ENCOUNTER — Ambulatory Visit: Payer: Commercial Managed Care - HMO

## 2023-04-04 DIAGNOSIS — C3490 Malignant neoplasm of unspecified part of unspecified bronchus or lung: Secondary | ICD-10-CM

## 2023-04-04 LAB — RAD ONC ARIA SESSION SUMMARY
Course Elapsed Days: 25
Plan Fractions Treated to Date: 14
Plan Prescribed Dose Per Fraction: 2.5 Gy
Plan Total Fractions Prescribed: 15
Plan Total Prescribed Dose: 37.5 Gy
Reference Point Dosage Given to Date: 35 Gy
Reference Point Session Dosage Given: 2.5 Gy
Session Number: 14
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
## 2023-04-05 ENCOUNTER — Inpatient Hospital Stay (HOSPITAL_BASED_OUTPATIENT_CLINIC_OR_DEPARTMENT_OTHER): Payer: Commercial Managed Care - HMO | Admitting: Nurse Practitioner

## 2023-04-05 ENCOUNTER — Other Ambulatory Visit: Payer: Self-pay

## 2023-04-05 ENCOUNTER — Ambulatory Visit
Admission: RE | Admit: 2023-04-05 | Discharge: 2023-04-05 | Disposition: A | Discharge status: Home / Self Care | Payer: Commercial Managed Care - HMO | Source: Ambulatory Visit | Attending: Radiation Oncology | Admitting: Radiation Oncology

## 2023-04-05 ENCOUNTER — Inpatient Hospital Stay: Payer: Commercial Managed Care - HMO

## 2023-04-05 ENCOUNTER — Inpatient Hospital Stay (HOSPITAL_BASED_OUTPATIENT_CLINIC_OR_DEPARTMENT_OTHER): Payer: Commercial Managed Care - HMO | Admitting: Physician Assistant

## 2023-04-05 ENCOUNTER — Encounter: Payer: Self-pay | Admitting: Nurse Practitioner

## 2023-04-05 VITALS — BP 93/58 | HR 83 | Temp 97.7°F | Resp 14 | Ht 62.0 in | Wt 93.9 lb

## 2023-04-05 DIAGNOSIS — R112 Nausea with vomiting, unspecified: Secondary | ICD-10-CM | POA: Diagnosis not present

## 2023-04-05 DIAGNOSIS — Z515 Encounter for palliative care: Secondary | ICD-10-CM

## 2023-04-05 DIAGNOSIS — C3490 Malignant neoplasm of unspecified part of unspecified bronchus or lung: Secondary | ICD-10-CM | POA: Insufficient documentation

## 2023-04-05 DIAGNOSIS — F32A Depression, unspecified: Secondary | ICD-10-CM | POA: Insufficient documentation

## 2023-04-05 DIAGNOSIS — F419 Anxiety disorder, unspecified: Secondary | ICD-10-CM | POA: Insufficient documentation

## 2023-04-05 DIAGNOSIS — R530 Neoplastic (malignant) related fatigue: Secondary | ICD-10-CM

## 2023-04-05 DIAGNOSIS — K5900 Constipation, unspecified: Secondary | ICD-10-CM | POA: Diagnosis not present

## 2023-04-05 DIAGNOSIS — G893 Neoplasm related pain (acute) (chronic): Secondary | ICD-10-CM | POA: Insufficient documentation

## 2023-04-05 DIAGNOSIS — Z79899 Other long term (current) drug therapy: Secondary | ICD-10-CM | POA: Insufficient documentation

## 2023-04-05 DIAGNOSIS — Z510 Encounter for antineoplastic radiation therapy: Secondary | ICD-10-CM | POA: Insufficient documentation

## 2023-04-05 DIAGNOSIS — E876 Hypokalemia: Secondary | ICD-10-CM

## 2023-04-05 DIAGNOSIS — C7951 Secondary malignant neoplasm of bone: Secondary | ICD-10-CM | POA: Insufficient documentation

## 2023-04-05 LAB — CMP (CANCER CENTER ONLY)
ALT: 18 U/L (ref 0–44)
AST: 26 U/L (ref 15–41)
Albumin: 4 g/dL (ref 3.5–5.0)
Alkaline Phosphatase: 61 U/L (ref 38–126)
Anion gap: 10 (ref 5–15)
BUN: 22 mg/dL — ABNORMAL HIGH (ref 6–20)
CO2: 25 mmol/L (ref 22–32)
Calcium: 9.5 mg/dL (ref 8.9–10.3)
Chloride: 106 mmol/L (ref 98–111)
Creatinine: 1.37 mg/dL — ABNORMAL HIGH (ref 0.44–1.00)
GFR, Estimated: 45 mL/min — ABNORMAL LOW (ref >60)
Glucose, Bld: 131 mg/dL — ABNORMAL HIGH (ref 70–99)
Potassium: 3.3 mmol/L — ABNORMAL LOW (ref 3.5–5.1)
Sodium: 141 mmol/L (ref 135–145)
Total Bilirubin: 0.3 mg/dL (ref 0.3–1.2)
Total Protein: 7 g/dL (ref 6.5–8.1)

## 2023-04-05 LAB — RAD ONC ARIA SESSION SUMMARY
Course Elapsed Days: 26
Plan Fractions Treated to Date: 15
Plan Prescribed Dose Per Fraction: 2.5 Gy
Plan Total Fractions Prescribed: 15
Plan Total Prescribed Dose: 37.5 Gy
Reference Point Dosage Given to Date: 37.5 Gy
Reference Point Session Dosage Given: 2.5 Gy
Session Number: 15

## 2023-04-05 LAB — CBC WITH DIFFERENTIAL (CANCER CENTER ONLY)
Abs Immature Granulocytes: 0.02 10*3/uL (ref 0.00–0.07)
Basophils Absolute: 0 10*3/uL (ref 0.0–0.1)
Basophils Relative: 0 %
Eosinophils Absolute: 0 10*3/uL (ref 0.0–0.5)
Eosinophils Relative: 1 %
HCT: 28.9 % — ABNORMAL LOW (ref 36.0–46.0)
Hemoglobin: 9.9 g/dL — ABNORMAL LOW (ref 12.0–15.0)
Immature Granulocytes: 0 %
Lymphocytes Relative: 27 %
Lymphs Abs: 1.4 10*3/uL (ref 0.7–4.0)
MCH: 31.7 pg (ref 26.0–34.0)
MCHC: 34.3 g/dL (ref 30.0–36.0)
MCV: 92.6 fL (ref 80.0–100.0)
Monocytes Absolute: 0.5 10*3/uL (ref 0.1–1.0)
Monocytes Relative: 9 %
Neutro Abs: 3.2 10*3/uL (ref 1.7–7.7)
Neutrophils Relative %: 63 %
Platelet Count: 195 10*3/uL (ref 150–400)
RBC: 3.12 MIL/uL — ABNORMAL LOW (ref 3.87–5.11)
RDW: 12.9 % (ref 11.5–15.5)
WBC Count: 5.2 10*3/uL (ref 4.0–10.5)
nRBC: 0 % (ref 0.0–0.2)

## 2023-04-05 LAB — MAGNESIUM: Magnesium: 1.7 mg/dL (ref 1.7–2.4)

## 2023-04-05 MED ORDER — SENNOSIDES-DOCUSATE SODIUM 8.6-50 MG PO TABS: 1.0000 | ORAL_TABLET | Freq: Two times a day (BID) | ORAL | 3 refills | Status: DC

## 2023-04-05 MED ORDER — POTASSIUM CHLORIDE CRYS ER 20 MEQ PO TBCR: 20.0000 meq | EXTENDED_RELEASE_TABLET | Freq: Every day | ORAL | 2 refills | Status: DC

## 2023-04-05 MED ORDER — HYDROCODONE-ACETAMINOPHEN 5-325 MG PO TABS: 1.0000 | ORAL_TABLET | Freq: Four times a day (QID) | ORAL | 0 refills | Status: DC | PRN
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
## 2023-04-06 ENCOUNTER — Encounter: Payer: Self-pay | Admitting: Physician Assistant

## 2023-04-06 NOTE — Radiation Completion Notes (Signed)
Radiation Oncology         [phone] ________________________________  Name: Karen [name] MRN: [id number]  Date of Service: 04/05/2023  DOB: [birth year]  End of Treatment Note    Diagnosis:  Stage IV, NSCLC, adenocarcinoma with BRAF mutation involving the lungs, multiple nodal stations, bone, and bilateral choroid of the eyes   Intent: Palliative     ==========DELIVERED PLANS==========  First Treatment Date: 2023-03-10 - Last Treatment Date: 2023-04-05   Plan Name: HN_Eyes_Bilat Site: Face Technique: IMRT Mode: Photon Dose Per Fraction: 2.5 Gy Prescribed Dose (Delivered / Prescribed): 37.5 Gy / 37.5 Gy Prescribed Fxs (Delivered / Prescribed): 15 / 15     ==========ON TREATMENT VISIT DATES========== 2023-03-11, 2023-03-18, 2023-03-25   See weekly On Treatment Notes in Epic for details. The patient tolerated radiation. She had headaches during radiation that she was taking narcotic pain mediation to manage in addition to abdominal and back pain and will be following with palliative medicine who helps with her neoplasm related pain.  The patient will receive a call in about one month from the radiation oncology department. She will continue follow up with Dr. Candise Che as well.      Osker Mason, PACS

## 2023-04-14 ENCOUNTER — Encounter: Payer: Self-pay | Admitting: Hematology

## 2023-04-14 ENCOUNTER — Other Ambulatory Visit: Payer: Self-pay

## 2023-04-18 ENCOUNTER — Other Ambulatory Visit: Payer: Self-pay

## 2023-04-20 ENCOUNTER — Other Ambulatory Visit: Payer: Self-pay | Admitting: Hematology

## 2023-04-20 DIAGNOSIS — C3490 Malignant neoplasm of unspecified part of unspecified bronchus or lung: Secondary | ICD-10-CM

## 2023-04-21 ENCOUNTER — Other Ambulatory Visit: Payer: Self-pay

## 2023-04-21 ENCOUNTER — Other Ambulatory Visit (HOSPITAL_COMMUNITY): Payer: Self-pay

## 2023-04-21 MED ORDER — TRAMETINIB DIMETHYL SULFOXIDE 2 MG PO TABS
2.0000 mg | ORAL_TABLET | Freq: Every day | ORAL | 2 refills | Status: DC
  Filled 2023-04-21 – 2023-04-28 (×2): qty 30, 30d supply, fill #0
  Filled 2023-05-27: qty 30, 30d supply, fill #1
  Filled 2023-06-27 – 2023-07-07 (×3): qty 30, 30d supply, fill #2

## 2023-04-21 MED ORDER — DEXAMETHASONE 0.5 MG PO TABS
0.5000 mg | ORAL_TABLET | Freq: Every day | ORAL | 1 refills | Status: DC
  Filled 2023-04-21: qty 30, 30d supply, fill #0

## 2023-04-21 MED ORDER — CLONAZEPAM 1 MG PO TABS: 1.0000 mg | ORAL_TABLET | Freq: Two times a day (BID) | ORAL | 0 refills | Status: DC

## 2023-04-21 NOTE — Telephone Encounter (Signed)
Next appt scheduled 05/09/23 with PCP.

## 2023-04-22 ENCOUNTER — Ambulatory Visit (HOSPITAL_COMMUNITY)
Admission: RE | Admit: 2023-04-22 | Discharge: 2023-04-22 | Disposition: A | Discharge status: Home / Self Care | Payer: Commercial Managed Care - HMO | Source: Ambulatory Visit | Attending: Physician Assistant | Admitting: Physician Assistant

## 2023-04-22 DIAGNOSIS — C3490 Malignant neoplasm of unspecified part of unspecified bronchus or lung: Secondary | ICD-10-CM | POA: Insufficient documentation

## 2023-04-22 MED ORDER — IOHEXOL 300 MG/ML  SOLN
100.0000 mL | Freq: Once | INTRAMUSCULAR | Status: AC | PRN
  Administered 2023-04-22: 100 mL via INTRAVENOUS

## 2023-04-22 MED ORDER — IOHEXOL 9 MG/ML PO SOLN
1000.0000 mL | ORAL | Status: AC
  Administered 2023-04-22: 1000 mL via ORAL

## 2023-04-28 ENCOUNTER — Other Ambulatory Visit: Payer: Self-pay | Admitting: *Deleted

## 2023-04-28 ENCOUNTER — Other Ambulatory Visit: Payer: Self-pay | Admitting: Hematology

## 2023-04-28 ENCOUNTER — Other Ambulatory Visit: Payer: Self-pay

## 2023-04-28 ENCOUNTER — Other Ambulatory Visit (HOSPITAL_COMMUNITY): Payer: Self-pay

## 2023-04-28 DIAGNOSIS — C3490 Malignant neoplasm of unspecified part of unspecified bronchus or lung: Secondary | ICD-10-CM

## 2023-04-28 MED ORDER — DABRAFENIB MESYLATE 75 MG PO CAPS
150.0000 mg | ORAL_CAPSULE | Freq: Two times a day (BID) | ORAL | 2 refills | Status: DC
  Filled 2023-04-28: qty 120, 30d supply, fill #0
  Filled 2023-05-27: qty 120, 30d supply, fill #1
  Filled 2023-06-27 – 2023-07-07 (×3): qty 120, 30d supply, fill #2

## 2023-04-28 NOTE — Progress Notes (Signed)
Specialty Pharmacy Refill Coordination Note  Karen [name] is a 57 y.o. female contacted today regarding refills of specialty medication(s) Dabrafenib Mesylate; Trametinib Dimethyl Sulfoxide   Patient requested Pickup at [facility] at [place] date: 05/03/23   Medication will be filled on 05/02/23.   Refill request sent for Tafinlar.
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
## 2023-04-29 ENCOUNTER — Inpatient Hospital Stay: Payer: Commercial Managed Care - HMO | Attending: Hematology

## 2023-04-29 ENCOUNTER — Other Ambulatory Visit: Payer: Self-pay

## 2023-04-29 ENCOUNTER — Inpatient Hospital Stay (HOSPITAL_BASED_OUTPATIENT_CLINIC_OR_DEPARTMENT_OTHER): Payer: Commercial Managed Care - HMO | Admitting: Hematology

## 2023-04-29 VITALS — BP 94/63 | HR 81 | Temp 97.7°F | Resp 16 | Ht 62.0 in | Wt 96.7 lb

## 2023-04-29 DIAGNOSIS — R053 Chronic cough: Secondary | ICD-10-CM | POA: Diagnosis not present

## 2023-04-29 DIAGNOSIS — G893 Neoplasm related pain (acute) (chronic): Secondary | ICD-10-CM | POA: Diagnosis not present

## 2023-04-29 DIAGNOSIS — F419 Anxiety disorder, unspecified: Secondary | ICD-10-CM | POA: Diagnosis not present

## 2023-04-29 DIAGNOSIS — Z7902 Long term (current) use of antithrombotics/antiplatelets: Secondary | ICD-10-CM | POA: Diagnosis not present

## 2023-04-29 DIAGNOSIS — R490 Dysphonia: Secondary | ICD-10-CM | POA: Insufficient documentation

## 2023-04-29 DIAGNOSIS — E43 Unspecified severe protein-calorie malnutrition: Secondary | ICD-10-CM | POA: Insufficient documentation

## 2023-04-29 DIAGNOSIS — R110 Nausea: Secondary | ICD-10-CM | POA: Insufficient documentation

## 2023-04-29 DIAGNOSIS — C7951 Secondary malignant neoplasm of bone: Secondary | ICD-10-CM | POA: Insufficient documentation

## 2023-04-29 DIAGNOSIS — C3490 Malignant neoplasm of unspecified part of unspecified bronchus or lung: Secondary | ICD-10-CM

## 2023-04-29 DIAGNOSIS — Z8673 Personal history of transient ischemic attack (TIA), and cerebral infarction without residual deficits: Secondary | ICD-10-CM | POA: Diagnosis not present

## 2023-04-29 DIAGNOSIS — Z79899 Other long term (current) drug therapy: Secondary | ICD-10-CM | POA: Diagnosis not present

## 2023-04-29 DIAGNOSIS — Z8615 Personal history of latent tuberculosis infection: Secondary | ICD-10-CM | POA: Diagnosis not present

## 2023-04-29 DIAGNOSIS — R634 Abnormal weight loss: Secondary | ICD-10-CM | POA: Insufficient documentation

## 2023-04-29 DIAGNOSIS — R1310 Dysphagia, unspecified: Secondary | ICD-10-CM | POA: Insufficient documentation

## 2023-04-29 DIAGNOSIS — Z923 Personal history of irradiation: Secondary | ICD-10-CM | POA: Diagnosis not present

## 2023-04-29 DIAGNOSIS — F32A Depression, unspecified: Secondary | ICD-10-CM | POA: Diagnosis not present

## 2023-04-29 LAB — CMP (CANCER CENTER ONLY)
ALT: 16 U/L (ref 0–44)
AST: 23 U/L (ref 15–41)
Albumin: 3.9 g/dL (ref 3.5–5.0)
Alkaline Phosphatase: 76 U/L (ref 38–126)
Anion gap: 4 — ABNORMAL LOW (ref 5–15)
BUN: 22 mg/dL — ABNORMAL HIGH (ref 6–20)
CO2: 31 mmol/L (ref 22–32)
Calcium: 9.5 mg/dL (ref 8.9–10.3)
Chloride: 104 mmol/L (ref 98–111)
Creatinine: 0.96 mg/dL (ref 0.44–1.00)
GFR, Estimated: >60 mL/min (ref >60)
Glucose, Bld: 90 mg/dL (ref 70–99)
Potassium: 3.8 mmol/L (ref 3.5–5.1)
Sodium: 139 mmol/L (ref 135–145)
Total Bilirubin: 0.3 mg/dL (ref <1.2)
Total Protein: 6.7 g/dL (ref 6.5–8.1)

## 2023-04-29 LAB — CBC WITH DIFFERENTIAL (CANCER CENTER ONLY)
Abs Immature Granulocytes: 0.01 10*3/uL (ref 0.00–0.07)
Basophils Absolute: 0 10*3/uL (ref 0.0–0.1)
Basophils Relative: 1 %
Eosinophils Absolute: 0.1 10*3/uL (ref 0.0–0.5)
Eosinophils Relative: 2 %
HCT: 31.7 % — ABNORMAL LOW (ref 36.0–46.0)
Hemoglobin: 10.7 g/dL — ABNORMAL LOW (ref 12.0–15.0)
Immature Granulocytes: 0 %
Lymphocytes Relative: 40 %
Lymphs Abs: 1.7 10*3/uL (ref 0.7–4.0)
MCH: 31.5 pg (ref 26.0–34.0)
MCHC: 33.8 g/dL (ref 30.0–36.0)
MCV: 93.2 fL (ref 80.0–100.0)
Monocytes Absolute: 0.4 10*3/uL (ref 0.1–1.0)
Monocytes Relative: 10 %
Neutro Abs: 2 10*3/uL (ref 1.7–7.7)
Neutrophils Relative %: 47 %
Platelet Count: 253 10*3/uL (ref 150–400)
RBC: 3.4 MIL/uL — ABNORMAL LOW (ref 3.87–5.11)
RDW: 12.9 % (ref 11.5–15.5)
WBC Count: 4.2 10*3/uL (ref 4.0–10.5)
nRBC: 0 % (ref 0.0–0.2)

## 2023-04-29 LAB — MAGNESIUM: Magnesium: 1.7 mg/dL (ref 1.7–2.4)

## 2023-05-02 ENCOUNTER — Other Ambulatory Visit (HOSPITAL_COMMUNITY): Payer: Self-pay

## 2023-05-02 ENCOUNTER — Encounter: Payer: Self-pay | Admitting: Hematology

## 2023-05-03 ENCOUNTER — Other Ambulatory Visit (HOSPITAL_COMMUNITY): Payer: Self-pay

## 2023-05-05 ENCOUNTER — Encounter: Payer: Self-pay | Admitting: Hematology

## 2023-05-09 ENCOUNTER — Encounter: Payer: Self-pay | Admitting: Hematology

## 2023-05-09 ENCOUNTER — Encounter: Payer: Self-pay | Admitting: Student

## 2023-05-09 ENCOUNTER — Ambulatory Visit: Payer: Managed Care, Other (non HMO) | Admitting: Student

## 2023-05-09 VITALS — BP 101/61 | HR 80 | Temp 97.7°F | Ht 62.0 in | Wt 94.8 lb

## 2023-05-09 DIAGNOSIS — C3490 Malignant neoplasm of unspecified part of unspecified bronchus or lung: Secondary | ICD-10-CM

## 2023-05-09 DIAGNOSIS — F419 Anxiety disorder, unspecified: Secondary | ICD-10-CM

## 2023-05-09 DIAGNOSIS — Z23 Encounter for immunization: Secondary | ICD-10-CM | POA: Diagnosis not present

## 2023-05-09 DIAGNOSIS — M5416 Radiculopathy, lumbar region: Secondary | ICD-10-CM

## 2023-05-09 DIAGNOSIS — H539 Unspecified visual disturbance: Secondary | ICD-10-CM

## 2023-05-09 DIAGNOSIS — G893 Neoplasm related pain (acute) (chronic): Secondary | ICD-10-CM
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
## 2023-05-09 NOTE — Patient Instructions (Addendum)
Joesph July. Helene Shoe por permitirnos brindarle su atencin hoy. Hoy hablamos de su salud general.  Usted expres su preocupacin por la necesidad de reiniciar sus inyecciones de esteroides en la espalda. Conley Rolls he enviado una derivacin para ortopedia ambulatoria. Te estarn llamando para programar esa cita.  - Contine el seguimiento con sus onclogos.  I have ordered the following labs for you:  Lab Orders  No laboratory test(s) ordered today     Referrals ordered today:   Referral Orders  No referral(s) requested today     I have ordered the following medication/changed the following medications:   Stop the following medications: There are no discontinued medications.   Start the following medications: No orders of the defined types were placed in this encounter.    Follow up: 4-6 months    Should you have any questions or concerns please call the internal medicine clinic at [phone].    Kathleen Lime, M.D [facility]

## 2023-05-10 NOTE — Progress Notes (Signed)
Internal Medicine Clinic Attending  I was physically present during the key portions of the resident provided service and participated in the medical decision making of patient's management care. I reviewed pertinent patient test results.  The assessment, diagnosis, and plan were formulated together and I agree with the documentation in the resident's note.  Mercie Eon, MD

## 2023-05-11 ENCOUNTER — Encounter: Payer: Self-pay | Admitting: Hematology

## 2023-05-18 ENCOUNTER — Other Ambulatory Visit: Payer: Self-pay

## 2023-05-18 MED ORDER — ATORVASTATIN CALCIUM 40 MG PO TABS: 40.0000 mg | ORAL_TABLET | Freq: Every day | ORAL | 2 refills | Status: DC
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
## 2023-05-23 ENCOUNTER — Other Ambulatory Visit: Payer: Self-pay

## 2023-05-23 MED ORDER — CLONAZEPAM 1 MG PO TABS: 1.0000 mg | ORAL_TABLET | Freq: Two times a day (BID) | ORAL | 0 refills | Status: DC

## 2023-05-24 ENCOUNTER — Other Ambulatory Visit (HOSPITAL_COMMUNITY): Payer: Self-pay

## 2023-05-24 ENCOUNTER — Encounter: Payer: Self-pay | Admitting: Hematology

## 2023-05-24 ENCOUNTER — Inpatient Hospital Stay: Payer: Commercial Managed Care - HMO | Attending: Hematology | Admitting: Nurse Practitioner

## 2023-05-24 ENCOUNTER — Encounter: Payer: Self-pay | Admitting: Nurse Practitioner

## 2023-05-24 VITALS — BP 96/71 | HR 84 | Temp 98.0°F | Resp 16 | Ht 62.0 in | Wt 93.4 lb

## 2023-05-24 DIAGNOSIS — R530 Neoplastic (malignant) related fatigue: Secondary | ICD-10-CM | POA: Diagnosis not present

## 2023-05-24 DIAGNOSIS — G893 Neoplasm related pain (acute) (chronic): Secondary | ICD-10-CM

## 2023-05-24 DIAGNOSIS — H539 Unspecified visual disturbance: Secondary | ICD-10-CM

## 2023-05-24 DIAGNOSIS — R630 Anorexia: Secondary | ICD-10-CM

## 2023-05-24 DIAGNOSIS — C419 Malignant neoplasm of bone and articular cartilage, unspecified: Secondary | ICD-10-CM

## 2023-05-24 DIAGNOSIS — Z515 Encounter for palliative care: Secondary | ICD-10-CM

## 2023-05-24 MED ORDER — DEXAMETHASONE 1 MG PO TABS
1.0000 mg | ORAL_TABLET | Freq: Every day | ORAL | 1 refills | Status: DC
  Filled 2023-05-24: qty 30, 30d supply, fill #0
  Filled 2023-08-01: qty 30, 30d supply, fill #1

## 2023-05-24 MED ORDER — CYCLOBENZAPRINE HCL 5 MG PO TABS
5.0000 mg | ORAL_TABLET | Freq: Three times a day (TID) | ORAL | 0 refills | Status: DC | PRN
  Filled 2023-05-24: qty 30, 10d supply, fill #0

## 2023-05-25 ENCOUNTER — Other Ambulatory Visit: Payer: Self-pay

## 2023-05-27 ENCOUNTER — Other Ambulatory Visit: Payer: Self-pay

## 2023-05-27 NOTE — Progress Notes (Signed)
Specialty Pharmacy Ongoing Clinical Assessment Note  Karen [name] is a 57 y.o. female who is being followed by the specialty pharmacy service for RxSp Oncology   Patient's specialty medication(s) reviewed today: Dabrafenib Mesylate; Trametinib Dimethyl Sulfoxide   Missed doses in the last 4 weeks: 0   Patient/Caregiver did not have any additional questions or concerns.   Therapeutic benefit summary: Patient is achieving benefit   Adverse events/side effects summary: No adverse events/side effects   Patient's therapy is appropriate to: Continue    Goals Addressed             This Visit's Progress    Slow Disease Progression       Patient is on track. Patient will maintain adherence. Per provider note from 11/8 labs are stable and provider was awaiting official reading of recent scans but did not anticipate progression.          Follow up:  3 months  Otto Herb Specialty Pharmacist

## 2023-05-27 NOTE — Progress Notes (Signed)
Specialty Pharmacy Refill Coordination Note  Karen [name] is a 57 y.o. female contacted today regarding refills of specialty medication(s) Dabrafenib Mesylate; Trametinib Dimethyl Sulfoxide   Patient requested Pickup at [facility] [place] date: 05/31/23   Medication will be filled on 05/30/23.

## 2023-05-30 ENCOUNTER — Other Ambulatory Visit: Payer: Self-pay

## 2023-05-31 ENCOUNTER — Other Ambulatory Visit: Payer: Self-pay

## 2023-05-31 ENCOUNTER — Ambulatory Visit
Admission: RE | Admit: 2023-05-31 | Discharge: 2023-05-31 | Disposition: A | Discharge status: Home / Self Care | Payer: Commercial Managed Care - HMO | Source: Ambulatory Visit | Attending: Hematology | Admitting: Hematology

## 2023-05-31 DIAGNOSIS — E876 Hypokalemia: Secondary | ICD-10-CM

## 2023-05-31 DIAGNOSIS — C3490 Malignant neoplasm of unspecified part of unspecified bronchus or lung: Secondary | ICD-10-CM

## 2023-05-31 DIAGNOSIS — Z515 Encounter for palliative care: Secondary | ICD-10-CM

## 2023-05-31 MED ORDER — FOLIC ACID 1 MG PO TABS: 1.0000 mg | ORAL_TABLET | Freq: Every day | ORAL | 3 refills | Status: DC

## 2023-05-31 MED ORDER — POTASSIUM CHLORIDE CRYS ER 20 MEQ PO TBCR: 20.0000 meq | EXTENDED_RELEASE_TABLET | Freq: Every day | ORAL | 2 refills | Status: DC

## 2023-05-31 NOTE — Progress Notes (Signed)
  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    The patient was not available for call today. Voicemail not available.    The patient has scheduled follow up with her medical oncologist Dr. Candise Che for ongoing surveillance. The patient was encouraged to call if she develops concerns or questions regarding radiation.   This concludes the interaction.  Ruel Favors, LPN

## 2023-06-01 MED ORDER — ALBUTEROL SULFATE HFA 108 (90 BASE) MCG/ACT IN AERS: 2.0000 | INHALATION_SPRAY | RESPIRATORY_TRACT | 1 refills | Status: AC

## 2023-06-02 ENCOUNTER — Other Ambulatory Visit (HOSPITAL_COMMUNITY): Payer: Self-pay

## 2023-06-07 ENCOUNTER — Ambulatory Visit
Admission: RE | Admit: 2023-06-07 | Discharge: 2023-06-07 | Disposition: A | Discharge status: Home / Self Care | Payer: Commercial Managed Care - HMO | Source: Ambulatory Visit | Attending: Hematology | Admitting: Hematology

## 2023-06-07 NOTE — Progress Notes (Signed)
  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 No voicemail available. Last attempt x4 w/ assistance by [facility] interpreters ID# [id number].  The patient has scheduled follow up with her medical oncologist Dr. Candise Che for ongoing surveillance. The patient was encouraged to call if she develops concerns or questions regarding radiation.
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
## 2023-06-17 ENCOUNTER — Other Ambulatory Visit (HOSPITAL_COMMUNITY): Payer: Self-pay

## 2023-06-20 ENCOUNTER — Other Ambulatory Visit: Payer: Self-pay

## 2023-06-21 ENCOUNTER — Other Ambulatory Visit: Payer: Self-pay

## 2023-06-21 ENCOUNTER — Inpatient Hospital Stay: Payer: Commercial Managed Care - HMO | Admitting: Nurse Practitioner

## 2023-06-21 ENCOUNTER — Telehealth: Payer: Self-pay | Admitting: Nurse Practitioner

## 2023-06-21 ENCOUNTER — Telehealth: Payer: Self-pay

## 2023-06-21 DIAGNOSIS — R110 Nausea: Secondary | ICD-10-CM

## 2023-06-21 DIAGNOSIS — G893 Neoplasm related pain (acute) (chronic): Secondary | ICD-10-CM

## 2023-06-21 DIAGNOSIS — R630 Anorexia: Secondary | ICD-10-CM

## 2023-06-21 DIAGNOSIS — Z515 Encounter for palliative care: Secondary | ICD-10-CM

## 2023-06-21 DIAGNOSIS — R112 Nausea with vomiting, unspecified: Secondary | ICD-10-CM

## 2023-06-21 DIAGNOSIS — R634 Abnormal weight loss: Secondary | ICD-10-CM

## 2023-06-21 DIAGNOSIS — C3490 Malignant neoplasm of unspecified part of unspecified bronchus or lung: Secondary | ICD-10-CM

## 2023-06-21 DIAGNOSIS — R530 Neoplastic (malignant) related fatigue: Secondary | ICD-10-CM

## 2023-06-21 MED ORDER — HYDROCODONE-ACETAMINOPHEN 5-325 MG PO TABS: 1.0000 | ORAL_TABLET | Freq: Four times a day (QID) | ORAL | 0 refills | Status: DC | PRN

## 2023-06-21 NOTE — Telephone Encounter (Signed)
 Pt husband called to cancel palliative appt d/t pt feeling bad, pt c/o nausea and diarrhea and fever x2 days with improvement today and no fever. Pt husband instructed to continue with PRN medications and a IVF appt was made for 06/23/23. Pt husband verbalized understanding and no further needs at this time.

## 2023-06-23 ENCOUNTER — Other Ambulatory Visit: Payer: Commercial Managed Care - HMO

## 2023-06-23 ENCOUNTER — Inpatient Hospital Stay: Payer: Medicaid Other

## 2023-06-23 ENCOUNTER — Inpatient Hospital Stay: Payer: Medicaid Other | Attending: Hematology

## 2023-06-23 VITALS — BP 119/77 | HR 76 | Temp 97.9°F | Resp 16 | Wt 94.5 lb

## 2023-06-23 DIAGNOSIS — G893 Neoplasm related pain (acute) (chronic): Secondary | ICD-10-CM | POA: Diagnosis not present

## 2023-06-23 DIAGNOSIS — D649 Anemia, unspecified: Secondary | ICD-10-CM | POA: Insufficient documentation

## 2023-06-23 DIAGNOSIS — R110 Nausea: Secondary | ICD-10-CM

## 2023-06-23 DIAGNOSIS — R530 Neoplastic (malignant) related fatigue: Secondary | ICD-10-CM

## 2023-06-23 DIAGNOSIS — R112 Nausea with vomiting, unspecified: Secondary | ICD-10-CM | POA: Insufficient documentation

## 2023-06-23 DIAGNOSIS — E43 Unspecified severe protein-calorie malnutrition: Secondary | ICD-10-CM | POA: Insufficient documentation

## 2023-06-23 DIAGNOSIS — Z8615 Personal history of latent tuberculosis infection: Secondary | ICD-10-CM | POA: Insufficient documentation

## 2023-06-23 DIAGNOSIS — C3490 Malignant neoplasm of unspecified part of unspecified bronchus or lung: Secondary | ICD-10-CM | POA: Insufficient documentation

## 2023-06-23 DIAGNOSIS — C419 Malignant neoplasm of bone and articular cartilage, unspecified: Secondary | ICD-10-CM

## 2023-06-23 DIAGNOSIS — C7951 Secondary malignant neoplasm of bone: Secondary | ICD-10-CM | POA: Insufficient documentation

## 2023-06-23 DIAGNOSIS — R630 Anorexia: Secondary | ICD-10-CM

## 2023-06-23 DIAGNOSIS — R634 Abnormal weight loss: Secondary | ICD-10-CM

## 2023-06-23 DIAGNOSIS — Z95828 Presence of other vascular implants and grafts: Secondary | ICD-10-CM

## 2023-06-23 LAB — CMP (CANCER CENTER ONLY)
ALT: 32 U/L (ref 0–44)
AST: 40 U/L (ref 15–41)
Albumin: 4.1 g/dL (ref 3.5–5.0)
Alkaline Phosphatase: 76 U/L (ref 38–126)
Anion gap: 8 (ref 5–15)
BUN: 17 mg/dL (ref 6–20)
CO2: 26 mmol/L (ref 22–32)
Calcium: 9.7 mg/dL (ref 8.9–10.3)
Chloride: 106 mmol/L (ref 98–111)
Creatinine: 0.8 mg/dL (ref 0.44–1.00)
GFR, Estimated: >60 mL/min (ref >60)
Glucose, Bld: 111 mg/dL — ABNORMAL HIGH (ref 70–99)
Potassium: 3.2 mmol/L — ABNORMAL LOW (ref 3.5–5.1)
Sodium: 140 mmol/L (ref 135–145)
Total Bilirubin: 0.3 mg/dL (ref 0.0–1.2)
Total Protein: 7.1 g/dL (ref 6.5–8.1)

## 2023-06-23 LAB — CBC WITH DIFFERENTIAL (CANCER CENTER ONLY)
Abs Immature Granulocytes: 0.01 10*3/uL (ref 0.00–0.07)
Basophils Absolute: 0 10*3/uL (ref 0.0–0.1)
Basophils Relative: 0 %
Eosinophils Absolute: 0.1 10*3/uL (ref 0.0–0.5)
Eosinophils Relative: 1 %
HCT: 32.7 % — ABNORMAL LOW (ref 36.0–46.0)
Hemoglobin: 10.9 g/dL — ABNORMAL LOW (ref 12.0–15.0)
Immature Granulocytes: 0 %
Lymphocytes Relative: 32 %
Lymphs Abs: 2.3 10*3/uL (ref 0.7–4.0)
MCH: 30.6 pg (ref 26.0–34.0)
MCHC: 33.3 g/dL (ref 30.0–36.0)
MCV: 91.9 fL (ref 80.0–100.0)
Monocytes Absolute: 0.3 10*3/uL (ref 0.1–1.0)
Monocytes Relative: 5 %
Neutro Abs: 4.4 10*3/uL (ref 1.7–7.7)
Neutrophils Relative %: 62 %
Platelet Count: 249 10*3/uL (ref 150–400)
RBC: 3.56 MIL/uL — ABNORMAL LOW (ref 3.87–5.11)
RDW: 13.6 % (ref 11.5–15.5)
WBC Count: 7.1 10*3/uL (ref 4.0–10.5)
nRBC: 0 % (ref 0.0–0.2)

## 2023-06-23 LAB — MAGNESIUM: Magnesium: 1.6 mg/dL — ABNORMAL LOW (ref 1.7–2.4)

## 2023-06-23 MED ORDER — SODIUM CHLORIDE 0.9 % IV SOLN: Freq: Once | INTRAVENOUS | Status: AC

## 2023-06-23 MED ORDER — HEPARIN SOD (PORK) LOCK FLUSH 100 UNIT/ML IV SOLN
500.0000 [IU] | Freq: Once | INTRAVENOUS | Status: AC | PRN
  Administered 2023-06-23: 500 [IU]

## 2023-06-23 MED ORDER — SODIUM CHLORIDE 0.9% FLUSH
10.0000 mL | Freq: Once | INTRAVENOUS | Status: AC | PRN
  Administered 2023-06-23: 10 mL

## 2023-06-23 MED ORDER — MAGNESIUM SULFATE 2 GM/50ML IV SOLN
2.0000 g | Freq: Once | INTRAVENOUS | Status: AC
  Administered 2023-06-23: 2 g via INTRAVENOUS
  Filled 2023-06-23: qty 50

## 2023-06-23 MED ORDER — POTASSIUM CHLORIDE 10 MEQ/100ML IV SOLN
10.0000 meq | Freq: Once | INTRAVENOUS | Status: AC
Start: 2023-06-23 — End: 2023-06-23
  Administered 2023-06-23: 10 meq via INTRAVENOUS
  Filled 2023-06-23: qty 100

## 2023-06-23 MED ORDER — ONDANSETRON HCL 4 MG/2ML IJ SOLN
4.0000 mg | Freq: Once | INTRAMUSCULAR | Status: AC
Start: 2023-06-23 — End: 2023-06-23
  Administered 2023-06-23: 4 mg via INTRAVENOUS
  Filled 2023-06-23: qty 2

## 2023-06-23 NOTE — Patient Instructions (Signed)

## 2023-06-24 ENCOUNTER — Other Ambulatory Visit: Payer: Self-pay

## 2023-06-24 MED ORDER — CLONAZEPAM 1 MG PO TABS: 1.0000 mg | ORAL_TABLET | Freq: Two times a day (BID) | ORAL | 0 refills | Status: DC

## 2023-06-27 ENCOUNTER — Other Ambulatory Visit: Payer: Self-pay

## 2023-06-27 NOTE — Progress Notes (Signed)
 Specialty Pharmacy Refill Coordination Note  Karen [name] is a 58 y.o. female contacted today regarding refills of specialty medication(s) Dabrafenib Mesylate  (TAFINLAR ); Trametinib  Dimethyl Sulfoxide  (MEKINIST )   Patient requested Pickup at [facility] at [place] date: 06/30/23   Medication will be filled on 06/29/23.

## 2023-06-29 ENCOUNTER — Other Ambulatory Visit (HOSPITAL_COMMUNITY): Payer: Self-pay

## 2023-06-29 ENCOUNTER — Other Ambulatory Visit: Payer: Self-pay

## 2023-06-29 ENCOUNTER — Encounter: Payer: Self-pay | Admitting: Hematology

## 2023-06-30 ENCOUNTER — Other Ambulatory Visit: Payer: Self-pay

## 2023-07-01 ENCOUNTER — Other Ambulatory Visit (HOSPITAL_COMMUNITY): Payer: Self-pay

## 2023-07-01 ENCOUNTER — Other Ambulatory Visit: Payer: Self-pay

## 2023-07-04 ENCOUNTER — Other Ambulatory Visit (HOSPITAL_COMMUNITY): Payer: Self-pay

## 2023-07-04 NOTE — Progress Notes (Signed)
 Oral Oncology Patient Advocate Encounter  Called and spoke with patient's husband on 06/30/23 to discuss insurance reject from medicaid that the primary payer was still active. Husband confirmed the patient;s only active plan should be Medicaid. I advised he would need to call and have that updated with her plan. Husband agreed.   Rx cannot be processed until this is corrected with medicaid.  Estefana Moellers, CPhT-Adv Oncology Pharmacy Patient Advocate [facility] Direct Number: [phone]  Fax: [phone]

## 2023-07-05 ENCOUNTER — Other Ambulatory Visit (HOSPITAL_COMMUNITY): Payer: Self-pay

## 2023-07-05 ENCOUNTER — Encounter: Payer: Self-pay | Admitting: Hematology

## 2023-07-05 ENCOUNTER — Other Ambulatory Visit: Payer: Self-pay

## 2023-07-06 ENCOUNTER — Encounter: Payer: Self-pay | Admitting: Hematology

## 2023-07-06 ENCOUNTER — Other Ambulatory Visit (HOSPITAL_COMMUNITY): Payer: Self-pay

## 2023-07-06 ENCOUNTER — Other Ambulatory Visit: Payer: Self-pay

## 2023-07-07 ENCOUNTER — Encounter: Payer: Self-pay | Admitting: Physician Assistant

## 2023-07-07 ENCOUNTER — Other Ambulatory Visit: Payer: Self-pay

## 2023-07-07 ENCOUNTER — Ambulatory Visit: Payer: Medicaid Other | Admitting: Physician Assistant

## 2023-07-07 ENCOUNTER — Other Ambulatory Visit: Payer: Medicaid Other

## 2023-07-07 ENCOUNTER — Other Ambulatory Visit (HOSPITAL_COMMUNITY): Payer: Self-pay

## 2023-07-07 VITALS — BP 98/60 | HR 84 | Ht 61.0 in | Wt 96.4 lb

## 2023-07-07 DIAGNOSIS — E46 Unspecified protein-calorie malnutrition: Secondary | ICD-10-CM

## 2023-07-07 DIAGNOSIS — R110 Nausea: Secondary | ICD-10-CM

## 2023-07-07 DIAGNOSIS — R1012 Left upper quadrant pain: Secondary | ICD-10-CM

## 2023-07-07 DIAGNOSIS — C7800 Secondary malignant neoplasm of unspecified lung: Secondary | ICD-10-CM

## 2023-07-07 DIAGNOSIS — R1013 Epigastric pain: Secondary | ICD-10-CM

## 2023-07-07 LAB — CBC WITH DIFFERENTIAL/PLATELET
Basophils Absolute: 0 10*3/uL (ref 0.0–0.1)
Basophils Relative: 0.6 % (ref 0.0–3.0)
Eosinophils Absolute: 0.1 10*3/uL (ref 0.0–0.7)
Eosinophils Relative: 1 % (ref 0.0–5.0)
HCT: 33.8 % — ABNORMAL LOW (ref 36.0–46.0)
Hemoglobin: 11.4 g/dL — ABNORMAL LOW (ref 12.0–15.0)
Lymphocytes Relative: 22.3 % (ref 12.0–46.0)
Lymphs Abs: 1.3 10*3/uL (ref 0.7–4.0)
MCHC: 33.8 g/dL (ref 30.0–36.0)
MCV: 92.3 fL (ref 78.0–100.0)
Monocytes Absolute: 0.5 10*3/uL (ref 0.1–1.0)
Monocytes Relative: 8.3 % (ref 3.0–12.0)
Neutro Abs: 4 10*3/uL (ref 1.4–7.7)
Neutrophils Relative %: 67.8 % (ref 43.0–77.0)
Platelets: 241 10*3/uL (ref 150.0–400.0)
RBC: 3.66 Mil/uL — ABNORMAL LOW (ref 3.87–5.11)
RDW: 14.9 % (ref 11.5–15.5)
WBC: 5.9 10*3/uL (ref 4.0–10.5)

## 2023-07-07 LAB — COMPREHENSIVE METABOLIC PANEL
ALT: 19 U/L (ref 0–35)
AST: 24 U/L (ref 0–37)
Albumin: 4.2 g/dL (ref 3.5–5.2)
Alkaline Phosphatase: 60 U/L (ref 39–117)
BUN: 12 mg/dL (ref 6–23)
CO2: 29 meq/L (ref 19–32)
Calcium: 9.4 mg/dL (ref 8.4–10.5)
Chloride: 103 meq/L (ref 96–112)
Creatinine, Ser: 0.86 mg/dL (ref 0.40–1.20)
GFR: 74.89 mL/min (ref >60.00)
Glucose, Bld: 96 mg/dL (ref 70–99)
Potassium: 3.8 meq/L (ref 3.5–5.1)
Sodium: 139 meq/L (ref 135–145)
Total Bilirubin: 0.4 mg/dL (ref 0.2–1.2)
Total Protein: 6.9 g/dL (ref 6.0–8.3)

## 2023-07-07 LAB — LIPASE: Lipase: 39 U/L (ref 11.0–59.0)

## 2023-07-07 MED ORDER — SUCRALFATE 1 GM/10ML PO SUSP: 1.0000 g | Freq: Three times a day (TID) | ORAL | 1 refills | Status: DC

## 2023-07-07 NOTE — Patient Instructions (Signed)
_______________________________________________________  If your blood pressure at your visit was 140/90 or greater, please contact your primary care physician to follow up on this.  _______________________________________________________  If you are age 58 or older, your body mass index should be between 23-30. Your Body mass index is 18.21 kg/m. If this is out of the aforementioned range listed, please consider follow up with your Primary Care Provider.  If you are age 23 or younger, your body mass index should be between 19-25. Your Body mass index is 18.21 kg/m. If this is out of the aformentioned range listed, please consider follow up with your Primary Care Provider.   ________________________________________________________  The [facility] providers would like to encourage you to use [facility] to communicate with providers for non-urgent requests or questions.  Due to long hold times on the telephone, sending your provider a message by [facility] may be a faster and more efficient way to get a response.  Please allow 48 business hours for a response.  Please remember that this is for non-urgent requests.  _______________________________________________________  Your provider has requested that you go to the basement level for lab work before leaving today. Press "B" on the elevator. The lab is located at the first door on the left as you exit the elevator.  You have been scheduled for a CT scan of the abdomen and pelvis at [facility], 1st floor Radiology. You are scheduled on 07-15-23 at 5pm. You should arrive at 2:45pm for registration and to drink contrast at 3pm and 4 pm.   Please follow the written instructions below on the day of your exam:   1) Do not eat anything after 12pm (4 hours prior to your test)   You may take any medications as prescribed with a small amount of water, if necessary. If you take any of the following medications: METFORMIN, GLUCOPHAGE, GLUCOVANCE,  AVANDAMET, RIOMET, FORTAMET, ACTOPLUS MET, JANUMET, GLUMETZA or METAGLIP, you MAY be asked to HOLD this medication 48 hours AFTER the exam.   The purpose of you drinking the oral contrast is to aid in the visualization of your intestinal tract. The contrast solution may cause some diarrhea. Depending on your individual set of symptoms, you may also receive an intravenous injection of x-ray contrast/dye. Plan on being at [facility] for 45 minutes or longer, depending on the type of exam you are having performed.   If you have any questions regarding your exam or if you need to reschedule, you may call [facility] at [phone] between the hours of 8:00 am and 5:00 pm, Monday-Friday.   CONTINUE all same medications  We have sent the following medications to your pharmacy for you to pick up at your convenience:  START: Carafate liquid 1gm between meals and at bedtime  Due to recent changes in healthcare laws, you may see the results of your imaging and laboratory studies on MyChart before your provider has had a chance to review them.  We understand that in some cases there may be results that are confusing or concerning to you. Not all laboratory results come back in the same time frame and the provider may be waiting for multiple results in order to interpret others.  Please give [place] 48 hours in order for your provider to thoroughly review all the results before contacting the office for clarification of your results.   Thank you for entrusting me with your care and choosing [facility].  Amy Esterwood, PA-C

## 2023-07-07 NOTE — Progress Notes (Signed)
Husband, Remigio Eisenmenger & [employer] representative called to reprocess claims for [employer]. Both prescriptions went through for $4.00 each. [employer] representative confirmed this copay is correct. Specialty Pharmacy will order medications today and courier to [facility] tomorrow, 07/08/23 for pick up. Husband Remigio Eisenmenger expressed understanding.

## 2023-07-08 ENCOUNTER — Other Ambulatory Visit: Payer: Self-pay

## 2023-07-10 ENCOUNTER — Encounter: Payer: Self-pay | Admitting: Physician Assistant
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
## 2023-07-12 ENCOUNTER — Ambulatory Visit: Payer: Commercial Managed Care - HMO | Admitting: Neurology

## 2023-07-12 ENCOUNTER — Telehealth: Payer: Self-pay | Admitting: Neurology

## 2023-07-12 NOTE — Telephone Encounter (Signed)
Pt's spouse, Marcello Fennel cancelled appointment for today due to patient had a bad fall.

## 2023-07-15 ENCOUNTER — Ambulatory Visit (HOSPITAL_COMMUNITY): Payer: Medicaid Other

## 2023-07-18 ENCOUNTER — Other Ambulatory Visit: Payer: Self-pay

## 2023-07-18 DIAGNOSIS — C419 Malignant neoplasm of bone and articular cartilage, unspecified: Secondary | ICD-10-CM

## 2023-07-18 DIAGNOSIS — C3490 Malignant neoplasm of unspecified part of unspecified bronchus or lung: Secondary | ICD-10-CM

## 2023-07-19 ENCOUNTER — Inpatient Hospital Stay: Payer: Medicaid Other

## 2023-07-19 ENCOUNTER — Inpatient Hospital Stay (HOSPITAL_BASED_OUTPATIENT_CLINIC_OR_DEPARTMENT_OTHER): Payer: Medicaid Other | Admitting: Hematology

## 2023-07-19 VITALS — BP 94/61 | HR 75 | Temp 97.0°F | Resp 17 | Wt 95.6 lb

## 2023-07-19 DIAGNOSIS — R112 Nausea with vomiting, unspecified: Secondary | ICD-10-CM | POA: Diagnosis not present

## 2023-07-19 DIAGNOSIS — C3490 Malignant neoplasm of unspecified part of unspecified bronchus or lung: Secondary | ICD-10-CM

## 2023-07-19 DIAGNOSIS — C419 Malignant neoplasm of bone and articular cartilage, unspecified: Secondary | ICD-10-CM

## 2023-07-19 LAB — CBC WITH DIFFERENTIAL (CANCER CENTER ONLY)
Abs Immature Granulocytes: 0.01 10*3/uL (ref 0.00–0.07)
Basophils Absolute: 0 10*3/uL (ref 0.0–0.1)
Basophils Relative: 0 %
Eosinophils Absolute: 0.1 10*3/uL (ref 0.0–0.5)
Eosinophils Relative: 2 %
HCT: 33 % — ABNORMAL LOW (ref 36.0–46.0)
Hemoglobin: 11.1 g/dL — ABNORMAL LOW (ref 12.0–15.0)
Immature Granulocytes: 0 %
Lymphocytes Relative: 40 %
Lymphs Abs: 2.4 10*3/uL (ref 0.7–4.0)
MCH: 30.3 pg (ref 26.0–34.0)
MCHC: 33.6 g/dL (ref 30.0–36.0)
MCV: 90.2 fL (ref 80.0–100.0)
Monocytes Absolute: 0.3 10*3/uL (ref 0.1–1.0)
Monocytes Relative: 6 %
Neutro Abs: 3.1 10*3/uL (ref 1.7–7.7)
Neutrophils Relative %: 52 %
Platelet Count: 316 10*3/uL (ref 150–400)
RBC: 3.66 MIL/uL — ABNORMAL LOW (ref 3.87–5.11)
RDW: 13.6 % (ref 11.5–15.5)
WBC Count: 6 10*3/uL (ref 4.0–10.5)
nRBC: 0 % (ref 0.0–0.2)

## 2023-07-19 LAB — CMP (CANCER CENTER ONLY)
ALT: 27 U/L (ref 0–44)
AST: 34 U/L (ref 15–41)
Albumin: 4.1 g/dL (ref 3.5–5.0)
Alkaline Phosphatase: 94 U/L (ref 38–126)
Anion gap: 6 (ref 5–15)
BUN: 16 mg/dL (ref 6–20)
CO2: 30 mmol/L (ref 22–32)
Calcium: 10 mg/dL (ref 8.9–10.3)
Chloride: 105 mmol/L (ref 98–111)
Creatinine: 0.91 mg/dL (ref 0.44–1.00)
GFR, Estimated: >60 mL/min (ref >60)
Glucose, Bld: 82 mg/dL (ref 70–99)
Potassium: 4.1 mmol/L (ref 3.5–5.1)
Sodium: 141 mmol/L (ref 135–145)
Total Bilirubin: 0.3 mg/dL (ref 0.0–1.2)
Total Protein: 7 g/dL (ref 6.5–8.1)

## 2023-07-19 LAB — MAGNESIUM: Magnesium: 1.9 mg/dL (ref 1.7–2.4)
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
## 2023-07-25 ENCOUNTER — Telehealth: Payer: Self-pay | Admitting: Student

## 2023-07-25 ENCOUNTER — Encounter: Payer: Self-pay | Admitting: Hematology

## 2023-07-25 MED ORDER — CLONAZEPAM 1 MG PO TABS: 1.0000 mg | ORAL_TABLET | Freq: Two times a day (BID) | ORAL | 3 refills | Status: DC

## 2023-07-25 NOTE — Telephone Encounter (Signed)
clonazePAM (KLONOPIN) 1 MG tablet   [id number] - [place], [place] - [address]

## 2023-07-26 ENCOUNTER — Inpatient Hospital Stay: Payer: Medicaid Other | Attending: Hematology | Admitting: Nurse Practitioner

## 2023-07-26 ENCOUNTER — Other Ambulatory Visit: Payer: Self-pay

## 2023-07-26 ENCOUNTER — Encounter: Payer: Self-pay | Admitting: Nurse Practitioner

## 2023-07-26 ENCOUNTER — Telehealth: Payer: Self-pay | Admitting: Dietician

## 2023-07-26 VITALS — BP 111/71 | HR 88 | Temp 98.1°F | Resp 16 | Wt 98.1 lb

## 2023-07-26 DIAGNOSIS — R630 Anorexia: Secondary | ICD-10-CM

## 2023-07-26 DIAGNOSIS — R530 Neoplastic (malignant) related fatigue: Secondary | ICD-10-CM | POA: Diagnosis not present

## 2023-07-26 DIAGNOSIS — G893 Neoplasm related pain (acute) (chronic): Secondary | ICD-10-CM | POA: Diagnosis not present

## 2023-07-26 DIAGNOSIS — Z515 Encounter for palliative care: Secondary | ICD-10-CM

## 2023-07-26 DIAGNOSIS — C419 Malignant neoplasm of bone and articular cartilage, unspecified: Secondary | ICD-10-CM

## 2023-07-26 NOTE — Telephone Encounter (Signed)
Scheduled appointment per 2/4 scheduling message. Called the patient and spoke to the patients husband through the interpreter service. Patient is active on MyChart

## 2023-07-28 ENCOUNTER — Other Ambulatory Visit: Payer: Self-pay | Admitting: Student

## 2023-07-29 ENCOUNTER — Other Ambulatory Visit: Payer: Self-pay | Admitting: Physician Assistant

## 2023-08-01 ENCOUNTER — Other Ambulatory Visit: Payer: Self-pay

## 2023-08-01 ENCOUNTER — Other Ambulatory Visit (HOSPITAL_COMMUNITY): Payer: Self-pay

## 2023-08-01 ENCOUNTER — Other Ambulatory Visit: Payer: Self-pay | Admitting: Nurse Practitioner

## 2023-08-01 MED ORDER — SUMATRIPTAN SUCCINATE 50 MG PO TABS
50.0000 mg | ORAL_TABLET | ORAL | 3 refills | Status: AC
  Filled 2023-08-01: qty 12, 23d supply, fill #0
  Filled 2023-11-04: qty 12, 23d supply, fill #1
  Filled 2024-01-26: qty 12, 23d supply, fill #2
  Filled 2024-03-23: qty 12, 23d supply, fill #3

## 2023-08-02 ENCOUNTER — Other Ambulatory Visit: Payer: Self-pay

## 2023-08-02 ENCOUNTER — Other Ambulatory Visit (HOSPITAL_COMMUNITY): Payer: Self-pay

## 2023-08-02 ENCOUNTER — Other Ambulatory Visit: Payer: Self-pay | Admitting: Hematology

## 2023-08-02 DIAGNOSIS — C3490 Malignant neoplasm of unspecified part of unspecified bronchus or lung: Secondary | ICD-10-CM

## 2023-08-02 MED ORDER — DABRAFENIB MESYLATE 75 MG PO CAPS
150.0000 mg | ORAL_CAPSULE | Freq: Two times a day (BID) | ORAL | 2 refills | Status: DC
  Filled 2023-08-02: qty 120, 30d supply, fill #0
  Filled 2023-09-08: qty 120, 30d supply, fill #1
  Filled 2023-10-12: qty 120, 30d supply, fill #2

## 2023-08-02 MED ORDER — TRAMETINIB DIMETHYL SULFOXIDE 2 MG PO TABS
2.0000 mg | ORAL_TABLET | Freq: Every day | ORAL | 2 refills | Status: DC
  Filled 2023-08-02: qty 30, 30d supply, fill #0
  Filled 2023-09-08: qty 30, 30d supply, fill #1
  Filled 2023-10-11 – 2023-10-12 (×2): qty 30, 30d supply, fill #2

## 2023-08-02 NOTE — Progress Notes (Signed)
Specialty Pharmacy Refill Coordination Note  Karen [name] is a 58 y.o. female, patients husband was contacted today regarding refills of specialty medication(s) Dabrafenib Mesylate (TAFINLAR); Trametinib Dimethyl Sulfoxide (MEKINIST)   Patient requested Pickup at [facility] at [facility] date: 08/05/23   Medication will be filled on 08/05/23.   This fill date is pending response to refill request from provider. Patient is aware and if they have not received fill by intended date they must follow up with pharmacy.

## 2023-08-04 ENCOUNTER — Ambulatory Visit (HOSPITAL_COMMUNITY)
Admission: RE | Admit: 2023-08-04 | Discharge: 2023-08-04 | Disposition: A | Discharge status: Home / Self Care | Payer: Medicaid Other | Source: Ambulatory Visit | Attending: Hematology | Admitting: Hematology

## 2023-08-04 ENCOUNTER — Other Ambulatory Visit: Payer: Self-pay

## 2023-08-04 DIAGNOSIS — C3490 Malignant neoplasm of unspecified part of unspecified bronchus or lung: Secondary | ICD-10-CM

## 2023-08-04 DIAGNOSIS — C419 Malignant neoplasm of bone and articular cartilage, unspecified: Secondary | ICD-10-CM

## 2023-08-04 MED ORDER — IOHEXOL 300 MG/ML  SOLN
30.0000 mL | Freq: Once | INTRAMUSCULAR | Status: AC | PRN
  Administered 2023-08-04: 30 mL via ORAL

## 2023-08-04 MED ORDER — GADOBUTROL 1 MMOL/ML IV SOLN
4.0000 mL | Freq: Once | INTRAVENOUS | Status: AC | PRN
Start: 2023-08-04 — End: 2023-08-04
  Administered 2023-08-04: 4 mL via INTRAVENOUS

## 2023-08-04 MED ORDER — IOHEXOL 300 MG/ML  SOLN
100.0000 mL | Freq: Once | INTRAMUSCULAR | Status: AC | PRN
  Administered 2023-08-04: 100 mL via INTRAVENOUS

## 2023-08-10 ENCOUNTER — Inpatient Hospital Stay: Payer: Medicaid Other | Admitting: Dietician

## 2023-08-10 ENCOUNTER — Other Ambulatory Visit: Payer: Self-pay | Admitting: Nurse Practitioner

## 2023-08-10 NOTE — Progress Notes (Signed)
Patient did not show for scheduled nutrition appointment.

## 2023-08-11 ENCOUNTER — Encounter: Payer: Self-pay | Admitting: Hematology

## 2023-08-11 ENCOUNTER — Other Ambulatory Visit (HOSPITAL_COMMUNITY): Payer: Self-pay

## 2023-08-11 ENCOUNTER — Other Ambulatory Visit: Payer: Self-pay | Admitting: Nurse Practitioner

## 2023-08-11 DIAGNOSIS — Z515 Encounter for palliative care: Secondary | ICD-10-CM

## 2023-08-11 DIAGNOSIS — R110 Nausea: Secondary | ICD-10-CM

## 2023-08-11 DIAGNOSIS — C3490 Malignant neoplasm of unspecified part of unspecified bronchus or lung: Secondary | ICD-10-CM

## 2023-08-11 MED ORDER — CYCLOBENZAPRINE HCL 5 MG PO TABS
5.0000 mg | ORAL_TABLET | Freq: Three times a day (TID) | ORAL | 2 refills | Status: DC | PRN
  Filled 2023-08-11: qty 30, 10d supply, fill #0

## 2023-08-11 MED ORDER — ONDANSETRON HCL 4 MG PO TABS
4.0000 mg | ORAL_TABLET | Freq: Four times a day (QID) | ORAL | 6 refills | Status: DC
  Filled 2023-08-11: qty 30, 8d supply, fill #0
  Filled 2023-09-08: qty 30, 8d supply, fill #1

## 2023-08-11 MED ORDER — DEXAMETHASONE 1 MG PO TABS
1.0000 mg | ORAL_TABLET | Freq: Every day | ORAL | 2 refills | Status: DC
  Filled 2023-08-11: qty 30, 30d supply, fill #0
  Filled 2023-11-04: qty 30, 30d supply, fill #1

## 2023-08-13 ENCOUNTER — Telehealth: Payer: Self-pay | Admitting: Hematology

## 2023-08-13 NOTE — Telephone Encounter (Signed)
 Patient does not have a voicemail box set up. I will send a MyChart message with updated information of Nutrition appointment.

## 2023-08-16 ENCOUNTER — Other Ambulatory Visit: Payer: Self-pay

## 2023-08-18 ENCOUNTER — Other Ambulatory Visit: Payer: Self-pay

## 2023-08-18 DIAGNOSIS — C3490 Malignant neoplasm of unspecified part of unspecified bronchus or lung: Secondary | ICD-10-CM

## 2023-08-22 ENCOUNTER — Inpatient Hospital Stay: Payer: Medicaid Other | Attending: Hematology

## 2023-08-22 ENCOUNTER — Inpatient Hospital Stay (HOSPITAL_BASED_OUTPATIENT_CLINIC_OR_DEPARTMENT_OTHER): Payer: Medicaid Other | Admitting: Hematology

## 2023-08-22 VITALS — BP 98/66 | HR 81 | Temp 97.8°F | Resp 16 | Wt 97.4 lb

## 2023-08-22 DIAGNOSIS — Z8673 Personal history of transient ischemic attack (TIA), and cerebral infarction without residual deficits: Secondary | ICD-10-CM | POA: Insufficient documentation

## 2023-08-22 DIAGNOSIS — F32A Depression, unspecified: Secondary | ICD-10-CM | POA: Insufficient documentation

## 2023-08-22 DIAGNOSIS — C7951 Secondary malignant neoplasm of bone: Secondary | ICD-10-CM | POA: Insufficient documentation

## 2023-08-22 DIAGNOSIS — Z7952 Long term (current) use of systemic steroids: Secondary | ICD-10-CM | POA: Insufficient documentation

## 2023-08-22 DIAGNOSIS — C3490 Malignant neoplasm of unspecified part of unspecified bronchus or lung: Secondary | ICD-10-CM

## 2023-08-22 DIAGNOSIS — C7931 Secondary malignant neoplasm of brain: Secondary | ICD-10-CM | POA: Diagnosis not present

## 2023-08-22 DIAGNOSIS — Z79899 Other long term (current) drug therapy: Secondary | ICD-10-CM | POA: Insufficient documentation

## 2023-08-22 DIAGNOSIS — F419 Anxiety disorder, unspecified: Secondary | ICD-10-CM | POA: Diagnosis not present

## 2023-08-22 DIAGNOSIS — G893 Neoplasm related pain (acute) (chronic): Secondary | ICD-10-CM | POA: Insufficient documentation

## 2023-08-22 DIAGNOSIS — Z95828 Presence of other vascular implants and grafts: Secondary | ICD-10-CM

## 2023-08-22 LAB — CBC WITH DIFFERENTIAL (CANCER CENTER ONLY)
Abs Immature Granulocytes: 0.02 10*3/uL (ref 0.00–0.07)
Basophils Absolute: 0 10*3/uL (ref 0.0–0.1)
Basophils Relative: 1 %
Eosinophils Absolute: 0.1 10*3/uL (ref 0.0–0.5)
Eosinophils Relative: 1 %
HCT: 32.3 % — ABNORMAL LOW (ref 36.0–46.0)
Hemoglobin: 10.5 g/dL — ABNORMAL LOW (ref 12.0–15.0)
Immature Granulocytes: 0 %
Lymphocytes Relative: 30 %
Lymphs Abs: 1.9 10*3/uL (ref 0.7–4.0)
MCH: 30.2 pg (ref 26.0–34.0)
MCHC: 32.5 g/dL (ref 30.0–36.0)
MCV: 92.8 fL (ref 80.0–100.0)
Monocytes Absolute: 0.3 10*3/uL (ref 0.1–1.0)
Monocytes Relative: 5 %
Neutro Abs: 4 10*3/uL (ref 1.7–7.7)
Neutrophils Relative %: 63 %
Platelet Count: 287 10*3/uL (ref 150–400)
RBC: 3.48 MIL/uL — ABNORMAL LOW (ref 3.87–5.11)
RDW: 13.1 % (ref 11.5–15.5)
WBC Count: 6.3 10*3/uL (ref 4.0–10.5)
nRBC: 0 % (ref 0.0–0.2)

## 2023-08-22 LAB — CMP (CANCER CENTER ONLY)
ALT: 19 U/L (ref 0–44)
AST: 27 U/L (ref 15–41)
Albumin: 3.9 g/dL (ref 3.5–5.0)
Alkaline Phosphatase: 73 U/L (ref 38–126)
Anion gap: 6 (ref 5–15)
BUN: 30 mg/dL — ABNORMAL HIGH (ref 6–20)
CO2: 29 mmol/L (ref 22–32)
Calcium: 9.4 mg/dL (ref 8.9–10.3)
Chloride: 104 mmol/L (ref 98–111)
Creatinine: 0.76 mg/dL (ref 0.44–1.00)
GFR, Estimated: >60 mL/min (ref >60)
Glucose, Bld: 142 mg/dL — ABNORMAL HIGH (ref 70–99)
Potassium: 3.6 mmol/L (ref 3.5–5.1)
Sodium: 139 mmol/L (ref 135–145)
Total Bilirubin: 0.3 mg/dL (ref 0.0–1.2)
Total Protein: 6.7 g/dL (ref 6.5–8.1)

## 2023-08-22 LAB — MAGNESIUM: Magnesium: 1.7 mg/dL (ref 1.7–2.4)

## 2023-08-22 MED ORDER — SODIUM CHLORIDE 0.9% FLUSH
10.0000 mL | Freq: Once | INTRAVENOUS | Status: AC
  Administered 2023-08-22: 10 mL

## 2023-08-22 MED ORDER — HEPARIN SOD (PORK) LOCK FLUSH 100 UNIT/ML IV SOLN
500.0000 [IU] | Freq: Once | INTRAVENOUS | Status: AC
  Administered 2023-08-22: 500 [IU]

## 2023-08-23 ENCOUNTER — Inpatient Hospital Stay: Payer: Medicaid Other | Admitting: Dietician

## 2023-08-23 NOTE — Progress Notes (Signed)
Patient did not show for nutrition appointment. This is the second missed appointment.

## 2023-08-24 ENCOUNTER — Other Ambulatory Visit: Payer: Self-pay | Admitting: Physician Assistant

## 2023-08-25 ENCOUNTER — Other Ambulatory Visit: Payer: Self-pay

## 2023-08-29 ENCOUNTER — Telehealth: Payer: Self-pay | Admitting: Dietician

## 2023-08-29 ENCOUNTER — Encounter: Payer: Self-pay | Admitting: Hematology
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
## 2023-08-29 NOTE — Telephone Encounter (Signed)
 Completed - Patient's appts are scheduled per [name].  Scheduled patient's appt to align with Palliative care appt on 3/19.

## 2023-08-30 ENCOUNTER — Other Ambulatory Visit: Payer: Self-pay | Admitting: Student

## 2023-08-30 NOTE — Telephone Encounter (Signed)
 Copied from CRM [id number]. Topic: Clinical - Medication Refill >> Aug 30, 2023  4:04 PM Prudencio Pair wrote: Most Recent Primary Care Visit:  Provider: Kathleen Lime  Department: [facility]  Visit Type: OPEN ESTABLISHED  Date: 05/09/2023  Medication: clonazePAM (KLONOPIN) 1 MG tablet   Has the patient contacted their pharmacy? No (Agent: If no, request that the patient contact the pharmacy for the refill. If patient does not wish to contact the pharmacy document the reason why and proceed with request.) (Agent: If yes, when and what did the pharmacy advise?)  Is this the correct pharmacy for this prescription? Yes If no, delete pharmacy and type the correct one.  This is the patient's preferred pharmacy:  [facility] [address] [place] - [address] [place] [address] Phone: [phone] Fax: [phone]   Has the prescription been filled recently? Yes  Is the patient out of the medication? Yes  Has the patient been seen for an appointment in the last year OR does the patient have an upcoming appointment? Yes  Can we respond through MyChart? Yes  Agent: Please be advised that Rx refills may take up to 3 business days. We ask that you follow-up with your pharmacy.

## 2023-09-05 MED ORDER — CLONAZEPAM 1 MG PO TABS: 1.0000 mg | ORAL_TABLET | Freq: Every day | ORAL | 0 refills | Status: DC
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
## 2023-09-07 ENCOUNTER — Encounter: Payer: Self-pay | Admitting: Nurse Practitioner

## 2023-09-07 ENCOUNTER — Inpatient Hospital Stay: Admitting: Dietician

## 2023-09-07 ENCOUNTER — Inpatient Hospital Stay (HOSPITAL_BASED_OUTPATIENT_CLINIC_OR_DEPARTMENT_OTHER): Admitting: Nurse Practitioner

## 2023-09-07 VITALS — BP 114/77 | HR 83 | Temp 97.3°F | Resp 17 | Ht 61.0 in | Wt 97.6 lb

## 2023-09-07 DIAGNOSIS — G893 Neoplasm related pain (acute) (chronic): Secondary | ICD-10-CM

## 2023-09-07 DIAGNOSIS — K5900 Constipation, unspecified: Secondary | ICD-10-CM

## 2023-09-07 DIAGNOSIS — C3490 Malignant neoplasm of unspecified part of unspecified bronchus or lung: Secondary | ICD-10-CM | POA: Diagnosis not present

## 2023-09-07 DIAGNOSIS — E876 Hypokalemia: Secondary | ICD-10-CM

## 2023-09-07 DIAGNOSIS — R530 Neoplastic (malignant) related fatigue: Secondary | ICD-10-CM

## 2023-09-07 DIAGNOSIS — Z515 Encounter for palliative care: Secondary | ICD-10-CM

## 2023-09-07 MED ORDER — HYDROCODONE-ACETAMINOPHEN 5-325 MG PO TABS: 1.0000 | ORAL_TABLET | Freq: Four times a day (QID) | ORAL | 0 refills | Status: DC | PRN

## 2023-09-07 MED ORDER — POTASSIUM CHLORIDE CRYS ER 20 MEQ PO TBCR: 20.0000 meq | EXTENDED_RELEASE_TABLET | Freq: Every day | ORAL | 2 refills | Status: DC

## 2023-09-07 MED ORDER — FOLIC ACID 1 MG PO TABS: 1.0000 mg | ORAL_TABLET | Freq: Every day | ORAL | 3 refills | Status: DC

## 2023-09-07 MED ORDER — SENNOSIDES-DOCUSATE SODIUM 8.6-50 MG PO TABS: 1.0000 | ORAL_TABLET | Freq: Two times a day (BID) | ORAL | 3 refills | Status: DC

## 2023-09-07 MED ORDER — POLYETHYLENE GLYCOL 3350 17 G PO PACK
17.0000 g | PACK | Freq: Two times a day (BID) | ORAL | 0 refills | Status: DC
Start: 2023-09-07 — End: 2023-10-12

## 2023-09-07 NOTE — Progress Notes (Signed)
 Nutrition Assessment   Reason for Assessment: Referral (wt loss)   ASSESSMENT: 58 year old female with primary adenocarcinoma of lung. S/p radiation. Pt is currently receiving oral Mekinist/Tafinlar. She is under the care of Dr. Candise Che  Past medical history includes HTN, migraines, IBS, PUD, depression, GERD, HLD, h/o dysphagia  Met with patient in office. Interpretor present at visit. Husband chose to stay in waiting area as he had to take a call. Pt reports poor appetite and taste changes. She is eating 2 meals most days. She only had broccoli cheddar soup, juice, and an Ensure Max yesterday. She does not like the taste of water at this time. Drinking ~1 bottle. She has chronic constipation. Pt takes miralax for this. Patient denies nausea, vomiting.   Nutrition Focused Physical Exam:   Orbital Region: moderate  Buccal Region: mild Temple Region: moderate Clavicle Bone Region: severe Shoulder and Acromion Bone Region: moderate Dorsal Hand: moderate Hair: reviewed  Eyes: reviewed  Mouth: reviewed  Skin: reviewed  Nails: reviewed   Medications: lipitor, klonopin, flexeril, decadron, folic acid, zofran, norco, miralax, klor-con, senna-s, zoloft, carafate, imitrex, trazodone   Labs: 3/3 - glucose 142, BUN 30   Anthropometrics:   Height: 5'1 Weight: 97 lb 9.6 oz  UBW: 115 lb (Jan 2024) BMI: 18.44   NUTRITION DIAGNOSIS: Food and nutrition related knowledge deficit related to cancer as evidenced by no prior need for associated nutrition information   MALNUTRITION DIAGNOSIS: Pt meets criteria for moderate malnutrition related to chronic disease as evidenced by mild/moderate fat and muscle depletion   INTERVENTION:  Discussed strategies for taste changes, suggested baking soda salt water rinses before meals - handout with tips + recipe Discussed strategies for poor appetite, recommend small frequent meals/snacks, eating by the clock, added calories/protein to foods Educated  on strategies for nausea, foods best tolerated and foods to avoid - handout provided Pt will work to increase intake of water - offered ideas on ways to add flavor Recommend 2 Ensure Complete/equivalent - samples + coupons    MONITORING, EVALUATION, GOAL: Pt will tolerate increased calories and protein to minimize further wt loss/promote wt gain   Next Visit: To be scheduled

## 2023-09-08 ENCOUNTER — Other Ambulatory Visit: Payer: Self-pay

## 2023-09-08 ENCOUNTER — Other Ambulatory Visit (HOSPITAL_COMMUNITY): Payer: Self-pay

## 2023-09-08 ENCOUNTER — Ambulatory Visit: Payer: Self-pay | Admitting: Student

## 2023-09-08 NOTE — Progress Notes (Signed)
 Specialty Pharmacy Ongoing Clinical Assessment Note  I spoke with the patient's husband.  Karen [name] is a 58 y.o. female who is being followed by the specialty pharmacy service for RxSp Oncology   Patient's specialty medication(s) reviewed today: Dabrafenib Mesylate (TAFINLAR); Trametinib Dimethyl Sulfoxide (MEKINIST)   Missed doses in the last 4 weeks: 1   Patient/Caregiver did not have any additional questions or concerns.   Therapeutic benefit summary: Patient is achieving benefit   Adverse events/side effects summary: No adverse events/side effects   Patient's therapy is appropriate to: Continue    Goals Addressed             This Visit's Progress    Slow Disease Progression       Patient is on track. Patient will maintain adherence. Per provider note from 08/22/23, recent CT and MRI show no evidence of disease progression at this time.         Follow up:  3 months  Servando Snare Specialty Pharmacist

## 2023-09-08 NOTE — Progress Notes (Signed)
 Specialty Pharmacy Refill Coordination Note  Karen [name] is a 58 y.o. female contacted today regarding refills of specialty medication(s) Dabrafenib Mesylate (TAFINLAR); Trametinib Dimethyl Sulfoxide (MEKINIST)   Patient requested Pickup at [facility] date: 09/09/23   Medication will be filled on 09/09/23.

## 2023-09-09 ENCOUNTER — Other Ambulatory Visit: Payer: Self-pay

## 2023-09-24 ENCOUNTER — Other Ambulatory Visit: Payer: Self-pay | Admitting: Neurology

## 2023-09-26 ENCOUNTER — Other Ambulatory Visit: Payer: Self-pay

## 2023-09-27 ENCOUNTER — Other Ambulatory Visit: Payer: Self-pay

## 2023-10-04 ENCOUNTER — Other Ambulatory Visit: Payer: Self-pay

## 2023-10-06 ENCOUNTER — Ambulatory Visit: Payer: Self-pay

## 2023-10-06 ENCOUNTER — Other Ambulatory Visit: Payer: Self-pay | Admitting: *Deleted

## 2023-10-06 ENCOUNTER — Telehealth: Payer: Self-pay | Admitting: Student

## 2023-10-06 NOTE — Telephone Encounter (Signed)
 Chief Complaint: Middle Back  Symptoms: Radiating to thigh Frequency: Years Pertinent Negatives: Patient denies fever, hematuria, abd pain Disposition: [] ED /[] Urgent Care (no appt availability in office) / [x] Appointment(In office/virtual)/ []  [facility]/ [] Home Care/ [] Refused Recommended Disposition /[] [facility]/ []  Follow-up with PCP Additional Notes: Pt reports she has been experiencing chronic mid back pain for many years, notes exacerbation currently as she is due for her yearly injection. Pt denies numbness, weakness, urinary symptoms, fever. OV scheduled. This RN educated pt on home care, new-worsening symptoms, when to call back/seek emergent care. Pt verbalized understanding and agrees to plan.     Copied from CRM [id number]. Topic: Clinical - Red Word Triage >> Oct 06, 2023 10:53 AM Karen [name] wrote: Red Word that prompted transfer to Nurse Triage: clonazePAM (KLONOPIN) 1 MG tablet [[id number]]  [name] Karen [name], [place] - [address] [name] [address] Phone: [phone]  Fax: [phone] DEA #: [id number] DAW Reason: -- Reason for Disposition  Back pain is a chronic symptom (recurrent or ongoing AND present > 4 weeks)  Answer Assessment - Initial Assessment Questions 1. ONSET: "When did the pain begin?"      "Years" 2. LOCATION: "Where does it hurt?" (upper, mid or lower back)     Mid back 3. SEVERITY: "How bad is the pain?"  (e.g., Scale 1-10; mild, moderate, or severe)   - MILD (1-3): Doesn't interfere with normal activities.    - MODERATE (4-7): Interferes with normal activities or awakens from sleep.    - SEVERE (8-10): Excruciating pain, unable to do any normal activities.      "More than 10" 4. PATTERN: "Is the pain constant?" (e.g., yes, no; constant, intermittent)      Constant 5. RADIATION: "Does the pain shoot into your legs or somewhere else?"     Right leg 8. MEDICINES: "What have you taken so  far for the pain?" (e.g., nothing, acetaminophen, NSAIDS)     No improvement, "has tried" 9. NEUROLOGIC SYMPTOMS: "Do you have any weakness, numbness, or problems with bowel/bladder control?"     None 10. OTHER SYMPTOMS: "Do you have any other symptoms?" (e.g., fever, abdomen pain, burning with urination, blood in urine)       None  Protocols used: Back Pain-A-AH

## 2023-10-06 NOTE — Telephone Encounter (Signed)
 Pt has an appt 4/21 with Dr Sharlon Deacon.

## 2023-10-06 NOTE — Telephone Encounter (Signed)
 Next appt scheduled 4/21 with Dr Sharlon Deacon.

## 2023-10-06 NOTE — Telephone Encounter (Signed)
 Copied from CRM [id number]. Topic: Clinical - Red Word Triage >> Oct 06, 2023 10:53 AM Tilton Fontan wrote: Red Word that prompted transfer to Nurse Triage: clonazePAM (KLONOPIN) 1 MG tablet [[id number]]  [id number], [place] - [address] [place] [address] Phone: [phone]  Fax: [phone] DEA #: [id number] DAW Reason: -- >> Oct 06, 2023 11:03 AM Tiffany H wrote: Call dropped.

## 2023-10-07 ENCOUNTER — Other Ambulatory Visit: Payer: Self-pay

## 2023-10-10 ENCOUNTER — Ambulatory Visit: Payer: Self-pay | Admitting: Student

## 2023-10-10 ENCOUNTER — Other Ambulatory Visit: Payer: Self-pay

## 2023-10-11 ENCOUNTER — Other Ambulatory Visit: Payer: Self-pay

## 2023-10-11 DIAGNOSIS — C3490 Malignant neoplasm of unspecified part of unspecified bronchus or lung: Secondary | ICD-10-CM

## 2023-10-12 ENCOUNTER — Other Ambulatory Visit (HOSPITAL_COMMUNITY): Payer: Self-pay

## 2023-10-12 ENCOUNTER — Other Ambulatory Visit: Payer: Self-pay

## 2023-10-12 ENCOUNTER — Inpatient Hospital Stay (HOSPITAL_BASED_OUTPATIENT_CLINIC_OR_DEPARTMENT_OTHER): Admitting: Nurse Practitioner

## 2023-10-12 ENCOUNTER — Encounter: Payer: Self-pay | Admitting: Nurse Practitioner

## 2023-10-12 ENCOUNTER — Other Ambulatory Visit: Payer: Self-pay | Admitting: Student

## 2023-10-12 ENCOUNTER — Other Ambulatory Visit: Payer: Self-pay | Admitting: Pharmacy Technician

## 2023-10-12 ENCOUNTER — Inpatient Hospital Stay: Attending: Hematology

## 2023-10-12 ENCOUNTER — Inpatient Hospital Stay (HOSPITAL_BASED_OUTPATIENT_CLINIC_OR_DEPARTMENT_OTHER): Admitting: Hematology

## 2023-10-12 ENCOUNTER — Encounter: Payer: Self-pay | Admitting: Hematology

## 2023-10-12 VITALS — BP 100/72 | HR 71 | Temp 97.6°F | Resp 16 | Wt 103.0 lb

## 2023-10-12 DIAGNOSIS — Z79899 Other long term (current) drug therapy: Secondary | ICD-10-CM | POA: Insufficient documentation

## 2023-10-12 DIAGNOSIS — C3490 Malignant neoplasm of unspecified part of unspecified bronchus or lung: Secondary | ICD-10-CM

## 2023-10-12 DIAGNOSIS — Z515 Encounter for palliative care: Secondary | ICD-10-CM | POA: Diagnosis not present

## 2023-10-12 DIAGNOSIS — G893 Neoplasm related pain (acute) (chronic): Secondary | ICD-10-CM | POA: Insufficient documentation

## 2023-10-12 DIAGNOSIS — K5903 Drug induced constipation: Secondary | ICD-10-CM | POA: Diagnosis not present

## 2023-10-12 DIAGNOSIS — Z7189 Other specified counseling: Secondary | ICD-10-CM

## 2023-10-12 DIAGNOSIS — C7951 Secondary malignant neoplasm of bone: Secondary | ICD-10-CM | POA: Insufficient documentation

## 2023-10-12 DIAGNOSIS — R110 Nausea: Secondary | ICD-10-CM

## 2023-10-12 LAB — CMP (CANCER CENTER ONLY)
ALT: 16 U/L (ref 0–44)
AST: 23 U/L (ref 15–41)
Albumin: 4 g/dL (ref 3.5–5.0)
Alkaline Phosphatase: 105 U/L (ref 38–126)
Anion gap: 4 — ABNORMAL LOW (ref 5–15)
BUN: 21 mg/dL — ABNORMAL HIGH (ref 6–20)
CO2: 29 mmol/L (ref 22–32)
Calcium: 9.3 mg/dL (ref 8.9–10.3)
Chloride: 107 mmol/L (ref 98–111)
Creatinine: 0.75 mg/dL (ref 0.44–1.00)
GFR, Estimated: >60 mL/min (ref >60)
Glucose, Bld: 93 mg/dL (ref 70–99)
Potassium: 4.6 mmol/L (ref 3.5–5.1)
Sodium: 140 mmol/L (ref 135–145)
Total Bilirubin: 0.2 mg/dL (ref 0.0–1.2)
Total Protein: 6.6 g/dL (ref 6.5–8.1)

## 2023-10-12 LAB — CBC WITH DIFFERENTIAL (CANCER CENTER ONLY)
Abs Immature Granulocytes: 0.01 10*3/uL (ref 0.00–0.07)
Basophils Absolute: 0 10*3/uL (ref 0.0–0.1)
Basophils Relative: 1 %
Eosinophils Absolute: 0.1 10*3/uL (ref 0.0–0.5)
Eosinophils Relative: 3 %
HCT: 30.6 % — ABNORMAL LOW (ref 36.0–46.0)
Hemoglobin: 10.2 g/dL — ABNORMAL LOW (ref 12.0–15.0)
Immature Granulocytes: 0 %
Lymphocytes Relative: 32 %
Lymphs Abs: 1.5 10*3/uL (ref 0.7–4.0)
MCH: 30.4 pg (ref 26.0–34.0)
MCHC: 33.3 g/dL (ref 30.0–36.0)
MCV: 91.1 fL (ref 80.0–100.0)
Monocytes Absolute: 0.4 10*3/uL (ref 0.1–1.0)
Monocytes Relative: 9 %
Neutro Abs: 2.7 10*3/uL (ref 1.7–7.7)
Neutrophils Relative %: 55 %
Platelet Count: 286 10*3/uL (ref 150–400)
RBC: 3.36 MIL/uL — ABNORMAL LOW (ref 3.87–5.11)
RDW: 12.7 % (ref 11.5–15.5)
WBC Count: 4.8 10*3/uL (ref 4.0–10.5)
nRBC: 0 % (ref 0.0–0.2)

## 2023-10-12 LAB — MAGNESIUM: Magnesium: 1.6 mg/dL — ABNORMAL LOW (ref 1.7–2.4)

## 2023-10-12 MED ORDER — HYDROCODONE-ACETAMINOPHEN 5-325 MG PO TABS
1.0000 | ORAL_TABLET | Freq: Four times a day (QID) | ORAL | 0 refills | Status: DC | PRN
  Filled 2023-10-12: qty 60, 8d supply, fill #0

## 2023-10-12 MED ORDER — ONDANSETRON HCL 4 MG PO TABS
4.0000 mg | ORAL_TABLET | Freq: Four times a day (QID) | ORAL | 6 refills | Status: DC
  Filled 2023-10-12: qty 30, 8d supply, fill #0
  Filled 2023-11-04: qty 30, 8d supply, fill #1
  Filled 2023-12-30: qty 30, 8d supply, fill #2

## 2023-10-12 MED ORDER — POLYETHYLENE GLYCOL 3350 17 GM/SCOOP PO POWD
17.0000 g | Freq: Two times a day (BID) | ORAL | 4 refills | Status: DC
  Filled 2023-10-12: qty 476, 14d supply, fill #0
  Filled 2023-10-12: qty 30, 15d supply, fill #0
  Filled 2023-11-04: qty 476, 14d supply, fill #1
  Filled 2024-01-26: qty 476, 14d supply, fill #2
  Filled 2024-03-23: qty 476, 14d supply, fill #3
  Filled 2024-04-06: qty 476, 14d supply, fill #4

## 2023-10-12 NOTE — Telephone Encounter (Signed)
  FYI->> Oct 12, 2023  3:26 PM Retta Caster wrote:  clonazePAM  (KLONOPIN ) 1 MG tablet [[id number]]-[id number] calling on update for medication refill due to out of medication. Over 3 day mark for response she stated. Call ed Nurse line and instructed to call office.  Office stated will put this as High Priority and will call back patient when updated [phone]       Copied from CRM [id number]. Topic: Clinical - Red Word Triage >> Oct 06, 2023 10:53 AM Tilton Fontan wrote: Red Word that prompted transfer to Nurse Triage: clonazePAM  (KLONOPIN ) 1 MG tablet [[id number]]  [id number] [place], [place] - [address] [place] [address] Phone: [phone]  Fax: [phone] DEA #: [id number] DAW Reason: -- >> Oct 12, 2023  3:26 PM Retta Caster wrote:  clonazePAM  (KLONOPIN ) 1 MG tablet [[id number] calling on update for medication refill due to out of medication. Over 3 day mark for response she stated. Call ed Nurse line and instructed to call office.  Office stated will put this as High Priority and will call back patient when updated [phone] >> Oct 06, 2023 11:03 AM Tiffany H wrote: Call dropped.

## 2023-10-12 NOTE — Telephone Encounter (Signed)
 I talked to pt's husband who stated pt does not need a refill on Klonopin ; he was asking about her cancer med in which he stated has been resolved.

## 2023-10-12 NOTE — Progress Notes (Signed)
 error

## 2023-10-12 NOTE — Addendum Note (Signed)
 Addended by: Havannah Streat on: 10/12/2023 03:25 PM   Modules accepted: Orders

## 2023-10-12 NOTE — Telephone Encounter (Signed)
 Copied from CRM [id number]. Topic: Clinical - Medication Refill >> Oct 12, 2023  4:19 PM Julie Oddi wrote: Most Recent Primary Care Visit:  Provider: Fay Hoop  Department: [facility]  Visit Type: OPEN ESTABLISHED  Date: 05/09/2023  Medication: clonazePAM  (KLONOPIN ) 1 MG tablet  Has the patient contacted their pharmacy? Yes (Agent: If no, request that the patient contact the pharmacy for the refill. If patient does not wish to contact the pharmacy document the reason why and proceed with request.) (Agent: If yes, when and what did the pharmacy advise?)  Is this the correct pharmacy for this prescription? Yes If no, delete pharmacy and type the correct one.  This is the patient's preferred pharmacy:  [facility] [address] [place] - [address] [place] [address] Phone: [phone] Fax: [phone]   Has the prescription been filled recently? No  Is the patient out of the medication? Yes  Has the patient been seen for an appointment in the last year OR does the patient have an upcoming appointment? Yes  Can we respond through MyChart? Yes  Agent: Please be advised that Rx refills may take up to 3 business days. We ask that you follow-up with your pharmacy.

## 2023-10-12 NOTE — Progress Notes (Signed)
 Specialty Pharmacy Refill Coordination Note  Karen [name] is a 58 y.o. female contacted today regarding refills of specialty medication(s) Dabrafenib Mesylate  (TAFINLAR ); Trametinib  Dimethyl Sulfoxide  (MEKINIST )   Patient requested [name] at [place] date: 10/13/23   Medication will be filled on 10/13/23.    Family Member aware we have to order Tafinlar  & message sent to Sandi.

## 2023-10-12 NOTE — Telephone Encounter (Signed)
 Copied from CRM [id number]. Topic: Clinical - Red Word Triage >> Oct 06, 2023 10:53 AM Tilton Fontan wrote: Red Word that prompted transfer to Nurse Triage: clonazePAM  (KLONOPIN ) 1 MG tablet [[id number]]  [id number] [place], [place] - [address] [place] [address] Phone: [phone]  Fax: [phone] DEA #: [id number] DAW Reason: -- >> Oct 12, 2023  3:26 PM Retta Caster wrote:  clonazePAM  (KLONOPIN ) 1 MG tablet [[id number] calling on update for medication refill due to out of medication. Over 3 day mark for response she stated. Call ed Nurse line and instructed to call office.  Office stated will put this as High Priority and will call back patient when updated [phone] >> Oct 06, 2023 11:03 AM Tiffany H wrote: Call dropped.

## 2023-10-12 NOTE — Telephone Encounter (Signed)
 Received a call from [facility], the patient is requesting a rx refill for clonazepam , the rx was not attached to the encounter. The medication has been attached now. The patient is completely out of the medication and is requesting the rx to be refilled asap. Please address.
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
## 2023-10-13 ENCOUNTER — Other Ambulatory Visit (HOSPITAL_COMMUNITY): Payer: Self-pay

## 2023-10-13 ENCOUNTER — Other Ambulatory Visit: Payer: Self-pay | Admitting: Student

## 2023-10-13 ENCOUNTER — Other Ambulatory Visit: Payer: Self-pay

## 2023-10-13 ENCOUNTER — Encounter: Payer: Self-pay | Admitting: Hematology
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
## 2023-10-13 NOTE — Telephone Encounter (Unsigned)
 Copied from CRM [id number]. Topic: Clinical - Medication Refill >> Oct 13, 2023  2:48 PM Retta Caster wrote: Most Recent Primary Care Visit:  Provider: Fay Hoop  Department: [facility]  Visit Type: OPEN ESTABLISHED  Date: 05/09/2023  Medication:  clonazePAM  (KLONOPIN ) 1 MG tablet  Has the patient contacted their pharmacy? Yes (Agent: If no, request that the patient contact the pharmacy for the refill. If patient does not wish to contact the pharmacy document the reason why and proceed with request.) (Agent: If yes, when and what did the pharmacy advise?)  Is this the correct pharmacy for this prescription? Yes If no, delete pharmacy and type the correct one.  This is the patient's preferred pharmacy:  [facility] [address] [place] - [address] [place] [address] Phone: [phone] Fax: [phone]   Has the prescription been filled recently? Yes  Is the patient out of the medication? Yes  Has the patient been seen for an appointment in the last year OR does the patient have an upcoming appointment? Yes  Can we respond through MyChart? Yes  Agent: Please be advised that Rx refills may take up to 3 business days. We ask that you follow-up with your pharmacy.

## 2023-10-14 ENCOUNTER — Ambulatory Visit: Payer: Self-pay | Admitting: *Deleted

## 2023-10-14 ENCOUNTER — Other Ambulatory Visit: Payer: Self-pay | Admitting: Student

## 2023-10-14 ENCOUNTER — Other Ambulatory Visit: Payer: Self-pay

## 2023-10-14 ENCOUNTER — Other Ambulatory Visit (HOSPITAL_COMMUNITY): Payer: Self-pay

## 2023-10-14 MED ORDER — CLONAZEPAM 1 MG PO TABS: 1.0000 mg | ORAL_TABLET | Freq: Every day | ORAL | 0 refills | Status: DC

## 2023-10-14 NOTE — Progress Notes (Signed)
 Mekinist  was inadvertently sent to pharmacy for pickup outside of its original container. When husband arrived for pickup he questioned this and also the need to refrigerate. Manufacturer has updated storage guidelines and medication no longer needs to be refrigerated. Prepared new bottle for pickup today in original container and spoke to husband to counsel that medication can now be stored at room temperature.

## 2023-10-14 NOTE — Telephone Encounter (Signed)
 [facility]am  was refilled on 3/19 for 60 tabs. I called pt who stated she has been taking 2 tabs a day instead of I tab daily; so , now she's out of medication. Stated she was confuse on the instructions, eye sight is bad. Stated she needs med today. Thanks

## 2023-10-14 NOTE — Telephone Encounter (Signed)
 Copied from CRM [id number]. Topic: Clinical - Medication Refill >> Oct 13, 2023  2:48 PM Retta Caster wrote: Most Recent Primary Care Visit:  Provider: Fay Hoop  Department[facility]  Visit Type: OPEN ESTABLISHED  Date: 05/09/2023  Medication:  clonazePAM  (KLONOPIN ) 1 MG tablet  Has the patient contacted their pharmacy? Yes (Agent: If no, request that the patient contact the pharmacy for the refill. If patient does not wish to contact the pharmacy document the reason why and proceed with request.) (Agent: If yes, when and what did the pharmacy advise?)  Is this the correct pharmacy for this prescription? Yes If no, delete pharmacy and type the correct one.  This is the patient's preferred pharmacy:  [facility] [address] [place] - [address] [place] [address] Phone: [phone] Fax: [phone]   Has the prescription been filled recently? Yes  Is the patient out of the medication? Yes  Has the patient been seen for an appointment in the last year OR does the patient have an upcoming appointment? Yes  Can we respond through MyChart? Yes  Agent: Please be advised that Rx refills may take up to 3 business days. We ask that you follow-up with your pharmacy. >> Oct 14, 2023 10:22 AM Tisa Forester wrote: Patient wanted to let provider know reason ran out of the medication so fast was taken 2 pills instead 1 pill , patient was confuse with taking the medication >> Oct 14, 2023 10:17 AM Tisa Forester wrote: Patient calling on the status of the medication refill for the  clonazePAM  (KLONOPIN ) 1 MG tablet . Patient is out of the medication , reason ran out medication so fast was taking 2 pills instead of 1 was confuse on instruction how to take,  patient access specialist let patient know it may take up to 3 business days and to check with the pharmacy on the status

## 2023-10-14 NOTE — Telephone Encounter (Signed)
 Interpreter: Bearl Limes: [id number]  Chief Complaint: panic attack- out of medication:   clonazePAM  (KLONOPIN ) 1 MG tablet   symptoms are worse Symptoms: Patient states she has run out of her medication and she is having increased symptoms of anxiety- heart palpatations, hands sweating, afraid Frequency: several days- out of medication 10/06/23 Pertinent Negatives: Patient denies suicidal thoughts Disposition: [] ED /[] Urgent Care (no appt availability in office) / [] Appointment(In office/virtual)/ []  [facility]/ [] Home Care/ [] Refused Recommended Disposition /[] [facility]/ [x]  Follow-up with PCP Additional Notes: Patient advised her medication is pending provider review . Patient advised no open appointmen ttoday- will send message to provider- care options given- ED/[facility]/UC . Please notifiy patient is she is able to get RF  Copied from CRM [id number]. Topic: Clinical - Red Word Triage >> Oct 14, 2023 10:24 AM Tisa Forester wrote: Red Word that prompted transfer to Nurse Triage: patient been experiencing panic attacks with heart palpitations, headaches   Reason for Disposition  [1] SEVERE anxiety (e.g., extremely anxious with intense emotional symptoms such as feeling of unreality, urge to flee, unable to calm down; unable to cope or function) AND [2] not better after 10 minutes of reassurance and Care Advice  Answer Assessment - Initial Assessment Questions 1. CONCERN: "Did anything happen that prompted you to call today?"      More frequent panic attacks- patient needs refill of Clonopin  2. ANXIETY SYMPTOMS: "Can you describe how you (your loved one; patient) have been feeling?" (e.g., tense, restless, panicky, anxious, keyed up, overwhelmed, sense of impending doom).      Heart palpitation, hands sweating, afraid  3. ONSET: "How long have you been feeling this way?" (e.g., hours, days, weeks)     How long out of medication- since 10/06/23- feeling this way for some days 4.  SEVERITY: "How would you rate the level of anxiety?" (e.g., 0 - 10; or mild, moderate, severe).     Rating 10/10 5. FUNCTIONAL IMPAIRMENT: "How have these feelings affected your ability to do daily activities?" "Have you had more difficulty than usual doing your normal daily activities?" (e.g., getting better, same, worse; self-care, school, work, interactions)     Unable to do normal activities  7. RISK OF HARM - SUICIDAL IDEATION: "Do you ever have thoughts of hurting or killing yourself?" If Yes, ask:  "Do you have these feelings now?" "Do you have a plan on how you would do this?"     no 8. TREATMENT:  "What has been done so far to treat this anxiety?" (e.g., medicines, relaxation strategies). "What has helped?"     Out of medication- symptoms worse  Protocols used: Anxiety and Panic Attack-A-AH

## 2023-10-18 ENCOUNTER — Ambulatory Visit: Payer: Self-pay | Admitting: Student

## 2023-10-18 VITALS — BP 136/64 | HR 79 | Temp 98.1°F | Ht 61.0 in | Wt 103.4 lb

## 2023-10-18 DIAGNOSIS — J3800 Paralysis of vocal cords and larynx, unspecified: Secondary | ICD-10-CM

## 2023-10-18 DIAGNOSIS — M5416 Radiculopathy, lumbar region: Secondary | ICD-10-CM | POA: Diagnosis not present

## 2023-10-18 DIAGNOSIS — F419 Anxiety disorder, unspecified: Secondary | ICD-10-CM

## 2023-10-18 MED ORDER — CLONAZEPAM 1 MG PO TABS: 1.0000 mg | ORAL_TABLET | Freq: Two times a day (BID) | ORAL | 0 refills | Status: DC

## 2023-10-18 NOTE — Patient Instructions (Addendum)
 Thank you, Ms.Emmanuel Hark for allowing us  to provide your care today. Today we discussed:  For you anxiety - Continue taking Klonopin  1 mg two times a day   I sent a referral to ENT for your vocal cords  I will reach out to our referral team for your back injections   I have ordered the following labs for you:  Lab Orders  No laboratory test(s) ordered today     Tests ordered today:  None   Referrals ordered today:   Referral Orders         Ambulatory referral to ENT       I have ordered the following medication/changed the following medications:   Stop the following medications: Medications Discontinued During This Encounter  Medication Reason   clonazePAM  (KLONOPIN ) 1 MG tablet Reorder     Start the following medications: Meds ordered this encounter  Medications   clonazePAM  (KLONOPIN ) 1 MG tablet    Sig: Take 1 tablet (1 mg total) by mouth 2 (two) times daily.    Dispense:  60 tablet    Refill:  0    Not to exceed 5 additional fills before 12/21/2023.     Follow up:  6 months for chronic conditions      Remember:   Should you have any questions or concerns please call the internal medicine clinic at [phone].     Lanney Pitts, DO [facility]
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
## 2023-10-19 ENCOUNTER — Encounter: Payer: Self-pay | Admitting: Hematology

## 2023-10-19 ENCOUNTER — Other Ambulatory Visit: Payer: Self-pay | Admitting: Student

## 2023-10-19 NOTE — Telephone Encounter (Signed)
 [facility] - she stated it's 9 days too early. Stated the doctor can send another rx (since it's a control med) ; and in the comment section stated pt has been taking 2 tabs since whatever date? And then they will refill it a few days early. Pt was informed.

## 2023-10-19 NOTE — Assessment & Plan Note (Signed)
 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 Per last OV 04/2023, she was referred to IR for steroid injections, never received a call about it. Per chart review, she was receiving selective right L5 nerve root injections up until 2022. Her referral is approved and she is supposed to receive previous injections. - Ordered DG Epidural/Nerve root injections - Right L5 nerve root block and transforaminal epidural.

## 2023-10-19 NOTE — Assessment & Plan Note (Signed)
 She is chronically on clonazepam  1 mg BID, reports the her last medication refill was incorrect, she was prescribed daily dose for 60 days. She was taking her medication as usually with BID dosing until she ran out on April 17, and reached out to pharmacy for refill who notified her that it was too early. PDMP reviewed, consistent. Reports that since she has been out of her medication, she feels terrible with anxiety and panic attacks. GAD -7 score is 19 in office today. - Refill klonopin  1 mg BID sent to pharmacy    reports that her last medication fill was every day. Last dose April 17th, reports panic attacks., PDMP reviewed. GAD 19

## 2023-10-19 NOTE — Telephone Encounter (Unsigned)
 Copied from CRM [id number]. Topic: Clinical - Medication Refill >> Oct 19, 2023  8:37 AM Jamas Maywood wrote: Most Recent Primary Care Visit:  Provider: Fay Hoop  Department: [facility]  Visit Type: OPEN ESTABLISHED  Date: 05/09/2023  Medication: clonazePAM  (KLONOPIN ) 1 MG tablet  Has the patient contacted their pharmacy? Yes (Agent: If no, request that the patient contact the pharmacy for the refill. If patient does not wish to contact the pharmacy document the reason why and proceed with request.) (Agent: If yes, when and what did the pharmacy advise?)  Is this the correct pharmacy for this prescription? Yes If no, delete pharmacy and type the correct one.  This is the patient's preferred pharmacy:  [facility] [address], [place] - [address] [place] [address] Phone: [phone] Fax: [phone]  Has the prescription been filled recently? Yes  Is the patient out of the medication? Yes  Has the patient been seen for an appointment in the last year OR does the patient have an upcoming appointment? Yes  Can we respond through MyChart? Yes  Agent: Please be advised that Rx refills may take up to 3 business days. We ask that you follow-up with your pharmacy.

## 2023-10-19 NOTE — Assessment & Plan Note (Signed)
 Horseness that has been going for year and half, reports SOB with conversation and unable to complete her sentences. States that her initial symptoms were hoarseness prior to her being diagnosed with lung CA. No concerns with swallowing solids and liquids. No swelling of neck noted. Per exam, she speaks couple of words prior to stopping and taking a breath. - ENT referral sent

## 2023-10-20 ENCOUNTER — Telehealth: Payer: Self-pay | Admitting: *Deleted

## 2023-10-20 ENCOUNTER — Telehealth: Payer: Self-pay

## 2023-10-20 NOTE — Telephone Encounter (Signed)
 Pt was transferred to me from [id number](Harry). Pt states the pharmacy is waiting for the doctor to approve clonazePAM  (KLONOPIN ) 1 MG tablet. Pt states the medication has been received by the pharmacy, but they are not releasing the medication to her because it's controlled medication and they're waiting for the doctor approval. Requesting this to be done by today. States she is completely out of medication.

## 2023-10-20 NOTE — Telephone Encounter (Signed)
 Copied from CRM [id number]. Topic: Clinical - Medication Refill >> Oct 19, 2023  8:37 AM Jamas Maywood wrote: Most Recent Primary Care Visit:  Provider: Fay Hoop  [facility]  Visit Type: OPEN ESTABLISHED  Date: 05/09/2023  Medication: clonazePAM  (KLONOPIN ) 1 MG tablet  Has the patient contacted their pharmacy? Yes (Agent: If no, request that the patient contact the pharmacy for the refill. If patient does not wish to contact the pharmacy document the reason why and proceed with request.) (Agent: If yes, when and what did the pharmacy advise?)  Is this the correct pharmacy for this prescription? Yes If no, delete pharmacy and type the correct one.  This is the patient's preferred pharmacy:  [facility] [address], [place] - [address] [place] [address] Phone: [phone] Fax: [phone]  Has the prescription been filled recently? Yes  Is the patient out of the medication? Yes  Has the patient been seen for an appointment in the last year OR does the patient have an upcoming appointment? Yes  Can we respond through MyChart? Yes  Agent: Please be advised that Rx refills may take up to 3 business days. We ask that you follow-up with your pharmacy. >> Oct 20, 2023 10:57 AM Retta Caster wrote: clonazePAM  (KLONOPIN ) 1 MG tablet [[id number]]   Patient calling on update for refill medication. As instructed by previous agent it is in route for refill. Patient stated pharmacy instructed to contact office due to someithing is missuge but would not explain what that is the delay of refill for this medication. Called office and transferred

## 2023-10-21 NOTE — Telephone Encounter (Signed)
 RTC to patient received medication on yesterday.

## 2023-10-24 MED ORDER — CLONAZEPAM 1 MG PO TABS: 1.0000 mg | ORAL_TABLET | Freq: Two times a day (BID) | ORAL | 0 refills | Status: DC

## 2023-10-24 NOTE — Progress Notes (Signed)
 Internal Medicine Clinic Attending  Case discussed with the resident at the time of the visit.  We reviewed the resident's history and exam and pertinent patient test results.  I agree with the assessment, diagnosis, and plan of care documented in the resident's note.  Follows with oncology for history of lung cancer. Recent CT without residual mass or LAD. Will refer to ENT for laryngoscopy and assessment for vocal cord dysfunction.

## 2023-10-26 ENCOUNTER — Other Ambulatory Visit: Payer: Self-pay | Admitting: Otolaryngology

## 2023-10-26 DIAGNOSIS — J3801 Paralysis of vocal cords and larynx, unilateral: Secondary | ICD-10-CM

## 2023-10-27 ENCOUNTER — Other Ambulatory Visit

## 2023-10-31 ENCOUNTER — Other Ambulatory Visit: Payer: Self-pay

## 2023-10-31 MED ORDER — CLONAZEPAM 1 MG PO TABS: 1.0000 mg | ORAL_TABLET | Freq: Every day | ORAL | 0 refills | Status: DC

## 2023-11-01 ENCOUNTER — Ambulatory Visit: Admitting: Hematology

## 2023-11-01 ENCOUNTER — Other Ambulatory Visit

## 2023-11-02 ENCOUNTER — Other Ambulatory Visit: Payer: Self-pay | Admitting: Hematology

## 2023-11-02 ENCOUNTER — Other Ambulatory Visit: Payer: Self-pay

## 2023-11-02 ENCOUNTER — Other Ambulatory Visit: Payer: Self-pay | Admitting: Pharmacy Technician

## 2023-11-02 DIAGNOSIS — C3490 Malignant neoplasm of unspecified part of unspecified bronchus or lung: Secondary | ICD-10-CM

## 2023-11-02 NOTE — Progress Notes (Signed)
 Specialty Pharmacy Refill Coordination Note  Karen [name] is a 57 y.o. female contacted today regarding refills of specialty medication(s) Dabrafenib Mesylate  (TAFINLAR ); Trametinib  Dimethyl Sulfoxide  (MEKINIST )  Spoke with Husband  Patient requested Pickup at [facility] [place] date: 11/10/23   Medication will be filled on 11/09/23.  This fill date is pending response to refill request from provider. Patient is aware and if they have not received fill by intended date they must follow up with pharmacy.

## 2023-11-03 ENCOUNTER — Other Ambulatory Visit: Payer: Self-pay

## 2023-11-03 ENCOUNTER — Other Ambulatory Visit (HOSPITAL_COMMUNITY): Payer: Self-pay

## 2023-11-03 MED ORDER — DABRAFENIB MESYLATE 75 MG PO CAPS
150.0000 mg | ORAL_CAPSULE | Freq: Two times a day (BID) | ORAL | 2 refills | Status: DC
Start: 2023-11-03 — End: 2024-02-28
  Filled 2023-11-03: qty 120, 30d supply, fill #0
  Filled 2023-12-09: qty 120, 30d supply, fill #1
  Filled 2024-01-17 – 2024-02-01 (×3): qty 120, 30d supply, fill #2

## 2023-11-03 MED ORDER — TRAMETINIB DIMETHYL SULFOXIDE 2 MG PO TABS
2.0000 mg | ORAL_TABLET | Freq: Every day | ORAL | 2 refills | Status: DC
  Filled 2023-11-03: qty 30, 30d supply, fill #0
  Filled 2023-12-09: qty 30, 30d supply, fill #1
  Filled 2024-01-17 – 2024-02-01 (×5): qty 30, 30d supply, fill #2

## 2023-11-08 ENCOUNTER — Other Ambulatory Visit: Payer: Self-pay

## 2023-11-08 ENCOUNTER — Ambulatory Visit
Admission: RE | Admit: 2023-11-08 | Discharge: 2023-11-08 | Disposition: A | Discharge status: Home / Self Care | Source: Ambulatory Visit | Attending: Otolaryngology | Admitting: Otolaryngology

## 2023-11-08 DIAGNOSIS — J3801 Paralysis of vocal cords and larynx, unilateral: Secondary | ICD-10-CM

## 2023-11-09 ENCOUNTER — Other Ambulatory Visit: Payer: Self-pay

## 2023-11-17 ENCOUNTER — Telehealth: Payer: Self-pay

## 2023-11-17 ENCOUNTER — Ambulatory Visit: Payer: Self-pay | Admitting: Student

## 2023-11-17 ENCOUNTER — Encounter: Payer: Self-pay | Admitting: Internal Medicine

## 2023-11-17 ENCOUNTER — Encounter: Payer: Self-pay | Admitting: Student

## 2023-11-17 VITALS — BP 101/69 | HR 82 | Temp 98.0°F | Ht 61.0 in | Wt 102.4 lb

## 2023-11-17 DIAGNOSIS — J3800 Paralysis of vocal cords and larynx, unspecified: Secondary | ICD-10-CM

## 2023-11-17 DIAGNOSIS — G893 Neoplasm related pain (acute) (chronic): Secondary | ICD-10-CM

## 2023-11-17 DIAGNOSIS — F339 Major depressive disorder, recurrent, unspecified: Secondary | ICD-10-CM

## 2023-11-17 DIAGNOSIS — Z515 Encounter for palliative care: Secondary | ICD-10-CM

## 2023-11-17 DIAGNOSIS — C3490 Malignant neoplasm of unspecified part of unspecified bronchus or lung: Secondary | ICD-10-CM

## 2023-11-17 DIAGNOSIS — F419 Anxiety disorder, unspecified: Secondary | ICD-10-CM

## 2023-11-17 DIAGNOSIS — R112 Nausea with vomiting, unspecified: Secondary | ICD-10-CM

## 2023-11-17 DIAGNOSIS — R110 Nausea: Secondary | ICD-10-CM

## 2023-11-17 MED ORDER — CLONAZEPAM 1 MG PO TABS: 1.0000 mg | ORAL_TABLET | Freq: Two times a day (BID) | ORAL | 0 refills | Status: DC

## 2023-11-17 MED ORDER — PROCHLORPERAZINE MALEATE 10 MG PO TABS: 10.0000 mg | ORAL_TABLET | Freq: Four times a day (QID) | ORAL | 1 refills | Status: DC | PRN

## 2023-11-17 NOTE — Discharge Instructions (Signed)

## 2023-11-17 NOTE — Patient Instructions (Addendum)
 Karen [name],Thank you for allowing me to take part in your care today.  Here are your instructions.  1. I have sent in a new medication for your nausea. It is called compazine  you can alternate this with your Zofran   2. Please reach out to your palliative care doctor and your cancer doctor to see if you can be seen earlier.   3. Please reach out to your psychiatrist to see if you can be seen by them to see if you can get a new treatment for your depression.   4. The number to the ENT docotr to get in touch with your speech therapist is below.   [facility] [place] Formerly known [facility] [address] [place] [address] [phone]  PLEASE BRING YOUR MEDICATIONS TO EVERY APPOINTMENT  Thank you, Dr. Lydia Sams  If you have any other questions please contact the internal medicine clinic at [phone] If it is after hours, please call the [facility] at [phone] and then ask the person who picks up for the resident on call.   lga Siegfried Dress por permitirme participar en su atencin mdica hoy. Aqu tiene sus instrucciones.  1. Le he enviado un nuevo medicamento para las nuseas. Se llama Compazine  y puede alternarlo con su Zofran .  2. Por favor, comunquese con su mdico de cuidados paliativos y su onclogo para ver si puede recibir una cita antes.  3. Por favor, comunquese con su psiquiatra para ver si puede recibir [place] cita y obtener un nuevo tratamiento para su depresin.  4. El nmero del otorrinolaringlogo para contactar a su logopeda se encuentra a continuacin.  [facility] [place] Anteriormente conocido [facility] [address], [place] [address] [phone]  POR FAVOR, TRAIGA SUS MEDICAMENTOS A TODAS SUS CITAS.  Gracias, Dr. Lydia Sams.  Si tiene  alguna otra pregunta, comunquese con la clnica de medicina interna al [phone]. Si es fuera del horario de [place], llame al hospital [facility] al [phone] y luego pregunte a la persona que recoge al residente de guardia.

## 2023-11-17 NOTE — Telephone Encounter (Signed)
 Karen [name] scheduled her appointment.

## 2023-11-17 NOTE — Assessment & Plan Note (Signed)
 Patient has past medical history of nausea and vomiting.  This is secondary to her chemotherapy medications.  Her current medications include Trametinib  and Fabrafenib.  She reached out to her oncologist regarding this nausea and vomiting who wrote her Zofran .  The Zofran  has been helping.  Patient reports she has not been adherent to it.  She is able to eat and drink and keep things down at times.  I do not think patient needs admission for IV fluids at this time.  Recommended her stay hydrated.  Given she has ongoing nausea, I recommended to her a new medicine.  Told her to take Zofran  as well.  Plan: - Start Compazine  - Continue Zofran  - Recommended call her oncologist and palliative care doctor for better symptom management

## 2023-11-17 NOTE — Progress Notes (Signed)
 Internal Medicine Clinic Attending  Case discussed with the resident at the time of the visit.  We reviewed the resident's history and exam and pertinent patient test results.  I agree with the assessment, diagnosis, and plan of care documented in the resident's note.

## 2023-11-17 NOTE — Assessment & Plan Note (Signed)
 Patient has a past medical history of anxiety.  Well-controlled at this time.  Patient requests refill on her Klonopin  as she is running out as she gets it only for monthly supply.  Checked PDMP, and is appropriate.  Plan: - Refill Klonopin  1 mg twice daily

## 2023-11-17 NOTE — Assessment & Plan Note (Signed)
 Patient was recently seen by ENT who recommended speech therapy.  Patient reports that she got a phone call from speech therapy, but she was taking a shower at the time and could not get the phone.  They did not leave a voicemail.  She needs the number to call them back.  Plan: - Number provided

## 2023-11-17 NOTE — Assessment & Plan Note (Addendum)
 Patient has a history of chronic pain secondary to cancer.  She is followed by palliative care for pain management.  She currently takes hydrocodone  5 mg every 6 hours as needed.  She states it is not helping.  She does endorse not being adherent to her medicine.  I recommended her to be adherent.  She does have an appointment with her palliative care doctor on 11/30/2023.  Recommended her push this appointment up.   Plan: - Continue Norco 5 mg every 6 hours as needed - Follow-up with palliative care, try to call palliative care and push up appointment
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
## 2023-11-17 NOTE — Assessment & Plan Note (Addendum)
 Patient has a past medical history of major depression.  She reports she is becoming more depressed now.  Her current medication includes Zoloft  100 mg twice daily.  She endorses adherence to the medication.  She does see psychiatry.  She states she recently had to say goodbye to her daughter as her daughter went back to the country that she is from.  She feels sad that she has left.  She denies any homicidal or suicidal ideation.  Patient was previously in therapy, and states it has not been helping her for this.  She has tried many medications in the past.  She states she is followed by psychiatry.  I told her to call her psychiatrist as potentially she needs a new therapy.  Thankfully patient is not suicidal or has no suicidal ideation.  I think patient could potentially benefit from intranasal ketamine or electroconvulsive therapy.  Plan: - Patient encouraged to call her psychiatrist - Continue Zoloft  100 mg twice daily

## 2023-11-18 ENCOUNTER — Inpatient Hospital Stay
Admission: RE | Admit: 2023-11-18 | Discharge: 2023-11-18 | Disposition: A | Discharge status: Home / Self Care | Source: Ambulatory Visit | Attending: Internal Medicine

## 2023-11-22 NOTE — Discharge Instructions (Signed)

## 2023-11-24 ENCOUNTER — Ambulatory Visit
Admission: RE | Admit: 2023-11-24 | Discharge: 2023-11-24 | Disposition: A | Discharge status: Home / Self Care | Source: Ambulatory Visit | Attending: Internal Medicine

## 2023-11-24 DIAGNOSIS — M5416 Radiculopathy, lumbar region: Secondary | ICD-10-CM

## 2023-11-24 MED ORDER — IOPAMIDOL (ISOVUE-M 200) INJECTION 41%
1.0000 mL | Freq: Once | INTRAMUSCULAR | Status: AC
  Administered 2023-11-24: 1 mL via EPIDURAL

## 2023-11-24 MED ORDER — METHYLPREDNISOLONE ACETATE 40 MG/ML INJ SUSP (RADIOLOG
80.0000 mg | Freq: Once | INTRAMUSCULAR | Status: AC
  Administered 2023-11-24: 80 mg via EPIDURAL

## 2023-11-28 ENCOUNTER — Ambulatory Visit: Admitting: Speech Pathology

## 2023-11-30 ENCOUNTER — Encounter: Payer: Self-pay | Admitting: Nurse Practitioner

## 2023-11-30 ENCOUNTER — Inpatient Hospital Stay: Attending: Hematology | Admitting: Nurse Practitioner

## 2023-11-30 VITALS — BP 110/55 | HR 78 | Temp 98.0°F | Resp 18 | Ht 61.0 in | Wt 105.4 lb

## 2023-11-30 DIAGNOSIS — C7949 Secondary malignant neoplasm of other parts of nervous system: Secondary | ICD-10-CM | POA: Diagnosis not present

## 2023-11-30 DIAGNOSIS — Z515 Encounter for palliative care: Secondary | ICD-10-CM

## 2023-11-30 DIAGNOSIS — G893 Neoplasm related pain (acute) (chronic): Secondary | ICD-10-CM

## 2023-11-30 DIAGNOSIS — R530 Neoplastic (malignant) related fatigue: Secondary | ICD-10-CM | POA: Diagnosis not present
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
## 2023-12-01 ENCOUNTER — Encounter: Payer: Self-pay | Admitting: Hematology

## 2023-12-02 ENCOUNTER — Other Ambulatory Visit: Payer: Self-pay

## 2023-12-05 ENCOUNTER — Ambulatory Visit: Attending: Ophthalmology | Admitting: Speech Pathology

## 2023-12-05 ENCOUNTER — Encounter: Payer: Self-pay | Admitting: Speech Pathology

## 2023-12-05 DIAGNOSIS — R498 Other voice and resonance disorders: Secondary | ICD-10-CM | POA: Diagnosis present

## 2023-12-05 DIAGNOSIS — R1313 Dysphagia, pharyngeal phase: Secondary | ICD-10-CM | POA: Diagnosis present
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
## 2023-12-06 ENCOUNTER — Other Ambulatory Visit: Payer: Self-pay

## 2023-12-06 ENCOUNTER — Encounter: Payer: Self-pay | Admitting: Speech Pathology

## 2023-12-07 ENCOUNTER — Ambulatory Visit: Admitting: Speech Pathology

## 2023-12-07 ENCOUNTER — Encounter: Payer: Self-pay | Admitting: Speech Pathology

## 2023-12-07 DIAGNOSIS — R1313 Dysphagia, pharyngeal phase: Secondary | ICD-10-CM

## 2023-12-07 DIAGNOSIS — R498 Other voice and resonance disorders: Secondary | ICD-10-CM | POA: Diagnosis not present

## 2023-12-08 ENCOUNTER — Other Ambulatory Visit: Payer: Self-pay
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
## 2023-12-09 ENCOUNTER — Other Ambulatory Visit: Payer: Self-pay

## 2023-12-09 NOTE — Progress Notes (Signed)
 Specialty Pharmacy Refill Coordination Note  Karen [name] is a 58 y.o. female contacted today regarding refills of specialty medication(s) Dabrafenib Mesylate  (TAFINLAR ); Trametinib  Dimethyl Sulfoxide  (MEKINIST )   Patient requested Pickup at [facility] at [place] date: 12/13/23   Medication will be filled on 06.23.25.

## 2023-12-12 ENCOUNTER — Other Ambulatory Visit: Payer: Self-pay

## 2023-12-13 ENCOUNTER — Ambulatory Visit: Admitting: Speech Pathology

## 2023-12-20 ENCOUNTER — Telehealth: Payer: Self-pay | Admitting: Nurse Practitioner

## 2023-12-20 ENCOUNTER — Encounter: Payer: Self-pay | Admitting: Speech Pathology

## 2023-12-20 ENCOUNTER — Ambulatory Visit: Attending: Ophthalmology | Admitting: Speech Pathology

## 2023-12-20 DIAGNOSIS — R498 Other voice and resonance disorders: Secondary | ICD-10-CM | POA: Diagnosis present

## 2023-12-20 DIAGNOSIS — R1313 Dysphagia, pharyngeal phase: Secondary | ICD-10-CM | POA: Diagnosis present

## 2023-12-20 NOTE — Telephone Encounter (Signed)
 Today I spoke with Karen [name] with the interpretor on the line to re-schedule her MD appt to align with Palliative care. Karen [name] has been scheduled for all appointments to be completed on 8/4.
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
## 2023-12-26 ENCOUNTER — Other Ambulatory Visit: Payer: Self-pay | Admitting: Student

## 2023-12-26 ENCOUNTER — Other Ambulatory Visit: Payer: Self-pay | Admitting: Neurology

## 2023-12-26 NOTE — Telephone Encounter (Unsigned)
 Copied from CRM [id number]. Topic: Clinical - Medication Refill >> Dec 26, 2023  2:47 PM Fredrica W wrote: Medication: clonazePAM  (KLONOPIN ) 1 MG tablet   Has the patient contacted their pharmacy? No (Agent: If no, request that the patient contact the pharmacy for the refill. If patient does not wish to contact the pharmacy document the reason why and proceed with request.) (Agent: If yes, when and what did the pharmacy advise?)  This is the patient's preferred pharmacy:  [id number] [address], [place] - [address] [place] [address] Phone: [phone] Fax: [phone]   Is this the correct pharmacy for this prescription? Yes If no, delete pharmacy and type the correct one.   Has the prescription been filled recently? Yes  Is the patient out of the medication? No  Has the patient been seen for an appointment in the last year OR does the patient have an upcoming appointment? Yes  Can we respond through MyChart? No  Agent: Please be advised that Rx refills may take up to 3 business days. We ask that you follow-up with your pharmacy.

## 2023-12-28 ENCOUNTER — Other Ambulatory Visit: Payer: Self-pay | Admitting: Otolaryngology

## 2023-12-28 DIAGNOSIS — R1310 Dysphagia, unspecified: Secondary | ICD-10-CM

## 2023-12-28 MED ORDER — CLONAZEPAM 1 MG PO TABS: 1.0000 mg | ORAL_TABLET | Freq: Every day | ORAL | 0 refills | Status: DC

## 2023-12-29 ENCOUNTER — Other Ambulatory Visit: Payer: Self-pay

## 2023-12-29 NOTE — Progress Notes (Signed)
 Specialty Pharmacy Ongoing Clinical Assessment Note  Karen [name] is a 58 y.o. female who is being followed by the specialty pharmacy service for RxSp Oncology   Patient's specialty medication(s) reviewed today: Dabrafenib Mesylate  (TAFINLAR ); Trametinib  Dimethyl Sulfoxide  (MEKINIST )   Missed doses in the last 4 weeks: 0   Patient/Caregiver did not have any additional questions or concerns.   Therapeutic benefit summary: Patient is achieving benefit   Adverse events/side effects summary: Experienced adverse events/side effects (vomiting for last several days, has appt tomorrow to discuss.)   Patient's therapy is appropriate to: Continue    Goals Addressed             This Visit's Progress    Slow Disease Progression   On track    Patient is on track. Patient will maintain adherence. Per notes from 4/23, labs are stable and plan to repeat scans in 12 weeks. Per husband today, patient has been vomiting and not doing well over the last several days, has concern that cancer may be changing. Has appt tomorrow to discuss new side effects/symptoms.        Follow up: 3 months  [facility]

## 2023-12-30 ENCOUNTER — Ambulatory Visit (HOSPITAL_COMMUNITY)

## 2024-01-02 ENCOUNTER — Other Ambulatory Visit (HOSPITAL_COMMUNITY): Payer: Self-pay | Admitting: Otolaryngology

## 2024-01-02 DIAGNOSIS — R1310 Dysphagia, unspecified: Secondary | ICD-10-CM

## 2024-01-03 ENCOUNTER — Other Ambulatory Visit (HOSPITAL_COMMUNITY): Payer: Self-pay

## 2024-01-04 ENCOUNTER — Ambulatory Visit (HOSPITAL_COMMUNITY)
Admission: RE | Admit: 2024-01-04 | Discharge: 2024-01-04 | Disposition: A | Discharge status: Home / Self Care | Source: Ambulatory Visit | Attending: Hematology | Admitting: Hematology

## 2024-01-04 DIAGNOSIS — C3490 Malignant neoplasm of unspecified part of unspecified bronchus or lung: Secondary | ICD-10-CM | POA: Insufficient documentation

## 2024-01-04 MED ORDER — IOHEXOL 300 MG/ML  SOLN
80.0000 mL | Freq: Once | INTRAMUSCULAR | Status: AC | PRN
  Administered 2024-01-04: 80 mL via INTRAVENOUS

## 2024-01-04 MED ORDER — IOHEXOL 9 MG/ML PO SOLN
1000.0000 mL | Freq: Once | ORAL | Status: AC
  Administered 2024-01-04: 1000 mL via ORAL

## 2024-01-05 ENCOUNTER — Ambulatory Visit (HOSPITAL_COMMUNITY)
Admission: RE | Admit: 2024-01-05 | Discharge: 2024-01-05 | Disposition: A | Discharge status: Home / Self Care | Source: Ambulatory Visit | Attending: Hematology | Admitting: Hematology

## 2024-01-05 DIAGNOSIS — C3490 Malignant neoplasm of unspecified part of unspecified bronchus or lung: Secondary | ICD-10-CM | POA: Diagnosis present

## 2024-01-05 MED ORDER — GADOBUTROL 1 MMOL/ML IV SOLN
7.0000 mL | Freq: Once | INTRAVENOUS | Status: AC | PRN
  Administered 2024-01-05: 5 mL via INTRAVENOUS

## 2024-01-05 MED ORDER — HEPARIN SOD (PORK) LOCK FLUSH 100 UNIT/ML IV SOLN
500.0000 [IU] | Freq: Once | INTRAVENOUS | Status: AC
  Administered 2024-01-05: 500 [IU] via INTRAVENOUS

## 2024-01-09 ENCOUNTER — Other Ambulatory Visit (HOSPITAL_COMMUNITY): Payer: Self-pay

## 2024-01-10 ENCOUNTER — Other Ambulatory Visit (HOSPITAL_COMMUNITY): Payer: Self-pay

## 2024-01-17 ENCOUNTER — Other Ambulatory Visit: Payer: Self-pay

## 2024-01-18 ENCOUNTER — Ambulatory Visit (HOSPITAL_COMMUNITY)
Admission: RE | Admit: 2024-01-18 | Discharge: 2024-01-18 | Disposition: A | Discharge status: Home / Self Care | Source: Ambulatory Visit | Attending: *Deleted | Admitting: *Deleted

## 2024-01-18 ENCOUNTER — Ambulatory Visit (HOSPITAL_COMMUNITY)
Admission: RE | Admit: 2024-01-18 | Discharge: 2024-01-18 | Disposition: A | Discharge status: Home / Self Care | Source: Ambulatory Visit | Attending: Otolaryngology | Admitting: Otolaryngology

## 2024-01-18 ENCOUNTER — Other Ambulatory Visit: Payer: Self-pay

## 2024-01-18 DIAGNOSIS — R079 Chest pain, unspecified: Secondary | ICD-10-CM | POA: Insufficient documentation

## 2024-01-18 DIAGNOSIS — G8929 Other chronic pain: Secondary | ICD-10-CM | POA: Diagnosis not present

## 2024-01-18 DIAGNOSIS — I10 Essential (primary) hypertension: Secondary | ICD-10-CM | POA: Diagnosis not present

## 2024-01-18 DIAGNOSIS — R09A2 Foreign body sensation, throat: Secondary | ICD-10-CM | POA: Diagnosis not present

## 2024-01-18 DIAGNOSIS — R1310 Dysphagia, unspecified: Secondary | ICD-10-CM | POA: Insufficient documentation

## 2024-01-18 DIAGNOSIS — K589 Irritable bowel syndrome without diarrhea: Secondary | ICD-10-CM | POA: Insufficient documentation

## 2024-01-18 DIAGNOSIS — R109 Unspecified abdominal pain: Secondary | ICD-10-CM | POA: Diagnosis not present

## 2024-01-18 DIAGNOSIS — K219 Gastro-esophageal reflux disease without esophagitis: Secondary | ICD-10-CM | POA: Diagnosis not present

## 2024-01-18 NOTE — Therapy (Signed)
 Modified Barium Swallow Study  Patient Details  Name: Karen [name] MRN: [id number] Date of Birth: [birth year]  Today's Date: 01/18/2024  Modified Barium Swallow completed.  Full report located under Chart Review in the Imaging Section.  History of Present Illness Karen [name] is a 58 y.o. female with PMH: GERD, HTN, IBS, chronic abdominal pain, chronic chest pain, dysphagia. She has had MBS's in January of 2024 and then again in July of 2024 with reportedly no significant change in pharyngeal phase of swallowing and suspected esophageal component. She presented to this OP MBS, recommended by OP SLP and referred by her PCP, secondary to patient's c/o ongoing but subjectively worsening odynophagia and globus sensation.   Clinical Impression As compared to MBS last year (01/18/2023), patient's oral and pharyngeal phases of swallowing do not appear to have significantly changed. No penetration, aspiration or pharyngeal residuals observed with any of the tested liquid or solid barium consistencies. Patient does present with suspected esophageal phase dysphagia, however. Esophagus appears to be air/gas filled and barium stasis and retrograde flow was observed below level of PES. This did correlate with patient's c/o globus sensation and/or pain. In addition, during esophageal sweep, slowed transit of barium (mainly with thicker liquid barium, and puree and mechanical soft solids) was observed in thoracic portion of esophagus. Once barium had transited past this point, no stasis or retrograde flow observed distally. SLP recommends that patient discuss results with OP SLP. She may benefit from GI consult.   Swallow Evaluation Recommendations Recommendations: PO diet PO Diet Recommendation: Regular;Thin liquids (Level 0) Swallowing strategies  : Slow rate;Small bites/sips Postural changes: Stay upright 30-60 min after meals;Position pt fully upright for meals Recommended consults: Consider GI  consultation      Norleen IVAR Blase, MA, CCC-SLP Speech Therapy

## 2024-01-19 ENCOUNTER — Other Ambulatory Visit: Payer: Self-pay

## 2024-01-20 ENCOUNTER — Other Ambulatory Visit: Payer: Self-pay

## 2024-01-20 ENCOUNTER — Ambulatory Visit: Admitting: Hematology

## 2024-01-20 ENCOUNTER — Other Ambulatory Visit

## 2024-01-20 DIAGNOSIS — C3490 Malignant neoplasm of unspecified part of unspecified bronchus or lung: Secondary | ICD-10-CM
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
## 2024-01-23 ENCOUNTER — Other Ambulatory Visit (HOSPITAL_COMMUNITY): Payer: Self-pay

## 2024-01-23 ENCOUNTER — Inpatient Hospital Stay: Attending: Hematology

## 2024-01-23 ENCOUNTER — Inpatient Hospital Stay (HOSPITAL_BASED_OUTPATIENT_CLINIC_OR_DEPARTMENT_OTHER): Admitting: Hematology

## 2024-01-23 ENCOUNTER — Inpatient Hospital Stay: Admitting: Nurse Practitioner

## 2024-01-23 ENCOUNTER — Other Ambulatory Visit: Payer: Self-pay

## 2024-01-23 ENCOUNTER — Encounter: Payer: Self-pay | Admitting: Nurse Practitioner

## 2024-01-23 VITALS — BP 107/73 | HR 76 | Temp 97.5°F | Resp 18 | Wt 103.9 lb

## 2024-01-23 DIAGNOSIS — Z515 Encounter for palliative care: Secondary | ICD-10-CM | POA: Diagnosis not present

## 2024-01-23 DIAGNOSIS — Z7902 Long term (current) use of antithrombotics/antiplatelets: Secondary | ICD-10-CM | POA: Diagnosis not present

## 2024-01-23 DIAGNOSIS — I10 Essential (primary) hypertension: Secondary | ICD-10-CM | POA: Insufficient documentation

## 2024-01-23 DIAGNOSIS — E876 Hypokalemia: Secondary | ICD-10-CM

## 2024-01-23 DIAGNOSIS — G893 Neoplasm related pain (acute) (chronic): Secondary | ICD-10-CM

## 2024-01-23 DIAGNOSIS — Z79899 Other long term (current) drug therapy: Secondary | ICD-10-CM | POA: Diagnosis not present

## 2024-01-23 DIAGNOSIS — C7951 Secondary malignant neoplasm of bone: Secondary | ICD-10-CM | POA: Insufficient documentation

## 2024-01-23 DIAGNOSIS — R519 Headache, unspecified: Secondary | ICD-10-CM | POA: Diagnosis not present

## 2024-01-23 DIAGNOSIS — C3490 Malignant neoplasm of unspecified part of unspecified bronchus or lung: Secondary | ICD-10-CM

## 2024-01-23 DIAGNOSIS — Z7952 Long term (current) use of systemic steroids: Secondary | ICD-10-CM | POA: Insufficient documentation

## 2024-01-23 DIAGNOSIS — R110 Nausea: Secondary | ICD-10-CM | POA: Diagnosis not present

## 2024-01-23 DIAGNOSIS — C7931 Secondary malignant neoplasm of brain: Secondary | ICD-10-CM | POA: Insufficient documentation

## 2024-01-23 LAB — CBC WITH DIFFERENTIAL (CANCER CENTER ONLY)
Abs Immature Granulocytes: 0.01 K/uL (ref 0.00–0.07)
Basophils Absolute: 0 K/uL (ref 0.0–0.1)
Basophils Relative: 1 %
Eosinophils Absolute: 0.1 K/uL (ref 0.0–0.5)
Eosinophils Relative: 3 %
HCT: 32.2 % — ABNORMAL LOW (ref 36.0–46.0)
Hemoglobin: 10.5 g/dL — ABNORMAL LOW (ref 12.0–15.0)
Immature Granulocytes: 0 %
Lymphocytes Relative: 27 %
Lymphs Abs: 1.1 K/uL (ref 0.7–4.0)
MCH: 28.6 pg (ref 26.0–34.0)
MCHC: 32.6 g/dL (ref 30.0–36.0)
MCV: 87.7 fL (ref 80.0–100.0)
Monocytes Absolute: 0.4 K/uL (ref 0.1–1.0)
Monocytes Relative: 10 %
Neutro Abs: 2.4 K/uL (ref 1.7–7.7)
Neutrophils Relative %: 59 %
Platelet Count: 192 K/uL (ref 150–400)
RBC: 3.67 MIL/uL — ABNORMAL LOW (ref 3.87–5.11)
RDW: 16.2 % — ABNORMAL HIGH (ref 11.5–15.5)
WBC Count: 4.1 K/uL (ref 4.0–10.5)
nRBC: 0 % (ref 0.0–0.2)

## 2024-01-23 LAB — CMP (CANCER CENTER ONLY)
ALT: 16 U/L (ref 0–44)
AST: 30 U/L (ref 15–41)
Albumin: 3.9 g/dL (ref 3.5–5.0)
Alkaline Phosphatase: 161 U/L — ABNORMAL HIGH (ref 38–126)
Anion gap: 6 (ref 5–15)
BUN: 18 mg/dL (ref 6–20)
CO2: 26 mmol/L (ref 22–32)
Calcium: 9.1 mg/dL (ref 8.9–10.3)
Chloride: 105 mmol/L (ref 98–111)
Creatinine: 0.86 mg/dL (ref 0.44–1.00)
GFR, Estimated: >60 mL/min (ref >60)
Glucose, Bld: 110 mg/dL — ABNORMAL HIGH (ref 70–99)
Potassium: 3.9 mmol/L (ref 3.5–5.1)
Sodium: 137 mmol/L (ref 135–145)
Total Bilirubin: 0.3 mg/dL (ref 0.0–1.2)
Total Protein: 7.3 g/dL (ref 6.5–8.1)

## 2024-01-23 LAB — MAGNESIUM: Magnesium: 1.5 mg/dL — ABNORMAL LOW (ref 1.7–2.4)

## 2024-01-23 MED ORDER — HYDROCODONE-ACETAMINOPHEN 5-325 MG PO TABS
1.0000 | ORAL_TABLET | Freq: Four times a day (QID) | ORAL | 0 refills | Status: DC | PRN
  Filled 2024-01-23: qty 60, 8d supply, fill #0

## 2024-01-23 MED ORDER — POTASSIUM CHLORIDE CRYS ER 20 MEQ PO TBCR
20.0000 meq | EXTENDED_RELEASE_TABLET | Freq: Every day | ORAL | 2 refills | Status: DC
  Filled 2024-01-23: qty 30, 30d supply, fill #0
  Filled 2024-03-23: qty 30, 30d supply, fill #1
  Filled 2024-04-06 – 2024-05-23 (×2): qty 30, 30d supply, fill #2

## 2024-01-23 MED ORDER — ONDANSETRON HCL 4 MG PO TABS
4.0000 mg | ORAL_TABLET | Freq: Four times a day (QID) | ORAL | 6 refills | Status: DC
Start: 2024-01-23 — End: 2024-04-11
  Filled 2024-01-23: qty 30, 8d supply, fill #0
  Filled 2024-02-08: qty 30, 8d supply, fill #1
  Filled 2024-03-23: qty 30, 8d supply, fill #2
  Filled 2024-04-06: qty 30, 8d supply, fill #3
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
## 2024-01-27 ENCOUNTER — Telehealth: Payer: Self-pay | Admitting: Student

## 2024-01-27 ENCOUNTER — Other Ambulatory Visit: Payer: Self-pay

## 2024-01-27 ENCOUNTER — Other Ambulatory Visit (HOSPITAL_COMMUNITY): Payer: Self-pay

## 2024-01-27 NOTE — Telephone Encounter (Unsigned)
 Copied from CRM [id number]. Topic: Clinical - Medication Refill >> Jan 27, 2024 12:56 PM Shamecia H wrote: Medication: clonazePAM  (KLONOPIN ) 1 MG tablet  Has the patient contacted their pharmacy? Yes (Agent: If no, request that the patient contact the pharmacy for the refill. If patient does not wish to contact the pharmacy document the reason why and proceed with request.) (Agent: If yes, when and what did the pharmacy advise?)  This is the patient's preferred pharmacy:  [id number] [address], [place] - [address] [place] [address] Phone: [phone] Fax: [phone]   Is this the correct pharmacy for this prescription? Yes If no, delete pharmacy and type the correct one.   Has the prescription been filled recently? Yes  Is the patient out of the medication? Yes  Has the patient been seen for an appointment in the last year OR does the patient have an upcoming appointment? Yes  Can we respond through MyChart? Yes  Agent: Please be advised that Rx refills may take up to 3 business days. We ask that you follow-up with your pharmacy.

## 2024-01-27 NOTE — Telephone Encounter (Signed)
 Clonazepam , did not pend as there are two orders with different instructions.

## 2024-01-29 ENCOUNTER — Encounter: Payer: Self-pay | Admitting: Hematology

## 2024-01-30 ENCOUNTER — Other Ambulatory Visit (HOSPITAL_COMMUNITY): Payer: Self-pay

## 2024-01-31 ENCOUNTER — Other Ambulatory Visit: Payer: Self-pay

## 2024-01-31 ENCOUNTER — Other Ambulatory Visit (HOSPITAL_COMMUNITY): Payer: Self-pay

## 2024-01-31 ENCOUNTER — Encounter: Payer: Self-pay | Admitting: Hematology

## 2024-02-01 ENCOUNTER — Other Ambulatory Visit: Payer: Self-pay

## 2024-02-01 ENCOUNTER — Other Ambulatory Visit (HOSPITAL_COMMUNITY): Payer: Self-pay

## 2024-02-01 DIAGNOSIS — C7949 Secondary malignant neoplasm of other parts of nervous system: Secondary | ICD-10-CM

## 2024-02-01 DIAGNOSIS — C3490 Malignant neoplasm of unspecified part of unspecified bronchus or lung: Secondary | ICD-10-CM

## 2024-02-01 NOTE — Progress Notes (Signed)
 Specialty Pharmacy Refill Coordination Note  Karen [name] is a 58 y.o. female contacted today regarding refills of specialty medication(s) Dabrafenib Mesylate  (TAFINLAR ); Trametinib  Dimethyl Sulfoxide  (MEKINIST )   Patient requested Pickup at [facility] [place] date: 02/02/24   Medication will be filled on 02/02/24.

## 2024-02-02 ENCOUNTER — Other Ambulatory Visit: Payer: Self-pay

## 2024-02-02 ENCOUNTER — Other Ambulatory Visit (HOSPITAL_COMMUNITY): Payer: Self-pay

## 2024-02-02 NOTE — Progress Notes (Signed)
(  8.14 CA): ATTEMPTED TO REACH PATIENT'S HUSBAND. NO VM. MEDICATION HAS TO BE ORDERED. NEW PICK UP DATE IS 8.15.25

## 2024-02-03 ENCOUNTER — Other Ambulatory Visit: Payer: Self-pay

## 2024-02-03 ENCOUNTER — Other Ambulatory Visit (HOSPITAL_COMMUNITY): Payer: Self-pay

## 2024-02-05 ENCOUNTER — Emergency Department (HOSPITAL_COMMUNITY)

## 2024-02-05 ENCOUNTER — Emergency Department (HOSPITAL_COMMUNITY)
Admission: EM | Admit: 2024-02-05 | Discharge: 2024-02-05 | Disposition: A | Discharge status: Home / Self Care | Attending: Emergency Medicine | Admitting: Emergency Medicine

## 2024-02-05 DIAGNOSIS — R519 Headache, unspecified: Secondary | ICD-10-CM | POA: Diagnosis present

## 2024-02-05 DIAGNOSIS — Z85118 Personal history of other malignant neoplasm of bronchus and lung: Secondary | ICD-10-CM | POA: Insufficient documentation

## 2024-02-05 LAB — CBC WITH DIFFERENTIAL/PLATELET
Abs Immature Granulocytes: 0.04 K/uL (ref 0.00–0.07)
Basophils Absolute: 0.1 K/uL (ref 0.0–0.1)
Basophils Relative: 1 %
Eosinophils Absolute: 0.2 K/uL (ref 0.0–0.5)
Eosinophils Relative: 2 %
HCT: 35.3 % — ABNORMAL LOW (ref 36.0–46.0)
Hemoglobin: 11.3 g/dL — ABNORMAL LOW (ref 12.0–15.0)
Immature Granulocytes: 0 %
Lymphocytes Relative: 37 %
Lymphs Abs: 3.6 K/uL (ref 0.7–4.0)
MCH: 28.4 pg (ref 26.0–34.0)
MCHC: 32 g/dL (ref 30.0–36.0)
MCV: 88.7 fL (ref 80.0–100.0)
Monocytes Absolute: 0.5 K/uL (ref 0.1–1.0)
Monocytes Relative: 5 %
Neutro Abs: 5.4 K/uL (ref 1.7–7.7)
Neutrophils Relative %: 55 %
Platelets: 361 K/uL (ref 150–400)
RBC: 3.98 MIL/uL (ref 3.87–5.11)
RDW: 15.7 % — ABNORMAL HIGH (ref 11.5–15.5)
WBC: 9.8 K/uL (ref 4.0–10.5)
nRBC: 0 % (ref 0.0–0.2)

## 2024-02-05 LAB — COMPREHENSIVE METABOLIC PANEL WITH GFR
ALT: 21 U/L (ref 0–44)
AST: 35 U/L (ref 15–41)
Albumin: 4 g/dL (ref 3.5–5.0)
Alkaline Phosphatase: 106 U/L (ref 38–126)
Anion gap: 10 (ref 5–15)
BUN: 16 mg/dL (ref 6–20)
CO2: 24 mmol/L (ref 22–32)
Calcium: 10 mg/dL (ref 8.9–10.3)
Chloride: 105 mmol/L (ref 98–111)
Creatinine, Ser: 0.81 mg/dL (ref 0.44–1.00)
GFR, Estimated: >60 mL/min (ref >60)
Glucose, Bld: 112 mg/dL — ABNORMAL HIGH (ref 70–99)
Potassium: 3.6 mmol/L (ref 3.5–5.1)
Sodium: 139 mmol/L (ref 135–145)
Total Bilirubin: 0.3 mg/dL (ref 0.0–1.2)
Total Protein: 8.8 g/dL — ABNORMAL HIGH (ref 6.5–8.1)

## 2024-02-05 LAB — LIPASE, BLOOD: Lipase: 51 U/L (ref 11–51)

## 2024-02-05 MED ORDER — DEXAMETHASONE SODIUM PHOSPHATE 10 MG/ML IJ SOLN
10.0000 mg | Freq: Once | INTRAMUSCULAR | Status: AC
  Administered 2024-02-05: 10 mg via INTRAVENOUS
  Filled 2024-02-05: qty 1

## 2024-02-05 MED ORDER — PROCHLORPERAZINE EDISYLATE 10 MG/2ML IJ SOLN
10.0000 mg | Freq: Once | INTRAMUSCULAR | Status: AC
  Administered 2024-02-05: 10 mg via INTRAVENOUS
  Filled 2024-02-05: qty 2

## 2024-02-05 MED ORDER — SODIUM CHLORIDE 0.9 % IV BOLUS
1000.0000 mL | Freq: Once | INTRAVENOUS | Status: AC
  Administered 2024-02-05: 1000 mL via INTRAVENOUS

## 2024-02-05 MED ORDER — DIPHENHYDRAMINE HCL 50 MG/ML IJ SOLN
25.0000 mg | Freq: Once | INTRAMUSCULAR | Status: AC
  Administered 2024-02-05: 25 mg via INTRAVENOUS
  Filled 2024-02-05: qty 1

## 2024-02-05 NOTE — ED Triage Notes (Signed)
 Pt arrived via POV for headache xs 6 days and vomiting everyday. Pt has had no relief using at home medication. 10/10 pain Hx lung cancer.

## 2024-02-05 NOTE — Discharge Instructions (Addendum)
 Acuda a una cita de seguimiento con su onclogo y su mdico de cabecera para hablar sobre sus cefaleas recurrentes. Por favor, acuda a su oftalmlogo/optometrista para hablar [employer] en su visin, segn las indicaciones previas de su onclogo/mdico de cabecera. Regrese a urgencias si sus sntomas empeoran. Contine con Imitrex  segn las indicaciones previas para las cefaleas.

## 2024-02-05 NOTE — ED Provider Notes (Signed)
 [place] [facility]MENT AT [facility] Provider Note   CSN: [id number] Arrival date & time: 02/05/24  1434     Patient presents with: Headache and Emesis   Karen [name] is a 58 y.o. female.   58 year old female presenting with headache, vomiting.  Patient has lung cancer with brain mets, has been taking Imitrex /hydrocodone /Tylenol /ibuprofen  with minimal relief of her headache, tells me that her headache has been ongoing for 6 days.  She tells me that she has also had 1 episode of vomiting a day for the past 6 days.  She tells me that she has felt feverish at times, although she has not checked her temperature but tells me last night she felt warm.  She also tells me that for 2 months her vision has been blurry, L eye > R eye, she tells me she has seen an eye doctor for this in the past and completed therapy for her vision although it did not resolve her visual problems entirely, she is unable to provide additional details about this.  She notes generalized abdominal pain, primarily around episodes of vomiting.  She has not   Headache Associated symptoms: vomiting   Emesis Associated symptoms: headaches        Prior to Admission medications   Medication Sig Start Date End Date Taking? Authorizing Provider  albuterol  (VENTOLIN  HFA) 108 (90 Base) MCG/ACT inhaler Inhale 2 puffs into the lungs See admin instructions. Inhale 2 puffs into the lungs every 2-4 hours as needed for shortness of breath or wheezing 06/01/23   Atway, Rayann N, DO  atorvastatin  (LIPITOR) 40 MG tablet TOME 1 TABLETA POR VIA ORAL TODOS LOS DIAS 09/28/23   Sethi, Pramod S, MD  clonazePAM  (KLONOPIN ) 1 MG tablet Take 1 tablet (1 mg total) by mouth 2 (two) times daily. 11/19/23   Tobie Gaines, DO  clonazePAM  (KLONOPIN ) 1 MG tablet Take 1 tablet (1 mg total) by mouth daily. 12/28/23   Amoako, Prince, MD  dabrafenib mesylate  (TAFINLAR ) 75 MG capsule Take 2 capsules (150 mg total) by mouth 2 (two) times  daily. Take on an empty stomach 1 hour before or 2 hours after meals. 11/03/23   Onesimo Emaline Brink, MD  folic acid  (FOLVITE ) 1 MG tablet Take 1 tablet (1 mg total) by mouth daily. 09/07/23   Pickenpack-Cousar, Athena N, NP  HYDROcodone -acetaminophen  (NORCO/VICODIN) 5-325 MG tablet Take 1-2 tablets by mouth every 6 (six) hours as needed for moderate pain (pain score 4-6). 01/23/24   Pickenpack-Cousar, Athena N, NP  ondansetron  (ZOFRAN ) 4 MG tablet Take 1 tablet (4 mg total) by mouth every 6 (six) hours. 01/23/24   Pickenpack-Cousar, Fannie SAILOR, NP  polyethylene glycol powder (GLYCOLAX /MIRALAX ) 17 GM/SCOOP powder Take 17 g by mouth 2 (two) times daily. 10/12/23   Pickenpack-Cousar, Athena N, NP  potassium chloride  SA (KLOR-CON  M) 20 MEQ tablet Take 1 tablet (20 mEq total) by mouth daily. 01/23/24   Pickenpack-Cousar, Athena N, NP  prochlorperazine  (COMPAZINE ) 10 MG tablet Take 1 tablet (10 mg) by mouth every 6 hours as needed for refractory nausea / vomiting. 11/17/23   Tobie Gaines, DO  senna-docusate (SENOKOT-S) 8.6-50 MG tablet Take 1 tablet by mouth 2 (two) times daily. Patient not taking: Reported on 12/05/2023 09/07/23   Pickenpack-Cousar, Athena N, NP  sertraline  (ZOLOFT ) 100 MG tablet Take 1 tablet (100 mg total) by mouth 2 (two) times daily. 09/13/22   Regalado, Belkys A, MD  SUMAtriptan  (IMITREX ) 50 MG tablet Take 1 tablet by mouth once as  needed for migraine. May repeat in 2 hours if headache persists or recurs. 08/01/23   Pickenpack-Cousar, Athena N, NP  trametinib  dimethyl sulfoxide  (MEKINIST ) 2 MG tablet Take 1 tablet (2 mg total) by mouth daily. Take 1 hour before or 2 hours after a meal. Store in original container. 11/03/23   Onesimo Emaline Brink, MD  traZODone  (DESYREL ) 150 MG tablet TOME UNA TABLETA POR VIA ORAL AL ACOSTARSE CUANDO SEA NECESARIO FOR SLEEP 09/14/22   Mapp, Tavien, MD    Allergies: Percocet [oxycodone -acetaminophen ], Hydromorphone  hcl, Lidocaine , and Other    Review of Systems   Gastrointestinal:  Positive for vomiting.  Neurological:  Positive for headaches.    Updated Vital Signs  Vitals:   02/05/24 1440  BP: 125/88  Pulse: 84  Resp: 18  Temp: 98.5 F (36.9 C)  TempSrc: Oral  SpO2: 94%  Weight: 47.2 kg  Height: 5' 1 (1.549 m)     Physical Exam Vitals and nursing note reviewed.  HENT:     Head: Normocephalic.  Eyes:     Extraocular Movements: Extraocular movements intact.     Pupils: Pupils are equal, round, and reactive to light.     Comments: Mild horizontal nystagmus noted with lateral gaze on EOM's  Cardiovascular:     Rate and Rhythm: Normal rate.  Pulmonary:     Effort: Pulmonary effort is normal.  Abdominal:     Palpations: Abdomen is soft.     Tenderness: There is no abdominal tenderness. There is no guarding.  Musculoskeletal:     Comments: 5/5 strength against resistance of bilateral upper and lower extremities  Skin:    General: Skin is warm and dry.  Neurological:     General: No focal deficit present.     Mental Status: She is oriented to person, place, and time.     Sensory: No sensory deficit.     Motor: No weakness.     Comments: Facial expressions are intact and symmetric without evidence of facial droop Normal cerebellar testing including finger-to-nose     (all labs ordered are listed, but only abnormal results are displayed) Labs Reviewed  CBC WITH DIFFERENTIAL/PLATELET - Abnormal; Notable for the following components:      Result Value   Hemoglobin 11.3 (*)    HCT 35.3 (*)    RDW 15.7 (*)    All other components within normal limits  COMPREHENSIVE METABOLIC PANEL WITH GFR    EKG: None  Radiology: No results found.   Procedures   Medications Ordered in the ED  prochlorperazine  (COMPAZINE ) injection 10 mg (10 mg Intravenous Given 02/05/24 1557)  diphenhydrAMINE  (BENADRYL ) injection 25 mg (25 mg Intravenous Given 02/05/24 1557)  sodium chloride  0.9 % bolus 1,000 mL (1,000 mLs Intravenous New Bag/Given  02/05/24 1556)  dexamethasone  (DECADRON ) injection 10 mg (10 mg Intravenous Given 02/05/24 1557)                                    Medical Decision Making This patient presents to the ED for concern of headache, this involves an extensive number of treatment options, and is a complaint that carries with it a high risk of complications and morbidity.  The differential diagnosis includes migraine, tension headache, intracranial hemorrhage, stroke versus TIA, brain metastasis   Co morbidities that complicate the patient evaluation  Lung cancer with brain mets   Additional history obtained:  Additional history obtained from review External  records from outside source obtained and reviewed including recent palliative care and oncology notes   Lab Tests:  I Ordered, and personally interpreted labs.  The pertinent results include: CBC largely stable as compared to previous, she does not have a true leukocytosis however her white blood cell count is 9.8 today and it appears that her baseline is closer to 4.1-7.1 over the past 7 months.  CMP is largely stable as compared to previous. Lipase within normal limits   Imaging Studies ordered:  I ordered imaging studies including CT head without contrast I independently visualized and interpreted imaging which showed 1. No acute intracranial abnormality. 2. Unchanged size of right temporal nodule (known metastasis), which appears calcified by CT. No surrounding mass effect. 3. Mild chronic small vessel ischemia.  I agree with the radiologist interpretation   Cardiac Monitoring: / EKG:  The patient was maintained on a cardiac monitor.  I personally viewed and interpreted the cardiac monitored which showed an underlying rhythm of: NSR    Problem List / ED Course / Critical interventions / Medication management  I ordered medication including Decadron , Benadryl , Compazine  for migraine cocktail, IV fluids for rehydration Reevaluation of the  patient after these medicines showed that the patient improved I have reviewed the patients home medicines and have made adjustments as needed   Social Determinants of Health:  Depression   Test / Admission - Considered:  Physical exam notable as above, patient has no focal neurodeficits on exam but does tell me she has had approximately 2 months of blurred vision, with the left eye being more blurred than the right, she tells me that this is unchanged and after record review it appears that she has discussed this with her palliative medicine provider and they plan to refer her again to an ophthalmologist/optometrist. Visual acuity checked by nursing staff and is as follows: Bilateral Near: 20/100 Bilateral Distance: 20/200R Near: 20/100R Distance: 20/200L Near: 20/100L Distance: 20/200.  Patient wears glasses but does not have them with her today.  I have no baseline visual acuity for comparison.  Migraine cocktail given, head CT stable as compared to previous.  I suspect that patient's headache is likely secondary to metastasis from her lung cancer versus migraine, however upon review of her most recent oncology note there is some conflicting information, in the assessment and plan her provider states MRI of the brain was negative for brain mets, however in the MRI report further down in the note the MRI report is as follows: 1. Unchanged appearance of 5 mm metastasis  in the right temporal lobe. 2. No other enhancing metastases. 3. Multifocal hyperintense T2-weighted signal within the cerebral white matter, most commonly due to chronic small vessel disease..   At time of my reassessment, the patient reports significant improvement in her headache since receiving the migraine cocktail and IV fluids earlier, says she is feeling much better, she has had no episodes of vomiting since presenting to the emergency department today.  I discussed the reassuring workup as above, I recommend that she  follow-up with her oncologist/palliative care specialist to discuss her recurrent headaches, based on my review of their recent notes they are aware of her worsening patient and have referred her to ophthalmology/she has been seen by ophthalmology in the past, I encouraged her to reschedule an appointment with them for further assessment of her visual changes.  She voiced understanding and is in agreement with this plan.  Patient is prescribed Imitrex  for migraines, among other medications  for pain control.  I encouraged her to continue these as previously directed.  Strict return precautions discussed, she is appropriate for discharge at this time     Amount and/or Complexity of Data Reviewed Labs: ordered. Radiology: ordered.  Risk Prescription drug management.        Final diagnoses:  Nonintractable headache, unspecified chronicity pattern, unspecified headache type    ED Discharge Orders     None          Karen [name], [place] 02/05/24 1930    Karen Sid SAILOR, MD 02/07/24 [phone]

## 2024-02-16 ENCOUNTER — Other Ambulatory Visit: Payer: Self-pay

## 2024-02-27 ENCOUNTER — Other Ambulatory Visit: Payer: Self-pay | Admitting: Student

## 2024-02-27 MED ORDER — CLONAZEPAM 1 MG PO TABS: 1.0000 mg | ORAL_TABLET | Freq: Every day | ORAL | 0 refills | Status: DC

## 2024-02-27 NOTE — Telephone Encounter (Unsigned)
 Copied from CRM [id number]. Topic: Clinical - Medication Refill >> Feb 27, 2024 11:35 AM Diannia H wrote: Medication: clonazePAM  (KLONOPIN ) 1 MG tablet  Has the patient contacted their pharmacy? Yes (Agent: If no, request that the patient contact the pharmacy for the refill. If patient does not wish to contact the pharmacy document the reason why and proceed with request.) (Agent: If yes, when and what did the pharmacy advise?)  This is the patient's preferred pharmacy:  [id number] [place], [place] - [address] [place] [address] Phone: [phone] Fax: [phone]  Is this the correct pharmacy for this prescription? Yes If no, delete pharmacy and type the correct one.   Has the prescription been filled recently? No  Is the patient out of the medication? Yes  Has the patient been seen for an appointment in the last year OR does the patient have an upcoming appointment? Yes  Can we respond through MyChart? Yes  Agent: Please be advised that Rx refills may take up to 3 business days. We ask that you follow-up with your pharmacy.

## 2024-02-28 ENCOUNTER — Other Ambulatory Visit: Payer: Self-pay

## 2024-02-28 ENCOUNTER — Other Ambulatory Visit: Payer: Self-pay | Admitting: Hematology

## 2024-02-28 DIAGNOSIS — C3490 Malignant neoplasm of unspecified part of unspecified bronchus or lung: Secondary | ICD-10-CM

## 2024-02-29 ENCOUNTER — Other Ambulatory Visit: Payer: Self-pay

## 2024-02-29 MED ORDER — DABRAFENIB MESYLATE 75 MG PO CAPS
150.0000 mg | ORAL_CAPSULE | Freq: Two times a day (BID) | ORAL | 2 refills | Status: DC
  Filled 2024-02-29 – 2024-03-01 (×2): qty 120, 30d supply, fill #0
  Filled 2024-04-03 – 2024-04-12 (×3): qty 120, 30d supply, fill #1
  Filled 2024-05-03 – 2024-05-07 (×2): qty 120, 30d supply, fill #2

## 2024-02-29 MED ORDER — TRAMETINIB DIMETHYL SULFOXIDE 2 MG PO TABS
2.0000 mg | ORAL_TABLET | Freq: Every day | ORAL | 2 refills | Status: DC
  Filled 2024-03-01 (×2): qty 30, 30d supply, fill #0
  Filled 2024-04-03 – 2024-04-12 (×3): qty 30, 30d supply, fill #1
  Filled 2024-05-03 – 2024-05-07 (×2): qty 30, 30d supply, fill #2

## 2024-03-01 ENCOUNTER — Other Ambulatory Visit: Payer: Self-pay

## 2024-03-01 NOTE — Progress Notes (Signed)
 Specialty Pharmacy Refill Coordination Note  Karen [name] is a 58 y.o. female contacted today regarding refills of specialty medication(s) Dabrafenib Mesylate  (TAFINLAR ); Trametinib  Dimethyl Sulfoxide  (MEKINIST )   Patient requested Pickup at [facility] at [place] date: 03/06/24   Medication will be filled on 03/02/24.

## 2024-03-02 ENCOUNTER — Other Ambulatory Visit: Payer: Self-pay

## 2024-03-02 ENCOUNTER — Other Ambulatory Visit (HOSPITAL_COMMUNITY): Payer: Self-pay

## 2024-03-07 ENCOUNTER — Telehealth: Payer: Self-pay | Admitting: Nurse Practitioner

## 2024-03-07 NOTE — Telephone Encounter (Signed)
 Scheduled appointment per staff message. Called and left a VM with the appointment details for the patient using the interpreter line.

## 2024-03-16 ENCOUNTER — Other Ambulatory Visit: Payer: Self-pay

## 2024-03-20 ENCOUNTER — Other Ambulatory Visit: Payer: Self-pay

## 2024-03-20 NOTE — Progress Notes (Signed)
 Specialty Pharmacy Ongoing Clinical Assessment Note  Karen [name] is a 58 y.o. female who is being followed by the specialty pharmacy service for RxSp Oncology   Patient's specialty medication(s) reviewed today: Dabrafenib Mesylate  (TAFINLAR ); Trametinib  Dimethyl Sulfoxide  (MEKINIST )   Missed doses in the last 4 weeks: 0   Patient/Caregiver did not have any additional questions or concerns.   Therapeutic benefit summary: Patient is achieving benefit   Adverse events/side effects summary: Experienced adverse events/side effects (per husband, continued fatigue and constipation. recommended increasing miralax  to twice daily which he will have patient try for relief)   Patient's therapy is appropriate to: Continue    Goals Addressed             This Visit's Progress    Slow Disease Progression       Patient is on track. Patient will maintain adherence. Per notes from 01/23/24, scans from July showed no evidence of recurrence or new metastases.         Follow up: 3 months  Camrynn Mcclintic M Dayne Chait Specialty Pharmacist

## 2024-03-22 ENCOUNTER — Encounter: Payer: Self-pay | Admitting: Hematology

## 2024-03-23 ENCOUNTER — Other Ambulatory Visit (HOSPITAL_COMMUNITY): Payer: Self-pay

## 2024-03-23 ENCOUNTER — Other Ambulatory Visit: Payer: Self-pay | Admitting: Nurse Practitioner

## 2024-03-23 ENCOUNTER — Other Ambulatory Visit: Payer: Self-pay

## 2024-03-23 DIAGNOSIS — Z515 Encounter for palliative care: Secondary | ICD-10-CM

## 2024-03-23 DIAGNOSIS — G893 Neoplasm related pain (acute) (chronic): Secondary | ICD-10-CM

## 2024-03-23 DIAGNOSIS — C3490 Malignant neoplasm of unspecified part of unspecified bronchus or lung: Secondary | ICD-10-CM

## 2024-03-23 MED ORDER — HYDROCODONE-ACETAMINOPHEN 5-325 MG PO TABS
1.0000 | ORAL_TABLET | Freq: Four times a day (QID) | ORAL | 0 refills | Status: DC | PRN
  Filled 2024-03-23 – 2024-04-06 (×2): qty 60, 8d supply, fill #0

## 2024-03-24 ENCOUNTER — Other Ambulatory Visit (HOSPITAL_COMMUNITY): Payer: Self-pay

## 2024-03-28 ENCOUNTER — Inpatient Hospital Stay: Attending: Hematology | Admitting: Nurse Practitioner

## 2024-04-03 ENCOUNTER — Other Ambulatory Visit (HOSPITAL_COMMUNITY): Payer: Self-pay

## 2024-04-05 ENCOUNTER — Other Ambulatory Visit (HOSPITAL_COMMUNITY): Payer: Self-pay

## 2024-04-06 ENCOUNTER — Other Ambulatory Visit: Payer: Self-pay

## 2024-04-06 ENCOUNTER — Other Ambulatory Visit (HOSPITAL_COMMUNITY): Payer: Self-pay

## 2024-04-09 ENCOUNTER — Other Ambulatory Visit (HOSPITAL_COMMUNITY): Payer: Self-pay

## 2024-04-09 ENCOUNTER — Other Ambulatory Visit: Payer: Self-pay

## 2024-04-09 ENCOUNTER — Emergency Department (HOSPITAL_COMMUNITY)

## 2024-04-09 ENCOUNTER — Encounter (HOSPITAL_COMMUNITY): Payer: Self-pay | Admitting: Emergency Medicine

## 2024-04-09 ENCOUNTER — Observation Stay (HOSPITAL_COMMUNITY)
Admission: EM | Admit: 2024-04-09 | Discharge: 2024-04-11 | Disposition: A | Discharge status: Home / Self Care | Attending: Internal Medicine | Admitting: Internal Medicine

## 2024-04-09 DIAGNOSIS — E785 Hyperlipidemia, unspecified: Secondary | ICD-10-CM | POA: Diagnosis present

## 2024-04-09 DIAGNOSIS — Z79899 Other long term (current) drug therapy: Secondary | ICD-10-CM | POA: Diagnosis not present

## 2024-04-09 DIAGNOSIS — F1090 Alcohol use, unspecified, uncomplicated: Secondary | ICD-10-CM | POA: Diagnosis not present

## 2024-04-09 DIAGNOSIS — G43709 Chronic migraine without aura, not intractable, without status migrainosus: Secondary | ICD-10-CM | POA: Diagnosis not present

## 2024-04-09 DIAGNOSIS — F32A Depression, unspecified: Secondary | ICD-10-CM | POA: Insufficient documentation

## 2024-04-09 DIAGNOSIS — G894 Chronic pain syndrome: Secondary | ICD-10-CM | POA: Diagnosis present

## 2024-04-09 DIAGNOSIS — I10 Essential (primary) hypertension: Secondary | ICD-10-CM | POA: Insufficient documentation

## 2024-04-09 DIAGNOSIS — R112 Nausea with vomiting, unspecified: Principal | ICD-10-CM | POA: Diagnosis present

## 2024-04-09 DIAGNOSIS — C3490 Malignant neoplasm of unspecified part of unspecified bronchus or lung: Secondary | ICD-10-CM | POA: Diagnosis not present

## 2024-04-09 DIAGNOSIS — F419 Anxiety disorder, unspecified: Secondary | ICD-10-CM | POA: Diagnosis present

## 2024-04-09 DIAGNOSIS — Z8615 Personal history of latent tuberculosis infection: Secondary | ICD-10-CM | POA: Diagnosis not present

## 2024-04-09 DIAGNOSIS — R110 Nausea: Secondary | ICD-10-CM

## 2024-04-09 LAB — CBC WITH DIFFERENTIAL/PLATELET
Abs Immature Granulocytes: 0.01 K/uL (ref 0.00–0.07)
Basophils Absolute: 0 K/uL (ref 0.0–0.1)
Basophils Relative: 1 %
Eosinophils Absolute: 0.1 K/uL (ref 0.0–0.5)
Eosinophils Relative: 2 %
HCT: 29.4 % — ABNORMAL LOW (ref 36.0–46.0)
Hemoglobin: 9.4 g/dL — ABNORMAL LOW (ref 12.0–15.0)
Immature Granulocytes: 0 %
Lymphocytes Relative: 18 %
Lymphs Abs: 0.8 K/uL (ref 0.7–4.0)
MCH: 28 pg (ref 26.0–34.0)
MCHC: 32 g/dL (ref 30.0–36.0)
MCV: 87.5 fL (ref 80.0–100.0)
Monocytes Absolute: 0.4 K/uL (ref 0.1–1.0)
Monocytes Relative: 9 %
Neutro Abs: 3.1 K/uL (ref 1.7–7.7)
Neutrophils Relative %: 70 %
Platelets: 193 K/uL (ref 150–400)
RBC: 3.36 MIL/uL — ABNORMAL LOW (ref 3.87–5.11)
RDW: 14.4 % (ref 11.5–15.5)
WBC: 4.5 K/uL (ref 4.0–10.5)
nRBC: 0 % (ref 0.0–0.2)

## 2024-04-09 LAB — URINALYSIS, ROUTINE W REFLEX MICROSCOPIC
Bilirubin Urine: NEGATIVE
Glucose, UA: NEGATIVE mg/dL
Hgb urine dipstick: NEGATIVE
Ketones, ur: NEGATIVE mg/dL
Leukocytes,Ua: NEGATIVE
Nitrite: NEGATIVE
Protein, ur: NEGATIVE mg/dL
Specific Gravity, Urine: 1.032 — ABNORMAL HIGH (ref 1.005–1.030)
pH: 6 (ref 5.0–8.0)

## 2024-04-09 LAB — COMPREHENSIVE METABOLIC PANEL WITH GFR
ALT: 12 U/L (ref 0–44)
AST: 33 U/L (ref 15–41)
Albumin: 3.5 g/dL (ref 3.5–5.0)
Alkaline Phosphatase: 94 U/L (ref 38–126)
Anion gap: 9 (ref 5–15)
BUN: 15 mg/dL (ref 6–20)
CO2: 23 mmol/L (ref 22–32)
Calcium: 9.7 mg/dL (ref 8.9–10.3)
Chloride: 103 mmol/L (ref 98–111)
Creatinine, Ser: 0.82 mg/dL (ref 0.44–1.00)
GFR, Estimated: >60 mL/min (ref >60)
Glucose, Bld: 98 mg/dL (ref 70–99)
Potassium: 4.2 mmol/L (ref 3.5–5.1)
Sodium: 135 mmol/L (ref 135–145)
Total Bilirubin: 0.3 mg/dL (ref 0.0–1.2)
Total Protein: 6.8 g/dL (ref 6.5–8.1)

## 2024-04-09 LAB — POC OCCULT BLOOD, ED: Fecal Occult Bld: NEGATIVE

## 2024-04-09 LAB — RESP PANEL BY RT-PCR (RSV, FLU A&B, COVID)  RVPGX2
Influenza A by PCR: NEGATIVE
Influenza B by PCR: NEGATIVE
Resp Syncytial Virus by PCR: NEGATIVE
SARS Coronavirus 2 by RT PCR: NEGATIVE

## 2024-04-09 LAB — LIPASE, BLOOD: Lipase: 53 U/L — ABNORMAL HIGH (ref 11–51)

## 2024-04-09 MED ORDER — LACTATED RINGERS IV SOLN: INTRAVENOUS | Status: DC

## 2024-04-09 MED ORDER — IOHEXOL 300 MG/ML  SOLN
100.0000 mL | Freq: Once | INTRAMUSCULAR | Status: AC | PRN
  Administered 2024-04-09: 75 mL via INTRAVENOUS

## 2024-04-09 MED ORDER — LACTATED RINGERS IV BOLUS
1000.0000 mL | Freq: Once | INTRAVENOUS | Status: AC
  Administered 2024-04-09: 1000 mL via INTRAVENOUS

## 2024-04-09 MED ORDER — ONDANSETRON HCL 4 MG/2ML IJ SOLN
4.0000 mg | Freq: Once | INTRAMUSCULAR | Status: AC
  Administered 2024-04-09: 4 mg via INTRAVENOUS
  Filled 2024-04-09: qty 2

## 2024-04-09 MED ORDER — SODIUM CHLORIDE 0.9 % IV SOLN
12.5000 mg | Freq: Once | INTRAVENOUS | Status: AC
  Administered 2024-04-09: 12.5 mg via INTRAVENOUS
  Filled 2024-04-09: qty 12.5

## 2024-04-09 MED ORDER — MORPHINE SULFATE (PF) 4 MG/ML IV SOLN
4.0000 mg | Freq: Once | INTRAVENOUS | Status: AC
  Administered 2024-04-09: 4 mg via INTRAVENOUS
  Filled 2024-04-09: qty 1

## 2024-04-09 NOTE — ED Triage Notes (Signed)
 Patient arrives with spouse who reports patient unable to eat for 1 week and vomiting, diarrhea, LLQ pain, night sweats, and headache for 5 weeks. Diagnosed with stage 4 lung cancer 2 years prior, reports taking oral treatment daily.
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
## 2024-04-09 NOTE — ED Notes (Signed)
 EDP at [facility] for rectal exam/ hemmoccult

## 2024-04-09 NOTE — ED Notes (Signed)
 Pt ambulated to restroom.

## 2024-04-09 NOTE — ED Notes (Signed)
Up to b/r for urine sample, steady gait. 

## 2024-04-09 NOTE — ED Provider Notes (Signed)
 Patient here with uncontrollable nausea and vomiting for the last week with diarrhea.  She has stage IV lung cancer on oral chemotherapy.  She is not able to tolerate p.o.  Lab work and imaging today is unremarkable.  She still very symptomatic despite antiemetics fluids and pain meds here.  Threw up her p.o. challenge.  Of note she also does have chronic abdominal pain IBS and possibly some chronic vomiting syndrome.  Overall I think it be reasonable to admit her for observation for symptomatic support.  Will give her IV Phenergan  and start some maintenance fluids.  Patient does not feel comfortable going home.  Will send out stool pathogen and C. difficile studies if she can provide us  a stool sample.  Will admit to hospitalist.  This chart was dictated using voice recognition software.  Despite best efforts to proofread,  errors can occur which can change the documentation meaning.    Ruthe Cornet, DO 04/09/24 [phone]

## 2024-04-09 NOTE — ED Notes (Signed)
 EDP at [facility]
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
## 2024-04-10 DIAGNOSIS — R112 Nausea with vomiting, unspecified: Secondary | ICD-10-CM | POA: Diagnosis not present

## 2024-04-10 LAB — COMPREHENSIVE METABOLIC PANEL WITH GFR
ALT: 12 U/L (ref 0–44)
AST: 31 U/L (ref 15–41)
Albumin: 3.4 g/dL — ABNORMAL LOW (ref 3.5–5.0)
Alkaline Phosphatase: 94 U/L (ref 38–126)
Anion gap: 10 (ref 5–15)
BUN: 8 mg/dL (ref 6–20)
CO2: 24 mmol/L (ref 22–32)
Calcium: 9.2 mg/dL (ref 8.9–10.3)
Chloride: 102 mmol/L (ref 98–111)
Creatinine, Ser: 0.82 mg/dL (ref 0.44–1.00)
GFR, Estimated: >60 mL/min (ref >60)
Glucose, Bld: 81 mg/dL (ref 70–99)
Potassium: 4 mmol/L (ref 3.5–5.1)
Sodium: 136 mmol/L (ref 135–145)
Total Bilirubin: 0.3 mg/dL (ref 0.0–1.2)
Total Protein: 6.7 g/dL (ref 6.5–8.1)

## 2024-04-10 LAB — CBC
HCT: 30.6 % — ABNORMAL LOW (ref 36.0–46.0)
Hemoglobin: 9.8 g/dL — ABNORMAL LOW (ref 12.0–15.0)
MCH: 27.8 pg (ref 26.0–34.0)
MCHC: 32 g/dL (ref 30.0–36.0)
MCV: 86.9 fL (ref 80.0–100.0)
Platelets: 232 K/uL (ref 150–400)
RBC: 3.52 MIL/uL — ABNORMAL LOW (ref 3.87–5.11)
RDW: 14.4 % (ref 11.5–15.5)
WBC: 4.6 K/uL (ref 4.0–10.5)
nRBC: 0 % (ref 0.0–0.2)

## 2024-04-10 LAB — HIV ANTIBODY (ROUTINE TESTING W REFLEX): HIV Screen 4th Generation wRfx: NONREACTIVE

## 2024-04-10 MED ORDER — ORAL CARE MOUTH RINSE: 15.0000 mL | OROMUCOSAL | Status: DC | PRN

## 2024-04-10 MED ORDER — CLONAZEPAM 0.5 MG PO TABS
1.0000 mg | ORAL_TABLET | Freq: Every day | ORAL | Status: DC
  Administered 2024-04-10 – 2024-04-11 (×2): 1 mg via ORAL
  Filled 2024-04-10 (×2): qty 2

## 2024-04-10 MED ORDER — SERTRALINE HCL 100 MG PO TABS
100.0000 mg | ORAL_TABLET | Freq: Every day | ORAL | Status: DC
  Administered 2024-04-10 – 2024-04-11 (×2): 100 mg via ORAL
  Filled 2024-04-10 (×2): qty 1

## 2024-04-10 MED ORDER — MELATONIN 5 MG PO TABS: 5.0000 mg | ORAL_TABLET | Freq: Once | ORAL | Status: DC

## 2024-04-10 MED ORDER — ENOXAPARIN SODIUM 30 MG/0.3ML IJ SOSY
30.0000 mg | PREFILLED_SYRINGE | INTRAMUSCULAR | Status: DC
  Filled 2024-04-10: qty 0.3

## 2024-04-10 MED ORDER — ENSURE PLUS HIGH PROTEIN PO LIQD
237.0000 mL | Freq: Two times a day (BID) | ORAL | Status: DC
  Administered 2024-04-10: 237 mL via ORAL

## 2024-04-10 MED ORDER — LAMOTRIGINE 25 MG PO TABS
25.0000 mg | ORAL_TABLET | Freq: Every day | ORAL | Status: DC
  Administered 2024-04-10 – 2024-04-11 (×2): 25 mg via ORAL
  Filled 2024-04-10 (×2): qty 1

## 2024-04-10 MED ORDER — ONDANSETRON HCL 4 MG PO TABS
4.0000 mg | ORAL_TABLET | Freq: Four times a day (QID) | ORAL | Status: DC | PRN
  Administered 2024-04-10: 4 mg via ORAL
  Filled 2024-04-10: qty 1

## 2024-04-10 MED ORDER — METOPROLOL TARTRATE 5 MG/5ML IV SOLN: 5.0000 mg | Freq: Four times a day (QID) | INTRAVENOUS | Status: DC | PRN

## 2024-04-10 MED ORDER — ONDANSETRON HCL 4 MG/2ML IJ SOLN: 4.0000 mg | Freq: Four times a day (QID) | INTRAMUSCULAR | Status: DC | PRN

## 2024-04-10 MED ORDER — BUSPIRONE HCL 5 MG PO TABS
5.0000 mg | ORAL_TABLET | Freq: Three times a day (TID) | ORAL | Status: DC
Start: 2024-04-10 — End: 2024-04-11
  Administered 2024-04-11: 5 mg via ORAL
  Filled 2024-04-10 (×2): qty 1

## 2024-04-10 MED ORDER — DEXTROSE IN LACTATED RINGERS 5 % IV SOLN: INTRAVENOUS | Status: DC

## 2024-04-10 MED ORDER — SUMATRIPTAN SUCCINATE 50 MG PO TABS: 50.0000 mg | ORAL_TABLET | ORAL | Status: DC | PRN

## 2024-04-10 MED ORDER — INFLUENZA VIRUS VACC SPLIT PF (FLUZONE) 0.5 ML IM SUSY: 0.5000 mL | PREFILLED_SYRINGE | INTRAMUSCULAR | Status: DC

## 2024-04-10 MED ORDER — HYDROCODONE-ACETAMINOPHEN 5-325 MG PO TABS
1.0000 | ORAL_TABLET | Freq: Four times a day (QID) | ORAL | Status: DC | PRN
  Administered 2024-04-11: 1 via ORAL
  Filled 2024-04-10: qty 1

## 2024-04-10 MED ORDER — HYDROCODONE-ACETAMINOPHEN 5-325 MG PO TABS
1.0000 | ORAL_TABLET | ORAL | Status: DC | PRN
  Administered 2024-04-10: 1 via ORAL
  Filled 2024-04-10: qty 1

## 2024-04-10 MED ORDER — TRAZODONE HCL 50 MG PO TABS
100.0000 mg | ORAL_TABLET | Freq: Once | ORAL | Status: AC
  Administered 2024-04-10: 100 mg via ORAL
  Filled 2024-04-10: qty 2

## 2024-04-10 NOTE — ED Notes (Signed)
 Pt ambulated to restroom with steady gait.

## 2024-04-10 NOTE — Progress Notes (Signed)
 PROGRESS NOTE    Karen [name]  FMW:[id number] DOB: Jun 22, 1965 DOA: 04/09/2024 PCP: Renne Homans, MD   Brief Narrative:  Karen [name] is a 58 y.o. female with medical history significant of stage IV lung cancer, GERD, essential hypertension, irritable bowel syndrome, migraine headaches, latent tuberculosis, depression, who presents to the ER with spouse due to intractable nausea vomiting and diarrhea.   Assessment & Plan:   Principal Problem:   Intractable nausea and vomiting Active Problems:   Primary adenocarcinoma of lung (HCC)   Chronic pain syndrome   Anxiety   Hyperlipidemia   Nausea & vomiting  Intractable nausea with vomiting:  - Questionably secondary to current medication regimen versus malignancy. - Symptoms appear to be improving drastically tolerating clears and full liquid diet today -Will advance patient's diet as tolerated -IV fluids discontinued in the setting of increased p.o. intake -Continue supportive care, nausea vomiting appears to have resolved at this point on Zofran  -only requiring a single dose over the past 24 hours   Stage IV primary adenocarcinoma of the lung: Continue care per oncology. Chronic pain syndrome: Continue home hydrocodone   Anxiety disorder: Resume home clonazepam , Lamictal, buspirone Chronic migraine headaches: Symptomatic treatment with home sumatriptan  History of latent tuberculosis: Continue to monitor    DVT prophylaxis: enoxaparin  (LOVENOX ) injection 30 mg Start: 04/10/24 1000 Code Status:   Code Status: Full Code Family Communication: At bedside  Status is: Observation  Dispo: The patient is from: Home              Anticipated d/c is to: Home              Anticipated d/c date is: 24 hours              Patient currently is medically stable for discharge  Consultants:  None  Procedures:  None  Antimicrobials:  None  Subjective: No acute issues or events overnight nausea vomiting have resolved otherwise declines  headache fevers chills chest pain shortness of breath  Objective: Vitals:   04/10/24 0500 04/10/24 0600 04/10/24 0649 04/10/24 0654  BP: 116/73 116/75  104/74  Pulse: 71 63  77  Resp: 18 18  18   Temp:    98.6 F (37 C)  TempSrc:    Oral  SpO2: 98% 95%  96%  Weight:   44.6 kg   Height:   5' 1 (1.549 m)     Intake/Output Summary (Last 24 hours) at 04/10/2024 0744 Last data filed at 04/09/2024 1519 Gross per 24 hour  Intake 1350 ml  Output --  Net 1350 ml   Filed Weights   04/10/24 0649  Weight: 44.6 kg    Examination:  General:  Pleasantly resting in bed, No acute distress. HEENT:  Normocephalic atraumatic.  Sclerae nonicteric, noninjected.  Extraocular movements intact bilaterally. Neck:  Without mass or deformity.  Trachea is midline. Lungs:  Clear to auscultate bilaterally without rhonchi, wheeze, or rales. Heart:  Regular rate and rhythm.  Without murmurs, rubs, or gallops. Abdomen:  Soft, nontender, nondistended.  Without guarding or rebound. Extremities: Without cyanosis, clubbing, edema, or obvious deformity. Skin:  Warm and dry, no erythema.   Data Reviewed: I have personally reviewed following labs and imaging studies  CBC: Recent Labs  Lab 04/09/24 1429 04/10/24 0526  WBC 4.5 4.6  NEUTROABS 3.1  --   HGB 9.4* 9.8*  HCT 29.4* 30.6*  MCV 87.5 86.9  PLT 193 232   Basic Metabolic Panel: Recent Labs  Lab 04/09/24 1429 04/10/24  0526  NA 135 136  K 4.2 4.0  CL 103 102  CO2 23 24  GLUCOSE 98 81  BUN 15 8  CREATININE 0.82 0.82  CALCIUM  9.7 9.2   GFR: Estimated Creatinine Clearance: 52.7 mL/min (by C-G formula based on SCr of 0.82 mg/dL).  Liver Function Tests: Recent Labs  Lab 04/09/24 1429 04/10/24 0526  AST 33 31  ALT 12 12  ALKPHOS 94 94  BILITOT 0.3 0.3  PROT 6.8 6.7  ALBUMIN 3.5 3.4*   Recent Labs  Lab 04/09/24 1429  LIPASE 53*    Recent Results (from the past 240 hours)  Resp panel by RT-PCR (RSV, Flu A&B, Covid) Anterior  Nasal Swab     Status: None   Collection Time: 04/09/24  2:29 PM   Specimen: Anterior Nasal Swab  Result Value Ref Range Status   SARS Coronavirus 2 by RT PCR NEGATIVE NEGATIVE Final    Comment: (NOTE) SARS-CoV-2 target nucleic acids are NOT DETECTED.  The SARS-CoV-2 RNA is generally detectable in upper respiratory specimens during the acute phase of infection. The lowest concentration of SARS-CoV-2 viral copies this assay can detect is 138 copies/mL. A negative result does not preclude SARS-Cov-2 infection and should not be used as the sole basis for treatment or other patient management decisions. A negative result may occur with  improper specimen collection/handling, submission of specimen other than nasopharyngeal swab, presence of viral mutation(s) within the areas targeted by this assay, and inadequate number of viral copies(<138 copies/mL). A negative result must be combined with clinical observations, patient history, and epidemiological information. The expected result is Negative.  Fact Sheet for Patients:  BloggerCourse.com  Fact Sheet for Healthcare Providers:  SeriousBroker.it  This test is no t yet approved or cleared by the [place]  FDA and  has been authorized for detection and/or diagnosis of SARS-CoV-2 by FDA under an Emergency Use Authorization (EUA). This EUA will remain  in effect (meaning this test can be used) for the duration of the COVID-19 declaration under Section 564(b)(1) of the Act, 21 U.S.C.section 360bbb-3(b)(1), unless the authorization is terminated  or revoked sooner.       Influenza A by PCR NEGATIVE NEGATIVE Final   Influenza B by PCR NEGATIVE NEGATIVE Final    Comment: (NOTE) The Xpert Xpress SARS-CoV-2/FLU/RSV plus assay is intended as an aid in the diagnosis of influenza from Nasopharyngeal swab specimens and should not be used as a sole basis for treatment. Nasal washings  and aspirates are unacceptable for Xpert Xpress SARS-CoV-2/FLU/RSV testing.  Fact Sheet for Patients: BloggerCourse.com  Fact Sheet for Healthcare Providers: SeriousBroker.it  This test is not yet approved or cleared by the [place]  FDA and has been authorized for detection and/or diagnosis of SARS-CoV-2 by FDA under an Emergency Use Authorization (EUA). This EUA will remain in effect (meaning this test can be used) for the duration of the COVID-19 declaration under Section 564(b)(1) of the Act, 21 U.S.C. section 360bbb-3(b)(1), unless the authorization is terminated or revoked.     Resp Syncytial Virus by PCR NEGATIVE NEGATIVE Final    Comment: (NOTE) Fact Sheet for Patients: BloggerCourse.com  Fact Sheet for Healthcare Providers: SeriousBroker.it  This test is not yet approved or cleared by the [place]  FDA and has been authorized for detection and/or diagnosis of SARS-CoV-2 by FDA under an Emergency Use Authorization (EUA). This EUA will remain in effect (meaning this test can be used) for the duration of the COVID-19 declaration under Section 564(b)(1) of  the Act, 21 U.S.C. section 360bbb-3(b)(1), unless the authorization is terminated or revoked.  Performed at [facility], [address], [place], [place] [address]     Radiology Studies: CT ABDOMEN PELVIS W CONTRAST Result Date: 04/09/2024 CLINICAL DATA:  Abdomen pain fever unable to eat history of lung cancer EXAM: CT ABDOMEN AND PELVIS WITH CONTRAST TECHNIQUE: Multidetector CT imaging of the abdomen and pelvis was performed using the standard protocol following bolus administration of intravenous contrast. RADIATION DOSE REDUCTION: This exam was performed according to the departmental dose-optimization program which includes automated exposure control, adjustment of the mA and/or kV according  to patient size and/or use of iterative reconstruction technique. CONTRAST:  75mL OMNIPAQUE  IOHEXOL  300 MG/ML  SOLN COMPARISON:  CT 01/04/2024, 08/14/2023 FINDINGS: Lower chest: Lung bases demonstrate atelectasis or scarring at the bases. No acute airspace disease. Hepatobiliary: No calcified gallstone. No focal hepatic abnormality. Pancreas: No acute inflammatory changes. Prominent pancreatic duct as before. Spleen: Splenic granuloma. Spleen is enlarged, craniocaudal measurement of 14 cm. Adrenals/Urinary Tract: Adrenal glands are normal. Kidneys show no hydronephrosis. The bladder is normal Stomach/Bowel: Stomach nonenlarged. No dilated small bowel. No definite acute bowel wall thickening Vascular/Lymphatic: Nonaneurysmal aorta.  No suspicious lymph nodes Reproductive: Hysterectomy.  No adnexal mass. Other: Negative for ascites or free air. Musculoskeletal: Grossly stable known skeletal metastatic disease involving the vertebral bodies, pelvis and proximal right femur. IMPRESSION: 1. No CT evidence for acute intra-abdominal or pelvic abnormality. No evidence for bowel obstruction or acute bowel wall thickening 2. Mild splenomegaly. 3. Grossly stable known skeletal metastatic disease. Electronically Signed   By: Luke Bun M.D.   On: 04/09/2024 16:46   Scheduled Meds:  enoxaparin  (LOVENOX ) injection  30 mg Subcutaneous Q24H   Continuous Infusions:  dextrose  5% lactated ringers  125 mL/hr at 04/10/24 0544   lactated ringers  Stopped (04/10/24 0529)    LOS: 0 days   Time spent:  Elsie JAYSON Montclair, DO [facility]  If 7PM-7AM, please contact night-coverage [email or web address]  04/10/2024, 7:44 AM

## 2024-04-10 NOTE — Plan of Care (Signed)

## 2024-04-10 NOTE — Progress Notes (Signed)
   04/10/24 0850  TOC Brief Assessment  Insurance and Status Reviewed  Patient has primary care physician Yes  Home environment has been reviewed Apartment  Prior level of function: Independent  Prior/Current Home Services No current home services  Social Drivers of Health Review SDOH reviewed no interventions necessary  Readmission risk has been reviewed Yes  Transition of care needs transition of care needs identified, TOC will continue to follow    Signed: Heather Saltness, MSW, LCSW Clinical Social Worker Inpatient Care Management 04/10/2024 8:51 AM

## 2024-04-11 ENCOUNTER — Other Ambulatory Visit (HOSPITAL_COMMUNITY): Payer: Self-pay

## 2024-04-11 DIAGNOSIS — R112 Nausea with vomiting, unspecified: Secondary | ICD-10-CM | POA: Diagnosis not present

## 2024-04-11 MED ORDER — ONDANSETRON HCL 4 MG PO TABS: 4.0000 mg | ORAL_TABLET | Freq: Four times a day (QID) | ORAL | 0 refills | Status: DC | PRN

## 2024-04-11 MED ORDER — ONDANSETRON HCL 4 MG PO TABS
4.0000 mg | ORAL_TABLET | Freq: Four times a day (QID) | ORAL | 0 refills | Status: DC | PRN
  Filled 2024-04-11 – 2024-05-08 (×2): qty 120, 30d supply, fill #0

## 2024-04-11 NOTE — Discharge Summary (Signed)
 Physician Discharge Summary  Karen [name] FMW:[id number] DOB: February 10, 1966 DOA: 04/09/2024  PCP: Renne Homans, MD  Admit date: 04/09/2024 Discharge date: 04/11/2024  Admitted From: Home Disposition: Home  Recommendations for Outpatient Follow-up:  Follow up with PCP in 1-2 weeks Follow-up with oncology as scheduled  Home Health: None Equipment/Devices: None  Discharge Condition: Stable CODE STATUS: Full Diet recommendation: As tolerated  Brief/Interim Summary: Karen [name] is a 58 y.o. female with medical history significant of stage IV lung cancer, GERD, essential hypertension, irritable bowel syndrome, migraine headaches, latent tuberculosis, depression, who presents to the ER with spouse due to intractable nausea vomiting and diarrhea.  Patient admitted as above with intractable nausea vomiting and diarrhea likely secondary to current medical regimen, infectious workup remains negative, otherwise patient has resolved and is at this point back to baseline with no further episodes tolerating p.o. well and requesting discharge home which is certainly reasonable.  Follow-up outpatient with PCP and oncology as scheduled.     Discharge Diagnoses:  Principal Problem:   Intractable nausea and vomiting Active Problems:   Primary adenocarcinoma of lung (HCC)   Chronic pain syndrome   Anxiety   Hyperlipidemia   Nausea & vomiting  Intractable nausea with vomiting:  - Questionably secondary to current medication regimen versus malignancy. - Resolved with supportive care  Stage IV primary adenocarcinoma of the lung: Continue care per oncology. Chronic pain syndrome: Continue home hydrocodone   Anxiety disorder: Resume home clonazepam , Lamictal, buspirone Chronic migraine headaches: Symptomatic treatment with home sumatriptan  History of latent tuberculosis: Continue to monitor   Discharge Instructions  Discharge Instructions     Call MD for:  difficulty breathing, headache or  visual disturbances   Complete by: As directed    Call MD for:  extreme fatigue   Complete by: As directed    Call MD for:  hives   Complete by: As directed    Call MD for:  persistant dizziness or light-headedness   Complete by: As directed    Call MD for:  persistant nausea and vomiting   Complete by: As directed    Call MD for:  severe uncontrolled pain   Complete by: As directed    Call MD for:  temperature >100.4   Complete by: As directed    Diet - low sodium heart healthy   Complete by: As directed    Increase activity slowly   Complete by: As directed       Allergies as of 04/11/2024       Reactions   Percocet [oxycodone -acetaminophen ] Anaphylaxis, Itching   Hydromorphone  Hcl Itching   Lidocaine  Itching, Other (See Comments)   3/5 -  patient received 1 % Lidocaine  for thoracentesis on 3/3 and had no reaction. GORMAN Lukes RN,pt denies 07/07/23   Other Other (See Comments)   Bleach- breaks out the skin if used in the washing machine, pt denies 07/07/23        Medication List     STOP taking these medications    atorvastatin  40 MG tablet Commonly known as: LIPITOR   prochlorperazine  10 MG tablet Commonly known as: COMPAZINE        TAKE these medications    albuterol  108 (90 Base) MCG/ACT inhaler Commonly known as: VENTOLIN  HFA Inhale 2 puffs into the lungs See admin instructions. Inhale 2 puffs into the lungs every 2-4 hours as needed for shortness of breath or wheezing   busPIRone 5 MG tablet Commonly known as: BUSPAR Take 5 mg by mouth 3 (three) times  daily.   clonazePAM  1 MG tablet Commonly known as: KLONOPIN  Take 1 tablet (1 mg total) by mouth daily. What changed:  when to take this additional instructions   folic acid  1 MG tablet Commonly known as: FOLVITE  Take 1 tablet (1 mg total) by mouth daily.   HYDROcodone -acetaminophen  5-325 MG tablet Commonly known as: NORCO/VICODIN Tomar via oral de 1-2 tabletas cada 6 ( seis ) horas cuando sea  necesario por dolor ( nivel del dolor 4-6 ). (Take 1-2 tablets by mouth every 6 (six) hours as needed for moderate pain (pain score 4-6).) What changed:  how much to take when to take this additional instructions   Ibuprofen  200 MG Caps Take 600 mg by mouth every 6 (six) hours as needed (for headaches).   lamoTRIgine 25 MG tablet Commonly known as: LAMICTAL Take 50 mg by mouth daily.   Mekinist  2 MG tablet Generic drug: trametinib  dimethyl sulfoxide  Take 1 tablet (2 mg total) by mouth daily. Take 1 hour before or 2 hours after a meal. Store in original container. What changed:  when to take this additional instructions   ondansetron  4 MG tablet Commonly known as: ZOFRAN  Take 1 tablet (4 mg total) by mouth every 6 (six) hours as needed for nausea. What changed:  when to take this reasons to take this   polyethylene glycol powder 17 GM/SCOOP powder Commonly known as: GLYCOLAX /MIRALAX  Take 17 g by mouth 2 (two) times daily.   potassium chloride  SA 20 MEQ tablet Commonly known as: KLOR-CON  M Tome una tableta (20 mEq en total) por va oral diariamente. (Take 1 tablet (20 mEq total) by mouth daily.)   senna-docusate 8.6-50 MG tablet Commonly known as: Senokot-S Take 1 tablet by mouth 2 (two) times daily.   sertraline  100 MG tablet Commonly known as: ZOLOFT  Take 1 tablet (100 mg total) by mouth 2 (two) times daily. What changed: additional instructions   SUMAtriptan  50 MG tablet Commonly known as: IMITREX  Take 1 tablet by mouth once as needed for migraine. May repeat in 2 hours if headache persists or recurs.   Tafinlar  75 MG capsule Generic drug: dabrafenib mesylate  Take 2 capsules (150 mg total) by mouth 2 (two) times daily. Take on an empty stomach 1 hour before or 2 hours after meals.   traZODone  100 MG tablet Commonly known as: DESYREL  Take 100 mg by mouth at bedtime.        Allergies  Allergen Reactions   Percocet [Oxycodone -Acetaminophen ] Anaphylaxis  and Itching   Hydromorphone  Hcl Itching   Lidocaine  Itching and Other (See Comments)    3/5 -  patient received 1 % Lidocaine  for thoracentesis on 3/3 and had no reaction. GORMAN Lukes RN,pt denies 07/07/23    Other Other (See Comments)    Bleach- breaks out the skin if used in the washing machine, pt denies 07/07/23    Consultations: None  Procedures/Studies: CT ABDOMEN PELVIS W CONTRAST Result Date: 04/09/2024 CLINICAL DATA:  Abdomen pain fever unable to eat history of lung cancer EXAM: CT ABDOMEN AND PELVIS WITH CONTRAST TECHNIQUE: Multidetector CT imaging of the abdomen and pelvis was performed using the standard protocol following bolus administration of intravenous contrast. RADIATION DOSE REDUCTION: This exam was performed according to the departmental dose-optimization program which includes automated exposure control, adjustment of the mA and/or kV according to patient size and/or use of iterative reconstruction technique. CONTRAST:  75mL OMNIPAQUE  IOHEXOL  300 MG/ML  SOLN COMPARISON:  CT 01/04/2024, 08/14/2023 FINDINGS: Lower chest: Lung bases demonstrate atelectasis  or scarring at the bases. No acute airspace disease. Hepatobiliary: No calcified gallstone. No focal hepatic abnormality. Pancreas: No acute inflammatory changes. Prominent pancreatic duct as before. Spleen: Splenic granuloma. Spleen is enlarged, craniocaudal measurement of 14 cm. Adrenals/Urinary Tract: Adrenal glands are normal. Kidneys show no hydronephrosis. The bladder is normal Stomach/Bowel: Stomach nonenlarged. No dilated small bowel. No definite acute bowel wall thickening Vascular/Lymphatic: Nonaneurysmal aorta.  No suspicious lymph nodes Reproductive: Hysterectomy.  No adnexal mass. Other: Negative for ascites or free air. Musculoskeletal: Grossly stable known skeletal metastatic disease involving the vertebral bodies, pelvis and proximal right femur. IMPRESSION: 1. No CT evidence for acute intra-abdominal or pelvic  abnormality. No evidence for bowel obstruction or acute bowel wall thickening 2. Mild splenomegaly. 3. Grossly stable known skeletal metastatic disease. Electronically Signed   By: Luke Bun M.D.   On: 04/09/2024 16:46     Subjective: No acute issues or events overnight   Discharge Exam: Vitals:   04/11/24 0604 04/11/24 1345  BP: 111/71 114/71  Pulse: 71 65  Resp: 18 15  Temp: 97.9 F (36.6 C) 98 F (36.7 C)  SpO2: 100% 99%   Vitals:   04/10/24 1440 04/10/24 2005 04/11/24 0604 04/11/24 1345  BP: 112/70 118/77 111/71 114/71  Pulse: 73 65 71 65  Resp: 15 18 18 15   Temp: 97.6 F (36.4 C) 98.2 F (36.8 C) 97.9 F (36.6 C) 98 F (36.7 C)  TempSrc: Oral Oral Oral Oral  SpO2: 100% 99% 100% 99%  Weight:      Height:        General: Pt is alert, awake, not in acute distress Cardiovascular: RRR, S1/S2 +, no rubs, no gallops Respiratory: CTA bilaterally, no wheezing, no rhonchi Abdominal: Soft, NT, ND, bowel sounds + Extremities: no edema, no cyanosis    The results of significant diagnostics from this hospitalization (including imaging, microbiology, ancillary and laboratory) are listed below for reference.     Microbiology: Recent Results (from the past 240 hours)  Resp panel by RT-PCR (RSV, Flu A&B, Covid) Anterior Nasal Swab     Status: None   Collection Time: 04/09/24  2:29 PM   Specimen: Anterior Nasal Swab  Result Value Ref Range Status   SARS Coronavirus 2 by RT PCR NEGATIVE NEGATIVE Final    Comment: (NOTE) SARS-CoV-2 target nucleic acids are NOT DETECTED.  The SARS-CoV-2 RNA is generally detectable in upper respiratory specimens during the acute phase of infection. The lowest concentration of SARS-CoV-2 viral copies this assay can detect is 138 copies/mL. A negative result does not preclude SARS-Cov-2 infection and should not be used as the sole basis for treatment or other patient management decisions. A negative result may occur with  improper  specimen collection/handling, submission of specimen other than nasopharyngeal swab, presence of viral mutation(s) within the areas targeted by this assay, and inadequate number of viral copies(<138 copies/mL). A negative result must be combined with clinical observations, patient history, and epidemiological information. The expected result is Negative.  Fact Sheet for Patients:  BloggerCourse.com  Fact Sheet for Healthcare Providers:  SeriousBroker.it  This test is no t yet approved or cleared by the [place]  FDA and  has been authorized for detection and/or diagnosis of SARS-CoV-2 by FDA under an Emergency Use Authorization (EUA). This EUA will remain  in effect (meaning this test can be used) for the duration of the COVID-19 declaration under Section 564(b)(1) of the Act, 21 U.S.C.section 360bbb-3(b)(1), unless the authorization is terminated  or revoked sooner.  Influenza A by PCR NEGATIVE NEGATIVE Final   Influenza B by PCR NEGATIVE NEGATIVE Final    Comment: (NOTE) The Xpert Xpress SARS-CoV-2/FLU/RSV plus assay is intended as an aid in the diagnosis of influenza from Nasopharyngeal swab specimens and should not be used as a sole basis for treatment. Nasal washings and aspirates are unacceptable for Xpert Xpress SARS-CoV-2/FLU/RSV testing.  Fact Sheet for Patients: BloggerCourse.com  Fact Sheet for Healthcare Providers: SeriousBroker.it  This test is not yet approved or cleared by the [place]  FDA and has been authorized for detection and/or diagnosis of SARS-CoV-2 by FDA under an Emergency Use Authorization (EUA). This EUA will remain in effect (meaning this test can be used) for the duration of the COVID-19 declaration under Section 564(b)(1) of the Act, 21 U.S.C. section 360bbb-3(b)(1), unless the authorization is terminated or revoked.     Resp  Syncytial Virus by PCR NEGATIVE NEGATIVE Final    Comment: (NOTE) Fact Sheet for Patients: BloggerCourse.com  Fact Sheet for Healthcare Providers: SeriousBroker.it  This test is not yet approved or cleared by the [place]  FDA and has been authorized for detection and/or diagnosis of SARS-CoV-2 by FDA under an Emergency Use Authorization (EUA). This EUA will remain in effect (meaning this test can be used) for the duration of the COVID-19 declaration under Section 564(b)(1) of the Act, 21 U.S.C. section 360bbb-3(b)(1), unless the authorization is terminated or revoked.  Performed at [facility], [address] [place], [place] [address]      Labs: BNP (last 3 results) No results for input(s): BNP in the last 8760 hours. Basic Metabolic Panel: Recent Labs  Lab 04/09/24 1429 04/10/24 0526  NA 135 136  K 4.2 4.0  CL 103 102  CO2 23 24  GLUCOSE 98 81  BUN 15 8  CREATININE 0.82 0.82  CALCIUM  9.7 9.2   Liver Function Tests: Recent Labs  Lab 04/09/24 1429 04/10/24 0526  AST 33 31  ALT 12 12  ALKPHOS 94 94  BILITOT 0.3 0.3  PROT 6.8 6.7  ALBUMIN 3.5 3.4*   Recent Labs  Lab 04/09/24 1429  LIPASE 53*   No results for input(s): AMMONIA in the last 168 hours. CBC: Recent Labs  Lab 04/09/24 1429 04/10/24 0526  WBC 4.5 4.6  NEUTROABS 3.1  --   HGB 9.4* 9.8*  HCT 29.4* 30.6*  MCV 87.5 86.9  PLT 193 232   Cardiac Enzymes: No results for input(s): CKTOTAL, CKMB, CKMBINDEX, TROPONINI in the last 168 hours. BNP: Invalid input(s): POCBNP CBG: No results for input(s): GLUCAP in the last 168 hours. D-Dimer No results for input(s): DDIMER in the last 72 hours. Hgb A1c No results for input(s): HGBA1C in the last 72 hours. Lipid Profile No results for input(s): CHOL, HDL, LDLCALC, TRIG, CHOLHDL, LDLDIRECT in the last 72 hours. Thyroid  function studies No  results for input(s): TSH, T4TOTAL, T3FREE, THYROIDAB in the last 72 hours.  Invalid input(s): FREET3 Anemia work up No results for input(s): VITAMINB12, FOLATE, FERRITIN, TIBC, IRON, RETICCTPCT in the last 72 hours. Urinalysis    Component Value Date/Time   COLORURINE YELLOW 04/09/2024 1647   APPEARANCEUR CLEAR 04/09/2024 1647   APPEARANCEUR Clear 12/01/2021 1130   LABSPEC 1.032 (H) 04/09/2024 1647   PHURINE 6.0 04/09/2024 1647   GLUCOSEU NEGATIVE 04/09/2024 1647   GLUCOSEU NEG mg/dL 90/97/7989 7945   HGBUR NEGATIVE 04/09/2024 1647   HGBUR trace-intact 12/30/2008 1323   BILIRUBINUR NEGATIVE 04/09/2024 1647   BILIRUBINUR Negative 12/01/2021 1130  KETONESUR NEGATIVE 04/09/2024 1647   PROTEINUR NEGATIVE 04/09/2024 1647   UROBILINOGEN 1.0 03/24/2020 1651   UROBILINOGEN 1.0 04/29/2015 1616   NITRITE NEGATIVE 04/09/2024 1647   LEUKOCYTESUR NEGATIVE 04/09/2024 1647   Sepsis Labs Recent Labs  Lab 04/09/24 1429 04/10/24 0526  WBC 4.5 4.6   Microbiology Recent Results (from the past 240 hours)  Resp panel by RT-PCR (RSV, Flu A&B, Covid) Anterior Nasal Swab     Status: None   Collection Time: 04/09/24  2:29 PM   Specimen: Anterior Nasal Swab  Result Value Ref Range Status   SARS Coronavirus 2 by RT PCR NEGATIVE NEGATIVE Final    Comment: (NOTE) SARS-CoV-2 target nucleic acids are NOT DETECTED.  The SARS-CoV-2 RNA is generally detectable in upper respiratory specimens during the acute phase of infection. The lowest concentration of SARS-CoV-2 viral copies this assay can detect is 138 copies/mL. A negative result does not preclude SARS-Cov-2 infection and should not be used as the sole basis for treatment or other patient management decisions. A negative result may occur with  improper specimen collection/handling, submission of specimen other than nasopharyngeal swab, presence of viral mutation(s) within the areas targeted by this assay, and inadequate  number of viral copies(<138 copies/mL). A negative result must be combined with clinical observations, patient history, and epidemiological information. The expected result is Negative.  Fact Sheet for Patients:  BloggerCourse.com  Fact Sheet for Healthcare Providers:  SeriousBroker.it  This test is no t yet approved or cleared by the [place]  FDA and  has been authorized for detection and/or diagnosis of SARS-CoV-2 by FDA under an Emergency Use Authorization (EUA). This EUA will remain  in effect (meaning this test can be used) for the duration of the COVID-19 declaration under Section 564(b)(1) of the Act, 21 U.S.C.section 360bbb-3(b)(1), unless the authorization is terminated  or revoked sooner.       Influenza A by PCR NEGATIVE NEGATIVE Final   Influenza B by PCR NEGATIVE NEGATIVE Final    Comment: (NOTE) The Xpert Xpress SARS-CoV-2/FLU/RSV plus assay is intended as an aid in the diagnosis of influenza from Nasopharyngeal swab specimens and should not be used as a sole basis for treatment. Nasal washings and aspirates are unacceptable for Xpert Xpress SARS-CoV-2/FLU/RSV testing.  Fact Sheet for Patients: BloggerCourse.com  Fact Sheet for Healthcare Providers: SeriousBroker.it  This test is not yet approved or cleared by the [place]  FDA and has been authorized for detection and/or diagnosis of SARS-CoV-2 by FDA under an Emergency Use Authorization (EUA). This EUA will remain in effect (meaning this test can be used) for the duration of the COVID-19 declaration under Section 564(b)(1) of the Act, 21 U.S.C. section 360bbb-3(b)(1), unless the authorization is terminated or revoked.     Resp Syncytial Virus by PCR NEGATIVE NEGATIVE Final    Comment: (NOTE) Fact Sheet for Patients: BloggerCourse.com  Fact Sheet for Healthcare  Providers: SeriousBroker.it  This test is not yet approved or cleared by the [place]  FDA and has been authorized for detection and/or diagnosis of SARS-CoV-2 by FDA under an Emergency Use Authorization (EUA). This EUA will remain in effect (meaning this test can be used) for the duration of the COVID-19 declaration under Section 564(b)(1) of the Act, 21 U.S.C. section 360bbb-3(b)(1), unless the authorization is terminated or revoked.  Performed at [facility], [address], [place], [place] [address]      Time coordinating discharge: Over 30 minutes  SIGNED:   Elsie JAYSON Montclair, DO [facility] 04/11/2024, 4:07 PM Pager   If 7PM-7AM, please contact night-coverage [email or web address]

## 2024-04-12 ENCOUNTER — Other Ambulatory Visit (HOSPITAL_COMMUNITY): Payer: Self-pay

## 2024-04-12 ENCOUNTER — Other Ambulatory Visit: Payer: Self-pay

## 2024-04-12 NOTE — Progress Notes (Signed)
 Specialty Pharmacy Refill Coordination Note  Karen [name] is a 58 y.o. female contacted today regarding refills of specialty medication(s) Dabrafenib Mesylate  (TAFINLAR ); Trametinib  Dimethyl Sulfoxide  (MEKINIST )   Patient requested Pickup at [facility] [place] date: 04/13/24   Medication will be filled on 04/12/24.

## 2024-04-20 ENCOUNTER — Other Ambulatory Visit (HOSPITAL_COMMUNITY): Payer: Self-pay

## 2024-04-26 ENCOUNTER — Other Ambulatory Visit: Payer: Self-pay | Admitting: Student

## 2024-04-26 ENCOUNTER — Other Ambulatory Visit: Payer: Self-pay

## 2024-04-26 DIAGNOSIS — C3490 Malignant neoplasm of unspecified part of unspecified bronchus or lung: Secondary | ICD-10-CM

## 2024-04-26 NOTE — Telephone Encounter (Signed)
 LOV 11/17/23

## 2024-04-26 NOTE — Telephone Encounter (Unsigned)
 Copied from CRM [id number]. Topic: Clinical - Medication Refill >> Apr 26, 2024  3:58 PM Carrielelia G wrote: Medication: clonazePAM  (KLONOPIN ) 1 MG tablet  Has the patient contacted their pharmacy? No (Agent: If no, request that the patient contact the pharmacy for the refill. If patient does not wish to contact the pharmacy document the reason why and proceed with request.) (Agent: If yes, when and what did the pharmacy advise?)  This is the patient's preferred pharmacy:  [id number] [address], [place] - [address] [place] [address] Phone: [phone] Fax: [phone]  Is this the correct pharmacy for this prescription? Yes If no, delete pharmacy and type the correct one.     Is the patient out of the medication? No  Has the patient been seen for an appointment in the last year OR does the patient have an upcoming appointment? Yes  Can we respond through MyChart? Yes  Agent: Please be advised that Rx refills may take up to 3 business days. We ask that you follow-up with your pharmacy.

## 2024-04-30 ENCOUNTER — Inpatient Hospital Stay: Admitting: Nurse Practitioner

## 2024-04-30 ENCOUNTER — Other Ambulatory Visit: Payer: Self-pay | Admitting: Student

## 2024-04-30 ENCOUNTER — Inpatient Hospital Stay (HOSPITAL_BASED_OUTPATIENT_CLINIC_OR_DEPARTMENT_OTHER): Admitting: Hematology

## 2024-04-30 ENCOUNTER — Encounter: Payer: Self-pay | Admitting: Nurse Practitioner

## 2024-04-30 ENCOUNTER — Inpatient Hospital Stay: Attending: Hematology

## 2024-04-30 VITALS — BP 110/75 | HR 67 | Temp 97.8°F | Resp 18 | Ht 61.0 in | Wt 103.4 lb

## 2024-04-30 DIAGNOSIS — C419 Malignant neoplasm of bone and articular cartilage, unspecified: Secondary | ICD-10-CM | POA: Diagnosis not present

## 2024-04-30 DIAGNOSIS — Z79899 Other long term (current) drug therapy: Secondary | ICD-10-CM | POA: Insufficient documentation

## 2024-04-30 DIAGNOSIS — G44039 Episodic paroxysmal hemicrania, not intractable: Secondary | ICD-10-CM | POA: Diagnosis not present

## 2024-04-30 DIAGNOSIS — C3490 Malignant neoplasm of unspecified part of unspecified bronchus or lung: Secondary | ICD-10-CM

## 2024-04-30 DIAGNOSIS — C7949 Secondary malignant neoplasm of other parts of nervous system: Secondary | ICD-10-CM

## 2024-04-30 DIAGNOSIS — G43009 Migraine without aura, not intractable, without status migrainosus: Secondary | ICD-10-CM | POA: Diagnosis not present

## 2024-04-30 DIAGNOSIS — C7931 Secondary malignant neoplasm of brain: Secondary | ICD-10-CM | POA: Insufficient documentation

## 2024-04-30 DIAGNOSIS — Z515 Encounter for palliative care: Secondary | ICD-10-CM | POA: Diagnosis not present

## 2024-04-30 DIAGNOSIS — C7951 Secondary malignant neoplasm of bone: Secondary | ICD-10-CM | POA: Diagnosis present

## 2024-04-30 DIAGNOSIS — F418 Other specified anxiety disorders: Secondary | ICD-10-CM | POA: Insufficient documentation

## 2024-04-30 DIAGNOSIS — Z79891 Long term (current) use of opiate analgesic: Secondary | ICD-10-CM | POA: Diagnosis not present

## 2024-04-30 DIAGNOSIS — I10 Essential (primary) hypertension: Secondary | ICD-10-CM | POA: Insufficient documentation

## 2024-04-30 DIAGNOSIS — G893 Neoplasm related pain (acute) (chronic): Secondary | ICD-10-CM

## 2024-04-30 DIAGNOSIS — R1310 Dysphagia, unspecified: Secondary | ICD-10-CM | POA: Diagnosis not present

## 2024-04-30 DIAGNOSIS — R110 Nausea: Secondary | ICD-10-CM

## 2024-04-30 DIAGNOSIS — R530 Neoplastic (malignant) related fatigue: Secondary | ICD-10-CM

## 2024-04-30 LAB — CBC WITH DIFFERENTIAL (CANCER CENTER ONLY)
Abs Immature Granulocytes: 0.01 K/uL (ref 0.00–0.07)
Basophils Absolute: 0 K/uL (ref 0.0–0.1)
Basophils Relative: 1 %
Eosinophils Absolute: 0.2 K/uL (ref 0.0–0.5)
Eosinophils Relative: 4 %
HCT: 32.8 % — ABNORMAL LOW (ref 36.0–46.0)
Hemoglobin: 10.7 g/dL — ABNORMAL LOW (ref 12.0–15.0)
Immature Granulocytes: 0 %
Lymphocytes Relative: 30 %
Lymphs Abs: 1.5 K/uL (ref 0.7–4.0)
MCH: 28.6 pg (ref 26.0–34.0)
MCHC: 32.6 g/dL (ref 30.0–36.0)
MCV: 87.7 fL (ref 80.0–100.0)
Monocytes Absolute: 0.4 K/uL (ref 0.1–1.0)
Monocytes Relative: 8 %
Neutro Abs: 2.9 K/uL (ref 1.7–7.7)
Neutrophils Relative %: 57 %
Platelet Count: 232 K/uL (ref 150–400)
RBC: 3.74 MIL/uL — ABNORMAL LOW (ref 3.87–5.11)
RDW: 15.5 % (ref 11.5–15.5)
WBC Count: 5 K/uL (ref 4.0–10.5)
nRBC: 0 % (ref 0.0–0.2)

## 2024-04-30 LAB — CMP (CANCER CENTER ONLY)
ALT: 5 U/L (ref 0–44)
AST: 17 U/L (ref 15–41)
Albumin: 4 g/dL (ref 3.5–5.0)
Alkaline Phosphatase: 86 U/L (ref 38–126)
Anion gap: 6 (ref 5–15)
BUN: 24 mg/dL — ABNORMAL HIGH (ref 6–20)
CO2: 25 mmol/L (ref 22–32)
Calcium: 9.6 mg/dL (ref 8.9–10.3)
Chloride: 107 mmol/L (ref 98–111)
Creatinine: 0.89 mg/dL (ref 0.44–1.00)
GFR, Estimated: >60 mL/min (ref >60)
Glucose, Bld: 110 mg/dL — ABNORMAL HIGH (ref 70–99)
Potassium: 4.1 mmol/L (ref 3.5–5.1)
Sodium: 138 mmol/L (ref 135–145)
Total Bilirubin: 0.3 mg/dL (ref 0.0–1.2)
Total Protein: 7.2 g/dL (ref 6.5–8.1)

## 2024-04-30 LAB — MAGNESIUM: Magnesium: 1.7 mg/dL (ref 1.7–2.4)

## 2024-04-30 MED ORDER — PREDNISONE 10 MG PO TABS: ORAL_TABLET | ORAL | 0 refills | Status: AC
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
## 2024-05-01 ENCOUNTER — Ambulatory Visit: Payer: Self-pay

## 2024-05-01 ENCOUNTER — Telehealth: Payer: Self-pay | Admitting: Student

## 2024-05-01 MED ORDER — CLONAZEPAM 1 MG PO TABS: 1.0000 mg | ORAL_TABLET | Freq: Every day | ORAL | 0 refills | Status: DC

## 2024-05-01 NOTE — Telephone Encounter (Signed)
 FYI Only or Action Required?: Action required by provider: medication refill request.  Patient was last seen in primary care on 11/17/2023 by Tobie Gaines, DO.  Called Nurse Triage reporting Medication Refill.  Triage Disposition: Call PCP When Office is Open  Patient/caregiver understands and will follow disposition?: Yes      Copied from CRM #[id number]. Topic: Clinical - Red Word Triage >> May 01, 2024  2:46 PM Mercer PEDLAR wrote: Red Word that prompted transfer to Nurse Triage: Anxitey and panic attacks due to not having clonazePAM  (KLONOPIN ) 1 MG tablet.       Reason for Disposition  Caller requesting a CONTROLLED substance prescription refill (e.g., narcotics, ADHD medicines)  Answer Assessment - Initial Assessment Questions Patient is out of her Klonopin  and has been experiencing increased anxiety and would like a refukk sent to her pharmacy. Please advise.     1. DRUG NAME: What medicine do you need to have refilled?     clonazePAM  (KLONOPIN ) 1 MG tablet [[id number]] 2. REFILLS REMAINING: How many refills are remaining? Notes: The label on the medicine or pill bottle will show how many refills are remaining. If there are no refills remaining, then a renewal may be needed.     No 3. EXPIRATION DATE: What is the expiration date? Note: The label states when the prescription will expire, and thus can no longer be refilled.)     N/A 4. PRESCRIBER: Who prescribed it? Note: The prescribing doctor or group is responsible for refill approvals..     Dr. Amoako 6. SYMPTOMS: Do you have any symptoms?     Increased anxiety  Protocols used: Medication Refill and Renewal Call-A-AH

## 2024-05-01 NOTE — Telephone Encounter (Signed)
 Patient due for Klonopin  refill. Reviewed PDMP and is appropriate.

## 2024-05-03 ENCOUNTER — Other Ambulatory Visit: Payer: Self-pay

## 2024-05-04 ENCOUNTER — Encounter (HOSPITAL_COMMUNITY): Payer: Self-pay

## 2024-05-07 ENCOUNTER — Encounter: Payer: Self-pay | Admitting: Hematology

## 2024-05-07 ENCOUNTER — Other Ambulatory Visit: Payer: Self-pay

## 2024-05-08 ENCOUNTER — Other Ambulatory Visit (HOSPITAL_COMMUNITY): Payer: Self-pay

## 2024-05-09 ENCOUNTER — Other Ambulatory Visit: Payer: Self-pay

## 2024-05-09 ENCOUNTER — Other Ambulatory Visit: Payer: Self-pay | Admitting: Student

## 2024-05-09 ENCOUNTER — Other Ambulatory Visit: Payer: Self-pay | Admitting: Pharmacy Technician

## 2024-05-09 ENCOUNTER — Other Ambulatory Visit (HOSPITAL_COMMUNITY): Payer: Self-pay

## 2024-05-09 DIAGNOSIS — G893 Neoplasm related pain (acute) (chronic): Secondary | ICD-10-CM

## 2024-05-09 DIAGNOSIS — Z515 Encounter for palliative care: Secondary | ICD-10-CM

## 2024-05-09 DIAGNOSIS — C3490 Malignant neoplasm of unspecified part of unspecified bronchus or lung: Secondary | ICD-10-CM

## 2024-05-09 MED ORDER — HYDROCODONE-ACETAMINOPHEN 5-325 MG PO TABS
1.0000 | ORAL_TABLET | Freq: Four times a day (QID) | ORAL | 0 refills | Status: DC | PRN
  Filled 2024-05-09: qty 60, 8d supply, fill #0

## 2024-05-09 NOTE — Progress Notes (Signed)
 Specialty Pharmacy Refill Coordination Note  Karen [name] is a 58 y.o. female contacted today regarding refills of specialty medication(s) Dabrafenib Mesylate  (TAFINLAR ); Trametinib  Dimethyl Sulfoxide  (MEKINIST )  Spoke with Husband  Patient requested Pickup at [facility] [place] date: 05/10/24   Medication will be filled on: 05/09/24

## 2024-05-15 ENCOUNTER — Other Ambulatory Visit: Payer: Self-pay

## 2024-05-15 DIAGNOSIS — C3490 Malignant neoplasm of unspecified part of unspecified bronchus or lung: Secondary | ICD-10-CM

## 2024-05-16 ENCOUNTER — Inpatient Hospital Stay

## 2024-05-16 ENCOUNTER — Inpatient Hospital Stay: Admitting: Hematology

## 2024-05-16 DIAGNOSIS — C3490 Malignant neoplasm of unspecified part of unspecified bronchus or lung: Secondary | ICD-10-CM | POA: Diagnosis not present

## 2024-05-16 LAB — CMP (CANCER CENTER ONLY)
ALT: 10 U/L (ref 0–44)
AST: 20 U/L (ref 15–41)
Albumin: 3.9 g/dL (ref 3.5–5.0)
Alkaline Phosphatase: 106 U/L (ref 38–126)
Anion gap: 9 (ref 5–15)
BUN: 22 mg/dL — ABNORMAL HIGH (ref 6–20)
CO2: 25 mmol/L (ref 22–32)
Calcium: 9.4 mg/dL (ref 8.9–10.3)
Chloride: 105 mmol/L (ref 98–111)
Creatinine: 0.73 mg/dL (ref 0.44–1.00)
GFR, Estimated: >60 mL/min (ref >60)
Glucose, Bld: 113 mg/dL — ABNORMAL HIGH (ref 70–99)
Potassium: 3.6 mmol/L (ref 3.5–5.1)
Sodium: 139 mmol/L (ref 135–145)
Total Bilirubin: <0.2 mg/dL (ref 0.0–1.2)
Total Protein: 7 g/dL (ref 6.5–8.1)

## 2024-05-16 LAB — CBC WITH DIFFERENTIAL (CANCER CENTER ONLY)
Abs Immature Granulocytes: 0.02 K/uL (ref 0.00–0.07)
Basophils Absolute: 0 K/uL (ref 0.0–0.1)
Basophils Relative: 1 %
Eosinophils Absolute: 0.2 K/uL (ref 0.0–0.5)
Eosinophils Relative: 3 %
HCT: 30.9 % — ABNORMAL LOW (ref 36.0–46.0)
Hemoglobin: 10.1 g/dL — ABNORMAL LOW (ref 12.0–15.0)
Immature Granulocytes: 0 %
Lymphocytes Relative: 35 %
Lymphs Abs: 2 K/uL (ref 0.7–4.0)
MCH: 28.1 pg (ref 26.0–34.0)
MCHC: 32.7 g/dL (ref 30.0–36.0)
MCV: 85.8 fL (ref 80.0–100.0)
Monocytes Absolute: 0.5 K/uL (ref 0.1–1.0)
Monocytes Relative: 8 %
Neutro Abs: 2.9 K/uL (ref 1.7–7.7)
Neutrophils Relative %: 53 %
Platelet Count: 300 K/uL (ref 150–400)
RBC: 3.6 MIL/uL — ABNORMAL LOW (ref 3.87–5.11)
RDW: 14.9 % (ref 11.5–15.5)
WBC Count: 5.6 K/uL (ref 4.0–10.5)
nRBC: 0 % (ref 0.0–0.2)

## 2024-05-16 LAB — LACTATE DEHYDROGENASE: LDH: 174 U/L (ref 105–235)

## 2024-05-18 ENCOUNTER — Ambulatory Visit (HOSPITAL_COMMUNITY)
Admission: RE | Admit: 2024-05-18 | Discharge: 2024-05-18 | Disposition: A | Source: Ambulatory Visit | Attending: Hematology

## 2024-05-18 ENCOUNTER — Ambulatory Visit (HOSPITAL_COMMUNITY)
Admission: RE | Admit: 2024-05-18 | Discharge: 2024-05-18 | Disposition: A | Discharge status: Home / Self Care | Source: Ambulatory Visit | Attending: Hematology | Admitting: Hematology

## 2024-05-18 DIAGNOSIS — C7931 Secondary malignant neoplasm of brain: Secondary | ICD-10-CM

## 2024-05-18 DIAGNOSIS — M898X9 Other specified disorders of bone, unspecified site: Secondary | ICD-10-CM

## 2024-05-18 DIAGNOSIS — G44039 Episodic paroxysmal hemicrania, not intractable: Secondary | ICD-10-CM | POA: Diagnosis present

## 2024-05-18 MED ORDER — GADOBUTROL 1 MMOL/ML IV SOLN
5.0000 mL | Freq: Once | INTRAVENOUS | Status: AC | PRN
  Administered 2024-05-18: 5 mL via INTRAVENOUS

## 2024-05-22 ENCOUNTER — Inpatient Hospital Stay: Admitting: Hematology

## 2024-05-23 ENCOUNTER — Inpatient Hospital Stay: Admitting: Hematology

## 2024-05-23 ENCOUNTER — Other Ambulatory Visit: Payer: Self-pay | Admitting: Hematology

## 2024-05-23 DIAGNOSIS — C3490 Malignant neoplasm of unspecified part of unspecified bronchus or lung: Secondary | ICD-10-CM

## 2024-05-24 ENCOUNTER — Other Ambulatory Visit (HOSPITAL_COMMUNITY): Payer: Self-pay

## 2024-05-24 ENCOUNTER — Other Ambulatory Visit: Payer: Self-pay

## 2024-05-24 MED ORDER — TRAMETINIB DIMETHYL SULFOXIDE 2 MG PO TABS
2.0000 mg | ORAL_TABLET | Freq: Every day | ORAL | 2 refills | Status: AC
  Filled 2024-05-24 – 2024-06-05 (×2): qty 30, 30d supply, fill #0

## 2024-05-25 NOTE — Telephone Encounter (Signed)
 Language services called and left voicemail for surgery.

## 2024-05-29 ENCOUNTER — Other Ambulatory Visit: Payer: Self-pay

## 2024-05-29 ENCOUNTER — Other Ambulatory Visit: Payer: Self-pay | Admitting: Student

## 2024-05-29 ENCOUNTER — Other Ambulatory Visit (HOSPITAL_COMMUNITY): Payer: Self-pay

## 2024-06-04 ENCOUNTER — Other Ambulatory Visit: Payer: Self-pay

## 2024-06-04 ENCOUNTER — Inpatient Hospital Stay: Attending: Hematology | Admitting: Nurse Practitioner

## 2024-06-04 ENCOUNTER — Telehealth: Payer: Self-pay | Admitting: Nurse Practitioner

## 2024-06-04 ENCOUNTER — Other Ambulatory Visit (HOSPITAL_COMMUNITY): Payer: Self-pay

## 2024-06-04 DIAGNOSIS — R530 Neoplastic (malignant) related fatigue: Secondary | ICD-10-CM

## 2024-06-04 DIAGNOSIS — R42 Dizziness and giddiness: Secondary | ICD-10-CM | POA: Diagnosis not present

## 2024-06-04 DIAGNOSIS — Z515 Encounter for palliative care: Secondary | ICD-10-CM

## 2024-06-04 DIAGNOSIS — R112 Nausea with vomiting, unspecified: Secondary | ICD-10-CM

## 2024-06-04 DIAGNOSIS — C3490 Malignant neoplasm of unspecified part of unspecified bronchus or lung: Secondary | ICD-10-CM | POA: Diagnosis not present

## 2024-06-04 DIAGNOSIS — R630 Anorexia: Secondary | ICD-10-CM

## 2024-06-04 DIAGNOSIS — G43909 Migraine, unspecified, not intractable, without status migrainosus: Secondary | ICD-10-CM | POA: Diagnosis not present

## 2024-06-04 DIAGNOSIS — R110 Nausea: Secondary | ICD-10-CM

## 2024-06-04 MED ORDER — ONDANSETRON HCL 8 MG PO TABS
8.0000 mg | ORAL_TABLET | Freq: Three times a day (TID) | ORAL | 3 refills | Status: AC | PRN
  Filled 2024-06-04 – 2024-07-24 (×2): qty 90, 30d supply, fill #0

## 2024-06-05 ENCOUNTER — Inpatient Hospital Stay: Attending: Hematology | Admitting: Hematology

## 2024-06-05 ENCOUNTER — Encounter: Payer: Self-pay | Admitting: Hematology

## 2024-06-05 ENCOUNTER — Encounter: Payer: Self-pay | Admitting: Nurse Practitioner

## 2024-06-05 ENCOUNTER — Other Ambulatory Visit: Payer: Self-pay

## 2024-06-05 ENCOUNTER — Other Ambulatory Visit: Payer: Self-pay | Admitting: Hematology

## 2024-06-05 DIAGNOSIS — C3490 Malignant neoplasm of unspecified part of unspecified bronchus or lung: Secondary | ICD-10-CM

## 2024-06-05 MED ORDER — DABRAFENIB MESYLATE 75 MG PO CAPS
150.0000 mg | ORAL_CAPSULE | Freq: Two times a day (BID) | ORAL | 2 refills | Status: AC
  Filled 2024-06-05 (×2): qty 120, 30d supply, fill #0
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
## 2024-06-06 ENCOUNTER — Inpatient Hospital Stay

## 2024-06-07 ENCOUNTER — Other Ambulatory Visit: Payer: Self-pay

## 2024-06-07 NOTE — Progress Notes (Signed)
 Specialty Pharmacy Refill Coordination Note  Keisi Eckford is a 59 y.o. female contacted today regarding refills of specialty medication(s) Dabrafenib Mesylate  (TAFINLAR ); Trametinib  Dimethyl Sulfoxide  (MEKINIST )   Patient requested Pickup at [facility] [place] date: 06/15/24   Medication will be filled on: 06/13/24   Spoke with patient's husband

## 2024-06-12 ENCOUNTER — Encounter: Payer: Self-pay | Admitting: Hematology

## 2024-06-13 ENCOUNTER — Other Ambulatory Visit: Payer: Self-pay

## 2024-06-13 ENCOUNTER — Other Ambulatory Visit (HOSPITAL_COMMUNITY): Payer: Self-pay

## 2024-06-15 ENCOUNTER — Other Ambulatory Visit: Payer: Self-pay

## 2024-06-22 ENCOUNTER — Telehealth: Payer: Self-pay | Admitting: Student

## 2024-06-22 ENCOUNTER — Other Ambulatory Visit: Payer: Self-pay | Admitting: Student

## 2024-06-22 NOTE — Telephone Encounter (Signed)
 Copied from CRM [id number]. Topic: Clinical - Medication Refill >> Jun 22, 2024  2:16 PM Diannia H wrote: Medication: clonazePAM  (KLONOPIN ) 1 MG tablet  Has the patient contacted their pharmacy? Yes (Agent: If no, request that the patient contact the pharmacy for the refill. If patient does not wish to contact the pharmacy document the reason why and proceed with request.) (Agent: If yes, when and what did the pharmacy advise?)  This is the patient's preferred pharmacy:  [id number] [place], [place] - [address] [place] [address] Phone: [phone] Fax: [phone]  Is this the correct pharmacy for this prescription? Yes If no, delete pharmacy and type the correct one.   Has the prescription been filled recently? Yes  Is the patient out of the medication? Yes  Has the patient been seen for an appointment in the last year OR does the patient have an upcoming appointment? Yes  Can we respond through MyChart? Yes  Agent: Please be advised that Rx refills may take up to 3 business days. We ask that you follow-up with your pharmacy.

## 2024-06-25 MED ORDER — CLONAZEPAM 1 MG PO TABS: 1.0000 mg | ORAL_TABLET | Freq: Every day | ORAL | 2 refills | Status: AC

## 2024-06-26 ENCOUNTER — Other Ambulatory Visit (HOSPITAL_COMMUNITY): Payer: Self-pay

## 2024-06-26 ENCOUNTER — Other Ambulatory Visit: Payer: Self-pay

## 2024-06-27 ENCOUNTER — Encounter: Payer: Self-pay | Admitting: Hematology

## 2024-06-27 ENCOUNTER — Other Ambulatory Visit: Payer: Self-pay

## 2024-06-28 ENCOUNTER — Other Ambulatory Visit: Payer: Self-pay | Admitting: Otolaryngology

## 2024-07-02 ENCOUNTER — Ambulatory Visit: Payer: Self-pay

## 2024-07-02 NOTE — Telephone Encounter (Signed)
 RTC to patient using [employer] [phone].  Spoke to patient has been out of her Klonopin  since Friday.  Husband was unable to pick up stating doctor was not covered.  Call to Pharmacy spoke to Pharmacist reran prescription.  Prescription approved. Patient is able to pick up after 3 PM today.  Patient was informed of through the interpreter.

## 2024-07-02 NOTE — Telephone Encounter (Signed)
 FYI Only or Action Required?: Action required by provider: update on patient condition.  Patient was last seen in primary care on 11/17/2023 by Tobie Gaines, DO.  Called Nurse Triage reporting Panic Attack.  Symptoms began several days ago.  Interventions attempted: Nothing.  Symptoms are: rapidly worsening.  Triage Disposition: Go to ED Now (or PCP Triage)  Patient/caregiver understands and will follow disposition?: No, refuses disposition   Copied from CRM [id number]. Topic: Clinical - Red Word Triage >> Jul 02, 2024  9:09 AM Marda MATSU wrote: Red Word that prompted transfer to Nurse Triage: Panic attacks,  Heart racing, sweating  ( this has been happening since Friday. Reason for Disposition  Patient sounds very sick or weak to the triager  [1] SEVERE anxiety (e.g., extremely anxious with intense emotional symptoms such as feeling of unreality, urge to flee, unable to calm down; unable to cope or function) AND [2] not better after 10 minutes of reassurance and Care Advice  Answer Assessment - Initial Assessment Questions 1. CONCERN: Did anything happen that prompted you to call today?      Spouse reporting she is not eating or sleeping since Friday, she has been out of Clonazepam  over the weekend, reports the pharmacy told them the physician needed to verify the prescription.  Per patient: 10/10 left sided chest pain, stomach, HA, nervous, not sleeping  2. ANXIETY SYMPTOMS: Can you describe how you (your loved one; patient) have been feeling? (e.g., tense, restless, panicky, anxious, keyed up, overwhelmed, sense of impending doom).      Up all night, watching TV, crying, wants to talk  3. ONSET: How long have you been feeling this way? (e.g., hours, days, weeks)     Since Friday  4. SEVERITY: How would you rate the level of anxiety? (e.g., 0 - 10; or mild, moderate, severe).     Severe per spouse.   5. FUNCTIONAL IMPAIRMENT: How have these feelings affected your  ability to do daily activities? Have you had more difficulty than usual doing your normal daily activities? (e.g., getting better, same, worse; self-care, school, work, interactions)     Spouse reports he had to take off work to be with her and care for her today, as she is not eating or sleeping   7. RISK OF HARM - SUICIDAL IDEATION: Do you ever have thoughts of hurting or killing yourself? If Yes, ask:  Do you have these feelings now? Do you have a plan on how you would do this?     Denies   10. POTENTIAL TRIGGERS: Do you drink caffeinated beverages (e.g., coffee, colas, teas), and how much daily? Do you drink alcohol or use any drugs? Have you started any new medicines recently?       Out of clonazepam   11. PATIENT SUPPORT: Who is with you now? Who do you live with? Do you have family or friends who you can talk to?        Spouse  12. OTHER SYMPTOMS: Do you have any other symptoms? (e.g., feeling depressed, trouble concentrating, trouble sleeping, trouble breathing, palpitations or fast heartbeat, chest pain, sweating, nausea, or diarrhea)  CP, sweating, fast heartbeat  Protocols used: Anxiety and Panic Attack-A-AH

## 2024-07-07 ENCOUNTER — Other Ambulatory Visit (HOSPITAL_COMMUNITY): Payer: Self-pay

## 2024-07-09 ENCOUNTER — Other Ambulatory Visit (HOSPITAL_COMMUNITY): Payer: Self-pay

## 2024-07-10 ENCOUNTER — Other Ambulatory Visit: Payer: Self-pay

## 2024-07-13 NOTE — Progress Notes (Signed)
 Good afternoon, Ms. Castilliomartinez    It was a pleasure speaking with you. As per our phone conversation, I have included the information for your surgery.   Date: 07/27/2024 Location: [facility] [address] [place], [place] [address] Phone Number: [phone] Time to arrive:  7:00 AM         (If there is a cancellation the facility will give you a new time to arrive) Surgery Start Time: 9:00 AM Instructions: Someone from the [facility] pre-op team will be in contact with you to get a medical history and work-up prior to the surgery date. Nothing to eat or drink after midnight unless otherwise authorized by the pre-op anesthesia team.   Your post-op appointment is on 08/15/24 at 11:30 AM with Dr. LLEWELLYN at [address] [place], [place] [address].   Please call Dr. LLEWELLYN assistant SINCLAIR or Demia at [phone] if: You have any pharmacy issues, or your pharmacy has changed since the last office visit You need FMLA paperwork filled out or a doctors note You have any medical questions pertaining to your surgery You have any post-surgical concerns or complications You need to reschedule a post-op appointment     Best Regards,   Avelina Mad Surgical Coordinator   [facility] [place] [address] [place], [place]  [address] Phone [phone]/ Fax [phone]   [facility] is now [facility]

## 2024-07-18 ENCOUNTER — Inpatient Hospital Stay: Attending: Hematology | Admitting: Nurse Practitioner

## 2024-07-18 ENCOUNTER — Encounter: Payer: Self-pay | Admitting: Nurse Practitioner

## 2024-07-18 DIAGNOSIS — C3490 Malignant neoplasm of unspecified part of unspecified bronchus or lung: Secondary | ICD-10-CM

## 2024-07-18 DIAGNOSIS — R630 Anorexia: Secondary | ICD-10-CM | POA: Diagnosis not present

## 2024-07-18 DIAGNOSIS — R530 Neoplastic (malignant) related fatigue: Secondary | ICD-10-CM | POA: Diagnosis not present

## 2024-07-18 DIAGNOSIS — C7949 Secondary malignant neoplasm of other parts of nervous system: Secondary | ICD-10-CM

## 2024-07-18 DIAGNOSIS — Z515 Encounter for palliative care: Secondary | ICD-10-CM | POA: Diagnosis not present

## 2024-07-18 DIAGNOSIS — K5900 Constipation, unspecified: Secondary | ICD-10-CM

## 2024-07-18 DIAGNOSIS — C419 Malignant neoplasm of bone and articular cartilage, unspecified: Secondary | ICD-10-CM
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
## 2024-07-24 ENCOUNTER — Other Ambulatory Visit: Payer: Self-pay | Admitting: Student

## 2024-07-24 ENCOUNTER — Other Ambulatory Visit: Payer: Self-pay

## 2024-07-24 ENCOUNTER — Other Ambulatory Visit (HOSPITAL_COMMUNITY): Payer: Self-pay

## 2024-07-24 ENCOUNTER — Other Ambulatory Visit: Payer: Self-pay | Admitting: Nurse Practitioner

## 2024-07-24 ENCOUNTER — Encounter: Payer: Self-pay | Admitting: Hematology

## 2024-07-24 DIAGNOSIS — Z515 Encounter for palliative care: Secondary | ICD-10-CM

## 2024-07-24 DIAGNOSIS — C3490 Malignant neoplasm of unspecified part of unspecified bronchus or lung: Secondary | ICD-10-CM

## 2024-07-24 DIAGNOSIS — E876 Hypokalemia: Secondary | ICD-10-CM

## 2024-07-24 DIAGNOSIS — G893 Neoplasm related pain (acute) (chronic): Secondary | ICD-10-CM

## 2024-07-24 MED ORDER — POTASSIUM CHLORIDE CRYS ER 20 MEQ PO TBCR
20.0000 meq | EXTENDED_RELEASE_TABLET | Freq: Every day | ORAL | 2 refills | Status: AC
  Filled 2024-07-24: qty 30, 30d supply, fill #0

## 2024-07-24 MED FILL — Hydrocodone-Acetaminophen Tab 5-325 MG: ORAL | 8 days supply | Qty: 60 | Fill #0 | Status: AC

## 2024-07-25 ENCOUNTER — Other Ambulatory Visit (HOSPITAL_COMMUNITY): Payer: Self-pay

## 2024-07-25 ENCOUNTER — Encounter (HOSPITAL_COMMUNITY): Payer: Self-pay | Admitting: Otolaryngology

## 2024-07-25 ENCOUNTER — Other Ambulatory Visit: Payer: Self-pay

## 2024-07-25 NOTE — Pre-Procedure Instructions (Signed)
-------------    SDW INSTRUCTIONS given:  Your procedure is scheduled on 2/6.  Report to [facility] Main Entrance A at 06:30 A.M., and check in at the Admitting office.  Any questions or running late day of surgery: call [phone]    Remember:  Do not eat after midnight the night before your surgery  You may drink clear liquids until 06:00 AM the morning of your surgery.   Clear liquids allowed are: Water , Non-Citrus Juices (without pulp), Carbonated Beverages, Clear Tea, Black Coffee Only, and Gatorade    Take these medicines the morning of surgery with A SIP OF WATER  clonazePAM  (KLONOPIN )  sertraline  (ZOLOFT )    May take these medicines IF NEEDED: albuterol  (VENTOLIN  HFA)- bring inhaler with you HYDROcodone -acetaminophen  (NORCO/VICODIN)  ondansetron  (ZOFRAN )  SUMAtriptan  (IMITREX )     As of today, STOP taking any Aspirin  (unless otherwise instructed by your surgeon) Aleve , Naproxen , Ibuprofen , Motrin , Advil , Goody's, BC's, all herbal medications, fish oil, and all vitamins.   Do NOT Smoke (Tobacco/Vaping) 24 hours prior to your procedure  If you use a CPAP at night, you may bring all equipment for your overnight stay.     You will be asked to remove any contacts, glasses, piercing's, hearing aid's, dentures/partials prior to surgery. Please bring cases for these items if needed.     Patients discharged the day of surgery will not be allowed to drive home, and someone needs to stay with them for 24 hours.  SURGICAL WAITING ROOM VISITATION Patients may have no more than 2 support people in the waiting area - these visitors may rotate.   Pre-op nurse will coordinate an appropriate time for 2 ADULT support persons, who may not rotate, to accompany patient in pre-op.  Children under the age of 77 must have an adult with them who is not the patient and must remain in the main waiting area with an adult.  If the patient needs to stay at the hospital during part of their  recovery, the visitor guidelines for inpatient rooms apply.  Please refer to the [facility] website for the visitor guidelines for any additional information.   Special instructions:   [place]- Preparing For Surgery   Please follow these instructions carefully.   Shower the NIGHT BEFORE SURGERY and the MORNING OF SURGERY with DIAL Soap.   Pat yourself dry with a CLEAN TOWEL.  Wear CLEAN PAJAMAS to bed the night before surgery  Place CLEAN SHEETS on your bed the night of your first shower and DO NOT SLEEP WITH PETS.   Additional instructions for the day of surgery: DO NOT APPLY any lotions, deodorants, cologne, or perfumes.   Do not wear jewelry or makeup Do not wear nail polish, gel polish, artificial nails, or any other type of covering on natural nails (fingers and toes) Do not bring valuables to the hospital. [facility] is not responsible for valuables/personal belongings. Put on clean/comfortable clothes.  Please brush your teeth.  Ask your nurse before applying any prescription medications to the skin.

## 2024-07-25 NOTE — Progress Notes (Signed)
 PCP - Dr. Drue Grow Cardiologist - denies  PPM/ICD - denies  Chest x-ray - 10/30/22 EKG - 04/09/24 Stress Test - 11/2009 ECHO - 09/10/22 Cardiac Cath - denies  CPAP - denies  DM- denies  ASA/Blood Thinner Instructions: n/a   ERAS Protcol - clears until 0600  COVID TEST- n/a  Anesthesia review: no  Patient verbally denies any shortness of breath, fever, cough and chest pain during phone call       Questions were answered. Patient verbalized understanding of instructions.

## 2024-07-27 ENCOUNTER — Other Ambulatory Visit (HOSPITAL_COMMUNITY): Payer: Self-pay

## 2024-07-27 ENCOUNTER — Ambulatory Visit (HOSPITAL_COMMUNITY)

## 2024-07-27 ENCOUNTER — Encounter: Payer: Self-pay | Admitting: Hematology

## 2024-07-27 ENCOUNTER — Encounter (HOSPITAL_COMMUNITY): Payer: Self-pay | Admitting: Otolaryngology

## 2024-07-27 ENCOUNTER — Encounter (HOSPITAL_COMMUNITY): Admission: RE | Disposition: A | Payer: Self-pay | Source: Home / Self Care | Attending: Otolaryngology

## 2024-07-27 ENCOUNTER — Ambulatory Visit (HOSPITAL_COMMUNITY)
Admission: RE | Admit: 2024-07-27 | Discharge: 2024-07-27 | Disposition: A | Source: Home / Self Care | Attending: Otolaryngology | Admitting: Otolaryngology

## 2024-07-27 ENCOUNTER — Other Ambulatory Visit: Payer: Self-pay

## 2024-07-27 DIAGNOSIS — J3801 Paralysis of vocal cords and larynx, unilateral: Secondary | ICD-10-CM | POA: Insufficient documentation

## 2024-07-27 HISTORY — DX: Dyspnea, unspecified: R06.00

## 2024-07-27 HISTORY — DX: Cerebral infarction, unspecified: I63.9

## 2024-07-27 LAB — BASIC METABOLIC PANEL WITH GFR
Anion gap: 10 (ref 5–15)
BUN: 20 mg/dL (ref 6–20)
CO2: 25 mmol/L (ref 22–32)
Calcium: 9.1 mg/dL (ref 8.9–10.3)
Chloride: 102 mmol/L (ref 98–111)
Creatinine, Ser: 0.95 mg/dL (ref 0.44–1.00)
GFR, Estimated: >60 mL/min
Glucose, Bld: 97 mg/dL (ref 70–99)
Potassium: 3.7 mmol/L (ref 3.5–5.1)
Sodium: 137 mmol/L (ref 135–145)

## 2024-07-27 LAB — CBC
HCT: 33.5 % — ABNORMAL LOW (ref 36.0–46.0)
Hemoglobin: 10.9 g/dL — ABNORMAL LOW (ref 12.0–15.0)
MCH: 27.3 pg (ref 26.0–34.0)
MCHC: 32.5 g/dL (ref 30.0–36.0)
MCV: 84 fL (ref 80.0–100.0)
Platelets: 225 10*3/uL (ref 150–400)
RBC: 3.99 MIL/uL (ref 3.87–5.11)
RDW: 14.6 % (ref 11.5–15.5)
WBC: 5.4 10*3/uL (ref 4.0–10.5)
nRBC: 0 % (ref 0.0–0.2)

## 2024-07-27 MED ORDER — ROCURONIUM BROMIDE 10 MG/ML (PF) SYRINGE
PREFILLED_SYRINGE | INTRAVENOUS | Status: DC | PRN
  Administered 2024-07-27: 30 mg via INTRAVENOUS

## 2024-07-27 MED ORDER — DEXAMETHASONE SOD PHOSPHATE PF 10 MG/ML IJ SOLN
INTRAMUSCULAR | Status: DC | PRN
  Administered 2024-07-27: 10 mg via INTRAVENOUS

## 2024-07-27 MED ORDER — AMISULPRIDE (ANTIEMETIC) 5 MG/2ML IV SOLN
10.0000 mg | Freq: Once | INTRAVENOUS | Status: AC
  Administered 2024-07-27: 10 mg via INTRAVENOUS

## 2024-07-27 MED ORDER — FENTANYL CITRATE (PF) 100 MCG/2ML IJ SOLN
25.0000 ug | INTRAMUSCULAR | Status: DC | PRN
  Administered 2024-07-27 (×2): 50 ug via INTRAVENOUS

## 2024-07-27 MED ORDER — PROPOFOL 10 MG/ML IV BOLUS
INTRAVENOUS | Status: AC
  Filled 2024-07-27: qty 20

## 2024-07-27 MED ORDER — EPINEPHRINE HCL (NASAL) 0.1 % NA SOLN
NASAL | Status: AC
  Filled 2024-07-27: qty 30

## 2024-07-27 MED ORDER — LACTATED RINGERS IV SOLN: INTRAVENOUS | Status: DC

## 2024-07-27 MED ORDER — FENTANYL CITRATE (PF) 100 MCG/2ML IJ SOLN
INTRAMUSCULAR | Status: DC | PRN
  Administered 2024-07-27 (×2): 25 ug via INTRAVENOUS
  Administered 2024-07-27: 50 ug via INTRAVENOUS

## 2024-07-27 MED ORDER — SUGAMMADEX SODIUM 200 MG/2ML IV SOLN
INTRAVENOUS | Status: DC | PRN
  Administered 2024-07-27: 181.6 mg via INTRAVENOUS

## 2024-07-27 MED ORDER — AMISULPRIDE (ANTIEMETIC) 5 MG/2ML IV SOLN
INTRAVENOUS | Status: AC
  Filled 2024-07-27: qty 4

## 2024-07-27 MED ORDER — PHENYLEPHRINE 80 MCG/ML (10ML) SYRINGE FOR IV PUSH (FOR BLOOD PRESSURE SUPPORT)
PREFILLED_SYRINGE | INTRAVENOUS | Status: DC | PRN
  Administered 2024-07-27: 160 ug via INTRAVENOUS

## 2024-07-27 MED ORDER — ORAL CARE MOUTH RINSE: 15.0000 mL | Freq: Once | OROMUCOSAL | Status: AC

## 2024-07-27 MED ORDER — CHLORHEXIDINE GLUCONATE 0.12 % MT SOLN
15.0000 mL | Freq: Once | OROMUCOSAL | Status: AC
  Administered 2024-07-27: 15 mL via OROMUCOSAL

## 2024-07-27 MED ORDER — CHLORHEXIDINE GLUCONATE 0.12 % MT SOLN
OROMUCOSAL | Status: AC
  Filled 2024-07-27: qty 15

## 2024-07-27 MED ORDER — ONDANSETRON HCL 4 MG/2ML IJ SOLN
INTRAMUSCULAR | Status: DC | PRN
  Administered 2024-07-27: 4 mg via INTRAVENOUS

## 2024-07-27 MED ORDER — MIDAZOLAM HCL 2 MG/2ML IJ SOLN
INTRAMUSCULAR | Status: AC
  Filled 2024-07-27: qty 2

## 2024-07-27 MED ORDER — LIDOCAINE 2% (20 MG/ML) 5 ML SYRINGE
INTRAMUSCULAR | Status: DC | PRN
  Administered 2024-07-27: 40 mg via INTRAVENOUS

## 2024-07-27 MED ORDER — FENTANYL CITRATE (PF) 100 MCG/2ML IJ SOLN
INTRAMUSCULAR | Status: AC
  Filled 2024-07-27: qty 2

## 2024-07-27 MED ORDER — PROPOFOL 10 MG/ML IV BOLUS
INTRAVENOUS | Status: DC | PRN
  Administered 2024-07-27: 110 mg via INTRAVENOUS

## 2024-07-27 MED ORDER — MIDAZOLAM HCL (PF) 2 MG/2ML IJ SOLN
INTRAMUSCULAR | Status: DC | PRN
  Administered 2024-07-27: 2 mg via INTRAVENOUS

## 2024-07-27 MED ORDER — LIDOCAINE VISCOUS HCL 2 % MT SOLN
15.0000 mL | Freq: Four times a day (QID) | OROMUCOSAL | 0 refills | Status: AC | PRN
  Filled 2024-07-27: qty 100, 2d supply, fill #0

## 2024-07-27 NOTE — Anesthesia Preprocedure Evaluation (Signed)
 "                                  Anesthesia Evaluation  Patient identified by MRN, date of birth, ID band Patient awake    Reviewed: Allergy & Precautions, NPO status , Patient's Chart, lab work & pertinent test results  History of Anesthesia Complications Negative for: history of anesthetic complications  Airway Mallampati: III  TM Distance: >3 FB Neck ROM: Full    Dental  (+) Teeth Intact, Dental Advisory Given   Pulmonary shortness of breath, neg COPD latent tb, lung ca   breath sounds clear to auscultation       Cardiovascular hypertension, (-) angina (-) Past MI and (-) CHF  Rhythm:Regular  1. Limited echo for pericardial effusion   2. Left ventricular ejection fraction, by estimation, is >75%. Left  ventricular ejection fraction by PLAX is 79 %. The left ventricle has  hyperdynamic function. The left ventricle has no regional wall motion  abnormalities.   3. A small pericardial effusion is present. The pericardial effusion is  circumferential. There is no evidence of cardiac tamponade.   4. The aortic valve is tricuspid. Aortic valve regurgitation is not  visualized. No aortic stenosis is present.   5. There is normal pulmonary artery systolic pressure.   6. The inferior vena cava is normal in size with greater than 50%  respiratory variability, suggesting right atrial pressure of 3 mmHg.     Neuro/Psych  Headaches PSYCHIATRIC DISORDERS Anxiety     TIA   GI/Hepatic Neg liver ROS, PUD,GERD  Controlled,,  Endo/Other  negative endocrine ROS    Renal/GU negative Renal ROS     Musculoskeletal negative musculoskeletal ROS (+)    Abdominal   Peds  Hematology  (+) Blood dyscrasia, anemia Lab Results      Component                Value               Date                      WBC                      5.4                 07/27/2024                HGB                      10.9 (L)            07/27/2024                HCT                      33.5 (L)             07/27/2024                MCV                      84.0                07/27/2024                PLT  225                 07/27/2024              Anesthesia Other Findings   Reproductive/Obstetrics                              Anesthesia Physical Anesthesia Plan  ASA: 3  Anesthesia Plan: General   Post-op Pain Management: Minimal or no pain anticipated   Induction: Intravenous  PONV Risk Score and Plan: 3 and Ondansetron  and Dexamethasone   Airway Management Planned: Oral ETT  Additional Equipment: None  Intra-op Plan:   Post-operative Plan: Extubation in OR  Informed Consent: I have reviewed the patients History and Physical, chart, labs and discussed the procedure including the risks, benefits and alternatives for the proposed anesthesia with the patient or authorized representative who has indicated his/her understanding and acceptance.     Dental advisory given  Plan Discussed with: CRNA  Anesthesia Plan Comments:          Anesthesia Quick Evaluation  "

## 2024-07-27 NOTE — Anesthesia Procedure Notes (Signed)
 Procedure Name: Intubation Date/Time: 07/27/2024 8:51 AM  Performed by: Jerl Donald LABOR, CRNAPre-anesthesia Checklist: Patient identified, Emergency Drugs available, Suction available and Patient being monitored Patient Re-evaluated:Patient Re-evaluated prior to induction Oxygen Delivery Method: Circle System Utilized Preoxygenation: Pre-oxygenation with 100% oxygen Induction Type: IV induction Ventilation: Mask ventilation without difficulty Laryngoscope Size: Mac and 3 Grade View: Grade I Tube type: Oral Tube size: 5.5 mm Number of attempts: 1 Placement Confirmation: ETT inserted through vocal cords under direct vision, positive ETCO2 and breath sounds checked- equal and bilateral Secured at: 18 cm Tube secured with: Tape Dental Injury: Teeth and Oropharynx as per pre-operative assessment

## 2024-07-27 NOTE — Discharge Instructions (Signed)
[facility] [facility] POST OP INSTRUCTIONS: LARYNGOSCOPY  The Surgery Itself Laryngoscopy with biopsy or injection involves a brief general anesthesia, typically for  less than one hour. Patients may be sedated for several hours after surgery and may  remain sleepy for the better part of the day. Nausea and vomiting are occasionally seen,  and usually resolve by the evening of surgery - even without additional medications.  Almost all patients can go home the day of surgery.  After Surgery ? You will have a sore throat from the metal instruments used to allow a good view  of your voice box for 3-5 days after surgery. Some patients may have sores on  the tongue or in the mouth. This is normal and you will be given pain  medications for this. If you have sores in the mouth, avoid citrus or acidic  foods/drinks since they will burn.  ? Avoid coughing or frequent throat clearing. Drinking water can help alleviate the  urge to clear the throat. ? Avoid any heavy lifting (more than 20 lbs), straining, exercise, or sports activities  for 2 weeks after surgery. ? Avoid alcohol, tobacco products, spicy foods, or eating late at night as these may  cause heartburn or stomach reflux and may delay the healing process. ? Your voice may be hoarse after surgery from swelling of the vocal cords caused  by manipulation. ? You do not have to avoid talking unless your surgeon gives you specific  instructions. Use your normal voice since whispering is harder on your vocal  cords then talking at a regular volume. ? If your surgeon advises voice rest, you should do the following: o You are to have absolute voice rest for 7 days. You may want to purchase  a dry erase board to communicate during this time. After that, you may  begin to use your voice at a soft spoken level. o You should only speak loud enough for people to hear you that are within  an arm's length away. Do not yell or whisper. You may increase your   voice use by 5 minutes/hour each day after the first 7 days of rest.  Medications ? Pain medication can be used for pain as prescribed. Pain in the throat and the  tongue is normal. ? Some patients will be given steroids or acid reducing medications after surgery,  take these as directed. ? Take all of your routine medications as prescribed, unless told otherwise by your  surgeon. Any medications that thin the blood should be avoided unless approved  by your surgeon.  ? IT IS OK TO TAKE OVER THE COUNTER PAIN MEDICATION  (IBUPROFEN, NAPROXEN, or ACETAMINOPHEN) IN ADDITION TO  YOUR PRESCRIBED MEDICATIONS. DO NOT TAKE ASPIRIN UNLESS  CLEARED WITH YOUR SURGEON.  ? Limit Acetaminophen/Tylenol to less than 4,000mg/day  ? Limit Ibuprofen/Motrin to less than 3,600mg/day  Final Result ? Following vocal cord injection, the voice may seem "strangled" due to swelling for  a few days or weeks. This is normal.  ? If you have a biopsy in surgery, you will usually find out the pathology results  when you are seen in the office for follow up. ? Many patients benefit from voice therapy after surgery to improve the long term  result. Your surgeon will recommend this if you could benefit from it  

## 2024-07-27 NOTE — Op Note (Signed)
 OPERATIVE NOTE  Karen [name] Date/Time of Admission: 07/27/2024  7:04 AM  CSN: [id number];MRN:[id number] Attending Provider: Llewellyn Sayres A, DO Room/Bed: MCPO/NONE DOB: 1965-10-06 Age: 59 y.o.   Pre-Op Diagnosis: Vocal cord paralysis, unilateral complete Hoarseness of voice  Post-Op Diagnosis: Vocal cord paralysis, unilateral complete Hoarseness of voice  Procedure: Procedures: MICROLARYNGOSCOPY, WITH VOCAL CORD INJECTION  Anesthesia: General  Surgeon(s): Jasmane Brockway A Dimitrius Steedman, DO  Staff: Circulator: Prentiss Shona NOVAK, RN Scrub Person: Dyane, Amy E  Implants: Implant Name Type Inv. Item Serial No. Manufacturer Lot No. LRB No. Used Action  KIT PROLARN PLUS GEL W/NDL - [id number] Prosthesis and Implant ENT KIT PROLARN PLUS GEL W/NDL  [employer] [id number] Bilateral 1 Implanted  KIT PROLARN PLUS GEL W/NDL - [id number] Prosthesis and Implant ENT KIT PROLARN PLUS GEL W/NDL  [employer] [id number] Bilateral 1 Implanted    Specimens: * No specimens in log *  Complications: None  EBL: <1 ML  Condition: stable  Operative Findings:  Normal appearing larynx with no mucosal abnormalities  Description of Operation: Once operative consent was obtained, and the surgical site confirmed with the operating room team, the patient was brought back to the operating room and general endotracheal anesthesia was obtained. The patient was turned over to the ENT service. An operating laryngoscope was used to directly visualize the upper airway and glottis. All anatomic areas from the oral cavity to the glottis were examined and noted to be normal with only exceptions noted in this report. Areas examined included the oropharynx, vallecula,both surfaces of the epiglottis, glottis, post cricoid region and bilateral pyriform sinuses.  The patient was placed in laryngeal suspension with the focus on the glottis with the ET tube intact. A zero degree endoscope was used to visualize the vocal  cords and Prolaryn Plus 0.85 ml was injected into the paraglottic space medial to the ventricle of the left true vocal cord. 0.69mL was placed lateral to the vocal process and then 0.25 mL was placed half way between the vocal process and the anterior commissure. 0.33mL was placed lateral to the vocal process of the right true vocal fold. Following this, the left true vocal fold appeared appropriately medialized. Hemostasis was obtained with an Afrin soaked pledget. The patient was turned back over to the anesthesia service. The patient was transferred to the PACU in stable condition.    Sayres DELENA Llewellyn, DO [facility]  07/27/2024

## 2024-07-27 NOTE — Transfer of Care (Signed)
 Immediate Anesthesia Transfer of Care Note  Patient: Karen [name]  Procedure(s) Performed: MICROLARYNGOSCOPY, WITH VOCAL CORD INJECTION  Patient Location: PACU  Anesthesia Type:General  Level of Consciousness: awake, oriented, and drowsy  Airway & Oxygen Therapy: Patient Spontanous Breathing  Post-op Assessment: Report given to RN, Post -op Vital signs reviewed and stable, and Patient moving all extremities  Post vital signs: Reviewed and stable  Last Vitals:  Vitals Value Taken Time  BP 106/95 07/27/24 09:37  Temp    Pulse 76 07/27/24 09:39  Resp 11 07/27/24 09:39  SpO2 100 % 07/27/24 09:39  Vitals shown include unfiled device data.  Last Pain:  Vitals:   07/27/24 0732  TempSrc:   PainSc: 7       Patients Stated Pain Goal: 1 (07/27/24 0732)  Complications: No notable events documented.
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
## 2024-08-07 ENCOUNTER — Inpatient Hospital Stay: Attending: Hematology

## 2024-09-04 ENCOUNTER — Inpatient Hospital Stay: Admitting: Hematology

## 2024-09-04 ENCOUNTER — Inpatient Hospital Stay

## 2024-09-04 ENCOUNTER — Inpatient Hospital Stay: Attending: Hematology
